# Patient Record
Sex: Female | Born: 1950 | Race: Black or African American | Hispanic: No | Smoking: Never smoker
Health system: Southern US, Community
[De-identification: ages and names within clinical notes are randomized; demographics above are authoritative.]

## PROBLEM LIST (undated history)

## (undated) DIAGNOSIS — K219 Gastro-esophageal reflux disease without esophagitis: Secondary | ICD-10-CM

## (undated) DIAGNOSIS — Z8669 Personal history of other diseases of the nervous system and sense organs: Secondary | ICD-10-CM

## (undated) DIAGNOSIS — Z9889 Other specified postprocedural states: Secondary | ICD-10-CM

## (undated) DIAGNOSIS — I10 Essential (primary) hypertension: Secondary | ICD-10-CM

## (undated) DIAGNOSIS — F419 Anxiety disorder, unspecified: Secondary | ICD-10-CM

## (undated) DIAGNOSIS — I672 Cerebral atherosclerosis: Secondary | ICD-10-CM

## (undated) DIAGNOSIS — I447 Left bundle-branch block, unspecified: Secondary | ICD-10-CM

## (undated) DIAGNOSIS — Z86018 Personal history of other benign neoplasm: Secondary | ICD-10-CM

## (undated) DIAGNOSIS — E041 Nontoxic single thyroid nodule: Secondary | ICD-10-CM

## (undated) DIAGNOSIS — I429 Cardiomyopathy, unspecified: Secondary | ICD-10-CM

## (undated) DIAGNOSIS — I209 Angina pectoris, unspecified: Secondary | ICD-10-CM

## (undated) DIAGNOSIS — Z9221 Personal history of antineoplastic chemotherapy: Secondary | ICD-10-CM

## (undated) DIAGNOSIS — G4730 Sleep apnea, unspecified: Secondary | ICD-10-CM

## (undated) DIAGNOSIS — E049 Nontoxic goiter, unspecified: Secondary | ICD-10-CM

## (undated) DIAGNOSIS — Z8742 Personal history of other diseases of the female genital tract: Secondary | ICD-10-CM

## (undated) DIAGNOSIS — Z780 Asymptomatic menopausal state: Secondary | ICD-10-CM

## (undated) DIAGNOSIS — Z95810 Presence of automatic (implantable) cardiac defibrillator: Secondary | ICD-10-CM

## (undated) DIAGNOSIS — M1990 Unspecified osteoarthritis, unspecified site: Secondary | ICD-10-CM

## (undated) DIAGNOSIS — R0600 Dyspnea, unspecified: Secondary | ICD-10-CM

## (undated) DIAGNOSIS — Z87442 Personal history of urinary calculi: Secondary | ICD-10-CM

## (undated) DIAGNOSIS — R112 Nausea with vomiting, unspecified: Secondary | ICD-10-CM

## (undated) DIAGNOSIS — J343 Hypertrophy of nasal turbinates: Secondary | ICD-10-CM

## (undated) DIAGNOSIS — J329 Chronic sinusitis, unspecified: Secondary | ICD-10-CM

## (undated) DIAGNOSIS — J189 Pneumonia, unspecified organism: Secondary | ICD-10-CM

## (undated) DIAGNOSIS — D649 Anemia, unspecified: Secondary | ICD-10-CM

## (undated) DIAGNOSIS — N393 Stress incontinence (female) (male): Secondary | ICD-10-CM

## (undated) DIAGNOSIS — D0511 Intraductal carcinoma in situ of right breast: Secondary | ICD-10-CM

## (undated) DIAGNOSIS — E785 Hyperlipidemia, unspecified: Secondary | ICD-10-CM

## (undated) DIAGNOSIS — C50912 Malignant neoplasm of unspecified site of left female breast: Secondary | ICD-10-CM

## (undated) DIAGNOSIS — E119 Type 2 diabetes mellitus without complications: Secondary | ICD-10-CM

## (undated) DIAGNOSIS — G5600 Carpal tunnel syndrome, unspecified upper limb: Secondary | ICD-10-CM

## (undated) DIAGNOSIS — Z8679 Personal history of other diseases of the circulatory system: Secondary | ICD-10-CM

## (undated) DIAGNOSIS — Z923 Personal history of irradiation: Secondary | ICD-10-CM

## (undated) HISTORY — DX: Intraductal carcinoma in situ of right breast: D05.11

## (undated) HISTORY — PX: ABDOMINAL HYSTERECTOMY: SHX81

## (undated) HISTORY — DX: Malignant neoplasm of unspecified site of left female breast: C50.912

## (undated) HISTORY — DX: Cerebral atherosclerosis: I67.2

## (undated) HISTORY — DX: Chronic sinusitis, unspecified: J32.9

## (undated) HISTORY — PX: EYE SURGERY: SHX253

## (undated) HISTORY — DX: Unspecified osteoarthritis, unspecified site: M19.90

## (undated) HISTORY — PX: CARDIAC CATHETERIZATION: SHX172

## (undated) HISTORY — DX: Stress incontinence (female) (male): N39.3

## (undated) HISTORY — PX: TUBAL LIGATION: SHX77

## (undated) HISTORY — DX: Hyperlipidemia, unspecified: E78.5

## (undated) HISTORY — PX: ROTATOR CUFF REPAIR: SHX139

## (undated) HISTORY — DX: Asymptomatic menopausal state: Z78.0

## (undated) HISTORY — PX: WISDOM TOOTH EXTRACTION: SHX21

## (undated) HISTORY — DX: Type 2 diabetes mellitus without complications: E11.9

## (undated) HISTORY — DX: Essential (primary) hypertension: I10

## (undated) HISTORY — PX: BILATERAL SALPINGOOPHORECTOMY: SHX1223

## (undated) HISTORY — DX: Left bundle-branch block, unspecified: I44.7

## (undated) HISTORY — DX: Cardiomyopathy, unspecified: I42.9

---

## 2000-09-14 ENCOUNTER — Emergency Department (HOSPITAL_COMMUNITY): Admission: EM | Admit: 2000-09-14 | Discharge: 2000-09-14 | Payer: Self-pay | Admitting: Emergency Medicine

## 2000-09-14 ENCOUNTER — Encounter: Payer: Self-pay | Admitting: Emergency Medicine

## 2000-09-23 ENCOUNTER — Encounter: Payer: Self-pay | Admitting: Obstetrics and Gynecology

## 2000-09-23 ENCOUNTER — Encounter: Admission: RE | Admit: 2000-09-23 | Discharge: 2000-09-23 | Payer: Self-pay | Admitting: Obstetrics and Gynecology

## 2003-06-16 ENCOUNTER — Encounter: Admission: RE | Admit: 2003-06-16 | Discharge: 2003-06-16 | Payer: Self-pay | Admitting: Internal Medicine

## 2003-06-16 ENCOUNTER — Encounter: Payer: Self-pay | Admitting: Internal Medicine

## 2004-10-22 HISTORY — PX: BREAST LUMPECTOMY: SHX2

## 2005-02-02 ENCOUNTER — Encounter: Admission: RE | Admit: 2005-02-02 | Discharge: 2005-02-02 | Payer: Self-pay | Admitting: Internal Medicine

## 2005-02-02 ENCOUNTER — Encounter (INDEPENDENT_AMBULATORY_CARE_PROVIDER_SITE_OTHER): Payer: Self-pay | Admitting: *Deleted

## 2005-02-02 ENCOUNTER — Encounter (INDEPENDENT_AMBULATORY_CARE_PROVIDER_SITE_OTHER): Payer: Self-pay | Admitting: Radiology

## 2005-02-11 ENCOUNTER — Encounter: Admission: RE | Admit: 2005-02-11 | Discharge: 2005-02-11 | Payer: Self-pay | Admitting: General Surgery

## 2005-02-13 ENCOUNTER — Ambulatory Visit: Payer: Self-pay | Admitting: Oncology

## 2005-02-22 ENCOUNTER — Ambulatory Visit (HOSPITAL_BASED_OUTPATIENT_CLINIC_OR_DEPARTMENT_OTHER): Admission: RE | Admit: 2005-02-22 | Discharge: 2005-02-22 | Payer: Self-pay | Admitting: General Surgery

## 2005-02-22 ENCOUNTER — Ambulatory Visit (HOSPITAL_COMMUNITY): Admission: RE | Admit: 2005-02-22 | Discharge: 2005-02-22 | Payer: Self-pay | Admitting: General Surgery

## 2005-02-23 ENCOUNTER — Ambulatory Visit (HOSPITAL_COMMUNITY): Admission: RE | Admit: 2005-02-23 | Discharge: 2005-02-23 | Payer: Self-pay | Admitting: Oncology

## 2005-02-28 ENCOUNTER — Ambulatory Visit: Payer: Self-pay

## 2005-04-04 ENCOUNTER — Encounter: Admission: RE | Admit: 2005-04-04 | Discharge: 2005-04-04 | Payer: Self-pay | Admitting: Oncology

## 2005-04-04 ENCOUNTER — Ambulatory Visit: Payer: Self-pay | Admitting: Oncology

## 2005-04-25 ENCOUNTER — Encounter: Admission: RE | Admit: 2005-04-25 | Discharge: 2005-04-25 | Payer: Self-pay | Admitting: Oncology

## 2005-05-24 ENCOUNTER — Ambulatory Visit: Payer: Self-pay | Admitting: Oncology

## 2005-07-05 ENCOUNTER — Encounter: Admission: RE | Admit: 2005-07-05 | Discharge: 2005-07-05 | Payer: Self-pay | Admitting: Oncology

## 2005-08-02 ENCOUNTER — Encounter: Admission: RE | Admit: 2005-08-02 | Discharge: 2005-08-02 | Payer: Self-pay | Admitting: General Surgery

## 2005-08-03 ENCOUNTER — Ambulatory Visit (HOSPITAL_BASED_OUTPATIENT_CLINIC_OR_DEPARTMENT_OTHER): Admission: RE | Admit: 2005-08-03 | Discharge: 2005-08-03 | Payer: Self-pay | Admitting: General Surgery

## 2005-08-03 ENCOUNTER — Ambulatory Visit: Payer: Self-pay | Admitting: Oncology

## 2005-08-03 ENCOUNTER — Ambulatory Visit (HOSPITAL_COMMUNITY): Admission: RE | Admit: 2005-08-03 | Discharge: 2005-08-03 | Payer: Self-pay | Admitting: General Surgery

## 2005-08-03 ENCOUNTER — Encounter: Admission: RE | Admit: 2005-08-03 | Discharge: 2005-08-03 | Payer: Self-pay | Admitting: General Surgery

## 2005-08-03 ENCOUNTER — Encounter (INDEPENDENT_AMBULATORY_CARE_PROVIDER_SITE_OTHER): Payer: Self-pay | Admitting: *Deleted

## 2005-09-12 ENCOUNTER — Ambulatory Visit: Admission: RE | Admit: 2005-09-12 | Discharge: 2005-12-06 | Payer: Self-pay | Admitting: Radiation Oncology

## 2005-09-20 ENCOUNTER — Ambulatory Visit (HOSPITAL_BASED_OUTPATIENT_CLINIC_OR_DEPARTMENT_OTHER): Admission: RE | Admit: 2005-09-20 | Discharge: 2005-09-20 | Payer: Self-pay | Admitting: General Surgery

## 2005-11-14 ENCOUNTER — Ambulatory Visit: Payer: Self-pay | Admitting: Oncology

## 2006-02-04 ENCOUNTER — Encounter: Admission: RE | Admit: 2006-02-04 | Discharge: 2006-02-04 | Payer: Self-pay | Admitting: Oncology

## 2006-02-19 ENCOUNTER — Ambulatory Visit: Payer: Self-pay | Admitting: Oncology

## 2006-02-21 LAB — CBC WITH DIFFERENTIAL/PLATELET
BASO%: 0.3 % (ref 0.0–2.0)
Eosinophils Absolute: 0.1 10*3/uL (ref 0.0–0.5)
HCT: 39.3 % (ref 34.8–46.6)
MCHC: 34.5 g/dL (ref 32.0–36.0)
MONO#: 0.4 10*3/uL (ref 0.1–0.9)
NEUT#: 1.5 10*3/uL (ref 1.5–6.5)
NEUT%: 44.5 % (ref 39.6–76.8)
WBC: 3.3 10*3/uL — ABNORMAL LOW (ref 3.9–10.0)
lymph#: 1.3 10*3/uL (ref 0.9–3.3)

## 2006-02-21 LAB — COMPREHENSIVE METABOLIC PANEL
ALT: 21 U/L (ref 0–40)
CO2: 27 mEq/L (ref 19–32)
Calcium: 9.9 mg/dL (ref 8.4–10.5)
Chloride: 105 mEq/L (ref 96–112)
Creatinine, Ser: 0.7 mg/dL (ref 0.4–1.2)
Glucose, Bld: 113 mg/dL — ABNORMAL HIGH (ref 70–99)
Sodium: 141 mEq/L (ref 135–145)
Total Protein: 7.5 g/dL (ref 6.0–8.3)

## 2006-02-21 LAB — CANCER ANTIGEN 27.29: CA 27.29: 29 U/mL (ref 0–39)

## 2006-02-21 LAB — LACTATE DEHYDROGENASE: LDH: 138 U/L (ref 94–250)

## 2006-06-18 ENCOUNTER — Ambulatory Visit: Payer: Self-pay | Admitting: Oncology

## 2006-06-20 LAB — CBC WITH DIFFERENTIAL/PLATELET
BASO%: 0.3 % (ref 0.0–2.0)
Basophils Absolute: 0 10*3/uL (ref 0.0–0.1)
Eosinophils Absolute: 0.1 10*3/uL (ref 0.0–0.5)
HCT: 39.6 % (ref 34.8–46.6)
HGB: 13.5 g/dL (ref 11.6–15.9)
MONO#: 0.4 10*3/uL (ref 0.1–0.9)
NEUT#: 2.2 10*3/uL (ref 1.5–6.5)
NEUT%: 51.7 % (ref 39.6–76.8)
WBC: 4.2 10*3/uL (ref 3.9–10.0)
lymph#: 1.5 10*3/uL (ref 0.9–3.3)

## 2006-06-20 LAB — COMPREHENSIVE METABOLIC PANEL
Alkaline Phosphatase: 103 U/L (ref 39–117)
BUN: 6 mg/dL (ref 6–23)
CO2: 25 mEq/L (ref 19–32)
Creatinine, Ser: 0.69 mg/dL (ref 0.40–1.20)
Glucose, Bld: 109 mg/dL — ABNORMAL HIGH (ref 70–99)
Sodium: 141 mEq/L (ref 135–145)
Total Bilirubin: 0.5 mg/dL (ref 0.3–1.2)
Total Protein: 7.8 g/dL (ref 6.0–8.3)

## 2006-06-20 LAB — CANCER ANTIGEN 27.29: CA 27.29: 27 U/mL (ref 0–39)

## 2006-07-30 ENCOUNTER — Ambulatory Visit (HOSPITAL_COMMUNITY): Admission: RE | Admit: 2006-07-30 | Discharge: 2006-07-30 | Payer: Self-pay | Admitting: Oncology

## 2006-08-23 ENCOUNTER — Ambulatory Visit (HOSPITAL_BASED_OUTPATIENT_CLINIC_OR_DEPARTMENT_OTHER): Admission: RE | Admit: 2006-08-23 | Discharge: 2006-08-23 | Payer: Self-pay | Admitting: Orthopedic Surgery

## 2006-11-26 ENCOUNTER — Ambulatory Visit: Payer: Self-pay | Admitting: Oncology

## 2006-11-29 LAB — CBC WITH DIFFERENTIAL/PLATELET
Basophils Absolute: 0 10*3/uL (ref 0.0–0.1)
EOS%: 1.9 % (ref 0.0–7.0)
HCT: 41.1 % (ref 34.8–46.6)
HGB: 14.6 g/dL (ref 11.6–15.9)
MCH: 30.9 pg (ref 26.0–34.0)
NEUT%: 40.9 % (ref 39.6–76.8)
lymph#: 1.7 10*3/uL (ref 0.9–3.3)

## 2006-11-29 LAB — COMPREHENSIVE METABOLIC PANEL
AST: 22 U/L (ref 0–37)
BUN: 4 mg/dL — ABNORMAL LOW (ref 6–23)
CO2: 26 mEq/L (ref 19–32)
Calcium: 10.1 mg/dL (ref 8.4–10.5)
Chloride: 102 mEq/L (ref 96–112)
Creatinine, Ser: 0.61 mg/dL (ref 0.40–1.20)

## 2006-11-29 LAB — CANCER ANTIGEN 27.29: CA 27.29: 34 U/mL (ref 0–39)

## 2006-11-29 LAB — LACTATE DEHYDROGENASE: LDH: 136 U/L (ref 94–250)

## 2007-01-23 ENCOUNTER — Emergency Department (HOSPITAL_COMMUNITY): Admission: EM | Admit: 2007-01-23 | Discharge: 2007-01-23 | Payer: Self-pay | Admitting: Family Medicine

## 2007-02-06 ENCOUNTER — Encounter: Admission: RE | Admit: 2007-02-06 | Discharge: 2007-02-06 | Payer: Self-pay | Admitting: Oncology

## 2007-02-26 ENCOUNTER — Ambulatory Visit: Payer: Self-pay | Admitting: Oncology

## 2007-06-18 ENCOUNTER — Ambulatory Visit: Payer: Self-pay | Admitting: Oncology

## 2007-06-20 LAB — CBC WITH DIFFERENTIAL/PLATELET
Eosinophils Absolute: 0.1 10*3/uL (ref 0.0–0.5)
MONO#: 0.3 10*3/uL (ref 0.1–0.9)
NEUT#: 2.3 10*3/uL (ref 1.5–6.5)
RBC: 4.72 10*6/uL (ref 3.70–5.32)
RDW: 12.5 % (ref 11.3–14.5)
WBC: 4.8 10*3/uL (ref 3.9–10.0)
lymph#: 2.1 10*3/uL (ref 0.9–3.3)

## 2007-06-20 LAB — COMPREHENSIVE METABOLIC PANEL
ALT: 13 U/L (ref 0–35)
CO2: 25 mEq/L (ref 19–32)
Calcium: 10.1 mg/dL (ref 8.4–10.5)
Chloride: 101 mEq/L (ref 96–112)
Potassium: 4.1 mEq/L (ref 3.5–5.3)
Sodium: 136 mEq/L (ref 135–145)
Total Protein: 7.9 g/dL (ref 6.0–8.3)

## 2007-06-20 LAB — LACTATE DEHYDROGENASE: LDH: 123 U/L (ref 94–250)

## 2007-07-21 ENCOUNTER — Ambulatory Visit: Payer: Self-pay | Admitting: Internal Medicine

## 2007-07-21 ENCOUNTER — Ambulatory Visit: Payer: Self-pay | Admitting: *Deleted

## 2007-10-14 ENCOUNTER — Ambulatory Visit: Payer: Self-pay | Admitting: Oncology

## 2007-10-23 HISTORY — PX: BREAST LUMPECTOMY: SHX2

## 2007-10-23 HISTORY — PX: BREAST BIOPSY: SHX20

## 2007-11-05 ENCOUNTER — Ambulatory Visit: Payer: Self-pay | Admitting: Internal Medicine

## 2007-11-12 LAB — COMPREHENSIVE METABOLIC PANEL
AST: 14 U/L (ref 0–37)
Albumin: 4.7 g/dL (ref 3.5–5.2)
Alkaline Phosphatase: 103 U/L (ref 39–117)
BUN: 10 mg/dL (ref 6–23)
Potassium: 3.8 mEq/L (ref 3.5–5.3)
Total Bilirubin: 0.3 mg/dL (ref 0.3–1.2)

## 2007-11-12 LAB — CBC WITH DIFFERENTIAL/PLATELET
Basophils Absolute: 0 10*3/uL (ref 0.0–0.1)
EOS%: 3 % (ref 0.0–7.0)
HGB: 14 g/dL (ref 11.6–15.9)
LYMPH%: 47.4 % (ref 14.0–48.0)
MCH: 30.2 pg (ref 26.0–34.0)
MCV: 86.2 fL (ref 81.0–101.0)
MONO%: 8.4 % (ref 0.0–13.0)
Platelets: 292 10*3/uL (ref 145–400)
RBC: 4.65 10*6/uL (ref 3.70–5.32)
RDW: 12.7 % (ref 11.3–14.5)

## 2007-11-12 LAB — CANCER ANTIGEN 27.29: CA 27.29: 32 U/mL (ref 0–39)

## 2008-02-13 ENCOUNTER — Encounter: Admission: RE | Admit: 2008-02-13 | Discharge: 2008-02-13 | Payer: Self-pay | Admitting: Oncology

## 2008-02-16 ENCOUNTER — Encounter: Admission: RE | Admit: 2008-02-16 | Discharge: 2008-02-16 | Payer: Self-pay | Admitting: Oncology

## 2008-02-27 ENCOUNTER — Encounter: Admission: RE | Admit: 2008-02-27 | Discharge: 2008-02-27 | Payer: Self-pay | Admitting: Oncology

## 2008-02-27 ENCOUNTER — Encounter (INDEPENDENT_AMBULATORY_CARE_PROVIDER_SITE_OTHER): Payer: Self-pay | Admitting: Diagnostic Radiology

## 2008-03-09 ENCOUNTER — Ambulatory Visit: Payer: Self-pay | Admitting: Oncology

## 2008-03-11 LAB — COMPREHENSIVE METABOLIC PANEL
AST: 15 U/L (ref 0–37)
Alkaline Phosphatase: 94 U/L (ref 39–117)
BUN: 14 mg/dL (ref 6–23)
Glucose, Bld: 129 mg/dL — ABNORMAL HIGH (ref 70–99)
Total Bilirubin: 0.4 mg/dL (ref 0.3–1.2)

## 2008-03-11 LAB — CBC WITH DIFFERENTIAL/PLATELET
Basophils Absolute: 0 10*3/uL (ref 0.0–0.1)
EOS%: 1.7 % (ref 0.0–7.0)
Eosinophils Absolute: 0.1 10*3/uL (ref 0.0–0.5)
HGB: 13.9 g/dL (ref 11.6–15.9)
LYMPH%: 49.5 % — ABNORMAL HIGH (ref 14.0–48.0)
MCH: 29.9 pg (ref 26.0–34.0)
MCV: 86.7 fL (ref 81.0–101.0)
MONO%: 6.7 % (ref 0.0–13.0)
NEUT#: 1.8 10*3/uL (ref 1.5–6.5)
Platelets: 280 10*3/uL (ref 145–400)
RBC: 4.63 10*6/uL (ref 3.70–5.32)

## 2008-03-11 LAB — CANCER ANTIGEN 27.29: CA 27.29: 38 U/mL (ref 0–39)

## 2008-03-19 ENCOUNTER — Encounter: Admission: RE | Admit: 2008-03-19 | Discharge: 2008-03-19 | Payer: Self-pay | Admitting: General Surgery

## 2008-03-22 ENCOUNTER — Ambulatory Visit (HOSPITAL_BASED_OUTPATIENT_CLINIC_OR_DEPARTMENT_OTHER): Admission: RE | Admit: 2008-03-22 | Discharge: 2008-03-22 | Payer: Self-pay | Admitting: General Surgery

## 2008-03-22 ENCOUNTER — Encounter (HOSPITAL_BASED_OUTPATIENT_CLINIC_OR_DEPARTMENT_OTHER): Payer: Self-pay | Admitting: General Surgery

## 2008-03-22 ENCOUNTER — Encounter: Admission: RE | Admit: 2008-03-22 | Discharge: 2008-03-22 | Payer: Self-pay | Admitting: General Surgery

## 2008-04-15 ENCOUNTER — Ambulatory Visit: Admission: RE | Admit: 2008-04-15 | Discharge: 2008-07-13 | Payer: Self-pay | Admitting: Radiation Oncology

## 2008-04-28 ENCOUNTER — Ambulatory Visit: Payer: Self-pay | Admitting: Oncology

## 2008-06-22 ENCOUNTER — Ambulatory Visit: Payer: Self-pay | Admitting: Oncology

## 2008-06-22 LAB — CBC WITH DIFFERENTIAL/PLATELET
Basophils Absolute: 0 10*3/uL (ref 0.0–0.1)
EOS%: 2.8 % (ref 0.0–7.0)
HCT: 39.8 % (ref 34.8–46.6)
HGB: 13.9 g/dL (ref 11.6–15.9)
MCH: 30.6 pg (ref 26.0–34.0)
MONO#: 0.3 10*3/uL (ref 0.1–0.9)
NEUT#: 1.4 10*3/uL — ABNORMAL LOW (ref 1.5–6.5)
NEUT%: 57.2 % (ref 39.6–76.8)
RDW: 12.5 % (ref 11.3–14.5)
WBC: 2.5 10*3/uL — ABNORMAL LOW (ref 3.9–10.0)
lymph#: 0.6 10*3/uL — ABNORMAL LOW (ref 0.9–3.3)

## 2008-06-23 LAB — COMPREHENSIVE METABOLIC PANEL
ALT: 17 U/L (ref 0–35)
AST: 16 U/L (ref 0–37)
Albumin: 4.6 g/dL (ref 3.5–5.2)
BUN: 7 mg/dL (ref 6–23)
CO2: 23 mEq/L (ref 19–32)
Calcium: 9.6 mg/dL (ref 8.4–10.5)
Chloride: 104 mEq/L (ref 96–112)
Creatinine, Ser: 0.61 mg/dL (ref 0.40–1.20)
Potassium: 4.1 mEq/L (ref 3.5–5.3)

## 2008-06-23 LAB — VITAMIN D 25 HYDROXY (VIT D DEFICIENCY, FRACTURES): Vit D, 25-Hydroxy: 24 ng/mL — ABNORMAL LOW (ref 30–89)

## 2008-06-23 LAB — LACTATE DEHYDROGENASE: LDH: 132 U/L (ref 94–250)

## 2008-06-25 ENCOUNTER — Ambulatory Visit (HOSPITAL_COMMUNITY): Admission: RE | Admit: 2008-06-25 | Discharge: 2008-06-25 | Payer: Self-pay | Admitting: Obstetrics and Gynecology

## 2008-06-25 ENCOUNTER — Encounter (INDEPENDENT_AMBULATORY_CARE_PROVIDER_SITE_OTHER): Payer: Self-pay | Admitting: Obstetrics and Gynecology

## 2008-07-14 ENCOUNTER — Encounter: Admission: RE | Admit: 2008-07-14 | Discharge: 2008-07-14 | Payer: Self-pay | Admitting: Oncology

## 2008-08-09 ENCOUNTER — Emergency Department (HOSPITAL_COMMUNITY): Admission: EM | Admit: 2008-08-09 | Discharge: 2008-08-09 | Payer: Self-pay | Admitting: Emergency Medicine

## 2008-10-08 ENCOUNTER — Ambulatory Visit: Payer: Self-pay | Admitting: Oncology

## 2008-10-18 LAB — CBC WITH DIFFERENTIAL/PLATELET
BASO%: 0.4 % (ref 0.0–2.0)
EOS%: 4.9 % (ref 0.0–7.0)
HCT: 39.7 % (ref 34.8–46.6)
LYMPH%: 33.4 % (ref 14.0–48.0)
MCH: 30.6 pg (ref 26.0–34.0)
MCHC: 34.5 g/dL (ref 32.0–36.0)
MONO#: 0.4 10*3/uL (ref 0.1–0.9)
MONO%: 10.4 % (ref 0.0–13.0)
NEUT%: 50.9 % (ref 39.6–76.8)
Platelets: 291 10*3/uL (ref 145–400)
RBC: 4.48 10*6/uL (ref 3.70–5.32)
WBC: 3.7 10*3/uL — ABNORMAL LOW (ref 3.9–10.0)

## 2008-10-19 LAB — LACTATE DEHYDROGENASE: LDH: 136 U/L (ref 94–250)

## 2008-10-19 LAB — COMPREHENSIVE METABOLIC PANEL
ALT: 31 U/L (ref 0–35)
AST: 22 U/L (ref 0–37)
Alkaline Phosphatase: 101 U/L (ref 39–117)
CO2: 27 mEq/L (ref 19–32)
Creatinine, Ser: 0.64 mg/dL (ref 0.40–1.20)
Sodium: 138 mEq/L (ref 135–145)
Total Bilirubin: 0.4 mg/dL (ref 0.3–1.2)
Total Protein: 7.7 g/dL (ref 6.0–8.3)

## 2008-10-19 LAB — VITAMIN D 25 HYDROXY (VIT D DEFICIENCY, FRACTURES): Vit D, 25-Hydroxy: 25 ng/mL — ABNORMAL LOW (ref 30–89)

## 2008-11-09 ENCOUNTER — Encounter (INDEPENDENT_AMBULATORY_CARE_PROVIDER_SITE_OTHER): Payer: Self-pay | Admitting: Internal Medicine

## 2008-11-09 ENCOUNTER — Ambulatory Visit: Payer: Self-pay | Admitting: Internal Medicine

## 2008-11-09 LAB — CONVERTED CEMR LAB
ALT: 14 units/L (ref 0–35)
AST: 15 units/L (ref 0–37)
Albumin: 4.9 g/dL (ref 3.5–5.2)
CO2: 32 meq/L (ref 19–32)
Calcium: 10.6 mg/dL — ABNORMAL HIGH (ref 8.4–10.5)
Chloride: 104 meq/L (ref 96–112)
Potassium: 4.2 meq/L (ref 3.5–5.3)
Sodium: 143 meq/L (ref 135–145)
Total Protein: 8.2 g/dL (ref 6.0–8.3)

## 2009-02-15 ENCOUNTER — Encounter: Admission: RE | Admit: 2009-02-15 | Discharge: 2009-02-15 | Payer: Self-pay | Admitting: Oncology

## 2009-02-16 ENCOUNTER — Ambulatory Visit (HOSPITAL_COMMUNITY): Admission: RE | Admit: 2009-02-16 | Discharge: 2009-02-16 | Payer: Self-pay | Admitting: Oncology

## 2009-04-26 ENCOUNTER — Ambulatory Visit: Payer: Self-pay | Admitting: Oncology

## 2009-04-28 LAB — CBC WITH DIFFERENTIAL/PLATELET
BASO%: 0.5 % (ref 0.0–2.0)
Basophils Absolute: 0 10*3/uL (ref 0.0–0.1)
EOS%: 3.4 % (ref 0.0–7.0)
HGB: 13.3 g/dL (ref 11.6–15.9)
MCH: 29.4 pg (ref 25.1–34.0)
MCHC: 33.3 g/dL (ref 31.5–36.0)
MCV: 88.1 fL (ref 79.5–101.0)
MONO%: 10.1 % (ref 0.0–14.0)
RBC: 4.53 10*6/uL (ref 3.70–5.45)
RDW: 12.6 % (ref 11.2–14.5)
WBC: 4.4 10*3/uL (ref 3.9–10.3)
lymph#: 1.6 10*3/uL (ref 0.9–3.3)
nRBC: 0 % (ref 0–0)

## 2009-04-29 LAB — CANCER ANTIGEN 27.29: CA 27.29: 35 U/mL (ref 0–39)

## 2009-04-29 LAB — COMPREHENSIVE METABOLIC PANEL
ALT: 15 U/L (ref 0–35)
Alkaline Phosphatase: 91 U/L (ref 39–117)
CO2: 24 mEq/L (ref 19–32)
Creatinine, Ser: 0.67 mg/dL (ref 0.40–1.20)
Sodium: 138 mEq/L (ref 135–145)
Total Bilirubin: 0.3 mg/dL (ref 0.3–1.2)
Total Protein: 7.8 g/dL (ref 6.0–8.3)

## 2009-08-24 ENCOUNTER — Ambulatory Visit: Payer: Self-pay | Admitting: Oncology

## 2009-08-31 ENCOUNTER — Encounter: Admission: RE | Admit: 2009-08-31 | Discharge: 2009-08-31 | Payer: Self-pay | Admitting: Oncology

## 2009-10-22 HISTORY — PX: COLONOSCOPY: SHX174

## 2009-12-02 ENCOUNTER — Ambulatory Visit: Payer: Self-pay | Admitting: Oncology

## 2009-12-06 LAB — CBC WITH DIFFERENTIAL/PLATELET
Eosinophils Absolute: 0.1 10*3/uL (ref 0.0–0.5)
HCT: 40.6 % (ref 34.8–46.6)
LYMPH%: 45.6 % (ref 14.0–49.7)
MCV: 89.5 fL (ref 79.5–101.0)
MONO#: 0.4 10*3/uL (ref 0.1–0.9)
MONO%: 6.6 % (ref 0.0–14.0)
NEUT#: 2.9 10*3/uL (ref 1.5–6.5)
NEUT%: 45.9 % (ref 38.4–76.8)
Platelets: 267 10*3/uL (ref 145–400)
WBC: 6.2 10*3/uL (ref 3.9–10.3)

## 2009-12-06 LAB — COMPREHENSIVE METABOLIC PANEL
BUN: 10 mg/dL (ref 6–23)
CO2: 28 mEq/L (ref 19–32)
Creatinine, Ser: 0.62 mg/dL (ref 0.40–1.20)
Glucose, Bld: 107 mg/dL — ABNORMAL HIGH (ref 70–99)
Total Bilirubin: 0.5 mg/dL (ref 0.3–1.2)

## 2009-12-06 LAB — VITAMIN D 25 HYDROXY (VIT D DEFICIENCY, FRACTURES): Vit D, 25-Hydroxy: 29 ng/mL — ABNORMAL LOW (ref 30–89)

## 2009-12-06 LAB — CANCER ANTIGEN 27.29: CA 27.29: 30 U/mL (ref 0–39)

## 2009-12-06 LAB — LACTATE DEHYDROGENASE: LDH: 128 U/L (ref 94–250)

## 2010-01-13 ENCOUNTER — Ambulatory Visit: Payer: Self-pay | Admitting: Oncology

## 2010-05-22 ENCOUNTER — Encounter: Admission: RE | Admit: 2010-05-22 | Discharge: 2010-05-22 | Payer: Self-pay | Admitting: Podiatry

## 2010-06-09 ENCOUNTER — Ambulatory Visit (HOSPITAL_COMMUNITY): Admission: RE | Admit: 2010-06-09 | Discharge: 2010-06-09 | Payer: Self-pay | Admitting: Obstetrics and Gynecology

## 2010-07-13 ENCOUNTER — Ambulatory Visit: Payer: Self-pay | Admitting: Oncology

## 2010-07-14 ENCOUNTER — Encounter: Admission: RE | Admit: 2010-07-14 | Discharge: 2010-07-14 | Payer: Self-pay | Admitting: Oncology

## 2010-07-17 LAB — CBC WITH DIFFERENTIAL/PLATELET
BASO%: 0.7 % (ref 0.0–2.0)
Basophils Absolute: 0 10e3/uL (ref 0.0–0.1)
EOS%: 2.5 % (ref 0.0–7.0)
Eosinophils Absolute: 0.1 10e3/uL (ref 0.0–0.5)
HCT: 38.5 % (ref 34.8–46.6)
HGB: 13.4 g/dL (ref 11.6–15.9)
LYMPH%: 48.7 % (ref 14.0–49.7)
MCH: 30.9 pg (ref 25.1–34.0)
MCHC: 34.8 g/dL (ref 31.5–36.0)
MCV: 88.9 fL (ref 79.5–101.0)
MONO#: 0.5 10e3/uL (ref 0.1–0.9)
MONO%: 9.9 % (ref 0.0–14.0)
NEUT#: 2 10e3/uL (ref 1.5–6.5)
NEUT%: 38.2 % — ABNORMAL LOW (ref 38.4–76.8)
Platelets: 277 10e3/uL (ref 145–400)
RBC: 4.33 10e6/uL (ref 3.70–5.45)
RDW: 12.7 % (ref 11.2–14.5)
WBC: 5.1 10e3/uL (ref 3.9–10.3)
lymph#: 2.5 10e3/uL (ref 0.9–3.3)

## 2010-07-18 LAB — COMPREHENSIVE METABOLIC PANEL
ALT: 13 U/L (ref 0–35)
AST: 16 U/L (ref 0–37)
CO2: 22 mEq/L (ref 19–32)
Calcium: 9.7 mg/dL (ref 8.4–10.5)
Chloride: 107 mEq/L (ref 96–112)
Creatinine, Ser: 0.54 mg/dL (ref 0.40–1.20)
Sodium: 141 mEq/L (ref 135–145)
Total Bilirubin: 0.3 mg/dL (ref 0.3–1.2)
Total Protein: 7.1 g/dL (ref 6.0–8.3)

## 2010-09-04 ENCOUNTER — Encounter: Admission: RE | Admit: 2010-09-04 | Discharge: 2010-09-04 | Payer: Self-pay | Admitting: Oncology

## 2010-10-30 ENCOUNTER — Ambulatory Visit: Payer: Self-pay | Admitting: Oncology

## 2010-11-11 ENCOUNTER — Encounter: Payer: Self-pay | Admitting: Oncology

## 2010-11-12 ENCOUNTER — Encounter: Payer: Self-pay | Admitting: Oncology

## 2011-01-05 LAB — CBC
MCH: 30.8 pg (ref 26.0–34.0)
Platelets: 245 10*3/uL (ref 150–400)
RBC: 4.45 MIL/uL (ref 3.87–5.11)
WBC: 5.3 10*3/uL (ref 4.0–10.5)

## 2011-01-19 ENCOUNTER — Encounter (HOSPITAL_BASED_OUTPATIENT_CLINIC_OR_DEPARTMENT_OTHER): Payer: 59 | Admitting: Oncology

## 2011-01-19 ENCOUNTER — Other Ambulatory Visit: Payer: Self-pay | Admitting: Physician Assistant

## 2011-01-19 DIAGNOSIS — Z170 Estrogen receptor positive status [ER+]: Secondary | ICD-10-CM

## 2011-01-19 DIAGNOSIS — D0590 Unspecified type of carcinoma in situ of unspecified breast: Secondary | ICD-10-CM

## 2011-01-19 LAB — CBC WITH DIFFERENTIAL/PLATELET
BASO%: 0.2 % (ref 0.0–2.0)
HCT: 39.6 % (ref 34.8–46.6)
MCHC: 33.1 g/dL (ref 31.5–36.0)
MONO#: 0.3 10*3/uL (ref 0.1–0.9)
NEUT#: 2 10*3/uL (ref 1.5–6.5)
NEUT%: 43.8 % (ref 38.4–76.8)
WBC: 4.5 10*3/uL (ref 3.9–10.3)
lymph#: 2.1 10*3/uL (ref 0.9–3.3)

## 2011-01-20 LAB — COMPREHENSIVE METABOLIC PANEL
ALT: 18 U/L (ref 0–35)
CO2: 24 mEq/L (ref 19–32)
Calcium: 9.5 mg/dL (ref 8.4–10.5)
Chloride: 104 mEq/L (ref 96–112)
Creatinine, Ser: 0.82 mg/dL (ref 0.40–1.20)
Sodium: 140 mEq/L (ref 135–145)
Total Protein: 7.7 g/dL (ref 6.0–8.3)

## 2011-01-20 LAB — LACTATE DEHYDROGENASE: LDH: 123 U/L (ref 94–250)

## 2011-01-22 ENCOUNTER — Other Ambulatory Visit: Payer: Self-pay | Admitting: Oncology

## 2011-01-22 ENCOUNTER — Encounter (HOSPITAL_BASED_OUTPATIENT_CLINIC_OR_DEPARTMENT_OTHER): Payer: 59 | Admitting: Oncology

## 2011-01-22 DIAGNOSIS — Z9889 Other specified postprocedural states: Secondary | ICD-10-CM

## 2011-01-22 DIAGNOSIS — C50219 Malignant neoplasm of upper-inner quadrant of unspecified female breast: Secondary | ICD-10-CM

## 2011-01-22 DIAGNOSIS — C50419 Malignant neoplasm of upper-outer quadrant of unspecified female breast: Secondary | ICD-10-CM

## 2011-01-22 DIAGNOSIS — D0590 Unspecified type of carcinoma in situ of unspecified breast: Secondary | ICD-10-CM

## 2011-03-06 NOTE — Op Note (Signed)
NAME:  Jordan Willis, GRUPP                  ACCOUNT NO.:  [id number]   MEDICAL RECORD NO.:  [id number]          PATIENT TYPE:  AMB   LOCATION:  SDC                           FACILITY:  [facility]   PHYSICIAN:  Michelle L. Grewal, M.D.DATE OF BIRTH:  Dec 06, 1950   DATE OF PROCEDURE:  06/25/2008  DATE OF DISCHARGE:  06/25/2008                               OPERATIVE REPORT   PREOPERATIVE DIAGNOSES:  1. History of breast cancer.  2. BRCA2 mutation positive.  3. Desires prophylactic oophorectomy.   POSTOPERATIVE DIAGNOSES:  1. History of breast cancer.  2. BRCA2 mutation positive.  3. Desires prophylactic oophorectomy.   PROCEDURE:  Laparoscopic bilateral salpingo-oophorectomy.   SURGEON:  Michelle L. Vincente Poli, MD   ANESTHESIA:  General.   ESTIMATED BLOOD LOSS:  Less than 100 mL   PROCEDURE:  The patient was taken to the operating room after informed  consent was obtained.  She was prepped and draped in usual sterile  fashion after she was intubated.  The bladder was emptied with a Foley  catheter.  An uterine manipulator was inserted.  Attention was turned to  the abdomen where a small infraumbilical incision was made with a  scalpel.  Veress needle was inserted, pneumoperitoneum was performed.  An 11-mm trocar was inserted.  Under direct visualization, a 5 mm trocar  was inserted suprapubically.  Exam of the abdomen and pelvis revealed  the following.  She had much enlarged uterus, which we knew by  preoperative ultrasound.  Her ovaries were deep in the pelvis and there  were adhesions over both of them.  I proceeded on the right side in the  following fashion by identifying the round ligament going over findings  the triple pedicle, elevating the right fallopian tube, and visualized  in the right ovary.  I freed some of the adhesions, which involved the  ovary to the fibroid posteriorly using sharp and blunt dissection.  I  was then able to free the ovary, then elevated the ovary, identified  the  ureter, which was deep in the pelvis.  I placed a gyrus instrument  across the infundibular pelvic ligament on the right side and carried it  down all the way across the mesosalpinx over to the triple pedicle.  Once the ovary and tube were released, I noted there was no bleeding and  normal ureteral peristalsis was noted.  This was done in identical  fashion on the left side.  There were likewise adhesions as well that we  had to free up before we could remove the ovary and tube.  An Endobag  was placed through the suprapubic site which was then enlarged to fit a  10 and the specimens were removed with Endobag.  The pneumoperitoneum  was released after an excellent hemostasis was noted.  The incisions  were closed with Dermabond skin adhesive.  All sponge, lap, and  instrument counts were correct x2.  The patient was extubated without  difficulty, went to recovery room in stable condition.   PATHOLOGY:  Bilateral ovaries and tubes.   COMPLICATIONS:  None.  Michelle L. Vincente Poli, M.D.  Electronically Signed     MLG/MEDQ  D:  07/26/2008  T:  07/26/2008  Job:  [id number]

## 2011-03-06 NOTE — Op Note (Signed)
NAME:  MADELYN, TLATELPA                  ACCOUNT NO.:  [id number]   MEDICAL RECORD NO.:  [id number]          PATIENT TYPE:  AMB   LOCATION:  DSC                          FACILITY:  [facility]   PHYSICIAN:  Leonie Man, M.D.   DATE OF BIRTH:  Mar 22, 1951   DATE OF PROCEDURE:  03/22/2008  DATE OF DISCHARGE:                               OPERATIVE REPORT   PREOPERATIVE DIAGNOSIS:  Ductal carcinoma in situ right breast.   POSTOPERATIVE DIAGNOSIS:  Ductal carcinoma in situ right breast.   PROCEDURE:  Needle localized lumpectomy of the right breast.   SURGEON:  Leonie Man, MD   ASSISTANT:  OR tech.   ANESTHESIA:  General.   SPECIMEN TO THE LAB:  Right breast tissue.   ESTIMATED BLOOD LOSS:  Minimal.   COMPLICATIONS:  None.   The patient returned to the PACU in excellent condition.   The patient is a 59 year old female who previously had a left-sided  breast carcinoma which was excised in the remote past.  She now has a  right-sided breast cancer seen on routine mammogram and on biopsy  showing DCIS.  This lesion has some comedonecrosis.  The lesion is  however estrogen and progesterone receptor positive at greater than 95%.   The patient comes to the operating room after needle localization.  After the risks and benefits of surgery have been fully discussed,  all  questions answered and consent obtained.   PROCEDURE:  The patient was positioned supinely and following the  induction of satisfactory general anesthesia, the right breast was  prepped and draped to be included in a sterile operative field.  Positive identification of the patient as Eudora Guevarra and the site to be  the right side for right breast tumorectomy was carried out.   A transverse incision was carried down parable to the localizing wires,  is deepened through the skin and subcutaneous tissue and flaps raised up  to the region of the wire where the wire was then brought into the  wound.  A wide margin of breast  tissue was taken all the way down to the  chest wall extending medially and laterally for approximately 8 cm in  greatest diameter.  The entire mass was removed and x-rayed.  The clip  was well within the specimen and the lesion could be clearly seen.  Lesion was then forwarded for pathologic evaluation.  Hemostasis assured  with electrocautery.  Sponge and instrument counts were verified.  The  incision closed in two layers with  interrupted 3-0 Vicryl suture in the subcutaneous tissues and a running  5-0 Monocryl suture in the skin.  The skin was reinforced with Dermabond  and the patient removed from the operating room to the recovery room in  stable condition.  Tolerated the procedure well.      Leonie Man, M.D.  Electronically Signed     PB/MEDQ  D:  03/22/2008  T:  03/23/2008  Job:  [id number]

## 2011-07-18 LAB — DIFFERENTIAL
Basophils Absolute: 0
Basophils Relative: 0
Eosinophils Absolute: 0
Eosinophils Relative: 1
Lymphocytes Relative: 55 — ABNORMAL HIGH
Lymphs Abs: 1.9
Monocytes Absolute: 0.4
Monocytes Relative: 11
Neutro Abs: 1.2 — ABNORMAL LOW
Neutrophils Relative %: 33 — ABNORMAL LOW

## 2011-07-18 LAB — COMPREHENSIVE METABOLIC PANEL
ALT: 22
AST: 20
Albumin: 4.2
Alkaline Phosphatase: 97
Calcium: 9.6
Chloride: 102
GFR calc Af Amer: >60
Potassium: 4.1
Sodium: 137
Total Protein: 7.7

## 2011-07-18 LAB — CBC
HCT: 39.5
Hemoglobin: 13.6
MCHC: 34.5
MCV: 87.6
Platelets: 245
RBC: 4.5
RDW: 12.8
WBC: 3.5 — ABNORMAL LOW

## 2011-07-18 LAB — PROTIME-INR
INR: 0.9
Prothrombin Time: 12.7

## 2011-07-18 LAB — CANCER ANTIGEN 27.29: CA 27.29: 34

## 2011-07-25 LAB — CBC
HCT: 38.9
Platelets: 246
RDW: 12.7

## 2011-08-08 ENCOUNTER — Other Ambulatory Visit: Payer: Self-pay | Admitting: Physician Assistant

## 2011-08-08 ENCOUNTER — Encounter (HOSPITAL_BASED_OUTPATIENT_CLINIC_OR_DEPARTMENT_OTHER): Payer: 59 | Admitting: Oncology

## 2011-08-08 DIAGNOSIS — Z803 Family history of malignant neoplasm of breast: Secondary | ICD-10-CM

## 2011-08-08 DIAGNOSIS — Z923 Personal history of irradiation: Secondary | ICD-10-CM

## 2011-08-08 DIAGNOSIS — Z853 Personal history of malignant neoplasm of breast: Secondary | ICD-10-CM

## 2011-08-08 DIAGNOSIS — C50219 Malignant neoplasm of upper-inner quadrant of unspecified female breast: Secondary | ICD-10-CM

## 2011-08-08 LAB — COMPREHENSIVE METABOLIC PANEL
BUN: 13 mg/dL (ref 6–23)
Calcium: 10 mg/dL (ref 8.4–10.5)
Creatinine, Ser: 0.68 mg/dL (ref 0.50–1.10)
Glucose, Bld: 148 mg/dL — ABNORMAL HIGH (ref 70–99)
Potassium: 3.8 mEq/L (ref 3.5–5.3)
Sodium: 138 mEq/L (ref 135–145)
Total Bilirubin: 0.3 mg/dL (ref 0.3–1.2)

## 2011-08-08 LAB — CBC WITH DIFFERENTIAL/PLATELET
BASO%: 0.5 % (ref 0.0–2.0)
EOS%: 2.1 % (ref 0.0–7.0)
Eosinophils Absolute: 0.1 10*3/uL (ref 0.0–0.5)
MCHC: 34.4 g/dL (ref 31.5–36.0)
MCV: 88.3 fL (ref 79.5–101.0)
MONO%: 6.3 % (ref 0.0–14.0)
NEUT#: 2.5 10*3/uL (ref 1.5–6.5)
RBC: 4.42 10*6/uL (ref 3.70–5.45)
RDW: 12.5 % (ref 11.2–14.5)

## 2011-08-08 LAB — CANCER ANTIGEN 27.29: CA 27.29: 38 U/mL (ref 0–39)

## 2011-08-08 LAB — VITAMIN D 25 HYDROXY (VIT D DEFICIENCY, FRACTURES): Vit D, 25-Hydroxy: 37 ng/mL (ref 30–89)

## 2011-08-08 LAB — LACTATE DEHYDROGENASE: LDH: 128 U/L (ref 94–250)

## 2011-09-19 NOTE — Progress Notes (Signed)
CC:   Michelle L. Vincente Poli, M.D. Anselmo Rod, MD, [facility]  PROBLEM LIST: 1. 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 2. History of right sided ductal carcinoma in situ status post     lumpectomy, sentinel lymph node dissection June 2009 followed by     radiation completed September 2009, brief course of Femara,     switched to tamoxifen, discontinued. 3. Strong family history of breast cancer status post BSO in the past.  Jordan Willis returns for followup.  Doing fairly well.  Appetite good.  Weight is stable.  Denies any headache, blurry vision, shortness of breath or cough.  Continues to have some discomfort in her hand on the right side as well as right and left feet.  She is due for a followup mammogram and likely a followup MRI scan.  Aside from that, she seems to be doing well.  ECOG status 0.  Medication list reviewed.  She remains on the same medication as before.  PHYSICAL EXAMINATION:  Pleasant, alert woman looking stated age.  Vital signs:  Blood pressure 124/79, temperature 97.9, pulse 82, respiratory rate 20.  Head and neck:  No palpable peripheral adenopathy.  Trachea midline.  No thyromegaly.  Extraocular movements are normal.  Lungs: Clear.  Heart:  Sounds are normal.  She has had bilateral breast surgery.  There is no obvious masses in either breast.  Both axilla negative.  There is no nipple retraction or skin changes.  Abdomen: Soft.  No palpable hepatosplenomegaly.  No inguinal adequate.  No peripheral cyanosis, clubbing or edema.  IMPRESSION/PLAN:  Swati is doing fairly well.  We will go ahead and schedule a followup in 6 months.  Lab work done today is still pending. Will try to get an MRI scan scheduled for this coming November as well.    ______________________________ Pierce Crane,  M.D., F.R.C.P.C. PR/MEDQ  D:  09/19/2011  T:  09/19/2011  Job:  [id number]

## 2011-10-01 ENCOUNTER — Ambulatory Visit
Admission: RE | Admit: 2011-10-01 | Discharge: 2011-10-01 | Disposition: A | Discharge status: Home / Self Care | Payer: 59 | Source: Ambulatory Visit | Attending: Oncology | Admitting: Oncology

## 2011-10-01 DIAGNOSIS — Z9889 Other specified postprocedural states: Secondary | ICD-10-CM

## 2011-10-27 ENCOUNTER — Ambulatory Visit: Payer: 59

## 2011-10-27 DIAGNOSIS — B9789 Other viral agents as the cause of diseases classified elsewhere: Secondary | ICD-10-CM

## 2011-11-01 ENCOUNTER — Ambulatory Visit: Payer: 59

## 2011-11-01 DIAGNOSIS — B9789 Other viral agents as the cause of diseases classified elsewhere: Secondary | ICD-10-CM

## 2011-11-26 ENCOUNTER — Telehealth: Payer: Self-pay | Admitting: *Deleted

## 2011-11-26 NOTE — Telephone Encounter (Signed)
patient cofirmed over the phone the new date and time on 01-30-2012 starting at 8:30am with labs

## 2012-01-23 ENCOUNTER — Telehealth: Payer: Self-pay | Admitting: *Deleted

## 2012-01-23 NOTE — Telephone Encounter (Signed)
per reschedule list moved patient to 01-28-2012 starting at 8:30am left voice message to inform the patient of the new date and time

## 2012-01-28 ENCOUNTER — Other Ambulatory Visit: Payer: 59 | Admitting: Lab

## 2012-01-28 ENCOUNTER — Ambulatory Visit: Payer: 59 | Admitting: Oncology

## 2012-01-30 ENCOUNTER — Ambulatory Visit: Payer: 59 | Admitting: Oncology

## 2012-01-30 ENCOUNTER — Other Ambulatory Visit: Payer: 59 | Admitting: Lab

## 2012-02-06 ENCOUNTER — Telehealth: Payer: Self-pay | Admitting: *Deleted

## 2012-02-06 NOTE — Telephone Encounter (Signed)
patient came in and was given an appointment to see dr.rubin on 02-22-2012 starting at 9:00am

## 2012-02-18 ENCOUNTER — Telehealth: Payer: Self-pay | Admitting: *Deleted

## 2012-02-18 NOTE — Telephone Encounter (Signed)
moved appointment to 03-03-2012 starting at 11:15am patient confirmed over the phone the new date and time

## 2012-02-21 ENCOUNTER — Ambulatory Visit: Payer: 59 | Admitting: Oncology

## 2012-02-21 ENCOUNTER — Other Ambulatory Visit: Payer: 59 | Admitting: Lab

## 2012-02-22 ENCOUNTER — Other Ambulatory Visit: Payer: 59 | Admitting: Lab

## 2012-02-22 ENCOUNTER — Ambulatory Visit: Payer: 59 | Admitting: Oncology

## 2012-03-03 ENCOUNTER — Ambulatory Visit (HOSPITAL_BASED_OUTPATIENT_CLINIC_OR_DEPARTMENT_OTHER): Payer: 59 | Admitting: Physician Assistant

## 2012-03-03 ENCOUNTER — Telehealth: Payer: Self-pay | Admitting: *Deleted

## 2012-03-03 ENCOUNTER — Other Ambulatory Visit (HOSPITAL_BASED_OUTPATIENT_CLINIC_OR_DEPARTMENT_OTHER): Payer: 59 | Admitting: Lab

## 2012-03-03 ENCOUNTER — Encounter: Payer: Self-pay | Admitting: Physician Assistant

## 2012-03-03 VITALS — BP 150/83 | HR 69 | Temp 97.9°F | Ht 67.5 in | Wt 199.2 lb

## 2012-03-03 DIAGNOSIS — D0590 Unspecified type of carcinoma in situ of unspecified breast: Secondary | ICD-10-CM

## 2012-03-03 DIAGNOSIS — Z171 Estrogen receptor negative status [ER-]: Secondary | ICD-10-CM

## 2012-03-03 DIAGNOSIS — D0511 Intraductal carcinoma in situ of right breast: Secondary | ICD-10-CM

## 2012-03-03 DIAGNOSIS — C50412 Malignant neoplasm of upper-outer quadrant of left female breast: Secondary | ICD-10-CM | POA: Insufficient documentation

## 2012-03-03 DIAGNOSIS — C50919 Malignant neoplasm of unspecified site of unspecified female breast: Secondary | ICD-10-CM

## 2012-03-03 DIAGNOSIS — Z853 Personal history of malignant neoplasm of breast: Secondary | ICD-10-CM

## 2012-03-03 DIAGNOSIS — Z780 Asymptomatic menopausal state: Secondary | ICD-10-CM | POA: Insufficient documentation

## 2012-03-03 DIAGNOSIS — C50219 Malignant neoplasm of upper-inner quadrant of unspecified female breast: Secondary | ICD-10-CM

## 2012-03-03 DIAGNOSIS — N951 Menopausal and female climacteric states: Secondary | ICD-10-CM

## 2012-03-03 DIAGNOSIS — C50419 Malignant neoplasm of upper-outer quadrant of unspecified female breast: Secondary | ICD-10-CM

## 2012-03-03 DIAGNOSIS — C50912 Malignant neoplasm of unspecified site of left female breast: Secondary | ICD-10-CM

## 2012-03-03 HISTORY — DX: Asymptomatic menopausal state: Z78.0

## 2012-03-03 HISTORY — DX: Malignant neoplasm of unspecified site of left female breast: C50.912

## 2012-03-03 HISTORY — DX: Intraductal carcinoma in situ of right breast: D05.11

## 2012-03-03 LAB — CBC WITH DIFFERENTIAL/PLATELET
BASO%: 0.4 % (ref 0.0–2.0)
LYMPH%: 48.4 % (ref 14.0–49.7)
MCHC: 34.4 g/dL (ref 31.5–36.0)
MONO#: 0.4 10*3/uL (ref 0.1–0.9)
Platelets: 264 10*3/uL (ref 145–400)
RBC: 4.42 10*6/uL (ref 3.70–5.45)
RDW: 12.4 % (ref 11.2–14.5)
WBC: 5 10*3/uL (ref 3.9–10.3)
lymph#: 2.4 10*3/uL (ref 0.9–3.3)
nRBC: 0 % (ref 0–0)

## 2012-03-03 MED ORDER — GABAPENTIN 100 MG PO CAPS: 300.0000 mg | ORAL_CAPSULE | Freq: Three times a day (TID) | ORAL | Status: DC

## 2012-03-03 NOTE — Progress Notes (Signed)
Hematology and Oncology Follow Up Visit  Jordan Willis [id number] [birth year] 61 y.o. 03/03/2012    HPI: Jordan Willis is a 61 year old [place] woman with a history of: 1. 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 2. History of a right-sided DCIS, ER/PR positive, for which she underwent a right lumpectomy with sentinel node dissection in June of 2009 followed by radiation which was completed September of 2009. Brief course of Femara then switched to tamoxifen but discontinued. 3. Strong family history of breast carcinoma, she underwent bilateral salpingo-oophorectomy in the past.   Interim History:   Jordan Willis is seen today for routine follow pertain to her history of both a left breast carcinoma and DCIS of the right breast. 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 She denies any unexplained fevers, chills, but does have quite a bit of hot flashes. She also describes intermittent pain in her right breast, this is being going on for quite some time now, but she wonders about possible medication she could take in this regards. She underwent a mammogram in December 2012 and all was well. She still has carpal tunnel issues of her right hand. She denies any Frank shortness of breath. Her appetite has been fine without nausea, emesis, diarrhea or constipation issues. She does admit that she has been out of shape, but has not been exercising on a regular basis. Her medication list has been reviewed and updated. A detailed review of systems is otherwise noncontributory as noted below.  Review of Systems: Constitutional:  no weight loss, fever, night sweats and feels well Eyes: no complaints ENT: no complaints Cardiovascular: no chest pain  or dyspnea on exertion Respiratory: no cough, shortness of breath, or wheezing Neurological: no TIA or stroke symptoms Dermatological: negative Gastrointestinal: no abdominal pain, change in bowel habits, or black or bloody stools Genito-Urinary: no dysuria, trouble voiding, or hematuria Hematological and Lymphatic: negative Breast: positive for - intermittent pain in R breast. Musculoskeletal: negative Remaining ROS negative.   Medications:   I have reviewed the patient's current medications.  Current Outpatient Prescriptions  Medication Sig Dispense Refill  . cholecalciferol (VITAMIN D) 1000 UNITS tablet Take 1,000 Units by mouth daily.      Marland Kitchen levocetirizine (XYZAL) 5 MG tablet Take 5 mg by mouth every evening.      . Multiple Vitamin (MULITIVITAMIN WITH MINERALS) TABS Take 1 tablet by mouth daily.      Marland Kitchen zolpidem (AMBIEN) 5 MG tablet Take 5 mg by mouth at bedtime as needed.      . gabapentin (NEURONTIN) 100 MG capsule Take 3 capsules (300 mg total) by mouth 3 (three) times daily.  90 capsule  3    Allergies: No Known Allergies  Physical Exam: Filed Vitals:   03/03/12 1202  BP: 150/83  Pulse: 69  Temp: 97.9 F (36.6 C)    Body mass index is 30.74 kg/(m^2). Weight: 199 lbs. HEENT:  Sclerae anicteric, conjunctivae pink.  Oropharynx clear.  No mucositis or candidiasis.   Nodes:  No cervical, supraclavicular, or axillary lymphadenopathy palpated.  Breast Exam: The right breast was examined, the lumpectomy scar in intermittent quadrant is benign without any dominant mass effect, no nipple inversion or discharge, axilla is clear. The left breast was examined, is free of masses  skin changes nipple inversion or discharge, again, the lumpectomy scar on this side is also benign. Lungs:  Clear to auscultation bilaterally.  No crackles, rhonchi, or wheezes.   Heart:  Regular rate and rhythm.   Abdomen:  Soft, nontender.  Positive bowel sounds.  No organomegaly or masses palpated.     Musculoskeletal:  No focal spinal tenderness to palpation.  Extremities:  Benign.  No peripheral edema or cyanosis.   Skin:  Benign.   Neuro:  Nonfocal.   Lab Results: Lab Results  Component Value Date   WBC 5.6 08/08/2011   HGB 13.4 08/08/2011   HCT 39.1 08/08/2011   MCV 88.3 08/08/2011   PLT 270 08/08/2011   NEUTROABS 2.5 08/08/2011     Chemistry      Component Value Date/Time   NA 138 08/08/2011 1157   NA 138 08/08/2011 1157   NA 138 08/08/2011 1157   K 3.8 08/08/2011 1157   K 3.8 08/08/2011 1157   K 3.8 08/08/2011 1157   CL 103 08/08/2011 1157   CL 103 08/08/2011 1157   CL 103 08/08/2011 1157   CO2 23 08/08/2011 1157   CO2 23 08/08/2011 1157   CO2 23 08/08/2011 1157   BUN 13 08/08/2011 1157   BUN 13 08/08/2011 1157   BUN 13 08/08/2011 1157   CREATININE 0.68 08/08/2011 1157   CREATININE 0.68 08/08/2011 1157   CREATININE 0.68 08/08/2011 1157      Component Value Date/Time   CALCIUM 10.0 08/08/2011 1157   CALCIUM 10.0 08/08/2011 1157   CALCIUM 10.0 08/08/2011 1157   ALKPHOS 97 08/08/2011 1157   ALKPHOS 97 08/08/2011 1157   ALKPHOS 97 08/08/2011 1157   AST 15 08/08/2011 1157   AST 15 08/08/2011 1157   AST 15 08/08/2011 1157   ALT 11 08/08/2011 1157   ALT 11 08/08/2011 1157   ALT 11 08/08/2011 1157   BILITOT 0.3 08/08/2011 1157   BILITOT 0.3 08/08/2011 1157   BILITOT 0.3 08/08/2011 1157      Lab Results  Component Value Date   LABCA2 38 08/08/2011   LABCA2 38 08/08/2011    Radiological Studies: 10/01/11 DIGITAL DIAGNOSTIC BILATERAL MAMMOGRAM WITH CAD  Comparison: Prior studies  Findings: There is a scattered fibroglandular pattern present  which is stable. There is no specific evidence for recurrent tumor  or developing malignancy within either breast. There are  calcifications consistent with fat necrosis associated with the  right lumpectomy located medially within the right breast.  Mammographic images were processed with CAD.  IMPRESSION:   No findings worrisome for recurrent tumor or developing malignancy.  Recommend annual diagnostic mammography.  BI-RADS CATEGORY 2: Benign finding(s).  Original Report Authenticated By: Rolla Plate, M.D.    Assessment:  Jordan Willis is a 61 year old [place] woman with a history of: 1. 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 2. History of a right-sided DCIS, ER/PR positive, for which she underwent a right lumpectomy with sentinel node dissection in June of 2009 followed by radiation which was completed September of 2009. Brief course of Femara then switched to tamoxifen but discontinued. 3. Strong family history of breast carcinoma, she underwent bilateral salpingo-oophorectomy in the past. 4. Hot flashes.  Case to be reviewed with Dr. Pierce Crane.   Plan:  Jordan Willis is doing well, and has no evidence to  suggest recurrent or metastatic disease. We will try a course of gabapentin 100 mg by mouth 3 times a day. I suspect, this will help with her hot flashes, and her intermittent neuralgia of her right breast. We will plan on seeing her back in 6 months for routine followup, though she knows we will see her sooner if the need should arise. This plan was reviewed with the patient, who voices understanding and agreement.  She knows to call with any changes or problems.    Jordan Willis T, PA-C 03/03/2012

## 2012-03-03 NOTE — Telephone Encounter (Signed)
gave patient appointment for 08-2012 starting at 8:30am with labs followed by md appointments

## 2012-03-04 LAB — COMPREHENSIVE METABOLIC PANEL
ALT: 13 U/L (ref 0–35)
AST: 13 U/L (ref 0–37)
Calcium: 10 mg/dL (ref 8.4–10.5)
Chloride: 105 mEq/L (ref 96–112)
Creatinine, Ser: 0.64 mg/dL (ref 0.50–1.10)
Sodium: 140 mEq/L (ref 135–145)
Total Protein: 7.2 g/dL (ref 6.0–8.3)

## 2012-04-19 ENCOUNTER — Ambulatory Visit: Payer: 59

## 2012-05-05 ENCOUNTER — Telehealth: Payer: Self-pay | Admitting: *Deleted

## 2012-05-05 NOTE — Telephone Encounter (Signed)
Attempted to return patient's phone call regarding breast pain. No answer. Left message.

## 2012-06-08 ENCOUNTER — Ambulatory Visit: Payer: 59

## 2012-06-08 ENCOUNTER — Ambulatory Visit: Payer: 59 | Admitting: Emergency Medicine

## 2012-06-08 VITALS — BP 138/76 | HR 73 | Temp 98.0°F | Resp 16 | Ht 67.5 in | Wt 199.0 lb

## 2012-06-08 DIAGNOSIS — J329 Chronic sinusitis, unspecified: Secondary | ICD-10-CM

## 2012-06-08 MED ORDER — FLUTICASONE PROPIONATE 50 MCG/ACT NA SUSP: 2.0000 | Freq: Every day | NASAL | Status: DC

## 2012-06-08 MED ORDER — PREDNISONE 10 MG PO TABS: ORAL_TABLET | ORAL | Status: DC

## 2012-06-08 MED ORDER — AMOXICILLIN-POT CLAVULANATE 875-125 MG PO TABS: 1.0000 | ORAL_TABLET | Freq: Two times a day (BID) | ORAL | Status: AC

## 2012-06-08 NOTE — Progress Notes (Signed)
  Subjective:    Patient ID: Jordan Willis, female    DOB: [birth year], 61 y.o.   MRN: [id number]  HPI patient has a long history of sinus problems. She tried coming a couple weeks ago but did not have her card so was not seen. She works long hours. She is here today with left facial pain. She has significant discomfort when she bends her head forward. She has had intermittent bloody nasal drainage. She has not had a cough. She has had multiple mild sore throat but not severe. She denies any fever or chills. She feels this illness has made her weak and in fact yesterday felt like she might faint.    Review of Systems     Objective:   Physical Exam HEENT is unremarkable TMs are clear the nose is congested. Throat is normal. Her chest is clear to auscultation  [facility] reading (PRIMARY) by  Dr.Princeston Blizzard is thickening of both inferior maxillary sinuses right greater than left        Assessment & Plan:  Patient here with allergic rhinitis and acute sinusitis.

## 2012-06-08 NOTE — Patient Instructions (Signed)

## 2012-07-02 ENCOUNTER — Ambulatory Visit (INDEPENDENT_AMBULATORY_CARE_PROVIDER_SITE_OTHER): Payer: 59 | Admitting: Physician Assistant

## 2012-07-02 ENCOUNTER — Encounter: Payer: Self-pay | Admitting: Physician Assistant

## 2012-07-02 VITALS — BP 138/72 | HR 75 | Temp 98.0°F | Resp 18 | Ht 67.0 in | Wt 196.0 lb

## 2012-07-02 DIAGNOSIS — C50919 Malignant neoplasm of unspecified site of unspecified female breast: Secondary | ICD-10-CM

## 2012-07-02 DIAGNOSIS — E785 Hyperlipidemia, unspecified: Secondary | ICD-10-CM

## 2012-07-02 DIAGNOSIS — J310 Chronic rhinitis: Secondary | ICD-10-CM

## 2012-07-02 DIAGNOSIS — E669 Obesity, unspecified: Secondary | ICD-10-CM

## 2012-07-02 DIAGNOSIS — Z0000 Encounter for general adult medical examination without abnormal findings: Secondary | ICD-10-CM

## 2012-07-02 DIAGNOSIS — R5383 Other fatigue: Secondary | ICD-10-CM

## 2012-07-02 DIAGNOSIS — Z23 Encounter for immunization: Secondary | ICD-10-CM

## 2012-07-02 LAB — POCT URINALYSIS DIPSTICK
Glucose, UA: NEGATIVE
Protein, UA: NEGATIVE
Spec Grav, UA: 1.02

## 2012-07-02 LAB — COMPREHENSIVE METABOLIC PANEL
Albumin: 5 g/dL (ref 3.5–5.2)
BUN: 15 mg/dL (ref 6–23)
Calcium: 10 mg/dL (ref 8.4–10.5)
Chloride: 103 mEq/L (ref 96–112)
Glucose, Bld: 101 mg/dL — ABNORMAL HIGH (ref 70–99)
Potassium: 3.8 mEq/L (ref 3.5–5.3)

## 2012-07-02 LAB — CBC WITH DIFFERENTIAL/PLATELET
HCT: 41.4 % (ref 36.0–46.0)
Hemoglobin: 14.2 g/dL (ref 12.0–15.0)
Lymphocytes Relative: 43 % (ref 12–46)
Monocytes Absolute: 0.3 10*3/uL (ref 0.1–1.0)
Monocytes Relative: 7 % (ref 3–12)
Neutro Abs: 2.3 10*3/uL (ref 1.7–7.7)
WBC: 4.7 10*3/uL (ref 4.0–10.5)

## 2012-07-02 LAB — TSH: TSH: 0.401 u[IU]/mL (ref 0.350–4.500)

## 2012-07-02 LAB — LIPID PANEL
HDL: 46 mg/dL (ref >39)
Triglycerides: 136 mg/dL (ref <150)

## 2012-07-02 MED ORDER — PNEUMOCOCCAL VAC POLYVALENT 25 MCG/0.5ML IJ INJ: 0.5000 mL | INJECTION | Freq: Once | INTRAMUSCULAR | Status: DC

## 2012-07-02 MED ORDER — METHYLPREDNISOLONE ACETATE 80 MG/ML IJ SUSP
80.0000 mg | Freq: Once | INTRAMUSCULAR | Status: AC
  Administered 2012-07-02: 80 mg via INTRAMUSCULAR

## 2012-07-02 MED ORDER — ZOLPIDEM TARTRATE 5 MG PO TABS: 5.0000 mg | ORAL_TABLET | Freq: Every evening | ORAL | Status: DC | PRN

## 2012-07-02 MED ORDER — METAXALONE 800 MG PO TABS: 800.0000 mg | ORAL_TABLET | Freq: Three times a day (TID) | ORAL | Status: AC

## 2012-07-02 NOTE — Progress Notes (Signed)
  Subjective:    Patient ID: Jordan Willis, female    DOB: 02-17-1951, 61 y.o.   MRN: [id number]  HPI 61 yr old AAF presents for CPE.  Her breast cancer follow-ups have been good.  She has bi-annual breast exams and does SBE monthly. She has not had any recurrence since 2009. She c/o fatigue in general, but she isn't sure if this is bc she just doesn't get enough sleep.  She had normal bone density testing in 2012.    She was here about 3 weeks ago and was treated for sinusitis.  She has improved somewhat and is no longer blowing discolored mucus out.  She is still suffering from a fair amount of congestion which seemed to improve briefly while she was on the prednisone.  Review of Systems  All other systems reviewed and are negative.       Objective:   Physical Exam  Nursing note and vitals reviewed. Constitutional: She is oriented to person, place, and time. She appears well-developed and well-nourished.  HENT:  Head: Normocephalic and atraumatic.  Mouth/Throat: Oropharynx is clear and moist. No oropharyngeal exudate.       B turbinates bulging slightly and dull. No erythema. Turbinates inflamed and enlarged, PND present.  Eyes: Conjunctivae normal and EOM are normal. Pupils are equal, round, and reactive to light. Right eye exhibits no discharge. Left eye exhibits no discharge.  Neck: Normal range of motion. Neck supple. No tracheal deviation present. No thyromegaly present.  Cardiovascular: Normal rate, regular rhythm, normal heart sounds and intact distal pulses.  Exam reveals no gallop.   No murmur heard. Pulmonary/Chest: Effort normal and breath sounds normal.  Abdominal: Soft.  Genitourinary:       Not needed/done  Musculoskeletal: Normal range of motion.  Lymphadenopathy:    She has no cervical adenopathy.  Neurological: She is alert and oriented to person, place, and time. She has normal reflexes. No cranial nerve deficit. Coordination normal.  Skin: Skin is warm and dry.    Psychiatric: She has a normal mood and affect. Her behavior is normal.     Results for orders placed in visit on 07/02/12  GLUCOSE, POCT (MANUAL RESULT ENTRY)      Component Value Range   POC Glucose 95  70 - 99 mg/dl  POCT URINALYSIS DIPSTICK      Component Value Range   Color, UA yellow     Clarity, UA clear     Glucose, UA neg     Bilirubin, UA neg     Ketones, UA trace     Spec Grav, UA 1.020     Blood, UA trace     pH, UA 6.0     Protein, UA neg     Urobilinogen, UA 1.0     Nitrite, UA neg     Leukocytes, UA Negative          Assessment & Plan:  CPE-normal exam H/o breast cancer-in remission and normal/frequent check ups with Dr. Donnie Coffin and biannual mammograms. Rhinitis-add flonase.  Depo medrol IM today.  Cool air humidifier.  Insomnia-stable on ambien Intermittent muscle spams of trapezius and no radicular s/sx-stable with prn skelaxin.

## 2012-07-03 LAB — VITAMIN D 25 HYDROXY (VIT D DEFICIENCY, FRACTURES): Vit D, 25-Hydroxy: 36 ng/mL (ref 30–89)

## 2012-09-12 ENCOUNTER — Ambulatory Visit (HOSPITAL_BASED_OUTPATIENT_CLINIC_OR_DEPARTMENT_OTHER): Payer: 59 | Admitting: Oncology

## 2012-09-12 ENCOUNTER — Other Ambulatory Visit (HOSPITAL_BASED_OUTPATIENT_CLINIC_OR_DEPARTMENT_OTHER): Payer: 59 | Admitting: Lab

## 2012-09-12 ENCOUNTER — Telehealth: Payer: Self-pay | Admitting: Oncology

## 2012-09-12 VITALS — BP 137/85 | HR 68 | Temp 97.8°F | Resp 20 | Ht 67.0 in | Wt 193.8 lb

## 2012-09-12 DIAGNOSIS — E559 Vitamin D deficiency, unspecified: Secondary | ICD-10-CM

## 2012-09-12 DIAGNOSIS — Z853 Personal history of malignant neoplasm of breast: Secondary | ICD-10-CM

## 2012-09-12 DIAGNOSIS — C50919 Malignant neoplasm of unspecified site of unspecified female breast: Secondary | ICD-10-CM

## 2012-09-12 DIAGNOSIS — N644 Mastodynia: Secondary | ICD-10-CM

## 2012-09-12 DIAGNOSIS — Z803 Family history of malignant neoplasm of breast: Secondary | ICD-10-CM

## 2012-09-12 DIAGNOSIS — Z87898 Personal history of other specified conditions: Secondary | ICD-10-CM

## 2012-09-12 LAB — CBC WITH DIFFERENTIAL/PLATELET
BASO%: 0.4 % (ref 0.0–2.0)
Basophils Absolute: 0 10e3/uL (ref 0.0–0.1)
EOS%: 2.1 % (ref 0.0–7.0)
Eosinophils Absolute: 0.1 10e3/uL (ref 0.0–0.5)
HCT: 39.5 % (ref 34.8–46.6)
HGB: 13.6 g/dL (ref 11.6–15.9)
LYMPH%: 44 % (ref 14.0–49.7)
MCH: 30.8 pg (ref 25.1–34.0)
MCHC: 34.4 g/dL (ref 31.5–36.0)
MCV: 89.4 fL (ref 79.5–101.0)
MONO#: 0.4 10e3/uL (ref 0.1–0.9)
MONO%: 8.1 % (ref 0.0–14.0)
NEUT#: 2.5 10e3/uL (ref 1.5–6.5)
NEUT%: 45.4 % (ref 38.4–76.8)
Platelets: 244 10e3/uL (ref 145–400)
RBC: 4.42 10e6/uL (ref 3.70–5.45)
RDW: 12.9 % (ref 11.2–14.5)
WBC: 5.5 10e3/uL (ref 3.9–10.3)
lymph#: 2.4 10e3/uL (ref 0.9–3.3)

## 2012-09-12 LAB — COMPREHENSIVE METABOLIC PANEL (CC13)
AST: 16 U/L (ref 5–34)
Albumin: 4.4 g/dL (ref 3.5–5.0)
Alkaline Phosphatase: 110 U/L (ref 40–150)
BUN: 8 mg/dL (ref 7.0–26.0)
Potassium: 3.9 mEq/L (ref 3.5–5.1)
Total Bilirubin: 0.4 mg/dL (ref 0.20–1.20)

## 2012-09-12 LAB — LACTATE DEHYDROGENASE (CC13): LDH: 150 U/L (ref 125–245)

## 2012-09-12 LAB — CANCER ANTIGEN 27.29: CA 27.29: 37 U/mL (ref 0–39)

## 2012-09-12 NOTE — Telephone Encounter (Signed)
gve the pt her [id number] appt calendar along with the mammo/bone density appt. Pt is aware that she will be contacted with the mri breast appt

## 2012-09-12 NOTE — Progress Notes (Signed)
Hematology and Oncology Follow Up Visit  Jordan Willis [id number] February 01, 1951 61 y.o. 09/12/2012    HPI: Jordan Willis is a 61 year old [place] woman with a history of: 1. 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 2. History of a right-sided DCIS, ER/PR positive, for which she underwent a right lumpectomy with sentinel node dissection in June of 2009 followed by radiation which was completed September of 2009. Brief course of Femara then switched to tamoxifen but discontinued. 3. Strong family history of breast carcinoma, + BRCA she underwent bilateral salpingo-oophorectomy in the past.    Interim History:   Jordan Willis is seen today for routine follow pertain to her history of both a left breast carcinoma and DCIS of the right breast. 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 She denies any unexplained fevers, chills, but does have quite a bit of hot flashes. She has had discomfort in her lt breast , especially in the tail of the breast . Last mammogram 12/13 was wnl.  She has not had an MRI in 3 yrs. A detailed review of systems is otherwise noncontributory as noted below.  Review of Systems: Constitutional:  no weight loss, fever, night sweats and feels well Eyes: no complaints ENT: no complaints Cardiovascular: no chest pain or dyspnea on exertion Respiratory: no cough, shortness of breath, or wheezing Neurological: no TIA or stroke symptoms Dermatological: negative Gastrointestinal: no abdominal pain, change in bowel habits, or black or bloody stools Genito-Urinary: no dysuria, trouble voiding, or hematuria Hematological and Lymphatic: negative Breast: positive for - intermittent pain in lt  breast. Musculoskeletal:  negative Remaining ROS negative.   Medications:   I have reviewed the patient's current medications.  Current Outpatient Prescriptions  Medication Sig Dispense Refill  . cholecalciferol (VITAMIN D) 1000 UNITS tablet Take 1,000 Units by mouth daily.      . fluticasone (FLONASE) 50 MCG/ACT nasal spray Place 2 sprays into the nose daily.  16 g  6  . Multiple Vitamin (MULITIVITAMIN WITH MINERALS) TABS Take 1 tablet by mouth daily.      Marland Kitchen zolpidem (AMBIEN) 5 MG tablet Take 1 tablet (5 mg total) by mouth at bedtime as needed.  30 tablet  5  . gabapentin (NEURONTIN) 100 MG capsule Take 3 capsules (300 mg total) by mouth 3 (three) times daily.  90 capsule  3  . levocetirizine (XYZAL) 5 MG tablet Take 5 mg by mouth every evening.        Allergies: No Known Allergies  Physical Exam: Filed Vitals:   09/12/12 0842  BP: 137/85  Pulse: 68  Temp: 97.8 F (36.6 C)  Resp: 20    Body mass index is 30.35 kg/(m^2). Weight: 199 lbs. HEENT:  Sclerae anicteric, conjunctivae pink.  Oropharynx clear.  No mucositis or candidiasis.   Nodes:  No cervical, supraclavicular, or axillary lymphadenopathy palpated.  Breast Exam: The right breast was examined, the lumpectomy scar in intermittent quadrant is benign without any dominant mass effect, no nipple inversion or discharge, axilla is clear. The left breast was examined, is free of masses skin changes nipple inversion or discharge, again, the lumpectomy scar on this side is also benign., there is a slight protrusion in the tail of the lt brst, with possible mass effect and tenderness. Lungs:  Clear to auscultation bilaterally.  No crackles, rhonchi, or wheezes.   Heart:  Regular rate and rhythm.   Abdomen:  Soft, nontender.  Positive bowel sounds.  No organomegaly or masses palpated.   Musculoskeletal:  No focal spinal tenderness to palpation.  Extremities:  Benign.  No peripheral edema or cyanosis.   Skin:  Benign.   Neuro:  Nonfocal.   Lab  Results: Lab Results  Component Value Date   WBC 5.5 09/12/2012   HGB 13.6 09/12/2012   HCT 39.5 09/12/2012   MCV 89.4 09/12/2012   PLT 244 09/12/2012   NEUTROABS 2.5 09/12/2012     Chemistry      Component Value Date/Time   NA 137 07/02/2012 1025   K 3.8 07/02/2012 1025   CL 103 07/02/2012 1025   CO2 23 07/02/2012 1025   BUN 15 07/02/2012 1025   CREATININE 0.94 07/02/2012 1025   CREATININE 0.64 03/03/2012 1151      Component Value Date/Time   CALCIUM 10.0 07/02/2012 1025   ALKPHOS 91 07/02/2012 1025   AST 17 07/02/2012 1025   ALT 17 07/02/2012 1025   BILITOT 0.4 07/02/2012 1025      Lab Results  Component Value Date   LABCA2 38 08/08/2011   LABCA2 38 08/08/2011    Radiological Studies: 10/01/11 DIGITAL DIAGNOSTIC BILATERAL MAMMOGRAM WITH CAD  Comparison: Prior studies  Findings: There is a scattered fibroglandular pattern present  which is stable. There is no specific evidence for recurrent tumor  or developing malignancy within either breast. There are  calcifications consistent with fat necrosis associated with the  right lumpectomy located medially within the right breast.  Mammographic images were processed with CAD.  IMPRESSION:  No findings worrisome for recurrent tumor or developing malignancy.  Recommend annual diagnostic mammography.  BI-RADS CATEGORY 2: Benign finding(s).  Original Report Authenticated By: Rolla Plate, M.D.    Assessment:  Jordan Willis is a 61 year old [place] woman with a history of: 1. 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 2. History of a right-sided DCIS, ER/PR positive, for which she underwent a right lumpectomy with sentinel node dissection in June of 2009 followed by radiation which was completed September of 2009.  Brief course of Femara then switched to tamoxifen but discontinued. 3. Strong family history of breast carcinoma, she underwent bilateral salpingo-oophorectomy in the past. 4. Hot flashes.     Plan:  Jordan Willis is doing well, and has no evidence to suggest recurrent or metastatic disease. I am concerned about her lt side and will set up imaging studies. If these are negative will f/u in 6 months.will also schedule a bone density test. This plan was reviewed with the patient, who voices understanding and agreement.  She knows to call with any changes or problems.    Jordan Brumbaugh  md ,09/12/2012

## 2012-10-03 ENCOUNTER — Ambulatory Visit
Admission: RE | Admit: 2012-10-03 | Discharge: 2012-10-03 | Disposition: A | Discharge status: Home / Self Care | Payer: 59 | Source: Ambulatory Visit | Attending: Oncology | Admitting: Oncology

## 2012-10-03 DIAGNOSIS — E559 Vitamin D deficiency, unspecified: Secondary | ICD-10-CM

## 2012-10-03 DIAGNOSIS — C50919 Malignant neoplasm of unspecified site of unspecified female breast: Secondary | ICD-10-CM

## 2012-10-06 ENCOUNTER — Ambulatory Visit (HOSPITAL_COMMUNITY)
Admission: RE | Admit: 2012-10-06 | Discharge: 2012-10-06 | Disposition: A | Discharge status: Home / Self Care | Payer: 59 | Source: Ambulatory Visit | Attending: Oncology | Admitting: Oncology

## 2012-10-06 ENCOUNTER — Other Ambulatory Visit: Payer: Self-pay | Admitting: Oncology

## 2012-10-06 DIAGNOSIS — E559 Vitamin D deficiency, unspecified: Secondary | ICD-10-CM

## 2012-10-06 DIAGNOSIS — Z1501 Genetic susceptibility to malignant neoplasm of breast: Secondary | ICD-10-CM | POA: Insufficient documentation

## 2012-10-06 DIAGNOSIS — L905 Scar conditions and fibrosis of skin: Secondary | ICD-10-CM | POA: Insufficient documentation

## 2012-10-06 DIAGNOSIS — C50919 Malignant neoplasm of unspecified site of unspecified female breast: Secondary | ICD-10-CM

## 2012-10-06 MED ORDER — GADOBENATE DIMEGLUMINE 529 MG/ML IV SOLN
20.0000 mL | Freq: Once | INTRAVENOUS | Status: AC | PRN
  Administered 2012-10-06: 20 mL via INTRAVENOUS

## 2012-10-22 DIAGNOSIS — J329 Chronic sinusitis, unspecified: Secondary | ICD-10-CM

## 2012-10-22 HISTORY — DX: Chronic sinusitis, unspecified: J32.9

## 2012-10-27 ENCOUNTER — Ambulatory Visit: Payer: 59 | Admitting: Family Medicine

## 2012-10-27 VITALS — BP 132/76 | HR 85 | Temp 98.0°F | Resp 17 | Ht 67.5 in | Wt 200.0 lb

## 2012-10-27 DIAGNOSIS — J019 Acute sinusitis, unspecified: Secondary | ICD-10-CM

## 2012-10-27 DIAGNOSIS — R011 Cardiac murmur, unspecified: Secondary | ICD-10-CM

## 2012-10-27 DIAGNOSIS — J069 Acute upper respiratory infection, unspecified: Secondary | ICD-10-CM

## 2012-10-27 DIAGNOSIS — H9209 Otalgia, unspecified ear: Secondary | ICD-10-CM

## 2012-10-27 DIAGNOSIS — R05 Cough: Secondary | ICD-10-CM

## 2012-10-27 DIAGNOSIS — R059 Cough, unspecified: Secondary | ICD-10-CM

## 2012-10-27 MED ORDER — AMOXICILLIN-POT CLAVULANATE 875-125 MG PO TABS: 1.0000 | ORAL_TABLET | Freq: Two times a day (BID) | ORAL | Status: DC

## 2012-10-27 MED ORDER — PREDNISONE 10 MG PO TABS: ORAL_TABLET | ORAL | Status: DC

## 2012-10-27 NOTE — Progress Notes (Signed)
[facility]:  Office Visit  Chief Complaint:  Chief Complaint  Patient presents with  . Sinusitis    pain, pressure 1 week     HPI: Jordan Willis is a 62 y.o. female who complains of  Sinus pressure, frontal HA and feels dizzy. + dry Cough. Has had sinus pressure x 2 weeks, cough x 1 week, initially thick sputum, then on Friday started hurting throat, now is dry. + Left ear pain. Denies feves, + chills. Has not taken any temp. Taking tylenol without releif  Past Medical History  Diagnosis Date  . Breast cancer, female, left 03/03/2012  . 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 Menopause 03/03/2012  . Hyperlipidemia   . Arthritis    Past Surgical History  Procedure Date  . Abdominal hysterectomy   . Breast surgery 2006    invasive mammary cancer/high grade ductal carcinoma  . Breast surgery     L partial mastectomy, R lumpectomy   History   Social History  . Marital Status: Divorced    Spouse Name: N/A    Number of Children: N/A  . Years of Education: N/A   Social History Main Topics  . Smoking status: Never Smoker   . Smokeless tobacco: None  . Alcohol Use: No  . Drug Use: No  . Sexually Active: Yes    Birth Control/ Protection: None   Other Topics Concern  . None   Social History Narrative  . None   Family History  Problem Relation Age of Onset  . Cancer Mother   . Diabetes Father   . Cancer Sister    No Known Allergies Prior to Admission medications   Medication Sig Start Date End Date Taking? Authorizing Provider  cholecalciferol (VITAMIN D) 1000 UNITS tablet Take 1,000 Units by mouth daily.   Yes Historical Provider, MD  fluticasone (FLONASE) 50 MCG/ACT nasal spray Place 2 sprays into the nose daily. 06/08/12 06/08/13 Yes Collene Gobble, MD  gabapentin (NEURONTIN) 100 MG capsule Take 3 capsules (300 mg total) by mouth 3 (three) times daily. 03/03/12 03/03/13 Yes Amada Kingfisher, PA  Multiple Vitamin  (MULITIVITAMIN WITH MINERALS) TABS Take 1 tablet by mouth daily.   Yes Historical Provider, MD  levocetirizine (XYZAL) 5 MG tablet Take 5 mg by mouth every evening.    Historical Provider, MD  zolpidem (AMBIEN) 5 MG tablet Take 1 tablet (5 mg total) by mouth at bedtime as needed. 07/02/12   Anders Simmonds, PA-C     ROS: The patient denies night sweats, unintentional weight loss, chest pain, palpitations, wheezing, dyspnea on exertion, nausea, vomiting, abdominal pain, dysuria, hematuria, melena, numbness, weakness, or tingling.  All other systems have been reviewed and were otherwise negative with the exception of those mentioned in the HPI and as above.    PHYSICAL EXAM: Filed Vitals:   10/27/12 1455  BP: 132/76  Pulse: 85  Temp: 98 F (36.7 C)  Resp: 17   Filed Vitals:   10/27/12 1455  Height: 5' 7.5" (1.715 m)  Weight: 200 lb (90.719 kg)   Body mass index is 30.86 kg/(m^2).  General: Alert, no acute distress HEENT:  Normocephalic, atraumatic, oropharynx patent. + sinus tenderness, Tm nl, no exudates Cardiovascular:  Regular rate and rhythm, no rubs murmurs or gallops.  No Carotid bruits, radial pulse intact. No pedal edema.  Respiratory: Clear to auscultation bilaterally.  No wheezes, rales, or rhonchi.  No cyanosis, no use of accessory  musculature GI: No organomegaly, abdomen is soft and non-tender, positive bowel sounds.  No masses. Skin: No rashes. Neurologic: Facial musculature symmetric. Psychiatric: Patient is appropriate throughout our interaction. Lymphatic: No cervical lymphadenopathy Musculoskeletal: Gait intact.   LABS: Results for orders placed in visit on 09/12/12  CBC WITH DIFFERENTIAL      Component Value Range   WBC 5.5  3.9 - 10.3 10e3/uL   NEUT# 2.5  1.5 - 6.5 10e3/uL   HGB 13.6  11.6 - 15.9 g/dL   HCT 47.8  29.5 - 62.1 %   Platelets 244  145 - 400 10e3/uL   MCV 89.4  79.5 - 101.0 fL   MCH 30.8  25.1 - 34.0 pg   MCHC 34.4  31.5 - 36.0 g/dL   RBC  3.08  6.57 - 8.46 10e6/uL   RDW 12.9  11.2 - 14.5 %   lymph# 2.4  0.9 - 3.3 10e3/uL   MONO# 0.4  0.1 - 0.9 10e3/uL   Eosinophils Absolute 0.1  0.0 - 0.5 10e3/uL   Basophils Absolute 0.0  0.0 - 0.1 10e3/uL   NEUT% 45.4  38.4 - 76.8 %   LYMPH% 44.0  14.0 - 49.7 %   MONO% 8.1  0.0 - 14.0 %   EOS% 2.1  0.0 - 7.0 %   BASO% 0.4  0.0 - 2.0 %  CANCER ANTIGEN 27.29      Component Value Range   CA 27.29 37  0 - 39 U/mL  COMPREHENSIVE METABOLIC PANEL (CC13)      Component Value Range   Sodium 138  136 - 145 mEq/L   Potassium 3.9  3.5 - 5.1 mEq/L   Chloride 102  98 - 107 mEq/L   CO2 29  22 - 29 mEq/L   Glucose 124 (*) 70 - 99 mg/dl   BUN 8.0  7.0 - 96.2 mg/dL   Creatinine 0.7  0.6 - 1.1 mg/dL   Total Bilirubin 9.52  0.20 - 1.20 mg/dL   Alkaline Phosphatase 110  40 - 150 U/L   AST 16  5 - 34 U/L   ALT 19  0 - 55 U/L   Total Protein 7.9  6.4 - 8.3 g/dL   Albumin 4.4  3.5 - 5.0 g/dL   Calcium 84.1  8.4 - 32.4 mg/dL  LACTATE DEHYDROGENASE (CC13)      Component Value Range   LDH 150  125 - 245 U/L     EKG/XRAY:   Primary read interpreted by Dr. Conley Rolls at [facility].   ASSESSMENT/PLAN: Encounter Diagnoses  Name Primary?  . Acute sinusitis Yes  . Cough   . Otalgia    Rx Flonase and Augmentin Sxs treatment  F/u prn    Tobias Avitabile PHUONG, DO 10/28/2012 11:47 AM

## 2012-10-28 ENCOUNTER — Telehealth: Payer: Self-pay

## 2012-10-28 NOTE — Telephone Encounter (Signed)
PT STATES SHE WAS SEEN AND HAD TOLD THE DR ABOUT HER HEADACHES. WOULD LIKE TO HAVE SOMETHING CALLED IN SINCE SHE IS TAKING TYLENOL AND IBUPROFEN TOGETHER AND IS OUT OF THE TYLENOL. NEED ONLY 1 MEDICINE TO TAKE PLEASE CALL [phone]   [facility] ON [address]

## 2012-10-28 NOTE — Telephone Encounter (Signed)
Called patient to find out about her headaches, what has she taken? What is she requesting. We did see her for sinuses but no mention of headache. Unable to leave message mailbox is full

## 2012-10-29 NOTE — Telephone Encounter (Signed)
How much Tylenol and Advil is she taking?  Is she taking prednisone and flonase?

## 2012-10-29 NOTE — Telephone Encounter (Signed)
She states her facial pain is severe, has not gotten better on the Augmentin and the flonase. She is asking for something for the pain, she has taken Ibuprofen and Tylenol with no relief

## 2012-10-30 NOTE — Telephone Encounter (Signed)
I called pt back and she stated that the Abx and prednisone have worked and she no longer needs a HA medication. She stated she was able to return to work last night. I advised pt to complete entire round of Abx. Pt agreed.

## 2012-12-22 ENCOUNTER — Ambulatory Visit: Payer: 59 | Admitting: Physician Assistant

## 2012-12-22 VITALS — BP 118/82 | HR 77 | Temp 98.2°F | Resp 18 | Ht 68.0 in | Wt 198.4 lb

## 2012-12-22 DIAGNOSIS — G4700 Insomnia, unspecified: Secondary | ICD-10-CM

## 2012-12-22 DIAGNOSIS — J019 Acute sinusitis, unspecified: Secondary | ICD-10-CM

## 2012-12-22 MED ORDER — IPRATROPIUM BROMIDE 0.03 % NA SOLN: 2.0000 | Freq: Two times a day (BID) | NASAL | Status: DC

## 2012-12-22 MED ORDER — CLARITHROMYCIN ER 500 MG PO TB24: 1000.0000 mg | ORAL_TABLET | Freq: Every day | ORAL | Status: DC

## 2012-12-22 MED ORDER — ZOLPIDEM TARTRATE 5 MG PO TABS: 5.0000 mg | ORAL_TABLET | Freq: Every evening | ORAL | Status: DC | PRN

## 2012-12-22 MED ORDER — GUAIFENESIN ER 1200 MG PO TB12: 1.0000 | ORAL_TABLET | Freq: Two times a day (BID) | ORAL | Status: DC | PRN

## 2012-12-22 NOTE — Patient Instructions (Signed)
Get plenty of rest and drink at least 64 ounces of water daily. 

## 2012-12-22 NOTE — Progress Notes (Signed)
Subjective:    Patient ID: Jordan Willis, female    DOB: 10/19/1951, 62 y.o.   MRN: [id number]  HPI  This 62 y.o. female presents for evaluation of 2 weeks of illness, progressively worsening over the past several days.  Nasal/sinus congestion, green drainage, mild cough produces green sputum.  LEFT ear pain.  No st.  Fatigue, HA.  At her last visit she was given an RX for zolpidem due to insomnia, but didn't fill it immediately and has lost the Rx. She asks if she can get a replacement.   Past Medical History  Diagnosis Date  . Breast cancer, female, left 03/03/2012  . 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 Menopause 03/03/2012  . Hyperlipidemia   . Arthritis     Past Surgical History  Procedure Laterality Date  . Abdominal hysterectomy    . Breast surgery Left 2006    invasive mammary cancer/high grade ductal carcinoma; chemotherapy and radiation  . Breast surgery Right 2009    followed by radiation    Prior to Admission medications   Medication Sig Start Date End Date Taking? Authorizing Provider  cholecalciferol (VITAMIN D) 1000 UNITS tablet Take 1,000 Units by mouth daily.   Yes Historical Provider, MD  fluticasone (FLONASE) 50 MCG/ACT nasal spray Place 2 sprays into the nose daily. 06/08/12 06/08/13 Yes Collene Gobble, MD  gabapentin (NEURONTIN) 100 MG capsule Take 3 capsules (300 mg total) by mouth 3 (three) times daily. 03/03/12 03/03/13 Yes Amada Kingfisher, PA  Multiple Vitamin (MULITIVITAMIN WITH MINERALS) TABS Take 1 tablet by mouth daily.   Yes Historical Provider, MD    No Known Allergies  History   Social History  . Marital Status: Divorced    Spouse Name: n/a    Number of Children: 2  . Years of Education: 12   Occupational History  . Lab assistant     [employer]   Social History Main Topics  . Smoking status: Never Smoker   . Smokeless tobacco: Never Used  . Alcohol Use: No  . Drug Use: No  . Sexually Active: Yes -- Female  partner(s)    Birth Control/ Protection: Surgical     Comment: bilateral oophorectomy    Family History  Problem Relation Age of Onset  . Cancer Mother   . Diabetes Father   . Cancer Sister     breast cancer  . Cancer Sister     breast cancer    Review of Systems As above.  No GI/GU symptoms.    Objective:   Physical Exam  Blood pressure 118/82, pulse 77, temperature 98.2 F (36.8 C), temperature source Oral, resp. rate 18, height 5\' 8"  (1.727 m), weight 198 lb 6.4 oz (89.994 kg), SpO2 100.00%. Body mass index is 30.17 kg/(m^2). Well-developed, well nourished BF who is awake, alert and oriented, in NAD. HEENT: Organ/AT, PERRL, EOMI.  Sclera and conjunctiva are clear.  EAC are patent, TMs are normal in appearance. Nasal mucosa is pink and moist. OP is clear. Frontal and maxillary sinuses are very tender. Neck: supple, non-tender, no lymphadenopathy, thyromegaly. Heart: RRR, no murmur Lungs: normal effort, CTA Extremities: no cyanosis, clubbing or edema. Skin: warm and dry without rash. Psychologic: good mood and appropriate affect, normal speech and behavior.       Assessment & Plan:  Sinusitis, acute - Plan: ipratropium (ATROVENT) 0.03 % nasal spray, Guaifenesin (MUCINEX MAXIMUM STRENGTH) 1200 MG TB12, clarithromycin (BIAXIN XL) 500 MG 24 hr tablet  Insomnia - Plan: zolpidem (AMBIEN) 5 MG tablet

## 2013-01-10 ENCOUNTER — Telehealth: Payer: Self-pay | Admitting: *Deleted

## 2013-01-10 NOTE — Telephone Encounter (Signed)
Could not leave a message at home.  Voicemail box full.  Left a message on cell requesting the pt to return my call so I can reschedule her.

## 2013-02-12 ENCOUNTER — Telehealth: Payer: Self-pay | Admitting: *Deleted

## 2013-02-12 NOTE — Telephone Encounter (Signed)
Called and spoke with patient to reschedule her appt.  Confirmed appt. For 02/20/13 at 0900 with Bernell List.  Patient requests Dr. Welton Flakes.

## 2013-02-20 ENCOUNTER — Encounter: Payer: Self-pay | Admitting: Family

## 2013-02-20 ENCOUNTER — Telehealth: Payer: Self-pay | Admitting: *Deleted

## 2013-02-20 ENCOUNTER — Ambulatory Visit (HOSPITAL_BASED_OUTPATIENT_CLINIC_OR_DEPARTMENT_OTHER): Payer: 59 | Admitting: Family

## 2013-02-20 ENCOUNTER — Ambulatory Visit (HOSPITAL_BASED_OUTPATIENT_CLINIC_OR_DEPARTMENT_OTHER): Payer: 59 | Admitting: Lab

## 2013-02-20 VITALS — BP 155/84 | HR 74 | Temp 97.9°F | Resp 20 | Ht 68.0 in | Wt 196.0 lb

## 2013-02-20 DIAGNOSIS — C50912 Malignant neoplasm of unspecified site of left female breast: Secondary | ICD-10-CM

## 2013-02-20 DIAGNOSIS — Z853 Personal history of malignant neoplasm of breast: Secondary | ICD-10-CM

## 2013-02-20 DIAGNOSIS — D0591 Unspecified type of carcinoma in situ of right breast: Secondary | ICD-10-CM

## 2013-02-20 DIAGNOSIS — Z87898 Personal history of other specified conditions: Secondary | ICD-10-CM

## 2013-02-20 LAB — CBC WITH DIFFERENTIAL/PLATELET
BASO%: 0.5 % (ref 0.0–2.0)
EOS%: 2.9 % (ref 0.0–7.0)
LYMPH%: 44.6 % (ref 14.0–49.7)
MCH: 29.4 pg (ref 25.1–34.0)
MCHC: 33.7 g/dL (ref 31.5–36.0)
MCV: 87.2 fL (ref 79.5–101.0)
MONO%: 8.1 % (ref 0.0–14.0)
Platelets: 264 10*3/uL (ref 145–400)
RBC: 4.58 10*6/uL (ref 3.70–5.45)
RDW: 12.6 % (ref 11.2–14.5)

## 2013-02-20 LAB — LACTATE DEHYDROGENASE (CC13): LDH: 158 U/L (ref 125–245)

## 2013-02-20 LAB — COMPREHENSIVE METABOLIC PANEL (CC13)
ALT: 17 U/L (ref 0–55)
AST: 19 U/L (ref 5–34)
Alkaline Phosphatase: 108 U/L (ref 40–150)
BUN: 8.9 mg/dL (ref 7.0–26.0)
Calcium: 9.7 mg/dL (ref 8.4–10.4)
Chloride: 104 mEq/L (ref 98–107)
Creatinine: 0.7 mg/dL (ref 0.6–1.1)
Total Bilirubin: 0.36 mg/dL (ref 0.20–1.20)

## 2013-02-20 NOTE — Progress Notes (Signed)
[facility]  Telephone:([phone]) [phone] Fax:([phone]  OFFICE PROGRESS NOTE  PATIENT: Jordan Willis   DOB: [birth year]  MR#: [id number]  JYN#:[id number]   QM:[id number],MWUX, MD Leonie Man, M.D. Margaretmary Dys, M.D.  DIAGNOSIS: A 62 year old [place], [place] woman with a history of left breast metaplastic carcinoma diagnosed in 01/2005.   PRIOR THERAPY: 1.  Status post left breast needle core biopsy on 02/02/2005 which showed invasive mammary carcinoma.  2.  Status post bilateral breast MRI on 02/11/2005 which showed that in the upper outer quadrant of the left breast there was a predominantly solid mass with a cystic component.  It had irregular margins and corresponded well with the known malignancy.  It measured 4.4 x 2.6 x 3.7 cm.  Inferior to the known malignancy there was a smaller adjacent mass measuring 1.0 x 0.8 x 0.4 cm.  No abnormal enhancement in the right breast and no enlarged axillary adenopathy.  3.  Status post neoadjuvant chemotherapy with dose dense FEC from 03/02/2005 through 04/27/2005 x 4 cycles with Neulasta support.  This was followed by neoadjuvant chemotherapy with Taxotere from 05/11/2005 through 07/20/2005 x 4 cycles with Neulasta support.  4.  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  5.  Status post radiation therapy from 09/26/2005 through 11/13/2005.  6.  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  7.  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  8.  The patient has a genetics letter dated 04/11/2008 which was 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 (7252C>T)  mutation.  9.  The patient underwent radiation therapy from 05/10/2008 through 07/02/2008.  10.  The patient started antiestrogen therapy with Femara in 06/2008 and then went to Tamoxifen.  Tamoxifen was discontinued in 2010 due a concern about uterine cancer.   CURRENT THERAPY: Observation and annual mammograms and bilateral breast MRI.   INTERVAL HISTORY: Dr. 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 Her last office visit with Dr. Donnie Coffin was on 09/12/2012.  Since her last office visit, the patient reports that she has pain in her lower back due to kidney stones, she had a sinus infection x 3 and is under the care of an ENT, she also has left breast pain and right nipple pain on occasion.  The patient also reports hot flashes, night sweats and vaginal dryness all of which are tolerable.  The patient denies any other symptomatology.  The patient is establishing herself with Dr. Milta Deiters service today.   PAST MEDICAL HISTORY: Past Medical History  Diagnosis Date  . Breast cancer, female, left 03/03/2012  . 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 Menopause 03/03/2012  . Hyperlipidemia   . Arthritis   . Breast cancer     LEFT  . Sinusitis 2014    PAST SURGICAL HISTORY: Past Surgical History  Procedure Laterality Date  . Breast surgery Left 2006    invasive mammary cancer/high grade ductal carcinoma; chemotherapy and radiation  . Breast surgery Right 2009    followed by radiation  . Bilateral salpingoophorectomy Bilateral  FAMILY HISTORY: Family History  Problem Relation Age of Onset  . Cancer Mother   . Diabetes Father   . Cancer Sister     breast cancer  . Cancer Sister     breast cancer    SOCIAL HISTORY: History  Substance Use Topics  . Smoking status: Never Smoker   . Smokeless tobacco: Never Used  . Alcohol Use: No    ALLERGIES: No Known  Allergies   MEDICATIONS:  Current Outpatient Prescriptions  Medication Sig Dispense Refill  . cholecalciferol (VITAMIN D) 1000 UNITS tablet Take 1,000 Units by mouth daily.      . fluticasone (FLONASE) 50 MCG/ACT nasal spray Place 2 sprays into the nose daily.  16 g  6  . gabapentin (NEURONTIN) 100 MG capsule Take 3 capsules (300 mg total) by mouth 3 (three) times daily.  90 capsule  3  . Guaifenesin (MUCINEX MAXIMUM STRENGTH) 1200 MG TB12 Take 1 tablet (1,200 mg total) by mouth every 12 (twelve) hours as needed.  14 tablet  1  . Multiple Vitamin (MULITIVITAMIN WITH MINERALS) TABS Take 1 tablet by mouth daily.      . Omega-3 Fatty Acids (FISH OIL) 1000 MG CAPS Take 1,000 mg by mouth daily.      Marland Kitchen Specialty Vitamins Products (VITAMINS FOR HAIR) TABS Take 2 tablets by mouth daily.      Marland Kitchen zolpidem (AMBIEN) 5 MG tablet Take 1 tablet (5 mg total) by mouth at bedtime as needed.  30 tablet  5   No current facility-administered medications for this visit.      REVIEW OF SYSTEMS: A 10 point review of systems was completed and is negative except as noted above.    PHYSICAL EXAMINATION: BP 155/84  Pulse 74  Temp(Src) 97.9 F (36.6 C) (Oral)  Resp 20  Ht 5\' 8"  (1.727 m)  Wt 196 lb (88.905 kg)  BMI 29.81 kg/m2   General appearance: Alert, cooperative, well nourished, no apparent distress Head: Normocephalic, without obvious abnormality, atraumatic, numerous dental caries Eyes: Arcus senilis, PERRLA, EOMI Nose: Nares, septum and mucosa are normal, no drainage or sinus tenderness Neck: No adenopathy, supple, symmetrical, trachea midline, thyroid not enlarged, no tenderness Resp: Clear to auscultation bilaterally Cardio: Regular rate and rhythm, S1, S2 normal, no murmur, click, rub or gallop Breasts: Left breast and right breast have well-healed surgical scars, architectural changes and radiation changes, firm medial and inframammary ridges bilaterally, glandular tissue bilaterally, no  lymphadenopathy, no nipple inversion, no axilla fullness GI: Soft, distended, non-tender, hypoactive bowel sounds, no organomegaly Extremities: Extremities normal, atraumatic, no cyanosis or edema, limited left upper extremity range of motion Lymph nodes: Cervical, supraclavicular, and axillary nodes normal Neurologic: Grossly normal    ECOG FS:  Grade 1 - Symptomatic but completely ambulatory  LAB RESULTS: Lab Results  Component Value Date   WBC 5.7 02/20/2013   NEUTROABS 2.5 02/20/2013   HGB 13.5 02/20/2013   HCT 39.9 02/20/2013   MCV 87.2 02/20/2013   PLT 264 02/20/2013      Chemistry      Component Value Date/Time   NA 140 02/20/2013 1109   NA 137 07/02/2012 1025   K 3.6 02/20/2013 1109   K 3.8 07/02/2012 1025   CL 104 02/20/2013 1109   CL 103 07/02/2012 1025   CO2 27 02/20/2013 1109   CO2 23 07/02/2012 1025   BUN 8.9 02/20/2013 1109   BUN 15 07/02/2012 1025   CREATININE 0.7 02/20/2013 1109   CREATININE 0.94 07/02/2012  1025   CREATININE 0.64 03/03/2012 1151      Component Value Date/Time   CALCIUM 9.7 02/20/2013 1109   CALCIUM 10.0 07/02/2012 1025   ALKPHOS 108 02/20/2013 1109   ALKPHOS 91 07/02/2012 1025   AST 19 02/20/2013 1109   AST 17 07/02/2012 1025   ALT 17 02/20/2013 1109   ALT 17 07/02/2012 1025   BILITOT 0.36 02/20/2013 1109   BILITOT 0.4 07/02/2012 1025       Lab Results  Component Value Date   LABCA2 37 09/12/2012    No components found with this basename: [id number]     RADIOGRAPHIC STUDIES: No results found.  ASSESSMENT: 62 y.o. [place]: 1.  Status post left breast needle core biopsy on 02/02/2005 which showed invasive mammary carcinoma.  2.  Status post bilateral breast MRI on 02/11/2005 which showed that in the upper outer quadrant of the left breast there was a predominantly solid mass with a cystic component.  It had irregular margins and corresponded well with the known malignancy.  It measured 4.4 x 2.6 x 3.7 cm.  Inferior to the known malignancy  there was a smaller adjacent mass measuring 1.0 x 0.8 x 0.4 cm.  No abnormal enhancement in the right breast and no enlarged axillary adenopathy.  3.  Status post neoadjuvant chemotherapy with dose dense FEC from 03/02/2005 through 04/27/2005 x 4 cycles with Neulasta support.  This was followed by neoadjuvant chemotherapy with Taxotere from 05/11/2005 through 07/20/2005 x 4 cycles with Neulasta support.  4.  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  5.  Status post radiation therapy from 09/26/2005 through 11/13/2005.  6.  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  7.  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  8.  The patient has a genetics letter dated 04/11/2008 which was 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.  9.  The patient underwent radiation therapy from 05/10/2008 through 07/02/2008.  10.  The patient started antiestrogen therapy with Femara in 06/2008 and then went to Tamoxifen.  Tamoxifen was discontinued in 2010 due a concern about uterine cancer.  11.  Limited range of motion in left upper extremity   PLAN: 1.  We plan to see the patient again in 09/2013 after she has annual bilateral breast MRI and mammogram.  We will also check a CBC, CMP, LDH, and vitamin D level at that time.  We will schedule the diagnostic mammogram and bilateral breast MRI for her.  2. The patient and I discussed about her daughter starting to receive mammograms at the age of 24, the importance of them both completing monthly self breast exams, and the possibility of pursuing genetics testing on her daughter.  2.   The patient will be referred to physical therapy for her limited range of motion in the left upper extremity.  All questions were answered.  The patient was encouraged to contact us in the interim with any problems, questions or concerns.    Larina Bras, NP-C 02/23/2013, 9:00 PM

## 2013-02-20 NOTE — Telephone Encounter (Signed)
appts made and printed...td 

## 2013-02-20 NOTE — Patient Instructions (Addendum)
Please contact us at [phone] if you have any questions or concerns.  Health Maintenance, Females  A healthy lifestyle and preventative care can promote health and wellness.  Maintain regular health, dental, and eye exams.  Eat a healthy diet. Foods like vegetables, fruits, whole grains, low-fat dairy products, and lean protein foods contain the nutrients you need without too many calories. Decrease your intake of foods high in solid fats, added sugars, and salt. Get information about a proper diet from your caregiver, if necessary.  Regular physical exercise is one of the most important things you can do for your health. Most adults should get at least 150 minutes of moderate-intensity exercise (any activity that increases your heart rate and causes you to sweat) each week. In addition, most adults need muscle-strengthening exercises on 2 or more days a week.  Maintain a healthy weight. The body mass index (BMI) is a screening tool to identify possible weight problems. It provides an estimate of body fat based on height and weight. Your caregiver can help determine your BMI, and can help you achieve or maintain a healthy weight. For adults 20 years and older:  A BMI below 18.5 is considered underweight.  A BMI of 18.5 to 24.9 is normal.  A BMI of 25 to 29.9 is considered overweight.  A BMI of 30 and above is considered obese.  Maintain normal blood lipids and cholesterol by exercising and minimizing your intake of saturated fat. Eat a balanced diet with plenty of fruits and vegetables. Blood tests for lipids and cholesterol should begin at age 20 and be repeated every 5 years. If your lipid or cholesterol levels are high, you are over 50, or you are a high risk for heart disease, you may need your cholesterol levels checked more frequently. Ongoing high lipid and cholesterol levels should be treated with medicines if diet and exercise are not effective.  If you smoke, find out from your caregiver  how to quit. If you do not use tobacco, do not start.  If you are pregnant, do not drink alcohol. If you are breastfeeding, be very cautious about drinking alcohol. If you are not pregnant and choose to drink alcohol, do not exceed 1 drink per day. One drink is considered to be 12 ounces (355 mL) of beer, 5 ounces (148 mL) of wine, or 1.5 ounces (44 mL) of liquor.  Avoid use of street drugs. Do not share needles with anyone. Ask for help if you need support or instructions about stopping the use of drugs.  High blood pressure causes heart disease and increases the risk of stroke. Blood pressure should be checked at least every 1 to 2 years. Ongoing high blood pressure should be treated with medicines, if weight loss and exercise are not effective.  If you are 55 to 62 years old, ask your caregiver if you should take aspirin to prevent strokes.  Diabetes screening involves taking a blood sample to check your fasting blood sugar level. This should be done once every 3 years, after age 45, if you are within normal weight and without risk factors for diabetes. Testing should be considered at a younger age or be carried out more frequently if you are overweight and have at least 1 risk factor for diabetes.  Breast cancer screening is essential preventative care for women. You should practice "breast self-awareness." This means understanding the normal appearance and feel of your breasts and may include breast self-examination. Any changes detected, no matter how   small, should be reported to a caregiver. Women in their 20s and 30s should have a clinical breast exam (CBE) by a caregiver as part of a regular health exam every 1 to 3 years. After age 40, women should have a CBE every year. Starting at age 40, women should consider having a mammogram (breast X-ray) every year. Women who have a family history of breast cancer should talk to their caregiver about genetic screening. Women at a high risk of breast cancer  should talk to their caregiver about having an MRI and a mammogram every year.  The Pap test is a screening test for cervical cancer. Women should have a Pap test starting at age 21. Between ages 21 and 29, Pap tests should be repeated every 2 years. Beginning at age 30, you should have a Pap test every 3 years as long as the past 3 Pap tests have been normal. If you had a hysterectomy for a problem that was not cancer or a condition that could lead to cancer, then you no longer need Pap tests. If you are between ages 65 and 70, and you have had normal Pap tests going back 10 years, you no longer need Pap tests. If you have had past treatment for cervical cancer or a condition that could lead to cancer, you need Pap tests and screening for cancer for at least 20 years after your treatment. If Pap tests have been discontinued, risk factors (such as a new sexual partner) need to be reassessed to determine if screening should be resumed. Some women have medical problems that increase the chance of getting cervical cancer. In these cases, your caregiver may recommend more frequent screening and Pap tests.  The human papillomavirus (HPV) test is an additional test that may be used for cervical cancer screening. The HPV test looks for the virus that can cause the cell changes on the cervix. The cells collected during the Pap test can be tested for HPV. The HPV test could be used to screen women aged 30 years and older, and should be used in women of any age who have unclear Pap test results. After the age of 30, women should have HPV testing at the same frequency as a Pap test.  Colorectal cancer can be detected and often prevented. Most routine colorectal cancer screening begins at the age of 50 and continues through age 75. However, your caregiver may recommend screening at an earlier age if you have risk factors for colon cancer. On a yearly basis, your caregiver may provide home test kits to check for hidden blood  in the stool. Use of a small camera at the end of a tube, to directly examine the colon (sigmoidoscopy or colonoscopy), can detect the earliest forms of colorectal cancer. Talk to your caregiver about this at age 50, when routine screening begins. Direct examination of the colon should be repeated every 5 to 10 years through age 75, unless early forms of pre-cancerous polyps or small growths are found.  Hepatitis C blood testing is recommended for all people born from 1945 through 1965 and any individual with known risks for hepatitis C.  Practice safe sex. Use condoms and avoid high-risk sexual practices to reduce the spread of sexually transmitted infections (STIs). Sexually active women aged 25 and younger should be checked for Chlamydia, which is a common sexually transmitted infection. Older women with new or multiple partners should also be tested for Chlamydia. Testing for other STIs is recommended if you   are sexually active and at increased risk.  Osteoporosis is a disease in which the bones lose minerals and strength with aging. This can result in serious bone fractures. The risk of osteoporosis can be identified using a bone density scan. Women ages 65 and over and women at risk for fractures or osteoporosis should discuss screening with their caregivers. Ask your caregiver whether you should be taking a calcium supplement or vitamin D to reduce the rate of osteoporosis.  Menopause can be associated with physical symptoms and risks. Hormone replacement therapy is available to decrease symptoms and risks. You should talk to your caregiver about whether hormone replacement therapy is right for you.  Use sunscreen with a sun protection factor (SPF) of 30 or greater. Apply sunscreen liberally and repeatedly throughout the day. You should seek shade when your shadow is shorter than you. Protect yourself by wearing long sleeves, pants, a wide-brimmed hat, and sunglasses year round, whenever you are  outdoors.  Notify your caregiver of new moles or changes in moles, especially if there is a change in shape or color. Also notify your caregiver if a mole is larger than the size of a pencil eraser.  Stay current with your immunizations.  Document Released: 04/23/2011 Document Revised: 12/31/2011 Document Reviewed: 04/23/2011 [employer] Patient Information 2013 [employer].  

## 2013-02-21 LAB — VITAMIN D 25 HYDROXY (VIT D DEFICIENCY, FRACTURES): Vit D, 25-Hydroxy: 41 ng/mL (ref 30–89)

## 2013-02-24 ENCOUNTER — Telehealth: Payer: Self-pay | Admitting: Medical Oncology

## 2013-02-24 NOTE — Telephone Encounter (Signed)
Per Larina Bras, NP informed pt results of her labs 05/02 look good and for pt to continue with her vit d and multivitamin. Pt expressed verbal understanding, no questions at this time. Knows to call office with questions or concerns.

## 2013-03-04 ENCOUNTER — Ambulatory Visit: Payer: 59 | Admitting: Physical Therapy

## 2013-03-13 ENCOUNTER — Ambulatory Visit: Payer: 59 | Attending: Family | Admitting: Physical Therapy

## 2013-03-13 ENCOUNTER — Ambulatory Visit: Payer: 59 | Admitting: Oncology

## 2013-03-13 ENCOUNTER — Other Ambulatory Visit: Payer: 59 | Admitting: Lab

## 2013-03-13 DIAGNOSIS — Z9010 Acquired absence of unspecified breast and nipple: Secondary | ICD-10-CM | POA: Insufficient documentation

## 2013-03-13 DIAGNOSIS — IMO0001 Reserved for inherently not codable concepts without codable children: Secondary | ICD-10-CM | POA: Insufficient documentation

## 2013-03-13 DIAGNOSIS — M24519 Contracture, unspecified shoulder: Secondary | ICD-10-CM | POA: Insufficient documentation

## 2013-03-13 DIAGNOSIS — M25519 Pain in unspecified shoulder: Secondary | ICD-10-CM | POA: Insufficient documentation

## 2013-03-23 ENCOUNTER — Ambulatory Visit: Payer: 59 | Admitting: Physical Therapy

## 2013-03-27 ENCOUNTER — Encounter: Payer: 59 | Admitting: Physical Therapy

## 2013-03-30 ENCOUNTER — Ambulatory Visit: Payer: 59 | Attending: Family | Admitting: Physical Therapy

## 2013-03-30 DIAGNOSIS — Z9010 Acquired absence of unspecified breast and nipple: Secondary | ICD-10-CM | POA: Insufficient documentation

## 2013-03-30 DIAGNOSIS — M25519 Pain in unspecified shoulder: Secondary | ICD-10-CM | POA: Insufficient documentation

## 2013-03-30 DIAGNOSIS — IMO0001 Reserved for inherently not codable concepts without codable children: Secondary | ICD-10-CM | POA: Insufficient documentation

## 2013-03-30 DIAGNOSIS — M24519 Contracture, unspecified shoulder: Secondary | ICD-10-CM | POA: Insufficient documentation

## 2013-04-03 ENCOUNTER — Ambulatory Visit: Payer: 59

## 2013-06-09 ENCOUNTER — Telehealth: Payer: Self-pay | Admitting: Oncology

## 2013-06-09 NOTE — Telephone Encounter (Signed)
Received call from [facility] at [facility] pt needing lb before 12/17 mri. Moved 12/22 lb to 12/17 @ 9am before 10am mri. Sheralyn Boatman will inform pt of lb appt. F/u appt remains 12/22.

## 2013-08-04 ENCOUNTER — Telehealth: Payer: Self-pay | Admitting: *Deleted

## 2013-08-04 NOTE — Telephone Encounter (Signed)
Message copied by Cooper Render on Tue Aug 04, 2013  5:49 PM ------      Message from: Victorino December      Created: Tue Aug 04, 2013  5:28 PM      Regarding: RE: breast pain- comes and goes      Contact: [phone]       I have reviewed patients chart and with the information we have this pain unlikely due to recurrence of breast cancer, this may be related to heer prior surgery/radiation            Please have mammogram moved to October      ----- Message -----         From: Jolinda Croak, RN         Sent: 08/04/2013   5:11 PM           To: Victorino December, MD, Illa Level, NP      Subject: breast pain- comes and goes                              Pt called states " I have pain in my left breast it comes and goes. It goes away when I take my pain medication.Should I just wait to see Dr. Welton Flakes and keep taking my pain medicine?"            Pt has mammo on 12/15 and MD f/u  12/22.      Please advise thanks,      Corrie Dandy       ------

## 2013-08-04 NOTE — Telephone Encounter (Signed)
Per MD, notified pt pain unlikely related to recurrence of breast cancer, may be from prior surgery or radiation. Pt to expect a call moving her Mammo up from appt in dec to October. Pt to call back to confirm message received/further concerns. POF to scheduling.

## 2013-08-05 ENCOUNTER — Other Ambulatory Visit: Payer: Self-pay | Admitting: Oncology

## 2013-08-05 ENCOUNTER — Telehealth: Payer: Self-pay | Admitting: Oncology

## 2013-08-05 DIAGNOSIS — N644 Mastodynia: Secondary | ICD-10-CM

## 2013-08-05 DIAGNOSIS — Z853 Personal history of malignant neoplasm of breast: Secondary | ICD-10-CM

## 2013-08-05 NOTE — Telephone Encounter (Signed)
, °

## 2013-09-10 ENCOUNTER — Telehealth: Payer: Self-pay | Admitting: Oncology

## 2013-10-05 ENCOUNTER — Ambulatory Visit
Admission: RE | Admit: 2013-10-05 | Discharge: 2013-10-05 | Disposition: A | Discharge status: Home / Self Care | Payer: 59 | Source: Ambulatory Visit | Attending: Family | Admitting: Family

## 2013-10-05 DIAGNOSIS — C50912 Malignant neoplasm of unspecified site of left female breast: Secondary | ICD-10-CM

## 2013-10-05 DIAGNOSIS — Z853 Personal history of malignant neoplasm of breast: Secondary | ICD-10-CM

## 2013-10-05 DIAGNOSIS — D0591 Unspecified type of carcinoma in situ of right breast: Secondary | ICD-10-CM

## 2013-10-06 ENCOUNTER — Ambulatory Visit (HOSPITAL_BASED_OUTPATIENT_CLINIC_OR_DEPARTMENT_OTHER): Payer: 59

## 2013-10-06 DIAGNOSIS — C50912 Malignant neoplasm of unspecified site of left female breast: Secondary | ICD-10-CM

## 2013-10-06 DIAGNOSIS — D0591 Unspecified type of carcinoma in situ of right breast: Secondary | ICD-10-CM

## 2013-10-06 DIAGNOSIS — Z853 Personal history of malignant neoplasm of breast: Secondary | ICD-10-CM

## 2013-10-06 LAB — COMPREHENSIVE METABOLIC PANEL (CC13)
Albumin: 4.4 g/dL (ref 3.5–5.0)
Anion Gap: 12 mEq/L — ABNORMAL HIGH (ref 3–11)
BUN: 10.4 mg/dL (ref 7.0–26.0)
CO2: 24 mEq/L (ref 22–29)
Glucose: 106 mg/dl (ref 70–140)
Potassium: 3.6 mEq/L (ref 3.5–5.1)
Sodium: 139 mEq/L (ref 136–145)
Total Bilirubin: 0.24 mg/dL (ref 0.20–1.20)
Total Protein: 8.6 g/dL — ABNORMAL HIGH (ref 6.4–8.3)

## 2013-10-06 LAB — CBC WITH DIFFERENTIAL/PLATELET
Basophils Absolute: 0.1 10*3/uL (ref 0.0–0.1)
EOS%: 3.8 % (ref 0.0–7.0)
HCT: 43.2 % (ref 34.8–46.6)
HGB: 14.9 g/dL (ref 11.6–15.9)
LYMPH%: 42.4 % (ref 14.0–49.7)
MCH: 30.7 pg (ref 25.1–34.0)
NEUT%: 45.7 % (ref 38.4–76.8)
Platelets: 295 10*3/uL (ref 145–400)
lymph#: 2.6 10*3/uL (ref 0.9–3.3)

## 2013-10-07 ENCOUNTER — Ambulatory Visit (HOSPITAL_COMMUNITY)
Admission: RE | Admit: 2013-10-07 | Discharge: 2013-10-07 | Disposition: A | Discharge status: Home / Self Care | Payer: 59 | Source: Ambulatory Visit | Attending: Family | Admitting: Family

## 2013-10-07 ENCOUNTER — Other Ambulatory Visit: Payer: 59

## 2013-10-07 DIAGNOSIS — L905 Scar conditions and fibrosis of skin: Secondary | ICD-10-CM | POA: Insufficient documentation

## 2013-10-07 DIAGNOSIS — Z853 Personal history of malignant neoplasm of breast: Secondary | ICD-10-CM

## 2013-10-07 DIAGNOSIS — Z09 Encounter for follow-up examination after completed treatment for conditions other than malignant neoplasm: Secondary | ICD-10-CM | POA: Insufficient documentation

## 2013-10-07 DIAGNOSIS — C50912 Malignant neoplasm of unspecified site of left female breast: Secondary | ICD-10-CM

## 2013-10-07 DIAGNOSIS — Z923 Personal history of irradiation: Secondary | ICD-10-CM | POA: Insufficient documentation

## 2013-10-07 DIAGNOSIS — D0591 Unspecified type of carcinoma in situ of right breast: Secondary | ICD-10-CM

## 2013-10-07 MED ORDER — GADOBENATE DIMEGLUMINE 529 MG/ML IV SOLN
19.0000 mL | Freq: Once | INTRAVENOUS | Status: AC | PRN
  Administered 2013-10-07: 20 mL via INTRAVENOUS

## 2013-10-08 ENCOUNTER — Ambulatory Visit (HOSPITAL_BASED_OUTPATIENT_CLINIC_OR_DEPARTMENT_OTHER): Payer: 59 | Admitting: Oncology

## 2013-10-08 ENCOUNTER — Encounter: Payer: Self-pay | Admitting: Oncology

## 2013-10-08 DIAGNOSIS — Z1501 Genetic susceptibility to malignant neoplasm of breast: Secondary | ICD-10-CM

## 2013-10-08 DIAGNOSIS — E559 Vitamin D deficiency, unspecified: Secondary | ICD-10-CM

## 2013-10-08 DIAGNOSIS — D0511 Intraductal carcinoma in situ of right breast: Secondary | ICD-10-CM

## 2013-10-08 DIAGNOSIS — C50912 Malignant neoplasm of unspecified site of left female breast: Secondary | ICD-10-CM

## 2013-10-08 DIAGNOSIS — Z853 Personal history of malignant neoplasm of breast: Secondary | ICD-10-CM

## 2013-10-08 NOTE — Patient Instructions (Signed)
BRCA-1 and BRCA-2 BRCA-1 and BRCA-2 are 2 genes that are linked with hereditary breast and ovarian cancers. About 200,000 women are diagnosed with invasive breast cancer each year and about 23,000 with ovarian cancer (according to the American Cancer Society). Of these cancers, about 5% to 10% will be due to a mutation in one of the BRCA genes. Men can also inherit an increased risk of developing breast cancer, primarily from an alteration in the BRCA-2 gene.  Individuals with mutations in BRCA1 or BRCA2 have significantly elevated risks for breast cancer (up to 80% lifetime risk), ovarian cancer (up to 40% lifetime risk), bilateral breast cancer and other types of cancers. BRCA mutations are inherited and passed from generation to generation. One half of the time, they are passed from the father's side of the family.  The DNA in white blood cells is used to detect mutations in the BRCA genes. While the gene products (proteins) of the BRCA genes act only in breast and ovarian tissue, the genes are present in every cell of the body and blood is the most easily accessible source of that DNA. PREPARATION FOR TEST The test for BRCA mutations is done on a blood sample collected by needle from a vein in the arm. The test does not require surgical biopsy of breast or ovarian tissue.  NORMAL FINDINGS No genetic mutations. Ranges for normal findings may vary among different laboratories and hospitals. You should always check with your doctor after having lab work or other tests done to discuss the meaning of your test results and whether your values are considered within normal limits. MEANING OF TEST  Your caregiver will go over the test results with you and discuss the importance and meaning of your results, as well as treatment options and the need for additional tests if necessary. OBTAINING THE TEST RESULTS It is your responsibility to obtain your test results. Ask the lab or department performing the test  when and how you will get your results. OTHER THINGS TO KNOW Your test results may have implications for other family members. When one member of a family is tested for BRCA mutations, issues often arise about how or whether to share this information with other family members. Seek advice from a genetic counselor about communication of result with your family members.  Pre and post test consultation with a health care provider knowledgeable about genetic testing cannot be overemphasized.  There are many issues to be considered when preparing for a genetic test and upon learning the results, and a genetic counselor has the knowledge and experience to help you sort through them.  If the BRCA test is positive, the options include increased frequency of check-ups (e.g., mammography, blood tests for CA-125, or transvaginal ultrasonography); medications that could reduce risk (e.g., oral contraceptives or tamoxifen); or surgical removal of the ovaries or breasts. There are a number of variables involved and it is important to discuss your options with your doctor and genetic counselor. Research studies have reported that for every 1000 women negative for BRCA mutations, between 12 and 45 of them will develop breast cancer by age 50 and between 3 and 4 will develop ovarian cancer by age 50. The risk increases with age. The test can be ordered by a doctor, preferably by one who can also offer genetic counseling. The blood sample will be sent to a laboratory that specializes in BRCA testing. The American Society of Clinical Oncology and the National Breast Cancer Coalition encourage women seeking the   test to participate in long-term outcome studies to help gather information on the effectiveness of different check-up and treatment options. Document Released: 11/01/2004 Document Revised: 12/31/2011 Document Reviewed: 09/13/2008 ExitCare Patient Information 2014 [employer].  

## 2013-10-08 NOTE — Progress Notes (Signed)
[facility]  [phone] [phone]  OFFICE PROGRESS NOTE  PATIENT: Jordan Willis   DOB: 27-Apr-1951  MR#: [id number]  UXL#:[id number]   ZD:[id number],VZDG, MD Leonie Man, M.D. Margaretmary Dys, M.D.  DIAGNOSIS: A 62 year old [place], [place] woman with a history of left breast metaplastic carcinoma diagnosed in 01/2005.   PRIOR THERAPY: 1.  Status post left breast needle core biopsy on 02/02/2005 which showed invasive mammary carcinoma.  2.  Status post bilateral breast MRI on 02/11/2005 which showed that in the upper outer quadrant of the left breast there was a predominantly solid mass with a cystic component.  It had irregular margins and corresponded well with the known malignancy.  It measured 4.4 x 2.6 x 3.7 cm.  Inferior to the known malignancy there was a smaller adjacent mass measuring 1.0 x 0.8 x 0.4 cm.  No abnormal enhancement in the right breast and no enlarged axillary adenopathy.  3.  Status post neoadjuvant chemotherapy with dose dense FEC from 03/02/2005 through 04/27/2005 x 4 cycles with Neulasta support.  This was followed by neoadjuvant chemotherapy with Taxotere from 05/11/2005 through 07/20/2005 x 4 cycles with Neulasta support.  4.  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  5.  Status post radiation therapy from 09/26/2005 through 11/13/2005.  6.  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  7.  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  8.  The patient has a genetics letter dated 04/11/2008 which was 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 (7252C>T)  mutation.  9.  The patient underwent radiation therapy from 05/10/2008 through 07/02/2008.  10.  The patient started antiestrogen therapy with Femara in 06/2008 and then went to Tamoxifen.  Tamoxifen was discontinued in 2010 due a concern about uterine cancer.   CURRENT THERAPY: Observation and annual mammograms and bilateral breast MRI.   INTERVAL HISTORY: Patient is seen in followup today at six-month visit. Overall she's doing well. She her mammogram and MRI performed to look good without any evidence of recurrent disease. Patient does continue to have significant aches and pains. She is very tired. 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 She does have some breast tenderness which has been ongoing since surgery. He denies any headaches double vision blurring of vision fevers chills night sweats. No shortness of breath. Remainder of the 10 point review of systems is negative.   PAST MEDICAL HISTORY: Past Medical History  Diagnosis Date  . Breast cancer, female, left 03/03/2012  . 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 Menopause 03/03/2012  . Hyperlipidemia   . Arthritis   . Breast cancer     LEFT  . Sinusitis 2014    PAST SURGICAL HISTORY: Past Surgical History  Procedure Laterality Date  . Breast surgery Left 2006    invasive mammary cancer/high grade ductal carcinoma; chemotherapy and radiation  . Breast surgery Right 2009    followed by radiation  . Bilateral salpingoophorectomy Bilateral     FAMILY HISTORY: Family History  Problem Relation Age of Onset  . Cancer Mother   .  Diabetes Father   . Cancer Sister     breast cancer  . Cancer Sister     breast cancer    SOCIAL HISTORY: History  Substance Use Topics  . Smoking status: Never Smoker   . Smokeless tobacco: Never Used  . Alcohol Use: No    ALLERGIES: No Known Allergies   MEDICATIONS:  Current Outpatient Prescriptions   Medication Sig Dispense Refill  . cholecalciferol (VITAMIN D) 1000 UNITS tablet Take 1,000 Units by mouth daily.      . Multiple Vitamin (MULITIVITAMIN WITH MINERALS) TABS Take 1 tablet by mouth daily.      . Omega-3 Fatty Acids (FISH OIL) 1000 MG CAPS Take 1,000 mg by mouth daily.      . fluticasone (FLONASE) 50 MCG/ACT nasal spray Place 2 sprays into the nose daily.  16 g  6  . Guaifenesin (MUCINEX MAXIMUM STRENGTH) 1200 MG TB12 Take 1 tablet (1,200 mg total) by mouth every 12 (twelve) hours as needed.  14 tablet  1  . zolpidem (AMBIEN) 5 MG tablet Take 1 tablet (5 mg total) by mouth at bedtime as needed.  30 tablet  5   No current facility-administered medications for this visit.      REVIEW OF SYSTEMS: A 10 point review of systems was completed and is negative except as noted above.    PHYSICAL EXAMINATION: There were no vitals taken for this visit.   General appearance: Alert, cooperative, well nourished, no apparent distress Head: Normocephalic, without obvious abnormality, atraumatic, numerous dental caries Eyes: Arcus senilis, PERRLA, EOMI Nose: Nares, septum and mucosa are normal, no drainage or sinus tenderness Neck: No adenopathy, supple, symmetrical, trachea midline, thyroid not enlarged, no tenderness Resp: Clear to auscultation bilaterally Cardio: Regular rate and rhythm, S1, S2 normal, no murmur, click, rub or gallop Breasts: Left breast and right breast have well-healed surgical scars, architectural changes and radiation changes, firm medial and inframammary ridges bilaterally, glandular tissue bilaterally, no lymphadenopathy, no nipple inversion, no axilla fullness GI: Soft, distended, non-tender, hypoactive bowel sounds, no organomegaly Extremities: Extremities normal, atraumatic, no cyanosis or edema, limited left upper extremity range of motion Lymph nodes: Cervical, supraclavicular, and axillary nodes normal Neurologic: Grossly normal    ECOG FS:  Grade 1 -  Symptomatic but completely ambulatory  LAB RESULTS: Lab Results  Component Value Date   WBC 6.0 10/06/2013   NEUTROABS 2.8 10/06/2013   HGB 14.9 10/06/2013   HCT 43.2 10/06/2013   MCV 88.9 10/06/2013   PLT 295 10/06/2013      Chemistry      Component Value Date/Time   NA 139 10/06/2013 0900   NA 137 07/02/2012 1025   K 3.6 10/06/2013 0900   K 3.8 07/02/2012 1025   CL 104 02/20/2013 1109   CL 103 07/02/2012 1025   CO2 24 10/06/2013 0900   CO2 23 07/02/2012 1025   BUN 10.4 10/06/2013 0900   BUN 15 07/02/2012 1025   CREATININE 0.7 10/06/2013 0900   CREATININE 0.94 07/02/2012 1025   CREATININE 0.64 03/03/2012 1151      Component Value Date/Time   CALCIUM 10.3 10/06/2013 0900   CALCIUM 10.0 07/02/2012 1025   ALKPHOS 133 10/06/2013 0900   ALKPHOS 91 07/02/2012 1025   AST 22 10/06/2013 0900   AST 17 07/02/2012 1025   ALT 25 10/06/2013 0900   ALT 17 07/02/2012 1025   BILITOT 0.24 10/06/2013 0900   BILITOT 0.4 07/02/2012 1025       Lab Results  Component Value Date   LABCA2 37 09/12/2012    No components found with this basename: [id number]     RADIOGRAPHIC STUDIES: No results found.  ASSESSMENT: 62 y.o. [place], [place]: 1.  Status post left breast needle core biopsy on 02/02/2005 which showed invasive mammary carcinoma.  2.  Status post bilateral breast MRI on 02/11/2005 which showed that in the upper outer quadrant of the left breast there was a predominantly solid mass with a cystic component.  It had irregular margins and corresponded well with the known malignancy.  It measured 4.4 x 2.6 x 3.7 cm.  Inferior to the known malignancy there was a smaller adjacent mass measuring 1.0 x 0.8 x 0.4 cm.  No abnormal enhancement in the right breast and no enlarged axillary adenopathy.  3.  Status post neoadjuvant chemotherapy with dose dense FEC from 03/02/2005 through 04/27/2005 x 4 cycles with Neulasta support.  This was followed by neoadjuvant chemotherapy with  Taxotere from 05/11/2005 through 07/20/2005 x 4 cycles with Neulasta support.  4.  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  5.  Status post radiation therapy from 09/26/2005 through 11/13/2005.  6.  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  7.  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  8.  The patient has a genetics letter dated 04/11/2008 which was positive for a deleterious mutation BRCA2  [id number] (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.  9.  The patient underwent radiation therapy from 05/10/2008 through 07/02/2008.  10.  The patient started antiestrogen therapy with Femara in 06/2008 and then went to Tamoxifen.  Tamoxifen was discontinued in 2010 due a concern about uterine cancer.  11.  Limited range of motion in left upper extremity   PLAN: 62 year old female with bilateral breast cancers. At this time recommendation is to continue to see the patient once a year. She will also continue to have mammograms and MRIs once a year. We discussed physical exercise as well as stress reduction. I have set her up for MRI and mammograms prior to her next visit as well as blood work.  All questions were answered.  The patient was encouraged to contact us in the interim with any problems, questions or concerns.   Drue Second, MD Medical/Oncology [facility] [phone] (beeper) [phone] (Office)  10/08/2013, 6:01 PM

## 2013-10-09 ENCOUNTER — Telehealth: Payer: Self-pay | Admitting: Oncology

## 2013-10-09 NOTE — Telephone Encounter (Signed)
,  l

## 2013-10-11 ENCOUNTER — Ambulatory Visit (INDEPENDENT_AMBULATORY_CARE_PROVIDER_SITE_OTHER): Payer: 59 | Admitting: Family Medicine

## 2013-10-11 ENCOUNTER — Encounter: Payer: Self-pay | Admitting: Family Medicine

## 2013-10-11 VITALS — BP 118/76 | HR 77 | Temp 98.3°F | Resp 16 | Ht 67.5 in | Wt 192.0 lb

## 2013-10-11 DIAGNOSIS — Z23 Encounter for immunization: Secondary | ICD-10-CM

## 2013-10-11 DIAGNOSIS — G4700 Insomnia, unspecified: Secondary | ICD-10-CM

## 2013-10-11 DIAGNOSIS — Z0000 Encounter for general adult medical examination without abnormal findings: Secondary | ICD-10-CM

## 2013-10-11 LAB — POCT URINALYSIS DIPSTICK
Bilirubin, UA: NEGATIVE
Blood, UA: NEGATIVE
Glucose, UA: NEGATIVE
Ketones, UA: NEGATIVE
Leukocytes, UA: NEGATIVE
Nitrite, UA: NEGATIVE
Spec Grav, UA: 1.02
Urobilinogen, UA: 0.2

## 2013-10-11 LAB — LIPID PANEL
Cholesterol: 311 mg/dL — ABNORMAL HIGH (ref 0–200)
Total CHOL/HDL Ratio: 6.2 Ratio
Triglycerides: 114 mg/dL (ref <150)
VLDL: 23 mg/dL (ref 0–40)

## 2013-10-11 LAB — TSH: TSH: 0.599 u[IU]/mL (ref 0.350–4.500)

## 2013-10-11 LAB — HEMOGLOBIN A1C: Mean Plasma Glucose: 148 mg/dL — ABNORMAL HIGH (ref <117)

## 2013-10-11 MED ORDER — ZOSTER VACCINE LIVE 19400 UNT/0.65ML ~~LOC~~ SOLR: 0.6500 mL | Freq: Once | SUBCUTANEOUS | Status: DC

## 2013-10-11 MED ORDER — ZOLPIDEM TARTRATE 5 MG PO TABS: 5.0000 mg | ORAL_TABLET | Freq: Every evening | ORAL | Status: DC | PRN

## 2013-10-11 NOTE — Progress Notes (Addendum)
Subjective:    Patient ID: Jordan Willis, female    DOB: 1951-03-08, 62 y.o.   MRN: [id number] This chart was scribed for Jordan Chick, MD by Danella Maiers, ED Scribe. This patient was seen in room 2 and the patient's care was started at 9:34 AM.  Chief Complaint  Patient presents with  . Annual Exam    HPI HPI Comments: Jordan Willis is a 62 y.o. female with a h/o breast cancer, arthritis, kidney stones who presents to the [facility] for an annual physical exam. Last physical was 07/02/12 by Jordan Willis. Her last pap smear was a year ago by Jordan Willis. Her last mammogram was 10/05/13 and normal. Her last colonoscopy was 3 years ago by Jordan Willis and normal. She is due to repeat in 2 years. She had a bone density scan 1 year ago through the cancer center. Her last tetanus shot was one year ago. She had a pneumonia shot in 2013. She has not had the shingles vaccine and she wants one. She has not had a flu shot and she wants one today. She had an eye exam 2 weeks ago. She did not have glaucoma or cataracts. She had a dental exam 2 weeks ago and was normal.  She states her arthritis is in her lower and middle back. Recently she has been aching in her joints. She also reports lower back pain intermittently due to kidney stones. She has an 8 year h/o kidney stones but has not been to see her urologist recently.  She denies numbness but reports tingling in her right arm from repetitive motions at work. She states it is worse at rest but when she moves her arm around it goes away. She denies mouth sores, tinnitus, frequent headaches, CP, SOB, cough.  She would like refill of muscle relaxer for muscle spasms.  She has a h/o breast cancer lumpectomy surgeries in 2005 and 2009. She has had chemo and radiation therapy. She had her ovaries removed. She still has her uterus and her cervix.   Her father has passed in his 70s from diabetes. He had both legs and one arm amputated. He had  no other health problems. Her mother is living and was diagnosed with breast cancer at 47. Mother has HTN but no other health problems. She has 2 sisters who both had breast cancer this year. They are all positive for the BRCA gene. Sisters have no other health problems. Her brother has obesity and sleep apnea.  Pt is divorced and dating. She has 2 children and one grandchild. She works at [employer]. She is thinking about going to part-time because she is getting tired and wants to spend time with her mom. She works night shifts and states she has a hard time sleeping during the day. She reports more irritability than usual at work dealing with her coworkers that aren't doing what they are supposed to be doing. She denies feeling depression. She lives alone. She denies smoking or drinking alcohol. She exercises some - walks with her sister. She wears a seatbelt always. She has no guns in the house.   She takes Vitamin D, Flonase, Multivitamin, fish oil x2 every day. She takes Mucinex as needed. She has been taking Ambien 1-2 times per week and is out of it. She uses melatonin the nights that she does not take Ambien. She gets up 3-4 times during sleep to use the bathroom. She normally  goes to bed no later than 2pm and she has to get up at 9pm.   Past Medical History  Diagnosis Date  . Breast cancer, female, left 03/03/2012  . 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 Menopause 03/03/2012  . Hyperlipidemia   . Breast cancer     LEFT  . Sinusitis 2014  . Arthritis     Lumbar spine DDD.  Marland Kitchen Nephrolithiasis    Family History  Problem Relation Age of Onset  . Cancer Mother 36    breast cancer  . Hypertension Mother   . Diabetes Father   . Cancer Sister     breast cancer  . Cancer Sister     breast cancer   History  Substance Use Topics  . Smoking status: Never Smoker   . Smokeless tobacco: Never Used  . Alcohol Use: No   Current Outpatient Prescriptions on  File Prior to Visit  Medication Sig Dispense Refill  . cholecalciferol (VITAMIN D) 1000 UNITS tablet Take 1,000 Units by mouth daily.      . Multiple Vitamin (MULITIVITAMIN WITH MINERALS) TABS Take 1 tablet by mouth daily.      . Omega-3 Fatty Acids (FISH OIL) 1000 MG CAPS Take 1,000 mg by mouth daily.      . fluticasone (FLONASE) 50 MCG/ACT nasal spray Place 2 sprays into the nose daily.  16 g  6   No current facility-administered medications on file prior to visit.   No Known Allergies    Review of Systems  HENT: Negative for ear pain, mouth sores and tinnitus.   Eyes: Negative for visual disturbance.  Respiratory: Negative for cough and shortness of breath.   Cardiovascular: Negative for chest pain, palpitations and leg swelling.  Gastrointestinal: Negative for nausea, vomiting, abdominal pain, diarrhea, constipation, blood in stool, anal bleeding and rectal pain.  Genitourinary: Positive for frequency. Negative for dysuria and flank pain.  Musculoskeletal: Positive for back pain and myalgias. Negative for arthralgias, neck pain and neck stiffness.  Skin: Negative for rash.  Neurological: Negative for dizziness, syncope, weakness, numbness and headaches.  Psychiatric/Behavioral: Positive for sleep disturbance. Negative for dysphoric mood and decreased concentration. The patient is nervous/anxious.        Objective:   Physical Exam  Nursing note and vitals reviewed. Constitutional: She is oriented to person, place, and time. She appears well-developed and well-nourished. No distress.  HENT:  Head: Normocephalic and atraumatic.  Right Ear: Tympanic membrane and external ear normal.  Left Ear: Tympanic membrane and external ear normal.  Nose: Nose normal.  Mouth/Throat: Oropharynx is clear and moist.  Eyes: Conjunctivae and EOM are normal. Pupils are equal, round, and reactive to light.  Neck: Normal range of motion. Neck supple. Carotid bruit is not present. No tracheal  deviation present. No thyromegaly present.  Cardiovascular: Normal rate, regular rhythm, normal heart sounds and intact distal pulses.   Pulmonary/Chest: Effort normal and breath sounds normal. No respiratory distress. She has no wheezes. She has no rales.  Abdominal: Soft. Bowel sounds are normal. She exhibits no distension and no mass. There is no tenderness. There is no rebound and no guarding.  Musculoskeletal: Normal range of motion.  Lymphadenopathy:    She has no cervical adenopathy.  Neurological: She is alert and oriented to person, place, and time. No cranial nerve deficit. She exhibits normal muscle tone. Coordination normal.  Skin: Skin is warm and dry. No rash noted. She is not diaphoretic.  Psychiatric: She has  a normal mood and affect. Her behavior is normal. Judgment and thought content normal.     Filed Vitals:   10/11/13 0835  BP: 118/76  Pulse: 77  Temp: 98.3 F (36.8 C)  TempSrc: Oral  Resp: 16  Height: 5' 7.5" (1.715 m)  Weight: 192 lb (87.091 kg)  SpO2: 99%   Results for orders placed in visit on 10/11/13  POCT URINALYSIS DIPSTICK      Result Value Range   Color, UA yellow     Clarity, UA clear     Glucose, UA neg     Bilirubin, UA neg     Ketones, UA neg     Spec Grav, UA 1.020     Blood, UA neg     pH, UA 5.5     Protein, UA neg     Urobilinogen, UA 0.2     Nitrite, UA neg     Leukocytes, UA Negative     EKG: NSR.  S/p influenza vaccine by Johnella Moloney.      Assessment & Plan:   1. Need for shingles vaccine   2. Annual physical exam   3. Need for prophylactic vaccination and inoculation against influenza   4. Insomnia    1. CPE: anticipatory guidance --- exercise, weight loss.  Pap smear, Mammogram, colonoscopy UTD.  S/p flu vaccine in office; rx for Zostavax provided.  Obtain labs.   2.  S/p flu vaccine. 3.  Zostavax rx provided. 4. Insomnia:  Controlled with Ambien PRN.  Shift work related insomnia. 5.  DDD lumbar spine: persistent; rx  for Skelaxin provided.  Also recommend Tylenol PRN and frequent exercise. 6. Breast cancer: stable; followed closely by oncology.  Meds ordered this encounter  Medications  . zoster vaccine live, PF, (ZOSTAVAX) 62952 UNT/0.65ML injection    Sig: Inject 19,400 Units into the skin once.    Dispense:  1 each    Refill:  0  . zolpidem (AMBIEN) 5 MG tablet    Sig: Take 1 tablet (5 mg total) by mouth at bedtime as needed.    Dispense:  30 tablet    Refill:  5    Order Specific Question:  Supervising Provider    Answer:  DOOLITTLE, ROBERT P [[id number]]    I personally performed the services described in this documentation, which was scribed in my presence.  The recorded information has been reviewed and is accurate.  Nilda Simmer, M.D.  [facility] [address] [place], [place]  [address] [phone] phone [phone] fax

## 2013-10-12 ENCOUNTER — Ambulatory Visit: Payer: 59 | Admitting: Oncology

## 2013-10-12 ENCOUNTER — Other Ambulatory Visit: Payer: 59 | Admitting: Lab

## 2013-10-12 LAB — VITAMIN D 25 HYDROXY (VIT D DEFICIENCY, FRACTURES): Vit D, 25-Hydroxy: 40 ng/mL (ref 30–89)

## 2013-10-16 ENCOUNTER — Telehealth: Payer: Self-pay

## 2013-10-16 NOTE — Telephone Encounter (Signed)
Pt states that her and Dr.Smith discussed getting a rx for a muscle relaxer, the patient states that she did not receive the rx for that. Best# [phone] [facility].

## 2013-10-19 MED ORDER — METAXALONE 800 MG PO TABS: 800.0000 mg | ORAL_TABLET | Freq: Three times a day (TID) | ORAL | Status: DC

## 2013-10-19 NOTE — Telephone Encounter (Signed)
Ov note indicates skelaxin, have sent for her.

## 2013-10-28 ENCOUNTER — Encounter: Payer: Self-pay | Admitting: Radiology

## 2013-10-30 ENCOUNTER — Telehealth: Payer: Self-pay | Admitting: Family Medicine

## 2013-10-30 NOTE — Telephone Encounter (Signed)
Message copied by Chinita Pester on Fri Oct 30, 2013  4:44 PM ------      Message from: Wardell Honour      Created: Wed Oct 28, 2013  9:24 AM       Schedulers -- - please schedule OV with me in upcoming 1-4 months to discuss new onset diabetes ------

## 2013-10-30 NOTE — Telephone Encounter (Signed)
Appt made with Dr. Carlean Jews per her request for March.

## 2013-11-24 ENCOUNTER — Ambulatory Visit: Payer: 59

## 2013-11-24 ENCOUNTER — Ambulatory Visit (INDEPENDENT_AMBULATORY_CARE_PROVIDER_SITE_OTHER): Payer: 59 | Admitting: Internal Medicine

## 2013-11-24 ENCOUNTER — Telehealth: Payer: Self-pay

## 2013-11-24 VITALS — BP 130/80 | HR 103 | Temp 98.3°F | Resp 18 | Ht 67.5 in | Wt 192.0 lb

## 2013-11-24 DIAGNOSIS — J3489 Other specified disorders of nose and nasal sinuses: Secondary | ICD-10-CM

## 2013-11-24 DIAGNOSIS — R05 Cough: Secondary | ICD-10-CM

## 2013-11-24 DIAGNOSIS — R0981 Nasal congestion: Secondary | ICD-10-CM

## 2013-11-24 DIAGNOSIS — R109 Unspecified abdominal pain: Secondary | ICD-10-CM

## 2013-11-24 DIAGNOSIS — N200 Calculus of kidney: Secondary | ICD-10-CM

## 2013-11-24 DIAGNOSIS — R059 Cough, unspecified: Secondary | ICD-10-CM

## 2013-11-24 DIAGNOSIS — J329 Chronic sinusitis, unspecified: Secondary | ICD-10-CM

## 2013-11-24 LAB — POCT UA - MICROSCOPIC ONLY
Casts, Ur, LPF, POC: NEGATIVE
Mucus, UA: POSITIVE
RBC, urine, microscopic: NEGATIVE
Yeast, UA: NEGATIVE

## 2013-11-24 LAB — POCT URINALYSIS DIPSTICK
Bilirubin, UA: NEGATIVE
Blood, UA: NEGATIVE
Glucose, UA: NEGATIVE
Ketones, UA: NEGATIVE
Nitrite, UA: NEGATIVE
Protein, UA: NEGATIVE
Spec Grav, UA: >=1.03
Urobilinogen, UA: 0.2
pH, UA: 5.5

## 2013-11-24 LAB — POCT CBC
Granulocyte percent: 56 %G (ref 37–80)
HCT, POC: 44.9 % (ref 37.7–47.9)
Hemoglobin: 14.1 g/dL (ref 12.2–16.2)
Lymph, poc: 2.8 (ref 0.6–3.4)
MCH, POC: 29.6 pg (ref 27–31.2)
MCHC: 31.4 g/dL — AB (ref 31.8–35.4)
MCV: 94.3 fL (ref 80–97)
MID (cbc): 0.8 (ref 0–0.9)
MPV: 8.5 fL (ref 0–99.8)
POC Granulocyte: 4.6 (ref 2–6.9)
POC LYMPH PERCENT: 34.5 %L (ref 10–50)
POC MID %: 9.5 %M (ref 0–12)
Platelet Count, POC: 268 10*3/uL (ref 142–424)
RBC: 4.76 M/uL (ref 4.04–5.48)
RDW, POC: 14 %
WBC: 8.2 10*3/uL (ref 4.6–10.2)

## 2013-11-24 MED ORDER — AMOXICILLIN-POT CLAVULANATE 875-125 MG PO TABS: 1.0000 | ORAL_TABLET | Freq: Two times a day (BID) | ORAL | Status: DC

## 2013-11-24 MED ORDER — METHYLPREDNISOLONE (PAK) 4 MG PO TABS: ORAL_TABLET | ORAL | Status: DC

## 2013-11-24 MED ORDER — IPRATROPIUM BROMIDE 0.03 % NA SOLN: 2.0000 | Freq: Two times a day (BID) | NASAL | Status: DC

## 2013-11-24 NOTE — Progress Notes (Signed)
Subjective:    Patient ID: Jordan Willis, female    DOB: 05/12/1951, 63 y.o.   MRN: [id number]  HPI 63 year old female presents for evaluation of 4 day history of nasal congestion, thick, postnasal drainage and nasal discharge, sinus pain, cough that causes chest pain while coughing, and nausea secondary to PND.  Admits that at onset of illness she had fever and body aches - took tylenol which did help. Has been taking Mucinex as well which is not helping much.  Admits to fatigue, slight SOB, and sinus headache.  Also complains of bilateral ear pressure/fullness.    Also concerned about right flank pain x 1 week. Hx of nephrolithiasis in left kidney evaluated by Dr. Diona Fanti in 2007.  Stable - no treatment at that time due to pt asymptomatic. She was not aware that her stone was on the left.  No injury or pain with ROM. She does not have any back pain today.  Would like to be set up to see Dr. Diona Fanti again for recheck on her renal calculus.    Hx of breast cancer in remission.   Otherwise doing well today with no other concerns.     Review of Systems  Constitutional: Negative for fever and chills.  HENT: Positive for congestion, ear pain (fullness/pressure), sinus pressure, sore throat and voice change (laryngitis). Negative for rhinorrhea.   Respiratory: Positive for cough and shortness of breath. Negative for chest tightness and wheezing.   Cardiovascular: Positive for chest pain (while coughing).  Gastrointestinal: Negative for nausea, vomiting and abdominal pain.  Genitourinary: Positive for flank pain. Negative for dysuria and hematuria.  Neurological: Negative for dizziness and headaches.       Objective:   Physical Exam  Constitutional: She is oriented to person, place, and time. She appears well-developed and well-nourished.  HENT:  Head: Normocephalic and atraumatic.  Right Ear: Hearing, tympanic membrane, external ear and ear canal normal.  Left Ear: Hearing, tympanic  membrane, external ear and ear canal normal.  Nose: Right sinus exhibits maxillary sinus tenderness. Right sinus exhibits no frontal sinus tenderness. Left sinus exhibits maxillary sinus tenderness. Left sinus exhibits no frontal sinus tenderness.  Mouth/Throat: Uvula is midline, oropharynx is clear and moist and mucous membranes are normal.  Eyes: Conjunctivae are normal.  Neck: Normal range of motion. Neck supple.  Cardiovascular: Normal rate, regular rhythm and normal heart sounds.   Pulmonary/Chest: Effort normal and breath sounds normal.  Abdominal: Bowel sounds are normal. There is no tenderness. There is no CVA tenderness.  Lymphadenopathy:    She has no cervical adenopathy.  Neurological: She is alert and oriented to person, place, and time.  Psychiatric: She has a normal mood and affect. Her behavior is normal. Judgment and thought content normal.      [facility] reading (PRIMARY) by  Dr. Elder Cyphers as stool burden in ascending colon. Large left renal calculus.     Assessment & Plan:  Sinusitis  Flank pain - Plan: POCT urinalysis dipstick, POCT UA - Microscopic Only, DG Abd 1 View  Cough - Plan: POCT CBC  Nasal congestion   CBC normal which is reassuring. Could be influenza-like illness but pt has longstanding hx of sinus infections. Will go ahead and treat with Augmentin 875 mg bid x 10 days Has rx for Flonase at home that she will re-start. Also use Atroven NS twice daily Continue Mucinex as directed.   Constipation likely causing her right sided pain. Start miralax daily Left renal calculus  stable. Patient would like to recheck with urology and have it treated. Will place referral.  RTC precautions discussed. Follow up if symptoms worsen or fail to improve.

## 2013-12-31 ENCOUNTER — Ambulatory Visit: Payer: Self-pay | Admitting: Family Medicine

## 2014-01-08 ENCOUNTER — Ambulatory Visit: Payer: 59

## 2014-01-08 ENCOUNTER — Ambulatory Visit: Payer: 59 | Admitting: Family Medicine

## 2014-01-08 VITALS — BP 134/72 | HR 85 | Temp 98.0°F | Resp 18 | Ht 68.5 in | Wt 198.0 lb

## 2014-01-08 DIAGNOSIS — G5600 Carpal tunnel syndrome, unspecified upper limb: Secondary | ICD-10-CM

## 2014-01-08 DIAGNOSIS — M47812 Spondylosis without myelopathy or radiculopathy, cervical region: Secondary | ICD-10-CM

## 2014-01-08 DIAGNOSIS — M25511 Pain in right shoulder: Secondary | ICD-10-CM

## 2014-01-08 DIAGNOSIS — M79601 Pain in right arm: Secondary | ICD-10-CM

## 2014-01-08 DIAGNOSIS — M79609 Pain in unspecified limb: Secondary | ICD-10-CM

## 2014-01-08 DIAGNOSIS — Z853 Personal history of malignant neoplasm of breast: Secondary | ICD-10-CM

## 2014-01-08 DIAGNOSIS — J329 Chronic sinusitis, unspecified: Secondary | ICD-10-CM

## 2014-01-08 DIAGNOSIS — M25519 Pain in unspecified shoulder: Secondary | ICD-10-CM

## 2014-01-08 DIAGNOSIS — I890 Lymphedema, not elsewhere classified: Secondary | ICD-10-CM

## 2014-01-08 DIAGNOSIS — M7500 Adhesive capsulitis of unspecified shoulder: Secondary | ICD-10-CM

## 2014-01-08 MED ORDER — AMOXICILLIN 875 MG PO TABS: 875.0000 mg | ORAL_TABLET | Freq: Two times a day (BID) | ORAL | Status: DC

## 2014-01-08 MED ORDER — PREDNISONE 20 MG PO TABS: ORAL_TABLET | ORAL | Status: DC

## 2014-01-08 NOTE — Progress Notes (Signed)
Subjective: Patient is here for couple of things. She's been having some sinus congestion and blowing some bloody mucus and blood. She has been using some fluticasone nose spray. She also been having problems with pain in her right arm for over a year. She's been wearing wrist splints at night for about a year. She cannot wear them at work because of the limited of motion in her hands. 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 She has a history of bilateral breast cancer, and is followed a regular basis for these. Not on a major cancer medications at this time.  Objective: TMs normal. Sinuses mildly tender. Throat clear. Neck supple without significant nodes. Heart regular without murmurs. Right breast was examined. Has a scar in the upper medial quadrant. No masses. No axillary nodes. Range of motion of the neck is good without a great deal of pain. Range of motion of the right shoulder is limited with incomplete abduction to only about 90. Motor strength is decreased in her right hand. Right hand appears mildly puffy. The pain distribution is more in the median nerve area.  Assessment: Sinusitis Right shoulder pain and decreased range of motion Carpal tunnel syndrome right hand Cervical pain History of bilateral breast cancer  Plan: X-ray neck and shoulder. We'll probably need to get nerve conduction studies on the right hand. Will treat sinusitis.  [facility] reading (PRIMARY) by  Dr. Linna Darner Normal shoulder Arthritic spurring mild in mid segments  .

## 2014-01-08 NOTE — Patient Instructions (Signed)
Take the prednisone 3 pills daily for 2 days, then 2 daily for 2 days, then one daily for 4 days, then one half daily for 4 days  The amoxicillin one twice daily  Referral is being made to physical therapy to work on your shoulder.  Referral is being made for nerve conduction testing of her right arm  Return if not improving. Will consider other specialist if necessary.

## 2014-01-25 ENCOUNTER — Ambulatory Visit (INDEPENDENT_AMBULATORY_CARE_PROVIDER_SITE_OTHER): Payer: 59 | Admitting: Neurology

## 2014-01-25 ENCOUNTER — Ambulatory Visit (INDEPENDENT_AMBULATORY_CARE_PROVIDER_SITE_OTHER): Payer: Self-pay

## 2014-01-25 DIAGNOSIS — G5600 Carpal tunnel syndrome, unspecified upper limb: Secondary | ICD-10-CM

## 2014-01-25 DIAGNOSIS — Z0289 Encounter for other administrative examinations: Secondary | ICD-10-CM

## 2014-01-25 NOTE — Procedures (Signed)
     HISTORY:  Jordan Willis is a 63 year old patient with a history of right neck and shoulder discomfort and numbness down the right arm that has been present for greater than one year. The patient has a sensation of weakness in the right hand, with difficulty opening jars and bottles. The patient is being evaluated for possible neuropathy or a cervical radiculopathy.  NERVE CONDUCTION STUDIES:  Nerve conduction studies were performed on both upper extremities. The distal motor latencies for the median nerves were prolonged on the right, normal on the left, with normal motor amplitudes for these nerves bilaterally. The distal motor latencies and motor amplitudes for the ulnar nerves were normal bilaterally. The F wave latencies for the median nerves were prolonged on the right, normal on the left, and normal for the ulnar nerves bilaterally. The nerve conduction velocities for the median and ulnar nerves were normal bilaterally. The sensory latencies for the median nerves were prolonged on the right, normal on the left, and normal for the ulnar nerves bilaterally.  EMG STUDIES:  EMG study was performed on the right upper extremity:  The first dorsal interosseous muscle reveals 2 to 4 K units with full recruitment. No fibrillations or positive waves were noted. The abductor pollicis brevis muscle reveals 2 to 5 K units with decreased recruitment. No fibrillations or positive waves were noted. The extensor indicis proprius muscle reveals 1 to 3 K units with full recruitment. No fibrillations or positive waves were noted. The pronator teres muscle reveals 2 to 3 K units with full recruitment. No fibrillations or positive waves were noted. The biceps muscle reveals 1 to 2 K units with full recruitment. No fibrillations or positive waves were noted. The triceps muscle reveals 2 to 4 K units with full recruitment. No fibrillations or positive waves were noted. The anterior deltoid muscle reveals 2 to 3  K units with full recruitment. No fibrillations or positive waves were noted. The cervical paraspinal muscles were tested at 2 levels. No abnormalities of insertional activity were seen at either level tested. There was good relaxation.   IMPRESSION:  Nerve conduction studies done on both upper extremities reveals evidence of a mild right carpal tunnel syndrome. EMG evaluation of the right upper extremity also shows findings consistent with a mild right carpal tunnel syndrome, without evidence of an overlying cervical radiculopathy.  Jill Alexanders MD 01/25/2014 10:40 AM  [facility] [address] [place], [place] [address]  Phone [phone] Fax [phone]

## 2014-05-06 ENCOUNTER — Other Ambulatory Visit: Payer: Self-pay | Admitting: Physician Assistant

## 2014-07-06 NOTE — Telephone Encounter (Signed)
Opened in error

## 2014-07-17 ENCOUNTER — Telehealth: Payer: Self-pay | Admitting: Hematology and Oncology

## 2014-07-17 NOTE — Telephone Encounter (Signed)
Pt's vm was full, mailed out sch w/labs/ov and MD change to pt...Marland KitchenMarland KitchenMarland Kitchen KJ

## 2014-10-03 ENCOUNTER — Telehealth: Payer: Self-pay | Admitting: Hematology and Oncology

## 2014-10-03 NOTE — Telephone Encounter (Signed)
could not lv vm....mailed pt appt sched for DEC and Jan ..Marland Kitchen

## 2014-10-04 ENCOUNTER — Other Ambulatory Visit: Payer: 59

## 2014-10-07 ENCOUNTER — Ambulatory Visit: Payer: 59 | Admitting: Hematology and Oncology

## 2014-10-13 ENCOUNTER — Telehealth: Payer: Self-pay

## 2014-10-13 NOTE — Telephone Encounter (Signed)
Spoke to pt advised that she needed to RTC for abx

## 2014-10-13 NOTE — Telephone Encounter (Signed)
Patient called requesting medication for her sinus infection. Advised patient that she needs to come to office and be evaluated before we send something to her pharmacy. Patient states she may try to come in today or tomorrow. Please advise.  [phone]

## 2014-10-27 ENCOUNTER — Ambulatory Visit
Admission: RE | Admit: 2014-10-27 | Discharge: 2014-10-27 | Disposition: A | Discharge status: Home / Self Care | Payer: No Typology Code available for payment source | Source: Ambulatory Visit | Attending: Oncology | Admitting: Oncology

## 2014-10-27 DIAGNOSIS — D0511 Intraductal carcinoma in situ of right breast: Secondary | ICD-10-CM

## 2014-10-27 DIAGNOSIS — C50912 Malignant neoplasm of unspecified site of left female breast: Secondary | ICD-10-CM

## 2014-11-16 ENCOUNTER — Ambulatory Visit: Payer: 59 | Admitting: Hematology and Oncology

## 2014-11-16 NOTE — Assessment & Plan Note (Signed)
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 Annual mammograms (10/27/14 is normal) and breast MRIs 2. Physical examination of the breast 11/16/2014 is normal Return to clinic once a year for surveillance

## 2015-07-12 ENCOUNTER — Encounter: Payer: Self-pay | Admitting: Hematology and Oncology

## 2015-07-12 ENCOUNTER — Telehealth: Payer: Self-pay | Admitting: Hematology and Oncology

## 2015-07-12 ENCOUNTER — Ambulatory Visit (HOSPITAL_BASED_OUTPATIENT_CLINIC_OR_DEPARTMENT_OTHER): Payer: No Typology Code available for payment source | Admitting: Hematology and Oncology

## 2015-07-12 VITALS — BP 125/61 | HR 74 | Temp 98.2°F | Resp 18 | Ht 68.5 in | Wt 191.3 lb

## 2015-07-12 DIAGNOSIS — D0511 Intraductal carcinoma in situ of right breast: Secondary | ICD-10-CM

## 2015-07-12 DIAGNOSIS — C50912 Malignant neoplasm of unspecified site of left female breast: Secondary | ICD-10-CM | POA: Diagnosis not present

## 2015-07-12 NOTE — Telephone Encounter (Signed)
Appointments made and avs printed for patient °

## 2015-07-12 NOTE — Assessment & Plan Note (Signed)
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 Breast exam 07/12/2015: Normal 2. Mammogram 10/27/2014 No abnormalities. Postsurgical changes. Breast Density Category B. I recommended that she get 3-D mammograms for surveillance. Discussed the differences between different breast density categories.  BRCA2 mutation: I recommended annual breast MRIs in addition to mammograms. Ovarian cancer risk reduction  Return to clinic in 1 year for follow-up

## 2015-07-12 NOTE — Progress Notes (Signed)
Patient Care Team: Robyn Haber, MD as PCP - General (Family Medicine) Jordan Bern, MD as Attending Physician (Obstetrics and Gynecology) Posey Boyer, MD as Consulting Physician (Family Medicine)  DIAGNOSIS: No matching staging information was found for the patient.  SUMMARY OF ONCOLOGIC HISTORY:   Breast cancer, female, left   02/02/2005 Initial Diagnosis Left breast needle core biopsy showed invasive mammary cancer   02/11/2005 Breast MRI Left breast upper outer quadrant: 4.4 x 2.6 x 3.7 cm mass and a smaller adjacent mass measuring 1 x 0.8 x 0.4 cm   03/02/2005 - 07/20/2005 Neo-Adjuvant Chemotherapy Before meals 4 followed by Taxotere 4   08/03/2005 Surgery Left breast lumpectomy: 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%, PR 0%, HER-2 negative, Ki-67 31%   09/26/2005 - 11/13/2005 Radiation Therapy Adjuvant radiation therapy   03/18/2008 Initial Biopsy 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   03/22/2008 Surgery Right breast lumpectomy 0.9 cm DCIS ER 90% PR 98% stage 0   04/11/2008 Procedure 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   05/10/2008 - 07/02/2008 Radiation Therapy Adjuvant radiation therapy   07/17/2008 - 04/16/2009 Anti-estrogen oral therapy Femara then switched to tamoxifen which was discontinued in 2010 due to concern about uterine cancer    CHIEF COMPLIANT: Follow-up of bilateral breast cancers  INTERVAL HISTORY: Jordan Willis is a 64 year old African-American female with above-mentioned history of bilateral breast cancers with the BRCA2 mutation who is here today to discuss surveillance. She reports no problems with the breasts. She is unable to afford breast MRIs because of her $2000 deductible. She is also having difficulty paying for the mammograms.  REVIEW OF SYSTEMS:   Constitutional: Denies fevers, chills or abnormal weight loss Eyes: Denies blurriness of  vision Ears, nose, mouth, throat, and face: Denies mucositis or sore throat Respiratory: Denies cough, dyspnea or wheezes Cardiovascular: Denies palpitation, chest discomfort or lower extremity swelling Gastrointestinal:  Denies nausea, heartburn or change in bowel habits Skin: Denies abnormal skin rashes Lymphatics: Denies new lymphadenopathy or easy bruising Neurological:Denies numbness, tingling or new weaknesses Behavioral/Psych: Mood is stable, no new changes  Breast:  denies any pain or lumps or nodules in either breasts All other systems were reviewed with the patient and are negative.  I have reviewed the past medical history, past surgical history, social history and family history with the patient and they are unchanged from previous note.  ALLERGIES:  is allergic to asa.  MEDICATIONS:  Current Outpatient Prescriptions  Medication Sig Dispense Refill  . fluticasone (FLONASE) 50 MCG/ACT nasal spray Place 2 sprays into the nose daily. 16 g 6  . ipratropium (ATROVENT) 0.03 % nasal spray Place 2 sprays into the nose 2 (two) times daily. 30 mL 0  . Multiple Vitamin (MULITIVITAMIN WITH MINERALS) TABS Take 1 tablet by mouth daily.    . Omega-3 Fatty Acids (FISH OIL) 1000 MG CAPS Take 1,000 mg by mouth daily.     No current facility-administered medications for this visit.    PHYSICAL EXAMINATION: ECOG PERFORMANCE STATUS: 0 - Asymptomatic  Filed Vitals:   07/12/15 0830  BP: 125/61  Pulse: 74  Temp: 98.2 F (36.8 C)  Resp: 18   Filed Weights   07/12/15 0830  Weight: 191 lb 4.8 oz (86.773 kg)    GENERAL:alert, no distress and comfortable SKIN: skin color, texture, turgor are normal, no rashes or significant lesions EYES: normal, Conjunctiva are pink and non-injected,  sclera clear OROPHARYNX:no exudate, no erythema and lips, buccal mucosa, and tongue normal  NECK: supple, thyroid normal size, non-tender, without nodularity LYMPH:  no palpable lymphadenopathy in the  cervical, axillary or inguinal LUNGS: clear to auscultation and percussion with normal breathing effort HEART: regular rate & rhythm and no murmurs and no lower extremity edema ABDOMEN:abdomen soft, non-tender and normal bowel sounds Musculoskeletal:no cyanosis of digits and no clubbing  NEURO: alert & oriented x 3 with fluent speech, no focal motor/sensory deficits BREAST: No palpable masses or nodules in either right or left breasts. No palpable axillary supraclavicular or infraclavicular adenopathy no breast tenderness or nipple discharge. (exam performed in the presence of a chaperone)  LABORATORY DATA:  I have reviewed the data as listed   Chemistry      Component Value Date/Time   NA 139 10/06/2013 0900   NA 137 07/02/2012 1025   K 3.6 10/06/2013 0900   K 3.8 07/02/2012 1025   CL 104 02/20/2013 1109   CL 103 07/02/2012 1025   CO2 24 10/06/2013 0900   CO2 23 07/02/2012 1025   BUN 10.4 10/06/2013 0900   BUN 15 07/02/2012 1025   CREATININE 0.7 10/06/2013 0900   CREATININE 0.94 07/02/2012 1025   CREATININE 0.64 03/03/2012 1151      Component Value Date/Time   CALCIUM 10.3 10/06/2013 0900   CALCIUM 10.0 07/02/2012 1025   ALKPHOS 133 10/06/2013 0900   ALKPHOS 91 07/02/2012 1025   AST 22 10/06/2013 0900   AST 17 07/02/2012 1025   ALT 25 10/06/2013 0900   ALT 17 07/02/2012 1025   BILITOT 0.24 10/06/2013 0900   BILITOT 0.4 07/02/2012 1025       Lab Results  Component Value Date   WBC 8.2 11/24/2013   HGB 14.1 11/24/2013   HCT 44.9 11/24/2013   MCV 94.3 11/24/2013   PLT 295 10/06/2013   NEUTROABS 2.8 10/06/2013   ASSESSMENT & PLAN:  Breast cancer, female, left 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 Breast exam 07/12/2015: Normal 2. Mammogram 10/27/2014 No abnormalities. Postsurgical changes. Breast Density Category B. I recommended that she get 3-D mammograms for surveillance. Discussed the differences between different breast density categories.  BRCA2 mutation: I recommended annual breast MRIs in addition to mammograms. Ovarian cancer risk reduction  Return to clinic in 1 year for follow-up   DCIS (ductal carcinoma in situ) of breast, right The right breast DCIS diagnosed 02/27/2008 0.9 cm, ER 90%, PR 98% status post lumpectomy followed by radiation completed 07/02/2008 took antiestrogen therapy with Femara initially and then went on tamoxifen discontinued in 2010 due to concern about uterine cancer.  Surveillance: No palpable abnormalities to breast exam and mammograms done January 2016 were normal. Based on the BRCA2 mutation, she will benefit from annual breast MRIs as well. But because her insurance charges $2000 deductible she is unable to afford the breast MRIs.  Survivorship: Discussed the importance of physical exercise in decreasing the likelihood of breast cancer recurrence. Recommended 30 mins daily 6 days a week of either brisk walking or cycling or swimming. Encouraged patient to eat more fruits and vegetables and decrease red meat.  Patient takes immunocal which is a nutritional supplement that manufactures more glutathione in the body and  has antioxidant benefits. Patient stated that if I give a prescription it would be cheaper for her. After reviewing the PDR I did give her a prescription today.  No orders of the defined types were placed in this encounter.   The patient has a good understanding of the overall plan. she agrees with it. she will call with any problems that may develop before the next visit here.   Rulon Eisenmenger, MD

## 2015-07-12 NOTE — Assessment & Plan Note (Signed)
The right breast DCIS diagnosed 02/27/2008 0.9 cm, ER 90%, PR 98% status post lumpectomy followed by radiation completed 07/02/2008 took antiestrogen therapy with Femara initially and then went on tamoxifen discontinued in 2010 due to concern about uterine cancer.  Surveillance: No palpable abnormalities to breast exam and mammograms done January 2016 were normal. Based on the BRCA2 mutation, she will benefit from annual breast MRIs as well.  Survivorship: Discussed the importance of physical exercise in decreasing the likelihood of breast cancer recurrence. Recommended 30 mins daily 6 days a week of either brisk walking or cycling or swimming. Encouraged patient to eat more fruits and vegetables and decrease red meat.

## 2015-12-13 ENCOUNTER — Other Ambulatory Visit: Payer: Self-pay

## 2015-12-13 ENCOUNTER — Other Ambulatory Visit: Payer: Self-pay | Admitting: Hematology and Oncology

## 2015-12-13 DIAGNOSIS — Z853 Personal history of malignant neoplasm of breast: Secondary | ICD-10-CM

## 2015-12-20 ENCOUNTER — Other Ambulatory Visit: Payer: Self-pay | Admitting: Hematology and Oncology

## 2015-12-20 ENCOUNTER — Ambulatory Visit
Admission: RE | Admit: 2015-12-20 | Discharge: 2015-12-20 | Disposition: A | Discharge status: Home / Self Care | Payer: BLUE CROSS/BLUE SHIELD | Source: Ambulatory Visit | Attending: Hematology and Oncology | Admitting: Hematology and Oncology

## 2015-12-20 DIAGNOSIS — Z853 Personal history of malignant neoplasm of breast: Secondary | ICD-10-CM

## 2016-01-05 ENCOUNTER — Ambulatory Visit (INDEPENDENT_AMBULATORY_CARE_PROVIDER_SITE_OTHER): Payer: BLUE CROSS/BLUE SHIELD

## 2016-01-05 ENCOUNTER — Ambulatory Visit (INDEPENDENT_AMBULATORY_CARE_PROVIDER_SITE_OTHER): Payer: BLUE CROSS/BLUE SHIELD | Admitting: Family Medicine

## 2016-01-05 ENCOUNTER — Ambulatory Visit: Payer: BLUE CROSS/BLUE SHIELD

## 2016-01-05 VITALS — BP 138/66 | HR 65 | Temp 98.5°F | Resp 18 | Ht 68.0 in | Wt 192.4 lb

## 2016-01-05 DIAGNOSIS — R110 Nausea: Secondary | ICD-10-CM | POA: Diagnosis not present

## 2016-01-05 DIAGNOSIS — R059 Cough, unspecified: Secondary | ICD-10-CM

## 2016-01-05 DIAGNOSIS — R05 Cough: Secondary | ICD-10-CM | POA: Diagnosis not present

## 2016-01-05 DIAGNOSIS — R6889 Other general symptoms and signs: Secondary | ICD-10-CM | POA: Diagnosis not present

## 2016-01-05 LAB — POCT INFLUENZA A/B
INFLUENZA A, POC: NEGATIVE
INFLUENZA B, POC: NEGATIVE

## 2016-01-05 MED ORDER — HYDROCODONE-HOMATROPINE 5-1.5 MG/5ML PO SYRP: 5.0000 mL | ORAL_SOLUTION | ORAL | Status: DC | PRN

## 2016-01-05 MED ORDER — ONDANSETRON 4 MG PO TBDP
4.0000 mg | ORAL_TABLET | Freq: Once | ORAL | Status: AC
  Administered 2016-01-05: 4 mg via ORAL

## 2016-01-05 MED ORDER — ONDANSETRON 4 MG PO TBDP: 4.0000 mg | ORAL_TABLET | Freq: Three times a day (TID) | ORAL | Status: DC | PRN

## 2016-01-05 NOTE — Patient Instructions (Signed)
Your flu test is negative. There is still a chance that this is a flu bug, but with having had the flu shot and the negative test I do not think you should be treated with the flu medication. There are also a number of non-influenza viruses that act similarly but tender not be quite as bad.  Hycodan 1 teaspoon every 4-6 hours as needed for cough  You can take an over-the-counter antihistamine decongestant such as Claritin-D or Allegra-D or Zyrtec-D if needed for head congestion  Take Tylenol 500 mg 2 pills 3 times daily (acetaminophen) or ibuprofen 200 mg 3 tablets 3 times daily if needed for fever or aching  Drink plenty of fluids and get enough rest  Return if worse or not improving  Take Zofran (ondansetron) 4 mg 1 every 6-8 hours as needed for nausea or vomiting

## 2016-01-05 NOTE — Progress Notes (Signed)
Patient ID: Jordan Willis, female    DOB: Apr 21, 1951  Age: 65 y.o. MRN: [id number]  Chief Complaint  Patient presents with  . Headache    couple of days  . Nasal Congestion  . Cough  . Generalized Body Aches  . Nausea    Subjective:   65 year old lady known to me. For the last 2 days she has been having some flulike symptoms with headache nasal congestion cough body aches nausea without vomiting and one episode of diarrhea. 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 She did have a flu shot this year.  Current allergies, medications, problem list, past/family and social histories reviewed.  Objective:  BP 138/66 mmHg  Pulse 65  Temp(Src) 98.5 F (36.9 C)  Resp 18  Ht 5\' 8"  (1.727 m)  Wt 192 lb 6.4 oz (87.272 kg)  BMI 29.26 kg/m2  SpO2 98%  Ill-appearing lady in no major distress. TMs normal. Throat not erythematous. Neck supple without significant nodes. Chest clear to auscultation. Heart regular without murmur. Abdomen soft.  Assessment & Plan:   Assessment: 1. Flu-like symptoms   2. Nausea without vomiting   3. Cough       Plan:  Flu test then proceed from there Gave him Zofran 4 mg in the office because she was nauseated. Orders Placed This Encounter  Procedures  . POCT Influenza A/B    Meds ordered this encounter  Medications  . ondansetron (ZOFRAN-ODT) disintegrating tablet 4 mg    Sig:   . HYDROcodone-homatropine (HYCODAN) 5-1.5 MG/5ML syrup    Sig: Take 5 mLs by mouth every 4 (four) hours as needed.    Dispense:  120 mL    Refill:  0  . ondansetron (ZOFRAN ODT) 4 MG disintegrating tablet    Sig: Take 1 tablet (4 mg total) by mouth every 8 (eight) hours as needed for nausea or vomiting.    Dispense:  20 tablet    Refill:  0         Patient Instructions  Your flu test is negative. There is still a chance that this is a flu bug, but with having had the flu shot and the negative test I do not  think you should be treated with the flu medication. There are also a number of non-influenza viruses that act similarly but tender not be quite as bad.  Hycodan 1 teaspoon every 4-6 hours as needed for cough  You can take an over-the-counter antihistamine decongestant such as Claritin-D or Allegra-D or Zyrtec-D if needed for head congestion  Take Tylenol 500 mg 2 pills 3 times daily (acetaminophen) or ibuprofen 200 mg 3 tablets 3 times daily if needed for fever or aching  Drink plenty of fluids and get enough rest  Return if worse or not improving  Take Zofran (ondansetron) 4 mg 1 every 6-8 hours as needed for nausea or vomiting         No Follow-up on file.   Dierks Wach, MD 01/05/2016

## 2016-07-12 ENCOUNTER — Ambulatory Visit: Payer: No Typology Code available for payment source | Admitting: Hematology and Oncology

## 2016-07-12 NOTE — Assessment & Plan Note (Deleted)
Right breast DCIS diagnosed 02/27/2008 0.9 cm, ER 90%, PR 98% status post lumpectomy followed by radiation completed 07/02/2008 took antiestrogen therapy with Femara initially and then went on tamoxifen discontinued in 2010 due to concern about uterine cancer.  Surveillance: Mammograms done in February 2017, cannot afford MRIs because of $2000 deductible Supplement: Patient takes immunocal which is a nutritional supplement that manufactures more glutathione in the body and has antioxidant benefits  Return to clinic in 1 year

## 2016-07-12 NOTE — Assessment & Plan Note (Deleted)
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 Breast exam 07/12/2016: Normal 2. Mammogram 12/20/2015 No abnormalities. Postsurgical changes. Breast Density Category B. I recommended that she get 3-D mammograms for surveillance.   BRCA2 mutation: I recommended annual breast MRIs in addition to mammograms. However patient has not had any MRIs done by percent preference.  Ovarian cancer risk reduction: GYN  Return to clinic in 1 year for follow-up

## 2016-07-18 ENCOUNTER — Ambulatory Visit (HOSPITAL_BASED_OUTPATIENT_CLINIC_OR_DEPARTMENT_OTHER): Payer: BLUE CROSS/BLUE SHIELD | Admitting: Hematology and Oncology

## 2016-07-18 ENCOUNTER — Encounter: Payer: Self-pay | Admitting: Hematology and Oncology

## 2016-07-18 DIAGNOSIS — C50412 Malignant neoplasm of upper-outer quadrant of left female breast: Secondary | ICD-10-CM

## 2016-07-18 DIAGNOSIS — D0511 Intraductal carcinoma in situ of right breast: Secondary | ICD-10-CM

## 2016-07-18 DIAGNOSIS — Z853 Personal history of malignant neoplasm of breast: Secondary | ICD-10-CM

## 2016-07-18 NOTE — Assessment & Plan Note (Signed)
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 Breast exam 07/12/2015: Normal 2. Mammogram 10/27/2014 No abnormalities. Postsurgical changes. Breast Density Category B. I recommended that she get 3-D mammograms for surveillance. Discussed the differences between different breast density categories.  BRCA2 mutation: I recommended annual breast MRIs in addition to mammograms. Ovarian cancer risk reduction  Return to clinic in 1 year for follow-up

## 2016-07-18 NOTE — Progress Notes (Signed)
Patient Care Team: Robyn Haber, MD as PCP - General (Family Medicine) Delsa Bern, MD as Attending Physician (Obstetrics and Gynecology) Posey Boyer, MD as Consulting Physician (Family Medicine)  SUMMARY OF ONCOLOGIC HISTORY:   Breast cancer of upper-outer quadrant of left female breast ([place])   02/02/2005 Initial Diagnosis    Left breast needle core biopsy showed invasive mammary cancer      02/11/2005 Breast MRI    Left breast upper outer quadrant: 4.4 x 2.6 x 3.7 cm mass and a smaller adjacent mass measuring 1 x 0.8 x 0.4 cm      03/02/2005 - 07/20/2005 Neo-Adjuvant Chemotherapy    Before meals 4 followed by Taxotere 4      08/03/2005 Surgery    Left breast lumpectomy: 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%, PR 0%, HER-2 negative, Ki-67 31%      09/26/2005 - 11/13/2005 Radiation Therapy    Adjuvant radiation therapy      03/18/2008 Initial Biopsy    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      03/22/2008 Surgery    Right breast lumpectomy 0.9 cm DCIS ER 90% PR 98% stage 0      04/11/2008 Procedure    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      05/10/2008 - 07/02/2008 Radiation Therapy    Adjuvant radiation therapy      07/17/2008 - 04/16/2009 Anti-estrogen oral therapy    Femara then switched to tamoxifen which was discontinued in 2010 due to concern about uterine cancer       CHIEF COMPLIANT: Surveillance of breast cancer with BRCA2 mutation  INTERVAL HISTORY: Jordan Willis is a 65 year old with above-mentioned history of BRCA2 mutation who had bilateral breast cancers left 7 2006 and right side in 2009. She is currently on surveillance. She is doing quite well with eating healthy and exercising. 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 After Taxotere her hair did not completely come back. She continues to notice lack central hair on her head. She has  approached and sought legal help in this regard.  REVIEW OF SYSTEMS:   Constitutional: Denies fevers, chills or abnormal weight loss Eyes: Denies blurriness of vision Ears, nose, mouth, throat, and face: Denies mucositis or sore throat Respiratory: Denies cough, dyspnea or wheezes Cardiovascular: Denies palpitation, chest discomfort Gastrointestinal:  Denies nausea, heartburn or change in bowel habits Skin: Denies abnormal skin rashes Lymphatics: Denies new lymphadenopathy or easy bruising Neurological:Denies numbness, tingling or new weaknesses Behavioral/Psych: Mood is stable, no new changes  Extremities: No lower extremity edema Breast:  denies any pain or lumps or nodules in either breasts All other systems were reviewed with the patient and are negative.  I have reviewed the past medical history, past surgical history, social history and family history with the patient and they are unchanged from previous note.  ALLERGIES:  is allergic to asa [aspirin].  MEDICATIONS:  Current Outpatient Prescriptions  Medication Sig Dispense Refill  . HYDROcodone-homatropine (HYCODAN) 5-1.5 MG/5ML syrup Take 5 mLs by mouth every 4 (four) hours as needed. 120 mL 0  . ipratropium (ATROVENT) 0.03 % nasal spray Place 2 sprays into the nose 2 (two) times daily. 30 mL 0  . ondansetron (ZOFRAN ODT) 4 MG disintegrating tablet Take 1 tablet (4 mg total) by mouth every 8 (eight) hours as needed for nausea or vomiting. 20 tablet 0   No current  facility-administered medications for this visit.     PHYSICAL EXAMINATION: ECOG PERFORMANCE STATUS: 0 - Asymptomatic  Vitals:   07/18/16 0953  BP: (!) 119/54  Pulse: 60  Resp: 18  Temp: 98.5 F (36.9 C)   Filed Weights   07/18/16 0953  Weight: 148 lb 7 oz (67.3 kg)    GENERAL:alert, no distress and comfortable SKIN: skin color, texture, turgor are normal, no rashes or significant lesions EYES: normal, Conjunctiva are pink and non-injected, sclera  clear OROPHARYNX:no exudate, no erythema and lips, buccal mucosa, and tongue normal  NECK: supple, thyroid normal size, non-tender, without nodularity LYMPH:  no palpable lymphadenopathy in the cervical, axillary or inguinal LUNGS: clear to auscultation and percussion with normal breathing effort HEART: regular rate & rhythm and no murmurs and no lower extremity edema ABDOMEN:abdomen soft, non-tender and normal bowel sounds MUSCULOSKELETAL:no cyanosis of digits and no clubbing  NEURO: alert & oriented x 3 with fluent speech, no focal motor/sensory deficits EXTREMITIES: No lower extremity edema BREAST: No palpable masses or nodules in either right or left breasts. No palpable axillary supraclavicular or infraclavicular adenopathy no breast tenderness or nipple discharge. (exam performed in the presence of a chaperone)  LABORATORY DATA:  I have reviewed the data as listed   Chemistry      Component Value Date/Time   NA 139 10/06/2013 0900   K 3.6 10/06/2013 0900   CL 104 02/20/2013 1109   CO2 24 10/06/2013 0900   BUN 10.4 10/06/2013 0900   CREATININE 0.7 10/06/2013 0900      Component Value Date/Time   CALCIUM 10.3 10/06/2013 0900   ALKPHOS 133 10/06/2013 0900   AST 22 10/06/2013 0900   ALT 25 10/06/2013 0900   BILITOT 0.24 10/06/2013 0900       Lab Results  Component Value Date   WBC 8.2 11/24/2013   HGB 14.1 11/24/2013   HCT 44.9 11/24/2013   MCV 94.3 11/24/2013   PLT 295 10/06/2013   NEUTROABS 2.8 10/06/2013     ASSESSMENT & PLAN:  Breast cancer of upper-outer quadrant of left female breast ([place]) 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 Breast exam 07/12/2015: Normal 2.  Mammogram 10/27/2014 No abnormalities. Postsurgical changes. Breast Density Category B. I recommended that she get 3-D mammograms for surveillance. Discussed the differences between different breast density categories.  BRCA2 mutation: I recommended annual breast MRIs in addition to mammograms. Previously this was not done because of cost concerns. She will be getting Medicare/Medicaid starting December 2017. So she will be able to get her MRI done in August 2018. Mammograms are going to be done in February 2018. Ovarian cancer risk reduction: Patient needs to consider oophorectomy or GYN surveillance.   DCIS (ductal carcinoma in situ) of breast, right right breast DCIS diagnosed 02/27/2008 0.9 cm, ER 90%, PR 98% status post lumpectomy followed by radiation completed 07/02/2008 took antiestrogen therapy with Femara initially and then went on tamoxifen discontinued in 2010 due to concern about uterine cancer.  Surveillance: Since the patient will be on Medicare, she would be eligible to get the breast MRIs. I recommended that she get mammograms in February and MRIs August of every year.  Survivorship: Discussed the importance of physical exercise in decreasing the likelihood of  breast cancer recurrence. Recommended 30 mins daily 6 days a week of either brisk walking or cycling or swimming. Encouraged patient to eat more fruits and vegetables and decrease red meat.   Patient takes immunocal which is a nutritional supplement that manufactures more glutathione in the body and has antioxidant benefits.   Return to clinic in 1 year for follow-up  Orders Placed This Encounter  Procedures  . MM DIAG BREAST TOMO BILATERAL    EPIC ORDER PF: 12/20/2015 BCG NO NEEDS   CR/PT  BCBS  HX BR CA    Standing Status:   Future    Standing Expiration Date:   09/17/2017    Order Specific Question:   Reason for Exam (SYMPTOM  OR DIAGNOSIS REQUIRED)    Answer:   Bilateral breast cancer with BRCA 2    Order  Specific Question:   Preferred imaging location?    Answer:   [facility]  . MR Breast Bilateral W Wo Contrast    Standing Status:   Future    Standing Expiration Date:   09/17/2017    Scheduling Instructions:     Patient will get Medicare and Medicaid cards in Dec 2017.    Order Specific Question:   If indicated for the ordered procedure, I authorize the administration of contrast media per Radiology protocol    Answer:   Yes    Order Specific Question:   Reason for Exam (SYMPTOM  OR DIAGNOSIS REQUIRED)    Answer:   Bilateral breast cancer with BRCA 2    Order Specific Question:   Preferred imaging location?    Answer:   [id number]    Order Specific Question:   Does the patient have a pacemaker or implanted devices?    Answer:   No    Order Specific Question:   What is the patient's sedation requirement?    Answer:   No Sedation   The patient has a good understanding of the overall plan. she agrees with it. she will call with any problems that may develop before the next visit here.   Rulon Eisenmenger, MD 07/18/16

## 2016-07-18 NOTE — Assessment & Plan Note (Addendum)
right breast DCIS diagnosed 02/27/2008 0.9 cm, ER 90%, PR 98% status post lumpectomy followed by radiation completed 07/02/2008 took antiestrogen therapy with Femara initially and then went on tamoxifen discontinued in 2010 due to concern about uterine cancer.  Surveillance: Since the patient will be on Medicare, she would be eligible to get the breast MRIs. I recommended that she get mammograms in February and MRIs August of every year.  Survivorship: Discussed the importance of physical exercise in decreasing the likelihood of breast cancer recurrence. Recommended 30 mins daily 6 days a week of either brisk walking or cycling or swimming. Encouraged patient to eat more fruits and vegetables and decrease red meat.   Patient takes immunocal which is a nutritional supplement that manufactures more glutathione in the body and has antioxidant benefits.

## 2016-09-06 ENCOUNTER — Encounter (HOSPITAL_COMMUNITY): Payer: Self-pay | Admitting: Emergency Medicine

## 2016-09-06 ENCOUNTER — Emergency Department (HOSPITAL_COMMUNITY)
Admission: EM | Admit: 2016-09-06 | Discharge: 2016-09-06 | Disposition: A | Discharge status: Home / Self Care | Payer: BLUE CROSS/BLUE SHIELD | Attending: Emergency Medicine | Admitting: Emergency Medicine

## 2016-09-06 ENCOUNTER — Emergency Department (HOSPITAL_COMMUNITY): Payer: BLUE CROSS/BLUE SHIELD

## 2016-09-06 DIAGNOSIS — S060X0A Concussion without loss of consciousness, initial encounter: Secondary | ICD-10-CM | POA: Diagnosis not present

## 2016-09-06 DIAGNOSIS — S0083XA Contusion of other part of head, initial encounter: Secondary | ICD-10-CM | POA: Insufficient documentation

## 2016-09-06 DIAGNOSIS — W228XXA Striking against or struck by other objects, initial encounter: Secondary | ICD-10-CM | POA: Diagnosis not present

## 2016-09-06 DIAGNOSIS — S0990XA Unspecified injury of head, initial encounter: Secondary | ICD-10-CM | POA: Diagnosis present

## 2016-09-06 DIAGNOSIS — Z853 Personal history of malignant neoplasm of breast: Secondary | ICD-10-CM | POA: Diagnosis not present

## 2016-09-06 DIAGNOSIS — Y929 Unspecified place or not applicable: Secondary | ICD-10-CM | POA: Diagnosis not present

## 2016-09-06 DIAGNOSIS — Y939 Activity, unspecified: Secondary | ICD-10-CM | POA: Insufficient documentation

## 2016-09-06 DIAGNOSIS — Y999 Unspecified external cause status: Secondary | ICD-10-CM | POA: Insufficient documentation

## 2016-09-06 MED ORDER — ONDANSETRON 4 MG PO TBDP: 4.0000 mg | ORAL_TABLET | Freq: Three times a day (TID) | ORAL | 0 refills | Status: DC | PRN

## 2016-09-06 MED ORDER — ONDANSETRON 4 MG PO TBDP
4.0000 mg | ORAL_TABLET | Freq: Once | ORAL | Status: AC
  Administered 2016-09-06: 4 mg via ORAL
  Filled 2016-09-06: qty 1

## 2016-09-06 MED ORDER — ACETAMINOPHEN 325 MG PO TABS
650.0000 mg | ORAL_TABLET | Freq: Once | ORAL | Status: AC
  Administered 2016-09-06: 650 mg via ORAL
  Filled 2016-09-06: qty 2

## 2016-09-06 NOTE — ED Triage Notes (Signed)
Per pt/family member-was checking oil in car when hood fell on her head, striking her on forehead-no blood thinners, possible LOC-states every]thing went black-states having headache,blurred vision and dizziness-UC told them to come to ED for scan

## 2016-09-06 NOTE — ED Provider Notes (Signed)
[facility] DEPT Provider Note   CSN: [id number] Arrival date & time: 09/06/16  [id number]     History   Chief Complaint Chief Complaint  Patient presents with  . Head Injury    HPI ELLICE Willis is a 65 y.o. female.  HPI  65 year old female presents with a head injury. At around 6 AM this morning she was changing the oil in her car and the hood came back down and hit her in the right for head. Swelling has decreased since putting ice on it. Headache is slightly better but still 7 out of 10. Did not lose consciousness. Is not on any blood thinners. Has not taken anything for the pain. Has felt dizziness and blurred vision, both of which have improved. Is nauseated, no vomiting. Photophobic. No weakness or numbness. No neck pain or stiffness.  Past Medical History:  Diagnosis Date  . Arthritis    Lumbar spine DDD.  Marland Kitchen Breast cancer ([place])    LEFT  . Breast cancer, female, left 03/03/2012  . 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 Hyperlipidemia   . Menopause 03/03/2012  . Nephrolithiasis   . Sinusitis 2014    Patient Active Problem List   Diagnosis Date Noted  . Breast cancer of upper-outer quadrant of left female breast ([place]) 03/03/2012  . 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 Menopause 03/03/2012    Past Surgical History:  Procedure Laterality Date  . BILATERAL SALPINGOOPHORECTOMY Bilateral   . BREAST SURGERY Left 2006   invasive mammary cancer/high grade ductal carcinoma; chemotherapy and radiation  . BREAST SURGERY Right 2009   followed by radiation  . COLONOSCOPY  10/22/2009   Normal.  Repeat 5 years.  Nat [name]  . TUBAL LIGATION      OB History    No data available       Home Medications    Prior to Admission medications   Medication Sig Start Date End Date Taking? Authorizing Provider  cholecalciferol (VITAMIN D) 1000 units tablet Take 1,000 Units by mouth daily.   Yes Historical Provider, MD    fexofenadine-pseudoephedrine (ALLEGRA-D) 60-120 MG 12 hr tablet Take 1 tablet by mouth 2 (two) times daily as needed (for sinus's).   Yes Historical Provider, MD  Garlic [id number] MG CAPS Take 1 capsule by mouth daily.   Yes Historical Provider, MD  ketotifen (ALLERGY EYE DROPS) 0.025 % ophthalmic solution Place 1 drop into both eyes daily as needed (for allergies).   Yes Historical Provider, MD  Multiple Vitamin (MULTIVITAMIN WITH MINERALS) TABS tablet Take 1 tablet by mouth daily.   Yes Historical Provider, MD  Nutritional Supplements (IMMUNOCAL PO) Take 2 packets by mouth daily.   Yes Historical Provider, MD  Omega-3 Fatty Acids (OMEGA 3 PO) Take 2 capsules by mouth daily.   Yes Historical Provider, MD  ipratropium (ATROVENT) 0.03 % nasal spray Place 2 sprays into the nose 2 (two) times daily. Patient not taking: Reported on 09/06/2016 11/24/13   Marsh Dolly Marte, PA-C  ondansetron (ZOFRAN ODT) 4 MG disintegrating tablet Take 1 tablet (4 mg total) by mouth every 8 (eight) hours as needed for nausea or vomiting. 09/06/16   Sherwood Gambler, MD    Family History Family History  Problem Relation Age of Onset  . Cancer Mother 89    breast cancer  . Hypertension Mother   . Diabetes Father   . Cancer Sister     breast cancer  . Cancer  Sister     breast cancer    Social History Social History  Substance Use Topics  . Smoking status: Never Smoker  . Smokeless tobacco: Never Used  . Alcohol use No     Allergies   Asa [aspirin]   Review of Systems Review of Systems  Eyes: Positive for photophobia and visual disturbance.  Gastrointestinal: Positive for nausea. Negative for vomiting.  Musculoskeletal: Negative for neck pain.  Neurological: Positive for dizziness and headaches. Negative for weakness and numbness.  All other systems reviewed and are negative.    Physical Exam Updated Vital Signs BP 129/62   Pulse 64   Temp 98.1 F (36.7 C) (Oral)   Resp 16   SpO2 100%   Physical  Exam  Constitutional: She is oriented to person, place, and time. She appears well-developed and well-nourished.  HENT:  Head: Normocephalic.    Right Ear: External ear normal.  Left Ear: External ear normal.  Nose: Nose normal.  Eyes: EOM are normal. Pupils are equal, round, and reactive to light. Right eye exhibits no discharge. Left eye exhibits no discharge.  Neck: Normal range of motion. Neck supple. No spinous process tenderness and no muscular tenderness present.  Cardiovascular: Normal rate, regular rhythm and normal heart sounds.   Pulmonary/Chest: Effort normal and breath sounds normal.  Abdominal: Soft. There is no tenderness.  Neurological: She is alert and oriented to person, place, and time.  CN 3-12 grossly intact. 5/5 strength in all 4 extremities. Grossly normal sensation. Normal finger to nose.   Skin: Skin is warm and dry.  Nursing note and vitals reviewed.    ED Treatments / Results  Labs (all labs ordered are listed, but only abnormal results are displayed) Labs Reviewed - No data to display  EKG  EKG Interpretation None       Radiology Ct Head Wo Contrast  Result Date: 09/06/2016 CLINICAL DATA:  Struck in the head by a car hood. Possible loss of consciousness. Headache and blurred vision. EXAM: CT HEAD WITHOUT CONTRAST TECHNIQUE: Contiguous axial images were obtained from the base of the skull through the vertex without intravenous contrast. COMPARISON:  None. FINDINGS: Brain: No evidence of malformation, atrophy, old or acute small or large vessel infarction, mass lesion, hemorrhage, hydrocephalus or extra-axial collection. No evidence of pituitary lesion. Vascular: There is atherosclerotic calcification of the major vessels at the base of the brain. Skull: Normal.  No fracture or focal bone lesion. Sinuses/Orbits: Visualized sinuses are clear. No fluid in the middle ears or mastoids. Visualized orbits are normal. Other: None significant IMPRESSION: No  acute or traumatic finding.  Normal appearance of the brain. Electronically Signed   By: Nelson Chimes M.D.   On: 09/06/2016 11:41    Procedures Procedures (including critical care time)  Medications Ordered in ED Medications  ondansetron (ZOFRAN-ODT) disintegrating tablet 4 mg (4 mg Oral Given 09/06/16 1056)  acetaminophen (TYLENOL) tablet 650 mg (650 mg Oral Given 09/06/16 1056)     Initial Impression / Assessment and Plan / ED Course  I have reviewed the triage vital signs and the nursing notes.  Pertinent labs & imaging results that were available during my care of the patient were reviewed by me and considered in my medical decision making (see chart for details).  Clinical Course as of Sep 06 1525  Thu Sep 06, 2016  1028 Tylenol, zofran odt, head CT. Likely concussion. Neuro exam benign  [SG]    Clinical Course User Index [SG] Sherwood Gambler,  MD    CT scan shows no acute intracranial injury. She feels better with Zofran and Tylenol. Treat as a concussion, discharge with Zofran, use ibuprofen and Tylenol at home. Discussed return precautions. Neuro exam unremarkable.  Final Clinical Impressions(s) / ED Diagnoses   Final diagnoses:  Contusion of forehead, initial encounter  Concussion without loss of consciousness, initial encounter    New Prescriptions Discharge Medication List as of 09/06/2016 12:23 PM    START taking these medications   Details  ondansetron (ZOFRAN ODT) 4 MG disintegrating tablet Take 1 tablet (4 mg total) by mouth every 8 (eight) hours as needed for nausea or vomiting., Starting Thu 09/06/2016, Print         Sherwood Gambler, MD 09/06/16 1527

## 2016-09-06 NOTE — ED Notes (Signed)
Patient transported to CT 

## 2016-10-23 DIAGNOSIS — J0140 Acute pansinusitis, unspecified: Secondary | ICD-10-CM | POA: Diagnosis not present

## 2016-10-29 DIAGNOSIS — R030 Elevated blood-pressure reading, without diagnosis of hypertension: Secondary | ICD-10-CM | POA: Diagnosis not present

## 2016-10-29 DIAGNOSIS — G44311 Acute post-traumatic headache, intractable: Secondary | ICD-10-CM | POA: Diagnosis not present

## 2016-10-29 DIAGNOSIS — H538 Other visual disturbances: Secondary | ICD-10-CM | POA: Diagnosis not present

## 2016-10-29 DIAGNOSIS — R0981 Nasal congestion: Secondary | ICD-10-CM | POA: Diagnosis not present

## 2016-11-05 DIAGNOSIS — R05 Cough: Secondary | ICD-10-CM | POA: Diagnosis not present

## 2016-11-05 DIAGNOSIS — J209 Acute bronchitis, unspecified: Secondary | ICD-10-CM | POA: Diagnosis not present

## 2016-11-05 DIAGNOSIS — H66001 Acute suppurative otitis media without spontaneous rupture of ear drum, right ear: Secondary | ICD-10-CM | POA: Diagnosis not present

## 2016-11-19 ENCOUNTER — Ambulatory Visit (INDEPENDENT_AMBULATORY_CARE_PROVIDER_SITE_OTHER): Payer: Medicare Other | Admitting: Neurology

## 2016-11-19 ENCOUNTER — Encounter: Payer: Self-pay | Admitting: Neurology

## 2016-11-19 VITALS — BP 137/72 | HR 62 | Ht 68.0 in | Wt 193.5 lb

## 2016-11-19 DIAGNOSIS — G44321 Chronic post-traumatic headache, intractable: Secondary | ICD-10-CM | POA: Insufficient documentation

## 2016-11-19 MED ORDER — TOPIRAMATE 25 MG PO TABS: ORAL_TABLET | ORAL | 3 refills | Status: DC

## 2016-11-19 NOTE — Patient Instructions (Signed)
   Topamax (topiramate) is a seizure medication that has an FDA approval for seizures and for migraine headache. Potential side effects of this medication include weight loss, cognitive slowing, tingling in the fingers and toes, and carbonated drinks will taste bad. If any significant side effects are noted on this drug, please contact our office.  
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
## 2016-11-23 DIAGNOSIS — H25813 Combined forms of age-related cataract, bilateral: Secondary | ICD-10-CM | POA: Diagnosis not present

## 2016-11-27 DIAGNOSIS — J310 Chronic rhinitis: Secondary | ICD-10-CM | POA: Diagnosis not present

## 2016-11-27 DIAGNOSIS — J343 Hypertrophy of nasal turbinates: Secondary | ICD-10-CM | POA: Diagnosis not present

## 2016-11-28 ENCOUNTER — Other Ambulatory Visit (INDEPENDENT_AMBULATORY_CARE_PROVIDER_SITE_OTHER): Payer: Self-pay | Admitting: Otolaryngology

## 2016-11-28 DIAGNOSIS — J329 Chronic sinusitis, unspecified: Secondary | ICD-10-CM

## 2016-11-29 ENCOUNTER — Ambulatory Visit
Admission: RE | Admit: 2016-11-29 | Discharge: 2016-11-29 | Disposition: A | Discharge status: Home / Self Care | Payer: Medicare Other | Source: Ambulatory Visit | Attending: Otolaryngology | Admitting: Otolaryngology

## 2016-11-29 DIAGNOSIS — J343 Hypertrophy of nasal turbinates: Secondary | ICD-10-CM

## 2016-11-29 DIAGNOSIS — J329 Chronic sinusitis, unspecified: Secondary | ICD-10-CM

## 2016-11-29 HISTORY — DX: Hypertrophy of nasal turbinates: J34.3

## 2017-01-19 DIAGNOSIS — J019 Acute sinusitis, unspecified: Secondary | ICD-10-CM | POA: Diagnosis not present

## 2017-01-19 DIAGNOSIS — R05 Cough: Secondary | ICD-10-CM | POA: Diagnosis not present

## 2017-01-24 DIAGNOSIS — E7800 Pure hypercholesterolemia, unspecified: Secondary | ICD-10-CM | POA: Diagnosis not present

## 2017-01-24 DIAGNOSIS — E010 Iodine-deficiency related diffuse (endemic) goiter: Secondary | ICD-10-CM | POA: Diagnosis not present

## 2017-01-24 DIAGNOSIS — R7303 Prediabetes: Secondary | ICD-10-CM | POA: Diagnosis not present

## 2017-01-24 DIAGNOSIS — Z853 Personal history of malignant neoplasm of breast: Secondary | ICD-10-CM | POA: Diagnosis not present

## 2017-01-24 DIAGNOSIS — Z0000 Encounter for general adult medical examination without abnormal findings: Secondary | ICD-10-CM | POA: Diagnosis not present

## 2017-01-29 ENCOUNTER — Other Ambulatory Visit: Payer: Self-pay

## 2017-01-29 ENCOUNTER — Other Ambulatory Visit: Payer: Self-pay | Admitting: Hematology and Oncology

## 2017-01-29 DIAGNOSIS — Z0001 Encounter for general adult medical examination with abnormal findings: Secondary | ICD-10-CM | POA: Diagnosis not present

## 2017-01-29 DIAGNOSIS — Z1231 Encounter for screening mammogram for malignant neoplasm of breast: Secondary | ICD-10-CM

## 2017-01-29 DIAGNOSIS — Z853 Personal history of malignant neoplasm of breast: Secondary | ICD-10-CM | POA: Diagnosis not present

## 2017-01-29 DIAGNOSIS — C50412 Malignant neoplasm of upper-outer quadrant of left female breast: Secondary | ICD-10-CM

## 2017-01-29 DIAGNOSIS — E7800 Pure hypercholesterolemia, unspecified: Secondary | ICD-10-CM | POA: Diagnosis not present

## 2017-01-29 DIAGNOSIS — R079 Chest pain, unspecified: Secondary | ICD-10-CM | POA: Diagnosis not present

## 2017-01-30 ENCOUNTER — Other Ambulatory Visit: Payer: Self-pay | Admitting: Internal Medicine

## 2017-01-30 DIAGNOSIS — E010 Iodine-deficiency related diffuse (endemic) goiter: Secondary | ICD-10-CM

## 2017-02-01 DIAGNOSIS — L0390 Cellulitis, unspecified: Secondary | ICD-10-CM | POA: Diagnosis not present

## 2017-02-01 DIAGNOSIS — T50A95A Adverse effect of other bacterial vaccines, initial encounter: Secondary | ICD-10-CM | POA: Diagnosis not present

## 2017-02-05 ENCOUNTER — Ambulatory Visit: Payer: Medicare Other | Attending: Internal Medicine | Admitting: Physical Therapy

## 2017-02-05 ENCOUNTER — Encounter: Payer: Self-pay | Admitting: Physical Therapy

## 2017-02-05 DIAGNOSIS — M25512 Pain in left shoulder: Secondary | ICD-10-CM

## 2017-02-05 DIAGNOSIS — M542 Cervicalgia: Secondary | ICD-10-CM | POA: Diagnosis not present

## 2017-02-05 DIAGNOSIS — R293 Abnormal posture: Secondary | ICD-10-CM | POA: Diagnosis not present

## 2017-02-05 DIAGNOSIS — M79602 Pain in left arm: Secondary | ICD-10-CM | POA: Insufficient documentation

## 2017-02-05 NOTE — Therapy (Signed)
[place], [place], [address] Phone: [phone]   Fax:  [phone]  Physical Therapy Evaluation  Patient Details  Name: Jordan Willis MRN: [id number] Date of Birth: [birth year] Referring Provider: [name], MD  Encounter Date: 02/05/2017      PT End of Session - 02/05/17 1102    Visit Number 1   Number of Visits 17   Date for PT Re-Evaluation 04/05/17   Authorization Type UHC MCR   PT Start Time 1102   PT Stop Time 1149   PT Time Calculation (min) 47 min   Activity Tolerance Patient tolerated treatment well   Behavior During Therapy [facility] for tasks assessed/performed      Past Medical History:  Diagnosis Date  . Arthritis    Lumbar spine DDD.  Marland Kitchen Breast cancer, female, left 03/03/2012  . 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 Frequent sinus infections   . Hyperlipidemia   . Menopause 03/03/2012  . Nephrolithiasis   . Sinusitis 2014    Past Surgical History:  Procedure Laterality Date  . BILATERAL SALPINGOOPHORECTOMY Bilateral   . BREAST SURGERY Left 2006   invasive mammary cancer/high grade ductal carcinoma; chemotherapy and radiation  . BREAST SURGERY Right 2009   followed by radiation  . COLONOSCOPY  10/22/2009   Normal.  Repeat 5 years.  Nat [name]  . TUBAL LIGATION      There were no vitals filed for this visit.       Subjective Assessment - 02/05/17 1104    Subjective Pt reports history of CA surgery in 2006 L (breast surgery also in 2009 on R). L shoulder scope after one of the breast surgeries. Pt reports constant shoulder pain and numbness into arm. Pain began about a month ago as she tried to exercise. Pt reports cervical muscle spasm and neck stiffness. Occipital HA. Has been in remission for 10 yeas, mammogram in May.    How long can you sit comfortably? does not feel limited, moves around a lot.    Patient Stated Goals decrease pain, reach up for dishes  with L hand, lay flat on floor-both shoulders on floor, return to yoga (chair yoga), decrease numbness/swelling in hand, sleep   Currently in Pain? Yes   Pain Score 7    Pain Location Shoulder   Pain Orientation Left   Pain Descriptors / Indicators Tightness;Aching   Pain Type Chronic pain   Pain Radiating Towards cervical   Pain Onset 1 to 4 weeks ago   Pain Frequency Constant   Aggravating Factors  reaching, forcing ranges of motion, lifting   Pain Relieving Factors pain meds, heating pads            [facility] PT Assessment - 02/05/17 0001      Assessment   Medical Diagnosis adhesive capsulitis   Referring Provider [name], MD   Hand Dominance Right   Next MD Visit late April   Prior Therapy not this year     Precautions   Precaution Comments CA- 10 year remission     Restrictions   Weight Bearing Restrictions No     Balance Screen   Has the patient fallen in the past 6 months Yes   How many times? 1   Has the patient had a decrease in activity level because of a fear of falling?  No   Is the patient reluctant to leave their home because of a fear of falling?  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, very active     Cognition   Overall Cognitive Status Within Functional Limits for tasks assessed     Observation/Other Assessments   Focus on Therapeutic Outcomes (FOTO)  45% ability (goal 60%)     Sensation   Additional Comments numbness in L hand     Posture/Postural Control   Posture Comments flat thoracic spine with forward rounded shoulders     ROM / Strength   AROM / PROM / Strength AROM;PROM;Strength     AROM   AROM Assessment Site Shoulder   Right/Left Shoulder Left;Right   Right Shoulder Extension 64 Degrees   Right Shoulder Flexion 158 Degrees    Right Shoulder ABduction 155 Degrees   Left Shoulder Extension 26 Degrees   Left Shoulder Flexion 105 Degrees  pain   Left Shoulder ABduction 73 Degrees     PROM   Overall PROM Comments limited by pt guarding   PROM Assessment Site Shoulder   Right/Left Shoulder Left   Left Shoulder Flexion --  just shy of 90 deg, increased numbness and pain   Left Shoulder ABduction 70 Degrees     Strength   Overall Strength Comments gross strength 3-/5 (unable to move through avail ROM     Palpation   Palpation comment TTP L upper trap and periscapular region                   [facility] Adult PT Treatment/Exercise - 02/05/17 0001      Exercises   Exercises Shoulder     Shoulder Exercises: Seated   Other Seated Exercises scapular retraction for postural alignment     Shoulder Exercises: Stretch   Table Stretch -Flexion Limitations seated with arms sliding on table (demo and explanation only)     Manual Therapy   Manual Therapy Joint mobilization   Manual therapy comments edu on use of theracane    Joint Mobilization AP L GHJ Gr3                 PT Education - 02/05/17 1511    Education provided Yes   Education Details anatomy of condition, POC, HEP, exercise form/rationala   Person(s) Educated Patient   Methods Explanation;Demonstration;Tactile cues;Verbal cues   Comprehension Verbalized understanding;Returned demonstration;Verbal cues required;Tactile cues required;Need further instruction          PT Short Term Goals - 02/05/17 1517      PT SHORT TERM GOAL #1   Title Pt will demo 10 deg increase in AROM by 5/11   Baseline see flowsheet   Time 3   Period Weeks   Status New     PT SHORT TERM GOAL #2   Title Pt will verbalize improved ability to sleep at night, using stretches, exercises and positioning to decrease pain   Baseline will continue to educate and find appropraite stretches/exercises   Time 3   Period Weeks   Status New           PT  Long Term Goals - 02/05/17 1518      PT LONG TERM GOAL #1   Title FOTO to 60% ability to indicate significant improvement in functional ability by 6/15   Baseline 41% ability at eval  Time 8   Period Weeks   Status New     PT LONG TERM GOAL #2   Title Pt will report average pain <=3/10 do decrease pain effects on daily activities    Baseline 7/10 at eval   Time 8   Period Weeks   Status New     PT LONG TERM GOAL #3   Title Pt will be able to use L upper extremity in putting away and reaching for dishes in upper cabinets   Baseline limited by strength and ROM at eval   Time 8   Period Weeks   Status New     PT LONG TERM GOAL #4   Title Pt will have resolution of numbness to L hand to improve grip and manipulation ability   Baseline constant at eval   Time 8   Period Weeks   Status New               Plan - 02/05/17 1512    Clinical Impression Statement Pt presents to PT with complaints of constant L shoulder pain that has spread to her cervical and L upper extremity regions. Pt reports overall decreased functional use of upper extremity. Pt with weakness, limited ROM, pain and stiffness inf GHJ consisted with adhesive capsulitis. Pt was educated on progression and timeline that is to be expected with her diagnosis. Will consider adding DN to POC if her next mammogram is clear due to history of CA. Pt will benefit from skilled PT in order to decrease surrounding myofasical pain and improve functional use of L arm.    Rehab Potential Good   PT Frequency 2x / week   PT Duration 8 weeks   PT Treatment/Interventions ADLs/Self Care Home Management;Cryotherapy;Electrical Stimulation;Iontophoresis 4mg /ml Dexamethasone;Functional mobility training;Ultrasound;Traction;Moist Heat;Therapeutic activities;Therapeutic exercise;Neuromuscular re-education;Patient/family education;Passive range of motion;Manual techniques;Dry needling;Taping   PT Next Visit Plan pulleys, GHJ capsule  stretching, periscapular tracking   PT Home Exercise Plan putty grip, scapular retraction, GHJ flexion stretch on table   Consulted and Agree with Plan of Care Patient      Patient will benefit from skilled therapeutic intervention in order to improve the following deficits and impairments:  Decreased range of motion, Impaired UE functional use, Increased muscle spasms, Decreased activity tolerance, Pain, Improper body mechanics, Impaired flexibility, Hypomobility, Decreased mobility, Decreased strength, Impaired sensation, Postural dysfunction  Visit Diagnosis: Acute pain of left shoulder - Plan: PT plan of care cert/re-cert  Pain in left arm - Plan: PT plan of care cert/re-cert  Cervicalgia - Plan: PT plan of care cert/re-cert  Abnormal posture - Plan: PT plan of care cert/re-cert      G-Codes - 29/79/89 1521    Functional Assessment Tool Used (Outpatient Only) FOTO 41% ability (goal 60%), functional AROM, clinical judgement   Functional Limitation Carrying, moving and handling objects   Carrying, Moving and Handling Objects Current Status (Q1194) At least 60 percent but less than 80 percent impaired, limited or restricted   Carrying, Moving and Handling Objects Goal Status (R7408) At least 20 percent but less than 40 percent impaired, limited or restricted       Problem List Patient Active Problem List   Diagnosis Date Noted  . 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 Breast cancer of upper-outer quadrant of left female breast ([place]) 03/03/2012  . 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 Menopause 03/03/2012    Yussuf Sawyers C. Risa Auman PT, DPT 02/05/17 3:24 PM   [facility] [address] [place], [place], [address] Phone: [phone]   Fax:  [phone]  Name: Jordan Willis MRN: [id number] Date of Birth: Oct 31, 1950

## 2017-02-06 DIAGNOSIS — R0989 Other specified symptoms and signs involving the circulatory and respiratory systems: Secondary | ICD-10-CM | POA: Diagnosis not present

## 2017-02-06 DIAGNOSIS — R079 Chest pain, unspecified: Secondary | ICD-10-CM | POA: Diagnosis not present

## 2017-02-06 DIAGNOSIS — M79606 Pain in leg, unspecified: Secondary | ICD-10-CM | POA: Diagnosis not present

## 2017-02-06 DIAGNOSIS — G4710 Hypersomnia, unspecified: Secondary | ICD-10-CM | POA: Diagnosis not present

## 2017-02-07 ENCOUNTER — Ambulatory Visit
Admission: RE | Admit: 2017-02-07 | Discharge: 2017-02-07 | Disposition: A | Discharge status: Home / Self Care | Payer: Medicare Other | Source: Ambulatory Visit | Attending: Hematology and Oncology | Admitting: Hematology and Oncology

## 2017-02-07 DIAGNOSIS — Z1231 Encounter for screening mammogram for malignant neoplasm of breast: Secondary | ICD-10-CM

## 2017-02-07 HISTORY — DX: Personal history of irradiation: Z92.3

## 2017-02-07 HISTORY — DX: Personal history of antineoplastic chemotherapy: Z92.21

## 2017-02-12 ENCOUNTER — Ambulatory Visit: Payer: Medicare Other | Admitting: Physical Therapy

## 2017-02-13 ENCOUNTER — Encounter: Payer: Self-pay | Admitting: Physical Therapy

## 2017-02-13 ENCOUNTER — Ambulatory Visit: Payer: Medicare Other | Admitting: Physical Therapy

## 2017-02-13 DIAGNOSIS — R079 Chest pain, unspecified: Secondary | ICD-10-CM | POA: Diagnosis not present

## 2017-02-13 DIAGNOSIS — M542 Cervicalgia: Secondary | ICD-10-CM | POA: Diagnosis not present

## 2017-02-13 DIAGNOSIS — R293 Abnormal posture: Secondary | ICD-10-CM

## 2017-02-13 DIAGNOSIS — M79602 Pain in left arm: Secondary | ICD-10-CM

## 2017-02-13 DIAGNOSIS — M25512 Pain in left shoulder: Secondary | ICD-10-CM | POA: Diagnosis not present

## 2017-02-13 NOTE — Therapy (Signed)
[place], [place], [address] Phone: [phone]   Fax:  [phone]  Physical Therapy Treatment  Patient Details  Name: Jordan Willis MRN: [id number] Date of Birth: October 07, 1951 Referring Provider: [name], MD  Encounter Date: 02/13/2017      PT End of Session - 02/13/17 1549    Visit Number 2   Number of Visits 17   Date for PT Re-Evaluation 04/05/17   Authorization Type UHC MCR   PT Start Time 6948   PT Stop Time 1639   PT Time Calculation (min) 50 min   Activity Tolerance Patient tolerated treatment well   Behavior During Therapy [facility] for tasks assessed/performed      Past Medical History:  Diagnosis Date  . Arthritis    Lumbar spine DDD.  Marland Kitchen Breast cancer, female, left 03/03/2012  . 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 Frequent sinus infections   . Hyperlipidemia   . Menopause 03/03/2012  . Nephrolithiasis   . Personal history of chemotherapy   . Personal history of radiation therapy   . Sinusitis 2014    Past Surgical History:  Procedure Laterality Date  . BILATERAL SALPINGOOPHORECTOMY Bilateral   . BREAST BIOPSY Right 2009  . BREAST LUMPECTOMY Right 2009  . BREAST LUMPECTOMY Left 2006  . BREAST SURGERY Left 2006   invasive mammary cancer/high grade ductal carcinoma; chemotherapy and radiation  . BREAST SURGERY Right 2009   followed by radiation  . COLONOSCOPY  10/22/2009   Normal.  Repeat 5 years.  Nat [name]  . TUBAL LIGATION      There were no vitals filed for this visit.      Subjective Assessment - 02/13/17 1549    Subjective Pt reports shoulder has not been feeling so great today.    Patient Stated Goals decrease pain, reach up for dishes with L hand, lay flat on floor-both shoulders on floor, return to yoga (chair yoga), decrease numbness/swelling in hand, sleep   Currently in Pain? Yes   Pain Score 6    Pain Location Shoulder   Pain  Orientation Left   Pain Descriptors / Indicators Tightness                         [facility] Adult PT Treatment/Exercise - 02/13/17 0001      Shoulder Exercises: Pulleys   Flexion 3 minutes     Shoulder Exercises: Stretch   Internal Rotation Stretch Limitations using green strap   Table Stretch -Flexion Limitations seated table slides 5x 3 deep breaths   Other Shoulder Stretches sleeper stretch     Modalities   Modalities Moist Heat     Moist Heat Therapy   Number Minutes Moist Heat 10 Minutes   Moist Heat Location Shoulder;Cervical  L     Manual Therapy   Manual Therapy Passive ROM;Soft tissue mobilization;Myofascial release   Joint Mobilization cervical lateral mobs   Soft tissue mobilization L shoulder and cervical region   Myofascial Release L upper trap, levator, suboccipital release   Passive ROM GHJ flexion, IR, ER                PT Education - 02/13/17 1553    Education provided Yes   Education Details exercise form/rationale, HEP   Person(s) Educated Patient   Methods Explanation;Demonstration;Tactile cues;Verbal cues   Comprehension Verbalized understanding;Returned demonstration;Verbal cues required;Tactile cues required;Need further instruction  PT Short Term Goals - 02/05/17 1517      PT SHORT TERM GOAL #1   Title Pt will demo 10 deg increase in AROM by 5/11   Baseline see flowsheet   Time 3   Period Weeks   Status New     PT SHORT TERM GOAL #2   Title Pt will verbalize improved ability to sleep at night, using stretches, exercises and positioning to decrease pain   Baseline will continue to educate and find appropraite stretches/exercises   Time 3   Period Weeks   Status New           PT Long Term Goals - 02/05/17 1518      PT LONG TERM GOAL #1   Title FOTO to 60% ability to indicate significant improvement in functional ability by 6/15   Baseline 41% ability at eval   Time 8   Period Weeks   Status New      PT LONG TERM GOAL #2   Title Pt will report average pain <=3/10 do decrease pain effects on daily activities    Baseline 7/10 at eval   Time 8   Period Weeks   Status New     PT LONG TERM GOAL #3   Title Pt will be able to use L upper extremity in putting away and reaching for dishes in upper cabinets   Baseline limited by strength and ROM at eval   Time 8   Period Weeks   Status New     PT LONG TERM GOAL #4   Title Pt will have resolution of numbness to L hand to improve grip and manipulation ability   Baseline constant at eval   Time 8   Period Weeks   Status New               Plan - 02/13/17 1632    Clinical Impression Statement Pt with significant tightness of cervical musculature and trigger points recreating concordant pain. Worked in tolerable ranges to improve GHJ ROM and irritation produced by excessive tightness. Pt reported 4/10 pain with more soreness than pain following treatment.    PT Next Visit Plan pulleys, GHJ capsule stretching, periscapular tracking   PT Home Exercise Plan putty grip, scapular retraction, GHJ flexion stretch on table   Consulted and Agree with Plan of Care Patient      Patient will benefit from skilled therapeutic intervention in order to improve the following deficits and impairments:     Visit Diagnosis: Acute pain of left shoulder  Pain in left arm  Cervicalgia  Abnormal posture     Problem List Patient Active Problem List   Diagnosis Date Noted  . 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 Breast cancer of upper-outer quadrant of left female breast ([place]) 03/03/2012  . 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 Menopause 03/03/2012   Aaryn Sermon C. Jerline Linzy PT, DPT 02/13/17 4:37 PM   [facility] [address] [place], [place], [address] Phone: [phone]   Fax:  [phone]  Name: KISSIE ZIOLKOWSKI MRN: [id number] Date of  Birth: October 01, 1951

## 2017-02-14 ENCOUNTER — Other Ambulatory Visit: Payer: Medicare Other

## 2017-02-14 DIAGNOSIS — R079 Chest pain, unspecified: Secondary | ICD-10-CM | POA: Diagnosis not present

## 2017-02-14 DIAGNOSIS — M79606 Pain in leg, unspecified: Secondary | ICD-10-CM | POA: Diagnosis not present

## 2017-02-14 DIAGNOSIS — I6523 Occlusion and stenosis of bilateral carotid arteries: Secondary | ICD-10-CM | POA: Diagnosis not present

## 2017-02-14 DIAGNOSIS — R0989 Other specified symptoms and signs involving the circulatory and respiratory systems: Secondary | ICD-10-CM | POA: Diagnosis not present

## 2017-02-18 ENCOUNTER — Ambulatory Visit: Payer: Medicare Other | Admitting: Physical Therapy

## 2017-02-18 ENCOUNTER — Ambulatory Visit
Admission: RE | Admit: 2017-02-18 | Discharge: 2017-02-18 | Disposition: A | Discharge status: Home / Self Care | Payer: Medicare Other | Source: Ambulatory Visit | Attending: Internal Medicine | Admitting: Internal Medicine

## 2017-02-18 ENCOUNTER — Encounter: Payer: Self-pay | Admitting: Physical Therapy

## 2017-02-18 ENCOUNTER — Ambulatory Visit: Payer: Medicare Other | Admitting: Adult Health

## 2017-02-18 DIAGNOSIS — R293 Abnormal posture: Secondary | ICD-10-CM | POA: Diagnosis not present

## 2017-02-18 DIAGNOSIS — E041 Nontoxic single thyroid nodule: Secondary | ICD-10-CM | POA: Diagnosis not present

## 2017-02-18 DIAGNOSIS — M25512 Pain in left shoulder: Secondary | ICD-10-CM

## 2017-02-18 DIAGNOSIS — M542 Cervicalgia: Secondary | ICD-10-CM

## 2017-02-18 DIAGNOSIS — E010 Iodine-deficiency related diffuse (endemic) goiter: Secondary | ICD-10-CM

## 2017-02-18 DIAGNOSIS — M79602 Pain in left arm: Secondary | ICD-10-CM | POA: Diagnosis not present

## 2017-02-18 NOTE — Therapy (Signed)
[place], [place], [address] Phone: [phone]   Fax:  [phone]  Physical Therapy Treatment  Patient Details  Name: Jordan Willis MRN: [id number] Date of Birth: 1951/10/15 Referring Provider: [name], MD  Encounter Date: 02/18/2017      PT End of Session - 02/18/17 1418    Visit Number 3   Number of Visits 17   Date for PT Re-Evaluation 04/05/17   Authorization Type [employer] MCR   PT Start Time 4665   PT Stop Time 1506   PT Time Calculation (min) 48 min   Activity Tolerance Patient tolerated treatment well   Behavior During Therapy [facility] for tasks assessed/performed      Past Medical History:  Diagnosis Date  . Arthritis    Lumbar spine DDD.  Marland Kitchen Breast cancer, female, left 03/03/2012  . 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 Frequent sinus infections   . Hyperlipidemia   . Menopause 03/03/2012  . Nephrolithiasis   . Personal history of chemotherapy   . Personal history of radiation therapy   . Sinusitis 2014    Past Surgical History:  Procedure Laterality Date  . BILATERAL SALPINGOOPHORECTOMY Bilateral   . BREAST BIOPSY Right 2009  . BREAST LUMPECTOMY Right 2009  . BREAST LUMPECTOMY Left 2006  . BREAST SURGERY Left 2006   invasive mammary cancer/high grade ductal carcinoma; chemotherapy and radiation  . BREAST SURGERY Right 2009   followed by radiation  . COLONOSCOPY  10/22/2009   Normal.  Repeat 5 years.  Nat [name]  . TUBAL LIGATION      There were no vitals filed for this visit.      Subjective Assessment - 02/18/17 1419    Subjective Pt reports steady ache in shoulder and has been using heat. Using pulleys and exercises at home. Was feeling good after last visit and thinks she may have overdone it.    Patient Stated Goals decrease pain, reach up for dishes with L hand, lay flat on floor-both shoulders on floor, return to yoga (chair yoga), decrease  numbness/swelling in hand, sleep   Currently in Pain? Yes   Pain Score 7    Pain Location Shoulder   Pain Orientation Left   Pain Descriptors / Indicators Aching                         [facility] Adult PT Treatment/Exercise - 02/18/17 0001      Shoulder Exercises: Supine   Protraction Limitations dowel in hands   Other Supine Exercises chest press to overhead flexion with dowel   Other Supine Exercises retraction with elbow and hand presses into table     Shoulder Exercises: Prone   Retraction Limitations retraction with extension x10, 3s holds     Shoulder Exercises: Sidelying   External Rotation 20 reps;10 reps     Shoulder Exercises: Standing   Other Standing Exercises pendulums at table     Shoulder Exercises: Pulleys   Flexion 3 minutes     Shoulder Exercises: Stretch   Internal Rotation Stretch Limitations using green strap   Other Shoulder Stretches sleeper stretch   Other Shoulder Stretches levator and upper trap     Moist Heat Therapy   Number Minutes Moist Heat 10 Minutes   Moist Heat Location Shoulder     Manual Therapy   Joint Mobilization scapulo-thoracic mobilization and distractoin   Myofascial Release pectoralis anterior, subscap  Passive ROM flexion & ER                PT Education - 02/18/17 1501    Education provided Yes   Education Details exercise form/rationale   Person(s) Educated Patient   Methods Explanation;Demonstration;Tactile cues;Verbal cues;Handout   Comprehension Verbalized understanding;Returned demonstration;Verbal cues required;Tactile cues required;Need further instruction          PT Short Term Goals - 02/05/17 1517      PT SHORT TERM GOAL #1   Title Pt will demo 10 deg increase in AROM by 5/11   Baseline see flowsheet   Time 3   Period Weeks   Status New     PT SHORT TERM GOAL #2   Title Pt will verbalize improved ability to sleep at night, using stretches, exercises and positioning to decrease  pain   Baseline will continue to educate and find appropraite stretches/exercises   Time 3   Period Weeks   Status New           PT Long Term Goals - 02/05/17 1518      PT LONG TERM GOAL #1   Title FOTO to 60% ability to indicate significant improvement in functional ability by 6/15   Baseline 41% ability at eval   Time 8   Period Weeks   Status New     PT LONG TERM GOAL #2   Title Pt will report average pain <=3/10 do decrease pain effects on daily activities    Baseline 7/10 at eval   Time 8   Period Weeks   Status New     PT LONG TERM GOAL #3   Title Pt will be able to use L upper extremity in putting away and reaching for dishes in upper cabinets   Baseline limited by strength and ROM at eval   Time 8   Period Weeks   Status New     PT LONG TERM GOAL #4   Title Pt will have resolution of numbness to L hand to improve grip and manipulation ability   Baseline constant at eval   Time 8   Period Weeks   Status New               Plan - 02/18/17 1458    Clinical Impression Statement Notable trigger points in L subscap and pectorallis recreated distal sensations to wrist and hand. GHJ flexion, passive, approx 130 deg without pain. Scapula noted to be bound and will benefit from further distraciton. Pt c/o fatigue during prone and sidelying exercises today.    PT Next Visit Plan scapular distraction, periscapular endurance   PT Home Exercise Plan putty grip, scapular retraction, GHJ flexion stretch on table; upper trap and levator stretch, IR stretch with strap, flexion stretch up wall.    Consulted and Agree with Plan of Care Patient      Patient will benefit from skilled therapeutic intervention in order to improve the following deficits and impairments:     Visit Diagnosis: Acute pain of left shoulder  Cervicalgia  Pain in left arm  Abnormal posture     Problem List Patient Active Problem List   Diagnosis Date Noted  . Intractable chronic  post-traumatic headache 11/19/2016  . Breast cancer of upper-outer quadrant of left female breast ([place]) 03/03/2012  . 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 Menopause 03/03/2012    Rowene Suto C. Kejuan Bekker PT, DPT 02/18/17 3:02 PM   [facility]  [place], [place], [address] Phone: [phone]   Fax:  [phone]  Name: ASSYRIA MORREALE MRN: [id number] Date of Birth: Jun 27, 1951

## 2017-02-20 ENCOUNTER — Ambulatory Visit
Admission: RE | Admit: 2017-02-20 | Discharge: 2017-02-20 | Disposition: A | Discharge status: Home / Self Care | Payer: Medicare Other | Source: Ambulatory Visit | Attending: Hematology and Oncology | Admitting: Hematology and Oncology

## 2017-02-20 ENCOUNTER — Ambulatory Visit: Payer: Medicare Other | Attending: Internal Medicine | Admitting: Physical Therapy

## 2017-02-20 ENCOUNTER — Encounter: Payer: Self-pay | Admitting: Physical Therapy

## 2017-02-20 DIAGNOSIS — M542 Cervicalgia: Secondary | ICD-10-CM

## 2017-02-20 DIAGNOSIS — M79602 Pain in left arm: Secondary | ICD-10-CM | POA: Diagnosis not present

## 2017-02-20 DIAGNOSIS — M25512 Pain in left shoulder: Secondary | ICD-10-CM | POA: Diagnosis not present

## 2017-02-20 DIAGNOSIS — C50412 Malignant neoplasm of upper-outer quadrant of left female breast: Secondary | ICD-10-CM

## 2017-02-20 DIAGNOSIS — R293 Abnormal posture: Secondary | ICD-10-CM | POA: Insufficient documentation

## 2017-02-20 DIAGNOSIS — J310 Chronic rhinitis: Secondary | ICD-10-CM | POA: Diagnosis not present

## 2017-02-20 DIAGNOSIS — J343 Hypertrophy of nasal turbinates: Secondary | ICD-10-CM | POA: Diagnosis not present

## 2017-02-20 DIAGNOSIS — R51 Headache: Secondary | ICD-10-CM | POA: Diagnosis not present

## 2017-02-20 DIAGNOSIS — Z853 Personal history of malignant neoplasm of breast: Secondary | ICD-10-CM | POA: Diagnosis not present

## 2017-02-20 MED ORDER — GADOBENATE DIMEGLUMINE 529 MG/ML IV SOLN
18.0000 mL | Freq: Once | INTRAVENOUS | Status: AC | PRN
  Administered 2017-02-20: 18 mL via INTRAVENOUS

## 2017-02-20 NOTE — Therapy (Signed)
[place], [place], [address] Phone: [phone]   Fax:  [phone]  Physical Therapy Treatment  Patient Details  Name: [facility] Willis MRN: [id number] Date of Birth: 1951-02-02 Referring Provider: [name], MD  Encounter Date: 02/20/2017      PT End of Session - 02/20/17 1546    Visit Number 4   Number of Visits 17   Date for PT Re-Evaluation 04/05/17   Authorization Type UHC MCR   PT Start Time 4970   PT Stop Time 1635   PT Time Calculation (min) 49 min   Activity Tolerance Patient tolerated treatment well   Behavior During Therapy [facility] for tasks assessed/performed      Past Medical History:  Diagnosis Date  . Arthritis    Lumbar spine DDD.  Marland Kitchen Breast cancer, female, left 03/03/2012  . 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 Frequent sinus infections   . Hyperlipidemia   . Menopause 03/03/2012  . Nephrolithiasis   . Personal history of chemotherapy   . Personal history of radiation therapy   . Sinusitis 2014    Past Surgical History:  Procedure Laterality Date  . BILATERAL SALPINGOOPHORECTOMY Bilateral   . BREAST BIOPSY Right 2009  . BREAST LUMPECTOMY Right 2009  . BREAST LUMPECTOMY Left 2006  . BREAST SURGERY Left 2006   invasive mammary cancer/high grade ductal carcinoma; chemotherapy and radiation  . BREAST SURGERY Right 2009   followed by radiation  . COLONOSCOPY  10/22/2009   Normal.  Repeat 5 years.  Nat [name]  . TUBAL LIGATION      There were no vitals filed for this visit.      Subjective Assessment - 02/20/17 1549    Subjective Feels a steady discomfort in superior shoulder-upper trap region. Had an MRI today where she bled and was sick during.    Patient Stated Goals decrease pain, reach up for dishes with L hand, lay flat on floor-both shoulders on floor, return to yoga (chair yoga), decrease numbness/swelling in hand, sleep   Currently in Pain? Yes    Pain Score 4    Pain Location Shoulder   Pain Orientation Left   Pain Descriptors / Indicators Tightness;Aching                         [facility] Adult PT Treatment/Exercise - 02/20/17 0001      Shoulder Exercises: Seated   External Rotation 20 reps   Theraband Level (Shoulder External Rotation) Level 1 (Yellow)     Shoulder Exercises: Prone   Retraction Limitations retraction with extension  unable to clear hands from table in extension     Shoulder Exercises: Standing   Row 20 reps   Theraband Level (Shoulder Row) Level 3 (Green)     Shoulder Exercises: ROM/Strengthening   UBE (Upper Arm Bike) L1 fwd 2 min, back 2 min     Shoulder Exercises: Stretch   Table Stretch -Flexion Limitations standing walk away from table     Moist Heat Therapy   Number Minutes Moist Heat 10 Minutes  5 min concurrent with education   Moist Heat Location Shoulder     Manual Therapy   Manual therapy comments median nerve glides   Myofascial Release L pectoralis and upper trap                PT Education - 02/20/17 1550    Education provided Yes  Education Details exercise form/rationale, pain in shoulder girdle link to scapular pain, heat v ice and appropraite use, HEP   Person(s) Educated Patient   Methods Explanation;Demonstration;Tactile cues;Verbal cues   Comprehension Verbalized understanding;Returned demonstration;Verbal cues required;Tactile cues required;Need further instruction          PT Short Term Goals - 02/05/17 1517      PT SHORT TERM GOAL #1   Title Pt will demo 10 deg increase in AROM by 5/11   Baseline see flowsheet   Time 3   Period Weeks   Status New     PT SHORT TERM GOAL #2   Title Pt will verbalize improved ability to sleep at night, using stretches, exercises and positioning to decrease pain   Baseline will continue to educate and find appropraite stretches/exercises   Time 3   Period Weeks   Status New           PT Long  Term Goals - 02/05/17 1518      PT LONG TERM GOAL #1   Title FOTO to 60% ability to indicate significant improvement in functional ability by 6/15   Baseline 41% ability at eval   Time 8   Period Weeks   Status New     PT LONG TERM GOAL #2   Title Pt will report average pain <=3/10 do decrease pain effects on daily activities    Baseline 7/10 at eval   Time 8   Period Weeks   Status New     PT LONG TERM GOAL #3   Title Pt will be able to use L upper extremity in putting away and reaching for dishes in upper cabinets   Baseline limited by strength and ROM at eval   Time 8   Period Weeks   Status New     PT LONG TERM GOAL #4   Title Pt will have resolution of numbness to L hand to improve grip and manipulation ability   Baseline constant at eval   Time 8   Period Weeks   Status New               Plan - 02/20/17 1630    Clinical Impression Statement Pt verbalized decrease in pain following treatment and demo good tolerance to exercise. Reports pain at L scapula that she believes is a result of arthritis, provided with tennis ball for massage of area standing against wall.    PT Next Visit Plan check scapular mobility, periscapular endurance   PT Home Exercise Plan putty grip, scapular retraction, GHJ flexion stretch on table; upper trap and levator stretch, IR stretch with strap, flexion stretch up wall. resisted ER, rows   Consulted and Agree with Plan of Care Patient      Patient will benefit from skilled therapeutic intervention in order to improve the following deficits and impairments:     Visit Diagnosis: Acute pain of left shoulder  Cervicalgia  Pain in left arm  Abnormal posture     Problem List Patient Active Problem List   Diagnosis Date Noted  . 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 Breast cancer of upper-outer quadrant of left female breast ([place]) 03/03/2012  . 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  Menopause 03/03/2012   Henretta Quist C. Shanik Brookshire PT, DPT 02/20/17 5:24 PM   [facility] [address] [place], [place], [address] Phone: [phone]   Fax:  [phone]  Name: [facility] Willis MRN:  [id number] Date of Birth: 1951-08-29

## 2017-02-22 DIAGNOSIS — R079 Chest pain, unspecified: Secondary | ICD-10-CM | POA: Diagnosis not present

## 2017-02-22 DIAGNOSIS — I340 Nonrheumatic mitral (valve) insufficiency: Secondary | ICD-10-CM | POA: Diagnosis not present

## 2017-02-22 DIAGNOSIS — M79606 Pain in leg, unspecified: Secondary | ICD-10-CM | POA: Diagnosis not present

## 2017-02-22 DIAGNOSIS — I6523 Occlusion and stenosis of bilateral carotid arteries: Secondary | ICD-10-CM | POA: Diagnosis not present

## 2017-02-25 ENCOUNTER — Ambulatory Visit: Payer: Medicare Other | Admitting: Physical Therapy

## 2017-02-25 ENCOUNTER — Encounter: Payer: Self-pay | Admitting: Physical Therapy

## 2017-02-25 DIAGNOSIS — R293 Abnormal posture: Secondary | ICD-10-CM

## 2017-02-25 DIAGNOSIS — M542 Cervicalgia: Secondary | ICD-10-CM

## 2017-02-25 DIAGNOSIS — M79602 Pain in left arm: Secondary | ICD-10-CM

## 2017-02-25 DIAGNOSIS — M25512 Pain in left shoulder: Secondary | ICD-10-CM | POA: Diagnosis not present

## 2017-02-25 NOTE — Therapy (Signed)
[place], [place], [address] Phone: [phone]   Fax:  [phone]  Physical Therapy Treatment  Patient Details  Name: [facility] Willis MRN: [id number] Date of Birth: Oct 29, 1950 Referring Provider: [name], MD  Encounter Date: 02/25/2017      PT End of Session - 02/25/17 1554    Visit Number 5   Number of Visits 17   Date for PT Re-Evaluation 04/05/17   Authorization Type UHC MCR   PT Start Time 0102  pt arrived late   PT Stop Time 1631   PT Time Calculation (min) 37 min   Activity Tolerance Patient tolerated treatment well   Behavior During Therapy [facility] for tasks assessed/performed      Past Medical History:  Diagnosis Date  . Arthritis    Lumbar spine DDD.  Marland Kitchen Breast cancer, female, left 03/03/2012  . 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 Frequent sinus infections   . Hyperlipidemia   . Menopause 03/03/2012  . Nephrolithiasis   . Personal history of chemotherapy   . Personal history of radiation therapy   . Sinusitis 2014    Past Surgical History:  Procedure Laterality Date  . BILATERAL SALPINGOOPHORECTOMY Bilateral   . BREAST BIOPSY Right 2009  . BREAST LUMPECTOMY Right 2009  . BREAST LUMPECTOMY Left 2006  . BREAST SURGERY Left 2006   invasive mammary cancer/high grade ductal carcinoma; chemotherapy and radiation  . BREAST SURGERY Right 2009   followed by radiation  . COLONOSCOPY  10/22/2009   Normal.  Repeat 5 years.  Nat [name]  . TUBAL LIGATION      There were no vitals filed for this visit.      Subjective Assessment - 02/25/17 1555    Subjective pt reports shoulder has been "not bad"  feels like the discomfort is "in the connection, in the joint or something"   Patient Stated Goals decrease pain, reach up for dishes with L hand, lay flat on floor-both shoulders on floor, return to yoga (chair yoga), decrease numbness/swelling in hand, sleep   Currently  in Pain? Yes   Pain Score 4    Pain Location Shoulder   Pain Orientation Left   Pain Descriptors / Indicators Aching   Aggravating Factors  reach high in cabinets, leaned forward and put pressure through arm   Pain Relieving Factors rest, heat                         [facility] Adult PT Treatment/Exercise - 02/25/17 0001      Shoulder Exercises: Supine   Protraction Limitations serratus punches 1#     Shoulder Exercises: Prone   Retraction 10 reps  5s holds   Other Prone Exercises prone triceps kicks 10x3s holds     Shoulder Exercises: Sidelying   External Rotation 20 reps   External Rotation Weight (lbs) 1   External Rotation Limitations in sleeper stretch position     Shoulder Exercises: ROM/Strengthening   UBE (Upper Arm Bike) L1 3'/3'     Shoulder Exercises: Stretch   Other Shoulder Stretches sleeper stretch   Other Shoulder Stretches L pectoralis door stretch 3x15s     Modalities   Modalities Iontophoresis     Iontophoresis   Type of Iontophoresis Dexamethasone   Location L supraspinatus, distal attach   Dose 1cc   Time 6hr wear time  PT Short Term Goals - 02/05/17 1517      PT SHORT TERM GOAL #1   Title Pt will demo 10 deg increase in AROM by 5/11   Baseline see flowsheet   Time 3   Period Weeks   Status New     PT SHORT TERM GOAL #2   Title Pt will verbalize improved ability to sleep at night, using stretches, exercises and positioning to decrease pain   Baseline will continue to educate and find appropraite stretches/exercises   Time 3   Period Weeks   Status New           PT Long Term Goals - 02/05/17 1518      PT LONG TERM GOAL #1   Title FOTO to 60% ability to indicate significant improvement in functional ability by 6/15   Baseline 41% ability at eval   Time 8   Period Weeks   Status New     PT LONG TERM GOAL #2   Title Pt will report average pain <=3/10 do decrease pain effects on daily  activities    Baseline 7/10 at eval   Time 8   Period Weeks   Status New     PT LONG TERM GOAL #3   Title Pt will be able to use L upper extremity in putting away and reaching for dishes in upper cabinets   Baseline limited by strength and ROM at eval   Time 8   Period Weeks   Status New     PT LONG TERM GOAL #4   Title Pt will have resolution of numbness to L hand to improve grip and manipulation ability   Baseline constant at eval   Time 8   Period Weeks   Status New               Plan - 02/25/17 1604    Clinical Impression Statement tested positive for supraspinatus impingement, decreased pain following activation of periscpaular region and stretching pectoralis. Placed ionto patch due to cont TTP following treatment.    PT Treatment/Interventions ADLs/Self Care Home Management;Cryotherapy;Electrical Stimulation;Iontophoresis 4mg /ml Dexamethasone;Functional mobility training;Ultrasound;Traction;Moist Heat;Therapeutic activities;Therapeutic exercise;Neuromuscular re-education;Patient/family education;Passive range of motion;Manual techniques;Dry needling;Taping   PT Next Visit Plan check scapular mobility, periscapular endurance   PT Home Exercise Plan putty grip, scapular retraction, GHJ flexion stretch on table; upper trap and levator stretch, IR stretch with strap, flexion stretch up wall. resisted ER, rows   Consulted and Agree with Plan of Care Patient      Patient will benefit from skilled therapeutic intervention in order to improve the following deficits and impairments:  Decreased range of motion, Impaired UE functional use, Increased muscle spasms, Decreased activity tolerance, Pain, Improper body mechanics, Impaired flexibility, Hypomobility, Decreased mobility, Decreased strength, Impaired sensation, Postural dysfunction  Visit Diagnosis: Acute pain of left shoulder  Cervicalgia  Pain in left arm  Abnormal posture     Problem List Patient Active  Problem List   Diagnosis Date Noted  . 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 Breast cancer of upper-outer quadrant of left female breast ([place]) 03/03/2012  . 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 Menopause 03/03/2012    Ardie Mclennan C. Janisha Bueso PT, DPT 02/25/17 5:12 PM   [facility] [address] [place], [place], [address] Phone: [phone]   Fax:  [phone]  Name: [facility] Willis MRN: [id number] Date of Birth: 06/16/1951

## 2017-02-27 ENCOUNTER — Encounter: Payer: Self-pay | Admitting: Physical Therapy

## 2017-02-27 ENCOUNTER — Ambulatory Visit: Payer: Medicare Other | Admitting: Physical Therapy

## 2017-02-27 DIAGNOSIS — M542 Cervicalgia: Secondary | ICD-10-CM

## 2017-02-27 DIAGNOSIS — J309 Allergic rhinitis, unspecified: Secondary | ICD-10-CM | POA: Diagnosis not present

## 2017-02-27 DIAGNOSIS — R0602 Shortness of breath: Secondary | ICD-10-CM | POA: Diagnosis not present

## 2017-02-27 DIAGNOSIS — M25512 Pain in left shoulder: Secondary | ICD-10-CM

## 2017-02-27 DIAGNOSIS — M79602 Pain in left arm: Secondary | ICD-10-CM | POA: Diagnosis not present

## 2017-02-27 DIAGNOSIS — G4710 Hypersomnia, unspecified: Secondary | ICD-10-CM | POA: Diagnosis not present

## 2017-02-27 DIAGNOSIS — R293 Abnormal posture: Secondary | ICD-10-CM | POA: Diagnosis not present

## 2017-02-27 DIAGNOSIS — G2581 Restless legs syndrome: Secondary | ICD-10-CM | POA: Diagnosis not present

## 2017-02-27 NOTE — Therapy (Signed)
[place], [place], [address] Phone: [phone]   Fax:  [phone]  Physical Therapy Treatment  Patient Details  Name: Jordan Willis MRN: [id number] Date of Birth: August 06, 1951 Referring Provider: [name], MD  Encounter Date: 02/27/2017      PT End of Session - 02/27/17 1601    Visit Number 6   Number of Visits 17   Date for PT Re-Evaluation 04/05/17   Authorization Type UHC MCR   PT Start Time 6073  pt arrived late   PT Stop Time 1627   PT Time Calculation (min) 31 min   Activity Tolerance Patient tolerated treatment well   Behavior During Therapy [facility] for tasks assessed/performed      Past Medical History:  Diagnosis Date  . Arthritis    Lumbar spine DDD.  Marland Kitchen Breast cancer, female, left 03/03/2012  . 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 Frequent sinus infections   . Hyperlipidemia   . Menopause 03/03/2012  . Nephrolithiasis   . Personal history of chemotherapy   . Personal history of radiation therapy   . Sinusitis 2014    Past Surgical History:  Procedure Laterality Date  . BILATERAL SALPINGOOPHORECTOMY Bilateral   . BREAST BIOPSY Right 2009  . BREAST LUMPECTOMY Right 2009  . BREAST LUMPECTOMY Left 2006  . BREAST SURGERY Left 2006   invasive mammary cancer/high grade ductal carcinoma; chemotherapy and radiation  . BREAST SURGERY Right 2009   followed by radiation  . COLONOSCOPY  10/22/2009   Normal.  Repeat 5 years.  Nat [name]  . TUBAL LIGATION      There were no vitals filed for this visit.      Subjective Assessment - 02/27/17 1557    Subjective pt reports shoulder is feeling better, just need a nap.    Patient Stated Goals decrease pain, reach up for dishes with L hand, lay flat on floor-both shoulders on floor, return to yoga (chair yoga), decrease numbness/swelling in hand, sleep   Currently in Pain? No/denies            [facility] PT Assessment -  02/27/17 0001      AROM   Left Shoulder Extension 34 Degrees   Left Shoulder Flexion 123 Degrees   Left Shoulder ABduction 115 Degrees     PROM   Overall PROM Comments PROM WFL pt reports tightness and pulling at end ranges     Strength   Overall Strength Comments IR/ER 5/5, flexion & abduction unable to move through avail ROM                     [facility] Adult PT Treatment/Exercise - 02/27/17 0001      Shoulder Exercises: Seated   Other Seated Exercises seated lat press     Shoulder Exercises: Prone   Retraction 15 reps   Retraction Limitations added extension   Other Prone Exercises prone retraction + extension with 5 abduction pulls green tband     Shoulder Exercises: Standing   Other Standing Exercises triceps kicks 2#     Shoulder Exercises: ROM/Strengthening   UBE (Upper Arm Bike) L1 3'/3'     Shoulder Exercises: Stretch   Other Shoulder Stretches open book 3x2 deep breaths each side                PT Education - 02/27/17 1607    Education provided Yes   Education Details exercise form/rationale, exertional  chest tightness & breathing with exercise   Person(s) Educated Patient   Methods Explanation;Demonstration;Tactile cues;Verbal cues   Comprehension Verbalized understanding;Returned demonstration;Verbal cues required;Tactile cues required;Need further instruction          PT Short Term Goals - 02/27/17 1630      PT SHORT TERM GOAL #1   Title Pt will demo 10 deg increase in AROM by 5/11   Baseline see flowsheet   Status Achieved     PT SHORT TERM GOAL #2   Title Pt will verbalize improved ability to sleep at night, using stretches, exercises and positioning to decrease pain   Baseline achieved   Status Achieved           PT Long Term Goals - 02/05/17 1518      PT LONG TERM GOAL #1   Title FOTO to 60% ability to indicate significant improvement in functional ability by 6/15   Baseline 41% ability at eval   Time 8   Period  Weeks   Status New     PT LONG TERM GOAL #2   Title Pt will report average pain <=3/10 do decrease pain effects on daily activities    Baseline 7/10 at eval   Time 8   Period Weeks   Status New     PT LONG TERM GOAL #3   Title Pt will be able to use L upper extremity in putting away and reaching for dishes in upper cabinets   Baseline limited by strength and ROM at eval   Time 8   Period Weeks   Status New     PT LONG TERM GOAL #4   Title Pt will have resolution of numbness to L hand to improve grip and manipulation ability   Baseline constant at eval   Time 8   Period Weeks   Status New               Plan - 02/27/17 1628    Clinical Impression Statement No sharp pain indicitive of impingment today. Activated lats and triceps for retraction and depression, good strength in external rotators. Pt reported fatigue with exercises and pulling in stretches. Wants to be able to lay supine with hands behind head and elbows lay on table, I told her that if she just feels anterior pulling that that position is okay. Will continue to challenge strength and endurance in overhead motions.    PT Next Visit Plan high range flexion and abduction strength/endurance   PT Home Exercise Plan putty grip, scapular retraction, GHJ flexion stretch on table; upper trap and levator stretch, IR stretch with strap, flexion stretch up wall. resisted ER, rows; open books   Consulted and Agree with Plan of Care Patient      Patient will benefit from skilled therapeutic intervention in order to improve the following deficits and impairments:     Visit Diagnosis: Acute pain of left shoulder  Cervicalgia  Pain in left arm  Abnormal posture     Problem List Patient Active Problem List   Diagnosis Date Noted  . 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 Breast cancer of upper-outer quadrant of left female breast ([place]) 03/03/2012  . DCIS (ductal carcinoma in situ) of breast,  right 03/03/2012  . Menopause 03/03/2012    Letrell Attwood C. Jago Carton PT, DPT 02/27/17 4:32 PM   [facility] [address] [place], [place], [address] Phone: [phone]   Fax:  [phone]  Name: Jordan Willis  MRN: [id number] Date of Birth: Nov 18, 1950

## 2017-02-28 DIAGNOSIS — R3915 Urgency of urination: Secondary | ICD-10-CM | POA: Diagnosis not present

## 2017-02-28 DIAGNOSIS — N8189 Other female genital prolapse: Secondary | ICD-10-CM | POA: Diagnosis not present

## 2017-02-28 DIAGNOSIS — Z124 Encounter for screening for malignant neoplasm of cervix: Secondary | ICD-10-CM | POA: Diagnosis not present

## 2017-03-04 ENCOUNTER — Ambulatory Visit: Payer: Medicare Other | Admitting: Physical Therapy

## 2017-03-04 DIAGNOSIS — R0902 Hypoxemia: Secondary | ICD-10-CM | POA: Diagnosis not present

## 2017-03-04 DIAGNOSIS — G4733 Obstructive sleep apnea (adult) (pediatric): Secondary | ICD-10-CM | POA: Diagnosis not present

## 2017-03-06 ENCOUNTER — Encounter: Payer: Self-pay | Admitting: Physical Therapy

## 2017-03-06 ENCOUNTER — Ambulatory Visit: Payer: Medicare Other | Admitting: Physical Therapy

## 2017-03-06 DIAGNOSIS — M25512 Pain in left shoulder: Secondary | ICD-10-CM

## 2017-03-06 DIAGNOSIS — R293 Abnormal posture: Secondary | ICD-10-CM

## 2017-03-06 DIAGNOSIS — M79602 Pain in left arm: Secondary | ICD-10-CM

## 2017-03-06 DIAGNOSIS — M542 Cervicalgia: Secondary | ICD-10-CM

## 2017-03-06 NOTE — Therapy (Signed)
[place], [place], [address] Phone: [phone]   Fax:  [phone]  Physical Therapy Treatment  Patient Details  Name: Jordan Willis MRN: [id number] Date of Birth: [birth year] Referring Provider: [name], MD  Encounter Date: 03/06/2017      PT End of Session - 03/06/17 1545    Visit Number 7   Number of Visits 17   Date for PT Re-Evaluation 04/05/17   Authorization Type UHC MCR   PT Start Time 4765   PT Stop Time 1627   PT Time Calculation (min) 42 min   Activity Tolerance Patient tolerated treatment well   Behavior During Therapy [facility] for tasks assessed/performed      Past Medical History:  Diagnosis Date  . Arthritis    Lumbar spine DDD.  Marland Kitchen Breast cancer, female, left 03/03/2012  . 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 Frequent sinus infections   . Hyperlipidemia   . Menopause 03/03/2012  . Nephrolithiasis   . Personal history of chemotherapy   . Personal history of radiation therapy   . Sinusitis 2014    Past Surgical History:  Procedure Laterality Date  . BILATERAL SALPINGOOPHORECTOMY Bilateral   . BREAST BIOPSY Right 2009  . BREAST LUMPECTOMY Right 2009  . BREAST LUMPECTOMY Left 2006  . BREAST SURGERY Left 2006   invasive mammary cancer/high grade ductal carcinoma; chemotherapy and radiation  . BREAST SURGERY Right 2009   followed by radiation  . COLONOSCOPY  10/22/2009   Normal.  Repeat 5 years.  Nat [name]  . TUBAL LIGATION      There were no vitals filed for this visit.      Subjective Assessment - 03/06/17 1545    Subjective pt reports shoulder is feeling pretty good. has been doing exercises at home. Denies pain over the last couple of days. had one night since last visit, she woke up laying on shoulder which was painful. is being tested for use of CPAP. Used chest stretches when walking which decreased chest tightness.    Patient Stated Goals  decrease pain, reach up for dishes with L hand, lay flat on floor-both shoulders on floor, return to yoga (chair yoga), decrease numbness/swelling in hand, sleep   Currently in Pain? No/denies            [facility] PT Assessment - 03/06/17 0001      Observation/Other Assessments   Focus on Therapeutic Outcomes (FOTO)  62%     Sensation   Additional Comments no numbness in a while     AROM   Left Shoulder Extension 37 Degrees   Left Shoulder Flexion 131 Degrees  end range pain post to [facility] joint   Left Shoulder ABduction 123 Degrees     PROM   Left Shoulder Flexion --  PROM WFL, painful above about 130 with empty end feel                     [facility] Adult PT Treatment/Exercise - 03/06/17 0001      Shoulder Exercises: Prone   Retraction Limitations retraction + extension x10     Shoulder Exercises: ROM/Strengthening   UBE (Upper Arm Bike) L1 3'/3'     Shoulder Exercises: Stretch   Other Shoulder Stretches open book 3x2 deep breaths each side     Iontophoresis   Type of Iontophoresis Dexamethasone   Location L supraspinatus, distal attach   Dose 1cc   Time  6hr wear time     Manual Therapy   Myofascial Release L pectoralis and upper trap   Passive ROM GHJ flexion & abduction                PT Education - 2017/03/15 1550    Education provided Yes   Education Details exercise form/rationale, progress toward goals, POC   Person(s) Educated Patient   Methods Explanation;Demonstration;Tactile cues;Verbal cues   Comprehension Verbalized understanding;Returned demonstration;Verbal cues required;Tactile cues required;Need further instruction          PT Short Term Goals - 02/27/17 1630      PT SHORT TERM GOAL #1   Title Pt will demo 10 deg increase in AROM by 5/11   Baseline see flowsheet   Status Achieved     PT SHORT TERM GOAL #2   Title Pt will verbalize improved ability to sleep at night, using stretches, exercises and positioning to decrease  pain   Baseline achieved   Status Achieved           PT Long Term Goals - Mar 15, 2017 1611      PT LONG TERM GOAL #1   Title FOTO to 60% ability to indicate significant improvement in functional ability by 6/15   Baseline 62% ability   Status Achieved     PT LONG TERM GOAL #2   Title Pt will report average pain <=3/10 do decrease pain effects on daily activities    Baseline 5/10   Status On-going     PT LONG TERM GOAL #3   Title Pt will be able to use L upper extremity in putting away and reaching for dishes in upper cabinets   Baseline still limited to top shelf   Status On-going     PT LONG TERM GOAL #4   Title Pt will have resolution of numbness to L hand to improve grip and manipulation ability   Baseline occasional tingling with certain movements   Status On-going               Plan - Mar 15, 2017 1633    Clinical Impression Statement Pt making significant improvements toward functional goals. cont to experience sharp pain indicitive of impingment of supraspinatus. Mid range passive pain with empty end feel. Significant tightness of L pectoralis pulling GHJ forward and protracting scapula. Will continue to benefit from strengthing and postural strengthening to decrease shoulder pain.    PT Next Visit Plan periscapular strength/endurance   PT Home Exercise Plan putty grip, scapular retraction, GHJ flexion stretch on table; upper trap and levator stretch, IR stretch with strap, flexion stretch up wall. resisted ER, rows; open books   Consulted and Agree with Plan of Care Patient      Patient will benefit from skilled therapeutic intervention in order to improve the following deficits and impairments:     Visit Diagnosis: Acute pain of left shoulder  Cervicalgia  Pain in left arm  Abnormal posture       G-Codes - 03-15-17 1635    Functional Assessment Tool Used (Outpatient Only) FOTO 62% ability (goal 60%), functional AROM, clinical judgement   Functional  Limitation Carrying, moving and handling objects   Carrying, Moving and Handling Objects Current Status (T6256) At least 40 percent but less than 60 percent impaired, limited or restricted   Carrying, Moving and Handling Objects Goal Status (L8937) At least 20 percent but less than 40 percent impaired, limited or restricted      Problem List Patient Active Problem  List   Diagnosis Date Noted  . 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 Breast cancer of upper-outer quadrant of left female breast ([place]) 03/03/2012  . 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 Menopause 03/03/2012    Galen Malkowski C. Sydnei Ohaver PT, DPT 03/06/17 4:37 PM   [facility] [address] [place], [place], [address] Phone: [phone]   Fax:  [phone]  Name: Jordan Willis MRN: [id number] Date of Birth: 04-15-1951

## 2017-03-11 ENCOUNTER — Ambulatory Visit: Payer: Medicare Other | Admitting: Physical Therapy

## 2017-03-11 ENCOUNTER — Encounter: Payer: Self-pay | Admitting: Physical Therapy

## 2017-03-11 DIAGNOSIS — M542 Cervicalgia: Secondary | ICD-10-CM

## 2017-03-11 DIAGNOSIS — R293 Abnormal posture: Secondary | ICD-10-CM

## 2017-03-11 DIAGNOSIS — M25512 Pain in left shoulder: Secondary | ICD-10-CM | POA: Diagnosis not present

## 2017-03-11 DIAGNOSIS — M79602 Pain in left arm: Secondary | ICD-10-CM | POA: Diagnosis not present

## 2017-03-11 DIAGNOSIS — E7800 Pure hypercholesterolemia, unspecified: Secondary | ICD-10-CM | POA: Diagnosis not present

## 2017-03-11 NOTE — Therapy (Addendum)
Jordan Willis, [place], [address] Phone: [phone]   Fax:  [phone]  Physical Therapy Treatment/Discharge Summary  Patient Details  Name: Jordan Willis MRN: [id number] Date of Birth: Nov 04, 1950 Referring Provider: [name], MD  Encounter Date: 03/11/2017      Willis End of Session - 03/11/17 1554    Visit Number 8   Number of Visits 17   Date for Willis Re-Evaluation 04/05/17   Authorization Type UHC MCR   Willis Start Time 1771  Willis arrived late   Willis Stop Time 1625   Willis Time Calculation (min) 32 min   Activity Tolerance Patient tolerated treatment well   Behavior During Therapy Jordan Willis for tasks assessed/performed      Past Medical History:  Diagnosis Date  . Arthritis    Lumbar spine DDD.  Marland Kitchen Breast cancer, female, left 03/03/2012  . 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 Frequent sinus infections   . Hyperlipidemia   . Menopause 03/03/2012  . Nephrolithiasis   . Personal history of chemotherapy   . Personal history of radiation therapy   . Sinusitis 2014    Past Surgical History:  Procedure Laterality Date  . BILATERAL SALPINGOOPHORECTOMY Bilateral   . BREAST BIOPSY Right 2009  . BREAST LUMPECTOMY Right 2009  . BREAST LUMPECTOMY Left 2006  . BREAST SURGERY Left 2006   invasive mammary cancer/high grade ductal carcinoma; chemotherapy and radiation  . BREAST SURGERY Right 2009   followed by radiation  . COLONOSCOPY  10/22/2009   Normal.  Repeat 5 years.  Jordan Willis  . TUBAL LIGATION      There were no vitals filed for this visit.      Subjective Assessment - 03/11/17 1555    Subjective shoulder began hurting yesterday when she was cold in the movie theatre. Overall shoulder is feeling Willis good.   Patient Stated Goals decrease pain, reach up for dishes with L hand, lay flat on floor-both shoulders on floor, return to yoga (chair yoga), decrease numbness/swelling in hand,  sleep   Currently in Pain? No/denies                         Jordan Willis Willis Adult Willis Treatment/Exercise - 03/11/17 0001      Shoulder Exercises: Supine   Flexion Limitations ER press into pilates ring, elbows 90 deg     Shoulder Exercises: Prone   Other Prone Exercises I, T both thumb up   Other Prone Exercises row to triceps kick 2#     Shoulder Exercises: Standing   External Rotation 20 reps  elbows on table   Theraband Level (Shoulder External Rotation) Level 2 (Red)   Row 20 reps   Theraband Level (Shoulder Row) Level 3 (Green)   Other Standing Exercises ER & IR green tband     Shoulder Exercises: ROM/Strengthening   UBE (Upper Arm Bike) L1 3'/3'     Manual Therapy   Joint Mobilization prone thoracic PA, gross rib ER, L scapular mobs                Willis Education - 03/11/17 1625    Education provided Yes   Education Details exercise form/rationale, HEP   Person(s) Educated Patient   Methods Explanation;Demonstration;Tactile cues;Verbal cues;Handout   Comprehension Verbalized understanding;Returned demonstration;Verbal cues required;Tactile cues required;Need further instruction          Willis Short Term Goals - 02/27/17 1630  Willis SHORT TERM GOAL #1   Title Willis will demo 10 deg increase in AROM by 5/11   Baseline see flowsheet   Status Achieved     Willis SHORT TERM GOAL #2   Title Willis will verbalize improved ability to sleep at night, using stretches, exercises and positioning to decrease pain   Baseline achieved   Status Achieved           Willis Long Term Goals - 03/06/17 1611      Willis LONG TERM GOAL #1   Title FOTO to 60% ability to indicate significant improvement in functional ability by 6/15   Baseline 62% ability   Status Achieved     Willis LONG TERM GOAL #2   Title Willis will report average pain <=3/10 do decrease pain effects on daily activities    Baseline 5/10   Status On-going     Willis LONG TERM GOAL #3   Title Willis will be able to use L  upper extremity in putting away and reaching for dishes in upper cabinets   Baseline still limited to top shelf   Status On-going     Willis LONG TERM GOAL #4   Title Willis will have resolution of numbness to L hand to improve grip and manipulation ability   Baseline occasional tingling with certain movements   Status On-going               Plan - 03/11/17 1626    Clinical Impression Statement Willis demo near equal ROM R to L without shoulder hike and denies pain at end range. Motion limited by reported tightness rather that pain or weakness. Fatigued with exercises today but was able to perform without overuse of upper trap   Willis Next Visit Plan periscapular strength/endurance   Willis Home Exercise Plan putty grip, scapular retraction, GHJ flexion stretch on table; upper trap and levator stretch, IR stretch with strap, flexion stretch up wall. resisted ER, rows; open books; row, prone Y, T, row+triceps kick;    Consulted and Agree with Plan of Care Patient      Patient will benefit from skilled therapeutic intervention in order to improve the following deficits and impairments:     Visit Diagnosis: Acute pain of left shoulder  Cervicalgia  Pain in left arm  Abnormal posture     Problem List Patient Active Problem List   Diagnosis Date Noted  . 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 Breast cancer of upper-outer quadrant of left female breast ([place]) 03/03/2012  . 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 Menopause 03/03/2012   Jordan Willis C. Jordan Willis, DPT 03/11/17 4:28 PM    Jordan Willis [address] [place], [place], [address] Phone: [phone]   Fax:  [phone]  Name: Jordan Willis MRN: [id number] Date of Birth: [birth year]  PHYSICAL THERAPY DISCHARGE SUMMARY  Visits from Start of Care: 8  Current functional level related to goals / functional outcomes: See above    Remaining deficits: See above   Education / Equipment: Anatomy of condition, POC, HEP, exercise form/rationale  Plan: Patient agrees to discharge.  Patient goals were not met. Patient is being discharged due to a change in medical status.  ?????     Jordan Willis C. Jordan Willis Willis, DPT 04/22/17 5:25 PM

## 2017-03-12 DIAGNOSIS — E7800 Pure hypercholesterolemia, unspecified: Secondary | ICD-10-CM | POA: Diagnosis not present

## 2017-03-13 ENCOUNTER — Ambulatory Visit: Payer: Medicare Other | Admitting: Physical Therapy

## 2017-03-18 DIAGNOSIS — J309 Allergic rhinitis, unspecified: Secondary | ICD-10-CM | POA: Diagnosis not present

## 2017-03-18 DIAGNOSIS — R7303 Prediabetes: Secondary | ICD-10-CM | POA: Diagnosis not present

## 2017-03-18 DIAGNOSIS — G4730 Sleep apnea, unspecified: Secondary | ICD-10-CM | POA: Diagnosis not present

## 2017-03-18 DIAGNOSIS — G2581 Restless legs syndrome: Secondary | ICD-10-CM | POA: Diagnosis not present

## 2017-03-20 DIAGNOSIS — N819 Female genital prolapse, unspecified: Secondary | ICD-10-CM | POA: Diagnosis not present

## 2017-03-20 DIAGNOSIS — E041 Nontoxic single thyroid nodule: Secondary | ICD-10-CM | POA: Diagnosis not present

## 2017-03-22 DIAGNOSIS — N8189 Other female genital prolapse: Secondary | ICD-10-CM | POA: Diagnosis not present

## 2017-03-22 DIAGNOSIS — N8110 Cystocele, unspecified: Secondary | ICD-10-CM | POA: Diagnosis not present

## 2017-03-22 DIAGNOSIS — Z01411 Encounter for gynecological examination (general) (routine) with abnormal findings: Secondary | ICD-10-CM | POA: Diagnosis not present

## 2017-03-25 ENCOUNTER — Telehealth: Payer: Self-pay | Admitting: Physical Therapy

## 2017-03-25 ENCOUNTER — Ambulatory Visit: Payer: Medicare Other | Attending: Internal Medicine | Admitting: Physical Therapy

## 2017-03-25 NOTE — Telephone Encounter (Signed)
Attempted to call pt regarding No Show for PT today. Pt name not listed on VM recording for number provided so message was not left. Jordan Willis PT, DPT 03/25/17 4:55 PM

## 2017-03-27 ENCOUNTER — Ambulatory Visit: Payer: Medicare Other | Admitting: Physical Therapy

## 2017-03-29 DIAGNOSIS — Z9013 Acquired absence of bilateral breasts and nipples: Secondary | ICD-10-CM | POA: Diagnosis not present

## 2017-04-01 ENCOUNTER — Ambulatory Visit: Payer: Medicare Other | Admitting: Physical Therapy

## 2017-04-03 ENCOUNTER — Ambulatory Visit (INDEPENDENT_AMBULATORY_CARE_PROVIDER_SITE_OTHER): Payer: Medicare Other | Admitting: Family Medicine

## 2017-04-03 ENCOUNTER — Encounter (INDEPENDENT_AMBULATORY_CARE_PROVIDER_SITE_OTHER): Payer: Self-pay

## 2017-04-03 ENCOUNTER — Ambulatory Visit: Payer: Medicare Other | Admitting: Physical Therapy

## 2017-04-04 ENCOUNTER — Telehealth: Payer: Self-pay | Admitting: Physical Therapy

## 2017-04-04 NOTE — Telephone Encounter (Signed)
Called pt to discuss no show appointments on 6/4 & 6/6, she states she did speak to receptionist to cancel these appointments. Knows she missed yesterday due to cousin passing. I told her the dates and times for her next appointments and she says she will be present.  Gay Filler Chawn Spraggins PT, DPT 04/04/17 10:45 AM

## 2017-04-08 ENCOUNTER — Ambulatory Visit: Payer: Medicare Other | Admitting: Physical Therapy

## 2017-04-10 ENCOUNTER — Ambulatory Visit: Payer: Medicare Other | Admitting: Physical Therapy

## 2017-04-12 ENCOUNTER — Other Ambulatory Visit: Payer: Self-pay | Admitting: Endocrinology

## 2017-04-12 DIAGNOSIS — E042 Nontoxic multinodular goiter: Secondary | ICD-10-CM | POA: Diagnosis not present

## 2017-04-13 DIAGNOSIS — J019 Acute sinusitis, unspecified: Secondary | ICD-10-CM | POA: Diagnosis not present

## 2017-04-15 ENCOUNTER — Ambulatory Visit: Payer: Medicare Other | Admitting: Physical Therapy

## 2017-04-17 ENCOUNTER — Encounter (INDEPENDENT_AMBULATORY_CARE_PROVIDER_SITE_OTHER): Payer: Self-pay

## 2017-04-17 ENCOUNTER — Ambulatory Visit: Payer: Medicare Other | Admitting: Physical Therapy

## 2017-04-17 ENCOUNTER — Ambulatory Visit (INDEPENDENT_AMBULATORY_CARE_PROVIDER_SITE_OTHER): Payer: Medicare Other | Admitting: Family Medicine

## 2017-04-23 ENCOUNTER — Ambulatory Visit
Admission: RE | Admit: 2017-04-23 | Discharge: 2017-04-23 | Disposition: A | Discharge status: Home / Self Care | Payer: Medicare Other | Source: Ambulatory Visit | Attending: Endocrinology | Admitting: Endocrinology

## 2017-04-23 ENCOUNTER — Other Ambulatory Visit (HOSPITAL_COMMUNITY)
Admission: RE | Admit: 2017-04-23 | Discharge: 2017-04-23 | Disposition: A | Discharge status: Home / Self Care | Payer: Medicare Other | Source: Ambulatory Visit | Attending: Radiology | Admitting: Radiology

## 2017-04-23 DIAGNOSIS — E041 Nontoxic single thyroid nodule: Secondary | ICD-10-CM | POA: Diagnosis not present

## 2017-04-23 DIAGNOSIS — E042 Nontoxic multinodular goiter: Secondary | ICD-10-CM

## 2017-04-26 ENCOUNTER — Other Ambulatory Visit: Payer: Self-pay | Admitting: Obstetrics and Gynecology

## 2017-05-07 NOTE — Patient Instructions (Addendum)
Your procedure is scheduled on:  Thursday, July 26  Enter through the Main Entrance of [facility] at:  6 am  Pick up the phone at the desk and dial [phone].  Call this number if you have problems the morning of surgery: [phone].  Remember: Do NOT eat food or drink clear liquids (including water) after Midnight Wednesday  Take these medicines the morning of surgery with a SIP OF WATER:  Allegra-D if needed  Bring proair inhaler with you on day of surgery.  Stop herbal medications and supplements at this time.  Do NOT wear jewelry (body piercing), metal hair clips/bobby pins, make-up, or nail polish. Do NOT wear lotions, powders, or perfumes.  You may wear deoderant. Do NOT shave for 48 hours prior to surgery. Do NOT bring valuables to the hospital.  Leave suitcase in car.  After surgery it may be brought to your room.  For patients admitted to the hospital, checkout time is 11:00 AM the day of discharge. Have a responsible adult drive you home and stay with you for 24 hours after your procedure.  Home with Corrie [place] cell [phone]

## 2017-05-08 NOTE — H&P (Signed)
Jordan Willis is a 66 y.o. female  P: 2-0-0-2 who is S/P bilateral salpingo-oophorectomy due to bilateral breast cancer and  presents for hysterectomy, anterior colporrhaphy and placement of tension free vaginal tape because of stress urinary incontinence, pelvic relaxation and uterine fibroids. Over the past year the patient has developed leaking of urine with any activity that increases  her intra-abdominal pressure.  She  was unable to complete urodynamic testing (Lumax) due to interference from her cystocele and uterine fibroids.  She denes any dysuria, flank pain, changes in bowel function, dyspareuna or  urge incontinence symptoms however, she has and asymptomatic renal stone  had an overactive bladder.  Though her overactive bladder symptoms respond well to Vesicare,  at night she has no urge ot urinate with this medication and that causes her pelvic pain due to an overfilled bladder-no problems with this during the day.  A pelvic ultrasound in June 2018 revealed: a  retroverted uterus: 8.47 x 8.91 x 7.81 cm, endometrium obscured by diffuse fibroid involvement; #7 fibroids: right lateral subserosal-4.04 cm; 4.17 cm right fundal subserosal; 2.72 cm anterior right subserosal; 2.97 cm left inferior anterior intramural with calcifications; 3.33 cm anterior inferior intramural and 1.99 cm posterior fundal subserosal; Both ovaries are surgically absent. A review of both medical and surgical management options were given to the patient however,  she has chosen to proceed with hysterectomy, anterior colporrhaphy and placementof tension free vaginal tape.   Past Medical History  OB History: G:2  P: 2-0-0-2; SVB 1974 and 1977  (largest infant 9 lbs. 11.4 oz.  GYN History: menarche: 66 YO;    LMP: post menopausal;    The patient denies history of sexually transmitted disease.  Denies history of abnormal PAP smear.  Last PAP smear: 2018-normal  Medical History: Renal Stone, Hyperlipidemia, Bilateral Breast Cancer  (S/P Chemotherapy and Radiation),  BRCA-2 Positive; Degenerative Disc Disease, Left Shoulder Adhesive Capsulitis and Post Traumatic Migraine  Surgical History: 1977 Tubal Sterilization; 2006 Left Lumpectomy; 2007  Left Shoulder Arthroscopy;   2009  Right Lumpectomy and  2009 Bilateral Salpingo-oophorectomy  Denies problems with anesthesia or history of blood transfusions  Family History: Breast Cancer, Hypertension, Diabetes Mellitus and Arthritis  Social History: Divorced and Retired;  Denies tobacco or alcohol use  Medications: Vesicare 5 mg daily Zolpidem 5 mg qhs prn ProAir HFA 90 mcg/actuation 2 puffs every 4 hours prn Atorvastatin 10 mg daily Azelastine 137 mcg (0.1%) Nasal Spray prn Allergra-D  60/120 mg Mucinex 1200 mg every 12 hours as directed   Allergies  Allergen Reactions  . Asa [Aspirin] Palpitations    Heart fluttering     Denies sensitivity to peanuts, shellfish, soy, latex or adhesives.   ROS: Admits to reading glasses, urinary frequency, chronic nasal congestion but  denies headache, vision changes, dysphagia, tinnitus, dizziness, hoarseness, cough,  chest pain, shortness of breath, nausea, vomiting, diarrhea,constipation,  urgency  dysuria, hematuria, vaginitis symptoms, pelvic pain, swelling of joints,easy bruising,  myalgias, arthralgias, skin rashes, unexplained weight loss and except as is mentioned in the history of present illness, patient's review of systems is otherwise negative.   Physical Exam  Bp:  120/58         P: 80 bpm      R: 20          Temperature:  98.3 degrees F orally       Weight:  198 lbs.     Height: 5' 8"       BMI: 30.1  Neck: supple without masses or thyromegaly Lungs: clear to auscultation Heart: regular rate and rhythm Abdomen: soft, non-tender and no organomegaly Pelvic:Patient refused exam Extremities:  no clubbing, cyanosis or edema   Assesment: Cystocele                      Uterine Fibroids                      Pelvic  Relaxation                      S/P Bilateral Breast Cancer   Disposition: Disposition:  A discussion was held with patient regarding the indication for her procedure(s) along with the risks, which include but are not limited to: reaction to anesthesia, damage to adjacent organs, infection and excessive bleeding,  possible need for an open abdominal incision, possible worsening of urinary symptoms and possible erosion of tension free vaginal tape.   Patient also understands that the robotic approach to her surgery requires more time than that of an open abdominal incision, that she will experience transient post operative facial edema, that her hospital stay is expected to be 1-2 days and return to regular activities in 2-3 weeks (with the exception of intercourse which will be 6 weeks).  The patient was given the Miralax bowel prep to be completed 24 hours prior to her surgery.  The patient verbalized understanding of these risks and pre-operative instructions and has consented to proceed with a Robot Assisted Laparoscopic Hysterectomy, Anterior Colporrhaphy, Placement of Tension Free Vaginal Tape and Cystoscopy at [place] on May 16, 2017.    CSN# [id number]   Chalet Kerwin J. Florene Glen, PA-C  for Dr. Dede Query. Rivard

## 2017-05-09 ENCOUNTER — Encounter (HOSPITAL_COMMUNITY)
Admission: RE | Admit: 2017-05-09 | Discharge: 2017-05-09 | Disposition: A | Discharge status: Home / Self Care | Payer: Medicare Other | Source: Ambulatory Visit | Attending: Obstetrics and Gynecology | Admitting: Obstetrics and Gynecology

## 2017-05-09 ENCOUNTER — Other Ambulatory Visit: Payer: Self-pay

## 2017-05-09 ENCOUNTER — Encounter (HOSPITAL_COMMUNITY): Payer: Self-pay

## 2017-05-09 DIAGNOSIS — Z01812 Encounter for preprocedural laboratory examination: Secondary | ICD-10-CM | POA: Diagnosis not present

## 2017-05-09 DIAGNOSIS — Z01810 Encounter for preprocedural cardiovascular examination: Secondary | ICD-10-CM | POA: Insufficient documentation

## 2017-05-09 DIAGNOSIS — D259 Leiomyoma of uterus, unspecified: Secondary | ICD-10-CM | POA: Diagnosis not present

## 2017-05-09 DIAGNOSIS — R32 Unspecified urinary incontinence: Secondary | ICD-10-CM | POA: Diagnosis not present

## 2017-05-09 HISTORY — DX: Dyspnea, unspecified: R06.00

## 2017-05-09 HISTORY — DX: Personal history of urinary calculi: Z87.442

## 2017-05-09 LAB — BASIC METABOLIC PANEL
Anion gap: 7 (ref 5–15)
BUN: 11 mg/dL (ref 6–20)
CHLORIDE: 103 mmol/L (ref 101–111)
CO2: 27 mmol/L (ref 22–32)
CREATININE: 0.51 mg/dL (ref 0.44–1.00)
Calcium: 9.5 mg/dL (ref 8.9–10.3)
GFR calc Af Amer: >60 mL/min (ref >60)
GFR calc non Af Amer: >60 mL/min (ref >60)
GLUCOSE: 135 mg/dL — AB (ref 65–99)
Potassium: 3.5 mmol/L (ref 3.5–5.1)
Sodium: 137 mmol/L (ref 135–145)

## 2017-05-09 LAB — CBC
HEMATOCRIT: 40.6 % (ref 36.0–46.0)
Hemoglobin: 13.4 g/dL (ref 12.0–15.0)
MCH: 29.3 pg (ref 26.0–34.0)
MCHC: 33 g/dL (ref 30.0–36.0)
MCV: 88.6 fL (ref 78.0–100.0)
PLATELETS: 277 10*3/uL (ref 150–400)
RBC: 4.58 MIL/uL (ref 3.87–5.11)
RDW: 12.8 % (ref 11.5–15.5)
WBC: 5.4 10*3/uL (ref 4.0–10.5)

## 2017-05-16 ENCOUNTER — Encounter (HOSPITAL_COMMUNITY): Payer: Self-pay

## 2017-05-16 ENCOUNTER — Encounter (HOSPITAL_COMMUNITY)
Admission: RE | Disposition: A | Discharge status: Home / Self Care | Payer: Self-pay | Source: Ambulatory Visit | Attending: Obstetrics and Gynecology

## 2017-05-16 ENCOUNTER — Observation Stay (HOSPITAL_COMMUNITY)
Admission: RE | Admit: 2017-05-16 | Discharge: 2017-05-17 | Disposition: A | Discharge status: Home / Self Care | Payer: Medicare Other | Source: Ambulatory Visit | Attending: Obstetrics and Gynecology | Admitting: Obstetrics and Gynecology

## 2017-05-16 ENCOUNTER — Ambulatory Visit (HOSPITAL_COMMUNITY): Payer: Medicare Other | Admitting: Anesthesiology

## 2017-05-16 DIAGNOSIS — Z8261 Family history of arthritis: Secondary | ICD-10-CM | POA: Insufficient documentation

## 2017-05-16 DIAGNOSIS — N819 Female genital prolapse, unspecified: Secondary | ICD-10-CM | POA: Diagnosis present

## 2017-05-16 DIAGNOSIS — N736 Female pelvic peritoneal adhesions (postinfective): Secondary | ICD-10-CM | POA: Insufficient documentation

## 2017-05-16 DIAGNOSIS — M5136 Other intervertebral disc degeneration, lumbar region: Secondary | ICD-10-CM | POA: Diagnosis not present

## 2017-05-16 DIAGNOSIS — Q513 Bicornate uterus: Secondary | ICD-10-CM | POA: Insufficient documentation

## 2017-05-16 DIAGNOSIS — Z87442 Personal history of urinary calculi: Secondary | ICD-10-CM | POA: Diagnosis not present

## 2017-05-16 DIAGNOSIS — Z886 Allergy status to analgesic agent status: Secondary | ICD-10-CM | POA: Diagnosis not present

## 2017-05-16 DIAGNOSIS — Z833 Family history of diabetes mellitus: Secondary | ICD-10-CM | POA: Insufficient documentation

## 2017-05-16 DIAGNOSIS — Z79899 Other long term (current) drug therapy: Secondary | ICD-10-CM | POA: Diagnosis not present

## 2017-05-16 DIAGNOSIS — N8189 Other female genital prolapse: Secondary | ICD-10-CM | POA: Insufficient documentation

## 2017-05-16 DIAGNOSIS — Z9889 Other specified postprocedural states: Secondary | ICD-10-CM | POA: Insufficient documentation

## 2017-05-16 DIAGNOSIS — Z803 Family history of malignant neoplasm of breast: Secondary | ICD-10-CM | POA: Insufficient documentation

## 2017-05-16 DIAGNOSIS — Z853 Personal history of malignant neoplasm of breast: Secondary | ICD-10-CM | POA: Insufficient documentation

## 2017-05-16 DIAGNOSIS — Z923 Personal history of irradiation: Secondary | ICD-10-CM | POA: Diagnosis not present

## 2017-05-16 DIAGNOSIS — Z7951 Long term (current) use of inhaled steroids: Secondary | ICD-10-CM | POA: Insufficient documentation

## 2017-05-16 DIAGNOSIS — Z9221 Personal history of antineoplastic chemotherapy: Secondary | ICD-10-CM | POA: Diagnosis not present

## 2017-05-16 DIAGNOSIS — Z8249 Family history of ischemic heart disease and other diseases of the circulatory system: Secondary | ICD-10-CM | POA: Insufficient documentation

## 2017-05-16 DIAGNOSIS — C50411 Malignant neoplasm of upper-outer quadrant of right female breast: Secondary | ICD-10-CM | POA: Diagnosis not present

## 2017-05-16 DIAGNOSIS — D259 Leiomyoma of uterus, unspecified: Principal | ICD-10-CM | POA: Insufficient documentation

## 2017-05-16 DIAGNOSIS — E785 Hyperlipidemia, unspecified: Secondary | ICD-10-CM | POA: Diagnosis not present

## 2017-05-16 HISTORY — PX: BLADDER SUSPENSION: SHX72

## 2017-05-16 HISTORY — PX: CYSTOSCOPY: SHX5120

## 2017-05-16 HISTORY — PX: ROBOTIC ASSISTED TOTAL HYSTERECTOMY: SHX6085

## 2017-05-16 SURGERY — ROBOTIC ASSISTED TOTAL HYSTERECTOMY
Anesthesia: General | Site: Abdomen

## 2017-05-16 MED ORDER — ATORVASTATIN CALCIUM 10 MG PO TABS
10.0000 mg | ORAL_TABLET | Freq: Every day | ORAL | Status: DC
  Filled 2017-05-16 (×2): qty 1

## 2017-05-16 MED ORDER — FENTANYL CITRATE (PF) 250 MCG/5ML IJ SOLN
INTRAMUSCULAR | Status: AC
  Filled 2017-05-16: qty 5

## 2017-05-16 MED ORDER — ONDANSETRON HCL 4 MG PO TABS: 4.0000 mg | ORAL_TABLET | Freq: Four times a day (QID) | ORAL | Status: DC | PRN

## 2017-05-16 MED ORDER — ESTRADIOL 0.1 MG/GM VA CREA
TOPICAL_CREAM | VAGINAL | Status: AC
  Filled 2017-05-16: qty 42.5

## 2017-05-16 MED ORDER — SCOPOLAMINE 1 MG/3DAYS TD PT72
MEDICATED_PATCH | TRANSDERMAL | Status: AC
  Administered 2017-05-16: 1.5 mg via TRANSDERMAL
  Filled 2017-05-16: qty 1

## 2017-05-16 MED ORDER — ONDANSETRON HCL 4 MG/2ML IJ SOLN
INTRAMUSCULAR | Status: AC
Start: 2017-05-16 — End: ?
  Filled 2017-05-16: qty 2

## 2017-05-16 MED ORDER — SODIUM CHLORIDE 0.9 % IR SOLN
Status: DC | PRN
  Administered 2017-05-16: 3000 mL

## 2017-05-16 MED ORDER — PROMETHAZINE HCL 25 MG/ML IJ SOLN: 6.2500 mg | INTRAMUSCULAR | Status: DC | PRN

## 2017-05-16 MED ORDER — PROPOFOL 10 MG/ML IV BOLUS
INTRAVENOUS | Status: DC | PRN
  Administered 2017-05-16: 170 mg via INTRAVENOUS

## 2017-05-16 MED ORDER — OXYCODONE HCL 5 MG/5ML PO SOLN: 5.0000 mg | Freq: Once | ORAL | Status: DC | PRN

## 2017-05-16 MED ORDER — VASOPRESSIN 20 UNIT/ML IV SOLN
INTRAVENOUS | Status: AC
  Filled 2017-05-16: qty 1

## 2017-05-16 MED ORDER — LIDOCAINE HCL (CARDIAC) 20 MG/ML IV SOLN
INTRAVENOUS | Status: DC | PRN
  Administered 2017-05-16: 50 mg via INTRAVENOUS

## 2017-05-16 MED ORDER — ROPIVACAINE HCL 5 MG/ML IJ SOLN
INTRAMUSCULAR | Status: AC
  Filled 2017-05-16: qty 60

## 2017-05-16 MED ORDER — ONDANSETRON HCL 4 MG/2ML IJ SOLN
INTRAMUSCULAR | Status: DC | PRN
  Administered 2017-05-16: 4 mg via INTRAVENOUS

## 2017-05-16 MED ORDER — CEFOTETAN DISODIUM-DEXTROSE 2-2.08 GM-% IV SOLR
INTRAVENOUS | Status: AC
  Filled 2017-05-16: qty 50

## 2017-05-16 MED ORDER — CEFOTETAN DISODIUM-DEXTROSE 2-2.08 GM-% IV SOLR
2.0000 g | INTRAVENOUS | Status: AC
  Administered 2017-05-16 (×2): 2 g via INTRAVENOUS

## 2017-05-16 MED ORDER — OXYCODONE HCL 5 MG PO TABS: 5.0000 mg | ORAL_TABLET | Freq: Once | ORAL | Status: DC | PRN

## 2017-05-16 MED ORDER — ESTRADIOL 0.1 MG/GM VA CREA
TOPICAL_CREAM | VAGINAL | Status: DC | PRN
Start: 2017-05-16 — End: 2017-05-16
  Administered 2017-05-16: 1 via VAGINAL

## 2017-05-16 MED ORDER — LACTATED RINGERS IV SOLN
INTRAVENOUS | Status: DC
  Administered 2017-05-16 (×3): via INTRAVENOUS

## 2017-05-16 MED ORDER — ARTIFICIAL TEARS OPHTHALMIC OINT
TOPICAL_OINTMENT | OPHTHALMIC | Status: AC
  Filled 2017-05-16: qty 3.5

## 2017-05-16 MED ORDER — SCOPOLAMINE 1 MG/3DAYS TD PT72
1.0000 | MEDICATED_PATCH | Freq: Once | TRANSDERMAL | Status: DC
  Administered 2017-05-16: 1.5 mg via TRANSDERMAL

## 2017-05-16 MED ORDER — MENTHOL 3 MG MT LOZG: 1.0000 | LOZENGE | OROMUCOSAL | Status: DC | PRN

## 2017-05-16 MED ORDER — DEXTROSE IN LACTATED RINGERS 5 % IV SOLN
INTRAVENOUS | Status: DC
  Administered 2017-05-16: 18:00:00 via INTRAVENOUS

## 2017-05-16 MED ORDER — LIDOCAINE HCL (PF) 1 % IJ SOLN
INTRAMUSCULAR | Status: AC
  Filled 2017-05-16: qty 5

## 2017-05-16 MED ORDER — ENOXAPARIN SODIUM 40 MG/0.4ML ~~LOC~~ SOLN
40.0000 mg | SUBCUTANEOUS | Status: DC
  Administered 2017-05-17: 40 mg via SUBCUTANEOUS
  Filled 2017-05-16 (×2): qty 0.4

## 2017-05-16 MED ORDER — DEXTROSE 5 % IV SOLN
2.0000 g | Freq: Two times a day (BID) | INTRAVENOUS | Status: DC
  Filled 2017-05-16: qty 2

## 2017-05-16 MED ORDER — SUGAMMADEX SODIUM 200 MG/2ML IV SOLN
INTRAVENOUS | Status: DC | PRN
  Administered 2017-05-16: 200 mg via INTRAVENOUS

## 2017-05-16 MED ORDER — ROCURONIUM BROMIDE 100 MG/10ML IV SOLN
INTRAVENOUS | Status: DC | PRN
  Administered 2017-05-16 (×3): 10 mg via INTRAVENOUS
  Administered 2017-05-16: 50 mg via INTRAVENOUS
  Administered 2017-05-16 (×2): 10 mg via INTRAVENOUS

## 2017-05-16 MED ORDER — SODIUM CHLORIDE 0.9 % IJ SOLN
INTRAMUSCULAR | Status: AC
  Filled 2017-05-16: qty 50

## 2017-05-16 MED ORDER — VASOPRESSIN 20 UNIT/ML IV SOLN
INTRAVENOUS | Status: DC | PRN
  Administered 2017-05-16: 20 mL via INTRAMUSCULAR

## 2017-05-16 MED ORDER — ALBUTEROL SULFATE (2.5 MG/3ML) 0.083% IN NEBU: 3.0000 mL | INHALATION_SOLUTION | Freq: Four times a day (QID) | RESPIRATORY_TRACT | Status: DC | PRN

## 2017-05-16 MED ORDER — LORATADINE 10 MG PO TABS
10.0000 mg | ORAL_TABLET | Freq: Every day | ORAL | Status: DC
  Administered 2017-05-17: 10 mg via ORAL
  Filled 2017-05-16 (×2): qty 1

## 2017-05-16 MED ORDER — PROPOFOL 10 MG/ML IV BOLUS
INTRAVENOUS | Status: AC
  Filled 2017-05-16: qty 20

## 2017-05-16 MED ORDER — KETOROLAC TROMETHAMINE 30 MG/ML IJ SOLN
INTRAMUSCULAR | Status: DC | PRN
  Administered 2017-05-16: 30 mg via INTRAVENOUS

## 2017-05-16 MED ORDER — ARTIFICIAL TEARS OPHTHALMIC OINT
TOPICAL_OINTMENT | OPHTHALMIC | Status: DC | PRN
  Administered 2017-05-16: 1 via OPHTHALMIC

## 2017-05-16 MED ORDER — DEXAMETHASONE SODIUM PHOSPHATE 4 MG/ML IJ SOLN
INTRAMUSCULAR | Status: AC
  Filled 2017-05-16: qty 1

## 2017-05-16 MED ORDER — HYDROMORPHONE HCL 1 MG/ML IJ SOLN
INTRAMUSCULAR | Status: AC
  Filled 2017-05-16: qty 0.5

## 2017-05-16 MED ORDER — IBUPROFEN 600 MG PO TABS
600.0000 mg | ORAL_TABLET | Freq: Four times a day (QID) | ORAL | Status: DC | PRN
  Administered 2017-05-16 – 2017-05-17 (×2): 600 mg via ORAL
  Filled 2017-05-16 (×2): qty 1

## 2017-05-16 MED ORDER — ROCURONIUM BROMIDE 100 MG/10ML IV SOLN
INTRAVENOUS | Status: AC
  Filled 2017-05-16: qty 1

## 2017-05-16 MED ORDER — INDIGOTINDISULFONATE SODIUM 8 MG/ML IJ SOLN
INTRAMUSCULAR | Status: DC | PRN
  Administered 2017-05-16: 40 mg via INTRAVENOUS

## 2017-05-16 MED ORDER — DEXAMETHASONE SODIUM PHOSPHATE 10 MG/ML IJ SOLN
INTRAMUSCULAR | Status: DC | PRN
  Administered 2017-05-16: 10 mg via INTRAVENOUS

## 2017-05-16 MED ORDER — OXYCODONE-ACETAMINOPHEN 5-325 MG PO TABS
1.0000 | ORAL_TABLET | ORAL | Status: DC | PRN
  Administered 2017-05-16 – 2017-05-17 (×3): 1 via ORAL
  Administered 2017-05-17: 2 via ORAL
  Administered 2017-05-17: 1 via ORAL
  Filled 2017-05-16 (×3): qty 1
  Filled 2017-05-16: qty 2
  Filled 2017-05-16: qty 1

## 2017-05-16 MED ORDER — HYDROMORPHONE HCL 1 MG/ML IJ SOLN
INTRAMUSCULAR | Status: AC
  Filled 2017-05-16: qty 1

## 2017-05-16 MED ORDER — SIMETHICONE 80 MG PO CHEW: 80.0000 mg | CHEWABLE_TABLET | Freq: Four times a day (QID) | ORAL | Status: DC | PRN

## 2017-05-16 MED ORDER — KETOROLAC TROMETHAMINE 30 MG/ML IJ SOLN
INTRAMUSCULAR | Status: AC
  Filled 2017-05-16: qty 1

## 2017-05-16 MED ORDER — VASOPRESSIN 20 UNIT/ML IV SOLN
INTRAVENOUS | Status: DC | PRN
  Administered 2017-05-16: 10 mL via INTRAMUSCULAR

## 2017-05-16 MED ORDER — MIDAZOLAM HCL 2 MG/2ML IJ SOLN
INTRAMUSCULAR | Status: DC | PRN
  Administered 2017-05-16: 2 mg via INTRAVENOUS

## 2017-05-16 MED ORDER — METHYLENE BLUE 0.5 % INJ SOLN
INTRAVENOUS | Status: AC
  Filled 2017-05-16: qty 10

## 2017-05-16 MED ORDER — LIDOCAINE-EPINEPHRINE (PF) 1 %-1:200000 IJ SOLN
INTRAMUSCULAR | Status: AC
  Filled 2017-05-16: qty 30

## 2017-05-16 MED ORDER — FENTANYL CITRATE (PF) 100 MCG/2ML IJ SOLN
INTRAMUSCULAR | Status: DC | PRN
  Administered 2017-05-16 (×5): 50 ug via INTRAVENOUS

## 2017-05-16 MED ORDER — SODIUM CHLORIDE 0.9 % IV SOLN
INTRAVENOUS | Status: DC | PRN
  Administered 2017-05-16: 110 mL

## 2017-05-16 MED ORDER — SODIUM CHLORIDE 0.9 % IJ SOLN
INTRAMUSCULAR | Status: AC
  Filled 2017-05-16: qty 100

## 2017-05-16 MED ORDER — FLUTICASONE PROPIONATE 50 MCG/ACT NA SUSP
1.0000 | Freq: Every day | NASAL | Status: DC | PRN
  Filled 2017-05-16: qty 16

## 2017-05-16 MED ORDER — MIDAZOLAM HCL 2 MG/2ML IJ SOLN
INTRAMUSCULAR | Status: AC
  Filled 2017-05-16: qty 2

## 2017-05-16 MED ORDER — SODIUM CHLORIDE 0.9 % IJ SOLN
INTRAMUSCULAR | Status: AC
  Filled 2017-05-16: qty 10

## 2017-05-16 MED ORDER — SUGAMMADEX SODIUM 200 MG/2ML IV SOLN
INTRAVENOUS | Status: AC
  Filled 2017-05-16: qty 2

## 2017-05-16 MED ORDER — STERILE WATER FOR IRRIGATION IR SOLN
Status: DC | PRN
  Administered 2017-05-16: 1000 mL via INTRAVESICAL

## 2017-05-16 MED ORDER — HYDROMORPHONE HCL 1 MG/ML IJ SOLN
INTRAMUSCULAR | Status: DC | PRN
  Administered 2017-05-16 (×2): 0.5 mg via INTRAVENOUS

## 2017-05-16 MED ORDER — ONDANSETRON HCL 4 MG/2ML IJ SOLN: 4.0000 mg | Freq: Four times a day (QID) | INTRAMUSCULAR | Status: DC | PRN

## 2017-05-16 MED ORDER — HYDROMORPHONE HCL 1 MG/ML IJ SOLN
0.2500 mg | INTRAMUSCULAR | Status: DC | PRN
  Administered 2017-05-16 (×3): 0.5 mg via INTRAVENOUS

## 2017-05-16 SURGICAL SUPPLY — 91 items
BARRIER ADHS 3X4 INTERCEED (GAUZE/BANDAGES/DRESSINGS) IMPLANT
BLADE SURG 11 STRL SS (BLADE) ×3 IMPLANT
BLADE SURG 15 STRL LF C SS BP (BLADE) ×2 IMPLANT
BLADE SURG 15 STRL SS (BLADE) ×1
CANISTER SUCT 3000ML PPV (MISCELLANEOUS) ×3 IMPLANT
CATH FOLEY 2WAY SLVR  5CC 16FR (CATHETERS) ×1
CATH FOLEY 2WAY SLVR  5CC 18FR (CATHETERS) ×1
CATH FOLEY 2WAY SLVR 5CC 16FR (CATHETERS) ×2 IMPLANT
CATH FOLEY 2WAY SLVR 5CC 18FR (CATHETERS) ×2 IMPLANT
CATH FOLEY 3WAY  5CC 16FR (CATHETERS) ×1
CATH FOLEY 3WAY 5CC 16FR (CATHETERS) ×2 IMPLANT
CLOTH BEACON ORANGE TIMEOUT ST (SAFETY) ×3 IMPLANT
CONT PATH 16OZ SNAP LID 3702 (MISCELLANEOUS) ×3 IMPLANT
COVER BACK TABLE 60X90IN (DRAPES) ×6 IMPLANT
COVER TIP SHEARS 8 DVNC (MISCELLANEOUS) ×2 IMPLANT
COVER TIP SHEARS 8MM DA VINCI (MISCELLANEOUS) ×1
DECANTER SPIKE VIAL GLASS SM (MISCELLANEOUS) ×18 IMPLANT
DEFOGGER SCOPE WARMER CLEARIFY (MISCELLANEOUS) ×3 IMPLANT
DERMABOND ADVANCED (GAUZE/BANDAGES/DRESSINGS) ×2
DERMABOND ADVANCED .7 DNX12 (GAUZE/BANDAGES/DRESSINGS) ×4 IMPLANT
DILATOR CANAL MILEX (MISCELLANEOUS) ×3 IMPLANT
DURAPREP 26ML APPLICATOR (WOUND CARE) ×3 IMPLANT
ELECT REM PT RETURN 9FT ADLT (ELECTROSURGICAL) ×3
ELECTRODE REM PT RTRN 9FT ADLT (ELECTROSURGICAL) ×2 IMPLANT
GAUZE PACKING 1 X5 YD ST (GAUZE/BANDAGES/DRESSINGS) ×3 IMPLANT
GAUZE PACKING 2X5 YD STRL (GAUZE/BANDAGES/DRESSINGS) IMPLANT
GAUZE VASELINE 3X9 (GAUZE/BANDAGES/DRESSINGS) IMPLANT
GLOVE BIO SURGEON STRL SZ7 (GLOVE) ×3 IMPLANT
GLOVE BIO SURGEON STRL SZ7.5 (GLOVE) ×3 IMPLANT
GLOVE BIOGEL PI IND STRL 7.0 (GLOVE) ×12 IMPLANT
GLOVE BIOGEL PI IND STRL 7.5 (GLOVE) ×4 IMPLANT
GLOVE BIOGEL PI INDICATOR 7.0 (GLOVE) ×6
GLOVE BIOGEL PI INDICATOR 7.5 (GLOVE) ×2
GLOVE ECLIPSE 6.5 STRL STRAW (GLOVE) ×9 IMPLANT
GOWN STRL REUS W/TWL LRG LVL3 (GOWN DISPOSABLE) ×18 IMPLANT
KIT ACCESSORY DA VINCI DISP (KITS) ×1
KIT ACCESSORY DVNC DISP (KITS) ×2 IMPLANT
LEGGING LITHOTOMY PAIR STRL (DRAPES) ×3 IMPLANT
NEEDLE HYPO 22GX1.5 SAFETY (NEEDLE) IMPLANT
NEEDLE SPNL 22GX3.5 QUINCKE BK (NEEDLE) IMPLANT
NS IRRIG 1000ML POUR BTL (IV SOLUTION) ×3 IMPLANT
OCCLUDER COLPOPNEUMO (BALLOONS) ×3 IMPLANT
PACK ROBOT WH (CUSTOM PROCEDURE TRAY) ×3 IMPLANT
PACK ROBOTIC GOWN (GOWN DISPOSABLE) ×3 IMPLANT
PACK TRENDGUARD 450 HYBRID PRO (MISCELLANEOUS) ×2 IMPLANT
PACK TRENDGUARD 600 HYBRD PROC (MISCELLANEOUS) IMPLANT
PACK VAGINAL WOMENS (CUSTOM PROCEDURE TRAY) IMPLANT
PAD PREP 24X48 CUFFED NSTRL (MISCELLANEOUS) ×3 IMPLANT
PORT ACCESS TROCAR AIRSEAL 12 (TROCAR) IMPLANT
PORT ACCESS TROCAR AIRSEAL 5M (TROCAR)
POUCH LAPAROSCOPIC INSTRUMENT (MISCELLANEOUS) ×3 IMPLANT
PROTECTOR NERVE ULNAR (MISCELLANEOUS) ×9 IMPLANT
SET CYSTO W/LG BORE CLAMP LF (SET/KITS/TRAYS/PACK) ×3 IMPLANT
SET IRRIG TUBING LAPAROSCOPIC (IRRIGATION / IRRIGATOR) ×3 IMPLANT
SET TRI-LUMEN FLTR TB AIRSEAL (TUBING) ×3 IMPLANT
SLING TRANS VAGINAL TAPE (Sling) ×1 IMPLANT
SLING UTERINE/ABD GYNECARE TVT (Sling) ×2 IMPLANT
SUT DVC VLOC 180 0 12IN GS21 (SUTURE) ×3
SUT MNCRL AB 3-0 PS2 27 (SUTURE) ×6 IMPLANT
SUT MNCRL AB 4-0 PS2 18 (SUTURE) IMPLANT
SUT SILK 2 0 FSL 18 (SUTURE) IMPLANT
SUT VIC AB 0 CT1 27 (SUTURE) ×3
SUT VIC AB 0 CT1 27XBRD ANBCTR (SUTURE) ×6 IMPLANT
SUT VIC AB 2-0 CT2 27 (SUTURE) IMPLANT
SUT VIC AB 2-0 SH 27 (SUTURE) ×4
SUT VIC AB 2-0 SH 27XBRD (SUTURE) ×8 IMPLANT
SUT VIC AB 3-0 CT1 27 (SUTURE)
SUT VIC AB 3-0 CT1 TAPERPNT 27 (SUTURE) IMPLANT
SUT VIC AB 3-0 SH 27 (SUTURE)
SUT VIC AB 3-0 SH 27X BRD (SUTURE) IMPLANT
SUT VICRYL 0 UR6 27IN ABS (SUTURE) ×6 IMPLANT
SUT VLOC 180 0 9IN  GS21 (SUTURE) ×1
SUT VLOC 180 0 9IN GS21 (SUTURE) ×2 IMPLANT
SUTURE DVC VLC 180 0 12IN GS21 (SUTURE) ×2 IMPLANT
SYR 50ML LL SCALE MARK (SYRINGE) ×3 IMPLANT
SYR CONTROL 10ML LL (SYRINGE) ×9 IMPLANT
SYSTEM CONVERTIBLE TROCAR (TROCAR) ×3 IMPLANT
TIP RUMI ORANGE 6.7MMX12CM (TIP) IMPLANT
TIP UTERINE 5.1X6CM LAV DISP (MISCELLANEOUS) IMPLANT
TIP UTERINE 6.7X10CM GRN DISP (MISCELLANEOUS) IMPLANT
TIP UTERINE 6.7X6CM WHT DISP (MISCELLANEOUS) IMPLANT
TIP UTERINE 6.7X8CM BLUE DISP (MISCELLANEOUS) ×3 IMPLANT
TOWEL OR 17X24 6PK STRL BLUE (TOWEL DISPOSABLE) ×12 IMPLANT
TRAY FOLEY CATH SILVER 14FR (SET/KITS/TRAYS/PACK) ×3 IMPLANT
TRENDGUARD 450 HYBRID PRO PACK (MISCELLANEOUS) ×3
TRENDGUARD 600 HYBRID PROC PK (MISCELLANEOUS)
TROCAR 12M 150ML BLUNT (TROCAR) ×3 IMPLANT
TROCAR DISP BLADELESS 8 DVNC (TROCAR) ×2 IMPLANT
TROCAR DISP BLADELESS 8MM (TROCAR) ×1
TROCAR PORT AIRSEAL 5X120 (TROCAR) ×3 IMPLANT
WATER STERILE IRR 1000ML POUR (IV SOLUTION) ×3 IMPLANT

## 2017-05-16 NOTE — Interval H&P Note (Signed)
History and Physical Interval Note:  05/16/2017 7:03 AM  Jordan Willis  has presented today for surgery, with the diagnosis of Uterine Fibroids with Pelvic Prolapse - Total Time 4 hours  The various methods of treatment have been discussed with the patient and family. After consideration of risks, benefits and other options for treatment, the patient has consented to  Procedure(s): ROBOTIC ASSISTED TOTAL HYSTERECTOMY (N/A) ANTERIOR REPAIR (CYSTOCELE) (N/A) TRANSVAGINAL TAPE (TVT) PROCEDURE (N/A) CYSTOSCOPY (N/A) as a surgical intervention .  The patient's history has been reviewed, patient examined, no change in status, stable for surgery.  I have reviewed the patient's chart and labs.  Questions were answered to the patient's satisfaction.     Farran Amsden A

## 2017-05-16 NOTE — Transfer of Care (Signed)
Immediate Anesthesia Transfer of Care Note  Patient: Jordan Willis  Procedure(s) Performed: Procedure(s): ROBOTIC ASSISTED TOTAL HYSTERECTOMY (N/A) TRANSVAGINAL TAPE (TVT) PROCEDURE (N/A) CYSTOSCOPY (N/A)  Patient Location: PACU  Anesthesia Type:General  Level of Consciousness: awake, oriented and sedated  Airway & Oxygen Therapy: Patient Spontanous Breathing and Patient connected to nasal cannula oxygen  Post-op Assessment: Report given to RN and Post -op Vital signs reviewed and stable  Post vital signs: Reviewed and stable  Last Vitals:  Vitals:   05/16/17 0609  BP: 129/86  Pulse: 100  Resp: 16  Temp: 36.4 C    Last Pain:  Vitals:   05/16/17 0609  TempSrc: Oral      Patients Stated Pain Goal: 5 (56/94/37 0052)  Complications: No apparent anesthesia complications

## 2017-05-16 NOTE — Anesthesia Procedure Notes (Signed)
Procedure Name: Intubation Date/Time: 05/16/2017 7:31 AM Performed by: Bufford Spikes Pre-anesthesia Checklist: Patient identified, Emergency Drugs available, Suction available and Patient being monitored Patient Re-evaluated:Patient Re-evaluated prior to induction Oxygen Delivery Method: Circle system utilized Preoxygenation: Pre-oxygenation with 100% oxygen Induction Type: IV induction Ventilation: Mask ventilation without difficulty Laryngoscope Size: Miller and 2 Grade View: Grade II Tube type: Oral Tube size: 7.0 mm Number of attempts: 1 Airway Equipment and Method: Stylet Placement Confirmation: ETT inserted through vocal cords under direct vision,  positive ETCO2 and breath sounds checked- equal and bilateral Secured at: 21 cm Tube secured with: Tape Dental Injury: Teeth and Oropharynx as per pre-operative assessment

## 2017-05-16 NOTE — Op Note (Addendum)
Preop Diagnosis: Pelvic Relaxation and Potential SUI   Postop Diagnosis: Pelvic Relaxation and Potential SUI   Procedure:1.TVT 2. Cystoscopy  Fluids: 2000 cc (total)  UOP: 350 cc (total)  EBL: 100 cc (total)  Complications:none  Procedure:The patient was in the operating room already under anesthesia in the dorsal lithotomy position after the risks, benefits and alternatives were discussed with patient prior to starting Dr. Boyd Kerbs part of the procedure, consent signed and witnessed.  A time out was performed and patient identified as Jordan Willis for a TVT and Cystoscopy.  A weighted speculum was placed in the patient's vagina and the anterior vaginal wall was injected with dilute pitressin at a concentration of 20 units of pitressin in a total of 50cc of normal saline.  An incision was made in the anterior wall of the vagina for approximately 1cm beneath the midurethra and the underlying tissue was dissected away from the anterior vaginal wall down to the level of the lower symphysis pubis bilaterally. Attention was then turned to the mons pubis where two 5 mm incisions were made 2 fingerbreadths from the midline. The transabdominal guide was then passed through the mons pubis incision on the patient's right down through the space of Retzius and out through the anterior vaginal wall after deflecting the rigid urethral catheter guide to the ipsilateral side. The same was done on the contralateral side. Cystoscopy was performed and no invadvertant bladder injury was noted. The bladder was drained with a Foley while deflecting the rigid urethral catheter guide to the patient's right and the mesh was attached to the transabdominal guide and elevated up through the space of Retzius and out through the incision on the mons pubis on the ipsilateral side. The same was done on the contralateral side. Cystoscopy was performed again and no inadvertant bladder injury was noted. The 49 French Foley was left in the  urethra and a large Claiborne Billings was placed between the urethra and the mesh in order to leave the mesh slack beneath the midurethra. The mesh was then cut flush with the skin at the mons pubis incisions bilaterally. Methylene blue had been administered, cystoscopy was performed again and bilateral ureters were noted to efflux (the left more vigorous than the right). The bilateral incisions on the mons pubis were then cleaned and Dermabond applied. The anterior vaginal wall incision was repaired with 2-0 vicryl with interrupted stitches.  Vagina was packed with estrogen soaked vaginal packing.  Sponge, lap and needle count was correct.  The patient tolerated the procedure well and was returned to the recovery room in good condition.

## 2017-05-16 NOTE — Op Note (Signed)
Preoperative diagnosis: uterine fibroids, pelvic prolapse  Postoperative diagnosis: Same,pelvic adhesions, bicornuate uterus  Anesthesia: General  Anesthesiologist: Dr. Royce Macadamia  Procedure: Robotically assisted total hysterectomy  Dr Cletis Media                    TVT Dr Mancel Bale (separate op note)  Surgeon: Dr. Katharine Look Samhita Kretsch / Dr Mancel Bale  Assistant: Earnstine Regal P.A.-C.  Estimated blood loss: 50 cc  Procedure:  After being informed of the planned procedure with possible complications including but not limited to bleeding, infection, injury to other organs, need for laparotomy, possible need for morcellation with risks and benefits reviewed, expected hospital stay and recovery, informed consent is obtained and patient is taken to or #7. She is placed in  lithotomy position with both arms padded and tucked on each side and bilateral knee-high sequential compressive devices. She is given general anesthesia with endotracheal intubation without any complication. She is prepped and draped in a sterile fashion. A three-way Foley catheter is inserted in her bladder.  Pelvic exam reveals: an enlarged  and irregularly shaped uterus, approximately 14 week size  A weighted speculum is inserted in the vagina and the anterior lip of the cervix is grasped with a tenaculum forcep.. The uterus was then sounded at 10 cm. We easily dilate the cervix using Hegar dilator to  #27 which allows for easy placement of the intrauterine RUMI manipulator with a 3.5 KOH ring and a vaginal occluder. The ring is sutured to the cervix with 0 Vicryl.  Trocar placement is decided. We infiltrate the umbilicus with 10 cc of ropivacaine per protocol and perform a 10 mm semi-elliptical incision which is brought down bluntly to the fascia. The fascia is identified and grasped with Coker forceps. The fascia is incised with Mayo scissors. Peritoneum is entered bluntly. A pursestring suture of 0 Vicryl is placed on the fascia and a 10 mm  Hassan trocar is easily inserted in the abdominal cavity held in placed with a Purstring suture. This allows for easy insufflation of a pneumoperitoneum using warmed CO2 at a maximum pressure of 15 mm of mercury. 60 cc of Ropivacaine 0.5 % diluted 1 in 1 is sent in the pelvis and the patient is positioned in reverse Trendelenburg. We then placed two 53mm robotic trocar on the left, one 18mm robotic trocar on the right and one 5 mm patient's side assistant trocar on the right  after infiltrating every site  with ropivacaine per protocol. The robot is docked on the left of the patient after positioning her in Trendelenburg. A monopolar scissor is inserted in arm #1, a PK gyrus forcep is inserted in arm #2 and a Tenaculum is inserted in arm #3.  Preparation and docking is completed in 60 minutes.  Observation: The uterus appears bicornuate vs didelphic with multiple fibroids between the 2 fundus and a dominant fibroid measuring 6 cm anteriorly. We note also large bilateral LUS/cervical fibroids. Both tubes and ovaries are absent. There are filmy adhesions in the posterior cul-de-sac. Liver is normal. Appendix is not seen.  In order to be able to mobilize the uterus and proceed with hysterectomy we have to begin the procedure by removing the large anterior fibroid. This fibroid is infiltrated with vasopressin 20 units in 100 cc of saline and its the serosa is incised using monopolar scissors. The fibroid is grasped with a tenaculum and we proceed with systematic dissection using hot scissors and traction until the fibroid is completely enucleated. Its appearance is  multilobulated and its consistency is compatible with degeneration. But due to the age of the patient, a biopsy of the fibroid is taken and sent for frozen section which is call back as a normal appearing benign fibroid.  We then proceed on the left  side by cauterizing the left round ligament. This gives [place] entry into the retroperitoneum space and  allows [place] to dissect the left broad ligament all the way to the right side. The left lower uterine segment fibroid needs to be dissected away from the pelvic wall.  We then proceed with systematic dissection of the bladder over way from the anterior vaginal cuff which is easily identified with the KOH ring. The plane of dissection is easily identified and confirmed after filling the bladder with 200 cc of saline. We are able to dissect the bladder 2 cm below the KOH ring. We proceed with sharp dissection of all adhesions in the posterior cul-de-sac in order to be able to reach the posterior Koh ring .We then opened the posterior left broad ligament all the way to the posterior KOH ring after identifying the full course of the left ureter.   Moving to the right  side we cauterize the left round ligament. This pedicle is the sectionned. Entry into the retroperitoneal space allows [place] to complete dissection of the bladder on the right side and skeletonized the all uterine vessels. The right broad ligament is then dissected all the way to the posterior KOH ring after identifying the full course of the left ureter. Dissection of the lower uterine segment-cervical fibroids is extensive in order to reach the colon ring on both sides.   With pressure on the KOH ring and the bladder fully dissected below we are able to cauterize the uterine vessels on both sides at the level of the KOH ring.  The vaginal occluder is inflated and we proceed with a 360 colpotomy using an open monopolar scissors and freeing the uterus entirely.  The uterus is delivered vaginally with traction only. The vaginal occluder is reinserted in the vagina to maintain pneumoperitoneum.  Instruments are then modified for a suture cut in arm #1 and a long tip forcep in arm #2. We proceed with closure of the vaginal cuff with a running suture of 0V LOCK. The needle is attached to the anterior abdominal wall for future removal. We irrigated  profusely with warm saline and confirm a satisfactory hemostasis as well as 2 normal ureters with good mobility and no dilatation.The needle is removed via the 8 mm undocked trocar.  All instruments are then removed and the robot is undocked. Console time: 2 hours and 45 minutes.  All trochars are removed under direct visualization after evacuating the pneumoperitoneum.  The fascia of the supraumbilical incision is closed with the previously placed pursestring suture of 0 Vicryl. All incisions are then closed with subcuticular suture of 3-0 Monocryl and Dermabond.  A speculum is inserted in the vagina to confirm a adequate closure of the vaginal cuff and good hemostasis. We notice at this time there is no residual cystocele to correct. Patient is released to the care of Dr. Mancel Bale is going to proceed with a TVT.  Instrument and sponge count is complete x2. Estimated blood loss is . The pr 50 cc ocedure is well tolerated by the patient is taken to recovery room in a well and stable condition.  Specimen: Uterus and tubes weighing 363 g

## 2017-05-16 NOTE — Anesthesia Postprocedure Evaluation (Signed)
Anesthesia Post Note  Patient: Jordan Willis  Procedure(s) Performed: Procedure(s) (LRB): ROBOTIC ASSISTED TOTAL HYSTERECTOMY (N/A) TRANSVAGINAL TAPE (TVT) PROCEDURE (N/A) CYSTOSCOPY (N/A)     Patient location during evaluation: PACU Anesthesia Type: General Level of consciousness: awake and alert Pain management: pain level controlled Vital Signs Assessment: post-procedure vital signs reviewed and stable Respiratory status: spontaneous breathing, nonlabored ventilation, respiratory function stable and patient connected to nasal cannula oxygen Cardiovascular status: blood pressure returned to baseline and stable Postop Assessment: no signs of nausea or vomiting Anesthetic complications: no    Last Vitals:  Vitals:   05/16/17 1515 05/16/17 1530  BP: (!) 147/72 (!) 148/68  Pulse: 89 87  Resp: 13 16  Temp: 36.8 C 36.9 C    Last Pain:  Vitals:   05/16/17 1530  TempSrc:   PainSc: 4    Pain Goal: Patients Stated Pain Goal: 4 (05/16/17 1530)               Thurmond Butts P Jazaria Jarecki

## 2017-05-16 NOTE — Anesthesia Preprocedure Evaluation (Addendum)
Anesthesia Evaluation    Airway Mallampati: I  TM Distance: >3 FB Neck ROM: Full    Dental  (+) Teeth Intact, Caps,    Pulmonary shortness of breath and with exertion,    Pulmonary exam normal breath sounds clear to auscultation       Cardiovascular negative cardio ROS Normal cardiovascular exam Rhythm:Regular Rate:Normal     Neuro/Psych  Headaches, negative psych ROS   GI/Hepatic negative GI ROS, Neg liver ROS,   Endo/Other  Hyperlipidemia Hx/o Breast Ca  Renal/GU Left renal calculus Bladder dysfunction  Cystocele     Musculoskeletal  (+) Arthritis , Osteoarthritis,  DDD lumbar spine   Abdominal (+) + obese,   Peds  Hematology negative hematology ROS (+)   Anesthesia Other Findings   Reproductive/Obstetrics Uterine fibroids Pelvic prolapse                           Anesthesia Physical Anesthesia Plan  ASA: II  Anesthesia Plan: General   Post-op Pain Management:    Induction: Intravenous  PONV Risk Score and Plan: 4 or greater and Ondansetron, Dexamethasone, Propofol, Midazolam, Scopolamine patch - Pre-op, Metaclopromide and Treatment may vary due to age or medical condition  Airway Management Planned: Oral ETT  Additional Equipment:   Intra-op Plan:   Post-operative Plan: Extubation in OR  Informed Consent: I have reviewed the patients History and Physical, chart, labs and discussed the procedure including the risks, benefits and alternatives for the proposed anesthesia with the patient or authorized representative who has indicated his/her understanding and acceptance.   Dental advisory given  Plan Discussed with: Anesthesiologist, CRNA and Surgeon  Anesthesia Plan Comments:        Anesthesia Quick Evaluation

## 2017-05-16 NOTE — Anesthesia Preprocedure Evaluation (Signed)
Anesthesia Evaluation  Patient identified by MRN, date of birth, ID band Patient awake    Reviewed: Allergy & Precautions, NPO status , Patient's Chart, lab work & pertinent test results  Airway Mallampati: II  TM Distance: >3 FB Neck ROM: Full    Dental no notable dental hx.    Pulmonary neg pulmonary ROS,    Pulmonary exam normal breath sounds clear to auscultation       Cardiovascular negative cardio ROS Normal cardiovascular exam Rhythm:Regular Rate:Normal  ECG: NSR, rate 79   Neuro/Psych negative neurological ROS  negative psych ROS   GI/Hepatic negative GI ROS, Neg liver ROS,   Endo/Other  negative endocrine ROS  Renal/GU negative Renal ROS     Musculoskeletal negative musculoskeletal ROS (+)   Abdominal   Peds  Hematology negative hematology ROS (+)   Anesthesia Other Findings Hyperlipidemia Breast CA s/p chemo and radiation  Reproductive/Obstetrics                             Anesthesia Physical Anesthesia Plan  ASA: II  Anesthesia Plan: General   Post-op Pain Management:    Induction: Intravenous  PONV Risk Score and Plan: 3 and Ondansetron, Dexamethasone, Propofol, Midazolam and Scopolamine patch - Pre-op  Airway Management Planned: Oral ETT  Additional Equipment:   Intra-op Plan:   Post-operative Plan: Extubation in OR  Informed Consent: I have reviewed the patients History and Physical, chart, labs and discussed the procedure including the risks, benefits and alternatives for the proposed anesthesia with the patient or authorized representative who has indicated his/her understanding and acceptance.   Dental advisory given  Plan Discussed with: CRNA  Anesthesia Plan Comments:         Anesthesia Quick Evaluation

## 2017-05-16 NOTE — Progress Notes (Signed)
Day of Surgery Procedure(s) (LRB): ROBOTIC ASSISTED TOTAL HYSTERECTOMY (N/A) TRANSVAGINAL TAPE (TVT) PROCEDURE (N/A) CYSTOSCOPY (N/A)  Subjective: Patient reports vaginal pain.  Denies nausea.    Objective: I have reviewed patient's vital signs and intake and output.  UOP 165 cc/3hrs  General: alert and no distress Resp: clear to auscultation bilaterally Cardio: regular rate and rhythm GI: soft, app tender, +BS, ND, dressing intact and clean Extremities: SCDs are on, no calf tenderness Vaginal Bleeding: minimal, packing in place  Assessment: s/p Procedure(s): ROBOTIC ASSISTED TOTAL HYSTERECTOMY (N/A) TRANSVAGINAL TAPE (TVT) PROCEDURE (N/A) CYSTOSCOPY (N/A): stable  Plan: encourage IS  SCDs for DVT prophylaxis CBC in AM Lovenox ordered in the morning Pt was trying not to take pain medicine but she will take it now Packing to be d/c'd in am UOP is good, d/c foley in morning with good UOP   LOS: 0 days    Jordan Willis Y 05/16/2017, 6:27 PM

## 2017-05-17 DIAGNOSIS — Z87442 Personal history of urinary calculi: Secondary | ICD-10-CM | POA: Diagnosis not present

## 2017-05-17 DIAGNOSIS — D259 Leiomyoma of uterus, unspecified: Secondary | ICD-10-CM | POA: Diagnosis not present

## 2017-05-17 DIAGNOSIS — Z803 Family history of malignant neoplasm of breast: Secondary | ICD-10-CM | POA: Diagnosis not present

## 2017-05-17 DIAGNOSIS — Z9889 Other specified postprocedural states: Secondary | ICD-10-CM | POA: Diagnosis not present

## 2017-05-17 DIAGNOSIS — N736 Female pelvic peritoneal adhesions (postinfective): Secondary | ICD-10-CM | POA: Diagnosis not present

## 2017-05-17 DIAGNOSIS — Z833 Family history of diabetes mellitus: Secondary | ICD-10-CM | POA: Diagnosis not present

## 2017-05-17 DIAGNOSIS — Z7951 Long term (current) use of inhaled steroids: Secondary | ICD-10-CM | POA: Diagnosis not present

## 2017-05-17 DIAGNOSIS — E785 Hyperlipidemia, unspecified: Secondary | ICD-10-CM | POA: Diagnosis not present

## 2017-05-17 DIAGNOSIS — Z923 Personal history of irradiation: Secondary | ICD-10-CM | POA: Diagnosis not present

## 2017-05-17 DIAGNOSIS — Z853 Personal history of malignant neoplasm of breast: Secondary | ICD-10-CM | POA: Diagnosis not present

## 2017-05-17 DIAGNOSIS — Z886 Allergy status to analgesic agent status: Secondary | ICD-10-CM | POA: Diagnosis not present

## 2017-05-17 DIAGNOSIS — Z9221 Personal history of antineoplastic chemotherapy: Secondary | ICD-10-CM | POA: Diagnosis not present

## 2017-05-17 DIAGNOSIS — Z79899 Other long term (current) drug therapy: Secondary | ICD-10-CM | POA: Diagnosis not present

## 2017-05-17 DIAGNOSIS — N8189 Other female genital prolapse: Secondary | ICD-10-CM | POA: Diagnosis not present

## 2017-05-17 DIAGNOSIS — Z8261 Family history of arthritis: Secondary | ICD-10-CM | POA: Diagnosis not present

## 2017-05-17 DIAGNOSIS — M5136 Other intervertebral disc degeneration, lumbar region: Secondary | ICD-10-CM | POA: Diagnosis not present

## 2017-05-17 DIAGNOSIS — Z8249 Family history of ischemic heart disease and other diseases of the circulatory system: Secondary | ICD-10-CM | POA: Diagnosis not present

## 2017-05-17 LAB — CBC
HCT: 35.1 % — ABNORMAL LOW (ref 36.0–46.0)
HEMOGLOBIN: 11.6 g/dL — AB (ref 12.0–15.0)
MCH: 29.2 pg (ref 26.0–34.0)
MCHC: 33 g/dL (ref 30.0–36.0)
MCV: 88.4 fL (ref 78.0–100.0)
PLATELETS: 249 10*3/uL (ref 150–400)
RBC: 3.97 MIL/uL (ref 3.87–5.11)
RDW: 12.5 % (ref 11.5–15.5)
WBC: 12.3 10*3/uL — ABNORMAL HIGH (ref 4.0–10.5)

## 2017-05-17 MED ORDER — ONDANSETRON HCL 4 MG PO TABS: 4.0000 mg | ORAL_TABLET | Freq: Four times a day (QID) | ORAL | 0 refills | Status: DC | PRN

## 2017-05-17 MED ORDER — SIMETHICONE 80 MG PO CHEW
160.0000 mg | CHEWABLE_TABLET | Freq: Four times a day (QID) | ORAL | Status: DC | PRN
  Administered 2017-05-17: 160 mg via ORAL
  Filled 2017-05-17: qty 2

## 2017-05-17 MED ORDER — IBUPROFEN 600 MG PO TABS: ORAL_TABLET | ORAL | 0 refills | Status: DC

## 2017-05-17 MED ORDER — OXYCODONE-ACETAMINOPHEN 5-325 MG PO TABS: 1.0000 | ORAL_TABLET | ORAL | 0 refills | Status: DC | PRN

## 2017-05-17 MED ORDER — CIPROFLOXACIN HCL 500 MG PO TABS: 500.0000 mg | ORAL_TABLET | Freq: Two times a day (BID) | ORAL | 0 refills | Status: DC

## 2017-05-17 NOTE — Discharge Instructions (Signed)
Call [facility] @ [phone] if:  You have a temperature greater than or equal to 100.4 degrees Farenheit orally You have pain that is not made better by the pain medication given and taken as directed You have excessive bleeding or problems urinating  Take Colace (Docusate Sodium/Stool Softener) 100 mg 2-3 times daily while taking narcotic pain medicine to avoid constipation or until bowel movements are regular. Take Ibuprofen 600 mg with food every 6 hours for 5 days then as needed for pain.  You may drive after 1 week You may walk up steps  You may shower  You may resume a regular diet  Keep incisions clean and dry; remove honeycomb dressing on May 22, 2017 Do not lift over 15 pounds for 6 weeks Avoid anything in vagina for 6 weeks

## 2017-05-17 NOTE — Progress Notes (Signed)
Jordan Willis is a87 y.o.  [id number]  Post Op Date #1  Robot Assisted Hysterectomy, Placement of Tension Free Vaginal Tape and Cystoscopy  Subjective: Patient is Doing well postoperatively. Patient has Pain is controlled with current analgesics. Medications being used: prescription NSAID's including Ibuprofen 600 mg and narcotic analgesics including Percocet 5/325.  Ambulating in halls without dizziness, tolerating crackers and liquids without nausea but hasn't passed flatus or voided since Foley was removed.   Objective: Vital signs in last 24 hours: Temp:  [98.2 F (36.8 C)-99 F (37.2 C)] 99 F (37.2 C) (07/27 0400) Pulse Rate:  [76-101] 79 (07/27 0400) Resp:  [11-20] 18 (07/27 0400) BP: (109-157)/(51-78) 119/52 (07/27 0400) SpO2:  [95 %-100 %] 98 % (07/27 0400) FiO2 (%):  [2 %] 2 % (07/26 1530) Weight:  [200 lb (90.7 kg)] 200 lb (90.7 kg) (07/26 1705)  Intake/Output from previous day: 07/26 0701 - 07/27 0700 In: 3927.5 [P.O.:1215; I.V.:2712.5] Out: 2415 [Urine:2315] Intake/Output this shift: No intake/output data recorded.  Recent Labs Lab 05/17/17 0000  WBC 12.3*  HGB 11.6*  HCT 35.1*  PLT 249    No results for input(s): NA, K, CL, CO2, BUN, CREATININE, CALCIUM, PROT, BILITOT, ALKPHOS, ALT, AST, GLUCOSE in the last 168 hours.  Invalid input(s): LABALBU  EXAM: General: alert, cooperative and no distress Resp: clear to auscultation bilaterally Cardio: regular rate and rhythm, S1, S2 normal, no murmur, click, rub or gallop GI: Decreased bowel sounds, soft;  incisions intact without evidence of infection; umbilical dressing is clean/dry/intact Extremities: Homans sign is negative, no sign of DVT and SCD hose in place-functioning and no calf tenderness.   Assessment: s/p Procedure(s): ROBOTIC ASSISTED TOTAL HYSTERECTOMY TRANSVAGINAL TAPE (TVT) PROCEDURE CYSTOSCOPY: stable and progressing well  Plan: Advance diet Encourage ambulation Awaiting patient to void.   Plan to discharge home later today  LOS: 0 days    POWELL,ELMIRA, PA-C 05/17/2017 7:48 AM  Pt voiding spontaneously without difficulty.  Tolerating po and ambulating well.  D/C instructions reviewed.  Pt ready for discharge.

## 2017-05-17 NOTE — Discharge Summary (Signed)
Physician Discharge Summary  Patient ID: Jordan Willis MRN: [id number] DOB/AGE: March 27, 1951 66 y.o.  Admit date: 05/16/2017 Discharge date: 05/17/2017   Discharge Diagnoses:  Uterine Fibroids and Symptomatic Pelvic Relaxation Active Problems:   Pelvic prolapse   Operation: Robot Assisted Laparoscopic Hysterectomy, Placement of Tension Free Vaginal Tape and Cystoscopy   Discharged Condition: Good  Hospital Course: On the date of admission the patient underwent the aforementioned procedures and tolerated them well.  Post operative course was unremarkable with the patient resuming bowel and bladder function by post operative day #1 and was therefore deemed ready for discharge home. Discharge hemoglobin was 11.6.  Disposition: 01-Home or Self Care  Discharge Medications:  Allergies as of 05/17/2017      Reactions   Asa [aspirin] Palpitations   Heart fluttering in high doses.  Patient can take low dose 81 mg.      Medication List    STOP taking these medications   solifenacin 5 MG tablet Commonly known as:  VESICARE     TAKE these medications   aspirin EC 81 MG tablet Take 40.5 mg by mouth daily.   atorvastatin 10 MG tablet Commonly known as:  LIPITOR Take 10 mg by mouth daily with lunch.   B-12 PO Take 1 tablet by mouth daily.   CALCIUM PO Take 1 tablet by mouth every other day.   fexofenadine-pseudoephedrine 60-120 MG 12 hr tablet Commonly known as:  ALLEGRA-D Take 1 tablet by mouth daily as needed (allergies).   fluticasone 50 MCG/ACT nasal spray Commonly known as:  FLONASE Place 1 spray into both nostrils daily as needed for allergies.   GLUCOSAMINE PO Take 1 tablet by mouth daily.   ibuprofen 600 MG tablet Commonly known as:  ADVIL,MOTRIN 1 po pc every 6 hours for  5 days then prn-pain What changed:  medication strength  how much to take  how to take this  when to take this  reasons to take this  additional instructions   LUBRICANT DROPS  OP Apply 1 drop to eye daily as needed (dry eyes).   MELATONIN PO Take 2 tablets by mouth at bedtime as needed (sleep).   multivitamin with minerals Tabs tablet Take 1 tablet by mouth daily.   OMEGA 3 PO Take 1 capsule by mouth daily.   ondansetron 4 MG tablet Commonly known as:  ZOFRAN Take 1 tablet (4 mg total) by mouth every 6 (six) hours as needed for nausea.   oxyCODONE-acetaminophen 5-325 MG tablet Commonly known as:  PERCOCET/ROXICET Take 1-2 tablets by mouth every 4 (four) hours as needed for severe pain (moderate to severe pain (when tolerating fluids)).   PROAIR HFA 108 (90 Base) MCG/ACT inhaler Generic drug:  albuterol Inhale 1-2 puffs into the lungs every 6 (six) hours as needed for wheezing or shortness of breath.   Probiotic Caps Take 1 capsule by mouth daily as needed (upset stomach).   topiramate 25 MG tablet Commonly known as:  TOPAMAX Take one tablet at night for one week, then take 2 tablets at night for one week, then take 3 tablets at night.   VITAMIN D PO Take 1 tablet by mouth daily.         Follow-up: Dr. Delsa Bern on June 06, 2017 at 9 a.m. and Dr. Harvie Bridge. Mancel Bale on July 02, 2017 at 9:30 a.m.     SignedEarnstine Regal, PA-C 05/17/2017, 8:03 AM

## 2017-05-17 NOTE — Progress Notes (Signed)
Pt discharged with printed instructions. Pt verbalized an understanding. No concerns noted. Trayonna Bachmeier L Avea Mcgowen, RN 

## 2017-05-17 NOTE — Progress Notes (Signed)
Foley and vaginal packing removed this a.m as ordered . Scant amount of blood vaginal packing. Pt tolerated well.

## 2017-05-28 DIAGNOSIS — R102 Pelvic and perineal pain: Secondary | ICD-10-CM | POA: Diagnosis not present

## 2017-05-30 DIAGNOSIS — Z139 Encounter for screening, unspecified: Secondary | ICD-10-CM | POA: Diagnosis not present

## 2017-06-18 ENCOUNTER — Encounter (HOSPITAL_COMMUNITY): Payer: Self-pay | Admitting: Obstetrics and Gynecology

## 2017-07-02 DIAGNOSIS — R300 Dysuria: Secondary | ICD-10-CM | POA: Diagnosis not present

## 2017-07-18 ENCOUNTER — Encounter: Payer: Self-pay | Admitting: Hematology and Oncology

## 2017-07-18 ENCOUNTER — Ambulatory Visit (HOSPITAL_BASED_OUTPATIENT_CLINIC_OR_DEPARTMENT_OTHER): Payer: Medicare Other | Admitting: Hematology and Oncology

## 2017-07-18 ENCOUNTER — Telehealth: Payer: Self-pay | Admitting: Hematology and Oncology

## 2017-07-18 DIAGNOSIS — Z853 Personal history of malignant neoplasm of breast: Secondary | ICD-10-CM

## 2017-07-18 DIAGNOSIS — Z171 Estrogen receptor negative status [ER-]: Secondary | ICD-10-CM

## 2017-07-18 DIAGNOSIS — C50412 Malignant neoplasm of upper-outer quadrant of left female breast: Secondary | ICD-10-CM

## 2017-07-18 NOTE — Telephone Encounter (Signed)
Spoke with patient regarding her appts. Did not want avs or calendar.

## 2017-07-18 NOTE — Progress Notes (Signed)
Patient Care Team: [name], MD as PCP - General (Internal Medicine) Delsa Bern, MD as Attending Physician (Obstetrics and Gynecology) Posey Boyer, MD as Consulting Physician (Family Medicine)  DIAGNOSIS:  Encounter Diagnosis  Name Primary?  . Malignant neoplasm of upper-outer quadrant of left breast in female, estrogen receptor negative ([place])     SUMMARY OF ONCOLOGIC HISTORY:   Malignant neoplasm of upper-outer quadrant of left breast in female, estrogen receptor negative ([place])   02/02/2005 Initial Diagnosis    Left breast needle core biopsy showed invasive mammary cancer      02/11/2005 Breast MRI    Left breast upper outer quadrant: 4.4 x 2.6 x 3.7 cm mass and a smaller adjacent mass measuring 1 x 0.8 x 0.4 cm      03/02/2005 - 07/20/2005 Neo-Adjuvant Chemotherapy    Before meals 4 followed by Taxotere 4      08/03/2005 Surgery    Left breast lumpectomy: 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%, PR 0%, HER-2 negative, Ki-67 31%      09/26/2005 - 11/13/2005 Radiation Therapy    Adjuvant radiation therapy      03/18/2008 Initial Biopsy    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      03/22/2008 Surgery    Right breast lumpectomy 0.9 cm DCIS ER 90% PR 98% stage 0      04/11/2008 Procedure    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      05/10/2008 - 07/02/2008 Radiation Therapy    Adjuvant radiation therapy      07/17/2008 - 04/16/2009 Anti-estrogen oral therapy    Femara then switched to tamoxifen which was discontinued in 2010 due to concern about uterine cancer       CHIEF COMPLIANT: Annual surveillance of BRCA mutation positive with prior breast cancer  INTERVAL HISTORY: Jordan Willis is a  66 year old with above-mentioned history recurrent breast cancer who had a BRCA2 mutation and is here for annual follow-up for surveillance. She has had mild  discomfort in the left axillary area related to weather changes. She had a breast mammogram and an MRI both of which were normal. She would like to do all further mammograms with her gynecologist. She had a hysterectomy for uterine fibroids and is healed very well from that.  REVIEW OF SYSTEMS:   Constitutional: Denies fevers, chills or abnormal weight loss Eyes: Denies blurriness of vision Ears, nose, mouth, throat, and face: Denies mucositis or sore throat Respiratory: Denies cough, dyspnea or wheezes Cardiovascular: Denies palpitation, chest discomfort Gastrointestinal:  Denies nausea, heartburn or change in bowel habits Skin: Denies abnormal skin rashes Lymphatics: Denies new lymphadenopathy or easy bruising Neurological:Denies numbness, tingling or new weaknesses Behavioral/Psych: Mood is stable, no new changes  Extremities: No lower extremity edema All other systems were reviewed with the patient and are negative.  I have reviewed the past medical history, past surgical history, social history and family history with the patient and they are unchanged from previous note.  ALLERGIES:  is allergic to asa [aspirin].  MEDICATIONS:  Current Outpatient Prescriptions  Medication Sig Dispense Refill  . aspirin EC 81 MG tablet Take 40.5 mg by mouth daily.    Marland Kitchen atorvastatin (LIPITOR) 10 MG tablet Take 10 mg by mouth daily with lunch.     Marland Kitchen CALCIUM PO Take 1 tablet by mouth every other day.    . Cholecalciferol (VITAMIN D  PO) Take 1 tablet by mouth daily.    . ciprofloxacin (CIPRO) 500 MG tablet Take 1 tablet (500 mg total) by mouth 2 (two) times daily. 6 tablet 0  . Cyanocobalamin (B-12 PO) Take 1 tablet by mouth daily.    . fexofenadine-pseudoephedrine (ALLEGRA-D) 60-120 MG 12 hr tablet Take 1 tablet by mouth daily as needed (allergies).    . fluticasone (FLONASE) 50 MCG/ACT nasal spray Place 1 spray into both nostrils daily as needed for allergies.     . Glucosamine HCl (GLUCOSAMINE PO)  Take 1 tablet by mouth daily.    Marland Kitchen ibuprofen (ADVIL,MOTRIN) 600 MG tablet 1 po pc every 6 hours for  5 days then prn-pain 30 tablet 0  . MELATONIN PO Take 2 tablets by mouth at bedtime as needed (sleep).    . Multiple Vitamin (MULTIVITAMIN WITH MINERALS) TABS tablet Take 1 tablet by mouth daily.    . Omega-3 Fatty Acids (OMEGA 3 PO) Take 1 capsule by mouth daily.     . ondansetron (ZOFRAN) 4 MG tablet Take 1 tablet (4 mg total) by mouth every 6 (six) hours as needed for nausea. 20 tablet 0  . oxyCODONE-acetaminophen (PERCOCET/ROXICET) 5-325 MG tablet Take 1-2 tablets by mouth every 4 (four) hours as needed for severe pain (moderate to severe pain (when tolerating fluids)). 20 tablet 0  . Polyvinyl Alcohol (LUBRICANT DROPS OP) Apply 1 drop to eye daily as needed (dry eyes).    Marland Kitchen PROAIR HFA 108 (90 Base) MCG/ACT inhaler Inhale 1-2 puffs into the lungs every 6 (six) hours as needed for wheezing or shortness of breath.     . Probiotic CAPS Take 1 capsule by mouth daily as needed (upset stomach).    . topiramate (TOPAMAX) 25 MG tablet Take one tablet at night for one week, then take 2 tablets at night for one week, then take 3 tablets at night. (Patient not taking: Reported on 05/08/2017) 90 tablet 3   No current facility-administered medications for this visit.     PHYSICAL EXAMINATION: ECOG PERFORMANCE STATUS: 1 - Symptomatic but completely ambulatory  Vitals:   07/18/17 0951  BP: 135/62  Pulse: 78  Resp: 18  Temp: 97.8 F (36.6 C)  SpO2: 100%   Filed Weights   07/18/17 0951  Weight: 199 lb 4.8 oz (90.4 kg)    GENERAL:alert, no distress and comfortable SKIN: skin color, texture, turgor are normal, no rashes or significant lesions EYES: normal, Conjunctiva are pink and non-injected, sclera clear OROPHARYNX:no exudate, no erythema and lips, buccal mucosa, and tongue normal  NECK: supple, thyroid normal size, non-tender, without nodularity LYMPH:  no palpable lymphadenopathy in the  cervical, axillary or inguinal LUNGS: clear to auscultation and percussion with normal breathing effort HEART: regular rate & rhythm and no murmurs and no lower extremity edema ABDOMEN:abdomen soft, non-tender and normal bowel sounds MUSCULOSKELETAL:no cyanosis of digits and no clubbing  NEURO: alert & oriented x 3 with fluent speech, no focal motor/sensory deficits EXTREMITIES: No lower extremity edema  LABORATORY DATA:  I have reviewed the data as listed   Chemistry      Component Value Date/Time   NA 137 05/09/2017 1055   NA 139 10/06/2013 0900   K 3.5 05/09/2017 1055   K 3.6 10/06/2013 0900   CL 103 05/09/2017 1055   CL 104 02/20/2013 1109   CO2 27 05/09/2017 1055   CO2 24 10/06/2013 0900   BUN 11 05/09/2017 1055   BUN 10.4 10/06/2013 0900   CREATININE  0.51 05/09/2017 1055   CREATININE 0.7 10/06/2013 0900      Component Value Date/Time   CALCIUM 9.5 05/09/2017 1055   CALCIUM 10.3 10/06/2013 0900   ALKPHOS 133 10/06/2013 0900   AST 22 10/06/2013 0900   ALT 25 10/06/2013 0900   BILITOT 0.24 10/06/2013 0900       Lab Results  Component Value Date   WBC 12.3 (H) 05/17/2017   HGB 11.6 (L) 05/17/2017   HCT 35.1 (L) 05/17/2017   MCV 88.4 05/17/2017   PLT 249 05/17/2017   NEUTROABS 2.8 10/06/2013    ASSESSMENT & PLAN:  Malignant neoplasm of upper-outer quadrant of left breast in female, estrogen receptor negative ([facility]) 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 Breast exam 07/18/2017: Normal 2. Mammogram 02/07/2017 No abnormalities. Postsurgical changes. Breast Density Category B. 3. Breast MRI05/11/2016: No evidence of breast malignancy  BRCA2 mutation: I recommended annual  breast MRIs in addition to mammograms. We would like to alternate them by 6 months apart.  Return to clinic in 1 year for follow-up   I spent 25 minutes talking to the patient of which more than half was spent in counseling and coordination of care.  No orders of the defined types were placed in this encounter.  The patient has a good understanding of the overall plan. she agrees with it. she will call with any problems that may develop before the next visit here.   Rulon Eisenmenger, MD 07/18/17

## 2017-07-18 NOTE — Assessment & Plan Note (Signed)
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 Breast exam 07/18/2017: Normal 2. Mammogram 02/07/2017 No abnormalities. Postsurgical changes. Breast Density Category B. 3. Breast MRI05/11/2016: No evidence of breast malignancy  BRCA2 mutation: I recommended annual breast MRIs in addition to mammograms. We would like to alternate them by 6 months apart.  Return to clinic in 1 year for follow-up

## 2017-08-02 DIAGNOSIS — N3281 Overactive bladder: Secondary | ICD-10-CM | POA: Diagnosis not present

## 2017-08-19 DIAGNOSIS — Z853 Personal history of malignant neoplasm of breast: Secondary | ICD-10-CM | POA: Diagnosis not present

## 2017-08-28 DIAGNOSIS — Z853 Personal history of malignant neoplasm of breast: Secondary | ICD-10-CM | POA: Diagnosis not present

## 2017-10-03 DIAGNOSIS — J0100 Acute maxillary sinusitis, unspecified: Secondary | ICD-10-CM | POA: Diagnosis not present

## 2017-10-03 DIAGNOSIS — J209 Acute bronchitis, unspecified: Secondary | ICD-10-CM | POA: Diagnosis not present

## 2017-10-03 DIAGNOSIS — J040 Acute laryngitis: Secondary | ICD-10-CM | POA: Diagnosis not present

## 2017-10-04 DIAGNOSIS — H25012 Cortical age-related cataract, left eye: Secondary | ICD-10-CM | POA: Diagnosis not present

## 2017-10-04 DIAGNOSIS — H2512 Age-related nuclear cataract, left eye: Secondary | ICD-10-CM | POA: Diagnosis not present

## 2017-10-04 DIAGNOSIS — H2511 Age-related nuclear cataract, right eye: Secondary | ICD-10-CM | POA: Diagnosis not present

## 2017-10-04 DIAGNOSIS — H43393 Other vitreous opacities, bilateral: Secondary | ICD-10-CM | POA: Diagnosis not present

## 2017-10-04 DIAGNOSIS — H25011 Cortical age-related cataract, right eye: Secondary | ICD-10-CM | POA: Diagnosis not present

## 2017-10-09 DIAGNOSIS — Z9013 Acquired absence of bilateral breasts and nipples: Secondary | ICD-10-CM | POA: Diagnosis not present

## 2017-10-11 DIAGNOSIS — N3281 Overactive bladder: Secondary | ICD-10-CM | POA: Diagnosis not present

## 2017-10-11 DIAGNOSIS — Z09 Encounter for follow-up examination after completed treatment for conditions other than malignant neoplasm: Secondary | ICD-10-CM | POA: Diagnosis not present

## 2017-10-23 DIAGNOSIS — H25812 Combined forms of age-related cataract, left eye: Secondary | ICD-10-CM | POA: Diagnosis not present

## 2017-10-23 DIAGNOSIS — H25012 Cortical age-related cataract, left eye: Secondary | ICD-10-CM | POA: Diagnosis not present

## 2017-10-23 DIAGNOSIS — H5203 Hypermetropia, bilateral: Secondary | ICD-10-CM | POA: Diagnosis not present

## 2017-10-23 DIAGNOSIS — H52203 Unspecified astigmatism, bilateral: Secondary | ICD-10-CM | POA: Diagnosis not present

## 2017-10-23 DIAGNOSIS — H524 Presbyopia: Secondary | ICD-10-CM | POA: Diagnosis not present

## 2017-10-23 DIAGNOSIS — H2512 Age-related nuclear cataract, left eye: Secondary | ICD-10-CM | POA: Diagnosis not present

## 2017-10-23 HISTORY — PX: CATARACT EXTRACTION: SUR2

## 2017-10-31 DIAGNOSIS — H25011 Cortical age-related cataract, right eye: Secondary | ICD-10-CM | POA: Diagnosis not present

## 2017-10-31 DIAGNOSIS — H2511 Age-related nuclear cataract, right eye: Secondary | ICD-10-CM | POA: Diagnosis not present

## 2017-11-06 DIAGNOSIS — H5203 Hypermetropia, bilateral: Secondary | ICD-10-CM | POA: Diagnosis not present

## 2017-11-06 DIAGNOSIS — H52203 Unspecified astigmatism, bilateral: Secondary | ICD-10-CM | POA: Diagnosis not present

## 2017-11-06 DIAGNOSIS — H25811 Combined forms of age-related cataract, right eye: Secondary | ICD-10-CM | POA: Diagnosis not present

## 2017-11-06 DIAGNOSIS — H25011 Cortical age-related cataract, right eye: Secondary | ICD-10-CM | POA: Diagnosis not present

## 2017-11-06 DIAGNOSIS — H2511 Age-related nuclear cataract, right eye: Secondary | ICD-10-CM | POA: Diagnosis not present

## 2017-11-06 DIAGNOSIS — H524 Presbyopia: Secondary | ICD-10-CM | POA: Diagnosis not present

## 2017-11-06 HISTORY — PX: CATARACT EXTRACTION: SUR2

## 2017-12-11 DIAGNOSIS — N3281 Overactive bladder: Secondary | ICD-10-CM | POA: Diagnosis not present

## 2017-12-11 DIAGNOSIS — Z09 Encounter for follow-up examination after completed treatment for conditions other than malignant neoplasm: Secondary | ICD-10-CM | POA: Diagnosis not present

## 2017-12-15 DIAGNOSIS — J0100 Acute maxillary sinusitis, unspecified: Secondary | ICD-10-CM | POA: Diagnosis not present

## 2017-12-15 DIAGNOSIS — H66009 Acute suppurative otitis media without spontaneous rupture of ear drum, unspecified ear: Secondary | ICD-10-CM | POA: Diagnosis not present

## 2018-01-30 DIAGNOSIS — R079 Chest pain, unspecified: Secondary | ICD-10-CM | POA: Diagnosis not present

## 2018-01-30 DIAGNOSIS — E559 Vitamin D deficiency, unspecified: Secondary | ICD-10-CM | POA: Diagnosis not present

## 2018-01-30 DIAGNOSIS — N390 Urinary tract infection, site not specified: Secondary | ICD-10-CM | POA: Diagnosis not present

## 2018-01-30 DIAGNOSIS — E7800 Pure hypercholesterolemia, unspecified: Secondary | ICD-10-CM | POA: Diagnosis not present

## 2018-02-04 DIAGNOSIS — R748 Abnormal levels of other serum enzymes: Secondary | ICD-10-CM | POA: Diagnosis not present

## 2018-02-04 DIAGNOSIS — E042 Nontoxic multinodular goiter: Secondary | ICD-10-CM | POA: Diagnosis not present

## 2018-02-04 DIAGNOSIS — Z0001 Encounter for general adult medical examination with abnormal findings: Secondary | ICD-10-CM | POA: Diagnosis not present

## 2018-02-04 DIAGNOSIS — R7303 Prediabetes: Secondary | ICD-10-CM | POA: Diagnosis not present

## 2018-02-04 DIAGNOSIS — Z0000 Encounter for general adult medical examination without abnormal findings: Secondary | ICD-10-CM | POA: Diagnosis not present

## 2018-02-04 DIAGNOSIS — R292 Abnormal reflex: Secondary | ICD-10-CM | POA: Diagnosis not present

## 2018-02-17 DIAGNOSIS — R748 Abnormal levels of other serum enzymes: Secondary | ICD-10-CM | POA: Diagnosis not present

## 2018-02-18 DIAGNOSIS — E1165 Type 2 diabetes mellitus with hyperglycemia: Secondary | ICD-10-CM | POA: Diagnosis not present

## 2018-02-18 DIAGNOSIS — R748 Abnormal levels of other serum enzymes: Secondary | ICD-10-CM | POA: Diagnosis not present

## 2018-02-18 DIAGNOSIS — E042 Nontoxic multinodular goiter: Secondary | ICD-10-CM | POA: Diagnosis not present

## 2018-02-27 DIAGNOSIS — R748 Abnormal levels of other serum enzymes: Secondary | ICD-10-CM | POA: Diagnosis not present

## 2018-03-11 ENCOUNTER — Encounter: Payer: Self-pay | Admitting: Dietician

## 2018-03-11 ENCOUNTER — Encounter: Payer: Medicare Other | Attending: Endocrinology | Admitting: Dietician

## 2018-03-11 DIAGNOSIS — E119 Type 2 diabetes mellitus without complications: Secondary | ICD-10-CM | POA: Diagnosis not present

## 2018-03-11 DIAGNOSIS — Z713 Dietary counseling and surveillance: Secondary | ICD-10-CM | POA: Diagnosis not present

## 2018-03-11 DIAGNOSIS — Z6829 Body mass index (BMI) 29.0-29.9, adult: Secondary | ICD-10-CM | POA: Insufficient documentation

## 2018-03-11 NOTE — Progress Notes (Signed)
Patient was seen on 03/11/18 for the first of a series of three diabetes self-management courses at the [facility].  Patient Education Plan per assessed needs and concerns is to attend three course education program for Diabetes Self Management Education.  The following learning objectives were met by the patient during this class:  Describe diabetes  State some common risk factors for diabetes  Defines the role of glucose and insulin  Identifies type of diabetes and pathophysiology  Describe the relationship between diabetes and cardiovascular risk  State the members of the Healthcare Team  States the rationale for glucose monitoring  State when to test glucose  State their individual Target Range  State the importance of logging glucose readings  Describe how to interpret glucose readings  Identifies A1C target  Explain the correlation between A1c and eAG values  State symptoms and treatment of high blood glucose  State symptoms and treatment of low blood glucose  Explain proper technique for glucose testing  Identifies proper sharps disposal  Handouts given during class include:  ADA Diabetes You Take Control   Carb Counting and Meal Planning book  Meal Plan Card  Meal planning worksheet  Low Sodium Flavoring Tips  Types of Fats  The diabetes portion plate  M8U to eAG Conversion Chart  Diabetes Recommended Care Schedule  Support Group  Diabetes Success Plan  Core Class Satisfaction Survey   Follow-Up Plan:  Attend core 2

## 2018-03-18 ENCOUNTER — Encounter: Payer: Medicare Other | Admitting: Dietician

## 2018-03-18 ENCOUNTER — Encounter: Payer: Self-pay | Admitting: Dietician

## 2018-03-18 DIAGNOSIS — Z713 Dietary counseling and surveillance: Secondary | ICD-10-CM | POA: Diagnosis not present

## 2018-03-18 DIAGNOSIS — E119 Type 2 diabetes mellitus without complications: Secondary | ICD-10-CM | POA: Diagnosis not present

## 2018-03-18 NOTE — Progress Notes (Signed)
Patient was seen on 03/18/18 for the second of a series of three diabetes self-management courses at the [facility]. The following learning objectives were met by the patient during this class:   Describe the role of different macronutrients on glucose  Explain how carbohydrates affect blood glucose  State what foods contain the most carbohydrates  Demonstrate carbohydrate counting  Demonstrate how to read Nutrition Facts food label  Describe effects of various fats on heart health  Describe the importance of good nutrition for health and healthy eating strategies  Describe techniques for managing your shopping, cooking and meal planning  List strategies to follow meal plan when dining out  Describe the effects of alcohol on glucose and how to use it safely  Goals:  Follow Diabetes Meal Plan as instructed  Aim to spread carbs evenly throughout the day  Aim for 3 meals per day and snacks as needed Include lean protein foods to meals/snacks  Monitor glucose levels as instructed by your doctor   Follow-Up Plan:  Attend Core 3  Work towards following your personal food plan.   

## 2018-03-25 ENCOUNTER — Encounter: Payer: Medicare Other | Attending: Endocrinology | Admitting: Dietician

## 2018-03-25 DIAGNOSIS — Z6829 Body mass index (BMI) 29.0-29.9, adult: Secondary | ICD-10-CM | POA: Diagnosis not present

## 2018-03-25 DIAGNOSIS — Z713 Dietary counseling and surveillance: Secondary | ICD-10-CM | POA: Insufficient documentation

## 2018-03-25 DIAGNOSIS — E119 Type 2 diabetes mellitus without complications: Secondary | ICD-10-CM

## 2018-03-25 NOTE — Progress Notes (Signed)
Patient was seen on 03/25/18 for the third of a series of three diabetes self-management courses at the [facility].   Catalina Gravel the amount of activity recommended for healthy living . Describe activities suitable for individual needs . Identify ways to regularly incorporate activity into daily life . Identify barriers to activity and ways to over come these barriers  Identify diabetes medications being personally used and their primary action for lowering glucose and possible side effects . Describe role of stress on blood glucose and develop strategies to address psychosocial issues . Identify diabetes complications and ways to prevent them  Explain how to manage diabetes during illness . Evaluate success in meeting personal goal . Establish 2-3 goals that they will plan to diligently work on  Goals:   I will be active more often throughout the week  I will eat less unhealthy fats.  I will find activities to help manage stress.  Your patient has identified these potential barriers to change:  Mother moving in with her for a month.  Your patient has identified their diabetes self-care support plan as  On-line Resources    Plan:  Attend Support Group as desired

## 2018-03-28 DIAGNOSIS — C50919 Malignant neoplasm of unspecified site of unspecified female breast: Secondary | ICD-10-CM | POA: Diagnosis not present

## 2018-03-28 DIAGNOSIS — Z1231 Encounter for screening mammogram for malignant neoplasm of breast: Secondary | ICD-10-CM | POA: Diagnosis not present

## 2018-03-28 DIAGNOSIS — M810 Age-related osteoporosis without current pathological fracture: Secondary | ICD-10-CM | POA: Diagnosis not present

## 2018-03-28 DIAGNOSIS — Z01419 Encounter for gynecological examination (general) (routine) without abnormal findings: Secondary | ICD-10-CM | POA: Diagnosis not present

## 2018-04-02 DIAGNOSIS — M25511 Pain in right shoulder: Secondary | ICD-10-CM | POA: Diagnosis not present

## 2018-04-02 DIAGNOSIS — M7541 Impingement syndrome of right shoulder: Secondary | ICD-10-CM | POA: Diagnosis not present

## 2018-05-06 DIAGNOSIS — M8580 Other specified disorders of bone density and structure, unspecified site: Secondary | ICD-10-CM | POA: Diagnosis not present

## 2018-05-27 DIAGNOSIS — M25511 Pain in right shoulder: Secondary | ICD-10-CM | POA: Diagnosis not present

## 2018-05-30 DIAGNOSIS — M7501 Adhesive capsulitis of right shoulder: Secondary | ICD-10-CM | POA: Diagnosis not present

## 2018-05-30 DIAGNOSIS — M75101 Unspecified rotator cuff tear or rupture of right shoulder, not specified as traumatic: Secondary | ICD-10-CM | POA: Diagnosis not present

## 2018-05-30 DIAGNOSIS — M25511 Pain in right shoulder: Secondary | ICD-10-CM | POA: Diagnosis not present

## 2018-05-31 ENCOUNTER — Other Ambulatory Visit: Payer: Self-pay | Admitting: Endocrinology

## 2018-05-31 DIAGNOSIS — E042 Nontoxic multinodular goiter: Secondary | ICD-10-CM

## 2018-06-09 ENCOUNTER — Ambulatory Visit
Admission: RE | Admit: 2018-06-09 | Discharge: 2018-06-09 | Disposition: A | Discharge status: Home / Self Care | Payer: Medicare Other | Source: Ambulatory Visit | Attending: Endocrinology | Admitting: Endocrinology

## 2018-06-09 DIAGNOSIS — E041 Nontoxic single thyroid nodule: Secondary | ICD-10-CM | POA: Diagnosis not present

## 2018-06-09 DIAGNOSIS — E042 Nontoxic multinodular goiter: Secondary | ICD-10-CM

## 2018-06-11 DIAGNOSIS — J988 Other specified respiratory disorders: Secondary | ICD-10-CM | POA: Diagnosis not present

## 2018-06-12 DIAGNOSIS — E042 Nontoxic multinodular goiter: Secondary | ICD-10-CM | POA: Diagnosis not present

## 2018-06-12 DIAGNOSIS — E1165 Type 2 diabetes mellitus with hyperglycemia: Secondary | ICD-10-CM | POA: Diagnosis not present

## 2018-06-17 DIAGNOSIS — M75101 Unspecified rotator cuff tear or rupture of right shoulder, not specified as traumatic: Secondary | ICD-10-CM | POA: Diagnosis not present

## 2018-06-17 DIAGNOSIS — M7501 Adhesive capsulitis of right shoulder: Secondary | ICD-10-CM | POA: Diagnosis not present

## 2018-06-20 DIAGNOSIS — E1165 Type 2 diabetes mellitus with hyperglycemia: Secondary | ICD-10-CM | POA: Diagnosis not present

## 2018-06-20 DIAGNOSIS — E7800 Pure hypercholesterolemia, unspecified: Secondary | ICD-10-CM | POA: Diagnosis not present

## 2018-06-20 DIAGNOSIS — E059 Thyrotoxicosis, 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: Secondary | ICD-10-CM | POA: Diagnosis not present

## 2018-06-20 DIAGNOSIS — E042 Nontoxic multinodular goiter: Secondary | ICD-10-CM | POA: Diagnosis not present

## 2018-06-24 DIAGNOSIS — E1165 Type 2 diabetes mellitus with hyperglycemia: Secondary | ICD-10-CM | POA: Diagnosis not present

## 2018-06-24 DIAGNOSIS — E7800 Pure hypercholesterolemia, unspecified: Secondary | ICD-10-CM | POA: Diagnosis not present

## 2018-06-24 DIAGNOSIS — Z01818 Encounter for other preprocedural examination: Secondary | ICD-10-CM | POA: Diagnosis not present

## 2018-07-01 IMAGING — CT CT MAXILLOFACIAL W/O CM
2 series · 16 of 30 positions shown, 19 images · non-contrast
Comparison: Head CT 09/06/2016

CLINICAL DATA: Chronic sinusitis.

EXAM:
CT MAXILLOFACIAL WITHOUT CONTRAST
TECHNIQUE: Multidetector CT imaging of the maxillofacial structures was
performed. Multiplanar CT image reconstructions were also generated.
A small metallic BB was placed on the right temple in order to
reliably differentiate right from left.

[Series 3: axial soft 1.25 · axial · 0.49mm/px · z∈[-42,+63]mm · 6 of 143 slices shown]
[im 17/143  brain]
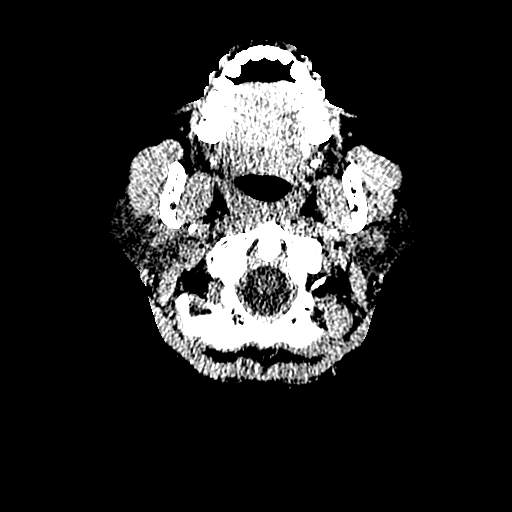
[im 34/143  brain]
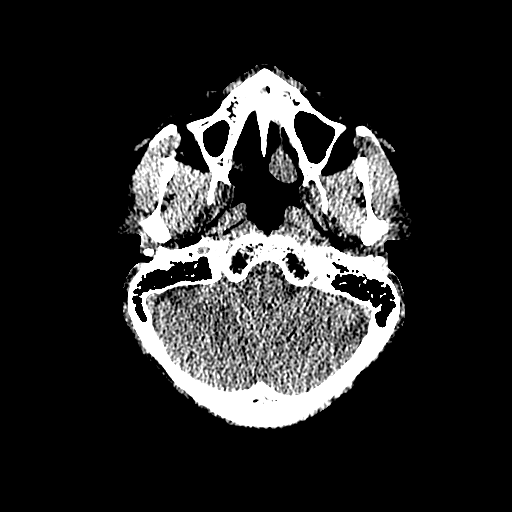
[im 51/143  brain]
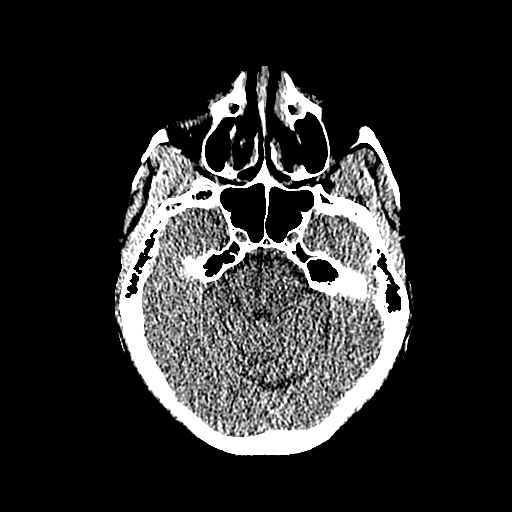
[im 67/143  brain]
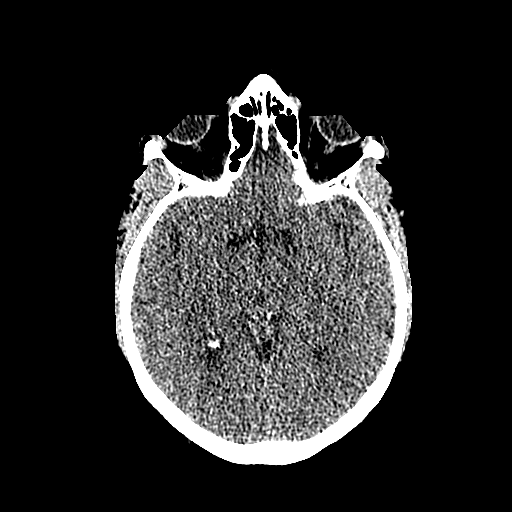
[im 84/143  brain]
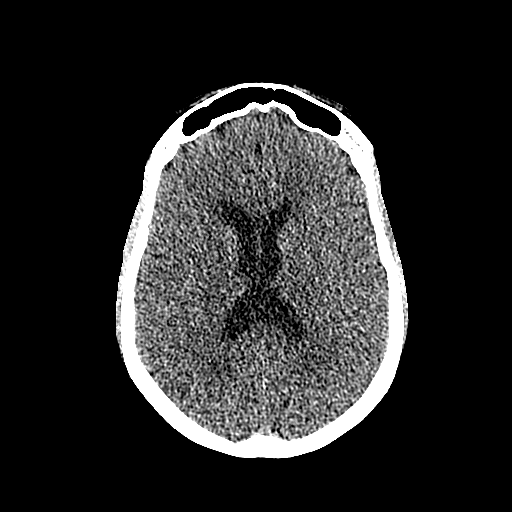
[im 101/143  brain]
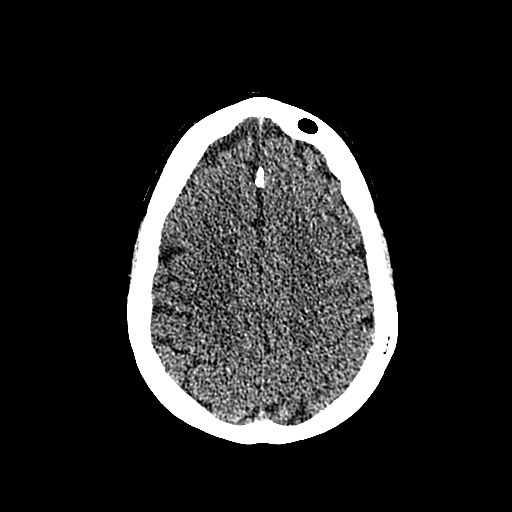

[Series 602: sagittal facial · sagittal · 0.49mm/px · 10 of 91 slices shown, 13 images]
[im 9/91  brain]
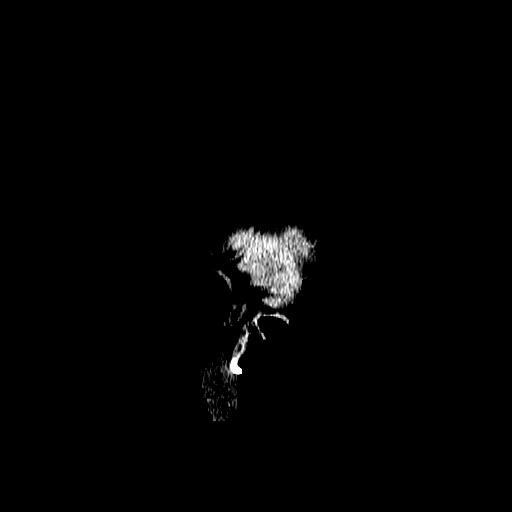
[im 9/91  bone]
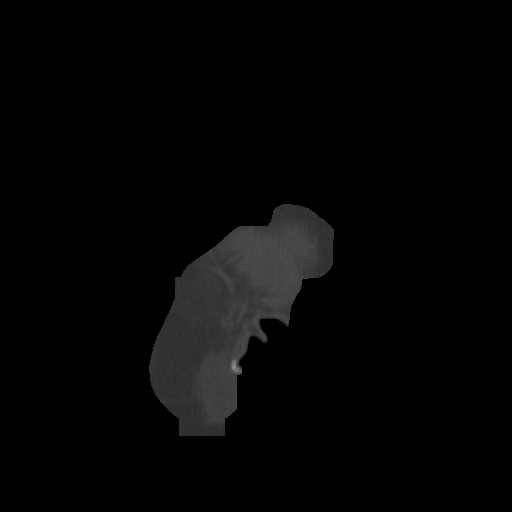
[im 17/91  bone]
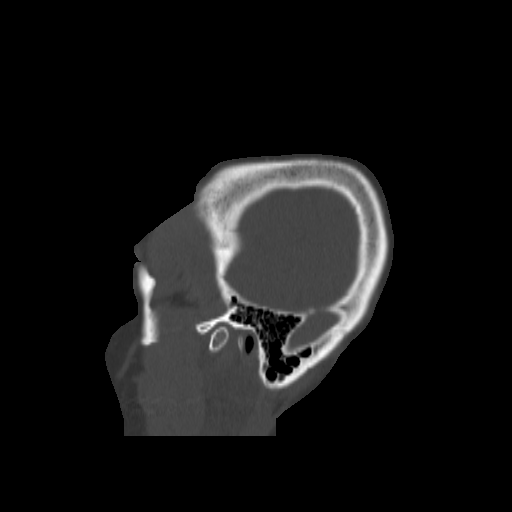
[im 25/91  bone]
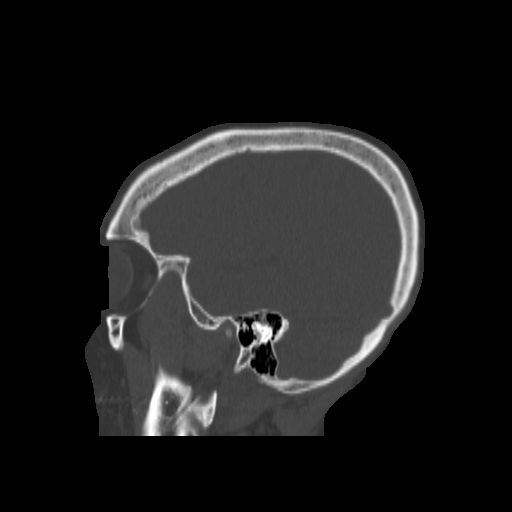
[im 33/91  bone]
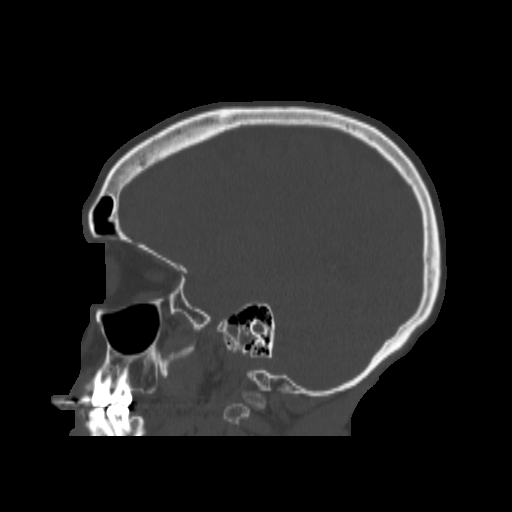
[im 41/91  brain]
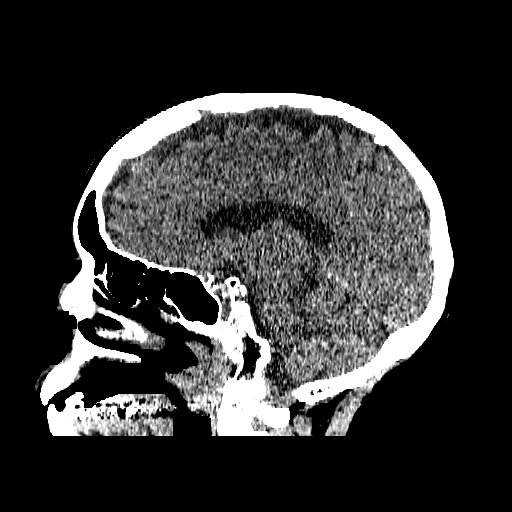
[im 41/91  bone]
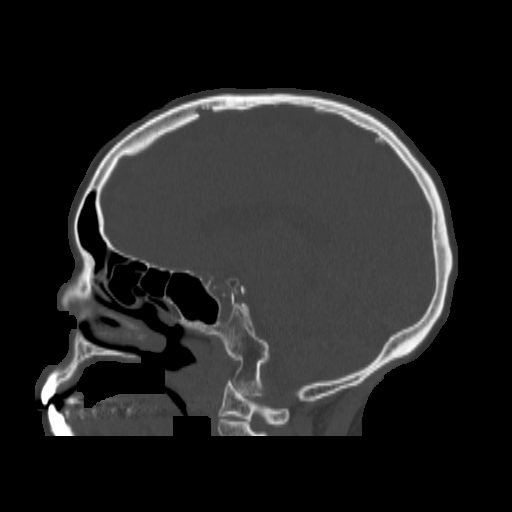
[im 50/91  bone]
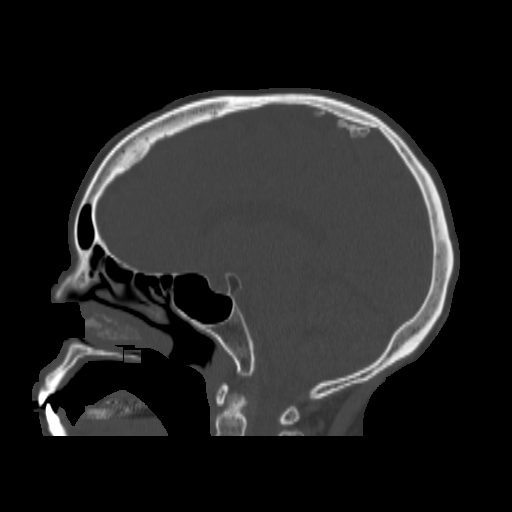
[im 58/91  bone]
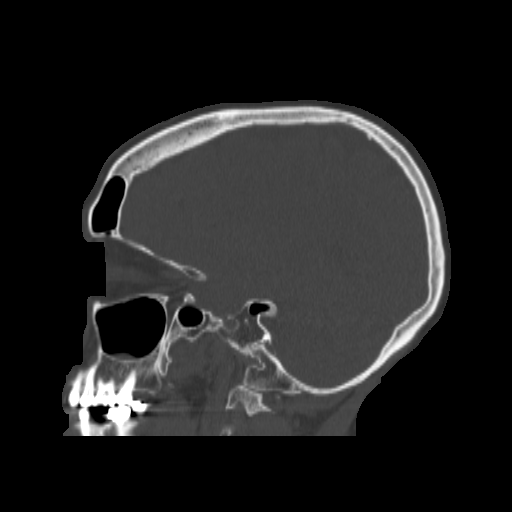
[im 66/91  bone]
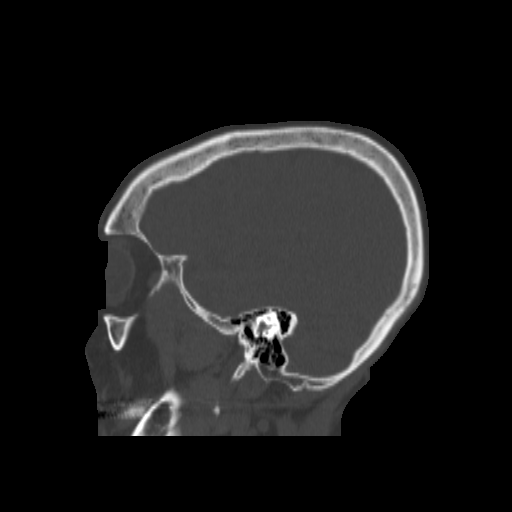
[im 74/91  brain]
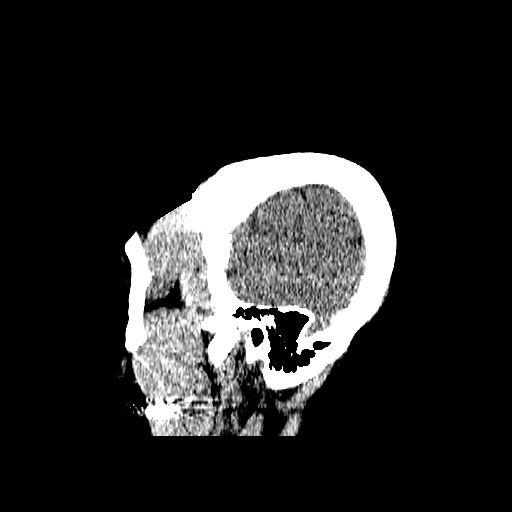
[im 74/91  bone]
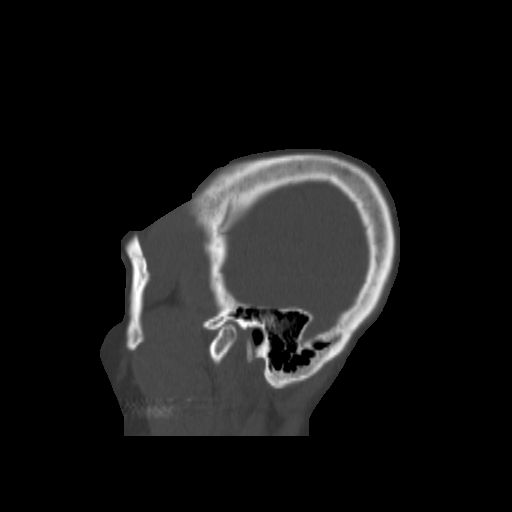
[im 82/91  bone]
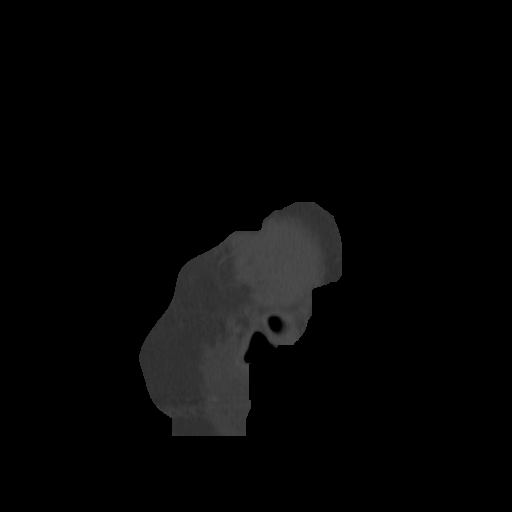

[16 of 30 positions shown; findings below may reference images not displayed]

FINDINGS: There is minimal mucosal thickening in the alveolar recess of the
left maxillary sinus. Minimal debris or secretions are noted in the
right sphenoid sinus. The other paranasal sinuses are clear. There
are no fluid levels. The frontal sinuses are large. The ostiomeatal
complexes, frontal recesses, and sphenoethmoidal recesses are
patent.

There is left inferior turbinate hypertrophy. The nasal septum is
midline. The mastoid air cells and middle ear cavities are clear
bilaterally. The orbits and brain are unremarkable.
IMPRESSION: 1. Minimal left maxillary sinus mucosal thickening.  No fluid.
2. Left inferior nasal turbinate hypertrophy.

## 2018-07-02 DIAGNOSIS — R079 Chest pain, unspecified: Secondary | ICD-10-CM | POA: Diagnosis not present

## 2018-07-02 DIAGNOSIS — I447 Left bundle-branch block, unspecified: Secondary | ICD-10-CM | POA: Diagnosis not present

## 2018-07-02 DIAGNOSIS — E119 Type 2 diabetes mellitus without complications: Secondary | ICD-10-CM | POA: Diagnosis not present

## 2018-07-02 DIAGNOSIS — Z01810 Encounter for preprocedural cardiovascular examination: Secondary | ICD-10-CM | POA: Diagnosis not present

## 2018-07-07 DIAGNOSIS — R5383 Other fatigue: Secondary | ICD-10-CM | POA: Diagnosis not present

## 2018-07-07 DIAGNOSIS — J0100 Acute maxillary sinusitis, unspecified: Secondary | ICD-10-CM | POA: Diagnosis not present

## 2018-07-07 DIAGNOSIS — E1165 Type 2 diabetes mellitus with hyperglycemia: Secondary | ICD-10-CM | POA: Diagnosis not present

## 2018-07-08 ENCOUNTER — Ambulatory Visit (INDEPENDENT_AMBULATORY_CARE_PROVIDER_SITE_OTHER): Payer: Medicare Other | Admitting: Podiatry

## 2018-07-08 ENCOUNTER — Encounter: Payer: Self-pay | Admitting: Podiatry

## 2018-07-08 VITALS — BP 138/75 | HR 71

## 2018-07-08 DIAGNOSIS — E119 Type 2 diabetes mellitus without complications: Secondary | ICD-10-CM | POA: Diagnosis not present

## 2018-07-08 DIAGNOSIS — M2041 Other hammer toe(s) (acquired), right foot: Secondary | ICD-10-CM | POA: Diagnosis not present

## 2018-07-08 DIAGNOSIS — M2010 Hallux valgus (acquired), unspecified foot: Secondary | ICD-10-CM | POA: Diagnosis not present

## 2018-07-08 DIAGNOSIS — M2042 Other hammer toe(s) (acquired), left foot: Secondary | ICD-10-CM | POA: Diagnosis not present

## 2018-07-08 NOTE — Progress Notes (Signed)
This patient presents to the office with chief complaint of long thick nails and diabetic feet. Patient is a newly diagnosed diabetic. This patient  says there  is  no pain and discomfort in their feet.  This patient says there are long thick painful nails.  These nails are painful walking and wearing shoes.  Patient has no history of infection or drainage from both feet.  Patient is unable to  self treat his own nails . This patient presents  to the office today for treatment of the  long nails and a foot evaluation due to history of  diabetes.  General Appearance  Alert, conversant and in no acute stress.  Vascular  Dorsalis pedis and posterior tibial  pulses are palpable  bilaterally.  Capillary return is within normal limits  bilaterally. Temperature is within normal limits  bilaterally.  Neurologic  Senn-Weinstein monofilament wire test within normal limits  bilaterally. Muscle power within normal limits bilaterally.  Nails Normal nails noted with  no evidence of bacterial infection or drainage bilaterally.  Orthopedic  No limitations of motion of motion feet .  No crepitus or effusions noted.  HAV  B/L asymptomatic.  Hammer toes 2-5  B/L asymptomatic.  Skin  normotropic skin with no porokeratosis noted bilaterally.  No signs of infections or ulcers noted.     Onychomycosis  Diabetes with no foot complications  IE  Debride nails x 10.  A diabetic foot exam was performed and there is no evidence of any vascular or neurologic pathology.   RTC 3 months.   Gardiner Barefoot DPM

## 2018-07-29 DIAGNOSIS — G8929 Other chronic pain: Secondary | ICD-10-CM | POA: Diagnosis not present

## 2018-07-29 DIAGNOSIS — M25511 Pain in right shoulder: Secondary | ICD-10-CM | POA: Diagnosis not present

## 2018-07-29 DIAGNOSIS — Z23 Encounter for immunization: Secondary | ICD-10-CM | POA: Diagnosis not present

## 2018-07-31 DIAGNOSIS — E119 Type 2 diabetes mellitus without complications: Secondary | ICD-10-CM | POA: Diagnosis not present

## 2018-07-31 DIAGNOSIS — I447 Left bundle-branch block, unspecified: Secondary | ICD-10-CM | POA: Diagnosis not present

## 2018-07-31 DIAGNOSIS — Z01810 Encounter for preprocedural cardiovascular examination: Secondary | ICD-10-CM | POA: Diagnosis not present

## 2018-07-31 DIAGNOSIS — R079 Chest pain, unspecified: Secondary | ICD-10-CM | POA: Diagnosis not present

## 2018-08-18 ENCOUNTER — Inpatient Hospital Stay: Payer: Medicare Other | Attending: Hematology and Oncology | Admitting: Hematology and Oncology

## 2018-08-18 NOTE — Assessment & Plan Note (Deleted)
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 Breast exam  08/18/2018: Normal 2. Mammogram 02/07/2017 No abnormalities. Postsurgical changes. Breast Density Category B. 3. Breast MRI05/11/2016: No evidence of breast malignancy  BRCA2 mutation: I recommended annual breast MRIs in addition to mammograms. We would like to alternate them by 6 months apart. However patient has not done either her mammogram or breast MRI this year.

## 2018-08-21 ENCOUNTER — Ambulatory Visit: Payer: Self-pay | Admitting: Orthopedic Surgery

## 2018-08-25 ENCOUNTER — Ambulatory Visit: Payer: Self-pay | Admitting: Orthopedic Surgery

## 2018-08-25 NOTE — H&P (View-Only) (Signed)
Jordan Willis is an 67 y.o. female.   Chief Complaint: R shoulder pain HPI: Visit For: Follow up Location: right; shoulder Duration: The patient is 4 1/2 onths out from when symptoms began. Severity: pain level 7/10 Notes: The patient is taking Ibuprofen for pain.  Past Medical History:  Diagnosis Date  . Arthritis    Lumbar spine DDD.  Marland Kitchen Breast cancer, female, left 03/03/2012  . 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 Diabetes mellitus without complication ([place])   . Dyspnea    When patient gets sick uses Pro-Air inhaler  . Frequent sinus infections   . History of kidney stones    passed stone - no surgery  . Hyperlipidemia   . Menopause 03/03/2012  . Personal history of chemotherapy   . Personal history of radiation therapy   . Sinusitis 2014  . SVD (spontaneous vaginal delivery)    x 2    Past Surgical History:  Procedure Laterality Date  . BILATERAL SALPINGOOPHORECTOMY Bilateral   . BLADDER SUSPENSION N/A 05/16/2017   Procedure: TRANSVAGINAL TAPE (TVT) PROCEDURE;  Surgeon: Everett Graff, MD;  Location: [facility];  Service: Gynecology;  Laterality: N/A;  . BREAST BIOPSY Right 2009  . BREAST LUMPECTOMY Right 2009  . BREAST LUMPECTOMY Left 2006  . BREAST SURGERY Left 2006   invasive mammary cancer/high grade ductal carcinoma; chemotherapy and radiation  . BREAST SURGERY Right 2009   followed by radiation  . COLONOSCOPY  10/22/2009   Normal.  Repeat 5 years.  Nat [name]  . CYSTOSCOPY N/A 05/16/2017   Procedure: CYSTOSCOPY;  Surgeon: Everett Graff, MD;  Location: [facility];  Service: Gynecology;  Laterality: N/A;  . ROBOTIC ASSISTED TOTAL HYSTERECTOMY N/A 05/16/2017   Procedure: ROBOTIC ASSISTED TOTAL HYSTERECTOMY;  Surgeon: Delsa Bern, MD;  Location: [facility];  Service: Gynecology;  Laterality: N/A;  . TUBAL LIGATION    . WISDOM TOOTH EXTRACTION      Family History  Problem Relation Age of Onset  . Cancer Mother 68       breast cancer  . Hypertension  Mother   . Diabetes Father   . Cancer Sister        breast cancer  . Breast cancer Sister   . Cancer Sister        breast cancer  . Breast cancer Sister    Social History:  reports that she has never smoked. She has never used smokeless tobacco. She reports that she does not drink alcohol or use drugs.  Allergies:  Allergies  Allergen Reactions  . Aspirin Palpitations and Other (See Comments)    Heart fluttering in high doses.  Patient can take low dose 81 mg.  Heart fluttering Heart fluttering   . Tape Rash   Medications atorvastatin 10 mg tablet azithromycin 250 mg tablet ciprofloxacin 250 mg tablet ibuprofen 600 mg tablet ketorolac 0.5 % eye drops ondansetron HCl 4 mg tablet oxyCODONE-acetaminophen 5 mg-325 mg tablet sertraline 50 mg tablet traZODone 50 mg tablet Vesicare 10 mg tablet  Review of Systems  Constitutional: Negative.   HENT: Negative.   Eyes: Negative.   Respiratory: Negative.   Cardiovascular: Negative.   Gastrointestinal: Negative.   Genitourinary: Negative.   Musculoskeletal: Positive for joint pain.  Skin: Negative.   Neurological: Negative.   Psychiatric/Behavioral: Negative.     There were no vitals taken for this visit. Physical Exam  Constitutional: She is oriented to person, place, and time. She appears well-developed.  HENT:  Head:  Normocephalic.  Eyes: Pupils are equal, round, and reactive to light.  Neck: Normal range of motion.  Cardiovascular: Normal rate.  Respiratory: Effort normal.  GI: Soft.  Musculoskeletal:  Patient is a 67 year old female.  Constitutional: General Appearance: healthy-appearing and NAD.  Psychiatric: Mood and Affect: normal mood and affect.  Cardiovascular System: Arterial Pulses Right: radial normal and brachial normal. Varicosities Right: no varicosities.  C-Spine/Neck: Active Range of Motion: flexion normal, extension normal, and no pain elicited on motion.  Shoulders: Inspection Right: no  misalignment, atrophy, erythema, swelling, or scapular winging. Bony Palpation Right: no tenderness of the sternoclavicular joint, the coracoid process, the acromioclavicular joint, the bicipital groove, or the scapula. Soft Tissue Palpation Right: tenderness of the supraspinatus and the subacromial bursa. Active Range of Motion Right: limited. Special Tests Right: Speed's test negative and Neer's test positive. Stability Right: no laxity, sulcus sign negative, and anterior apprehension test negative. Strength Right: abduction 5/5, adduction 5/5, flexion 5/5, and extension 5/5.  Skin: Right Upper Extremity: normal.  Neurological System: Biceps Reflex Right: normal (2). Brachioradialis Reflex Right: normal (2). Triceps Reflex Right: normal (2). Sensation on the Right: C5 normal, C6 normal, and C7 normal.  Patient has decreased internal rotation as well as abduction.  Neurological: She is alert and oriented to person, place, and time.  Skin: Skin is warm and dry.     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 This has been refractory conservative treatment.  We discussed living with her symptoms versus exam followed by manipulation under anesthesia followed by mini open rotator cuff repair and subacromial decompression.  She would like to proceed with the latter given her persistent disabling symptoms.  An extensive discussion concerning the pathology relevant anatomy and treatment options. After that discussion we mutually agreed to proceed with repair of the rotator cuff utilizing arthroscopic assistance if possible. The risks and benefits of that procedure were discussed including bleeding, infection, suboptimal range of motion, deep venous thrombosis, pulmonary embolism, anesthetic complications etc. in addition we discussed the postoperative course to include approximately 4 weeks of passive range of motion followed by 4  weeks of active range of motion followed by 4-12 weeks of progressive strengthening exercises. In addition we discussed protective activities to reduce the risk of a reinjury including impingement activities with elbow above the shoulder as well as reaching and repetitive circular motion activities. The hospital stay will either be as a outpatient with a regional block versus overnight depending upon the extent of the procedure and any challenging health issues with a first postoperative visit 2 weeks following the surgery. Currently request preoperative clearance by Dr. Shelia Media who she will be seen in the next week.  No history of DVT. Okay to utilize Kefzol. We discussed  Plan R shoulder MUA, EUA, mini-open RCR, SAD  BISSELL, JACLYN M., PA-C for Dr. Tonita Cong 08/25/2018, 11:22 AM

## 2018-08-25 NOTE — H&P (Signed)
Jordan Willis is an 67 y.o. female.   Chief Complaint: R shoulder pain HPI: Visit For: Follow up Location: right; shoulder Duration: The patient is 4 1/2 onths out from when symptoms began. Severity: pain level 7/10 Notes: The patient is taking Ibuprofen for pain.  Past Medical History:  Diagnosis Date  . Arthritis    Lumbar spine DDD.  Marland Kitchen Breast cancer, female, left 03/03/2012  . 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 Diabetes mellitus without complication ([place])   . Dyspnea    When patient gets sick uses Pro-Air inhaler  . Frequent sinus infections   . History of kidney stones    passed stone - no surgery  . Hyperlipidemia   . Menopause 03/03/2012  . Personal history of chemotherapy   . Personal history of radiation therapy   . Sinusitis 2014  . SVD (spontaneous vaginal delivery)    x 2    Past Surgical History:  Procedure Laterality Date  . BILATERAL SALPINGOOPHORECTOMY Bilateral   . BLADDER SUSPENSION N/A 05/16/2017   Procedure: TRANSVAGINAL TAPE (TVT) PROCEDURE;  Surgeon: Everett Graff, MD;  Location: [facility];  Service: Gynecology;  Laterality: N/A;  . BREAST BIOPSY Right 2009  . BREAST LUMPECTOMY Right 2009  . BREAST LUMPECTOMY Left 2006  . BREAST SURGERY Left 2006   invasive mammary cancer/high grade ductal carcinoma; chemotherapy and radiation  . BREAST SURGERY Right 2009   followed by radiation  . COLONOSCOPY  10/22/2009   Normal.  Repeat 5 years.  Nat [name]  . CYSTOSCOPY N/A 05/16/2017   Procedure: CYSTOSCOPY;  Surgeon: Everett Graff, MD;  Location: [facility];  Service: Gynecology;  Laterality: N/A;  . ROBOTIC ASSISTED TOTAL HYSTERECTOMY N/A 05/16/2017   Procedure: ROBOTIC ASSISTED TOTAL HYSTERECTOMY;  Surgeon: Delsa Bern, MD;  Location: [facility];  Service: Gynecology;  Laterality: N/A;  . TUBAL LIGATION    . WISDOM TOOTH EXTRACTION      Family History  Problem Relation Age of Onset  . Cancer Mother 1       breast cancer  . Hypertension  Mother   . Diabetes Father   . Cancer Sister        breast cancer  . Breast cancer Sister   . Cancer Sister        breast cancer  . Breast cancer Sister    Social History:  reports that she has never smoked. She has never used smokeless tobacco. She reports that she does not drink alcohol or use drugs.  Allergies:  Allergies  Allergen Reactions  . Aspirin Palpitations and Other (See Comments)    Heart fluttering in high doses.  Patient can take low dose 81 mg.  Heart fluttering Heart fluttering   . Tape Rash   Medications atorvastatin 10 mg tablet azithromycin 250 mg tablet ciprofloxacin 250 mg tablet ibuprofen 600 mg tablet ketorolac 0.5 % eye drops ondansetron HCl 4 mg tablet oxyCODONE-acetaminophen 5 mg-325 mg tablet sertraline 50 mg tablet traZODone 50 mg tablet Vesicare 10 mg tablet  Review of Systems  Constitutional: Negative.   HENT: Negative.   Eyes: Negative.   Respiratory: Negative.   Cardiovascular: Negative.   Gastrointestinal: Negative.   Genitourinary: Negative.   Musculoskeletal: Positive for joint pain.  Skin: Negative.   Neurological: Negative.   Psychiatric/Behavioral: Negative.     There were no vitals taken for this visit. Physical Exam  Constitutional: She is oriented to person, place, and time. She appears well-developed.  HENT:  Head:  Normocephalic.  Eyes: Pupils are equal, round, and reactive to light.  Neck: Normal range of motion.  Cardiovascular: Normal rate.  Respiratory: Effort normal.  GI: Soft.  Musculoskeletal:  Patient is a 67 year old female.  Constitutional: General Appearance: healthy-appearing and NAD.  Psychiatric: Mood and Affect: normal mood and affect.  Cardiovascular System: Arterial Pulses Right: radial normal and brachial normal. Varicosities Right: no varicosities.  C-Spine/Neck: Active Range of Motion: flexion normal, extension normal, and no pain elicited on motion.  Shoulders: Inspection Right: no  misalignment, atrophy, erythema, swelling, or scapular winging. Bony Palpation Right: no tenderness of the sternoclavicular joint, the coracoid process, the acromioclavicular joint, the bicipital groove, or the scapula. Soft Tissue Palpation Right: tenderness of the supraspinatus and the subacromial bursa. Active Range of Motion Right: limited. Special Tests Right: Speed's test negative and Neer's test positive. Stability Right: no laxity, sulcus sign negative, and anterior apprehension test negative. Strength Right: abduction 5/5, adduction 5/5, flexion 5/5, and extension 5/5.  Skin: Right Upper Extremity: normal.  Neurological System: Biceps Reflex Right: normal (2). Brachioradialis Reflex Right: normal (2). Triceps Reflex Right: normal (2). Sensation on the Right: C5 normal, C6 normal, and C7 normal.  Patient has decreased internal rotation as well as abduction.  Neurological: She is alert and oriented to person, place, and time.  Skin: Skin is warm and dry.     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 This has been refractory conservative treatment.  We discussed living with her symptoms versus exam followed by manipulation under anesthesia followed by mini open rotator cuff repair and subacromial decompression.  She would like to proceed with the latter given her persistent disabling symptoms.  An extensive discussion concerning the pathology relevant anatomy and treatment options. After that discussion we mutually agreed to proceed with repair of the rotator cuff utilizing arthroscopic assistance if possible. The risks and benefits of that procedure were discussed including bleeding, infection, suboptimal range of motion, deep venous thrombosis, pulmonary embolism, anesthetic complications etc. in addition we discussed the postoperative course to include approximately 4 weeks of passive range of motion followed by 4  weeks of active range of motion followed by 4-12 weeks of progressive strengthening exercises. In addition we discussed protective activities to reduce the risk of a reinjury including impingement activities with elbow above the shoulder as well as reaching and repetitive circular motion activities. The hospital stay will either be as a outpatient with a regional block versus overnight depending upon the extent of the procedure and any challenging health issues with a first postoperative visit 2 weeks following the surgery. Currently request preoperative clearance by Dr. Shelia Media who she will be seen in the next week.  No history of DVT. Okay to utilize Kefzol. We discussed  Plan R shoulder MUA, EUA, mini-open RCR, SAD  BISSELL, JACLYN M., PA-C for Dr. Tonita Cong 08/25/2018, 11:22 AM

## 2018-08-27 ENCOUNTER — Encounter (HOSPITAL_COMMUNITY): Payer: Self-pay

## 2018-08-27 NOTE — Patient Instructions (Addendum)
Your procedure is scheduled on: Wednesday, Nov. 13, 2019   Surgery Time:  8:30AM-10:00AM   Report to [facility]  Entrance    Report to admitting at  6:30 AM   Call this number if you have problems the morning of surgery [phone]   Do not eat food or drink liquids :After Midnight.   Brush your teeth the morning of surgery.   Do NOT smoke after Midnight   Take these medicines the morning of surgery with A SIP OF WATER:  Atorvastatin, Cetirizine, Sertraline, Vesicare   Bring Asthma Inhaler day of surgery  DO NOT TAKE ANY DIABETIC MEDICATIONS DAY OF YOUR SURGERY                               You may not have any metal on your body including hair pins, jewelry, and body piercings             Do not wear make-up, lotions, powders, perfumes/cologne, or deodorant             Do not wear nail polish.  Do not shave  48 hours prior to surgery.                Do not bring valuables to the hospital. [place].   Contacts, dentures or bridgework may not be worn into surgery.   Leave suitcase in the car. After surgery it may be brought to your room.    Special Instructions: Bring a copy of your healthcare power of attorney and living will documents         the day of surgery if you haven't scanned them in before.              Please read over the following fact sheets you were given:  [facility] - Preparing for Surgery Before surgery, you can play an important role.  Because skin is not sterile, your skin needs to be as free of germs as possible.  You can reduce the number of germs on your skin by washing with CHG (chlorahexidine gluconate) soap before surgery.  CHG is an antiseptic cleaner which kills germs and bonds with the skin to continue killing germs even after washing. Please DO NOT use if you have an allergy to CHG or antibacterial soaps.  If your skin becomes reddened/irritated stop using the CHG and inform  your nurse when you arrive at Short Stay. Do not shave (including legs and underarms) for at least 48 hours prior to the first CHG shower.  You may shave your face/neck.  Please follow these instructions carefully:  1.  Shower with CHG Soap the night before surgery and the  morning of surgery.  2.  If you choose to wash your hair, wash your hair first as usual with your normal  shampoo.  3.  After you shampoo, rinse your hair and body thoroughly to remove the shampoo.                             4.  Use CHG as you would any other liquid soap.  You can apply chg directly to the skin and wash.  Gently with a scrungie or clean washcloth.  5.  Apply the CHG Soap to your  body ONLY FROM THE NECK DOWN.   Do   not use on face/ open                           Wound or open sores. Avoid contact with eyes, ears mouth and   genitals (private parts).                       Wash face,  Genitals (private parts) with your normal soap.             6.  Wash thoroughly, paying special attention to the area where your    surgery  will be performed.  7.  Thoroughly rinse your body with warm water from the neck down.  8.  DO NOT shower/wash with your normal soap after using and rinsing off the CHG Soap.                9.  Pat yourself dry with a clean towel.            10.  Wear clean pajamas.            11.  Place clean sheets on your bed the night of your first shower and do not  sleep with pets. Day of Surgery : Do not apply any lotions/deodorants the morning of surgery.  Please wear clean clothes to the hospital/surgery center.  FAILURE TO FOLLOW THESE INSTRUCTIONS MAY RESULT IN THE CANCELLATION OF YOUR SURGERY  PATIENT SIGNATURE_________________________________  NURSE SIGNATURE__________________________________  ________________________________________________________________________   Jordan Willis  An incentive spirometer is a tool that can help keep your lungs clear and active. This tool  measures how well you are filling your lungs with each breath. Taking long deep breaths may help reverse or decrease the chance of developing breathing (pulmonary) problems (especially infection) following:  A long period of time when you are unable to move or be active. BEFORE THE PROCEDURE   If the spirometer includes an indicator to show your best effort, your nurse or respiratory therapist will set it to a desired goal.  If possible, sit up straight or lean slightly forward. Try not to slouch.  Hold the incentive spirometer in an upright position. INSTRUCTIONS FOR USE  1. Sit on the edge of your bed if possible, or sit up as far as you can in bed or on a chair. 2. Hold the incentive spirometer in an upright position. 3. Breathe out normally. 4. Place the mouthpiece in your mouth and seal your lips tightly around it. 5. Breathe in slowly and as deeply as possible, raising the piston or the ball toward the top of the column. 6. Hold your breath for 3-5 seconds or for as long as possible. Allow the piston or ball to fall to the bottom of the column. 7. Remove the mouthpiece from your mouth and breathe out normally. 8. Rest for a few seconds and repeat Steps 1 through 7 at least 10 times every 1-2 hours when you are awake. Take your time and take a few normal breaths between deep breaths. 9. The spirometer may include an indicator to show your best effort. Use the indicator as a goal to work toward during each repetition. 10. After each set of 10 deep breaths, practice coughing to be sure your lungs are clear. If you have an incision (the cut made at the time of surgery), support your incision when coughing by placing a pillow  or rolled up towels firmly against it. Once you are able to get out of bed, walk around indoors and cough well. You may stop using the incentive spirometer when instructed by your caregiver.  RISKS AND COMPLICATIONS  Take your time so you do not get dizzy or  light-headed.  If you are in pain, you may need to take or ask for pain medication before doing incentive spirometry. It is harder to take a deep breath if you are having pain. AFTER USE  Rest and breathe slowly and easily.  It can be helpful to keep track of a log of your progress. Your caregiver can provide you with a simple table to help with this. If you are using the spirometer at home, follow these instructions: [place] IF:   You are having difficultly using the spirometer.  You have trouble using the spirometer as often as instructed.  Your pain medication is not giving enough relief while using the spirometer.  You develop fever of 100.5 F (38.1 C) or higher. SEEK IMMEDIATE MEDICAL CARE IF:   You cough up bloody sputum that had not been present before.  You develop fever of 102 F (38.9 C) or greater.  You develop worsening pain at or near the incision site. MAKE SURE YOU:   Understand these instructions.  Will watch your condition.  Will get help right away if you are not doing well or get worse. Document Released: 02/18/2007 Document Revised: 12/31/2011 Document Reviewed: 04/21/2007 [facility] Patient Information 2014 [place], [place].   ________________________________________________________________________

## 2018-08-28 ENCOUNTER — Other Ambulatory Visit: Payer: Self-pay

## 2018-08-28 ENCOUNTER — Encounter (HOSPITAL_COMMUNITY): Payer: Self-pay

## 2018-08-28 ENCOUNTER — Encounter (HOSPITAL_COMMUNITY)
Admission: RE | Admit: 2018-08-28 | Discharge: 2018-08-28 | Disposition: A | Discharge status: Home / Self Care | Payer: Medicare Other | Source: Ambulatory Visit | Attending: Specialist | Admitting: Specialist

## 2018-08-28 DIAGNOSIS — M75101 Unspecified rotator cuff tear or rupture of right shoulder, not specified as traumatic: Secondary | ICD-10-CM | POA: Insufficient documentation

## 2018-08-28 DIAGNOSIS — R9431 Abnormal electrocardiogram [ECG] [EKG]: Secondary | ICD-10-CM | POA: Insufficient documentation

## 2018-08-28 DIAGNOSIS — M7501 Adhesive capsulitis of right shoulder: Secondary | ICD-10-CM | POA: Diagnosis not present

## 2018-08-28 DIAGNOSIS — Z01818 Encounter for other preprocedural examination: Secondary | ICD-10-CM | POA: Insufficient documentation

## 2018-08-28 HISTORY — DX: Personal history of other diseases of the circulatory system: Z86.79

## 2018-08-28 HISTORY — DX: Nontoxic single thyroid nodule: E04.1

## 2018-08-28 HISTORY — DX: Chronic sinusitis, unspecified: J32.9

## 2018-08-28 HISTORY — DX: Personal history of other benign neoplasm: Z86.018

## 2018-08-28 HISTORY — DX: Personal history of other diseases of the female genital tract: Z87.42

## 2018-08-28 HISTORY — DX: Personal history of other diseases of the nervous system and sense organs: Z86.69

## 2018-08-28 HISTORY — DX: Nausea with vomiting, unspecified: Z98.890

## 2018-08-28 HISTORY — DX: Anxiety disorder, unspecified: F41.9

## 2018-08-28 HISTORY — DX: Hypertrophy of nasal turbinates: J34.3

## 2018-08-28 HISTORY — DX: Sleep apnea, unspecified: G47.30

## 2018-08-28 HISTORY — DX: Other specified postprocedural states: R11.2

## 2018-08-28 HISTORY — DX: Nontoxic goiter, unspecified: E04.9

## 2018-08-28 HISTORY — DX: Carpal tunnel syndrome, unspecified upper limb: G56.00

## 2018-08-28 HISTORY — DX: Gastro-esophageal reflux disease without esophagitis: K21.9

## 2018-08-28 LAB — BASIC METABOLIC PANEL
Anion gap: 7 (ref 5–15)
BUN: 11 mg/dL (ref 8–23)
CALCIUM: 9.3 mg/dL (ref 8.9–10.3)
CHLORIDE: 104 mmol/L (ref 98–111)
CO2: 28 mmol/L (ref 22–32)
Creatinine, Ser: 0.83 mg/dL (ref 0.44–1.00)
GFR calc Af Amer: >60 mL/min (ref >60)
GFR calc non Af Amer: >60 mL/min (ref >60)
Glucose, Bld: 89 mg/dL (ref 70–99)
POTASSIUM: 3.8 mmol/L (ref 3.5–5.1)
Sodium: 139 mmol/L (ref 135–145)

## 2018-08-28 LAB — HEMOGLOBIN A1C
HEMOGLOBIN A1C: 6.1 % — AB (ref 4.8–5.6)
Mean Plasma Glucose: 128.37 mg/dL

## 2018-08-28 LAB — CBC
HCT: 40.8 % (ref 36.0–46.0)
HEMOGLOBIN: 12.8 g/dL (ref 12.0–15.0)
MCH: 29.5 pg (ref 26.0–34.0)
MCHC: 31.4 g/dL (ref 30.0–36.0)
MCV: 94 fL (ref 80.0–100.0)
Platelets: 257 10*3/uL (ref 150–400)
RBC: 4.34 MIL/uL (ref 3.87–5.11)
RDW: 12.2 % (ref 11.5–15.5)
WBC: 5.1 10*3/uL (ref 4.0–10.5)
nRBC: 0 % (ref 0.0–0.2)

## 2018-08-29 NOTE — Pre-Procedure Instructions (Signed)
EKG and Hgb A1C results 08/28/2018 sent to Dr. Tonita Cong via epic.

## 2018-09-02 NOTE — Anesthesia Preprocedure Evaluation (Addendum)
Anesthesia Evaluation  Patient identified by MRN, date of birth, ID band Patient awake    Reviewed: Allergy & Precautions, NPO status , Patient's Chart, lab work & pertinent test results  History of Anesthesia Complications (+) PONV  Airway Mallampati: III  TM Distance: >3 FB Neck ROM: Full  Mouth opening: Limited Mouth Opening  Dental  (+) Dental Advisory Given   Pulmonary sleep apnea ,    breath sounds clear to auscultation       Cardiovascular  Rhythm:Regular Rate:Normal     Neuro/Psych  Headaches,    GI/Hepatic   Endo/Other  diabetes  Renal/GU      Musculoskeletal   Abdominal   Peds  Hematology   Anesthesia Other Findings   Reproductive/Obstetrics                            Lab Results  Component Value Date   WBC 5.1 08/28/2018   HGB 12.8 08/28/2018   HCT 40.8 08/28/2018   MCV 94.0 08/28/2018   PLT 257 08/28/2018   Lab Results  Component Value Date   CREATININE 0.83 08/28/2018   BUN 11 08/28/2018   NA 139 08/28/2018   K 3.8 08/28/2018   CL 104 08/28/2018   CO2 28 08/28/2018    Anesthesia Physical Anesthesia Plan  ASA: II  Anesthesia Plan: General   Post-op Pain Management:  Regional for Post-op pain   Induction: Intravenous  PONV Risk Score and Plan: 4 or greater and Scopolamine patch - Pre-op, Midazolam, Dexamethasone, Ondansetron and Treatment may vary due to age or medical condition  Airway Management Planned: Oral ETT  Additional Equipment:   Intra-op Plan:   Post-operative Plan: Extubation in OR  Informed Consent: I have reviewed the patients History and Physical, chart, labs and discussed the procedure including the risks, benefits and alternatives for the proposed anesthesia with the patient or authorized representative who has indicated his/her understanding and acceptance.   Dental advisory given  Plan Discussed with: CRNA  Anesthesia Plan  Comments:        Anesthesia Quick Evaluation

## 2018-09-03 ENCOUNTER — Encounter (HOSPITAL_COMMUNITY): Payer: Self-pay | Admitting: Certified Registered"

## 2018-09-03 ENCOUNTER — Ambulatory Visit (HOSPITAL_COMMUNITY): Payer: Medicare Other | Admitting: Anesthesiology

## 2018-09-03 ENCOUNTER — Encounter (HOSPITAL_COMMUNITY)
Admission: RE | Disposition: A | Discharge status: Home / Self Care | Payer: Self-pay | Source: Other Acute Inpatient Hospital | Attending: Specialist

## 2018-09-03 ENCOUNTER — Other Ambulatory Visit: Payer: Self-pay

## 2018-09-03 ENCOUNTER — Ambulatory Visit (HOSPITAL_COMMUNITY)
Admission: RE | Admit: 2018-09-03 | Discharge: 2018-09-04 | Disposition: A | Discharge status: Home / Self Care | Payer: Medicare Other | Source: Other Acute Inpatient Hospital | Attending: Specialist | Admitting: Specialist

## 2018-09-03 DIAGNOSIS — Z886 Allergy status to analgesic agent status: Secondary | ICD-10-CM | POA: Insufficient documentation

## 2018-09-03 DIAGNOSIS — R51 Headache: Secondary | ICD-10-CM | POA: Insufficient documentation

## 2018-09-03 DIAGNOSIS — Z87442 Personal history of urinary calculi: Secondary | ICD-10-CM | POA: Diagnosis not present

## 2018-09-03 DIAGNOSIS — M7541 Impingement syndrome of right shoulder: Secondary | ICD-10-CM | POA: Insufficient documentation

## 2018-09-03 DIAGNOSIS — Z8249 Family history of ischemic heart disease and other diseases of the circulatory system: Secondary | ICD-10-CM | POA: Insufficient documentation

## 2018-09-03 DIAGNOSIS — Z791 Long term (current) use of non-steroidal anti-inflammatories (NSAID): Secondary | ICD-10-CM | POA: Diagnosis not present

## 2018-09-03 DIAGNOSIS — M7501 Adhesive capsulitis of right shoulder: Secondary | ICD-10-CM | POA: Insufficient documentation

## 2018-09-03 DIAGNOSIS — G5600 Carpal tunnel syndrome, unspecified upper limb: Secondary | ICD-10-CM | POA: Diagnosis not present

## 2018-09-03 DIAGNOSIS — Z923 Personal history of irradiation: Secondary | ICD-10-CM | POA: Diagnosis not present

## 2018-09-03 DIAGNOSIS — Z853 Personal history of malignant neoplasm of breast: Secondary | ICD-10-CM | POA: Diagnosis not present

## 2018-09-03 DIAGNOSIS — G4730 Sleep apnea, unspecified: Secondary | ICD-10-CM | POA: Diagnosis not present

## 2018-09-03 DIAGNOSIS — E049 Nontoxic goiter, unspecified: Secondary | ICD-10-CM | POA: Insufficient documentation

## 2018-09-03 DIAGNOSIS — Z79899 Other long term (current) drug therapy: Secondary | ICD-10-CM | POA: Diagnosis not present

## 2018-09-03 DIAGNOSIS — M4696 Unspecified inflammatory spondylopathy, lumbar region: Secondary | ICD-10-CM | POA: Insufficient documentation

## 2018-09-03 DIAGNOSIS — M75120 Complete rotator cuff tear or rupture of unspecified shoulder, not specified as traumatic: Secondary | ICD-10-CM | POA: Diagnosis present

## 2018-09-03 DIAGNOSIS — Z833 Family history of diabetes mellitus: Secondary | ICD-10-CM | POA: Insufficient documentation

## 2018-09-03 DIAGNOSIS — F419 Anxiety disorder, unspecified: Secondary | ICD-10-CM | POA: Diagnosis not present

## 2018-09-03 DIAGNOSIS — R0600 Dyspnea, unspecified: Secondary | ICD-10-CM | POA: Insufficient documentation

## 2018-09-03 DIAGNOSIS — M75101 Unspecified rotator cuff tear or rupture of right shoulder, not specified as traumatic: Secondary | ICD-10-CM | POA: Insufficient documentation

## 2018-09-03 DIAGNOSIS — Z888 Allergy status to other drugs, medicaments and biological substances status: Secondary | ICD-10-CM | POA: Diagnosis not present

## 2018-09-03 DIAGNOSIS — Z90710 Acquired absence of both cervix and uterus: Secondary | ICD-10-CM | POA: Diagnosis not present

## 2018-09-03 DIAGNOSIS — K219 Gastro-esophageal reflux disease without esophagitis: Secondary | ICD-10-CM | POA: Diagnosis not present

## 2018-09-03 DIAGNOSIS — Z803 Family history of malignant neoplasm of breast: Secondary | ICD-10-CM | POA: Diagnosis not present

## 2018-09-03 DIAGNOSIS — E119 Type 2 diabetes mellitus without complications: Secondary | ICD-10-CM | POA: Diagnosis not present

## 2018-09-03 DIAGNOSIS — Z9221 Personal history of antineoplastic chemotherapy: Secondary | ICD-10-CM | POA: Insufficient documentation

## 2018-09-03 DIAGNOSIS — G8918 Other acute postprocedural pain: Secondary | ICD-10-CM | POA: Diagnosis not present

## 2018-09-03 DIAGNOSIS — M75111 Incomplete rotator cuff tear or rupture of right shoulder, not specified as traumatic: Secondary | ICD-10-CM | POA: Diagnosis not present

## 2018-09-03 DIAGNOSIS — E785 Hyperlipidemia, unspecified: Secondary | ICD-10-CM | POA: Diagnosis not present

## 2018-09-03 HISTORY — PX: SHOULDER ARTHROSCOPY WITH ROTATOR CUFF REPAIR AND SUBACROMIAL DECOMPRESSION: SHX5686

## 2018-09-03 LAB — GLUCOSE, CAPILLARY
GLUCOSE-CAPILLARY: 114 mg/dL — AB (ref 70–99)
Glucose-Capillary: 106 mg/dL — ABNORMAL HIGH (ref 70–99)

## 2018-09-03 SURGERY — SHOULDER ARTHROSCOPY WITH ROTATOR CUFF REPAIR AND SUBACROMIAL DECOMPRESSION
Anesthesia: General | Site: Shoulder | Laterality: Right

## 2018-09-03 MED ORDER — MIDAZOLAM HCL 2 MG/2ML IJ SOLN
INTRAMUSCULAR | Status: AC
  Filled 2018-09-03: qty 2

## 2018-09-03 MED ORDER — ROCURONIUM BROMIDE 10 MG/ML (PF) SYRINGE
PREFILLED_SYRINGE | INTRAVENOUS | Status: DC | PRN
  Administered 2018-09-03: 40 mg via INTRAVENOUS

## 2018-09-03 MED ORDER — METHOCARBAMOL 500 MG PO TABS
500.0000 mg | ORAL_TABLET | Freq: Four times a day (QID) | ORAL | Status: DC | PRN
  Administered 2018-09-04 (×2): 500 mg via ORAL
  Filled 2018-09-03 (×2): qty 1

## 2018-09-03 MED ORDER — ROCURONIUM BROMIDE 10 MG/ML (PF) SYRINGE
PREFILLED_SYRINGE | INTRAVENOUS | Status: AC
  Filled 2018-09-03: qty 10

## 2018-09-03 MED ORDER — DOCUSATE SODIUM 100 MG PO CAPS: 100.0000 mg | ORAL_CAPSULE | Freq: Two times a day (BID) | ORAL | 2 refills | Status: AC

## 2018-09-03 MED ORDER — SODIUM CHLORIDE 0.9 % IV SOLN
INTRAVENOUS | Status: AC
  Filled 2018-09-03: qty 500000

## 2018-09-03 MED ORDER — HYDROCODONE-ACETAMINOPHEN 5-325 MG PO TABS: 1.0000 | ORAL_TABLET | Freq: Four times a day (QID) | ORAL | 0 refills | Status: DC | PRN

## 2018-09-03 MED ORDER — ONDANSETRON HCL 4 MG/2ML IJ SOLN
INTRAMUSCULAR | Status: DC | PRN
  Administered 2018-09-03: 4 mg via INTRAVENOUS

## 2018-09-03 MED ORDER — SCOPOLAMINE 1 MG/3DAYS TD PT72
MEDICATED_PATCH | TRANSDERMAL | Status: AC
  Filled 2018-09-03: qty 1

## 2018-09-03 MED ORDER — DEXAMETHASONE SODIUM PHOSPHATE 10 MG/ML IJ SOLN
INTRAMUSCULAR | Status: AC
  Filled 2018-09-03: qty 1

## 2018-09-03 MED ORDER — PROPOFOL 10 MG/ML IV BOLUS
INTRAVENOUS | Status: DC | PRN
  Administered 2018-09-03: 140 mg via INTRAVENOUS

## 2018-09-03 MED ORDER — MORPHINE SULFATE (PF) 2 MG/ML IV SOLN: 0.5000 mg | INTRAVENOUS | Status: DC | PRN

## 2018-09-03 MED ORDER — LIDOCAINE 2% (20 MG/ML) 5 ML SYRINGE
INTRAMUSCULAR | Status: AC
  Filled 2018-09-03: qty 5

## 2018-09-03 MED ORDER — PHENYLEPHRINE 40 MCG/ML (10ML) SYRINGE FOR IV PUSH (FOR BLOOD PRESSURE SUPPORT)
PREFILLED_SYRINGE | INTRAVENOUS | Status: AC
  Filled 2018-09-03: qty 10

## 2018-09-03 MED ORDER — CHROMIUM PICOLINATE 1000 MCG PO TABS: 1000.0000 ug | ORAL_TABLET | Freq: Every day | ORAL | Status: DC

## 2018-09-03 MED ORDER — DOCUSATE SODIUM 100 MG PO CAPS
100.0000 mg | ORAL_CAPSULE | Freq: Two times a day (BID) | ORAL | Status: DC
  Administered 2018-09-03 – 2018-09-04 (×2): 100 mg via ORAL
  Filled 2018-09-03 (×2): qty 1

## 2018-09-03 MED ORDER — METOCLOPRAMIDE HCL 5 MG PO TABS: 5.0000 mg | ORAL_TABLET | Freq: Three times a day (TID) | ORAL | Status: DC | PRN

## 2018-09-03 MED ORDER — PHENOL 1.4 % MT LIQD: 1.0000 | OROMUCOSAL | Status: DC | PRN

## 2018-09-03 MED ORDER — BISACODYL 5 MG PO TBEC: 5.0000 mg | DELAYED_RELEASE_TABLET | Freq: Every day | ORAL | Status: DC | PRN

## 2018-09-03 MED ORDER — FENTANYL CITRATE (PF) 100 MCG/2ML IJ SOLN
INTRAMUSCULAR | Status: DC | PRN
  Administered 2018-09-03: 100 ug via INTRAVENOUS

## 2018-09-03 MED ORDER — PROMETHAZINE HCL 25 MG/ML IJ SOLN: 6.2500 mg | INTRAMUSCULAR | Status: DC | PRN

## 2018-09-03 MED ORDER — HYDROCODONE-ACETAMINOPHEN 7.5-325 MG PO TABS: 1.0000 | ORAL_TABLET | ORAL | Status: DC | PRN

## 2018-09-03 MED ORDER — FENTANYL CITRATE (PF) 100 MCG/2ML IJ SOLN
INTRAMUSCULAR | Status: AC
  Filled 2018-09-03: qty 2

## 2018-09-03 MED ORDER — SUGAMMADEX SODIUM 200 MG/2ML IV SOLN
INTRAVENOUS | Status: AC
  Filled 2018-09-03: qty 2

## 2018-09-03 MED ORDER — BUPIVACAINE-EPINEPHRINE (PF) 0.25% -1:200000 IJ SOLN
INTRAMUSCULAR | Status: AC
  Filled 2018-09-03: qty 30

## 2018-09-03 MED ORDER — ALUM & MAG HYDROXIDE-SIMETH 200-200-20 MG/5ML PO SUSP: 30.0000 mL | ORAL | Status: DC | PRN

## 2018-09-03 MED ORDER — SERTRALINE HCL 25 MG PO TABS
25.0000 mg | ORAL_TABLET | Freq: Every day | ORAL | Status: DC
  Administered 2018-09-04: 25 mg via ORAL
  Filled 2018-09-03: qty 1

## 2018-09-03 MED ORDER — ENOXAPARIN SODIUM 30 MG/0.3ML ~~LOC~~ SOLN
30.0000 mg | SUBCUTANEOUS | Status: DC
  Administered 2018-09-04: 30 mg via SUBCUTANEOUS
  Filled 2018-09-03: qty 0.3

## 2018-09-03 MED ORDER — KCL IN DEXTROSE-NACL 20-5-0.45 MEQ/L-%-% IV SOLN
INTRAVENOUS | Status: AC
  Administered 2018-09-03: 13:00:00 via INTRAVENOUS
  Filled 2018-09-03: qty 1000

## 2018-09-03 MED ORDER — LACTATED RINGERS IV SOLN
INTRAVENOUS | Status: DC
  Administered 2018-09-03 (×2): via INTRAVENOUS

## 2018-09-03 MED ORDER — DIPHENHYDRAMINE HCL 12.5 MG/5ML PO ELIX: 12.5000 mg | ORAL_SOLUTION | ORAL | Status: DC | PRN

## 2018-09-03 MED ORDER — MIDAZOLAM HCL 2 MG/2ML IJ SOLN: 1.0000 mg | Freq: Once | INTRAMUSCULAR | Status: DC

## 2018-09-03 MED ORDER — MENTHOL 3 MG MT LOZG: 1.0000 | LOZENGE | OROMUCOSAL | Status: DC | PRN

## 2018-09-03 MED ORDER — METHOCARBAMOL 500 MG IVPB - SIMPLE MED
500.0000 mg | Freq: Four times a day (QID) | INTRAVENOUS | Status: DC | PRN
  Filled 2018-09-03: qty 50

## 2018-09-03 MED ORDER — DEXAMETHASONE SODIUM PHOSPHATE 10 MG/ML IJ SOLN
INTRAMUSCULAR | Status: DC | PRN
  Administered 2018-09-03: 10 mg via INTRAVENOUS

## 2018-09-03 MED ORDER — MAGNESIUM CITRATE PO SOLN: 1.0000 | Freq: Once | ORAL | Status: DC | PRN

## 2018-09-03 MED ORDER — SODIUM CHLORIDE 0.9 % IV SOLN
INTRAVENOUS | Status: DC | PRN
  Administered 2018-09-03: 500 mL

## 2018-09-03 MED ORDER — ONDANSETRON HCL 4 MG PO TABS
4.0000 mg | ORAL_TABLET | Freq: Four times a day (QID) | ORAL | Status: DC | PRN
  Filled 2018-09-03: qty 1

## 2018-09-03 MED ORDER — ROPIVACAINE HCL 5 MG/ML IJ SOLN
INTRAMUSCULAR | Status: DC | PRN
  Administered 2018-09-03: 25 mL via PERINEURAL

## 2018-09-03 MED ORDER — LIDOCAINE-EPINEPHRINE (PF) 1 %-1:200000 IJ SOLN
INTRAMUSCULAR | Status: AC
  Filled 2018-09-03: qty 30

## 2018-09-03 MED ORDER — BUPIVACAINE-EPINEPHRINE (PF) 0.25% -1:200000 IJ SOLN
INTRAMUSCULAR | Status: DC | PRN
  Administered 2018-09-03: 5 mL

## 2018-09-03 MED ORDER — METOCLOPRAMIDE HCL 5 MG/ML IJ SOLN: 5.0000 mg | Freq: Three times a day (TID) | INTRAMUSCULAR | Status: DC | PRN

## 2018-09-03 MED ORDER — BUPIVACAINE HCL (PF) 0.5 % IJ SOLN
INTRAMUSCULAR | Status: AC
  Filled 2018-09-03: qty 30

## 2018-09-03 MED ORDER — VITAMIN E 45 MG (100 UNIT) PO CAPS
200.0000 [IU] | ORAL_CAPSULE | Freq: Every day | ORAL | Status: DC
  Administered 2018-09-04: 200 [IU] via ORAL
  Filled 2018-09-03: qty 2

## 2018-09-03 MED ORDER — DARIFENACIN HYDROBROMIDE ER 15 MG PO TB24
15.0000 mg | ORAL_TABLET | Freq: Every day | ORAL | Status: DC
  Administered 2018-09-04: 15 mg via ORAL
  Filled 2018-09-03: qty 1

## 2018-09-03 MED ORDER — POLYETHYL GLYCOL-PROPYL GLYCOL 0.4-0.3 % OP SOLN: Freq: Every day | OPHTHALMIC | Status: DC | PRN

## 2018-09-03 MED ORDER — ACETAMINOPHEN 325 MG PO TABS
325.0000 mg | ORAL_TABLET | Freq: Four times a day (QID) | ORAL | Status: DC | PRN
  Administered 2018-09-04: 650 mg via ORAL
  Filled 2018-09-03: qty 2

## 2018-09-03 MED ORDER — ALBUTEROL SULFATE (2.5 MG/3ML) 0.083% IN NEBU: 2.5000 mg | INHALATION_SOLUTION | Freq: Four times a day (QID) | RESPIRATORY_TRACT | Status: DC | PRN

## 2018-09-03 MED ORDER — FENTANYL CITRATE (PF) 100 MCG/2ML IJ SOLN: 25.0000 ug | INTRAMUSCULAR | Status: DC | PRN

## 2018-09-03 MED ORDER — LORATADINE 10 MG PO TABS
10.0000 mg | ORAL_TABLET | Freq: Every day | ORAL | Status: DC
  Administered 2018-09-04: 10 mg via ORAL
  Filled 2018-09-03: qty 1

## 2018-09-03 MED ORDER — ONDANSETRON HCL 4 MG/2ML IJ SOLN: 4.0000 mg | Freq: Four times a day (QID) | INTRAMUSCULAR | Status: DC | PRN

## 2018-09-03 MED ORDER — TRAZODONE HCL 50 MG PO TABS: 25.0000 mg | ORAL_TABLET | Freq: Every evening | ORAL | Status: DC | PRN

## 2018-09-03 MED ORDER — FENTANYL CITRATE (PF) 100 MCG/2ML IJ SOLN
50.0000 ug | Freq: Once | INTRAMUSCULAR | Status: AC
  Administered 2018-09-03 (×2): 50 ug via INTRAVENOUS

## 2018-09-03 MED ORDER — PROPOFOL 10 MG/ML IV BOLUS
INTRAVENOUS | Status: AC
  Filled 2018-09-03: qty 20

## 2018-09-03 MED ORDER — ACETAMINOPHEN 500 MG PO TABS: 500.0000 mg | ORAL_TABLET | Freq: Four times a day (QID) | ORAL | Status: DC | PRN

## 2018-09-03 MED ORDER — CEFAZOLIN SODIUM-DEXTROSE 2-4 GM/100ML-% IV SOLN
2.0000 g | INTRAVENOUS | Status: AC
  Administered 2018-09-03: 2 g via INTRAVENOUS
  Filled 2018-09-03: qty 100

## 2018-09-03 MED ORDER — CHLORHEXIDINE GLUCONATE 4 % EX LIQD: 60.0000 mL | Freq: Once | CUTANEOUS | Status: DC

## 2018-09-03 MED ORDER — MIDAZOLAM HCL 5 MG/5ML IJ SOLN
INTRAMUSCULAR | Status: DC | PRN
  Administered 2018-09-03 (×2): 1 mg via INTRAVENOUS

## 2018-09-03 MED ORDER — CLONIDINE HCL (ANALGESIA) 100 MCG/ML EP SOLN
EPIDURAL | Status: DC | PRN
  Administered 2018-09-03: 50 ug

## 2018-09-03 MED ORDER — POLYETHYLENE GLYCOL 3350 17 G PO PACK: 17.0000 g | PACK | Freq: Every day | ORAL | Status: DC | PRN

## 2018-09-03 MED ORDER — CEFAZOLIN SODIUM-DEXTROSE 2-4 GM/100ML-% IV SOLN
2.0000 g | Freq: Four times a day (QID) | INTRAVENOUS | Status: AC
  Administered 2018-09-03 (×2): 2 g via INTRAVENOUS
  Filled 2018-09-03 (×2): qty 100

## 2018-09-03 MED ORDER — SCOPOLAMINE 1 MG/3DAYS TD PT72
MEDICATED_PATCH | TRANSDERMAL | Status: DC | PRN
  Administered 2018-09-03: 1 via TRANSDERMAL

## 2018-09-03 MED ORDER — LIDOCAINE 2% (20 MG/ML) 5 ML SYRINGE
INTRAMUSCULAR | Status: DC | PRN
  Administered 2018-09-03: 20 mg via INTRAVENOUS

## 2018-09-03 MED ORDER — HYDROCODONE-ACETAMINOPHEN 5-325 MG PO TABS
1.0000 | ORAL_TABLET | ORAL | Status: DC | PRN
  Administered 2018-09-04 (×2): 1 via ORAL
  Administered 2018-09-04: 2 via ORAL
  Filled 2018-09-03: qty 1
  Filled 2018-09-03: qty 2
  Filled 2018-09-03: qty 1

## 2018-09-03 MED ORDER — SUGAMMADEX SODIUM 200 MG/2ML IV SOLN
INTRAVENOUS | Status: DC | PRN
  Administered 2018-09-03: 175 mg via INTRAVENOUS

## 2018-09-03 MED ORDER — ONDANSETRON HCL 4 MG/2ML IJ SOLN
INTRAMUSCULAR | Status: AC
  Filled 2018-09-03: qty 2

## 2018-09-03 MED ORDER — PHENYLEPHRINE 40 MCG/ML (10ML) SYRINGE FOR IV PUSH (FOR BLOOD PRESSURE SUPPORT)
PREFILLED_SYRINGE | INTRAVENOUS | Status: DC | PRN
  Administered 2018-09-03: 40 ug via INTRAVENOUS
  Administered 2018-09-03 (×2): 80 ug via INTRAVENOUS

## 2018-09-03 SURGICAL SUPPLY — 44 items
ANCHOR NEEDLE 9/16 CIR SZ 8 (NEEDLE) ×2 IMPLANT
BAG ZIPLOCK 12X15 (MISCELLANEOUS) IMPLANT
BLADE OSCILLATING/SAGITTAL (BLADE)
BLADE SW THK.38XMED LNG THN (BLADE) IMPLANT
CLOTH 2% CHLOROHEXIDINE 3PK (PERSONAL CARE ITEMS) ×2 IMPLANT
COVER SURGICAL LIGHT HANDLE (MISCELLANEOUS) ×2 IMPLANT
COVER WAND RF STERILE (DRAPES) ×2 IMPLANT
DRAPE POUCH INSTRU U-SHP 10X18 (DRAPES) ×2 IMPLANT
DRSG AQUACEL AG ADV 3.5X 4 (GAUZE/BANDAGES/DRESSINGS) ×2 IMPLANT
DRSG AQUACEL AG ADV 3.5X 6 (GAUZE/BANDAGES/DRESSINGS) IMPLANT
DURAPREP 26ML APPLICATOR (WOUND CARE) ×2 IMPLANT
ELECT NEEDLE TIP 2.8 STRL (NEEDLE) ×2 IMPLANT
ELECT REM PT RETURN 15FT ADLT (MISCELLANEOUS) ×2 IMPLANT
GLOVE BIOGEL PI IND STRL 7.5 (GLOVE) ×1 IMPLANT
GLOVE BIOGEL PI IND STRL 8 (GLOVE) IMPLANT
GLOVE BIOGEL PI INDICATOR 7.5 (GLOVE) ×1
GLOVE BIOGEL PI INDICATOR 8 (GLOVE)
GLOVE SURG SS PI 7.5 STRL IVOR (GLOVE) ×2 IMPLANT
GLOVE SURG SS PI 8.0 STRL IVOR (GLOVE) ×2 IMPLANT
GOWN STRL REUS W/TWL XL LVL3 (GOWN DISPOSABLE) ×4 IMPLANT
KIT BASIN OR (CUSTOM PROCEDURE TRAY) ×2 IMPLANT
KIT POSITION SHOULDER SCHLEI (MISCELLANEOUS) ×2 IMPLANT
MANIFOLD NEPTUNE II (INSTRUMENTS) ×2 IMPLANT
MARKER SKIN DUAL TIP RULER LAB (MISCELLANEOUS) ×2 IMPLANT
NEEDLE SCORPION MULTI FIRE (NEEDLE) IMPLANT
PACK SHOULDER (CUSTOM PROCEDURE TRAY) ×2 IMPLANT
POSITIONER SURGICAL ARM (MISCELLANEOUS) ×2 IMPLANT
SLING ARM IMMOBILIZER LRG (SOFTGOODS) IMPLANT
SLING ARM IMMOBILIZER MED (SOFTGOODS) ×2 IMPLANT
SLING ULTRA II L (ORTHOPEDIC SUPPLIES) IMPLANT
STRIP CLOSURE SKIN 1/2X4 (GAUZE/BANDAGES/DRESSINGS) ×2 IMPLANT
SUT BONE WAX W31G (SUTURE) IMPLANT
SUT ETHIBOND NAB CT1 #1 30IN (SUTURE) ×2 IMPLANT
SUT FIBERWIRE #2 38 T-5 BLUE (SUTURE)
SUT PROLENE 3 0 PS 2 (SUTURE) ×2 IMPLANT
SUT TIGER TAPE 7 IN WHITE (SUTURE) IMPLANT
SUT VIC AB 1-0 CT2 27 (SUTURE) ×2 IMPLANT
SUT VIC AB 2-0 CT2 27 (SUTURE) ×2 IMPLANT
SUT VICRYL 0 TIES 12 18 (SUTURE) ×2 IMPLANT
SUTURE FIBERWR #2 38 T-5 BLUE (SUTURE) IMPLANT
TAPE FIBER 2MM 7IN #2 BLUE (SUTURE) IMPLANT
TOWEL OR 17X26 10 PK STRL BLUE (TOWEL DISPOSABLE) ×2 IMPLANT
TOWEL OR NON WOVEN STRL DISP B (DISPOSABLE) ×2 IMPLANT
YANKAUER SUCT BULB TIP NO VENT (SUCTIONS) ×2 IMPLANT

## 2018-09-03 NOTE — Anesthesia Procedure Notes (Signed)
Procedure Name: Intubation Date/Time: 09/03/2018 8:54 AM Performed by: Damichael Hofman D, CRNA Pre-anesthesia Checklist: Patient identified, Emergency Drugs available, Suction available and Patient being monitored Patient Re-evaluated:Patient Re-evaluated prior to induction Oxygen Delivery Method: Circle system utilized Preoxygenation: Pre-oxygenation with 100% oxygen Induction Type: IV induction Ventilation: Mask ventilation without difficulty Laryngoscope Size: Mac and 3 Grade View: Grade II Tube type: Oral Tube size: 7.5 mm Number of attempts: 1 Airway Equipment and Method: Stylet and Oral airway Placement Confirmation: ETT inserted through vocal cords under direct vision,  positive ETCO2 and breath sounds checked- equal and bilateral Secured at: 21 cm Tube secured with: Tape Dental Injury: Teeth and Oropharynx as per pre-operative assessment

## 2018-09-03 NOTE — Interval H&P Note (Signed)
History and Physical Interval Note:  09/03/2018 8:35 AM  Jordan Willis  has presented today for surgery, with the diagnosis of Right shoulder rotator tear, adhesive capsulitis  The various methods of treatment have been discussed with the patient and family. After consideration of risks, benefits and other options for treatment, the patient has consented to  Procedure(s) with comments: Right shoulder manipulation under anesthesia, exam under anesthesia, mini open rotator cuff repair, subacromial decompression (Right) - 90 mins as a surgical intervention .  The patient's history has been reviewed, patient examined, no change in status, stable for surgery.  I have reviewed the patient's chart and labs.  Questions were answered to the patient's satisfaction.     Josedejesus Marcum C

## 2018-09-03 NOTE — Op Note (Signed)
NAME: IRA, BUSBIN MEDICAL RECORD FU:[id number] ACCOUNT [id number] DATE OF BIRTH:15-Sep-1951 FACILITY: [facility] LOCATION: [facility] PHYSICIAN:Derisha Funderburke Windy Kalata, MD  OPERATIVE REPORT  DATE OF PROCEDURE:  09/03/2018  PREOPERATIVE DIAGNOSES:  Rotator cuff tear, adhesive capsulitis, impingement syndrome, right shoulder.  POSTOPERATIVE DIAGNOSES:  Rotator cuff tear, adhesive capsulitis, impingement syndrome, right shoulder.  PROCEDURES PERFORMED: 1.  Exam followed by manipulation under anesthesia. 2.  Mini open rotator cuff repair with subacromial decompression, acromioplasty.  ANESTHESIA:  General.  ASSISTANT:  Lacie Draft, PA  HISTORY:  A 67 year old with refractory shoulder pain.  MRI indicating tear  adhesive capsulitis.  Refractory conservative treatment, indicated for manipulation restoration of range of motion, subacromial decompression, acromioplasty and repair of the  rotator cuff.  Risks and benefits discussed including bleeding, infection, damage to neurovascular structures, no change in symptoms, worsening symptoms, DVT, PE, anesthetic complications, etc.  TECHNIQUE:  With the patient in supine beach chair position.  After induction of adequate general anesthesia and a regional block, the right shoulder and upper extremity was prepped and draped in the usual sterile fashion.  The patient had limitation of  her range of motion, abduction and forward flexion.  This was gently improved with manipulative motion.  She had good internal and external rotation.  A surgical marker utilized to delineate the acromion, AC joint and coracoid.  A 2 cm incision over the  anterolateral aspect of the acromion was made in the skin after infiltration with 0.25% Marcaine with epinephrine.  Subcutaneous tissue was dissected with cautery to achieve hemostasis.  The rhaphe between the anterolateral heads was divided in line with  skin incision.  A self-retaining retractor was placed.  I released the CA  ligament, excised it.  I digitally lysed subacromial adhesions and subdeltoid adhesions.  Hypertrophic bursa was excised.  A tear of the supraspinatus near its insertion was  noted.  I sized it and debrided it and repaired it side to side with 0 Vicryl interrupted figure-of-eight sutures about a centimeter in length.  It was not detached.  No anchors were required.  The remainder of the joint was unremarkable.  I copiously irrigated the wound and repaired the raphae with #1 Vicryl interrupted figure-of-eight sutures, subcu with 2-0 and skin with Prolene.  The  acromioplasty was performed with a 3 mm Kerrison, removing a small portion of the anterolateral aspect of the acromion.  The patient was then irrigated.  A sterile dressing was applied, placed in a sling, extubated without difficulty and transported to the recovery room in satisfactory condition.  The patient tolerated the procedure well.  No complications.  Assistant Lacie Draft, [place].  Minimal blood loss.  Postoperatively, the patient will have extended observation due to her cardiac history.  AN/NUANCE  D:09/03/2018 T:09/03/2018 JOB:[id number]

## 2018-09-03 NOTE — Brief Op Note (Signed)
09/03/2018  9:35 AM  PATIENT:  Jordan Willis  67 y.o. female  PRE-OPERATIVE DIAGNOSIS:  Right shoulder rotator tear, adhesive capsulitis  POST-OPERATIVE DIAGNOSIS:  Right shoulder rotator tear, adhesive capsulitis  PROCEDURE:  Procedure(s) with comments: Right shoulder manipulation under anesthesia, exam under anesthesia, mini open rotator cuff repair, subacromial decompression (Right) - 90 mins  SURGEON:  Surgeon(s) and Role:    Susa Day, MD - Primary  PHYSICIAN ASSISTANT:   ASSISTANTS: none   ANESTHESIA:   general  EBL:  min   BLOOD ADMINISTERED:none  DRAINS: none   LOCAL MEDICATIONS USED:  MARCAINE     SPECIMEN:  No Specimen  DISPOSITION OF SPECIMEN:  N/A  COUNTS:  YES  TOURNIQUET:  * No tourniquets in log *  DICTATION: .Other Dictation: Dictation Number [id number]  PLAN OF CARE: Admit for overnight observation  PATIENT DISPOSITION:  PACU - hemodynamically stable.   Delay start of Pharmacological VTE agent (>24hrs) due to surgical blood loss or risk of bleeding: no

## 2018-09-03 NOTE — Transfer of Care (Signed)
Immediate Anesthesia Transfer of Care Note  Patient: Jordan Willis  Procedure(s) Performed: Right shoulder manipulation under anesthesia, exam under anesthesia, mini open rotator cuff repair, subacromial decompression (Right Shoulder)  Patient Location: PACU  Anesthesia Type:General and Regional  Level of Consciousness: awake, alert  and oriented  Airway & Oxygen Therapy: Patient Spontanous Breathing and Patient connected to face mask oxygen  Post-op Assessment: Report given to RN and Post -op Vital signs reviewed and stable  Post vital signs: Reviewed and stable  Last Vitals:  Vitals Value Taken Time  BP    Temp    Pulse 60 09/03/2018  9:46 AM  Resp 25 09/03/2018  9:58 AM  SpO2 99 % 09/03/2018  9:46 AM  Vitals shown include unvalidated device data.  Last Pain:  Vitals:   09/03/18 0717  TempSrc:   PainSc: 7          Complications: No apparent anesthesia complications

## 2018-09-03 NOTE — Anesthesia Procedure Notes (Signed)
Anesthesia Regional Block: Interscalene brachial plexus block   Pre-Anesthetic Checklist: ,, timeout performed, Correct Patient, Correct Site, Correct Laterality, Correct Procedure, Correct Position, site marked, Risks and benefits discussed,  Surgical consent,  Pre-op evaluation,  At surgeon's request and post-op pain management  Laterality: Right  Prep: chloraprep       Needles:  Injection technique: Single-shot  Needle Type: Echogenic Stimulator Needle     Needle Length: 9cm  Needle Gauge: 21     Additional Needles:   Procedures:, nerve stimulator,,, ultrasound used (permanent image in chart),,,,   Nerve Stimulator or Paresthesia:  Response: deltoid and biceps, 0.5 mA,   Additional Responses:   Narrative:  Start time: 09/03/2018 8:12 AM End time: 09/03/2018 8:18 AM Injection made incrementally with aspirations every 5 mL.  Performed by: Personally  Anesthesiologist: Suzette Battiest, MD

## 2018-09-03 NOTE — Discharge Instructions (Signed)

## 2018-09-04 DIAGNOSIS — Z8249 Family history of ischemic heart disease and other diseases of the circulatory system: Secondary | ICD-10-CM | POA: Diagnosis not present

## 2018-09-04 DIAGNOSIS — Z9221 Personal history of antineoplastic chemotherapy: Secondary | ICD-10-CM | POA: Diagnosis not present

## 2018-09-04 DIAGNOSIS — G4730 Sleep apnea, unspecified: Secondary | ICD-10-CM | POA: Diagnosis not present

## 2018-09-04 DIAGNOSIS — Z803 Family history of malignant neoplasm of breast: Secondary | ICD-10-CM | POA: Diagnosis not present

## 2018-09-04 DIAGNOSIS — Z791 Long term (current) use of non-steroidal anti-inflammatories (NSAID): Secondary | ICD-10-CM | POA: Diagnosis not present

## 2018-09-04 DIAGNOSIS — Z923 Personal history of irradiation: Secondary | ICD-10-CM | POA: Diagnosis not present

## 2018-09-04 DIAGNOSIS — M7501 Adhesive capsulitis of right shoulder: Secondary | ICD-10-CM | POA: Diagnosis not present

## 2018-09-04 DIAGNOSIS — G5600 Carpal tunnel syndrome, unspecified upper limb: Secondary | ICD-10-CM | POA: Diagnosis not present

## 2018-09-04 DIAGNOSIS — Z79899 Other long term (current) drug therapy: Secondary | ICD-10-CM | POA: Diagnosis not present

## 2018-09-04 DIAGNOSIS — M4696 Unspecified inflammatory spondylopathy, lumbar region: Secondary | ICD-10-CM | POA: Diagnosis not present

## 2018-09-04 DIAGNOSIS — R51 Headache: Secondary | ICD-10-CM | POA: Diagnosis not present

## 2018-09-04 DIAGNOSIS — R0600 Dyspnea, unspecified: Secondary | ICD-10-CM | POA: Diagnosis not present

## 2018-09-04 DIAGNOSIS — Z87442 Personal history of urinary calculi: Secondary | ICD-10-CM | POA: Diagnosis not present

## 2018-09-04 DIAGNOSIS — Z886 Allergy status to analgesic agent status: Secondary | ICD-10-CM | POA: Diagnosis not present

## 2018-09-04 DIAGNOSIS — Z853 Personal history of malignant neoplasm of breast: Secondary | ICD-10-CM | POA: Diagnosis not present

## 2018-09-04 DIAGNOSIS — Z833 Family history of diabetes mellitus: Secondary | ICD-10-CM | POA: Diagnosis not present

## 2018-09-04 DIAGNOSIS — E119 Type 2 diabetes mellitus without complications: Secondary | ICD-10-CM | POA: Diagnosis not present

## 2018-09-04 DIAGNOSIS — M75101 Unspecified rotator cuff tear or rupture of right shoulder, not specified as traumatic: Secondary | ICD-10-CM | POA: Diagnosis not present

## 2018-09-04 DIAGNOSIS — Z888 Allergy status to other drugs, medicaments and biological substances status: Secondary | ICD-10-CM | POA: Diagnosis not present

## 2018-09-04 DIAGNOSIS — K219 Gastro-esophageal reflux disease without esophagitis: Secondary | ICD-10-CM | POA: Diagnosis not present

## 2018-09-04 DIAGNOSIS — M7541 Impingement syndrome of right shoulder: Secondary | ICD-10-CM | POA: Diagnosis not present

## 2018-09-04 DIAGNOSIS — E785 Hyperlipidemia, unspecified: Secondary | ICD-10-CM | POA: Diagnosis not present

## 2018-09-04 LAB — CBC
HCT: 41.3 % (ref 36.0–46.0)
Hemoglobin: 12.9 g/dL (ref 12.0–15.0)
MCH: 28.9 pg (ref 26.0–34.0)
MCHC: 31.2 g/dL (ref 30.0–36.0)
MCV: 92.4 fL (ref 80.0–100.0)
Platelets: 259 10*3/uL (ref 150–400)
RBC: 4.47 MIL/uL (ref 3.87–5.11)
RDW: 12 % (ref 11.5–15.5)
WBC: 12 10*3/uL — ABNORMAL HIGH (ref 4.0–10.5)
nRBC: 0 % (ref 0.0–0.2)

## 2018-09-04 LAB — BASIC METABOLIC PANEL
Anion gap: 11 (ref 5–15)
BUN: 16 mg/dL (ref 8–23)
CHLORIDE: 103 mmol/L (ref 98–111)
CO2: 22 mmol/L (ref 22–32)
Calcium: 9.3 mg/dL (ref 8.9–10.3)
Creatinine, Ser: 0.74 mg/dL (ref 0.44–1.00)
GFR calc Af Amer: >60 mL/min (ref >60)
GFR calc non Af Amer: >60 mL/min (ref >60)
Glucose, Bld: 149 mg/dL — ABNORMAL HIGH (ref 70–99)
POTASSIUM: 4.3 mmol/L (ref 3.5–5.1)
Sodium: 136 mmol/L (ref 135–145)

## 2018-09-04 NOTE — Progress Notes (Addendum)
   09/04/18 1300  OT Visit Information  Last OT Received On 09/04/18  Assistance Needed +1  History of Present Illness Pt was admitted for R RCR.  Had RCT, adhesive capsulitis and impingement  Precautions  Precautions Shoulder  Type of Shoulder Precautions sling on at all times x adls/pendulums; AROM elbow to fingers  Pain Assessment  Faces Pain Scale 4  Pain Location R shoulder  Pain Descriptors / Indicators Aching  Pain Intervention(s) Limited activity within patient's tolerance;Monitored during session;Premedicated before session;Repositioned;Ice applied  Cognition  Arousal/Alertness Awake/alert  Behavior During Therapy WFL for tasks assessed/performed  Overall Cognitive Status Within Functional Limits for tasks assessed  ADL  General ADL Comments daughter present for family education.  She has hurt her back and did not want to don sling.  She videotaped OT donning/doffing sling and verbalizes understanding. Reviewed protocol with both of them, and OT demonstrated pendulum exercises, which pt had performed earlier.  Restrictions  Other Position/Activity Restrictions RUE NWB  Transfers  Overall transfer level Independent  OT - End of Session  Activity Tolerance Patient tolerated treatment well  Patient left in chair;with call bell/phone within reach;with family/visitor present  OT Assessment/Plan  OT Visit Diagnosis Pain  Pain - Right/Left Right  Pain - part of body Shoulder  OT Frequency (ACUTE ONLY) Min 2X/week  Follow Up Recommendations Follow surgeon's recommendation for DC plan and follow-up therapies  OT Equipment None recommended by OT  AM-PAC OT "6 Clicks" Daily Activity Outcome Measure  Help from another person eating meals? 4  Help from another person taking care of personal grooming? 3  Help from another person toileting, which includes using toliet, bedpan, or urinal? 3  Help from another person bathing (including washing, rinsing, drying)? 3  Help from another  person to put on and taking off regular upper body clothing? 2  Help from another person to put on and taking off regular lower body clothing? 3  6 Click Score 18  ADL G Code Conversion CK  OT Goal Progression  Progress towards OT goals Progressing toward goals (pt will follow up with Dr Tonita Cong)  OT Time Calculation  OT Start Time (ACUTE ONLY) 1313  OT Stop Time (ACUTE ONLY) 1324  OT Time Calculation (min) 15 min  OT General Charges  $OT Visit 1 Visit  OT Treatments  $Self Care/Home Management  8-22 mins  [name], OTR/L Acute Rehabilitation Services [phone] [facility] pager [phone] office 09/04/2018

## 2018-09-04 NOTE — Progress Notes (Signed)
Subjective: 1 Day Post-Op Procedure(s) (LRB): Right shoulder manipulation under anesthesia, exam under anesthesia, mini open rotator cuff repair, subacromial decompression (Right) Patient reports pain as 3 on 0-10 scale.    Objective: Vital signs in last 24 hours: Temp:  [97.5 F (36.4 C)-98.5 F (36.9 C)] 97.5 F (36.4 C) (11/14 0921) Pulse Rate:  [64-72] 64 (11/14 0921) Resp:  [16] 16 (11/14 0921) BP: (128-155)/(50-75) 155/63 (11/14 0921) SpO2:  [94 %-100 %] 100 % (11/14 0921)  Intake/Output from previous day: 11/13 0701 - 11/14 0700 In: 2252 [P.O.:440; I.V.:1612; IV Piggyback:200] Out: 1001 [Urine:1001] Intake/Output this shift: Total I/O In: 240 [P.O.:240] Out: -   Recent Labs    09/04/18 0402  HGB 12.9   Recent Labs    09/04/18 0402  WBC 12.0*  RBC 4.47  HCT 41.3  PLT 259   Recent Labs    09/04/18 0402  NA 136  K 4.3  CL 103  CO2 22  BUN 16  CREATININE 0.74  GLUCOSE 149*  CALCIUM 9.3   No results for input(s): LABPT, INR in the last 72 hours.  Neurologically intact Sensation intact distally Intact pulses distally Incision: dressing C/D/I    Assessment/Plan: 1 Day Post-Op Procedure(s) (LRB): Right shoulder manipulation under anesthesia, exam under anesthesia, mini open rotator cuff repair, subacromial decompression (Right) Advance diet Up with therapy D/C IV fluids Discharge home with home health  instr given    Johnn Hai 09/04/2018, 12:10 PM

## 2018-09-04 NOTE — Progress Notes (Signed)
Patient going home.  Daughter here at bedside and will tx patient home.  D/c instructions given.

## 2018-09-04 NOTE — Evaluation (Signed)
Occupational Therapy Evaluation Patient Details Name: Jordan Willis MRN: [id number] DOB: June 02, 1951 Today's Date: 09/04/2018    History of Present Illness Pt was admitted for R RCR.  Had RCT, adhesive capsulitis and impingement   Clinical Impression   This 67 year old female was admitted for the above sx. At baseline, she is independent. She needs mod to max A for adls at this time due to immobilized RUE. She will have intermittent assistance from friends and family.  Pt got nauseous after performing pendulum exercises. Will return for family education prior to pt's d/c home     Follow Up Recommendations  Follow surgeon's recommendation for DC plan and follow-up therapies    Equipment Recommendations  None recommended by OT    Recommendations for Other Services       Precautions / Restrictions Precautions Precautions: Shoulder Type of Shoulder Precautions: sling on at all times x adls/pendulums; AROM elbow to fingers Restrictions Weight Bearing Restrictions: No Other Position/Activity Restrictions: RUE NWB      Mobility Bed Mobility               General bed mobility comments: supervision with HOB raised  Transfers   Equipment used: None             General transfer comment: supervision    Balance                                           ADL either performed or assessed with clinical judgement   ADL Overall ADL's : Needs assistance/impaired Eating/Feeding: Independent   Grooming: Set up   Upper Body Bathing: Minimal assistance   Lower Body Bathing: Supervison/ safety;Set up   Upper Body Dressing : Maximal assistance   Lower Body Dressing: Moderate assistance                 General ADL Comments: assisted pt with UB dressing; she had already bathed. Reviewed protocol, educated on sponge/net on short stick to wash under L (good) arm.  Pt will have intermittent assistance at home.  Pt verbalizes understanding of protocol.   After standing to perform pendulums, pt felt nauseous.  Reapplied sling, and returned to supine with HOB raised.  Cool cloths provided and RN brought meds. Will need to review sling with son when he comes to pick her up. See education section of chart     Vision         Perception     Praxis      Pertinent Vitals/Pain Pain Assessment: Faces Faces Pain Scale: Hurts even more Pain Location: R shoulder Pain Descriptors / Indicators: Aching Pain Intervention(s): Limited activity within patient's tolerance;Monitored during session;Premedicated before session;Repositioned     Hand Dominance Right   Extremity/Trunk Assessment Upper Extremity Assessment Upper Extremity Assessment: RUE deficits/detail(immobilized)           Communication Communication Communication: No difficulties   Cognition Arousal/Alertness: Awake/alert Behavior During Therapy: WFL for tasks assessed/performed Overall Cognitive Status: Within Functional Limits for tasks assessed                                     General Comments  extended thumb loop on sling for better support and to help keep sling in place. Pt has very little support from sling when I arrived  Exercises Exercises: (performed pendulum exercises)   Shoulder Instructions      Home Living Family/patient expects to be discharged to:: Private residence Living Arrangements: Alone Available Help at Discharge: Friend(s);Family               Bathroom Shower/Tub: Occupational psychologist: Standard         Additional Comments: has access to shower seat      Prior Functioning/Environment Level of Independence: Independent                 OT Problem List: Decreased activity tolerance;Pain;Decreased knowledge of precautions      OT Treatment/Interventions: Self-care/ADL training;DME and/or AE instruction;Patient/family education    OT Goals(Current goals can be found in the care plan section)  Acute Rehab OT Goals Patient Stated Goal: return to independence OT Goal Formulation: With patient Time For Goal Achievement: 09/11/18 Potential to Achieve Goals: Good ADL Goals Additional ADL Goal #1: family will don sling with supervision and verbalize understanding of precautions, pendulums and assist during adls  OT Frequency: Min 2X/week   Barriers to D/C:            Co-evaluation              AM-PAC PT "6 Clicks" Daily Activity     Outcome Measure Help from another person eating meals?: None Help from another person taking care of personal grooming?: A Little Help from another person toileting, which includes using toliet, bedpan, or urinal?: A Little Help from another person bathing (including washing, rinsing, drying)?: A Little Help from another person to put on and taking off regular upper body clothing?: A Lot Help from another person to put on and taking off regular lower body clothing?: A Little 6 Click Score: 18   End of Session    Activity Tolerance: Treatment limited secondary to medical complications (Comment)(nausea) Patient left: in bed;with call bell/phone within reach  OT Visit Diagnosis: Pain Pain - Right/Left: Right Pain - part of body: Shoulder                Time: 9373-4287 OT Time Calculation (min): 26 min Charges:  OT General Charges $OT Visit: 1 Visit OT Evaluation $OT Eval Low Complexity: 1 Low OT Treatments $Self Care/Home Management : 8-22 mins  Jordan Willis, OTR/L Acute Rehabilitation Services [phone] [facility] pager [phone] office 09/04/2018  Jordan Willis 09/04/2018, 11:28 AM

## 2018-09-04 NOTE — Discharge Summary (Signed)
Patient ID: Jordan Willis MRN: [id number] DOB/AGE: 06/13/1951 67 y.o.  Admit date: 09/03/2018 Discharge date: 09/04/2018  Admission Diagnoses:  Active Problems:   Complete rotator cuff tear   Discharge Diagnoses:  Same  Past Medical History:  Diagnosis Date  . Anxiety   . Arthritis    Lumbar spine DDD.  Marland Kitchen Breast cancer, female, left 03/03/2012  . Carpal tunnel syndrome    Bilateral, Mild  . Chronic sinusitis   . 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 Diabetes mellitus without complication ([place])    Diet controlled  . Dyspnea    When patient gets sick uses Pro-Air inhaler  . Frequent sinus infections   . GERD (gastroesophageal reflux disease)    occ  . Goiter   . History of cardiomegaly   . History of cataract    Bilateral  . History of kidney stones    08/28/2018 currently has a large kidney stone, asymptomatic at this time  . History of migraine   . History of uterine fibroid   . History of uterine prolapse   . Hyperlipidemia   . Menopause 03/03/2012  . Nasal turbinate hypertrophy 11/29/2016   Left inferior   . Personal history of chemotherapy   . Personal history of radiation therapy   . PONV (postoperative nausea and vomiting)   . Sinusitis 2014  . Sleep apnea    No CPAP  . SVD (spontaneous vaginal delivery)    x 2  . Thyroid nodule     Surgeries: Procedure(s): Right shoulder manipulation under anesthesia, exam under anesthesia, mini open rotator cuff repair, subacromial decompression on 09/03/2018   Consultants:   Discharged Condition: Improved  Hospital Course: Jordan Willis is an 67 y.o. female who was admitted 09/03/2018 for operative treatment of<principal problem not specified>. Patient has severe unremitting pain that affects sleep, daily activities, and work/hobbies. After pre-op clearance the patient was taken to the operating room on 09/03/2018 and underwent  Procedure(s): Right shoulder manipulation under anesthesia, exam  under anesthesia, mini open rotator cuff repair, subacromial decompression.    Patient was given perioperative antibiotics:  Anti-infectives (From admission, onward)   Start     Dose/Rate Route Frequency Ordered Stop   09/03/18 1500  ceFAZolin (ANCEF) IVPB 2g/100 mL premix     2 g 200 mL/hr over 30 Minutes Intravenous Every 6 hours 09/03/18 1044 09/04/18 0901   09/03/18 0930  polymyxin B 500,000 Units, bacitracin 50,000 Units in sodium chloride 0.9 % 500 mL irrigation  Status:  Discontinued       As needed 09/03/18 0930 09/03/18 0955   09/03/18 0700  ceFAZolin (ANCEF) IVPB 2g/100 mL premix     2 g 200 mL/hr over 30 Minutes Intravenous On call to O.R. 09/03/18 6606 09/03/18 0856       Patient was given sequential compression devices, early ambulation, and chemoprophylaxis to prevent DVT.  Patient benefited maximally from hospital stay and there were no complications.    Recent vital signs:  Patient Vitals for the past 24 hrs:  BP Temp Temp src Pulse Resp SpO2  09/04/18 0921 (!) 155/63 (!) 97.5 F (36.4 C) Oral 64 16 100 %  09/04/18 0501 128/61 98 F (36.7 C) Oral 65 16 94 %  09/04/18 0035 (!) 130/50 98.5 F (36.9 C) Oral 64 - 96 %  09/03/18 2053 131/68 98.2 F (36.8 C) Oral 69 16 96 %  09/03/18 1752 136/64 98.4 F (36.9 C) Oral 72 16 99 %  09/03/18 1359 (!) 144/65 98.2 F (36.8 C) Oral 71 16 97 %  09/03/18 1353 (!) 144/65 98.2 F (36.8 C) Oral 69 16 97 %     Recent laboratory studies:  Recent Labs    09/04/18 0402  WBC 12.0*  HGB 12.9  HCT 41.3  PLT 259  NA 136  K 4.3  CL 103  CO2 22  BUN 16  CREATININE 0.74  GLUCOSE 149*  CALCIUM 9.3     Discharge Medications:   Allergies as of 09/04/2018      Reactions   Aspirin Palpitations, Other (See Comments)   Heart fluttering    Tape Rash      Medication List    STOP taking these medications   ELDERBERRY PO   vitamin E 200 UNIT capsule     TAKE these medications   atorvastatin 10 MG tablet Commonly  known as:  LIPITOR Take 10 mg by mouth daily.   b complex vitamins tablet Take 1 tablet by mouth daily.   CALCIUM 600 PO Take 600 mg by mouth daily.   cetirizine 10 MG tablet Commonly known as:  ZYRTEC Take 10 mg by mouth daily.   Chromium Picolinate 1000 MCG Tabs Take 1,000 mcg by mouth daily.   docusate sodium 100 MG capsule Commonly known as:  COLACE Take 1 capsule (100 mg total) by mouth 2 (two) times daily.   GLUCOSAMINE-CHONDROITIN PO Take 1 tablet by mouth daily.   HYDROcodone-acetaminophen 5-325 MG tablet Commonly known as:  NORCO/VICODIN Take 1-2 tablets by mouth every 6 (six) hours as needed for moderate pain.   multivitamin with minerals Tabs tablet Take 1 tablet by mouth daily.   PROAIR HFA 108 (90 Base) MCG/ACT inhaler Generic drug:  albuterol Inhale 2 puffs into the lungs every 6 (six) hours as needed for wheezing or shortness of breath.   sertraline 50 MG tablet Commonly known as:  ZOLOFT Take 25 mg by mouth daily.   solifenacin 10 MG tablet Commonly known as:  VESICARE Take 10 mg by mouth daily.   traZODone 50 MG tablet Commonly known as:  DESYREL Take 25-50 mg by mouth at bedtime as needed for sleep.   ULTRA LUBRICANT EYE DROPS OP Place 1 drop into both eyes daily as needed (for dry eyes).   Vitamin D3 50 MCG (2000 UT) Tabs Take 2,000 Units by mouth daily.       Diagnostic Studies: No results found.  Disposition: Discharge disposition: 01-Home or Self Care       Discharge Instructions    Call MD / Call 911   Complete by:  As directed    If you experience chest pain or shortness of breath, CALL 911 and be transported to the hospital emergency room.  If you develope a fever above 101 F, pus (white drainage) or increased drainage or redness at the wound, or calf pain, call your surgeon's office.   Constipation Prevention   Complete by:  As directed    Drink plenty of fluids.  Prune juice may be helpful.  You may use a stool softener,  such as Colace (over the counter) 100 mg twice a day.  Use MiraLax (over the counter) for constipation as needed.   Diet - low sodium heart healthy   Complete by:  As directed    Increase activity slowly as tolerated   Complete by:  As directed       Follow-up Information    Susa Day, MD Follow up in 2  week(s).   Specialty:  Orthopedic Surgery Contact information: [address] [place] [address] [phone]            Signed: Cecilie Kicks. 09/04/2018, 1:06 PM

## 2018-09-04 NOTE — Anesthesia Postprocedure Evaluation (Signed)
Anesthesia Post Note  Patient: Jordan Willis  Procedure(s) Performed: Right shoulder manipulation under anesthesia, exam under anesthesia, mini open rotator cuff repair, subacromial decompression (Right Shoulder)     Patient location during evaluation: PACU Anesthesia Type: General Level of consciousness: awake and alert Pain management: pain level controlled Vital Signs Assessment: post-procedure vital signs reviewed and stable Respiratory status: spontaneous breathing, nonlabored ventilation, respiratory function stable and patient connected to nasal cannula oxygen Cardiovascular status: blood pressure returned to baseline and stable Postop Assessment: no apparent nausea or vomiting Anesthetic complications: no    Last Vitals:  Vitals:   09/04/18 0035 09/04/18 0501  BP: (!) 130/50 128/61  Pulse: 64 65  Resp:  16  Temp: 36.9 C 36.7 C  SpO2: 96% 94%    Last Pain:  Vitals:   09/04/18 4492  TempSrc:   PainSc: 6                  [name]

## 2018-09-09 ENCOUNTER — Encounter (HOSPITAL_COMMUNITY): Payer: Self-pay | Admitting: Specialist

## 2018-10-16 DIAGNOSIS — N6310 Unspecified lump in the right breast, unspecified quadrant: Secondary | ICD-10-CM | POA: Diagnosis not present

## 2018-10-20 DIAGNOSIS — Z853 Personal history of malignant neoplasm of breast: Secondary | ICD-10-CM | POA: Diagnosis not present

## 2018-10-23 DIAGNOSIS — E1165 Type 2 diabetes mellitus with hyperglycemia: Secondary | ICD-10-CM | POA: Diagnosis not present

## 2018-10-23 DIAGNOSIS — E059 Thyrotoxicosis, 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: Secondary | ICD-10-CM | POA: Diagnosis not present

## 2018-10-24 DIAGNOSIS — M25511 Pain in right shoulder: Secondary | ICD-10-CM | POA: Diagnosis not present

## 2018-10-28 DIAGNOSIS — M25511 Pain in right shoulder: Secondary | ICD-10-CM | POA: Diagnosis not present

## 2018-10-29 DIAGNOSIS — Z923 Personal history of irradiation: Secondary | ICD-10-CM | POA: Diagnosis not present

## 2018-10-29 DIAGNOSIS — E042 Nontoxic multinodular goiter: Secondary | ICD-10-CM | POA: Diagnosis not present

## 2018-10-29 DIAGNOSIS — E1165 Type 2 diabetes mellitus with hyperglycemia: Secondary | ICD-10-CM | POA: Diagnosis not present

## 2018-10-29 DIAGNOSIS — E059 Thyrotoxicosis, 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: Secondary | ICD-10-CM | POA: Diagnosis not present

## 2018-10-30 DIAGNOSIS — M25511 Pain in right shoulder: Secondary | ICD-10-CM | POA: Diagnosis not present

## 2018-10-30 DIAGNOSIS — J209 Acute bronchitis, unspecified: Secondary | ICD-10-CM | POA: Diagnosis not present

## 2018-10-30 DIAGNOSIS — J0100 Acute maxillary sinusitis, unspecified: Secondary | ICD-10-CM | POA: Diagnosis not present

## 2018-11-04 DIAGNOSIS — M25511 Pain in right shoulder: Secondary | ICD-10-CM | POA: Diagnosis not present

## 2018-11-04 DIAGNOSIS — Z853 Personal history of malignant neoplasm of breast: Secondary | ICD-10-CM | POA: Diagnosis not present

## 2018-11-05 DIAGNOSIS — N6312 Unspecified lump in the right breast, upper inner quadrant: Secondary | ICD-10-CM | POA: Diagnosis not present

## 2018-11-05 DIAGNOSIS — N6310 Unspecified lump in the right breast, unspecified quadrant: Secondary | ICD-10-CM | POA: Diagnosis not present

## 2018-11-05 DIAGNOSIS — Z853 Personal history of malignant neoplasm of breast: Secondary | ICD-10-CM | POA: Diagnosis not present

## 2018-11-06 DIAGNOSIS — M25511 Pain in right shoulder: Secondary | ICD-10-CM | POA: Diagnosis not present

## 2018-11-11 DIAGNOSIS — M25511 Pain in right shoulder: Secondary | ICD-10-CM | POA: Diagnosis not present

## 2018-11-13 DIAGNOSIS — M25511 Pain in right shoulder: Secondary | ICD-10-CM | POA: Diagnosis not present

## 2018-11-18 DIAGNOSIS — M25511 Pain in right shoulder: Secondary | ICD-10-CM | POA: Diagnosis not present

## 2018-11-20 DIAGNOSIS — M25511 Pain in right shoulder: Secondary | ICD-10-CM | POA: Diagnosis not present

## 2018-11-25 DIAGNOSIS — M25511 Pain in right shoulder: Secondary | ICD-10-CM | POA: Diagnosis not present

## 2018-11-27 DIAGNOSIS — M7541 Impingement syndrome of right shoulder: Secondary | ICD-10-CM | POA: Diagnosis not present

## 2018-11-28 DIAGNOSIS — M25511 Pain in right shoulder: Secondary | ICD-10-CM | POA: Diagnosis not present

## 2018-12-01 DIAGNOSIS — M25511 Pain in right shoulder: Secondary | ICD-10-CM | POA: Diagnosis not present

## 2019-01-13 DIAGNOSIS — Z9889 Other specified postprocedural states: Secondary | ICD-10-CM | POA: Diagnosis not present

## 2019-01-13 DIAGNOSIS — M25511 Pain in right shoulder: Secondary | ICD-10-CM | POA: Diagnosis not present

## 2019-02-05 DIAGNOSIS — Z0000 Encounter for general adult medical examination without abnormal findings: Secondary | ICD-10-CM | POA: Diagnosis not present

## 2019-02-05 DIAGNOSIS — E1165 Type 2 diabetes mellitus with hyperglycemia: Secondary | ICD-10-CM | POA: Diagnosis not present

## 2019-02-05 DIAGNOSIS — M8589 Other specified disorders of bone density and structure, multiple sites: Secondary | ICD-10-CM | POA: Diagnosis not present

## 2019-02-10 DIAGNOSIS — Z0001 Encounter for general adult medical examination with abnormal findings: Secondary | ICD-10-CM | POA: Diagnosis not present

## 2019-02-10 DIAGNOSIS — E7800 Pure hypercholesterolemia, unspecified: Secondary | ICD-10-CM | POA: Diagnosis not present

## 2019-02-10 DIAGNOSIS — E1165 Type 2 diabetes mellitus with hyperglycemia: Secondary | ICD-10-CM | POA: Diagnosis not present

## 2019-02-10 DIAGNOSIS — Z853 Personal history of malignant neoplasm of breast: Secondary | ICD-10-CM | POA: Diagnosis not present

## 2019-02-10 DIAGNOSIS — M545 Low back pain: Secondary | ICD-10-CM | POA: Diagnosis not present

## 2019-03-03 DIAGNOSIS — M8580 Other specified disorders of bone density and structure, unspecified site: Secondary | ICD-10-CM | POA: Diagnosis not present

## 2019-03-03 DIAGNOSIS — E559 Vitamin D deficiency, unspecified: Secondary | ICD-10-CM | POA: Diagnosis not present

## 2019-04-13 DIAGNOSIS — C50919 Malignant neoplasm of unspecified site of unspecified female breast: Secondary | ICD-10-CM | POA: Diagnosis not present

## 2019-04-13 DIAGNOSIS — R3915 Urgency of urination: Secondary | ICD-10-CM | POA: Diagnosis not present

## 2019-04-13 DIAGNOSIS — Z1231 Encounter for screening mammogram for malignant neoplasm of breast: Secondary | ICD-10-CM | POA: Diagnosis not present

## 2019-05-07 DIAGNOSIS — Z86010 Personal history of colonic polyps: Secondary | ICD-10-CM | POA: Diagnosis not present

## 2019-05-07 DIAGNOSIS — K219 Gastro-esophageal reflux disease without esophagitis: Secondary | ICD-10-CM | POA: Diagnosis not present

## 2019-05-07 DIAGNOSIS — Z1211 Encounter for screening for malignant neoplasm of colon: Secondary | ICD-10-CM | POA: Diagnosis not present

## 2019-06-07 DIAGNOSIS — M79642 Pain in left hand: Secondary | ICD-10-CM | POA: Diagnosis not present

## 2019-06-26 DIAGNOSIS — D128 Benign neoplasm of rectum: Secondary | ICD-10-CM | POA: Diagnosis not present

## 2019-06-26 DIAGNOSIS — Z86010 Personal history of colonic polyps: Secondary | ICD-10-CM | POA: Diagnosis not present

## 2019-06-26 DIAGNOSIS — Z1211 Encounter for screening for malignant neoplasm of colon: Secondary | ICD-10-CM | POA: Diagnosis not present

## 2019-06-26 DIAGNOSIS — K635 Polyp of colon: Secondary | ICD-10-CM | POA: Diagnosis not present

## 2019-06-26 DIAGNOSIS — D127 Benign neoplasm of rectosigmoid junction: Secondary | ICD-10-CM | POA: Diagnosis not present

## 2019-06-26 DIAGNOSIS — K621 Rectal polyp: Secondary | ICD-10-CM | POA: Diagnosis not present

## 2019-06-26 DIAGNOSIS — D120 Benign neoplasm of cecum: Secondary | ICD-10-CM | POA: Diagnosis not present

## 2019-07-09 DIAGNOSIS — J309 Allergic rhinitis, unspecified: Secondary | ICD-10-CM | POA: Diagnosis not present

## 2019-07-10 ENCOUNTER — Other Ambulatory Visit: Payer: Self-pay

## 2019-07-10 DIAGNOSIS — R6889 Other general symptoms and signs: Secondary | ICD-10-CM | POA: Diagnosis not present

## 2019-07-10 DIAGNOSIS — Z20822 Contact with and (suspected) exposure to covid-19: Secondary | ICD-10-CM

## 2019-07-11 LAB — NOVEL CORONAVIRUS, NAA: SARS-CoV-2, NAA: NOT DETECTED

## 2019-07-27 IMAGING — US US THYROID
1 series · 13 of 25 positions shown · non-contrast
Comparison: Prior thyroid ultrasound 02/18/2017; prior thyroid
biopsy 04/23/2017

CLINICAL DATA: Goiter. 66-year-old female with history of thyroid
nodules. Patient previously underwent biopsy of the left inferior
thyroid nodule on 04/23/2017

EXAM:
THYROID ULTRASOUND
TECHNIQUE: Ultrasound examination of the thyroid gland and adjacent soft
tissues was performed.

[Series 1: us thyroid · 0.06mm/px · 13 of 55 slices shown]
[im 1/55]
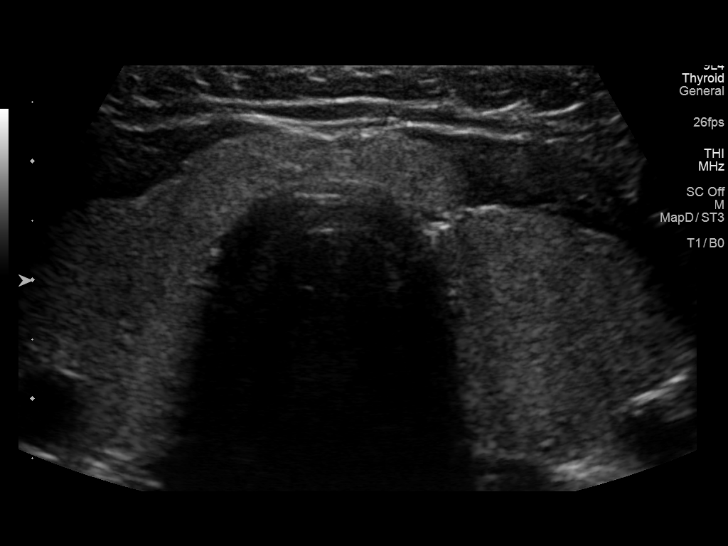
[im 5/55]
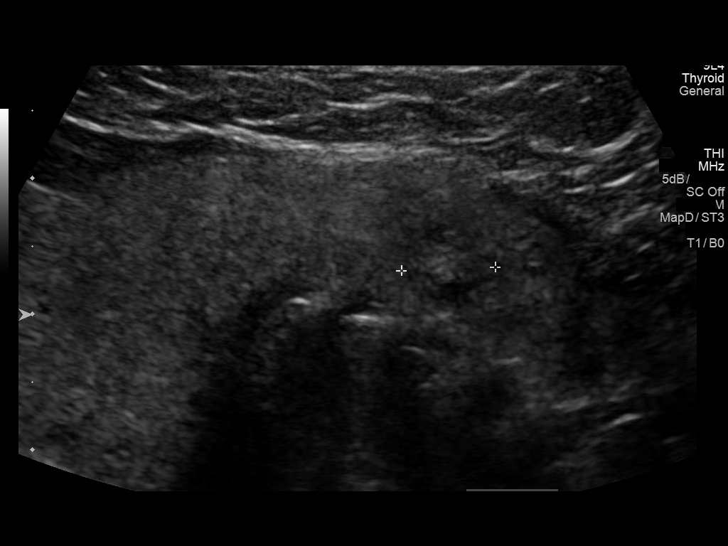
[im 10/55]
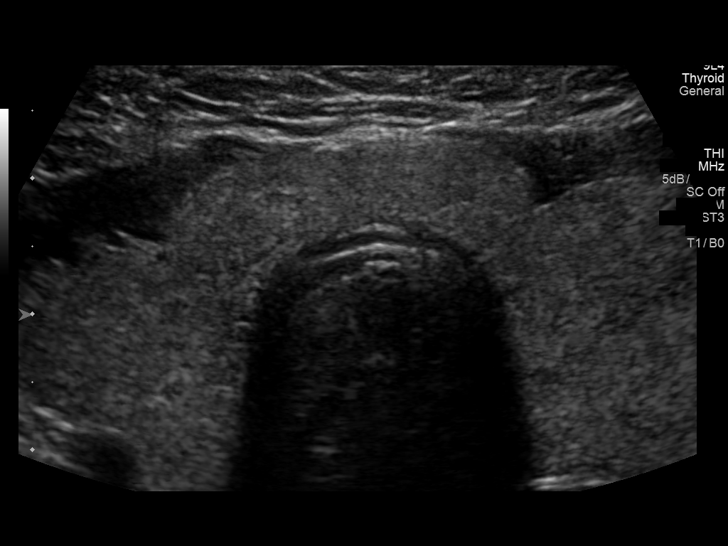
[im 14/55]
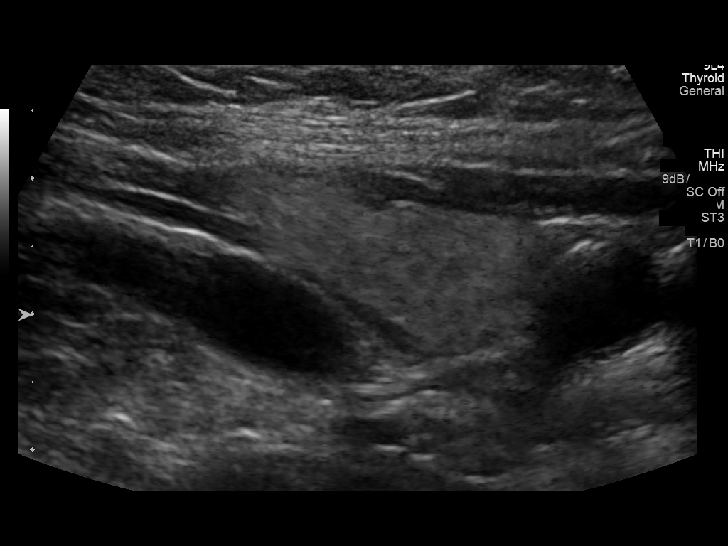
[im 19/55]
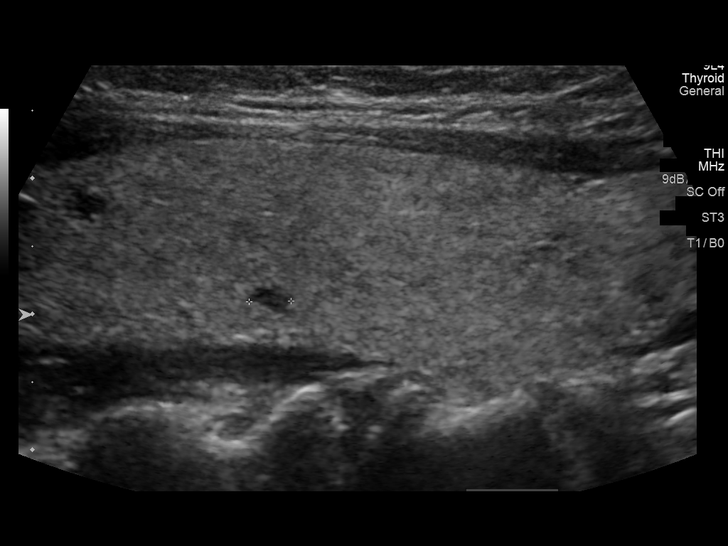
[im 23/55]
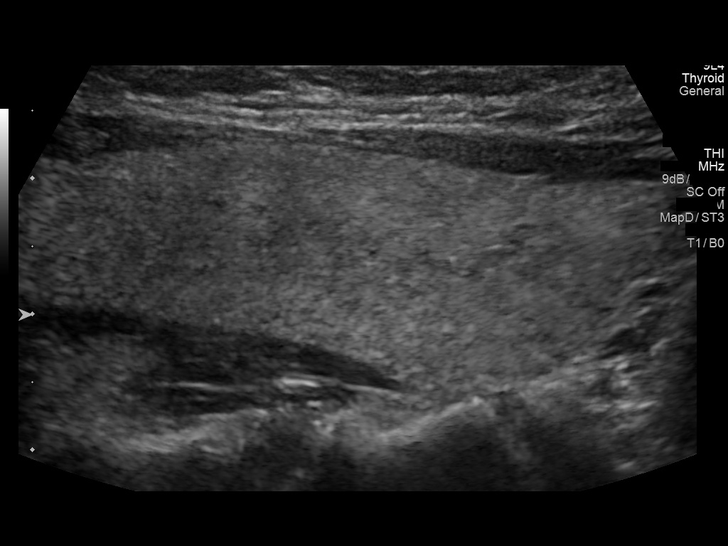
[im 28/55]
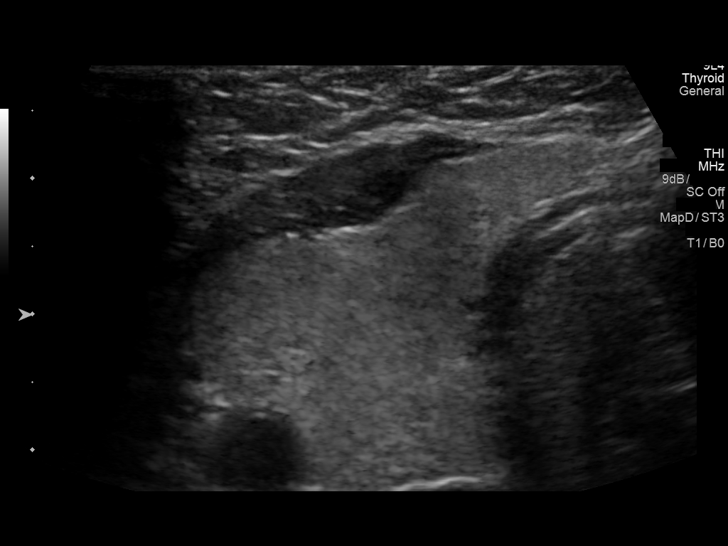
[im 32/55]
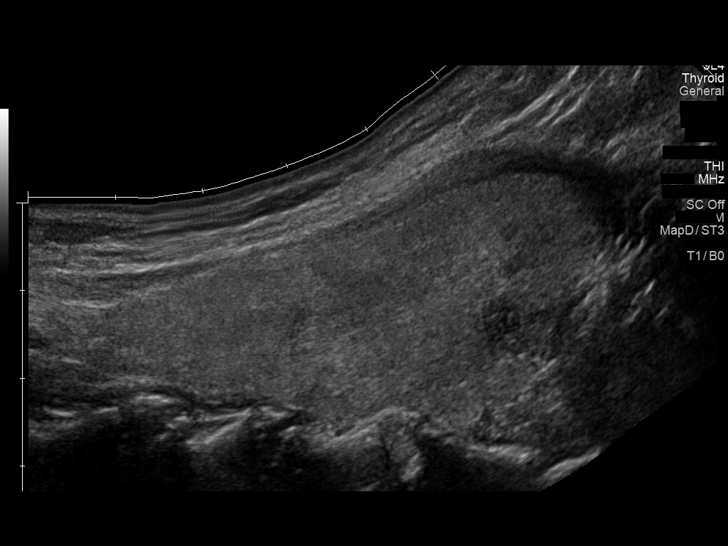
[im 37/55]
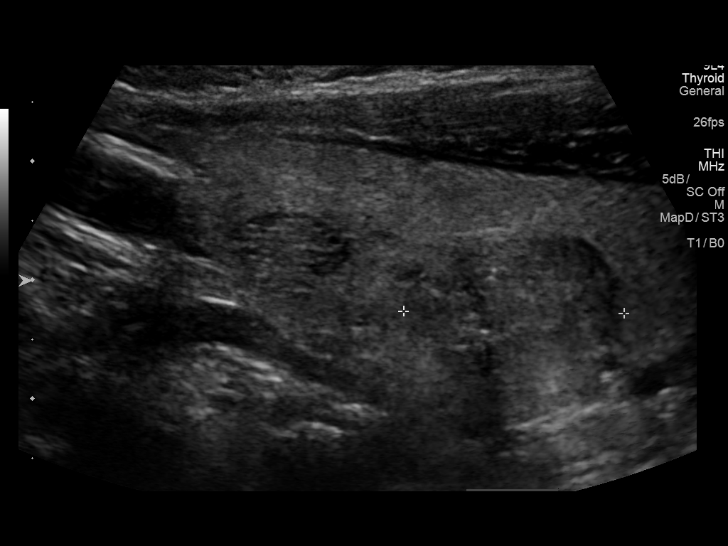
[im 41/55]
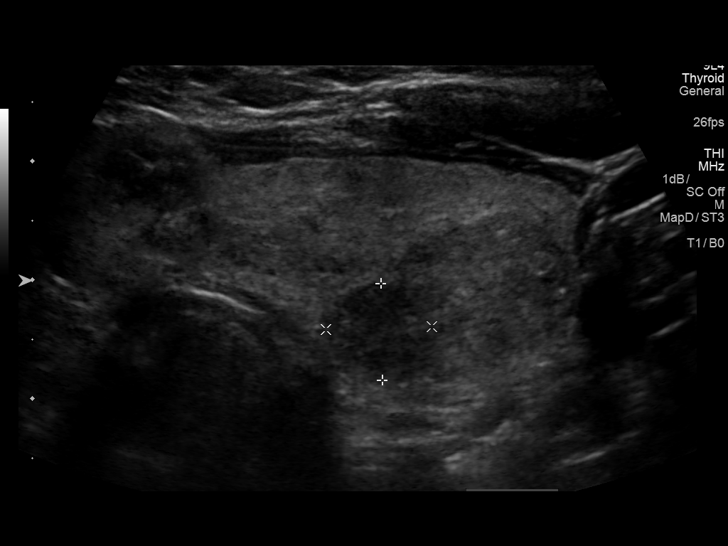
[im 46/55]
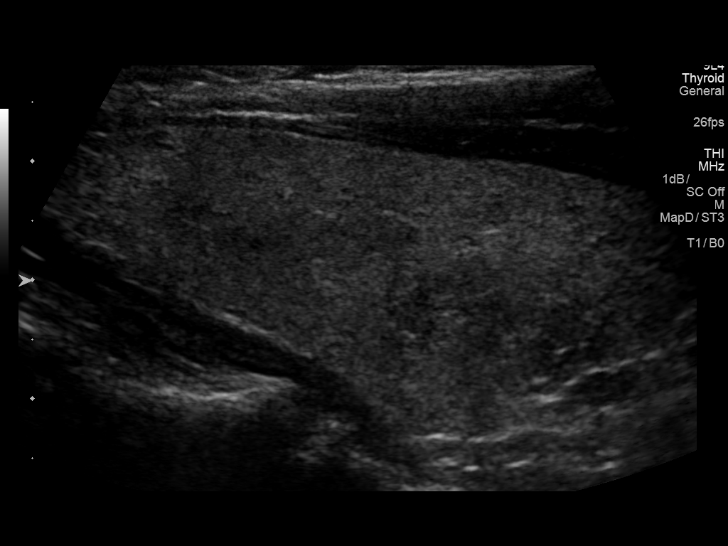
[im 50/55]
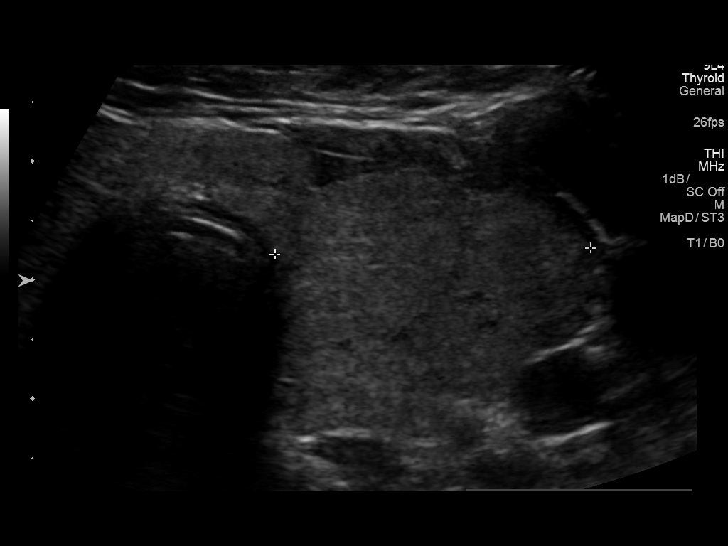
[im 55/55]
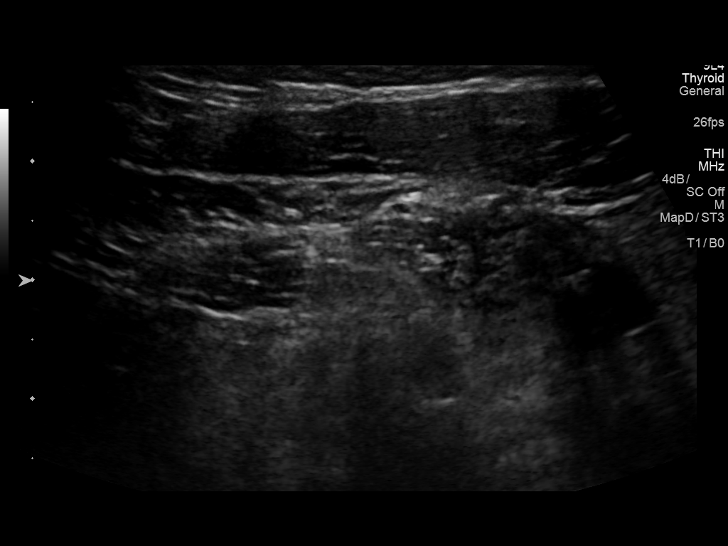

[13 of 25 positions shown; findings below may reference images not displayed]

FINDINGS: Parenchymal Echotexture: Moderately heterogenous

Isthmus: 0.7 cm

Right lobe: 5.9 x 2.2 x 2.2 cm

Left lobe: 6.6 x 2.7 x 2.7 cm

_________________________________________________________

Estimated total number of nodules >/= 1 cm: 3

Number of spongiform nodules >/=  2 cm not described below (TR1): 0

Number of mixed cystic and solid nodules >/= 1.5 cm not described
below (TR2): 0

_________________________________________________________

Nodule # 1:

Prior biopsy: No

Location: Isthmus; Mid

Maximum size: 1.2 cm; Other 2 dimensions: 0.8 x 1.1 cm, previously,
1.1 x 0.7 x 0.9 cm

Composition: solid/almost completely solid (2)

Echogenicity: hypoechoic (2)

Shape: not taller-than-wide (0)

Margins: smooth (0)

Echogenic foci: none (0)

ACR TI-RADS total points: 4.

ACR TI-RADS risk category:  TR4 (4-6 points).

Significant change in size (>/= 20% in two dimensions and minimal
increase of 2 mm): No

Change in features: No

Change in ACR TI-RADS risk category: No

ACR TI-RADS recommendations:

*Given size (>/= 1 - 1.4 cm) and appearance, a follow-up ultrasound
in 1 year should be considered based on TI-RADS criteria.

_________________________________________________________

The previously biopsied nodule in the left mid to lower gland
measures 1.9 x 1.4 x 1.5 cm, insignificantly changed compared to
prior imaging. Additional small isthmic and left-sided thyroid
nodules demonstrate no significant interval change compared to prior
imaging and again do not warrant further evaluation.
IMPRESSION: 1. Confirmed 1 year stability of a 1.2 cm TI-RADS category 4 nodule
in the mid isthmus. This nodule continues to meet surveillance for
dedicated annual ultrasound follow-up until 5 year stability has
been confirmed.
2. No significant interval change in the size or appearance of the
previously biopsied nodule in the left mid to lower gland.

The above is in keeping with the ACR TI-RADS recommendations - [HOSPITAL] 3673;[DATE].

## 2019-09-01 DIAGNOSIS — C50911 Malignant neoplasm of unspecified site of right female breast: Secondary | ICD-10-CM | POA: Diagnosis not present

## 2019-09-01 DIAGNOSIS — Z9012 Acquired absence of left breast and nipple: Secondary | ICD-10-CM | POA: Diagnosis not present

## 2019-09-08 DIAGNOSIS — M545 Low back pain: Secondary | ICD-10-CM | POA: Diagnosis not present

## 2019-09-08 DIAGNOSIS — M6281 Muscle weakness (generalized): Secondary | ICD-10-CM | POA: Diagnosis not present

## 2019-09-08 DIAGNOSIS — M25511 Pain in right shoulder: Secondary | ICD-10-CM | POA: Diagnosis not present

## 2019-09-10 DIAGNOSIS — M6281 Muscle weakness (generalized): Secondary | ICD-10-CM | POA: Diagnosis not present

## 2019-09-10 DIAGNOSIS — M545 Low back pain: Secondary | ICD-10-CM | POA: Diagnosis not present

## 2019-09-10 DIAGNOSIS — M25511 Pain in right shoulder: Secondary | ICD-10-CM | POA: Diagnosis not present

## 2019-09-15 DIAGNOSIS — J209 Acute bronchitis, unspecified: Secondary | ICD-10-CM | POA: Diagnosis not present

## 2019-09-21 DIAGNOSIS — M545 Low back pain: Secondary | ICD-10-CM | POA: Diagnosis not present

## 2019-09-21 DIAGNOSIS — M6281 Muscle weakness (generalized): Secondary | ICD-10-CM | POA: Diagnosis not present

## 2019-09-21 DIAGNOSIS — M25511 Pain in right shoulder: Secondary | ICD-10-CM | POA: Diagnosis not present

## 2019-09-29 DIAGNOSIS — K219 Gastro-esophageal reflux disease without esophagitis: Secondary | ICD-10-CM | POA: Diagnosis not present

## 2019-09-29 DIAGNOSIS — R1310 Dysphagia, unspecified: Secondary | ICD-10-CM | POA: Diagnosis not present

## 2019-09-29 DIAGNOSIS — Z86010 Personal history of colonic polyps: Secondary | ICD-10-CM | POA: Diagnosis not present

## 2019-10-09 ENCOUNTER — Other Ambulatory Visit: Payer: Self-pay | Admitting: Endocrinology

## 2019-10-09 DIAGNOSIS — E042 Nontoxic multinodular goiter: Secondary | ICD-10-CM

## 2019-10-20 DIAGNOSIS — J0100 Acute maxillary sinusitis, unspecified: Secondary | ICD-10-CM | POA: Diagnosis not present

## 2019-10-21 DIAGNOSIS — E1165 Type 2 diabetes mellitus with hyperglycemia: Secondary | ICD-10-CM | POA: Diagnosis not present

## 2019-10-21 DIAGNOSIS — E059 Thyrotoxicosis, 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: Secondary | ICD-10-CM | POA: Diagnosis not present

## 2019-10-28 DIAGNOSIS — E1165 Type 2 diabetes mellitus with hyperglycemia: Secondary | ICD-10-CM | POA: Diagnosis not present

## 2019-10-28 DIAGNOSIS — E059 Thyrotoxicosis, 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: Secondary | ICD-10-CM | POA: Diagnosis not present

## 2019-10-28 DIAGNOSIS — E7800 Pure hypercholesterolemia, unspecified: Secondary | ICD-10-CM | POA: Diagnosis not present

## 2019-10-28 DIAGNOSIS — E042 Nontoxic multinodular goiter: Secondary | ICD-10-CM | POA: Diagnosis not present

## 2019-10-29 ENCOUNTER — Ambulatory Visit
Admission: RE | Admit: 2019-10-29 | Discharge: 2019-10-29 | Disposition: A | Discharge status: Home / Self Care | Payer: Medicare Other | Source: Ambulatory Visit | Attending: Endocrinology | Admitting: Endocrinology

## 2019-10-29 DIAGNOSIS — E041 Nontoxic single thyroid nodule: Secondary | ICD-10-CM | POA: Diagnosis not present

## 2019-10-29 DIAGNOSIS — E042 Nontoxic multinodular goiter: Secondary | ICD-10-CM

## 2020-01-05 ENCOUNTER — Emergency Department (HOSPITAL_COMMUNITY): Payer: Medicare Other

## 2020-01-05 ENCOUNTER — Encounter (HOSPITAL_COMMUNITY): Payer: Self-pay | Admitting: Emergency Medicine

## 2020-01-05 ENCOUNTER — Emergency Department (HOSPITAL_COMMUNITY)
Admission: EM | Admit: 2020-01-05 | Discharge: 2020-01-05 | Disposition: A | Discharge status: Home / Self Care | Payer: Medicare Other | Attending: Emergency Medicine | Admitting: Emergency Medicine

## 2020-01-05 ENCOUNTER — Other Ambulatory Visit: Payer: Self-pay

## 2020-01-05 DIAGNOSIS — I447 Left bundle-branch block, unspecified: Secondary | ICD-10-CM | POA: Diagnosis not present

## 2020-01-05 DIAGNOSIS — Z743 Need for continuous supervision: Secondary | ICD-10-CM | POA: Diagnosis not present

## 2020-01-05 DIAGNOSIS — E119 Type 2 diabetes mellitus without complications: Secondary | ICD-10-CM | POA: Insufficient documentation

## 2020-01-05 DIAGNOSIS — R0789 Other chest pain: Secondary | ICD-10-CM | POA: Diagnosis not present

## 2020-01-05 DIAGNOSIS — Z79899 Other long term (current) drug therapy: Secondary | ICD-10-CM | POA: Insufficient documentation

## 2020-01-05 DIAGNOSIS — R0602 Shortness of breath: Secondary | ICD-10-CM | POA: Diagnosis not present

## 2020-01-05 DIAGNOSIS — R079 Chest pain, unspecified: Secondary | ICD-10-CM | POA: Diagnosis not present

## 2020-01-05 LAB — CBC
HCT: 39.4 % (ref 36.0–46.0)
Hemoglobin: 12.5 g/dL (ref 12.0–15.0)
MCH: 28.9 pg (ref 26.0–34.0)
MCHC: 31.7 g/dL (ref 30.0–36.0)
MCV: 91 fL (ref 80.0–100.0)
Platelets: 272 10*3/uL (ref 150–400)
RBC: 4.33 MIL/uL (ref 3.87–5.11)
RDW: 12.6 % (ref 11.5–15.5)
WBC: 5.8 10*3/uL (ref 4.0–10.5)
nRBC: 0 % (ref 0.0–0.2)

## 2020-01-05 LAB — TROPONIN I (HIGH SENSITIVITY): Troponin I (High Sensitivity): 5 ng/L (ref <18)

## 2020-01-05 LAB — CBG MONITORING, ED: Glucose-Capillary: 82 mg/dL (ref 70–99)

## 2020-01-05 LAB — BASIC METABOLIC PANEL
Anion gap: 10 (ref 5–15)
BUN: 9 mg/dL (ref 8–23)
CO2: 26 mmol/L (ref 22–32)
Calcium: 9.5 mg/dL (ref 8.9–10.3)
Chloride: 103 mmol/L (ref 98–111)
Creatinine, Ser: 0.57 mg/dL (ref 0.44–1.00)
GFR calc Af Amer: >60 mL/min (ref >60)
GFR calc non Af Amer: >60 mL/min (ref >60)
Glucose, Bld: 96 mg/dL (ref 70–99)
Potassium: 3.5 mmol/L (ref 3.5–5.1)
Sodium: 139 mmol/L (ref 135–145)

## 2020-01-05 MED ORDER — SODIUM CHLORIDE 0.9% FLUSH: 3.0000 mL | Freq: Once | INTRAVENOUS | Status: DC

## 2020-01-05 NOTE — Discharge Instructions (Addendum)
Return here as needed. Follow up with  the Cardiologist.

## 2020-01-05 NOTE — ED Triage Notes (Signed)
Pt arrives to ED from home with complaints of dull centralized chest pain for the pat six days. Patient states that her sister past away six days ago and she has been anxious and having a hard time dealing with it.

## 2020-01-05 NOTE — ED Notes (Signed)
Discharge instructions reviewed with pt. Pt verbalized understanding.   

## 2020-01-06 ENCOUNTER — Ambulatory Visit: Payer: Medicare Other | Admitting: Cardiology

## 2020-01-07 NOTE — ED Provider Notes (Signed)
[facility] EMERGENCY DEPARTMENT Provider Note   CSN: [id number] Arrival date & time: 01/05/20  1524     History Chief Complaint  Patient presents with  . Chest Pain    Jordan Willis is a 69 y.o. female.  HPI Patient presents to the emergency department with constant chest pain over the last 6 days.  The patient states that her sister passed away 6 days ago and this pain started the same day that she passed away.  The patient states the pain does not go completely away.  The patient states that she has not taken any medications prior to arrival for her symptoms.  The patient states that she does not have any other significant symptoms with this.  She states she does feel very anxious and this started after the death of her sister.    Past Medical History:  Diagnosis Date  . Anxiety   . Arthritis    Lumbar spine DDD.  Marland Kitchen Breast cancer, female, left 03/03/2012  . Carpal tunnel syndrome    Bilateral, Mild  . Chronic sinusitis   . 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 Diabetes mellitus without complication ([place])    Diet controlled  . Dyspnea    When patient gets sick uses Pro-Air inhaler  . Frequent sinus infections   . GERD (gastroesophageal reflux disease)    occ  . Goiter   . History of cardiomegaly   . History of cataract    Bilateral  . History of kidney stones    08/28/2018 currently has a large kidney stone, asymptomatic at this time  . History of migraine   . History of uterine fibroid   . History of uterine prolapse   . Hyperlipidemia   . Menopause 03/03/2012  . Nasal turbinate hypertrophy 11/29/2016   Left inferior   . Personal history of chemotherapy   . Personal history of radiation therapy   . PONV (postoperative nausea and vomiting)   . Sinusitis 2014  . Sleep apnea    No CPAP  . SVD (spontaneous vaginal delivery)    x 2  . Thyroid nodule     Patient Active Problem List   Diagnosis Date Noted  . Complete  rotator cuff tear 09/03/2018  . Pelvic prolapse 05/16/2017  . 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 Malignant neoplasm of upper-outer quadrant of left breast in female, estrogen receptor negative ([place]) 03/03/2012  . 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 Menopause 03/03/2012    Past Surgical History:  Procedure Laterality Date  . BILATERAL SALPINGOOPHORECTOMY Bilateral   . BLADDER SUSPENSION N/A 05/16/2017   Procedure: TRANSVAGINAL TAPE (TVT) PROCEDURE;  Surgeon: Everett Graff, MD;  Location: [facility];  Service: Gynecology;  Laterality: N/A;  . BREAST BIOPSY Right 2009  . BREAST LUMPECTOMY Right 2009  . BREAST LUMPECTOMY Left 2006  . CATARACT EXTRACTION Left 10/23/2017  . CATARACT EXTRACTION Right 11/06/2017  . COLONOSCOPY  10/22/2009   Normal.  Repeat 5 years.  Nat [name]  . CYSTOSCOPY N/A 05/16/2017   Procedure: CYSTOSCOPY;  Surgeon: Everett Graff, MD;  Location: [facility];  Service: Gynecology;  Laterality: N/A;  . ROBOTIC ASSISTED TOTAL HYSTERECTOMY N/A 05/16/2017   Procedure: ROBOTIC ASSISTED TOTAL HYSTERECTOMY;  Surgeon: Delsa Bern, MD;  Location: [facility];  Service: Gynecology;  Laterality: N/A;  . SHOULDER ARTHROSCOPY WITH ROTATOR CUFF REPAIR AND SUBACROMIAL DECOMPRESSION Right 09/03/2018   Procedure: Right shoulder manipulation under anesthesia, exam  under anesthesia, mini open rotator cuff repair, subacromial decompression;  Surgeon: Susa Day, MD;  Location: [facility];  Service: Orthopedics;  Laterality: Right;  90 mins  . TUBAL LIGATION    . WISDOM TOOTH EXTRACTION       OB History   No obstetric history on file.     Family History  Problem Relation Age of Onset  . Cancer Mother 56       breast cancer  . Hypertension Mother   . Diabetes Father   . Cancer Sister        breast cancer  . Breast cancer Sister   . Cancer Sister        breast cancer  . Breast cancer Sister     Social History   Tobacco Use  . Smoking  status: Never Smoker  . Smokeless tobacco: Never Used  Substance Use Topics  . Alcohol use: No  . Drug use: No    Home Medications Prior to Admission medications   Medication Sig Start Date End Date Taking? Authorizing Provider  atorvastatin (LIPITOR) 10 MG tablet Take 10 mg by mouth daily.     [provider]  b complex vitamins tablet Take 1 tablet by mouth daily.    [provider]  Calcium Carbonate (CALCIUM 600 PO) Take 600 mg by mouth daily.    [provider]  cetirizine (ZYRTEC) 10 MG tablet Take 10 mg by mouth daily.    [provider]  Cholecalciferol (VITAMIN D3) 2000 units TABS Take 2,000 Units by mouth daily.    [provider]  Chromium Picolinate 1000 MCG TABS Take 1,000 mcg by mouth daily.    [provider]  GLUCOSAMINE-CHONDROITIN PO Take 1 tablet by mouth daily.    [provider]  HYDROcodone-acetaminophen (NORCO) 5-325 MG tablet Take 1-2 tablets by mouth every 6 (six) hours as needed for moderate pain. 09/03/18   Cecilie Kicks, PA-C  Multiple Vitamin (MULTIVITAMIN WITH MINERALS) TABS tablet Take 1 tablet by mouth daily.    [provider]  Polyethyl Glycol-Propyl Glycol (ULTRA LUBRICANT EYE DROPS OP) Place 1 drop into both eyes daily as needed (for dry eyes).    [provider]  PROAIR HFA 108 [phone] Base) MCG/ACT inhaler Inhale 2 puffs into the lungs every 6 (six) hours as needed for wheezing or shortness of breath.  11/05/16   [provider]  sertraline (ZOLOFT) 50 MG tablet Take 25 mg by mouth daily.     [provider]  solifenacin (VESICARE) 10 MG tablet Take 10 mg by mouth daily.     [provider]  traZODone (DESYREL) 50 MG tablet Take 25-50 mg by mouth at bedtime as needed for sleep.     [provider]    Allergies    Aspirin and Tape  Review of Systems   Review of Systems All other systems negative except as documented in the HPI. All  pertinent positives and negatives as reviewed in the HPI. Physical Exam Updated Vital Signs BP 140/64   Pulse 85   Temp 97.6 F (36.4 C) (Oral)   Resp 19   Wt 88.9 kg   SpO2 97%   BMI 30.24 kg/m   Physical Exam Vitals and nursing note reviewed.  Constitutional:      General: She is not in acute distress.    Appearance: She is well-developed.  HENT:     Head: Normocephalic and atraumatic.  Eyes:     Pupils: Pupils  are equal, round, and reactive to light.  Cardiovascular:     Rate and Rhythm: Normal rate and regular rhythm.     Heart sounds: Normal heart sounds. No murmur. No friction rub. No gallop.   Pulmonary:     Effort: Pulmonary effort is normal. No respiratory distress.     Breath sounds: Normal breath sounds. No wheezing.  Abdominal:     General: Bowel sounds are normal. There is no distension.     Palpations: Abdomen is soft.     Tenderness: There is no abdominal tenderness.  Musculoskeletal:     Cervical back: Normal range of motion and neck supple.  Skin:    General: Skin is warm and dry.     Capillary Refill: Capillary refill takes less than 2 seconds.     Findings: No erythema or rash.  Neurological:     Mental Status: She is alert and oriented to person, place, and time.     Motor: No abnormal muscle tone.     Coordination: Coordination normal.  Psychiatric:        Behavior: Behavior normal.     ED Results / Procedures / Treatments   Labs (all labs ordered are listed, but only abnormal results are displayed) Labs Reviewed  BASIC METABOLIC PANEL  CBC  CBG MONITORING, ED  TROPONIN I (HIGH SENSITIVITY)  TROPONIN I (HIGH SENSITIVITY)    EKG EKG Interpretation  Date/Time:  Tuesday January 05 2020 15:36:16 EDT Ventricular Rate:  78 PR Interval:  168 QRS Duration: 156 QT Interval:  440 QTC Calculation: 501 R Axis:   22 Text Interpretation: Normal sinus rhythm Left bundle branch block Abnormal ECG Since last tracing QRS has widened Confirmed by  Noemi Chapel [phone]) on 01/05/2020 6:47:38 PM   Radiology DG Chest 2 View  Result Date: 01/05/2020 CLINICAL DATA:  Centralized chest pain EXAM: CHEST - 2 VIEW COMPARISON:  Radiograph 03/20/1999 FINDINGS: Normal mediastinum and cardiac silhouette. Normal pulmonary vasculature. No evidence of effusion, infiltrate, or pneumothorax. No acute bony abnormality. IMPRESSION: No acute cardiopulmonary process. Electronically Signed   By: Suzy Bouchard M.D.   On: 01/05/2020 16:09    Procedures Procedures (including critical care time)  Medications Ordered in ED Medications - No data to display  ED Course  I have reviewed the triage vital signs and the nursing notes.  Pertinent labs & imaging results that were available during my care of the patient were reviewed by me and considered in my medical decision making (see chart for details).    MDM Rules/Calculators/A&P                      I feel that the patient's chest pain is atypical for cardiac disease.  She does have an appoint with a cardiologist tomorrow.  The patient's symptoms been constant for 6 days.  And one troponin was negative and I feel that that is sufficient for this patient.  The patient's other laboratory testing is not yielding significant abnormality.  I do feel that this is a stress and anxiety reaction that is causing this atypical chest pain.  She will have close follow-up with cardiology for further evaluation and care for chest pain.  I have advised the patient to return here for any worsening in her condition. Final Clinical Impression(s) / ED Diagnoses Final diagnoses:  Atypical chest pain    Rx / DC Orders ED Discharge Orders    None       Houston Zapien, Harrell Gave, PA-C  01/07/20 0020    Noemi Chapel, MD 01/07/20 2249

## 2020-01-08 ENCOUNTER — Ambulatory Visit: Payer: Self-pay | Admitting: Cardiology

## 2020-01-20 ENCOUNTER — Other Ambulatory Visit: Payer: Self-pay

## 2020-01-20 ENCOUNTER — Ambulatory Visit: Payer: Medicare Other | Admitting: Cardiology

## 2020-01-20 ENCOUNTER — Encounter: Payer: Self-pay | Admitting: Cardiology

## 2020-01-20 VITALS — BP 130/67 | HR 90 | Temp 97.4°F | Ht 67.0 in | Wt 198.0 lb

## 2020-01-20 DIAGNOSIS — E782 Mixed hyperlipidemia: Secondary | ICD-10-CM | POA: Diagnosis not present

## 2020-01-20 DIAGNOSIS — E119 Type 2 diabetes mellitus without complications: Secondary | ICD-10-CM | POA: Diagnosis not present

## 2020-01-20 DIAGNOSIS — I209 Angina pectoris, unspecified: Secondary | ICD-10-CM | POA: Diagnosis not present

## 2020-01-20 DIAGNOSIS — I447 Left bundle-branch block, unspecified: Secondary | ICD-10-CM

## 2020-01-20 DIAGNOSIS — I10 Essential (primary) hypertension: Secondary | ICD-10-CM

## 2020-01-20 MED ORDER — NITROGLYCERIN 0.4 MG SL SUBL: 0.4000 mg | SUBLINGUAL_TABLET | SUBLINGUAL | 0 refills | Status: DC | PRN

## 2020-01-20 MED ORDER — LOSARTAN POTASSIUM 50 MG PO TABS: 50.0000 mg | ORAL_TABLET | Freq: Every morning | ORAL | 1 refills | Status: DC

## 2020-01-20 NOTE — Patient Instructions (Signed)
Please remember to bring in your medication bottles in at the next visit.   New Medications that were added at today's visit:  Losartan 50 mg p.o. daily. Sublingual nitroglycerin tablets as needed.  Medications that were discontinued at today's visit: None  Office will call you to have the following tests scheduled:  Echo and stress test  Please get labs done ASAP when fasting at the nearest [place].  Recommend follow up with your PCP as scheduled.

## 2020-01-20 NOTE — Progress Notes (Signed)
Jordan Willis Date of Birth: Oct 02, 1951 MRN: [id number] Primary Care Provider:Pharr, Thayer Jew, MD  Date: 01/24/20 Last Office Visit: 07/31/2018  Chief Complaint  Patient presents with  . Chest Pain    follow up    HPI  Jordan Willis is a 69 y.o.  female who presents to the office with a chief complaint of " chest pain." Patient's past medical history and cardiovascular risk factors include: Left bundle branch block, type 2 diabetes mellitus, hyperlipidemia, postmenopausal female, advanced age.  Patient was last seen in the office on July 31, 2018 by Jordan Dunn, MSN, APRN, FNP-C.  I am seeing the patient for the first time for the above-mentioned chief complaint.  Her old records are available on all scripts and they have been extracted in two epic during this office visit.  Chest pain: Patient states that she has been having chest discomfort since January 17, 2020.  More pronounced over the last 2 weeks.  Associated with stressful situations such as recently lost her sister.  The pain is substernally located, intensity 9 out of 10, no radiation of discomfort, last for 15 to 20 minutes in duration, improved with a deep breath, not always associated with effort related activities, and is self-limiting.  Patient has not had any sublingual nitroglycerin tablet.  Went to the ER on January 05, 2020 due to similar symptoms.  Preliminary work-up was unremarkable high-sensitivity troponin negative x1.  Patient was instructed to follow-up outpatient with cardiology.  No family history of premature coronary artery disease  Denies prior history of coronary artery disease, myocardial infarction, congestive heart failure, deep venous thrombosis, pulmonary embolism, stroke, transient ischemic attack.  FUNCTIONAL STATUS: No structured exercise program or daily routine.    ALLERGIES: Allergies  Allergen Reactions  . Aspirin Palpitations and Other (See Comments)    Heart fluttering   . Tape Rash     MEDICATION LIST PRIOR TO VISIT: Current Outpatient Medications on File Prior to Visit  Medication Sig Dispense Refill  . atorvastatin (LIPITOR) 10 MG tablet Take 10 mg by mouth daily.     Marland Kitchen b complex vitamins tablet Take 1 tablet by mouth daily.    . Calcium Carbonate (CALCIUM 600 PO) Take 600 mg by mouth daily.    . cetirizine (ZYRTEC) 10 MG tablet Take 10 mg by mouth daily.    . Cholecalciferol (VITAMIN D3) 2000 units TABS Take 2,000 Units by mouth daily.    . Chromium Picolinate 1000 MCG TABS Take 1,000 mcg by mouth daily.    . collagenase (SANTYL) ointment Apply 1 application topically daily.    Marland Kitchen dexlansoprazole (DEXILANT) 60 MG capsule Take 60 mg by mouth daily.    Marland Kitchen GLUCOSAMINE-CHONDROITIN PO Take 1 tablet by mouth daily.    . metFORMIN (GLUCOPHAGE) 500 MG tablet Take by mouth daily.    . Multiple Vitamin (MULTIVITAMIN WITH MINERALS) TABS tablet Take 1 tablet by mouth daily.    . Multiple Vitamins-Minerals (MULTIVITAMIN WITH MINERALS) tablet Take 1 tablet by mouth daily.    Vladimir Faster Glycol-Propyl Glycol (ULTRA LUBRICANT EYE DROPS OP) Place 1 drop into both eyes daily as needed (for dry eyes).    Marland Kitchen PROAIR HFA 108 (90 Base) MCG/ACT inhaler Inhale 2 puffs into the lungs every 6 (six) hours as needed for wheezing or shortness of breath.     . sertraline (ZOLOFT) 50 MG tablet Take 25 mg by mouth daily.     . solifenacin (VESICARE) 10 MG tablet Take 10 mg by  mouth daily.     . traZODone (DESYREL) 50 MG tablet Take 25-50 mg by mouth at bedtime as needed for sleep.     Marland Kitchen HYDROcodone-acetaminophen (NORCO) 5-325 MG tablet Take 1-2 tablets by mouth every 6 (six) hours as needed for moderate pain. 40 tablet 0   No current facility-administered medications on file prior to visit.    PAST MEDICAL HISTORY: Past Medical History:  Diagnosis Date  . Anxiety   . Arthritis    Lumbar spine DDD.  Marland Kitchen Breast cancer, female, left 03/03/2012  . Carpal tunnel syndrome    Bilateral, Mild  . Chronic  sinusitis   . 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 Diabetes mellitus without complication ([place])    Diet controlled  . Dyspnea    When patient gets sick uses Pro-Air inhaler  . Frequent sinus infections   . GERD (gastroesophageal reflux disease)    occ  . Goiter   . History of cardiomegaly   . History of cataract    Bilateral  . History of kidney stones    08/28/2018 currently has a large kidney stone, asymptomatic at this time  . History of migraine   . History of uterine fibroid   . History of uterine prolapse   . Hyperlipidemia   . Hypertension   . Menopause 03/03/2012  . Nasal turbinate hypertrophy 11/29/2016   Left inferior   . Personal history of chemotherapy   . Personal history of radiation therapy   . PONV (postoperative nausea and vomiting)   . Sinusitis 2014  . Sleep apnea    No CPAP  . SVD (spontaneous vaginal delivery)    x 2  . Thyroid nodule     PAST SURGICAL HISTORY: Past Surgical History:  Procedure Laterality Date  . BILATERAL SALPINGOOPHORECTOMY Bilateral   . BLADDER SUSPENSION N/A 05/16/2017   Procedure: TRANSVAGINAL TAPE (TVT) PROCEDURE;  Surgeon: Everett Graff, MD;  Location: [facility];  Service: Gynecology;  Laterality: N/A;  . BREAST BIOPSY Right 2009  . BREAST LUMPECTOMY Right 2009  . BREAST LUMPECTOMY Left 2006  . CATARACT EXTRACTION Left 10/23/2017  . CATARACT EXTRACTION Right 11/06/2017  . COLONOSCOPY  10/22/2009   Normal.  Repeat 5 years.  Nat [name]  . CYSTOSCOPY N/A 05/16/2017   Procedure: CYSTOSCOPY;  Surgeon: Everett Graff, MD;  Location: [facility];  Service: Gynecology;  Laterality: N/A;  . ROBOTIC ASSISTED TOTAL HYSTERECTOMY N/A 05/16/2017   Procedure: ROBOTIC ASSISTED TOTAL HYSTERECTOMY;  Surgeon: Delsa Bern, MD;  Location: [facility];  Service: Gynecology;  Laterality: N/A;  . ROTATOR CUFF REPAIR Bilateral   . SHOULDER ARTHROSCOPY WITH ROTATOR CUFF REPAIR AND SUBACROMIAL DECOMPRESSION Right 09/03/2018   Procedure:  Right shoulder manipulation under anesthesia, exam under anesthesia, mini open rotator cuff repair, subacromial decompression;  Surgeon: Susa Day, MD;  Location: [facility];  Service: Orthopedics;  Laterality: Right;  90 mins  . TUBAL LIGATION    . WISDOM TOOTH EXTRACTION      FAMILY HISTORY: The patient's family history includes Breast cancer in her sister and sister; Cancer in her sister and sister; Cancer (age of onset: 22) in her mother; Diabetes in her father; Hypertension in her mother.   SOCIAL HISTORY:  The patient  reports that she has never smoked. She has never used smokeless tobacco. She reports that she does not drink alcohol or use drugs.  Review of Systems  Constitution: Negative for chills and fever.  HENT: Negative for ear discharge, ear pain  and nosebleeds.   Eyes: Negative for blurred vision and discharge.  Cardiovascular: Positive for chest pain. Negative for claudication, dyspnea on exertion, leg swelling, near-syncope, orthopnea, palpitations, paroxysmal nocturnal dyspnea and syncope.  Respiratory: Negative for cough and shortness of breath.   Endocrine: Negative for polydipsia, polyphagia and polyuria.  Hematologic/Lymphatic: Negative for bleeding problem.  Skin: Negative for flushing and nail changes.  Musculoskeletal: Negative for muscle cramps, muscle weakness and myalgias.  Gastrointestinal: Negative for abdominal pain, dysphagia, hematemesis, hematochezia, melena, nausea and vomiting.  Neurological: Negative for dizziness, focal weakness and light-headedness.  Psychiatric/Behavioral:       Grieving the loss of her sister.    PHYSICAL EXAM: Vitals with BMI 01/20/2020 01/05/2020 01/05/2020  Height 5\' 7"  - -  Weight 198 lbs - -  BMI 31 - -  Systolic [id number]  Diastolic 67 64 71  Pulse 90 85 76    CONSTITUTIONAL: Well-developed and well-nourished. No acute distress.  SKIN: Skin is warm and dry. No rash noted. No cyanosis. No pallor. No jaundice HEAD:  Normocephalic and atraumatic.  EYES: No scleral icterus MOUTH/THROAT: Moist oral membranes.  NECK: No JVD present. No thyromegaly noted. No carotid bruits  LYMPHATIC: No visible cervical adenopathy.  CHEST Normal respiratory effort. No intercostal retractions  LUNGS: Clear to auscultation bilaterally.  No stridor. No wheezes. No rales.  CARDIOVASCULAR: Regular rate and rhythm, positive S1-S2, no murmurs rubs or gallops appreciated. ABDOMINAL: No apparent ascites.  EXTREMITIES: No peripheral edema  HEMATOLOGIC: No significant bruising NEUROLOGIC: Oriented to person, place, and time. Nonfocal. Normal muscle tone.  PSYCHIATRIC: Normal mood and affect. Normal behavior. Cooperative  CARDIAC DATABASE: EKG: 01/20/2020: Normal sinus rhythm with a ventricular rate of 78 bpm, normal axis deviation, left bundle branch block, poor R wave progression, ST-T changes most likely secondary to LBBB, cannot entirely rule out anteroseptal infarct.  Echocardiogram: 02/13/2017 at [facility]: EF 60-65%, no regional wall motion abnormalities, mild MR.  Stress Testing:  02/14/2018 exercise nuclear stress test at [facility]: Achieved 7 METS, normal blood pressure response to exercise, had exertional chest pain that improved/resolved during recovery, normal myocardial perfusion study no EKG abnormalities per report.  Heart Catheterization: None  LABORATORY DATA: CBC Latest Ref Rng & Units 01/05/2020 09/04/2018 08/28/2018  WBC 4.0 - 10.5 K/uL 5.8 12.0(H) 5.1  Hemoglobin 12.0 - 15.0 g/dL 12.5 12.9 12.8  Hematocrit 36.0 - 46.0 % 39.4 41.3 40.8  Platelets 150 - 400 K/uL 272 259 257    CMP Latest Ref Rng & Units 01/05/2020 09/04/2018 08/28/2018  Glucose 70 - 99 mg/dL 96 149(H) 89  BUN 8 - 23 mg/dL 9 16 11   Creatinine 0.44 - 1.00 mg/dL 0.57 0.74 0.83  Sodium 135 - 145 mmol/L 139 136 139  Potassium 3.5 - 5.1 mmol/L 3.5 4.3 3.8  Chloride 98 - 111 mmol/L 103 103 104  CO2 22 - 32 mmol/L 26 22  28   Calcium 8.9 - 10.3 mg/dL 9.5 9.3 9.3  Total Protein 6.4 - 8.3 g/dL - - -  Total Bilirubin 0.20 - 1.20 mg/dL - - -  Alkaline Phos 40 - 150 U/L - - -  AST 5 - 34 U/L - - -  ALT 0 - 55 U/L - - -    Lipid Panel: Outside records 06/24/2018: Total cholesterol 176, triglycerides 104, HDL 52, LDL 103  Lab Results  Component Value Date   HGBA1C 6.1 (H) 08/28/2018   HGBA1C 6.8 (H) 10/11/2013   No components found  for: NTPROBNP Lab Results  Component Value Date   TSH 0.599 10/11/2013   TSH 0.401 07/02/2012    Cardiac Panel (last 3 results) No results for input(s): CKTOTAL, CKMB, TROPONINIHS, RELINDX in the last 72 hours.  FINAL MEDICATION LIST END OF ENCOUNTER: Meds ordered this encounter  Medications  . losartan (COZAAR) 50 MG tablet    Sig: Take 1 tablet (50 mg total) by mouth every morning.    Dispense:  90 tablet    Refill:  1  . nitroGLYCERIN (NITROSTAT) 0.4 MG SL tablet    Sig: Place 1 tablet (0.4 mg total) under the tongue every 5 (five) minutes as needed for chest pain. If you require more than two tablets five minutes apart go to the nearest ER via EMS.    Dispense:  90 tablet    Refill:  0    There are no discontinued medications.   Current Outpatient Medications:  .  atorvastatin (LIPITOR) 10 MG tablet, Take 10 mg by mouth daily. , Disp: , Rfl:  .  b complex vitamins tablet, Take 1 tablet by mouth daily., Disp: , Rfl:  .  Calcium Carbonate (CALCIUM 600 PO), Take 600 mg by mouth daily., Disp: , Rfl:  .  cetirizine (ZYRTEC) 10 MG tablet, Take 10 mg by mouth daily., Disp: , Rfl:  .  Cholecalciferol (VITAMIN D3) 2000 units TABS, Take 2,000 Units by mouth daily., Disp: , Rfl:  .  Chromium Picolinate 1000 MCG TABS, Take 1,000 mcg by mouth daily., Disp: , Rfl:  .  collagenase (SANTYL) ointment, Apply 1 application topically daily., Disp: , Rfl:  .  dexlansoprazole (DEXILANT) 60 MG capsule, Take 60 mg by mouth daily., Disp: , Rfl:  .  GLUCOSAMINE-CHONDROITIN PO, Take 1  tablet by mouth daily., Disp: , Rfl:  .  metFORMIN (GLUCOPHAGE) 500 MG tablet, Take by mouth daily., Disp: , Rfl:  .  Multiple Vitamin (MULTIVITAMIN WITH MINERALS) TABS tablet, Take 1 tablet by mouth daily., Disp: , Rfl:  .  Multiple Vitamins-Minerals (MULTIVITAMIN WITH MINERALS) tablet, Take 1 tablet by mouth daily., Disp: , Rfl:  .  Polyethyl Glycol-Propyl Glycol (ULTRA LUBRICANT EYE DROPS OP), Place 1 drop into both eyes daily as needed (for dry eyes)., Disp: , Rfl:  .  PROAIR HFA 108 (90 Base) MCG/ACT inhaler, Inhale 2 puffs into the lungs every 6 (six) hours as needed for wheezing or shortness of breath. , Disp: , Rfl:  .  sertraline (ZOLOFT) 50 MG tablet, Take 25 mg by mouth daily. , Disp: , Rfl:  .  solifenacin (VESICARE) 10 MG tablet, Take 10 mg by mouth daily. , Disp: , Rfl:  .  traZODone (DESYREL) 50 MG tablet, Take 25-50 mg by mouth at bedtime as needed for sleep. , Disp: , Rfl:  .  HYDROcodone-acetaminophen (NORCO) 5-325 MG tablet, Take 1-2 tablets by mouth every 6 (six) hours as needed for moderate pain., Disp: 40 tablet, Rfl: 0 .  losartan (COZAAR) 50 MG tablet, Take 1 tablet (50 mg total) by mouth every morning., Disp: 90 tablet, Rfl: 1 .  nitroGLYCERIN (NITROSTAT) 0.4 MG SL tablet, Place 1 tablet (0.4 mg total) under the tongue every 5 (five) minutes as needed for chest pain. If you require more than two tablets five minutes apart go to the nearest ER via EMS., Disp: 90 tablet, Rfl: 0  IMPRESSION:    ICD-10-CM   1. Angina pectoris (HCC)  I20.9 nitroGLYCERIN (NITROSTAT) 0.4 MG SL tablet    Lipid Panel With  LDL/HDL Ratio    Basic metabolic panel    Magnesium    Pro b natriuretic peptide (BNP)[facility])    PCV ECHOCARDIOGRAM COMPLETE    PCV MYOCARDIAL PERFUSION WITH LEXISCAN  2. Essential hypertension  I10 EKG 12-Lead    losartan (COZAAR) 50 MG tablet  3. LBBB (left bundle branch block)  I44.7 PCV MYOCARDIAL PERFUSION WITH LEXISCAN  4. Non-insulin  dependent type 2 diabetes mellitus ([place])  E11.9   5. Mixed hyperlipidemia  E78.2      RECOMMENDATIONS: ARANDA DIBBLE is a 69 y.o. female whose past medical history and cardiovascular risk factors include: Left bundle branch block, type 2 diabetes mellitus, hyperlipidemia, postmenopausal female, advanced age.  Angina pectoris:  Patient symptoms of chest discomfort are both typical and atypical features.  Given her underlying non-insulin-dependent diabetes mellitus type 2 and left bundle branch block would recommend an ischemic evaluation to rule out reversible ischemia.  Echocardiogram will be ordered to evaluate for structural heart disease and left ventricular systolic function.  Nuclear stress test recommended to evaluate for reversible ischemia.  Restart losartan.  Sublingual nitroglycerin tablets as needed.  Check fasting lipid profile  Check BMP, magnesium, BNP  EKG reviewed findings noted above.  Benign essential hypertension: . Medication reconciled.  . If the blood pressure is consistently greater than 188mmHg patient is asked to call the office or her primary care provider for medication titration sooner than the next office visit.  . Low salt diet recommended. A diet that is rich in fruits, vegetables, legumes, and low-fat dairy products and low in snacks, sweets, and meats (such as the Dietary Approaches to Stop Hypertension [DASH] diet).   Mixed hyperlipidemia: . Continue statin therapy.   . Check fasting lipid profile . Currently managed by primary care provider. . Patient denies myalgia or other side effects.  Non-insulin-dependent diabetes mellitus type 2: Currently managed by PCP  In the interim, after symptoms increase in intensity, frequency, duration or has typical chest pain as discussed in the office patient is asked to seek medical attention at the closest ER via EMS.  Patient verbalized understanding and provides verbal feedback.  Orders Placed This  Encounter  Procedures  . Lipid Panel With LDL/HDL Ratio  . Basic metabolic panel  . Magnesium  . Pro b natriuretic peptide (BNP)[facility])  . PCV MYOCARDIAL PERFUSION WITH LEXISCAN  . EKG 12-Lead  . PCV ECHOCARDIOGRAM COMPLETE   --Continue cardiac medications as reconciled in final medication list. --Return in about 4 weeks (around 02/17/2020) for Review test results and symptoms. Or sooner if needed. --Continue follow-up with your primary care physician regarding the management of your other chronic comorbid conditions.  Patient's questions and concerns were addressed to her satisfaction. She voices understanding of the instructions provided during this encounter.   This note was created using a voice recognition software as a result there may be grammatical errors inadvertently enclosed that do not reflect the nature of this encounter. Every attempt is made to correct such errors.  Rex Kras, [place], [facility]  Pager: [phone] Office: [phone]

## 2020-01-24 ENCOUNTER — Encounter: Payer: Self-pay | Admitting: Cardiology

## 2020-01-27 ENCOUNTER — Other Ambulatory Visit: Payer: Medicare Other

## 2020-01-27 ENCOUNTER — Other Ambulatory Visit: Payer: Self-pay | Admitting: Cardiology

## 2020-01-27 DIAGNOSIS — I447 Left bundle-branch block, unspecified: Secondary | ICD-10-CM | POA: Diagnosis not present

## 2020-01-27 DIAGNOSIS — I209 Angina pectoris, unspecified: Secondary | ICD-10-CM | POA: Diagnosis not present

## 2020-01-28 LAB — LIPID PANEL WITH LDL/HDL RATIO
Cholesterol, Total: 175 mg/dL (ref 100–199)
HDL: 44 mg/dL (ref >39)
LDL Chol Calc (NIH): 117 mg/dL — ABNORMAL HIGH (ref 0–99)
LDL/HDL Ratio: 2.7 ratio (ref 0.0–3.2)
Triglycerides: 74 mg/dL (ref 0–149)
VLDL Cholesterol Cal: 14 mg/dL (ref 5–40)

## 2020-01-28 LAB — BASIC METABOLIC PANEL
BUN/Creatinine Ratio: 22 (ref 12–28)
BUN: 14 mg/dL (ref 8–27)
CO2: 22 mmol/L (ref 20–29)
Calcium: 10 mg/dL (ref 8.7–10.3)
Chloride: 104 mmol/L (ref 96–106)
Creatinine, Ser: 0.64 mg/dL (ref 0.57–1.00)
GFR calc Af Amer: 106 mL/min/{1.73_m2} (ref >59)
GFR calc non Af Amer: 92 mL/min/{1.73_m2} (ref >59)
Glucose: 122 mg/dL — ABNORMAL HIGH (ref 65–99)
Potassium: 4.5 mmol/L (ref 3.5–5.2)
Sodium: 143 mmol/L (ref 134–144)

## 2020-01-28 LAB — PRO B NATRIURETIC PEPTIDE: NT-Pro BNP: 332 pg/mL — ABNORMAL HIGH (ref 0–301)

## 2020-01-28 LAB — MAGNESIUM: Magnesium: 2.3 mg/dL (ref 1.6–2.3)

## 2020-02-01 ENCOUNTER — Ambulatory Visit: Payer: Medicare Other

## 2020-02-01 ENCOUNTER — Other Ambulatory Visit: Payer: Self-pay

## 2020-02-01 DIAGNOSIS — I447 Left bundle-branch block, unspecified: Secondary | ICD-10-CM

## 2020-02-01 DIAGNOSIS — I209 Angina pectoris, unspecified: Secondary | ICD-10-CM | POA: Diagnosis not present

## 2020-02-03 ENCOUNTER — Telehealth: Payer: Self-pay | Admitting: Cardiology

## 2020-02-03 ENCOUNTER — Telehealth: Payer: Self-pay

## 2020-02-03 NOTE — Telephone Encounter (Signed)
-----   Message from [place], [place] sent at 02/03/2020  1:43 PM EDT ----- Please call the patient today and have her schedule follow-up appointment to review her echo and stress test tomorrow.  Her stress test and echo are abnormal.  If she has symptoms of chest pain or worsening shortness of breath that she needs to go to the closest ER via EMS.  I will call her later today.

## 2020-02-03 NOTE — Telephone Encounter (Signed)
Spoke to the patient over the phone regarding her abnormal nuclear stress test. She is currently not having any active chest pain or heart failure symptoms. She is walking 15 to 30 minutes a day. Patient is informed to not over exert herself and if she has symptoms of chest pain and/or shortness of breath she needs to go to the closest hospital via EMS. I will have the front desk make an appointment to see me tomorrow.   Patient prefers to be seen at or after 1 PM.  Rex Kras, DO, [facility]  Pager: [phone] Office: [phone]

## 2020-02-03 NOTE — Telephone Encounter (Signed)
Left vm to c/b to schedule

## 2020-02-04 ENCOUNTER — Telehealth: Payer: Self-pay

## 2020-02-04 ENCOUNTER — Encounter: Payer: Self-pay | Admitting: Cardiology

## 2020-02-04 ENCOUNTER — Other Ambulatory Visit: Payer: Self-pay

## 2020-02-04 ENCOUNTER — Ambulatory Visit: Payer: Medicare Other | Admitting: Cardiology

## 2020-02-04 VITALS — BP 145/67 | HR 76 | Temp 96.4°F | Ht 67.0 in | Wt 190.7 lb

## 2020-02-04 DIAGNOSIS — I10 Essential (primary) hypertension: Secondary | ICD-10-CM | POA: Diagnosis not present

## 2020-02-04 DIAGNOSIS — I209 Angina pectoris, unspecified: Secondary | ICD-10-CM | POA: Diagnosis not present

## 2020-02-04 DIAGNOSIS — E119 Type 2 diabetes mellitus without complications: Secondary | ICD-10-CM | POA: Diagnosis not present

## 2020-02-04 DIAGNOSIS — R9439 Abnormal result of other cardiovascular function study: Secondary | ICD-10-CM | POA: Diagnosis not present

## 2020-02-04 DIAGNOSIS — I447 Left bundle-branch block, unspecified: Secondary | ICD-10-CM

## 2020-02-04 DIAGNOSIS — E782 Mixed hyperlipidemia: Secondary | ICD-10-CM

## 2020-02-04 NOTE — Telephone Encounter (Signed)
Spoke with patient in regards to starting Aspirin 81. Patient voiced understanding and stated she would begin this regime.

## 2020-02-04 NOTE — Telephone Encounter (Signed)
Spoke with patient she will be here for 1:15 pm appointment.

## 2020-02-04 NOTE — Progress Notes (Signed)
Jordan Willis Date of Birth: 10-Jun-1951 MRN: [id number] Primary Care Provider:Pharr, Thayer Jew, MD  Former Cardiology Providers: Miquel Dunn, MSN, APRN, FNP-C Primary Cardiologist: Rex Kras, DO  Date: 02/04/20 Last Office Visit: 01/20/2020  Chief Complaint  Patient presents with  . Chest Pain  . Follow-up    4 week, test results  . Results    HPI  Jordan Willis is a 69 y.o.  female who presents to the office with a chief complaint of " chest pain." Patient's past medical history and cardiovascular risk factors include: Left bundle branch block, type 2 diabetes mellitus, hyperlipidemia, postmenopausal female, advanced age.   Patient establish care with myself back in March 2021 when she came for evaluation of chest pain.  At that time patient stated that she was having chest pain that was predominantly more pronounced for 2 weeks prior.  The pain was substernally located, intensity 9 out of 10, nonradiating, last for 15 to 20 minutes, improved with deep breath and self-limited.  Not always associated with effort related activities.  She had gone to the ER on January 05, 2020 due to's similar symptoms and work-up was noted to be negative and was discharged.  She followed up at the office and was recommended to undergo an echo and stress test.  Patient was brought in sooner given the abnormal nuclear stress test.  Patient is accompanied by her sister Jordan Willis at today's office visit.  Since last office visit patient states that her chest pain is much better follow-up visit.  One episode of chest discomfort while walking up the hill last week with her son.  The pain was substernally located, improved with resting and decreasing her pace, did not use sublingual nitroglycerin tablets.  She was started on losartan at the last office visit for her hypertension and her underlying diabetes mellitus type 2 but she was unable to fill this.  She states that her blood pressures at home are very well  controlled.  But I do not have a blood pressure log to review.  Nuclear stress test results were reviewed with the patient in great detail including the report, images, and illustrated findings in layman terms.  Their questions and concerns were addressed to their satisfaction and findings are noted below for further reference.  No family history of premature coronary artery disease  Denies prior history of coronary artery disease, myocardial infarction, congestive heart failure, deep venous thrombosis, pulmonary embolism, stroke, transient ischemic attack.  FUNCTIONAL STATUS: No structured exercise program or daily routine.    ALLERGIES: Allergies  Allergen Reactions  . Aspirin Palpitations and Other (See Comments)    Heart fluttering   . Tape Rash   MEDICATION LIST PRIOR TO VISIT: Current Outpatient Medications on File Prior to Visit  Medication Sig Dispense Refill  . aspirin 81 MG EC tablet Take 81 mg by mouth daily. Swallow whole.    Marland Kitchen atorvastatin (LIPITOR) 10 MG tablet Take 10 mg by mouth daily.     Marland Kitchen b complex vitamins tablet Take 1 tablet by mouth daily.    . Calcium Carbonate (CALCIUM 600 PO) Take 600 mg by mouth daily.    . cetirizine (ZYRTEC) 10 MG tablet Take 10 mg by mouth daily.    . Cholecalciferol (VITAMIN D3) 2000 units TABS Take 2,000 Units by mouth daily.    Marland Kitchen dexlansoprazole (DEXILANT) 60 MG capsule Take 60 mg by mouth daily.    Marland Kitchen GLUCOSAMINE-CHONDROITIN PO Take 1 tablet by mouth daily.    Marland Kitchen  metFORMIN (GLUCOPHAGE) 500 MG tablet Take by mouth daily.    . Multiple Vitamin (MULTIVITAMIN WITH MINERALS) TABS tablet Take 1 tablet by mouth daily.    . nitroGLYCERIN (NITROSTAT) 0.4 MG SL tablet Place 1 tablet (0.4 mg total) under the tongue every 5 (five) minutes as needed for chest pain. If you require more than two tablets five minutes apart go to the nearest ER via EMS. 90 tablet 0  . Polyethyl Glycol-Propyl Glycol (ULTRA LUBRICANT EYE DROPS OP) Place 1 drop into both  eyes daily as needed (for dry eyes).    Marland Kitchen PROAIR HFA 108 (90 Base) MCG/ACT inhaler Inhale 2 puffs into the lungs every 6 (six) hours as needed for wheezing or shortness of breath.     . sertraline (ZOLOFT) 50 MG tablet Take 50 mg by mouth daily.     . solifenacin (VESICARE) 10 MG tablet Take 10 mg by mouth daily.     . traZODone (DESYREL) 50 MG tablet Take 25-50 mg by mouth at bedtime as needed for sleep.     Marland Kitchen losartan (COZAAR) 50 MG tablet Take 1 tablet (50 mg total) by mouth every morning. (Patient not taking: Reported on 02/04/2020) 90 tablet 1   No current facility-administered medications on file prior to visit.    PAST MEDICAL HISTORY: Past Medical History:  Diagnosis Date  . Anxiety   . Arthritis    Lumbar spine DDD.  Marland Kitchen Breast cancer, female, left 03/03/2012  . Carpal tunnel syndrome    Bilateral, Mild  . Chronic sinusitis   . 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 Diabetes mellitus without complication ([place])    Diet controlled  . Dyspnea    When patient gets sick uses Pro-Air inhaler  . Frequent sinus infections   . GERD (gastroesophageal reflux disease)    occ  . Goiter   . History of cardiomegaly   . History of cataract    Bilateral  . History of kidney stones    08/28/2018 currently has a large kidney stone, asymptomatic at this time  . History of migraine   . History of uterine fibroid   . History of uterine prolapse   . Hyperlipidemia   . Hypertension   . Menopause 03/03/2012  . Nasal turbinate hypertrophy 11/29/2016   Left inferior   . Personal history of chemotherapy   . Personal history of radiation therapy   . PONV (postoperative nausea and vomiting)   . Sinusitis 2014  . Sleep apnea    No CPAP  . SVD (spontaneous vaginal delivery)    x 2  . Thyroid nodule     PAST SURGICAL HISTORY: Past Surgical History:  Procedure Laterality Date  . BILATERAL SALPINGOOPHORECTOMY Bilateral   . BLADDER SUSPENSION N/A 05/16/2017   Procedure:  TRANSVAGINAL TAPE (TVT) PROCEDURE;  Surgeon: Everett Graff, MD;  Location: [facility];  Service: Gynecology;  Laterality: N/A;  . BREAST BIOPSY Right 2009  . BREAST LUMPECTOMY Right 2009  . BREAST LUMPECTOMY Left 2006  . CATARACT EXTRACTION Left 10/23/2017  . CATARACT EXTRACTION Right 11/06/2017  . COLONOSCOPY  10/22/2009   Normal.  Repeat 5 years.  Nat [name]  . CYSTOSCOPY N/A 05/16/2017   Procedure: CYSTOSCOPY;  Surgeon: Everett Graff, MD;  Location: [facility];  Service: Gynecology;  Laterality: N/A;  . ROBOTIC ASSISTED TOTAL HYSTERECTOMY N/A 05/16/2017   Procedure: ROBOTIC ASSISTED TOTAL HYSTERECTOMY;  Surgeon: Delsa Bern, MD;  Location: [facility];  Service: Gynecology;  Laterality: N/A;  .  ROTATOR CUFF REPAIR Bilateral   . SHOULDER ARTHROSCOPY WITH ROTATOR CUFF REPAIR AND SUBACROMIAL DECOMPRESSION Right 09/03/2018   Procedure: Right shoulder manipulation under anesthesia, exam under anesthesia, mini open rotator cuff repair, subacromial decompression;  Surgeon: Susa Day, MD;  Location: [facility];  Service: Orthopedics;  Laterality: Right;  90 mins  . TUBAL LIGATION    . WISDOM TOOTH EXTRACTION      FAMILY HISTORY: The patient's family history includes Breast cancer in her sister and sister; Cancer in her sister and sister; Cancer (age of onset: 3) in her mother; Diabetes in her father; Hypertension in her mother.   SOCIAL HISTORY:  The patient  reports that she has never smoked. She has never used smokeless tobacco. She reports that she does not drink alcohol or use drugs.  Review of Systems  Constitution: Negative for chills and fever.  HENT: Negative for ear discharge, ear pain and nosebleeds.   Eyes: Negative for blurred vision and discharge.  Cardiovascular: Positive for chest pain. Negative for claudication, dyspnea on exertion, leg swelling, near-syncope, orthopnea, palpitations, paroxysmal nocturnal dyspnea and syncope.  Respiratory: Negative for cough and shortness of breath.     Endocrine: Negative for polydipsia, polyphagia and polyuria.  Hematologic/Lymphatic: Negative for bleeding problem.  Skin: Negative for flushing and nail changes.  Musculoskeletal: Negative for muscle cramps, muscle weakness and myalgias.  Gastrointestinal: Negative for abdominal pain, dysphagia, hematemesis, hematochezia, melena, nausea and vomiting.  Neurological: Negative for dizziness, focal weakness and light-headedness.  Psychiatric/Behavioral:       Grieving the loss of her sister.    PHYSICAL EXAM: Vitals with BMI 02/04/2020 01/20/2020 01/05/2020  Height 5\' 7"  5\' 7"  -  Weight 190 lbs 11 oz 198 lbs -  BMI [id number] 31 -  Systolic [id number]  Diastolic 67 67 64  Pulse 76 90 85    CONSTITUTIONAL: Well-developed and well-nourished. No acute distress.  SKIN: Skin is warm and dry. No rash noted. No cyanosis. No pallor. No jaundice HEAD: Normocephalic and atraumatic.  EYES: No scleral icterus MOUTH/THROAT: Moist oral membranes.  NECK: No JVD present. No thyromegaly noted. No carotid bruits  LYMPHATIC: No visible cervical adenopathy.  CHEST Normal respiratory effort. No intercostal retractions  LUNGS: Clear to auscultation bilaterally.  No stridor. No wheezes. No rales.  CARDIOVASCULAR: Regular rate and rhythm, positive S1-S2, no murmurs rubs or gallops appreciated. ABDOMINAL: No apparent ascites.  EXTREMITIES: No peripheral edema  HEMATOLOGIC: No significant bruising NEUROLOGIC: Oriented to person, place, and time. Nonfocal. Normal muscle tone.  PSYCHIATRIC: Normal mood and affect. Normal behavior. Cooperative  CARDIAC DATABASE: EKG: 01/20/2020: Normal sinus rhythm with a ventricular rate of 78 bpm, normal axis deviation, left bundle branch block, poor R wave progression, ST-T changes most likely secondary to LBBB, cannot entirely rule out anteroseptal infarct.  Echocardiogram: 02/13/2017 at [facility]: EF 60-65%, no regional wall motion abnormalities, mild  MR.  Stress Testing:  02/14/2018 exercise nuclear stress test [facility] Achieved 7 METS, normal blood pressure response to exercise, had exertional chest pain that improved/resolved during recovery, normal myocardial perfusion study no EKG abnormalities per report.  Heart Catheterization: None  LABORATORY DATA: CBC Latest Ref Rng & Units 01/05/2020 09/04/2018 08/28/2018  WBC 4.0 - 10.5 K/uL 5.8 12.0(H) 5.1  Hemoglobin 12.0 - 15.0 g/dL 12.5 12.9 12.8  Hematocrit 36.0 - 46.0 % 39.4 41.3 40.8  Platelets 150 - 400 K/uL 272 259 257    CMP Latest Ref Rng & Units 01/27/2020 01/05/2020 09/04/2018  Glucose 65 - 99 mg/dL 122(H) 96 149(H)  BUN 8 - 27 mg/dL 14 9 16   Creatinine 0.57 - 1.00 mg/dL 0.64 0.57 0.74  Sodium 134 - 144 mmol/L 143 139 136  Potassium 3.5 - 5.2 mmol/L 4.5 3.5 4.3  Chloride 96 - 106 mmol/L 104 103 103  CO2 20 - 29 mmol/L 22 26 22   Calcium 8.7 - 10.3 mg/dL 10.0 9.5 9.3  Total Protein 6.4 - 8.3 g/dL - - -  Total Bilirubin 0.20 - 1.20 mg/dL - - -  Alkaline Phos 40 - 150 U/L - - -  AST 5 - 34 U/L - - -  ALT 0 - 55 U/L - - -    Lipid Panel: Outside records 06/24/2018: Total cholesterol 176, triglycerides 104, HDL 52, LDL 103  Lab Results  Component Value Date   HGBA1C 6.1 (H) 08/28/2018   HGBA1C 6.8 (H) 10/11/2013   No components found for: NTPROBNP Lab Results  Component Value Date   TSH 0.599 10/11/2013   TSH 0.401 07/02/2012    Cardiac Panel (last 3 results) No results for input(s): CKTOTAL, CKMB, TROPONINIHS, RELINDX in the last 72 hours.  FINAL MEDICATION LIST END OF ENCOUNTER: No orders of the defined types were placed in this encounter.   Medications Discontinued During This Encounter  Medication Reason  . Chromium Picolinate 1000 MCG TABS Patient Preference  . Multiple Vitamins-Minerals (MULTIVITAMIN WITH MINERALS) tablet Patient Preference     Current Outpatient Medications:  .  aspirin 81 MG EC tablet, Take 81 mg by mouth daily.  Swallow whole., Disp: , Rfl:  .  atorvastatin (LIPITOR) 10 MG tablet, Take 10 mg by mouth daily. , Disp: , Rfl:  .  b complex vitamins tablet, Take 1 tablet by mouth daily., Disp: , Rfl:  .  Calcium Carbonate (CALCIUM 600 PO), Take 600 mg by mouth daily., Disp: , Rfl:  .  cetirizine (ZYRTEC) 10 MG tablet, Take 10 mg by mouth daily., Disp: , Rfl:  .  Cholecalciferol (VITAMIN D3) 2000 units TABS, Take 2,000 Units by mouth daily., Disp: , Rfl:  .  dexlansoprazole (DEXILANT) 60 MG capsule, Take 60 mg by mouth daily., Disp: , Rfl:  .  GLUCOSAMINE-CHONDROITIN PO, Take 1 tablet by mouth daily., Disp: , Rfl:  .  metFORMIN (GLUCOPHAGE) 500 MG tablet, Take by mouth daily., Disp: , Rfl:  .  Multiple Vitamin (MULTIVITAMIN WITH MINERALS) TABS tablet, Take 1 tablet by mouth daily., Disp: , Rfl:  .  nitroGLYCERIN (NITROSTAT) 0.4 MG SL tablet, Place 1 tablet (0.4 mg total) under the tongue every 5 (five) minutes as needed for chest pain. If you require more than two tablets five minutes apart go to the nearest ER via EMS., Disp: 90 tablet, Rfl: 0 .  Polyethyl Glycol-Propyl Glycol (ULTRA LUBRICANT EYE DROPS OP), Place 1 drop into both eyes daily as needed (for dry eyes)., Disp: , Rfl:  .  PROAIR HFA 108 (90 Base) MCG/ACT inhaler, Inhale 2 puffs into the lungs every 6 (six) hours as needed for wheezing or shortness of breath. , Disp: , Rfl:  .  sertraline (ZOLOFT) 50 MG tablet, Take 50 mg by mouth daily. , Disp: , Rfl:  .  solifenacin (VESICARE) 10 MG tablet, Take 10 mg by mouth daily. , Disp: , Rfl:  .  traZODone (DESYREL) 50 MG tablet, Take 25-50 mg by mouth at bedtime as needed for sleep. , Disp: , Rfl:  .  losartan (COZAAR) 50 MG tablet, Take 1 tablet (  50 mg total) by mouth every morning. (Patient not taking: Reported on 02/04/2020), Disp: 90 tablet, Rfl: 1  IMPRESSION:    ICD-10-CM   1. Abnormal nuclear stress test  R94.39   2. Essential hypertension  I10   3. Angina pectoris (HCC)  I20.9   4. LBBB (left  bundle branch block)  I44.7   5. Non-insulin dependent type 2 diabetes mellitus ([place])  E11.9   6. Mixed hyperlipidemia  E78.2      RECOMMENDATIONS: Jordan Willis is a 69 y.o. female whose past medical history and cardiovascular risk factors include: Left bundle branch block, type 2 diabetes mellitus, hyperlipidemia, postmenopausal female, advanced age.  Abnormal nuclear stress test:  Patient symptoms of chest discomfort are very suggestive of angina pectoris and given the abnormal nuclear stress test recommended left heart catheterization.  Went over the risks, benefits, and alternatives to left heart catheterization with possible intervention with both the patient and her sister at today's office visit.  Alternatives discussed included cardiac CTA with FFR.  After discussing his strengths and benefits of each modality patient would like to proceed with angiography.  The left heart catheterization procedure was explained to the patient and her sister in detail. The indication, alternatives, risks and benefits were reviewed. Complications including but not limited to bleeding, infection, acute kidney injury, blood transfusion, heart rhythm disturbances, contrast (dye) reaction, damage to the arteries or nerves in the legs or hands, cerebrovascular accident, myocardial infarction, need for emergent bypass surgery, blood clots in the legs, possible need for emergent blood transfusion, and rarely death were reviewed and discussed with the patient. The patient voices understanding and wishes to proceed.   Angina pectoris:  Continuestatin therapy.  I have asked her to start aspirin 81 mg p.o. daily.  It is listed as one of her allergies which causes palpitations.  It was explained to her that if she does need coronary intervention she will need to be on dual antiplatelet therapy and aspirin being one of them.  Recommended restarting losartan for renal protection.  Sublingual nitroglycerin tablets as  needed.  Discussed with the patient that if she has similar anginal chest pain she needs to go to the ER via EMS.    Benign essential hypertension: . Medication reconciled.  . If the blood pressure is consistently greater than 131mmHg patient is asked to call the office or her primary care provider for medication titration sooner than the next office visit.  . Low salt diet recommended. A diet that is rich in fruits, vegetables, legumes, and low-fat dairy products and low in snacks, sweets, and meats (such as the Dietary Approaches to Stop Hypertension [DASH] diet).   Mixed hyperlipidemia: . Continue statin therapy.   . We will rediscuss the lipid profile the next office visit. . Currently managed by primary care provider. . Patient denies myalgia or other side effects.  Non-insulin-dependent diabetes mellitus type 2: Currently managed by PCP.  No recent hemoglobin A1c for review.  --Continue cardiac medications as reconciled in final medication list. --Return for post-cath . Or sooner if needed. --Continue follow-up with your primary care physician regarding the management of your other chronic comorbid conditions.  Patient's questions and concerns were addressed to her satisfaction. She voices understanding of the instructions provided during this encounter.   This note was created using a voice recognition software as a result there may be grammatical errors inadvertently enclosed that do not reflect the nature of this encounter. Every attempt is made to correct  such errors.  Rex Kras, [place], [facility]  Pager: [phone] Office: [phone]

## 2020-02-04 NOTE — Telephone Encounter (Signed)
Patient on schedule today 4/15.

## 2020-02-05 ENCOUNTER — Other Ambulatory Visit (HOSPITAL_COMMUNITY)
Admission: RE | Admit: 2020-02-05 | Discharge: 2020-02-05 | Disposition: A | Discharge status: Home / Self Care | Payer: Medicare Other | Source: Ambulatory Visit | Attending: Cardiology | Admitting: Cardiology

## 2020-02-05 DIAGNOSIS — Z20822 Contact with and (suspected) exposure to covid-19: Secondary | ICD-10-CM | POA: Insufficient documentation

## 2020-02-05 DIAGNOSIS — Z01812 Encounter for preprocedural laboratory examination: Secondary | ICD-10-CM | POA: Diagnosis not present

## 2020-02-05 LAB — SARS CORONAVIRUS 2 (TAT 6-24 HRS): SARS Coronavirus 2: NEGATIVE

## 2020-02-09 ENCOUNTER — Encounter (HOSPITAL_COMMUNITY)
Admission: RE | Disposition: A | Discharge status: Home / Self Care | Payer: Self-pay | Source: Home / Self Care | Attending: Cardiology

## 2020-02-09 ENCOUNTER — Ambulatory Visit (HOSPITAL_COMMUNITY): Payer: Medicare Other

## 2020-02-09 ENCOUNTER — Telehealth: Payer: Self-pay

## 2020-02-09 ENCOUNTER — Other Ambulatory Visit: Payer: Self-pay

## 2020-02-09 ENCOUNTER — Ambulatory Visit (HOSPITAL_COMMUNITY)
Admission: RE | Admit: 2020-02-09 | Discharge: 2020-02-09 | Disposition: A | Discharge status: Home / Self Care | Payer: Medicare Other | Attending: Cardiology | Admitting: Cardiology

## 2020-02-09 DIAGNOSIS — F419 Anxiety disorder, unspecified: Secondary | ICD-10-CM | POA: Diagnosis not present

## 2020-02-09 DIAGNOSIS — R079 Chest pain, unspecified: Secondary | ICD-10-CM | POA: Diagnosis not present

## 2020-02-09 DIAGNOSIS — G4730 Sleep apnea, unspecified: Secondary | ICD-10-CM | POA: Diagnosis not present

## 2020-02-09 DIAGNOSIS — Z853 Personal history of malignant neoplasm of breast: Secondary | ICD-10-CM | POA: Diagnosis not present

## 2020-02-09 DIAGNOSIS — I10 Essential (primary) hypertension: Secondary | ICD-10-CM | POA: Diagnosis not present

## 2020-02-09 DIAGNOSIS — Z79899 Other long term (current) drug therapy: Secondary | ICD-10-CM | POA: Insufficient documentation

## 2020-02-09 DIAGNOSIS — Z7982 Long term (current) use of aspirin: Secondary | ICD-10-CM | POA: Insufficient documentation

## 2020-02-09 DIAGNOSIS — Z9221 Personal history of antineoplastic chemotherapy: Secondary | ICD-10-CM | POA: Insufficient documentation

## 2020-02-09 DIAGNOSIS — Z888 Allergy status to other drugs, medicaments and biological substances status: Secondary | ICD-10-CM | POA: Diagnosis not present

## 2020-02-09 DIAGNOSIS — Z7984 Long term (current) use of oral hypoglycemic drugs: Secondary | ICD-10-CM | POA: Diagnosis not present

## 2020-02-09 DIAGNOSIS — E119 Type 2 diabetes mellitus without complications: Secondary | ICD-10-CM | POA: Diagnosis not present

## 2020-02-09 DIAGNOSIS — Z886 Allergy status to analgesic agent status: Secondary | ICD-10-CM | POA: Diagnosis not present

## 2020-02-09 DIAGNOSIS — R9439 Abnormal result of other cardiovascular function study: Secondary | ICD-10-CM | POA: Diagnosis not present

## 2020-02-09 DIAGNOSIS — E785 Hyperlipidemia, unspecified: Secondary | ICD-10-CM | POA: Diagnosis not present

## 2020-02-09 DIAGNOSIS — K219 Gastro-esophageal reflux disease without esophagitis: Secondary | ICD-10-CM | POA: Diagnosis not present

## 2020-02-09 DIAGNOSIS — Z923 Personal history of irradiation: Secondary | ICD-10-CM | POA: Diagnosis not present

## 2020-02-09 DIAGNOSIS — I209 Angina pectoris, unspecified: Secondary | ICD-10-CM

## 2020-02-09 HISTORY — PX: LEFT HEART CATH AND CORONARY ANGIOGRAPHY: CATH118249

## 2020-02-09 LAB — GLUCOSE, CAPILLARY: Glucose-Capillary: 78 mg/dL (ref 70–99)

## 2020-02-09 LAB — CBC
HCT: 41.6 % (ref 36.0–46.0)
Hemoglobin: 13.1 g/dL (ref 12.0–15.0)
MCH: 28.5 pg (ref 26.0–34.0)
MCHC: 31.5 g/dL (ref 30.0–36.0)
MCV: 90.6 fL (ref 80.0–100.0)
Platelets: 282 10*3/uL (ref 150–400)
RBC: 4.59 MIL/uL (ref 3.87–5.11)
RDW: 12.2 % (ref 11.5–15.5)
WBC: 4.8 10*3/uL (ref 4.0–10.5)
nRBC: 0 % (ref 0.0–0.2)

## 2020-02-09 SURGERY — LEFT HEART CATH AND CORONARY ANGIOGRAPHY
Anesthesia: LOCAL

## 2020-02-09 MED ORDER — ASPIRIN 81 MG PO CHEW: 81.0000 mg | CHEWABLE_TABLET | ORAL | Status: DC

## 2020-02-09 MED ORDER — SODIUM CHLORIDE 0.9 % WEIGHT BASED INFUSION
3.0000 mL/kg/h | INTRAVENOUS | Status: AC
  Administered 2020-02-09: 3 mL/kg/h via INTRAVENOUS

## 2020-02-09 MED ORDER — IOHEXOL 350 MG/ML SOLN
INTRAVENOUS | Status: DC | PRN
  Administered 2020-02-09: 16:00:00 30 mL via INTRA_ARTERIAL

## 2020-02-09 MED ORDER — LIDOCAINE HCL (PF) 1 % IJ SOLN
INTRAMUSCULAR | Status: DC | PRN
  Administered 2020-02-09: 2 mL

## 2020-02-09 MED ORDER — SODIUM CHLORIDE 0.9% FLUSH: 3.0000 mL | INTRAVENOUS | Status: DC | PRN

## 2020-02-09 MED ORDER — HEPARIN SODIUM (PORCINE) 1000 UNIT/ML IJ SOLN
INTRAMUSCULAR | Status: DC | PRN
  Administered 2020-02-09: 4500 [IU] via INTRAVENOUS

## 2020-02-09 MED ORDER — PERFLUTREN LIPID MICROSPHERE
1.0000 mL | INTRAVENOUS | Status: AC | PRN
  Administered 2020-02-09: 2 mL via INTRAVENOUS
  Filled 2020-02-09: qty 10

## 2020-02-09 MED ORDER — SODIUM CHLORIDE 0.9 % IV SOLN: 250.0000 mL | INTRAVENOUS | Status: DC | PRN

## 2020-02-09 MED ORDER — VERAPAMIL HCL 2.5 MG/ML IV SOLN
INTRAVENOUS | Status: AC
  Filled 2020-02-09: qty 2

## 2020-02-09 MED ORDER — HEPARIN (PORCINE) IN NACL 1000-0.9 UT/500ML-% IV SOLN
INTRAVENOUS | Status: AC
  Filled 2020-02-09: qty 1000

## 2020-02-09 MED ORDER — LIDOCAINE HCL (PF) 1 % IJ SOLN
INTRAMUSCULAR | Status: AC
  Filled 2020-02-09: qty 30

## 2020-02-09 MED ORDER — HEPARIN (PORCINE) IN NACL 1000-0.9 UT/500ML-% IV SOLN
INTRAVENOUS | Status: DC | PRN
  Administered 2020-02-09 (×2): 500 mL

## 2020-02-09 MED ORDER — MIDAZOLAM HCL 2 MG/2ML IJ SOLN
INTRAMUSCULAR | Status: AC
  Filled 2020-02-09: qty 2

## 2020-02-09 MED ORDER — FENTANYL CITRATE (PF) 100 MCG/2ML IJ SOLN
INTRAMUSCULAR | Status: DC | PRN
  Administered 2020-02-09: 25 ug via INTRAVENOUS
  Administered 2020-02-09: 50 ug via INTRAVENOUS

## 2020-02-09 MED ORDER — SODIUM CHLORIDE 0.9 % WEIGHT BASED INFUSION: 1.0000 mL/kg/h | INTRAVENOUS | Status: DC

## 2020-02-09 MED ORDER — MIDAZOLAM HCL 2 MG/2ML IJ SOLN
INTRAMUSCULAR | Status: DC | PRN
  Administered 2020-02-09: 1 mg via INTRAVENOUS
  Administered 2020-02-09: 2 mg via INTRAVENOUS

## 2020-02-09 MED ORDER — ASPIRIN 81 MG PO CHEW
81.0000 mg | CHEWABLE_TABLET | ORAL | Status: AC
  Administered 2020-02-09: 81 mg via ORAL
  Filled 2020-02-09: qty 1

## 2020-02-09 MED ORDER — HEPARIN SODIUM (PORCINE) 1000 UNIT/ML IJ SOLN
INTRAMUSCULAR | Status: AC
  Filled 2020-02-09: qty 1

## 2020-02-09 MED ORDER — SODIUM CHLORIDE 0.9% FLUSH: 3.0000 mL | Freq: Two times a day (BID) | INTRAVENOUS | Status: DC

## 2020-02-09 MED ORDER — VERAPAMIL HCL 2.5 MG/ML IV SOLN
INTRAVENOUS | Status: DC | PRN
  Administered 2020-02-09: 10 mL via INTRA_ARTERIAL

## 2020-02-09 MED ORDER — FENTANYL CITRATE (PF) 100 MCG/2ML IJ SOLN
INTRAMUSCULAR | Status: AC
  Filled 2020-02-09: qty 2

## 2020-02-09 SURGICAL SUPPLY — 11 items
CATH OPTITORQUE TIG 4.0 5F (CATHETERS) ×2 IMPLANT
DEVICE RAD COMP TR BAND LRG (VASCULAR PRODUCTS) ×2 IMPLANT
GLIDESHEATH SLEND A-KIT 6F 22G (SHEATH) ×2 IMPLANT
GUIDEWIRE INQWIRE 1.5J.035X260 (WIRE) ×1 IMPLANT
INQWIRE 1.5J .035X260CM (WIRE) ×2
KIT HEART LEFT (KITS) ×2 IMPLANT
PACK CARDIAC CATHETERIZATION (CUSTOM PROCEDURE TRAY) ×2 IMPLANT
SHEATH PROBE COVER 6X72 (BAG) ×2 IMPLANT
SYR MEDRAD MARK 7 150ML (SYRINGE) ×2 IMPLANT
TRANSDUCER W/STOPCOCK (MISCELLANEOUS) ×2 IMPLANT
TUBING CIL FLEX 10 FLL-RA (TUBING) ×2 IMPLANT

## 2020-02-09 NOTE — Discharge Instructions (Signed)
No Metformin/ Glucophage for 2 days  Radial Site Care  This sheet gives you information about how to care for yourself after your procedure. Your health care provider may also give you more specific instructions. If you have problems or questions, contact your health care provider. What can I expect after the procedure? After the procedure, it is common to have:  Bruising and tenderness at the catheter insertion area. Follow these instructions at home: Medicines  Take over-the-counter and prescription medicines only as told by your health care provider. Insertion site care  Follow instructions from your health care provider about how to take care of your insertion site. Make sure you: ? Wash your hands with soap and water before you change your bandage (dressing). If soap and water are not available, use hand sanitizer. ? Change your dressing as told by your health care provider. ? Leave stitches (sutures), skin glue, or adhesive strips in place. These skin closures may need to stay in place for 2 weeks or longer. If adhesive strip edges start to loosen and curl up, you may trim the loose edges. Do not remove adhesive strips completely unless your health care provider tells you to do that.  Check your insertion site every day for signs of infection. Check for: ? Redness, swelling, or pain. ? Fluid or blood. ? Pus or a bad smell. ? Warmth.  Do not take baths, swim, or use a hot tub until your health care provider approves.  You may shower 24-48 hours after the procedure, or as directed by your health care provider. ? Remove the dressing and gently wash the site with plain soap and water. ? Pat the area dry with a clean towel. ? Do not rub the site. That could cause bleeding.  Do not apply powder or lotion to the site. Activity   For 24 hours after the procedure, or as directed by your health care provider: ? Do not flex or bend the affected arm. ? Do not push or pull heavy  objects with the affected arm. ? Do not drive yourself home from the hospital or clinic. You may drive 24 hours after the procedure unless your health care provider tells you not to. ? Do not operate machinery or power tools.  Do not lift anything that is heavier than 10 lb (4.5 kg), or the limit that you are told, until your health care provider says that it is safe.  Ask your health care provider when it is okay to: ? Return to work or school. ? Resume usual physical activities or sports. ? Resume sexual activity. General instructions  If the catheter site starts to bleed, raise your arm and put firm pressure on the site. If the bleeding does not stop, get help right away. This is a medical emergency.  If you went home on the same day as your procedure, a responsible adult should be with you for the first 24 hours after you arrive home.  Keep all follow-up visits as told by your health care provider. This is important. Contact a health care provider if:  You have a fever.  You have redness, swelling, or yellow drainage around your insertion site. Get help right away if:  You have unusual pain at the radial site.  The catheter insertion area swells very fast.  The insertion area is bleeding, and the bleeding does not stop when you hold steady pressure on the area.  Your arm or hand becomes pale, cool, tingly, or numb.  These symptoms may represent a serious problem that is an emergency. Do not wait to see if the symptoms will go away. Get medical help right away. Call your local emergency services (911 in the [place] Do not drive yourself to the hospital. Summary  After the procedure, it is common to have bruising and tenderness at the site.  Follow instructions from your health care provider about how to take care of your radial site wound. Check the wound every day for signs of infection.  Do not lift anything that is heavier than 10 lb (4.5 kg), or the limit that you are told,  until your health care provider says that it is safe. This information is not intended to replace advice given to you by your health care provider. Make sure you discuss any questions you have with your health care provider. Document Revised: 11/13/2017 Document Reviewed: 11/13/2017 Elsevier Patient Education  2020 [employer].

## 2020-02-09 NOTE — H&P (Signed)
Jordan Willis is an 69 y.o. female.   Chief Complaint: Chest pain HPI:   69 y.o. African American female  with hypertension, type 2 DM, hyperlipidemia, with angina, high risk stress test  Past Medical History:  Diagnosis Date  . Anxiety   . Arthritis    Lumbar spine DDD.  Marland Kitchen Breast cancer, female, left 03/03/2012  . Carpal tunnel syndrome    Bilateral, Mild  . Chronic sinusitis   . 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 Diabetes mellitus without complication ([place])    Diet controlled  . Dyspnea    When patient gets sick uses Pro-Air inhaler  . Frequent sinus infections   . GERD (gastroesophageal reflux disease)    occ  . Goiter   . History of cardiomegaly   . History of cataract    Bilateral  . History of kidney stones    08/28/2018 currently has a large kidney stone, asymptomatic at this time  . History of migraine   . History of uterine fibroid   . History of uterine prolapse   . Hyperlipidemia   . Hypertension   . Menopause 03/03/2012  . Nasal turbinate hypertrophy 11/29/2016   Left inferior   . Personal history of chemotherapy   . Personal history of radiation therapy   . PONV (postoperative nausea and vomiting)   . Sinusitis 2014  . Sleep apnea    No CPAP  . SVD (spontaneous vaginal delivery)    x 2  . Thyroid nodule     Past Surgical History:  Procedure Laterality Date  . BILATERAL SALPINGOOPHORECTOMY Bilateral   . BLADDER SUSPENSION N/A 05/16/2017   Procedure: TRANSVAGINAL TAPE (TVT) PROCEDURE;  Surgeon: Everett Graff, MD;  Location: [facility];  Service: Gynecology;  Laterality: N/A;  . BREAST BIOPSY Right 2009  . BREAST LUMPECTOMY Right 2009  . BREAST LUMPECTOMY Left 2006  . CATARACT EXTRACTION Left 10/23/2017  . CATARACT EXTRACTION Right 11/06/2017  . COLONOSCOPY  10/22/2009   Normal.  Repeat 5 years.  Nat [name]  . CYSTOSCOPY N/A 05/16/2017   Procedure: CYSTOSCOPY;  Surgeon: Everett Graff, MD;  Location: [facility];  Service: Gynecology;   Laterality: N/A;  . ROBOTIC ASSISTED TOTAL HYSTERECTOMY N/A 05/16/2017   Procedure: ROBOTIC ASSISTED TOTAL HYSTERECTOMY;  Surgeon: Delsa Bern, MD;  Location: [facility];  Service: Gynecology;  Laterality: N/A;  . ROTATOR CUFF REPAIR Bilateral   . SHOULDER ARTHROSCOPY WITH ROTATOR CUFF REPAIR AND SUBACROMIAL DECOMPRESSION Right 09/03/2018   Procedure: Right shoulder manipulation under anesthesia, exam under anesthesia, mini open rotator cuff repair, subacromial decompression;  Surgeon: Susa Day, MD;  Location: [facility];  Service: Orthopedics;  Laterality: Right;  90 mins  . TUBAL LIGATION    . WISDOM TOOTH EXTRACTION      Family History  Problem Relation Age of Onset  . Cancer Mother 77       breast cancer  . Hypertension Mother   . Diabetes Father   . Cancer Sister        breast cancer  . Breast cancer Sister   . Cancer Sister        breast cancer  . Breast cancer Sister    Social History:  reports that she has never smoked. She has never used smokeless tobacco. She reports that she does not drink alcohol or use drugs.  Allergies:  Allergies  Allergen Reactions  . Aspirin Palpitations and Other (See Comments)    Heart fluttering   . Tape  Rash    ROS   There were no vitals taken for this visit. There is no height or weight on file to calculate BMI.  Physical Exam  No results found for this or any previous visit (from the past 48 hour(s)).  Labs:   Lab Results  Component Value Date   WBC 5.8 01/05/2020   HGB 12.5 01/05/2020   HCT 39.4 01/05/2020   MCV 91.0 01/05/2020   PLT 272 01/05/2020   No results for input(s): NA, K, CL, CO2, BUN, CREATININE, CALCIUM, PROT, BILITOT, ALKPHOS, ALT, AST, GLUCOSE in the last 168 hours.  Invalid input(s): LABALBU  Lipid Panel     Component Value Date/Time   CHOL 175 01/27/2020 0952   TRIG 74 01/27/2020 0952   HDL 44 01/27/2020 0952   CHOLHDL 6.2 10/11/2013 1032   VLDL 23 10/11/2013 1032   LDLCALC 117 (H) 01/27/2020 0952     BNP (last 3 results) No results for input(s): BNP in the last 8760 hours.  HEMOGLOBIN A1C Lab Results  Component Value Date   HGBA1C 6.1 (H) 08/28/2018   MPG 128.37 08/28/2018    Cardiac Panel (last 3 results) No results for input(s): CKTOTAL, CKMB, RELINDX in the last 8760 hours.  Invalid input(s): TROPONINHS  No results found for: CKTOTAL, CKMB, CKMBINDEX   TSH No results for input(s): TSH in the last 8760 hours.   No medications prior to admission.     No current facility-administered medications for this encounter.  Current Outpatient Medications:  .  aspirin 81 MG EC tablet, Take 81 mg by mouth every evening. Swallow whole. , Disp: , Rfl:  .  atorvastatin (LIPITOR) 10 MG tablet, Take 10 mg by mouth daily. Mid-Morning, Disp: , Rfl:  .  b complex vitamins tablet, Take 1 tablet by mouth daily., Disp: , Rfl:  .  Calcium Carbonate (CALCIUM 600 PO), Take 600 mg by mouth daily., Disp: , Rfl:  .  cetirizine (ZYRTEC) 10 MG tablet, Take 10 mg by mouth daily., Disp: , Rfl:  .  Cholecalciferol (VITAMIN D3) 2000 units TABS, Take 2,000 Units by mouth daily., Disp: , Rfl:  .  dexlansoprazole (DEXILANT) 60 MG capsule, Take 60 mg by mouth daily., Disp: , Rfl:  .  GLUCOSAMINE-CHONDROITIN PO, Take 1 tablet by mouth every evening. , Disp: , Rfl:  .  metFORMIN (GLUCOPHAGE) 500 MG tablet, Take 500 mg by mouth daily at 12 noon. , Disp: , Rfl:  .  Multiple Vitamin (MULTIVITAMIN WITH MINERALS) TABS tablet, Take 1 tablet by mouth daily., Disp: , Rfl:  .  nitroGLYCERIN (NITROSTAT) 0.4 MG SL tablet, Place 1 tablet (0.4 mg total) under the tongue every 5 (five) minutes as needed for chest pain. If you require more than two tablets five minutes apart go to the nearest ER via EMS., Disp: 90 tablet, Rfl: 0 .  Polyethyl Glycol-Propyl Glycol (ULTRA LUBRICANT EYE DROPS OP), Place 1 drop into both eyes 3 (three) times daily as needed (for dry eyes). , Disp: , Rfl:  .  PROAIR HFA 108 (90 Base) MCG/ACT  inhaler, Inhale 2 puffs into the lungs every 6 (six) hours as needed for wheezing or shortness of breath. , Disp: , Rfl:  .  sertraline (ZOLOFT) 50 MG tablet, Take 50 mg by mouth daily. , Disp: , Rfl:  .  solifenacin (VESICARE) 10 MG tablet, Take 10 mg by mouth daily. , Disp: , Rfl:  .  traZODone (DESYREL) 50 MG tablet, Take 50 mg by mouth at  bedtime as needed for sleep. , Disp: , Rfl:  .  losartan (COZAAR) 50 MG tablet, Take 1 tablet (50 mg total) by mouth every morning., Disp: 90 tablet, Rfl: 1   Vitals pending   CARDIAC STUDIES:  Lexiscan Tetrofosmin stress test 02/01/2020: No previous exam available for comparison. Lexiscan nuclear stress test performed using 1-day protocol. Stress EKG is non-diagnostic, as this is pharmacological stress test.  Decreased myocardial thickening in anteroseptal, inferoseptal, and inferior myocardium. Stress LVEF 31%.  SPECT images show large sized, medium intensity, predominantly reversible perfusion defect in mid to basal anteroseptal, inferodeptal, and inferior myocardium. SDS=14. High risk study.  Echocardiogram 02/01/2020:  Left ventricle cavity is normal in size. Mild concentric hypertrophy of  the left ventricle. Mild global hypokinesis with thin dyskinetic apex.  Cannot exclude apical thrombus. Consider contrast study, if clinically  indicated. LVEF 40-45%. Grade 1 diastolic dysfunction. Normal LAP.  Left atrial cavity is moderately dilated.  Moderate (Grade II) mitral regurgitation.  Mild tricuspid regurgitation. Estimated pulmonary artery systolic pressure  is 24 mmHg.  Moderate pulmonic regurgitation.  EKG 01/20/2020: Normal sinus rhythm with a ventricular rate of 78 bpm, normal axis deviation, left bundle branch block, poor R wave progression, ST-T changes most likely secondary to LBBB, cannot entirely rule out anteroseptal infarct.   Assessment/Plan  69 y.o. African American female  with hypertension, type 2 DM, hyperlipidemia, with  angina, high risk stress test  Plan for coronary angiography, possible intervention. Will need limited Definity contrast echo before cath to rule out LV apical thrombus  Nigel Mormon, MD 02/09/2020, 11:44 AM [facility] Pager: [phone] Office: [phone] If no answer: [phone]

## 2020-02-09 NOTE — Telephone Encounter (Signed)
Agree.  ? ?ST

## 2020-02-09 NOTE — Interval H&P Note (Signed)
History and Physical Interval Note:  02/09/2020 3:30 PM  Jordan Willis  has presented today for surgery, with the diagnosis of positive stress test.  The various methods of treatment have been discussed with the patient and family. After consideration of risks, benefits and other options for treatment, the patient has consented to  Procedure(s): LEFT HEART CATH AND CORONARY ANGIOGRAPHY (N/A) as a surgical intervention.  The patient's history has been reviewed, patient examined, no change in status, stable for surgery.  I have reviewed the patient's chart and labs.  Questions were answered to the patient's satisfaction.    2016/2017 Appropriate Use Criteria for Coronary Revascularization Clinical Presentation: Diabetes Mellitus? Symptom Status? S/P CABG? Antianginal Therapy (# of long-acting drugs)? Results of Non-invasive testing? FFR/iFR results in all diseased vessels? Patient undergoing renal transplant? Patient undergoing percutaneous valve procedure (TAVR, MitraClip, Others)? Symptom Status:  Ischemic Symptoms  Non-invasive Testing:  High risk  If no or indeterminate stress test, FFR/iFR results in all diseased vessels:  N/A  Diabetes Mellitus:  No  S/P CABG:  No  Antianginal therapy (number of long-acting drugs):  0  Patient undergoing renal transplant:  No  Patient undergoing percutaneous valve procedure:  No    newline 1 Vessel Disease PCI CABG  No proximal LAD involvement, No proximal left dominant LCX involvement M (5); Indication 2 M (4); Indication 2   Proximal left dominant LCX involvement M (6); Indication 5 M (6); Indication 5   Proximal LAD involvement M (6); Indication 5 M (6); Indication 5   newline 2 Vessel Disease  No proximal LAD involvement M (6); Indication 8 M (5); Indication 8   Proximal LAD involvement A (7); Indication 11 A (7); Indication 11   newline 3 Vessel Disease  Low disease complexity (e.g., focal stenoses, SYNTAX <=22) A (7); Indication 17 A (7);  Indication 17   Intermediate or high disease complexity (e.g., SYNTAX >=23) M (6); Indication 21 A (7); Indication 21   newline Left Main Disease  Isolated LMCA disease: ostial or midshaft A (7); Indication 24 A (8); Indication 24   Isolated LMCA disease: bifurcation involvement M (5); Indication 25 A (8); Indication 25   LMCA ostial or midshaft, concurrent low disease burden multivessel disease (e.g., 1-2 additional focal stenoses, SYNTAX <=22) M (6); Indication 26 A (9); Indication 26   LMCA ostial or midshaft, concurrent intermediate or high disease burden multivessel disease (e.g., 1-2 additional bifurcation stenoses, long stenoses, SYNTAX >=23) M (4); Indication 27 A (9); Indication 27   LMCA bifurcation involvement, concurrent low disease burden multivessel disease (e.g., 1-2 additional focal stenoses, SYNTAX <=22) M (5); Indication 28 A (8); Indication 28   LMCA bifurcation involvement, concurrent intermediate or high disease burden multivessel disease (e.g., 1-2 additional bifurcation stenoses, long stenoses, SYNTAX >=23) R (3); Indication 29 A (9); Indication [place]

## 2020-02-09 NOTE — Progress Notes (Signed)
  Echocardiogram 2D Echocardiogram has been performed.  Geoffery Lyons Swaim 02/09/2020, 2:22 PM

## 2020-02-11 ENCOUNTER — Other Ambulatory Visit: Payer: Self-pay

## 2020-02-11 ENCOUNTER — Ambulatory Visit: Payer: Medicare Other | Admitting: Cardiology

## 2020-02-11 ENCOUNTER — Encounter: Payer: Self-pay | Admitting: Cardiology

## 2020-02-11 VITALS — BP 146/63 | HR 75 | Temp 97.6°F | Resp 16 | Ht 67.0 in | Wt 191.3 lb

## 2020-02-11 DIAGNOSIS — I428 Other cardiomyopathies: Secondary | ICD-10-CM

## 2020-02-11 DIAGNOSIS — E1165 Type 2 diabetes mellitus with hyperglycemia: Secondary | ICD-10-CM | POA: Diagnosis not present

## 2020-02-11 DIAGNOSIS — I447 Left bundle-branch block, unspecified: Secondary | ICD-10-CM

## 2020-02-11 DIAGNOSIS — E119 Type 2 diabetes mellitus without complications: Secondary | ICD-10-CM | POA: Diagnosis not present

## 2020-02-11 DIAGNOSIS — E782 Mixed hyperlipidemia: Secondary | ICD-10-CM | POA: Diagnosis not present

## 2020-02-11 DIAGNOSIS — I10 Essential (primary) hypertension: Secondary | ICD-10-CM | POA: Diagnosis not present

## 2020-02-11 LAB — ECHOCARDIOGRAM LIMITED
Height: 67 in
Weight: 3088 oz

## 2020-02-11 MED ORDER — ENTRESTO 24-26 MG PO TABS: 1.0000 | ORAL_TABLET | Freq: Two times a day (BID) | ORAL | 0 refills | Status: DC

## 2020-02-11 MED ORDER — METOPROLOL SUCCINATE ER 25 MG PO TB24: 25.0000 mg | ORAL_TABLET | Freq: Every morning | ORAL | 0 refills | Status: DC

## 2020-02-11 NOTE — Patient Instructions (Signed)
Please remember to bring in your medication bottles in at the next visit.   New Medications that were added at today's visit:  Entresto 24/26 mg p.o. twice daily. Toprol-XL 25 mg p.o. every morning  Medications that were discontinued at today's visit: Losartan  Office will call you to have the following tests scheduled:  None  Please get labs done in about 1 week after starting Entresto at the nearest [place] follow up with your PCP as scheduled.

## 2020-02-11 NOTE — Progress Notes (Signed)
[name] Date of Birth: 10/31/1950 MRN: [id number] Primary Care Provider:Pharr, Thayer Jew, MD  Former Cardiology Providers: Miquel Dunn, MSN, APRN, FNP-C Primary Cardiologist: Rex Kras, DO (established care 01/20/2020)  Date: 02/11/20 Last Office Visit: 02/04/2020  Chief Complaint  Patient presents with  . Follow-up    Cath Follow Up    HPI  Jordan Willis is a 69 y.o.  female who presents to the office with a chief complaint of " heart cath follow-up." Patient's past medical history and cardiovascular risk factors include: Left bundle branch block, type 2 diabetes mellitus, hyperlipidemia, nonischemic cardiomyopathy, postmenopausal female, advanced age.   Patient establish care with myself back in March 2021 when she came for evaluation of chest pain.  At that time patient stated that she was having chest pain that was predominantly more pronounced for 2 weeks prior.  The pain was substernally located, intensity 9 out of 10, nonradiating, last for 15 to 20 minutes, improved with deep breath and self-limited.  Not always associated with effort related activities.  She had gone to the ER on January 05, 2020 due to's similar symptoms and work-up was noted to be negative and was discharged.  She followed up at the office and was recommended to undergo an echo and stress test.  Patient was brought in sooner than her scheduled appointment given the abnormal nuclear stress test.  She was recommended to undergo left heart catheterization with possible intervention given the abnormal nuclear stress test findings.  In addition, the echocardiogram performed in the office in April 2021 noted LVEF of 40-45% with grade 1 diastolic impairment, moderately dilated left atrium, moderate MR, mild TR, no pulmonary hypertension and moderate PR.  Of note, there is a concern for possible apical thrombus given the dyskinetic apex.   Prior to her left heart catheterization she underwent a limited echo  with contrast which showed mild to moderately reduced left ventricular systolic function with LVEF of 40-45% and no evidence of apical thrombus.  She subsequently underwent angiography and was noted to have angiographically normal epicardial coronary arteries.    She presents here for follow-up.  Patient states that she has shortness of breath with effort related activities when she does more that she can tolerate.  She is not having shortness of breath or chest pain with regular day-to-day activities.  I spoke to both the patient and her daughter at today's office visit and went over the images of both the heart catheterization and echocardiogram with them in great detail.  Upon reviewing of the images they realized that the heart function is reduced and she does have regional wall motion abnormalities.  Had a detailed discussion with both of them that now the goal will be to uptitrate medications to maximally tolerated guideline directed medical therapy to improve her systolic function.    No family history of premature coronary artery disease  History of nonischemic cardiomyopathy. Denies prior history of coronary artery disease, myocardial infarction, congestive heart failure, deep venous thrombosis, pulmonary embolism, stroke, transient ischemic attack.  FUNCTIONAL STATUS: No structured exercise program or daily routine.    ALLERGIES: Allergies  Allergen Reactions  . Aspirin Palpitations and Other (See Comments)    Heart fluttering   . Tape Rash   MEDICATION LIST PRIOR TO VISIT: Current Outpatient Medications on File Prior to Visit  Medication Sig Dispense Refill  . aspirin 81 MG EC tablet Take 81 mg by mouth every evening. Swallow whole.     Marland Kitchen atorvastatin (  LIPITOR) 10 MG tablet Take 10 mg by mouth daily. Mid-Morning    . b complex vitamins tablet Take 1 tablet by mouth daily.    . Calcium Carbonate (CALCIUM 600 PO) Take 600 mg by mouth daily.    . cetirizine (ZYRTEC) 10 MG tablet Take  10 mg by mouth daily.    . Cholecalciferol (VITAMIN D3) 2000 units TABS Take 2,000 Units by mouth daily.    Marland Kitchen dexlansoprazole (DEXILANT) 60 MG capsule Take 60 mg by mouth daily.    Marland Kitchen GLUCOSAMINE-CHONDROITIN PO Take 1 tablet by mouth every evening.     . metFORMIN (GLUCOPHAGE) 500 MG tablet Take 500 mg by mouth daily at 12 noon.     . Multiple Vitamin (MULTIVITAMIN WITH MINERALS) TABS tablet Take 1 tablet by mouth daily.    . nitroGLYCERIN (NITROSTAT) 0.4 MG SL tablet Place 1 tablet (0.4 mg total) under the tongue every 5 (five) minutes as needed for chest pain. If you require more than two tablets five minutes apart go to the nearest ER via EMS. 90 tablet 0  . Polyethyl Glycol-Propyl Glycol (ULTRA LUBRICANT EYE DROPS OP) Place 1 drop into both eyes 3 (three) times daily as needed (for dry eyes).     Marland Kitchen PROAIR HFA 108 (90 Base) MCG/ACT inhaler Inhale 2 puffs into the lungs every 6 (six) hours as needed for wheezing or shortness of breath.     . sertraline (ZOLOFT) 50 MG tablet Take 50 mg by mouth daily.     . solifenacin (VESICARE) 10 MG tablet Take 10 mg by mouth daily.     . traZODone (DESYREL) 50 MG tablet Take 50 mg by mouth at bedtime as needed for sleep.      No current facility-administered medications on file prior to visit.    PAST MEDICAL HISTORY: Past Medical History:  Diagnosis Date  . Anxiety   . Arthritis    Lumbar spine DDD.  Marland Kitchen Breast cancer, female, left 03/03/2012  . Carpal tunnel syndrome    Bilateral, Mild  . Chronic sinusitis   . 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 Diabetes mellitus without complication ([place])    Diet controlled  . Dyspnea    When patient gets sick uses Pro-Air inhaler  . Frequent sinus infections   . GERD (gastroesophageal reflux disease)    occ  . Goiter   . History of cardiomegaly   . History of cataract    Bilateral  . History of kidney stones    08/28/2018 currently has a large kidney stone, asymptomatic at this  time  . History of migraine   . History of uterine fibroid   . History of uterine prolapse   . Hyperlipidemia   . Hypertension   . Menopause 03/03/2012  . Nasal turbinate hypertrophy 11/29/2016   Left inferior   . Personal history of chemotherapy   . Personal history of radiation therapy   . PONV (postoperative nausea and vomiting)   . Sinusitis 2014  . Sleep apnea    No CPAP  . SVD (spontaneous vaginal delivery)    x 2  . Thyroid nodule     PAST SURGICAL HISTORY: Past Surgical History:  Procedure Laterality Date  . BILATERAL SALPINGOOPHORECTOMY Bilateral   . BLADDER SUSPENSION N/A 05/16/2017   Procedure: TRANSVAGINAL TAPE (TVT) PROCEDURE;  Surgeon: Everett Graff, MD;  Location: [facility];  Service: Gynecology;  Laterality: N/A;  . BREAST BIOPSY Right 2009  . BREAST LUMPECTOMY Right 2009  .  BREAST LUMPECTOMY Left 2006  . CATARACT EXTRACTION Left 10/23/2017  . CATARACT EXTRACTION Right 11/06/2017  . COLONOSCOPY  10/22/2009   Normal.  Repeat 5 years.  Nat [name]  . CYSTOSCOPY N/A 05/16/2017   Procedure: CYSTOSCOPY;  Surgeon: Everett Graff, MD;  Location: [facility];  Service: Gynecology;  Laterality: N/A;  . LEFT HEART CATH AND CORONARY ANGIOGRAPHY N/A 02/09/2020   Procedure: LEFT HEART CATH AND CORONARY ANGIOGRAPHY;  Surgeon: Nigel Mormon, MD;  Location: [facility];  Service: Cardiovascular;  Laterality: N/A;  . ROBOTIC ASSISTED TOTAL HYSTERECTOMY N/A 05/16/2017   Procedure: ROBOTIC ASSISTED TOTAL HYSTERECTOMY;  Surgeon: Delsa Bern, MD;  Location: [facility];  Service: Gynecology;  Laterality: N/A;  . ROTATOR CUFF REPAIR Bilateral   . SHOULDER ARTHROSCOPY WITH ROTATOR CUFF REPAIR AND SUBACROMIAL DECOMPRESSION Right 09/03/2018   Procedure: Right shoulder manipulation under anesthesia, exam under anesthesia, mini open rotator cuff repair, subacromial decompression;  Surgeon: Susa Day, MD;  Location: [facility];  Service: Orthopedics;  Laterality: Right;  90 mins  . TUBAL  LIGATION    . WISDOM TOOTH EXTRACTION      FAMILY HISTORY: The patient's family history includes Breast cancer in her sister and sister; Cancer in her sister and sister; Cancer (age of onset: 66) in her mother; Diabetes in her father; Hypertension in her mother.   SOCIAL HISTORY:  The patient  reports that she has never smoked. She has never used smokeless tobacco. She reports that she does not drink alcohol or use drugs.  Review of Systems  Constitution: Negative for chills and fever.  HENT: Negative for ear discharge, ear pain and nosebleeds.   Eyes: Negative for blurred vision and discharge.  Cardiovascular: Negative for chest pain, claudication, dyspnea on exertion, leg swelling, near-syncope, orthopnea, palpitations, paroxysmal nocturnal dyspnea and syncope.  Respiratory: Positive for shortness of breath. Negative for cough.   Endocrine: Negative for polydipsia, polyphagia and polyuria.  Hematologic/Lymphatic: Negative for bleeding problem.  Skin: Negative for flushing and nail changes.  Musculoskeletal: Negative for muscle cramps, muscle weakness and myalgias.  Gastrointestinal: Negative for abdominal pain, dysphagia, hematemesis, hematochezia, melena, nausea and vomiting.  Neurological: Negative for dizziness, focal weakness and light-headedness.  Psychiatric/Behavioral:       Grieving the loss of her sister.    PHYSICAL EXAM: Vitals with BMI 02/11/2020 02/09/2020 02/09/2020  Height 5\' 7"  - -  Weight 191 lbs 5 oz - -  BMI [id number] - -  Systolic [id number]  Diastolic 63 62 70  Pulse 75 74 76    CONSTITUTIONAL: Well-developed and well-nourished. No acute distress.  SKIN: Skin is warm and dry. No rash noted. No cyanosis. No pallor. No jaundice HEAD: Normocephalic and atraumatic.  EYES: No scleral icterus MOUTH/THROAT: Moist oral membranes.  NECK: No JVD present. No thyromegaly noted. No carotid bruits  LYMPHATIC: No visible cervical adenopathy.  CHEST Normal respiratory  effort. No intercostal retractions  LUNGS: Clear to auscultation bilaterally.  No stridor. No wheezes. No rales.  CARDIOVASCULAR: Regular rate and rhythm, positive S1-S2, no murmurs rubs or gallops appreciated. ABDOMINAL: No apparent ascites.  EXTREMITIES: No peripheral edema  HEMATOLOGIC: No significant bruising NEUROLOGIC: Oriented to person, place, and time. Nonfocal. Normal muscle tone.  PSYCHIATRIC: Normal mood and affect. Normal behavior. Cooperative  CARDIAC DATABASE: EKG: 01/20/2020: Normal sinus rhythm with a ventricular rate of 78 bpm, normal axis deviation, left bundle branch block, poor R wave progression, ST-T changes most likely secondary to LBBB, cannot entirely rule out anteroseptal  infarct.  Echocardiogram: 02/13/2017 at [facility]: EF 60-65%, no regional wall motion abnormalities, mild MR.  02/01/2020: LVEF 40-45% with regional wall motion abnormalities, mild LVH,, grade 1 diastolic impairment, normal left atrial pressure, moderate MR, mild TR, RVSP 24 mmHg, moderate PR.  02/09/2020 limited echo with Definity: LVEF 40-45% with regional wall motion abnormalities, no left ventricular thrombus.  Stress Testing:  02/14/2018 exercise nuclear stress test at [facility]: Achieved 7 METS, normal blood pressure response to exercise, had exertional chest pain that improved/resolved during recovery, normal myocardial perfusion study no EKG abnormalities per report.  Heart Catheterization: 02/09/2020 by Dr. Joya Gaskins Patwardhan: Right dominant system, no angiographically significant coronary artery disease.  Normal LVEDP.  LABORATORY DATA: CBC Latest Ref Rng & Units 02/09/2020 01/05/2020 09/04/2018  WBC 4.0 - 10.5 K/uL 4.8 5.8 12.0(H)  Hemoglobin 12.0 - 15.0 g/dL 13.1 12.5 12.9  Hematocrit 36.0 - 46.0 % 41.6 39.4 41.3  Platelets 150 - 400 K/uL 282 272 259    CMP Latest Ref Rng & Units 01/27/2020 01/05/2020 09/04/2018  Glucose 65 - 99 mg/dL 122(H) 96 149(H)  BUN 8 -  27 mg/dL 14 9 16   Creatinine 0.57 - 1.00 mg/dL 0.64 0.57 0.74  Sodium 134 - 144 mmol/L 143 139 136  Potassium 3.5 - 5.2 mmol/L 4.5 3.5 4.3  Chloride 96 - 106 mmol/L 104 103 103  CO2 20 - 29 mmol/L 22 26 22   Calcium 8.7 - 10.3 mg/dL 10.0 9.5 9.3  Total Protein 6.4 - 8.3 g/dL - - -  Total Bilirubin 0.20 - 1.20 mg/dL - - -  Alkaline Phos 40 - 150 U/L - - -  AST 5 - 34 U/L - - -  ALT 0 - 55 U/L - - -    Lipid Panel: Outside records 06/24/2018: Total cholesterol 176, triglycerides 104, HDL 52, LDL 103  Lipid Panel     Component Value Date/Time   CHOL 175 01/27/2020 0952   TRIG 74 01/27/2020 0952   HDL 44 01/27/2020 0952   CHOLHDL 6.2 10/11/2013 1032   VLDL 23 10/11/2013 1032   LDLCALC 117 (H) 01/27/2020 0952   LABVLDL 14 01/27/2020 0952   Lab Results  Component Value Date   HGBA1C 6.1 (H) 08/28/2018   HGBA1C 6.8 (H) 10/11/2013   No components found for: NTPROBNP Lab Results  Component Value Date   TSH 0.599 10/11/2013   TSH 0.401 07/02/2012    Cardiac Panel (last 3 results) No results for input(s): CKTOTAL, CKMB, TROPONINIHS, RELINDX in the last 72 hours.  FINAL MEDICATION LIST END OF ENCOUNTER: Meds ordered this encounter  Medications  . sacubitril-valsartan (ENTRESTO) 24-26 MG    Sig: Take 1 tablet by mouth 2 (two) times daily.    Dispense:  180 tablet    Refill:  0  . metoprolol succinate (TOPROL XL) 25 MG 24 hr tablet    Sig: Take 1 tablet (25 mg total) by mouth in the morning. Hold if systolic blood pressure (top blood pressure number) less than 100 mmHg or heart rate less than 60 bpm (pulse).    Dispense:  90 tablet    Refill:  0    Medications Discontinued During This Encounter  Medication Reason  . losartan (COZAAR) 50 MG tablet Change in therapy     Current Outpatient Medications:  .  aspirin 81 MG EC tablet, Take 81 mg by mouth every evening. Swallow whole. , Disp: , Rfl:  .  atorvastatin (LIPITOR) 10 MG tablet, Take 10  mg by mouth daily. Mid-Morning,  Disp: , Rfl:  .  b complex vitamins tablet, Take 1 tablet by mouth daily., Disp: , Rfl:  .  Calcium Carbonate (CALCIUM 600 PO), Take 600 mg by mouth daily., Disp: , Rfl:  .  cetirizine (ZYRTEC) 10 MG tablet, Take 10 mg by mouth daily., Disp: , Rfl:  .  Cholecalciferol (VITAMIN D3) 2000 units TABS, Take 2,000 Units by mouth daily., Disp: , Rfl:  .  dexlansoprazole (DEXILANT) 60 MG capsule, Take 60 mg by mouth daily., Disp: , Rfl:  .  GLUCOSAMINE-CHONDROITIN PO, Take 1 tablet by mouth every evening. , Disp: , Rfl:  .  metFORMIN (GLUCOPHAGE) 500 MG tablet, Take 500 mg by mouth daily at 12 noon. , Disp: , Rfl:  .  Multiple Vitamin (MULTIVITAMIN WITH MINERALS) TABS tablet, Take 1 tablet by mouth daily., Disp: , Rfl:  .  nitroGLYCERIN (NITROSTAT) 0.4 MG SL tablet, Place 1 tablet (0.4 mg total) under the tongue every 5 (five) minutes as needed for chest pain. If you require more than two tablets five minutes apart go to the nearest ER via EMS., Disp: 90 tablet, Rfl: 0 .  Polyethyl Glycol-Propyl Glycol (ULTRA LUBRICANT EYE DROPS OP), Place 1 drop into both eyes 3 (three) times daily as needed (for dry eyes). , Disp: , Rfl:  .  PROAIR HFA 108 (90 Base) MCG/ACT inhaler, Inhale 2 puffs into the lungs every 6 (six) hours as needed for wheezing or shortness of breath. , Disp: , Rfl:  .  sertraline (ZOLOFT) 50 MG tablet, Take 50 mg by mouth daily. , Disp: , Rfl:  .  solifenacin (VESICARE) 10 MG tablet, Take 10 mg by mouth daily. , Disp: , Rfl:  .  traZODone (DESYREL) 50 MG tablet, Take 50 mg by mouth at bedtime as needed for sleep. , Disp: , Rfl:  .  metoprolol succinate (TOPROL XL) 25 MG 24 hr tablet, Take 1 tablet (25 mg total) by mouth in the morning. Hold if systolic blood pressure (top blood pressure number) less than 100 mmHg or heart rate less than 60 bpm (pulse)., Disp: 90 tablet, Rfl: 0 .  sacubitril-valsartan (ENTRESTO) 24-26 MG, Take 1 tablet by mouth 2 (two) times daily., Disp: 180 tablet, Rfl:  0  IMPRESSION:    ICD-10-CM   1. NICM (nonischemic cardiomyopathy) (HCC)  I42.8 EKG [id number]    Basic metabolic panel    Magnesium    sacubitril-valsartan (ENTRESTO) 24-26 MG    metoprolol succinate (TOPROL XL) 25 MG 24 hr tablet  2. Essential hypertension  I10   3. LBBB (left bundle branch block)  I44.7   4. Non-insulin dependent type 2 diabetes mellitus ([place])  E11.9   5. Mixed hyperlipidemia  E78.2      RECOMMENDATIONS: TOSHIANA DEWING is a 69 y.o. female whose past medical history and cardiovascular risk factors include: Nonischemic cardiomyopathy, left bundle branch block, type 2 diabetes mellitus, hyperlipidemia, postmenopausal female, advanced age.  Nonischemic cardiomyopathy:  Discontinue losartan, per patient she is not on losartan even though prescribed as the pharmacy did not fill it for her.  Start Entresto 24/26 mg p.o. twice daily.  Start metoprolol succinate 25 mg p.o. every morning  Continue aspirin.  Continue statin therapy.  Blood work in 1 week after starting Jordan Willis to evaluate kidney function and electrolytes.  We will continue to uptitrate medical therapy as hemodynamics and laboratory values allow.  Benign essential hypertension: . Office blood pressure is currently not at goal. .  Start Entresto and metoprolol. . Medication reconciled.  . If the blood pressure is consistently greater than 179mmHg patient is asked to call the office or her primary care provider for medication titration sooner than the next office visit.  . Low salt diet recommended. A diet that is rich in fruits, vegetables, legumes, and low-fat dairy products and low in snacks, sweets, and meats (such as the Dietary Approaches to Stop Hypertension [DASH] diet).   Mixed hyperlipidemia: . Continue statin therapy.   . Patient denies myalgia or other side effects.  Non-insulin-dependent diabetes mellitus type 2: Currently managed by PCP.  No recent hemoglobin A1c for review.  --Continue  cardiac medications as reconciled in final medication list. --Return in about 4 weeks (around 03/10/2020) for heart failure management.. Or sooner if needed. --Continue follow-up with your primary care physician regarding the management of your other chronic comorbid conditions.  Patient's questions and concerns were addressed to her satisfaction. She voices understanding of the instructions provided during this encounter.   This note was created using a voice recognition software as a result there may be grammatical errors inadvertently enclosed that do not reflect the nature of this encounter. Every attempt is made to correct such errors.  Rex Kras, [place], [facility]  Pager: [phone] Office: [phone]

## 2020-02-18 DIAGNOSIS — E875 Hyperkalemia: Secondary | ICD-10-CM | POA: Diagnosis not present

## 2020-02-18 DIAGNOSIS — R748 Abnormal levels of other serum enzymes: Secondary | ICD-10-CM | POA: Diagnosis not present

## 2020-02-18 DIAGNOSIS — Z0001 Encounter for general adult medical examination with abnormal findings: Secondary | ICD-10-CM | POA: Diagnosis not present

## 2020-02-19 ENCOUNTER — Other Ambulatory Visit: Payer: Self-pay

## 2020-02-19 DIAGNOSIS — I428 Other cardiomyopathies: Secondary | ICD-10-CM

## 2020-02-19 DIAGNOSIS — I209 Angina pectoris, unspecified: Secondary | ICD-10-CM

## 2020-02-25 DIAGNOSIS — E1165 Type 2 diabetes mellitus with hyperglycemia: Secondary | ICD-10-CM | POA: Diagnosis not present

## 2020-02-25 DIAGNOSIS — E049 Nontoxic goiter, unspecified: Secondary | ICD-10-CM | POA: Diagnosis not present

## 2020-02-25 DIAGNOSIS — E059 Thyrotoxicosis, 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: Secondary | ICD-10-CM | POA: Diagnosis not present

## 2020-02-25 DIAGNOSIS — I10 Essential (primary) hypertension: Secondary | ICD-10-CM | POA: Diagnosis not present

## 2020-02-29 ENCOUNTER — Other Ambulatory Visit: Payer: Self-pay | Admitting: Endocrinology

## 2020-02-29 DIAGNOSIS — E049 Nontoxic goiter, unspecified: Secondary | ICD-10-CM

## 2020-03-07 ENCOUNTER — Other Ambulatory Visit: Payer: Self-pay | Admitting: Cardiology

## 2020-03-07 DIAGNOSIS — I428 Other cardiomyopathies: Secondary | ICD-10-CM

## 2020-03-15 ENCOUNTER — Ambulatory Visit: Payer: Medicare Other | Admitting: Cardiology

## 2020-03-15 ENCOUNTER — Encounter: Payer: Self-pay | Admitting: Cardiology

## 2020-03-15 ENCOUNTER — Other Ambulatory Visit: Payer: Self-pay

## 2020-03-15 VITALS — BP 147/61 | HR 80 | Ht 67.0 in | Wt 191.0 lb

## 2020-03-15 DIAGNOSIS — I10 Essential (primary) hypertension: Secondary | ICD-10-CM | POA: Diagnosis not present

## 2020-03-15 DIAGNOSIS — I428 Other cardiomyopathies: Secondary | ICD-10-CM | POA: Diagnosis not present

## 2020-03-15 DIAGNOSIS — E119 Type 2 diabetes mellitus without complications: Secondary | ICD-10-CM

## 2020-03-15 DIAGNOSIS — E782 Mixed hyperlipidemia: Secondary | ICD-10-CM | POA: Diagnosis not present

## 2020-03-15 DIAGNOSIS — I447 Left bundle-branch block, unspecified: Secondary | ICD-10-CM

## 2020-03-15 NOTE — Progress Notes (Signed)
Jordan Willis Date of Birth: October 23, 1950 MRN: [id number] Primary Care Provider:Pharr, Thayer Jew, MD  Former Cardiology Providers: Miquel Dunn, MSN, APRN, FNP-C Primary Cardiologist: Rex Kras, DO (established care 01/20/2020)  Date: 03/15/20 Last Office Visit: 02/04/2020  Chief Complaint  Patient presents with  . heart failure management  . Follow-up    4 week    HPI  Jordan Willis is a 69 y.o.  female who presents to the office with a chief complaint of " heart failure management." Patient's past medical history and cardiovascular risk factors include: Left bundle branch block, type 2 diabetes mellitus, hyperlipidemia, nonischemic cardiomyopathy, postmenopausal female, advanced age.  Patient is accompanied by her son at today's office visit.  She provides verbal consent in regards to discussing her cardiovascular care in the presence of her son.  Patient was recently seen back in March 2021 for evaluation of chest pain.  Prior to which she had gone to ER for further evaluation.  Given her symptoms and cardiovascular risk factors she was recommended to undergo an echo and stress test.  Mildly reduced left ventricular systolic function and stress test was concerning for reversible ischemia.  Patient underwent left heart catheterization and was found to have angiographically normal epicardial coronary arteries.  Given her mildly reduced left ventricular systolic function and heart failure symptoms her medications were uptitrated.    She now presents to the office for follow-up and states that she is doing well.  She denies any chest pain at rest or with effort related activities.  Her shortness of breath has improved significantly.  She is compliant with her medical therapy.  Since initiation of Entresto patient has not had any repeat blood work to evaluate kidney function and electrolytes.  Medications reconciled.    No family history of premature coronary artery disease  History  of nonischemic cardiomyopathy. Denies prior history of coronary artery disease, myocardial infarction, congestive heart failure, deep venous thrombosis, pulmonary embolism, stroke, transient ischemic attack.  FUNCTIONAL STATUS: No structured exercise program or daily routine.    ALLERGIES: Allergies  Allergen Reactions  . Aspirin Palpitations and Other (See Comments)    Heart fluttering   . Tape Rash   MEDICATION LIST PRIOR TO VISIT: Current Outpatient Medications on File Prior to Visit  Medication Sig Dispense Refill  . aspirin 81 MG EC tablet Take 81 mg by mouth every evening. Swallow whole.     Marland Kitchen atorvastatin (LIPITOR) 10 MG tablet Take 10 mg by mouth daily. Mid-Morning    . b complex vitamins tablet Take 1 tablet by mouth daily.    . Calcium Carbonate (CALCIUM 600 PO) Take 600 mg by mouth daily.    . cetirizine (ZYRTEC) 10 MG tablet Take 10 mg by mouth daily.    . Cholecalciferol (VITAMIN D3) 2000 units TABS Take 2,000 Units by mouth daily.    Marland Kitchen dexlansoprazole (DEXILANT) 60 MG capsule Take 60 mg by mouth daily.    Marland Kitchen ENTRESTO 24-26 MG TAKE 1 TABLET BY MOUTH 2 (TWO) TIMES DAILY. 180 tablet 0  . GLUCOSAMINE-CHONDROITIN PO Take 1 tablet by mouth every evening.     . metFORMIN (GLUCOPHAGE) 500 MG tablet Take 500 mg by mouth daily at 12 noon.     . metoprolol succinate (TOPROL XL) 25 MG 24 hr tablet Take 1 tablet (25 mg total) by mouth in the morning. Hold if systolic blood pressure (top blood pressure number) less than 100 mmHg or heart rate less than 60 bpm (pulse). [place]  tablet 0  . Multiple Vitamin (MULTIVITAMIN WITH MINERALS) TABS tablet Take 1 tablet by mouth daily.    . nitroGLYCERIN (NITROSTAT) 0.4 MG SL tablet Place 1 tablet (0.4 mg total) under the tongue every 5 (five) minutes as needed for chest pain. If you require more than two tablets five minutes apart go to the nearest ER via EMS. 90 tablet 0  . Polyethyl Glycol-Propyl Glycol (ULTRA LUBRICANT EYE DROPS OP) Place 1 drop into  both eyes 3 (three) times daily as needed (for dry eyes).     Marland Kitchen PROAIR HFA 108 (90 Base) MCG/ACT inhaler Inhale 2 puffs into the lungs every 6 (six) hours as needed for wheezing or shortness of breath.     . sertraline (ZOLOFT) 50 MG tablet Take 50 mg by mouth daily.     . solifenacin (VESICARE) 10 MG tablet Take 10 mg by mouth daily.     . traZODone (DESYREL) 50 MG tablet Take 50 mg by mouth at bedtime as needed for sleep.      No current facility-administered medications on file prior to visit.    PAST MEDICAL HISTORY: Past Medical History:  Diagnosis Date  . Anxiety   . Arthritis    Lumbar spine DDD.  Marland Kitchen Breast cancer, female, left 03/03/2012  . Cardiomyopathy ([place])   . Carpal tunnel syndrome    Bilateral, Mild  . Chronic sinusitis   . 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 Diabetes mellitus without complication ([place])    Diet controlled  . Dyspnea    When patient gets sick uses Pro-Air inhaler  . Frequent sinus infections   . GERD (gastroesophageal reflux disease)    occ  . Goiter   . History of cardiomegaly   . History of cataract    Bilateral  . History of kidney stones    08/28/2018 currently has a large kidney stone, asymptomatic at this time  . History of migraine   . History of uterine fibroid   . History of uterine prolapse   . Hyperlipidemia   . Hypertension   . Menopause 03/03/2012  . Nasal turbinate hypertrophy 11/29/2016   Left inferior   . Personal history of chemotherapy   . Personal history of radiation therapy   . PONV (postoperative nausea and vomiting)   . Sinusitis 2014  . Sleep apnea    No CPAP  . SVD (spontaneous vaginal delivery)    x 2  . Thyroid nodule     PAST SURGICAL HISTORY: Past Surgical History:  Procedure Laterality Date  . BILATERAL SALPINGOOPHORECTOMY Bilateral   . BLADDER SUSPENSION N/A 05/16/2017   Procedure: TRANSVAGINAL TAPE (TVT) PROCEDURE;  Surgeon: Everett Graff, MD;  Location: [facility];  Service:  Gynecology;  Laterality: N/A;  . BREAST BIOPSY Right 2009  . BREAST LUMPECTOMY Right 2009  . BREAST LUMPECTOMY Left 2006  . CARDIAC CATHETERIZATION    . CATARACT EXTRACTION Left 10/23/2017  . CATARACT EXTRACTION Right 11/06/2017  . COLONOSCOPY  10/22/2009   Normal.  Repeat 5 years.  Nat [name]  . CYSTOSCOPY N/A 05/16/2017   Procedure: CYSTOSCOPY;  Surgeon: Everett Graff, MD;  Location: [facility];  Service: Gynecology;  Laterality: N/A;  . LEFT HEART CATH AND CORONARY ANGIOGRAPHY N/A 02/09/2020   Procedure: LEFT HEART CATH AND CORONARY ANGIOGRAPHY;  Surgeon: Nigel Mormon, MD;  Location: [facility];  Service: Cardiovascular;  Laterality: N/A;  . ROBOTIC ASSISTED TOTAL HYSTERECTOMY N/A 05/16/2017   Procedure: ROBOTIC ASSISTED TOTAL  HYSTERECTOMY;  Surgeon: Delsa Bern, MD;  Location: [facility];  Service: Gynecology;  Laterality: N/A;  . ROTATOR CUFF REPAIR Bilateral   . SHOULDER ARTHROSCOPY WITH ROTATOR CUFF REPAIR AND SUBACROMIAL DECOMPRESSION Right 09/03/2018   Procedure: Right shoulder manipulation under anesthesia, exam under anesthesia, mini open rotator cuff repair, subacromial decompression;  Surgeon: Susa Day, MD;  Location: [facility];  Service: Orthopedics;  Laterality: Right;  90 mins  . TUBAL LIGATION    . WISDOM TOOTH EXTRACTION      FAMILY HISTORY: The patient's family history includes Breast cancer in her sister and sister; Cancer in her sister and sister; Cancer (age of onset: 37) in her mother; Diabetes in her father; Hypertension in her mother.   SOCIAL HISTORY:  The patient  reports that she has never smoked. She has never used smokeless tobacco. She reports that she does not drink alcohol or use drugs.  Review of Systems  Constitution: Negative for chills and fever.  HENT: Negative for ear discharge, ear pain and nosebleeds.   Eyes: Negative for blurred vision and discharge.  Cardiovascular: Negative for chest pain, claudication, dyspnea on exertion, leg  swelling, near-syncope, orthopnea, palpitations, paroxysmal nocturnal dyspnea and syncope.  Respiratory: Positive for shortness of breath. Negative for cough.   Endocrine: Negative for polydipsia, polyphagia and polyuria.  Hematologic/Lymphatic: Negative for bleeding problem.  Skin: Negative for flushing and nail changes.  Musculoskeletal: Negative for muscle cramps, muscle weakness and myalgias.  Gastrointestinal: Negative for abdominal pain, dysphagia, hematemesis, hematochezia, melena, nausea and vomiting.  Neurological: Negative for dizziness, focal weakness and light-headedness.  Psychiatric/Behavioral:       Grieving the loss of her sister.    PHYSICAL EXAM: Vitals with BMI 03/15/2020 02/11/2020 02/09/2020  Height 5\' 7"  5\' 7"  -  Weight 191 lbs 191 lbs 5 oz -  BMI [id number] -  Systolic [id number]  Diastolic 61 63 62  Pulse 80 75 74    CONSTITUTIONAL: Well-developed and well-nourished. No acute distress.  SKIN: Skin is warm and dry. No rash noted. No cyanosis. No pallor. No jaundice HEAD: Normocephalic and atraumatic.  EYES: No scleral icterus MOUTH/THROAT: Moist oral membranes.  NECK: No JVD present. No thyromegaly noted. No carotid bruits  LYMPHATIC: No visible cervical adenopathy.  CHEST Normal respiratory effort. No intercostal retractions  LUNGS: Clear to auscultation bilaterally.  No stridor. No wheezes. No rales.  CARDIOVASCULAR: Regular rate and rhythm, positive S1-S2, no murmurs rubs or gallops appreciated. ABDOMINAL: No apparent ascites.  EXTREMITIES: No peripheral edema  HEMATOLOGIC: No significant bruising NEUROLOGIC: Oriented to person, place, and time. Nonfocal. Normal muscle tone.  PSYCHIATRIC: Normal mood and affect. Normal behavior. Cooperative  CARDIAC DATABASE: EKG: 01/20/2020: Normal sinus rhythm with a ventricular rate of 78 bpm, normal axis deviation, left bundle branch block, poor R wave progression, ST-T changes most likely secondary to LBBB, cannot  entirely rule out anteroseptal infarct.  Echocardiogram: 02/13/2017 at [facility]: EF 60-65%, no regional wall motion abnormalities, mild MR.  02/01/2020: LVEF 40-45% with regional wall motion abnormalities, mild LVH,, grade 1 diastolic impairment, normal left atrial pressure, moderate MR, mild TR, RVSP 24 mmHg, moderate PR.  02/09/2020 limited echo with Definity: LVEF 40-45% with regional wall motion abnormalities, no left ventricular thrombus.  Stress Testing:  02/14/2018 exercise nuclear stress test at [facility]: Achieved 7 METS, normal blood pressure response to exercise, had exertional chest pain that improved/resolved during recovery, normal myocardial perfusion study no EKG abnormalities per report.  Heart Catheterization:  02/09/2020 by Dr. Joya Gaskins Patwardhan: Right dominant system, no angiographically significant coronary artery disease.  Normal LVEDP.  LABORATORY DATA: CBC Latest Ref Rng & Units 02/09/2020 01/05/2020 09/04/2018  WBC 4.0 - 10.5 K/uL 4.8 5.8 12.0(H)  Hemoglobin 12.0 - 15.0 g/dL 13.1 12.5 12.9  Hematocrit 36.0 - 46.0 % 41.6 39.4 41.3  Platelets 150 - 400 K/uL 282 272 259    CMP Latest Ref Rng & Units 01/27/2020 01/05/2020 09/04/2018  Glucose 65 - 99 mg/dL 122(H) 96 149(H)  BUN 8 - 27 mg/dL 14 9 16   Creatinine 0.57 - 1.00 mg/dL 0.64 0.57 0.74  Sodium 134 - 144 mmol/L 143 139 136  Potassium 3.5 - 5.2 mmol/L 4.5 3.5 4.3  Chloride 96 - 106 mmol/L 104 103 103  CO2 20 - 29 mmol/L 22 26 22   Calcium 8.7 - 10.3 mg/dL 10.0 9.5 9.3  Total Protein 6.4 - 8.3 g/dL - - -  Total Bilirubin 0.20 - 1.20 mg/dL - - -  Alkaline Phos 40 - 150 U/L - - -  AST 5 - 34 U/L - - -  ALT 0 - 55 U/L - - -    Lipid Panel: Outside records 06/24/2018: Total cholesterol 176, triglycerides 104, HDL 52, LDL 103  Lipid Panel     Component Value Date/Time   CHOL 175 01/27/2020 0952   TRIG 74 01/27/2020 0952   HDL 44 01/27/2020 0952   CHOLHDL 6.2 10/11/2013 1032   VLDL 23  10/11/2013 1032   LDLCALC 117 (H) 01/27/2020 0952   LABVLDL 14 01/27/2020 0952   Lab Results  Component Value Date   HGBA1C 6.1 (H) 08/28/2018   HGBA1C 6.8 (H) 10/11/2013   No components found for: NTPROBNP Lab Results  Component Value Date   TSH 0.599 10/11/2013   TSH 0.401 07/02/2012    Cardiac Panel (last 3 results) No results for input(s): CKTOTAL, CKMB, TROPONINIHS, RELINDX in the last 72 hours.  FINAL MEDICATION LIST END OF ENCOUNTER: No orders of the defined types were placed in this encounter.   There are no discontinued medications.   Current Outpatient Medications:  .  aspirin 81 MG EC tablet, Take 81 mg by mouth every evening. Swallow whole. , Disp: , Rfl:  .  atorvastatin (LIPITOR) 10 MG tablet, Take 10 mg by mouth daily. Mid-Morning, Disp: , Rfl:  .  b complex vitamins tablet, Take 1 tablet by mouth daily., Disp: , Rfl:  .  Calcium Carbonate (CALCIUM 600 PO), Take 600 mg by mouth daily., Disp: , Rfl:  .  cetirizine (ZYRTEC) 10 MG tablet, Take 10 mg by mouth daily., Disp: , Rfl:  .  Cholecalciferol (VITAMIN D3) 2000 units TABS, Take 2,000 Units by mouth daily., Disp: , Rfl:  .  dexlansoprazole (DEXILANT) 60 MG capsule, Take 60 mg by mouth daily., Disp: , Rfl:  .  ENTRESTO 24-26 MG, TAKE 1 TABLET BY MOUTH 2 (TWO) TIMES DAILY., Disp: 180 tablet, Rfl: 0 .  GLUCOSAMINE-CHONDROITIN PO, Take 1 tablet by mouth every evening. , Disp: , Rfl:  .  metFORMIN (GLUCOPHAGE) 500 MG tablet, Take 500 mg by mouth daily at 12 noon. , Disp: , Rfl:  .  metoprolol succinate (TOPROL XL) 25 MG 24 hr tablet, Take 1 tablet (25 mg total) by mouth in the morning. Hold if systolic blood pressure (top blood pressure number) less than 100 mmHg or heart rate less than 60 bpm (pulse)., Disp: 90 tablet, Rfl: 0 .  Multiple Vitamin (MULTIVITAMIN WITH MINERALS) TABS tablet,  Take 1 tablet by mouth daily., Disp: , Rfl:  .  nitroGLYCERIN (NITROSTAT) 0.4 MG SL tablet, Place 1 tablet (0.4 mg total) under the  tongue every 5 (five) minutes as needed for chest pain. If you require more than two tablets five minutes apart go to the nearest ER via EMS., Disp: 90 tablet, Rfl: 0 .  Polyethyl Glycol-Propyl Glycol (ULTRA LUBRICANT EYE DROPS OP), Place 1 drop into both eyes 3 (three) times daily as needed (for dry eyes). , Disp: , Rfl:  .  PROAIR HFA 108 (90 Base) MCG/ACT inhaler, Inhale 2 puffs into the lungs every 6 (six) hours as needed for wheezing or shortness of breath. , Disp: , Rfl:  .  sertraline (ZOLOFT) 50 MG tablet, Take 50 mg by mouth daily. , Disp: , Rfl:  .  solifenacin (VESICARE) 10 MG tablet, Take 10 mg by mouth daily. , Disp: , Rfl:  .  traZODone (DESYREL) 50 MG tablet, Take 50 mg by mouth at bedtime as needed for sleep. , Disp: , Rfl:   IMPRESSION:    ICD-10-CM   1. NICM (nonischemic cardiomyopathy) ([place])  99991111 Basic Metabolic Panel (BMET)    Magnesium    Pro b natriuretic peptide (BNP)[facility])  2. Essential hypertension  I10   3. LBBB (left bundle branch block)  I44.7   4. Non-insulin dependent type 2 diabetes mellitus ([place])  E11.9   5. Mixed hyperlipidemia  E78.2      RECOMMENDATIONS: Jordan Willis is a 69 y.o. female whose past medical history and cardiovascular risk factors include: Nonischemic cardiomyopathy, left bundle branch block, type 2 diabetes mellitus, hyperlipidemia, postmenopausal female, advanced age.  Nonischemic cardiomyopathy:  Continue Entresto 24/26 mg p.o. twice daily.  Continue metoprolol succinate 25 mg p.o. every morning  Continue aspirin.  Continue statin therapy.  Repeat blood work today, if within normal limits will uptitrate Entresto.   We will continue to uptitrate medical therapy as hemodynamics and laboratory values allow.  Benign essential hypertension: . Office blood pressure is currently not at goal. . Continue Entresto and metoprolol. . Medication reconciled.  . If the blood pressure is consistently greater  than 153mmHg patient is asked to call the office or her primary care provider for medication titration sooner than the next office visit.  . Low salt diet recommended. A diet that is rich in fruits, vegetables, legumes, and low-fat dairy products and low in snacks, sweets, and meats (such as the Dietary Approaches to Stop Hypertension [DASH] diet).   Mixed hyperlipidemia: . Continue statin therapy.   . Patient denies myalgia or other side effects.  Non-insulin-dependent diabetes mellitus type 2: Currently managed by PCP.  No recent hemoglobin A1c for review.  --Continue cardiac medications as reconciled in final medication list. --Return in about 4 weeks (around 04/12/2020) for heart failure management.. Or sooner if needed. --Continue follow-up with your primary care physician regarding the management of your other chronic comorbid conditions.  Patient's questions and concerns were addressed to her satisfaction. She voices understanding of the instructions provided during this encounter.   This note was created using a voice recognition software as a result there may be grammatical errors inadvertently enclosed that do not reflect the nature of this encounter. Every attempt is made to correct such errors.  Rex Kras, [place], [facility]  Pager: [phone] Office: [phone]

## 2020-03-16 ENCOUNTER — Other Ambulatory Visit: Payer: Self-pay

## 2020-03-16 ENCOUNTER — Other Ambulatory Visit: Payer: Self-pay | Admitting: Cardiology

## 2020-03-16 DIAGNOSIS — I428 Other cardiomyopathies: Secondary | ICD-10-CM

## 2020-03-16 LAB — BASIC METABOLIC PANEL
BUN/Creatinine Ratio: 15 (ref 12–28)
BUN: 9 mg/dL (ref 8–27)
CO2: 23 mmol/L (ref 20–29)
Calcium: 10.2 mg/dL (ref 8.7–10.3)
Chloride: 103 mmol/L (ref 96–106)
Creatinine, Ser: 0.59 mg/dL (ref 0.57–1.00)
GFR calc Af Amer: 109 mL/min/{1.73_m2} (ref >59)
GFR calc non Af Amer: 94 mL/min/{1.73_m2} (ref >59)
Glucose: 115 mg/dL — ABNORMAL HIGH (ref 65–99)
Potassium: 4.6 mmol/L (ref 3.5–5.2)
Sodium: 142 mmol/L (ref 134–144)

## 2020-03-16 LAB — PRO B NATRIURETIC PEPTIDE: NT-Pro BNP: 84 pg/mL (ref 0–301)

## 2020-03-16 LAB — MAGNESIUM: Magnesium: 2.1 mg/dL (ref 1.6–2.3)

## 2020-03-16 MED ORDER — ENTRESTO 49-51 MG PO TABS: 1.0000 | ORAL_TABLET | Freq: Two times a day (BID) | ORAL | 0 refills | Status: DC

## 2020-03-16 NOTE — Progress Notes (Signed)
Results reviewed. Serum potassium and magnesium levels are within normal limits. Kidney function is relatively at baseline.  Currently patient is taking Entresto 24/26 mg p.o. twice daily.  Would like her to increase her Entresto dose.  Please inform the patient to take Entresto 24/26 mg 2 tablets twice a day until she finishes her current med supply.   Once she runs out the new script will be Entresto 49/51 mg p.o. twice daily.   Since uptitrating Entresto repeat blood work in 1 week, around March 24, 2020.  Rex Kras, [place], [facility]  Pager: [phone] Office: [phone]

## 2020-03-20 ENCOUNTER — Other Ambulatory Visit: Payer: Self-pay | Admitting: Cardiology

## 2020-03-24 DIAGNOSIS — I428 Other cardiomyopathies: Secondary | ICD-10-CM | POA: Diagnosis not present

## 2020-03-25 LAB — BASIC METABOLIC PANEL
BUN/Creatinine Ratio: 24 (ref 12–28)
BUN: 14 mg/dL (ref 8–27)
CO2: 24 mmol/L (ref 20–29)
Calcium: 10.4 mg/dL — ABNORMAL HIGH (ref 8.7–10.3)
Chloride: 101 mmol/L (ref 96–106)
Creatinine, Ser: 0.59 mg/dL (ref 0.57–1.00)
GFR calc Af Amer: 109 mL/min/{1.73_m2} (ref >59)
GFR calc non Af Amer: 94 mL/min/{1.73_m2} (ref >59)
Glucose: 110 mg/dL — ABNORMAL HIGH (ref 65–99)
Potassium: 4.4 mmol/L (ref 3.5–5.2)
Sodium: 139 mmol/L (ref 134–144)

## 2020-03-25 LAB — PRO B NATRIURETIC PEPTIDE: NT-Pro BNP: 52 pg/mL (ref 0–301)

## 2020-03-25 LAB — MAGNESIUM: Magnesium: 2.4 mg/dL — ABNORMAL HIGH (ref 1.6–2.3)

## 2020-04-08 DIAGNOSIS — I428 Other cardiomyopathies: Secondary | ICD-10-CM | POA: Diagnosis not present

## 2020-04-08 DIAGNOSIS — I209 Angina pectoris, unspecified: Secondary | ICD-10-CM | POA: Diagnosis not present

## 2020-04-09 LAB — LIPID PANEL WITH LDL/HDL RATIO
Cholesterol, Total: 191 mg/dL (ref 100–199)
HDL: 49 mg/dL (ref >39)
LDL Chol Calc (NIH): 118 mg/dL — ABNORMAL HIGH (ref 0–99)
LDL/HDL Ratio: 2.4 ratio (ref 0.0–3.2)
Triglycerides: 132 mg/dL (ref 0–149)
VLDL Cholesterol Cal: 24 mg/dL (ref 5–40)

## 2020-04-09 LAB — BASIC METABOLIC PANEL
BUN/Creatinine Ratio: 18 (ref 12–28)
BUN: 11 mg/dL (ref 8–27)
CO2: 25 mmol/L (ref 20–29)
Calcium: 10.3 mg/dL (ref 8.7–10.3)
Chloride: 105 mmol/L (ref 96–106)
Creatinine, Ser: 0.62 mg/dL (ref 0.57–1.00)
GFR calc Af Amer: 107 mL/min/{1.73_m2} (ref >59)
GFR calc non Af Amer: 93 mL/min/{1.73_m2} (ref >59)
Glucose: 82 mg/dL (ref 65–99)
Potassium: 4.4 mmol/L (ref 3.5–5.2)
Sodium: 143 mmol/L (ref 134–144)

## 2020-04-09 LAB — MAGNESIUM: Magnesium: 2.2 mg/dL (ref 1.6–2.3)

## 2020-04-09 LAB — PRO B NATRIURETIC PEPTIDE: NT-Pro BNP: 121 pg/mL (ref 0–301)

## 2020-04-12 ENCOUNTER — Telehealth: Payer: Self-pay

## 2020-04-12 NOTE — Telephone Encounter (Signed)
Pt called to inform us that she feels something thumping hard on her rt side near her ribs that makes her stomach move. Pt wants to know if its a side affect form a medication. Pt mention it been going on for about 30 min now. Please advise.

## 2020-04-13 ENCOUNTER — Encounter: Payer: Self-pay | Admitting: Cardiology

## 2020-04-13 ENCOUNTER — Other Ambulatory Visit: Payer: Self-pay

## 2020-04-13 ENCOUNTER — Ambulatory Visit: Payer: Medicare Other | Admitting: Cardiology

## 2020-04-13 VITALS — BP 130/72 | HR 90 | Resp 17 | Ht 67.0 in | Wt 195.0 lb

## 2020-04-13 DIAGNOSIS — I10 Essential (primary) hypertension: Secondary | ICD-10-CM

## 2020-04-13 DIAGNOSIS — I428 Other cardiomyopathies: Secondary | ICD-10-CM

## 2020-04-13 DIAGNOSIS — E782 Mixed hyperlipidemia: Secondary | ICD-10-CM

## 2020-04-13 DIAGNOSIS — I447 Left bundle-branch block, unspecified: Secondary | ICD-10-CM | POA: Diagnosis not present

## 2020-04-13 DIAGNOSIS — E119 Type 2 diabetes mellitus without complications: Secondary | ICD-10-CM

## 2020-04-13 MED ORDER — ATORVASTATIN CALCIUM 20 MG PO TABS: 20.0000 mg | ORAL_TABLET | Freq: Every day | ORAL | 1 refills | Status: DC

## 2020-04-13 MED ORDER — ENTRESTO 49-51 MG PO TABS: 1.0000 | ORAL_TABLET | Freq: Two times a day (BID) | ORAL | 0 refills | Status: DC

## 2020-04-13 NOTE — Patient Instructions (Addendum)
Fast blood work 10/05/2020

## 2020-04-13 NOTE — Progress Notes (Signed)
Jordan Willis Date of Birth: 1951-09-11 MRN: [id number] Primary Care Provider:Pharr, Thayer Jew, MD  Former Cardiology Providers: Miquel Dunn, MSN, APRN, FNP-C Primary Cardiologist: Rex Kras, DO (established care 01/20/2020)  Date: 04/13/20 Last Office Visit: 02/04/2020  Chief Complaint  Patient presents with  . Congestive Heart Failure  . Follow-up    4 week    HPI  Jordan Willis is a 69 y.o.  female who presents to the office with a chief complaint of " heart failure management." Patient's past medical history and cardiovascular risk factors include: Left bundle branch block, type 2 diabetes mellitus, hyperlipidemia, nonischemic cardiomyopathy, postmenopausal female, advanced age.  Patient is accompanied by her son at today's office visit.  She provides verbal consent in regards to discussing her cardiovascular care in the presence of her son.  Patient was recently seen back in March 2021 for evaluation of chest pain.  Prior to which she had gone to ER for further evaluation.  Given her symptoms and cardiovascular risk factors she was recommended to undergo an echo and stress test.  Mildly reduced left ventricular systolic function and stress test was concerning for reversible ischemia.  Patient underwent left heart catheterization and was found to have angiographically normal epicardial coronary arteries.  Given her mildly reduced left ventricular systolic function and heart failure symptoms her medications were uptitrated.    Since last office visit patient has not had any chest pain or heart failure exacerbation symptoms.  She is compliant with her medical therapy and has tolerated the higher dose of Entresto at 49/51 mg p.o. twice daily.  Repeat blood work reviewed with her and her son at today's office visit.  Most recent lipid profile also reviewed with the patient and her son at today's visit.  No family history of premature coronary artery disease  History of  nonischemic cardiomyopathy. Denies prior history of coronary artery disease, myocardial infarction, congestive heart failure, deep venous thrombosis, pulmonary embolism, stroke, transient ischemic attack.  FUNCTIONAL STATUS: Walking atleast 47min a day 5 days a week.    ALLERGIES: Allergies  Allergen Reactions  . Aspirin Palpitations and Other (See Comments)    Heart fluttering   . Tape Rash   MEDICATION LIST PRIOR TO VISIT: Current Outpatient Medications on File Prior to Visit  Medication Sig Dispense Refill  . acetaminophen (TYLENOL) 325 MG tablet Take 650 mg by mouth every 6 (six) hours as needed.    Marland Kitchen aspirin 81 MG EC tablet Take 81 mg by mouth every evening. Swallow whole.     . cetirizine (ZYRTEC) 10 MG tablet Take 10 mg by mouth daily.    . Cholecalciferol (VITAMIN D3) 2000 units TABS Take 2,000 Units by mouth daily.    Marland Kitchen dexlansoprazole (DEXILANT) 60 MG capsule Take 60 mg by mouth daily as needed.     . Echinacea 500 MG CAPS Take 1 capsule by mouth daily.    . Garlic 8250 MG TBEC Take 1 tablet by mouth daily.    Marland Kitchen GLUCOSAMINE-CHONDROITIN PO Take 1 tablet by mouth every evening.     Marland Kitchen L-Lysine 1000 MG TABS Take 1 tablet by mouth daily.    . metFORMIN (GLUCOPHAGE) 500 MG tablet Take 500 mg by mouth daily at 12 noon.     . metoprolol succinate (TOPROL XL) 25 MG 24 hr tablet Take 1 tablet (25 mg total) by mouth in the morning. Hold if systolic blood pressure (top blood pressure number) less than 100 mmHg or heart rate less  than 60 bpm (pulse). 90 tablet 0  . Multiple Vitamin (MULTIVITAMIN WITH MINERALS) TABS tablet Take 1 tablet by mouth daily.    . multivitamin-lutein (OCUVITE-LUTEIN) CAPS capsule Take 1 capsule by mouth daily.    . nitroGLYCERIN (NITROSTAT) 0.4 MG SL tablet Place 1 tablet (0.4 mg total) under the tongue every 5 (five) minutes as needed for chest pain. If you require more than two tablets five minutes apart go to the nearest ER via EMS. 90 tablet 0  . Polyethyl  Glycol-Propyl Glycol (ULTRA LUBRICANT EYE DROPS OP) Place 1 drop into both eyes 3 (three) times daily as needed (for dry eyes).     Marland Kitchen PROAIR HFA 108 (90 Base) MCG/ACT inhaler Inhale 2 puffs into the lungs every 6 (six) hours as needed for wheezing or shortness of breath.     . sertraline (ZOLOFT) 50 MG tablet Take 50 mg by mouth daily.     . solifenacin (VESICARE) 10 MG tablet Take 10 mg by mouth daily.     . traZODone (DESYREL) 50 MG tablet Take 50 mg by mouth at bedtime as needed for sleep.      No current facility-administered medications on file prior to visit.    PAST MEDICAL HISTORY: Past Medical History:  Diagnosis Date  . Anxiety   . Arthritis    Lumbar spine DDD.  Marland Kitchen Breast cancer, female, left 03/03/2012  . Cardiomyopathy ([place])   . Carpal tunnel syndrome    Bilateral, Mild  . Chronic sinusitis   . 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 Diabetes mellitus without complication ([place])    Diet controlled  . Dyspnea    When patient gets sick uses Pro-Air inhaler  . Frequent sinus infections   . GERD (gastroesophageal reflux disease)    occ  . Goiter   . History of cardiomegaly   . History of cataract    Bilateral  . History of kidney stones    08/28/2018 currently has a large kidney stone, asymptomatic at this time  . History of migraine   . History of uterine fibroid   . History of uterine prolapse   . Hyperlipidemia   . Hypertension   . Menopause 03/03/2012  . Nasal turbinate hypertrophy 11/29/2016   Left inferior   . Personal history of chemotherapy   . Personal history of radiation therapy   . PONV (postoperative nausea and vomiting)   . Sinusitis 2014  . Sleep apnea    No CPAP  . SVD (spontaneous vaginal delivery)    x 2  . Thyroid nodule     PAST SURGICAL HISTORY: Past Surgical History:  Procedure Laterality Date  . BILATERAL SALPINGOOPHORECTOMY Bilateral   . BLADDER SUSPENSION N/A 05/16/2017   Procedure: TRANSVAGINAL TAPE (TVT)  PROCEDURE;  Surgeon: Everett Graff, MD;  Location: [facility];  Service: Gynecology;  Laterality: N/A;  . BREAST BIOPSY Right 2009  . BREAST LUMPECTOMY Right 2009  . BREAST LUMPECTOMY Left 2006  . CARDIAC CATHETERIZATION    . CATARACT EXTRACTION Left 10/23/2017  . CATARACT EXTRACTION Right 11/06/2017  . COLONOSCOPY  10/22/2009   Normal.  Repeat 5 years.  Nat [name]  . CYSTOSCOPY N/A 05/16/2017   Procedure: CYSTOSCOPY;  Surgeon: Everett Graff, MD;  Location: [facility];  Service: Gynecology;  Laterality: N/A;  . LEFT HEART CATH AND CORONARY ANGIOGRAPHY N/A 02/09/2020   Procedure: LEFT HEART CATH AND CORONARY ANGIOGRAPHY;  Surgeon: Nigel Mormon, MD;  Location: [facility];  Service: Cardiovascular;  Laterality: N/A;  . ROBOTIC ASSISTED TOTAL HYSTERECTOMY N/A 05/16/2017   Procedure: ROBOTIC ASSISTED TOTAL HYSTERECTOMY;  Surgeon: Delsa Bern, MD;  Location: [facility];  Service: Gynecology;  Laterality: N/A;  . ROTATOR CUFF REPAIR Bilateral   . SHOULDER ARTHROSCOPY WITH ROTATOR CUFF REPAIR AND SUBACROMIAL DECOMPRESSION Right 09/03/2018   Procedure: Right shoulder manipulation under anesthesia, exam under anesthesia, mini open rotator cuff repair, subacromial decompression;  Surgeon: Susa Day, MD;  Location: [facility];  Service: Orthopedics;  Laterality: Right;  90 mins  . TUBAL LIGATION    . WISDOM TOOTH EXTRACTION      FAMILY HISTORY: The patient's family history includes Breast cancer in her sister and sister; Cancer in her sister and sister; Cancer (age of onset: 55) in her mother; Diabetes in her father; Hypertension in her mother.   SOCIAL HISTORY:  The patient  reports that she has never smoked. She has never used smokeless tobacco. She reports that she does not drink alcohol and does not use drugs.  Review of Systems  Constitutional: Negative for chills and fever.  HENT: Negative for ear discharge, ear pain and nosebleeds.   Eyes: Negative for blurred vision and discharge.   Cardiovascular: Negative for chest pain, claudication, dyspnea on exertion, leg swelling, near-syncope, orthopnea, palpitations, paroxysmal nocturnal dyspnea and syncope.  Respiratory: Negative for cough and shortness of breath.   Endocrine: Negative for polydipsia, polyphagia and polyuria.  Hematologic/Lymphatic: Negative for bleeding problem.  Skin: Negative for flushing and nail changes.  Musculoskeletal: Negative for muscle cramps, muscle weakness and myalgias.  Gastrointestinal: Negative for abdominal pain, dysphagia, hematemesis, hematochezia, melena, nausea and vomiting.  Neurological: Negative for dizziness, focal weakness and light-headedness.  Psychiatric/Behavioral:       Grieving the loss of her sister.    PHYSICAL EXAM: Vitals with BMI 04/13/2020 03/15/2020 02/11/2020  Height 5\' 7"  5\' 7"  5\' 7"   Weight 195 lbs 191 lbs 191 lbs 5 oz  BMI 30.53 96.04 54.09  Systolic 811 914 782  Diastolic 72 61 63  Pulse 90 80 75    CONSTITUTIONAL: Well-developed and well-nourished. No acute distress.  SKIN: Skin is warm and dry. No rash noted. No cyanosis. No pallor. No jaundice HEAD: Normocephalic and atraumatic.  EYES: No scleral icterus MOUTH/THROAT: Moist oral membranes.  NECK: No JVD present. No thyromegaly noted. No carotid bruits  LYMPHATIC: No visible cervical adenopathy.  CHEST Normal respiratory effort. No intercostal retractions  LUNGS: Clear to auscultation bilaterally.  No stridor. No wheezes. No rales.  CARDIOVASCULAR: Regular rate and rhythm, positive S1-S2, no murmurs rubs or gallops appreciated. ABDOMINAL: No apparent ascites.  EXTREMITIES: No peripheral edema  HEMATOLOGIC: No significant bruising NEUROLOGIC: Oriented to person, place, and time. Nonfocal. Normal muscle tone.  PSYCHIATRIC: Normal mood and affect. Normal behavior. Cooperative  CARDIAC DATABASE: EKG: 01/20/2020: Normal sinus rhythm with a ventricular rate of 78 bpm, normal axis deviation, left bundle  branch block, poor R wave progression, ST-T changes most likely secondary to LBBB, cannot entirely rule out anteroseptal infarct.  Echocardiogram: 02/13/2017 at [facility] EF 60-65%, no regional wall motion abnormalities, mild MR.  02/01/2020: LVEF 40-45% with regional wall motion abnormalities, mild LVH,, grade 1 diastolic impairment, normal left atrial pressure, moderate MR, mild TR, RVSP 24 mmHg, moderate PR.  02/09/2020 limited echo with Definity: LVEF 40-45% with regional wall motion abnormalities, no left ventricular thrombus.  Stress Testing:  02/14/2018 exercise nuclear stress test at [facility]: Achieved 7 METS, normal blood pressure response  to exercise, had exertional chest pain that improved/resolved during recovery, normal myocardial perfusion study no EKG abnormalities per report.  Heart Catheterization: 02/09/2020 by Dr. Joya Gaskins Patwardhan: Right dominant system, no angiographically significant coronary artery disease.  Normal LVEDP.  LABORATORY DATA: CBC Latest Ref Rng & Units 02/09/2020 01/05/2020 09/04/2018  WBC 4.0 - 10.5 K/uL 4.8 5.8 12.0(H)  Hemoglobin 12.0 - 15.0 g/dL 13.1 12.5 12.9  Hematocrit 36 - 46 % 41.6 39.4 41.3  Platelets 150 - 400 K/uL 282 272 259    CMP Latest Ref Rng & Units 04/08/2020 03/24/2020 03/15/2020  Glucose 65 - 99 mg/dL 82 110(H) 115(H)  BUN 8 - 27 mg/dL 11 14 9   Creatinine 0.57 - 1.00 mg/dL 0.62 0.59 0.59  Sodium 134 - 144 mmol/L 143 139 142  Potassium 3.5 - 5.2 mmol/L 4.4 4.4 4.6  Chloride 96 - 106 mmol/L 105 101 103  CO2 20 - 29 mmol/L 25 24 23   Calcium 8.7 - 10.3 mg/dL 10.3 10.4(H) 10.2  Total Protein 6.4 - 8.3 g/dL - - -  Total Bilirubin 0.20 - 1.20 mg/dL - - -  Alkaline Phos 40 - 150 U/L - - -  AST 5 - 34 U/L - - -  ALT 0 - 55 U/L - - -    Lipid Panel: Outside records 06/24/2018: Total cholesterol 176, triglycerides 104, HDL 52, LDL 103  Lipid Panel     Component Value Date/Time   CHOL 191 04/08/2020 1158   TRIG  132 04/08/2020 1158   HDL 49 04/08/2020 1158   CHOLHDL 6.2 10/11/2013 1032   VLDL 23 10/11/2013 1032   LDLCALC 118 (H) 04/08/2020 1158   LABVLDL 24 04/08/2020 1158   Lab Results  Component Value Date   HGBA1C 6.1 (H) 08/28/2018   HGBA1C 6.8 (H) 10/11/2013   No components found for: NTPROBNP Lab Results  Component Value Date   TSH 0.599 10/11/2013   TSH 0.401 07/02/2012    Cardiac Panel (last 3 results) No results for input(s): CKTOTAL, CKMB, TROPONINIHS, RELINDX in the last 72 hours.  FINAL MEDICATION LIST END OF ENCOUNTER: Meds ordered this encounter  Medications  . sacubitril-valsartan (ENTRESTO) 49-51 MG    Sig: Take 1 tablet by mouth 2 (two) times daily.    Dispense:  180 tablet    Refill:  0  . atorvastatin (LIPITOR) 20 MG tablet    Sig: Take 1 tablet (20 mg total) by mouth at bedtime.    Dispense:  90 tablet    Refill:  1    Medications Discontinued During This Encounter  Medication Reason  . sacubitril-valsartan (ENTRESTO) 24-26 MG Dose change  . b complex vitamins tablet Patient Preference  . Calcium Carbonate (CALCIUM 600 PO) Patient Preference  . atorvastatin (LIPITOR) 10 MG tablet Dose change     Current Outpatient Medications:  .  acetaminophen (TYLENOL) 325 MG tablet, Take 650 mg by mouth every 6 (six) hours as needed., Disp: , Rfl:  .  aspirin 81 MG EC tablet, Take 81 mg by mouth every evening. Swallow whole. , Disp: , Rfl:  .  cetirizine (ZYRTEC) 10 MG tablet, Take 10 mg by mouth daily., Disp: , Rfl:  .  Cholecalciferol (VITAMIN D3) 2000 units TABS, Take 2,000 Units by mouth daily., Disp: , Rfl:  .  dexlansoprazole (DEXILANT) 60 MG capsule, Take 60 mg by mouth daily as needed. , Disp: , Rfl:  .  Echinacea 500 MG CAPS, Take 1 capsule by mouth daily., Disp: , Rfl:  .  Garlic 3785 MG TBEC, Take 1 tablet by mouth daily., Disp: , Rfl:  .  GLUCOSAMINE-CHONDROITIN PO, Take 1 tablet by mouth every evening. , Disp: , Rfl:  .  L-Lysine 1000 MG TABS, Take 1  tablet by mouth daily., Disp: , Rfl:  .  metFORMIN (GLUCOPHAGE) 500 MG tablet, Take 500 mg by mouth daily at 12 noon. , Disp: , Rfl:  .  metoprolol succinate (TOPROL XL) 25 MG 24 hr tablet, Take 1 tablet (25 mg total) by mouth in the morning. Hold if systolic blood pressure (top blood pressure number) less than 100 mmHg or heart rate less than 60 bpm (pulse)., Disp: 90 tablet, Rfl: 0 .  Multiple Vitamin (MULTIVITAMIN WITH MINERALS) TABS tablet, Take 1 tablet by mouth daily., Disp: , Rfl:  .  multivitamin-lutein (OCUVITE-LUTEIN) CAPS capsule, Take 1 capsule by mouth daily., Disp: , Rfl:  .  nitroGLYCERIN (NITROSTAT) 0.4 MG SL tablet, Place 1 tablet (0.4 mg total) under the tongue every 5 (five) minutes as needed for chest pain. If you require more than two tablets five minutes apart go to the nearest ER via EMS., Disp: 90 tablet, Rfl: 0 .  Polyethyl Glycol-Propyl Glycol (ULTRA LUBRICANT EYE DROPS OP), Place 1 drop into both eyes 3 (three) times daily as needed (for dry eyes). , Disp: , Rfl:  .  PROAIR HFA 108 (90 Base) MCG/ACT inhaler, Inhale 2 puffs into the lungs every 6 (six) hours as needed for wheezing or shortness of breath. , Disp: , Rfl:  .  sertraline (ZOLOFT) 50 MG tablet, Take 50 mg by mouth daily. , Disp: , Rfl:  .  solifenacin (VESICARE) 10 MG tablet, Take 10 mg by mouth daily. , Disp: , Rfl:  .  traZODone (DESYREL) 50 MG tablet, Take 50 mg by mouth at bedtime as needed for sleep. , Disp: , Rfl:  .  atorvastatin (LIPITOR) 20 MG tablet, Take 1 tablet (20 mg total) by mouth at bedtime., Disp: 90 tablet, Rfl: 1 .  sacubitril-valsartan (ENTRESTO) 49-51 MG, Take 1 tablet by mouth 2 (two) times daily., Disp: 180 tablet, Rfl: 0  IMPRESSION:    ICD-10-CM   1. NICM (nonischemic cardiomyopathy) (HCC)  I42.8 sacubitril-valsartan (ENTRESTO) 49-51 MG    Pro b natriuretic peptide (BNP)[facility])    Basic metabolic panel    Magnesium  2. Essential hypertension  I10  sacubitril-valsartan (ENTRESTO) 49-51 MG  3. LBBB (left bundle branch block)  I44.7   4. Non-insulin dependent type 2 diabetes mellitus ([place])  E11.9   5. Mixed hyperlipidemia  E78.2 atorvastatin (LIPITOR) 20 MG tablet    Lipid Panel With LDL/HDL Ratio     RECOMMENDATIONS: Jordan Willis is a 69 y.o. female whose past medical history and cardiovascular risk factors include: Nonischemic cardiomyopathy, left bundle branch block, type 2 diabetes mellitus, hyperlipidemia, postmenopausal female, advanced age.  Nonischemic cardiomyopathy:  Continue Entresto 49/51 mg p.o. twice daily.  Continue metoprolol succinate 25 mg p.o. every morning  Continue aspirin.  Continue statin therapy.  We will continue to uptitrate medical therapy as hemodynamics and laboratory values allow.  Benign essential hypertension: . Office blood pressure is currently at goal. . Continue Entresto and metoprolol. . Medication reconciled.  . Low salt diet recommended. A diet that is rich in fruits, vegetables, legumes, and low-fat dairy products and low in snacks, sweets, and meats (such as the Dietary Approaches to Stop Hypertension [DASH] diet).   Mixed hyperlipidemia: . Continue statin therapy, increase Lipitor to 20  mg p.o. nightly. . Patient denies myalgia or other side effects.  Non-insulin-dependent diabetes mellitus type 2: Currently managed by PCP.  No recent hemoglobin A1c for review.  Recommend follow-up in 6 months to reevaluate therapy and symptoms.  Prior to the next follow-up visit would recommend blood work to evaluate kidney function, electrolytes, lipid profile, and NT proBNP.  --Continue cardiac medications as reconciled in final medication list. --Return in about 6 months (around 10/13/2020) for heart failure management.. Or sooner if needed. --Continue follow-up with your primary care physician regarding the management of your other chronic comorbid conditions.  Patient's questions and concerns  were addressed to her satisfaction. She voices understanding of the instructions provided during this encounter.   This note was created using a voice recognition software as a result there may be grammatical errors inadvertently enclosed that do not reflect the nature of this encounter. Every attempt is made to correct such errors.  Rex Kras, [place], [facility]  Pager: [phone] Office: [phone]

## 2020-04-14 NOTE — Telephone Encounter (Signed)
Discussed at the last office visit. Now resolved.

## 2020-04-19 DIAGNOSIS — Z1211 Encounter for screening for malignant neoplasm of colon: Secondary | ICD-10-CM | POA: Diagnosis not present

## 2020-04-19 DIAGNOSIS — C50919 Malignant neoplasm of unspecified site of unspecified female breast: Secondary | ICD-10-CM | POA: Diagnosis not present

## 2020-04-19 DIAGNOSIS — Z1231 Encounter for screening mammogram for malignant neoplasm of breast: Secondary | ICD-10-CM | POA: Diagnosis not present

## 2020-04-19 DIAGNOSIS — M8580 Other specified disorders of bone density and structure, unspecified site: Secondary | ICD-10-CM | POA: Diagnosis not present

## 2020-05-03 ENCOUNTER — Other Ambulatory Visit: Payer: Self-pay | Admitting: Cardiology

## 2020-05-03 DIAGNOSIS — I428 Other cardiomyopathies: Secondary | ICD-10-CM

## 2020-06-03 ENCOUNTER — Encounter: Payer: Self-pay | Admitting: Genetic Counselor

## 2020-06-22 ENCOUNTER — Other Ambulatory Visit: Payer: Self-pay

## 2020-06-22 ENCOUNTER — Emergency Department (HOSPITAL_BASED_OUTPATIENT_CLINIC_OR_DEPARTMENT_OTHER): Payer: Medicare Other

## 2020-06-22 ENCOUNTER — Emergency Department (HOSPITAL_BASED_OUTPATIENT_CLINIC_OR_DEPARTMENT_OTHER)
Admission: EM | Admit: 2020-06-22 | Discharge: 2020-06-22 | Disposition: A | Discharge status: Home / Self Care | Payer: Medicare Other | Attending: Emergency Medicine | Admitting: Emergency Medicine

## 2020-06-22 ENCOUNTER — Encounter (HOSPITAL_BASED_OUTPATIENT_CLINIC_OR_DEPARTMENT_OTHER): Payer: Self-pay

## 2020-06-22 DIAGNOSIS — Z5321 Procedure and treatment not carried out due to patient leaving prior to being seen by health care provider: Secondary | ICD-10-CM | POA: Diagnosis not present

## 2020-06-22 DIAGNOSIS — Y939 Activity, unspecified: Secondary | ICD-10-CM | POA: Insufficient documentation

## 2020-06-22 DIAGNOSIS — Y999 Unspecified external cause status: Secondary | ICD-10-CM | POA: Diagnosis not present

## 2020-06-22 DIAGNOSIS — X58XXXA Exposure to other specified factors, initial encounter: Secondary | ICD-10-CM | POA: Diagnosis not present

## 2020-06-22 DIAGNOSIS — S0990XA Unspecified injury of head, initial encounter: Secondary | ICD-10-CM | POA: Insufficient documentation

## 2020-06-22 DIAGNOSIS — Y929 Unspecified place or not applicable: Secondary | ICD-10-CM | POA: Insufficient documentation

## 2020-06-22 DIAGNOSIS — G9389 Other specified disorders of brain: Secondary | ICD-10-CM | POA: Diagnosis not present

## 2020-06-22 DIAGNOSIS — I7090 Unspecified atherosclerosis: Secondary | ICD-10-CM | POA: Diagnosis not present

## 2020-06-22 DIAGNOSIS — I6782 Cerebral ischemia: Secondary | ICD-10-CM | POA: Diagnosis not present

## 2020-06-22 NOTE — ED Triage Notes (Addendum)
Pt states she "bumped her head twice on Saturday"-c/o con'td [facility] since events-denies LOC and other sx-states she spoke with PCP and was advised to come to ED for CT scan-pt NAD-steady gait

## 2020-06-22 NOTE — ED Notes (Addendum)
Pt called for rm 13. No answer in lobby. Pt left per registration

## 2020-06-22 NOTE — ED Notes (Signed)
Discussed case with EDP [place]-orders received

## 2020-06-28 DIAGNOSIS — I672 Cerebral atherosclerosis: Secondary | ICD-10-CM | POA: Diagnosis not present

## 2020-06-28 DIAGNOSIS — G44319 Acute post-traumatic headache, not intractable: Secondary | ICD-10-CM | POA: Diagnosis not present

## 2020-07-06 ENCOUNTER — Other Ambulatory Visit: Payer: Self-pay | Admitting: Cardiology

## 2020-07-06 DIAGNOSIS — I428 Other cardiomyopathies: Secondary | ICD-10-CM

## 2020-07-06 DIAGNOSIS — I10 Essential (primary) hypertension: Secondary | ICD-10-CM

## 2020-08-16 DIAGNOSIS — C50911 Malignant neoplasm of unspecified site of right female breast: Secondary | ICD-10-CM | POA: Diagnosis not present

## 2020-08-16 DIAGNOSIS — Z9012 Acquired absence of left breast and nipple: Secondary | ICD-10-CM | POA: Diagnosis not present

## 2020-08-18 ENCOUNTER — Ambulatory Visit
Admission: RE | Admit: 2020-08-18 | Discharge: 2020-08-18 | Disposition: A | Discharge status: Home / Self Care | Payer: Medicare Other | Source: Ambulatory Visit | Attending: Endocrinology | Admitting: Endocrinology

## 2020-08-18 DIAGNOSIS — E041 Nontoxic single thyroid nodule: Secondary | ICD-10-CM | POA: Diagnosis not present

## 2020-08-18 DIAGNOSIS — E049 Nontoxic goiter, unspecified: Secondary | ICD-10-CM

## 2020-08-26 DIAGNOSIS — E059 Thyrotoxicosis, 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: Secondary | ICD-10-CM | POA: Diagnosis not present

## 2020-08-26 DIAGNOSIS — E1165 Type 2 diabetes mellitus with hyperglycemia: Secondary | ICD-10-CM | POA: Diagnosis not present

## 2020-08-26 DIAGNOSIS — E042 Nontoxic multinodular goiter: Secondary | ICD-10-CM | POA: Diagnosis not present

## 2020-08-29 DIAGNOSIS — R748 Abnormal levels of other serum enzymes: Secondary | ICD-10-CM | POA: Diagnosis not present

## 2020-08-30 ENCOUNTER — Other Ambulatory Visit: Payer: Self-pay | Admitting: Endocrinology

## 2020-08-30 DIAGNOSIS — E042 Nontoxic multinodular goiter: Secondary | ICD-10-CM

## 2020-08-31 ENCOUNTER — Encounter: Payer: Self-pay | Admitting: Emergency Medicine

## 2020-08-31 DIAGNOSIS — M19041 Primary osteoarthritis, right hand: Secondary | ICD-10-CM | POA: Diagnosis not present

## 2020-08-31 DIAGNOSIS — M064 Inflammatory polyarthropathy: Secondary | ICD-10-CM | POA: Diagnosis not present

## 2020-08-31 DIAGNOSIS — M2550 Pain in unspecified joint: Secondary | ICD-10-CM | POA: Diagnosis not present

## 2020-08-31 DIAGNOSIS — M25761 Osteophyte, right knee: Secondary | ICD-10-CM | POA: Diagnosis not present

## 2020-08-31 DIAGNOSIS — M25762 Osteophyte, left knee: Secondary | ICD-10-CM | POA: Diagnosis not present

## 2020-08-31 DIAGNOSIS — M1990 Unspecified osteoarthritis, unspecified site: Secondary | ICD-10-CM | POA: Diagnosis not present

## 2020-08-31 DIAGNOSIS — M85842 Other specified disorders of bone density and structure, left hand: Secondary | ICD-10-CM | POA: Diagnosis not present

## 2020-08-31 DIAGNOSIS — M76891 Other specified enthesopathies of right lower limb, excluding foot: Secondary | ICD-10-CM | POA: Diagnosis not present

## 2020-08-31 DIAGNOSIS — M76892 Other specified enthesopathies of left lower limb, excluding foot: Secondary | ICD-10-CM | POA: Diagnosis not present

## 2020-08-31 DIAGNOSIS — M6530 Trigger finger, unspecified finger: Secondary | ICD-10-CM | POA: Diagnosis not present

## 2020-09-02 ENCOUNTER — Institutional Professional Consult (permissible substitution): Payer: Medicare Other | Admitting: Neurology

## 2020-09-02 DIAGNOSIS — C50911 Malignant neoplasm of unspecified site of right female breast: Secondary | ICD-10-CM | POA: Diagnosis not present

## 2020-09-28 DIAGNOSIS — R052 Subacute cough: Secondary | ICD-10-CM | POA: Diagnosis not present

## 2020-09-28 DIAGNOSIS — R509 Fever, unspecified: Secondary | ICD-10-CM | POA: Diagnosis not present

## 2020-10-06 ENCOUNTER — Other Ambulatory Visit: Payer: Self-pay | Admitting: Cardiology

## 2020-10-06 DIAGNOSIS — I10 Essential (primary) hypertension: Secondary | ICD-10-CM

## 2020-10-06 DIAGNOSIS — I428 Other cardiomyopathies: Secondary | ICD-10-CM

## 2020-10-13 DIAGNOSIS — E782 Mixed hyperlipidemia: Secondary | ICD-10-CM | POA: Diagnosis not present

## 2020-10-13 DIAGNOSIS — I428 Other cardiomyopathies: Secondary | ICD-10-CM | POA: Diagnosis not present

## 2020-10-14 LAB — BASIC METABOLIC PANEL
BUN/Creatinine Ratio: 20 (ref 12–28)
BUN: 11 mg/dL (ref 8–27)
CO2: 22 mmol/L (ref 20–29)
Calcium: 10.2 mg/dL (ref 8.7–10.3)
Chloride: 97 mmol/L (ref 96–106)
Creatinine, Ser: 0.55 mg/dL — ABNORMAL LOW (ref 0.57–1.00)
GFR calc Af Amer: 111 mL/min/{1.73_m2} (ref >59)
GFR calc non Af Amer: 96 mL/min/{1.73_m2} (ref >59)
Glucose: 101 mg/dL — ABNORMAL HIGH (ref 65–99)
Potassium: 4.5 mmol/L (ref 3.5–5.2)
Sodium: 137 mmol/L (ref 134–144)

## 2020-10-14 LAB — LIPID PANEL WITH LDL/HDL RATIO
Cholesterol, Total: 158 mg/dL (ref 100–199)
HDL: 44 mg/dL (ref >39)
LDL Chol Calc (NIH): 94 mg/dL (ref 0–99)
LDL/HDL Ratio: 2.1 ratio (ref 0.0–3.2)
Triglycerides: 107 mg/dL (ref 0–149)
VLDL Cholesterol Cal: 20 mg/dL (ref 5–40)

## 2020-10-14 LAB — MAGNESIUM: Magnesium: 2.4 mg/dL — ABNORMAL HIGH (ref 1.6–2.3)

## 2020-10-14 LAB — PRO B NATRIURETIC PEPTIDE: NT-Pro BNP: 95 pg/mL (ref 0–301)

## 2020-10-17 ENCOUNTER — Ambulatory Visit: Payer: Medicare Other | Admitting: Cardiology

## 2020-10-17 ENCOUNTER — Other Ambulatory Visit: Payer: Self-pay

## 2020-10-17 ENCOUNTER — Encounter: Payer: Self-pay | Admitting: Cardiology

## 2020-10-17 VITALS — BP 140/57 | HR 71 | Ht 67.0 in | Wt 199.0 lb

## 2020-10-17 DIAGNOSIS — E119 Type 2 diabetes mellitus without complications: Secondary | ICD-10-CM | POA: Diagnosis not present

## 2020-10-17 DIAGNOSIS — I10 Essential (primary) hypertension: Secondary | ICD-10-CM

## 2020-10-17 DIAGNOSIS — E6609 Other obesity due to excess calories: Secondary | ICD-10-CM

## 2020-10-17 DIAGNOSIS — E782 Mixed hyperlipidemia: Secondary | ICD-10-CM | POA: Diagnosis not present

## 2020-10-17 DIAGNOSIS — I428 Other cardiomyopathies: Secondary | ICD-10-CM | POA: Diagnosis not present

## 2020-10-17 DIAGNOSIS — I447 Left bundle-branch block, unspecified: Secondary | ICD-10-CM | POA: Diagnosis not present

## 2020-10-17 MED ORDER — ATORVASTATIN CALCIUM 20 MG PO TABS: 20.0000 mg | ORAL_TABLET | Freq: Every day | ORAL | 1 refills | Status: DC

## 2020-10-17 NOTE — Progress Notes (Signed)
Jordan Willis Date of Birth: [birth year] MRN: [id number] Primary Care Provider:Pharr, Thayer Jew, MD  Former Cardiology Providers: Miquel Dunn, MSN, APRN, FNP-C Primary Cardiologist: Rex Kras, DO (established care 01/20/2020)  Date: 10/17/2020 Last Office Visit: 04/13/2020  Chief Complaint  Patient presents with  . nonischemic cardiomyopathy  . Follow-up    HPI  Jordan Willis is a 69 y.o.  female who presents to the office with a chief complaint of " 6 month follow up for heart failure management." Patient's past medical history and cardiovascular risk factors include: Left bundle branch block, type 2 diabetes mellitus, hyperlipidemia, nonischemic cardiomyopathy, postmenopausal female, advanced age.  Patient is being follow by the practice for the management of NICMP but referred for chest pain evaluation.   In March 2021, she was referred for chest pain evaluation after ER evaluation. Since than underwent echo and stress test. Since the stress test was abnormal she underwent LHC and was found to have angiographically normal epicardial coronary arteries.  However, given her symptoms and and mildly reduced left ventricular systolic function she was started on GDMT.   She is here for a 6 month follow up for heart failure management. She is compliant with her medical therapy and has tolerated the medications without any side effects. No recent hospitalization or urgent care visit for cardiovascular symptoms. She has gained 4 pounds since the last office visit.   No family history of premature coronary artery disease  FUNCTIONAL STATUS: Walking atleast 26min a day 5 days a week.    ALLERGIES: Allergies  Allergen Reactions  . Tape Rash   MEDICATION LIST PRIOR TO VISIT: Current Outpatient Medications on File Prior to Visit  Medication Sig Dispense Refill  . acetaminophen (TYLENOL) 325 MG tablet Take 650 mg by mouth every 6 (six) hours as needed.    Marland Kitchen ascorbic acid (VITAMIN C)  500 MG tablet Take 1,000 mg by mouth daily.    Marland Kitchen aspirin 81 MG EC tablet Take 81 mg by mouth every evening. Swallow whole.    . cetirizine (ZYRTEC) 10 MG tablet Take 10 mg by mouth daily.    . Cholecalciferol (VITAMIN D3) 2000 units TABS Take 2,000 Units by mouth daily.    . Cyanocobalamin (B-12) 5000 MCG CAPS Take 5,000 Units by mouth daily.    Marland Kitchen dexlansoprazole (DEXILANT) 60 MG capsule Take 60 mg by mouth daily as needed.     . Echinacea 500 MG CAPS Take 1 capsule by mouth daily.    . Emollient (COLLAGEN) CREA Apply topically.    Marland Kitchen ENTRESTO 49-51 MG TAKE 1 TABLET BY MOUTH 2 (TWO) TIMES DAILY. 180 tablet 0  . fexofenadine (ALLEGRA) 180 MG tablet Take 180 mg by mouth daily.    Marland Kitchen GLUCOSAMINE-CHONDROITIN PO Take 1 tablet by mouth every evening.     . metFORMIN (GLUCOPHAGE) 500 MG tablet Take 500 mg by mouth daily at 12 noon.     . metoprolol succinate (TOPROL-XL) 25 MG 24 hr tablet TAKE 1 TABLET (25 MG TOTAL) BY MOUTH IN THE MORNING. HOLD IF SYSTOLIC BLOOD PRESSURE (TOP BLOOD PRESSURE NUMBER) LESS THAN 100 MMHG OR HEART RATE LESS THAN 60 BPM (PULSE). 90 tablet 3  . Multiple Vitamin (MULTIVITAMIN WITH MINERALS) TABS tablet Take 1 tablet by mouth daily.    . Multiple Vitamins-Minerals (HAIR SKIN AND NAILS FORMULA) TABS Take by mouth.    . multivitamin-lutein (OCUVITE-LUTEIN) CAPS capsule Take 1 capsule by mouth daily.    . multivitamin-lutein (OCUVITE-LUTEIN) CAPS capsule Take  1 capsule by mouth daily.    . nitroGLYCERIN (NITROSTAT) 0.4 MG SL tablet Place 1 tablet (0.4 mg total) under the tongue every 5 (five) minutes as needed for chest pain. If you require more than two tablets five minutes apart go to the nearest ER via EMS. 90 tablet 0  . Polyethyl Glycol-Propyl Glycol (ULTRA LUBRICANT EYE DROPS OP) Place 1 drop into both eyes 3 (three) times daily as needed (for dry eyes).     Marland Kitchen PROAIR HFA 108 (90 Base) MCG/ACT inhaler Inhale 2 puffs into the lungs every 6 (six) hours as needed for wheezing or  shortness of breath.     . sacubitril-valsartan (ENTRESTO) 24-26 MG Take 1 tablet by mouth 2 (two) times daily.    . sertraline (ZOLOFT) 50 MG tablet Take 50 mg by mouth daily.     . solifenacin (VESICARE) 10 MG tablet Take 10 mg by mouth daily.     . SUPER B COMPLEX/C PO Take by mouth.    . traZODone (DESYREL) 50 MG tablet Take 50-75 mg by mouth at bedtime as needed for sleep.     . Vitamin D, Cholecalciferol, 10 MCG (400 UNIT) CAPS Take by mouth.    . Zinc 50 MG TABS Take by mouth.     No current facility-administered medications on file prior to visit.    PAST MEDICAL HISTORY: Past Medical History:  Diagnosis Date  . Anxiety   . Arthritis    Lumbar spine DDD.  Marland Kitchen Breast cancer, female, left 03/03/2012  . Cardiomyopathy ([place])   . Carpal tunnel syndrome    Bilateral, Mild  . Cerebral atherosclerosis   . Chronic sinusitis   . 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 Diabetes mellitus without complication ([place])    Diet controlled  . Dyspnea    When patient gets sick uses Pro-Air inhaler  . Frequent sinus infections   . GERD (gastroesophageal reflux disease)    occ  . Goiter   . History of cardiomegaly   . History of cataract    Bilateral  . History of kidney stones    08/28/2018 currently has a large kidney stone, asymptomatic at this time  . History of migraine   . History of uterine fibroid   . History of uterine prolapse   . Hyperlipidemia   . Hypertension   . LBBB (left bundle branch block)   . Menopause 03/03/2012  . Nasal turbinate hypertrophy 11/29/2016   Left inferior   . Personal history of chemotherapy   . Personal history of radiation therapy   . PONV (postoperative nausea and vomiting)   . Sinusitis 2014  . Sleep apnea    No CPAP  . SUI (stress urinary incontinence, female)   . SVD (spontaneous vaginal delivery)    x 2  . Thyroid nodule     PAST SURGICAL HISTORY: Past Surgical History:  Procedure Laterality Date  . BILATERAL  SALPINGOOPHORECTOMY Bilateral   . BLADDER SUSPENSION N/A 05/16/2017   Procedure: TRANSVAGINAL TAPE (TVT) PROCEDURE;  Surgeon: Everett Graff, MD;  Location: [facility];  Service: Gynecology;  Laterality: N/A;  . BREAST BIOPSY Right 2009  . BREAST LUMPECTOMY Right 2009  . BREAST LUMPECTOMY Left 2006  . CARDIAC CATHETERIZATION    . CATARACT EXTRACTION Left 10/23/2017  . CATARACT EXTRACTION Right 11/06/2017  . COLONOSCOPY  10/22/2009   Normal.  Repeat 5 years.  Nat [name]  . CYSTOSCOPY N/A 05/16/2017   Procedure: CYSTOSCOPY;  Surgeon: Mancel Bale,  Levada Dy, MD;  Location: [facility];  Service: Gynecology;  Laterality: N/A;  . LEFT HEART CATH AND CORONARY ANGIOGRAPHY N/A 02/09/2020   Procedure: LEFT HEART CATH AND CORONARY ANGIOGRAPHY;  Surgeon: Nigel Mormon, MD;  Location: [facility];  Service: Cardiovascular;  Laterality: N/A;  . ROBOTIC ASSISTED TOTAL HYSTERECTOMY N/A 05/16/2017   Procedure: ROBOTIC ASSISTED TOTAL HYSTERECTOMY;  Surgeon: Delsa Bern, MD;  Location: [facility];  Service: Gynecology;  Laterality: N/A;  . ROTATOR CUFF REPAIR Bilateral   . SHOULDER ARTHROSCOPY WITH ROTATOR CUFF REPAIR AND SUBACROMIAL DECOMPRESSION Right 09/03/2018   Procedure: Right shoulder manipulation under anesthesia, exam under anesthesia, mini open rotator cuff repair, subacromial decompression;  Surgeon: Susa Day, MD;  Location: [facility];  Service: Orthopedics;  Laterality: Right;  90 mins  . TUBAL LIGATION    . WISDOM TOOTH EXTRACTION      FAMILY HISTORY: The patient's family history includes Breast cancer in her sister and sister; Cancer in her sister and sister; Cancer (age of onset: 30) in her mother; Diabetes in her father; Hypertension in her mother.   SOCIAL HISTORY:  The patient  reports that she has never smoked. She has never used smokeless tobacco. She reports that she does not drink alcohol and does not use drugs.  Review of Systems  Constitutional: Negative for chills and fever.  HENT:  Negative for ear discharge, ear pain and nosebleeds.   Eyes: Negative for blurred vision and discharge.  Cardiovascular: Negative for chest pain, claudication, dyspnea on exertion, leg swelling, near-syncope, orthopnea, palpitations, paroxysmal nocturnal dyspnea and syncope.  Respiratory: Negative for cough and shortness of breath.   Endocrine: Negative for polydipsia, polyphagia and polyuria.  Hematologic/Lymphatic: Negative for bleeding problem.  Skin: Negative for flushing and nail changes.  Musculoskeletal: Negative for muscle cramps, muscle weakness and myalgias.  Gastrointestinal: Negative for abdominal pain, dysphagia, hematemesis, hematochezia, melena, nausea and vomiting.  Neurological: Negative for dizziness, focal weakness and light-headedness.    PHYSICAL EXAM: Vitals with BMI 10/17/2020 06/22/2020 04/13/2020  Height 5\' 7"  - 5\' 7"   Weight 199 lbs - 195 lbs  BMI [id number] - [id number]  Systolic [id number]  Diastolic 57 65 72  Pulse 71 83 90    CONSTITUTIONAL: Well-developed and well-nourished. No acute distress.  SKIN: Skin is warm and dry. No rash noted. No cyanosis. No pallor. No jaundice HEAD: Normocephalic and atraumatic.  EYES: No scleral icterus MOUTH/THROAT: Moist oral membranes.  NECK: No JVD present. No thyromegaly noted. No carotid bruits  LYMPHATIC: No visible cervical adenopathy.  CHEST Normal respiratory effort. No intercostal retractions  LUNGS: Clear to auscultation bilaterally.  No stridor. No wheezes. No rales.  CARDIOVASCULAR: Regular rate and rhythm, positive S1-S2, no murmurs rubs or gallops appreciated. ABDOMINAL: No apparent ascites.  EXTREMITIES: No peripheral edema  HEMATOLOGIC: No significant bruising NEUROLOGIC: Oriented to person, place, and time. Nonfocal. Normal muscle tone.  PSYCHIATRIC: Normal mood and affect. Normal behavior. Cooperative  CARDIAC DATABASE: EKG: 10/17/2020: Normal sinus rhythm, 62 bpm, left bundle branch block, ST-T changes most  likely secondary to LBBB.  Echocardiogram: 02/13/2017 at [facility]: EF 60-65%, no regional wall motion abnormalities, mild MR.  02/01/2020: LVEF 40-45% with regional wall motion abnormalities, mild LVH,, grade 1 diastolic impairment, normal left atrial pressure, moderate MR, mild TR, RVSP 24 mmHg, moderate PR.  02/09/2020 limited echo with Definity: LVEF 40-45% with regional wall motion abnormalities, no left ventricular thrombus.  Stress Testing:  02/14/2018 exercise nuclear stress test at [facility]: Achieved 7 METS, normal blood pressure response to exercise, had exertional chest pain that improved/resolved during recovery, normal myocardial perfusion study no EKG abnormalities per report.  Heart Catheterization: 02/09/2020 by Dr. Evlyn Clines Patwardhan: Right dominant system, no angiographically significant coronary artery disease.  Normal LVEDP.  LABORATORY DATA: CBC Latest Ref Rng & Units 02/09/2020 01/05/2020 09/04/2018  WBC 4.0 - 10.5 K/uL 4.8 5.8 12.0(H)  Hemoglobin 12.0 - 15.0 g/dL 66.4 40.3 47.4  Hematocrit 36.0 - 46.0 % 41.6 39.4 41.3  Platelets 150 - 400 K/uL 282 272 259    CMP Latest Ref Rng & Units 10/13/2020 04/08/2020 03/24/2020  Glucose 65 - 99 mg/dL 259(D) 82 638(V)  BUN 8 - 27 mg/dL 11 11 14   Creatinine 0.57 - 1.00 mg/dL ) 5.64(P 3.29  Sodium 134 - 144 mmol/L 137 143 139  Potassium 3.5 - 5.2 mmol/L 4.5 4.4 4.4  Chloride 96 - 106 mmol/L 97 105 101  CO2 20 - 29 mmol/L 22 25 24   Calcium 8.7 - 10.3 mg/dL 5.18 10.4(H)  Total Protein 6.4 - 8.3 g/dL - - -  Total Bilirubin 0.20 - 1.20 mg/dL - - -  Alkaline Phos 40 - 150 U/L - - -  AST 5 - 34 U/L - - -  ALT 0 - 55 U/L - - -    Lipid Panel: Outside records 06/24/2018: Total cholesterol 176, triglycerides 104, HDL 52, LDL 103  Lipid Panel  Lab Results  Component Value Date   CHOL 158 10/13/2020   HDL 44 10/13/2020   LDLCALC 94 10/13/2020   TRIG 107 10/13/2020   CHOLHDL 6.2 10/11/2013     Lab Results  Component Value Date   HGBA1C 6.1 (H) 08/28/2018   HGBA1C 6.8 (H) 10/11/2013   No components found for: NTPROBNP Lab Results  Component Value Date   TSH 0.599 10/11/2013   TSH 0.401 07/02/2012    Cardiac Panel (last 3 results) No results for input(s): CKTOTAL, CKMB, TROPONINIHS, RELINDX in the last 72 hours.  FINAL MEDICATION LIST END OF ENCOUNTER: Meds ordered this encounter  Medications  . atorvastatin (LIPITOR) 20 MG tablet    Sig: Take 1 tablet (20 mg total) by mouth at bedtime.    Dispense:  90 tablet    Refill:  1    Medications Discontinued During This Encounter  Medication Reason  . Garlic 2000 MG TBEC Completed Course  . L-Lysine 1000 MG TABS Completed Course  . atorvastatin (LIPITOR) 20 MG tablet Reorder     Current Outpatient Medications:  .  acetaminophen (TYLENOL) 325 MG tablet, Take 650 mg by mouth every 6 (six) hours as needed., Disp: , Rfl:  .  ascorbic acid (VITAMIN C) 500 MG tablet, Take 1,000 mg by mouth daily., Disp: , Rfl:  .  aspirin 81 MG EC tablet, Take 81 mg by mouth every evening. Swallow whole., Disp: , Rfl:  .  cetirizine (ZYRTEC) 10 MG tablet, Take 10 mg by mouth daily., Disp: , Rfl:  .  Cholecalciferol (VITAMIN D3) 2000 units TABS, Take 2,000 Units by mouth daily., Disp: , Rfl:  .  Cyanocobalamin (B-12) 5000 MCG CAPS, Take 5,000 Units by mouth daily., Disp: , Rfl:  .  dexlansoprazole (DEXILANT) 60 MG capsule, Take 60 mg by mouth daily as needed. , Disp: , Rfl:  .  Echinacea 500 MG CAPS, Take 1 capsule by mouth daily., Disp: , Rfl:  .  Emollient (COLLAGEN) CREA, Apply topically., Disp: , Rfl:  .  ENTRESTO 49-51 MG,  TAKE 1 TABLET BY MOUTH 2 (TWO) TIMES DAILY., Disp: 180 tablet, Rfl: 0 .  fexofenadine (ALLEGRA) 180 MG tablet, Take 180 mg by mouth daily., Disp: , Rfl:  .  GLUCOSAMINE-CHONDROITIN PO, Take 1 tablet by mouth every evening. , Disp: , Rfl:  .  metFORMIN (GLUCOPHAGE) 500 MG tablet, Take 500 mg by mouth daily at 12  noon. , Disp: , Rfl:  .  metoprolol succinate (TOPROL-XL) 25 MG 24 hr tablet, TAKE 1 TABLET (25 MG TOTAL) BY MOUTH IN THE MORNING. HOLD IF SYSTOLIC BLOOD PRESSURE (TOP BLOOD PRESSURE NUMBER) LESS THAN 100 MMHG OR HEART RATE LESS THAN 60 BPM (PULSE)., Disp: 90 tablet, Rfl: 3 .  Multiple Vitamin (MULTIVITAMIN WITH MINERALS) TABS tablet, Take 1 tablet by mouth daily., Disp: , Rfl:  .  Multiple Vitamins-Minerals (HAIR SKIN AND NAILS FORMULA) TABS, Take by mouth., Disp: , Rfl:  .  multivitamin-lutein (OCUVITE-LUTEIN) CAPS capsule, Take 1 capsule by mouth daily., Disp: , Rfl:  .  multivitamin-lutein (OCUVITE-LUTEIN) CAPS capsule, Take 1 capsule by mouth daily., Disp: , Rfl:  .  nitroGLYCERIN (NITROSTAT) 0.4 MG SL tablet, Place 1 tablet (0.4 mg total) under the tongue every 5 (five) minutes as needed for chest pain. If you require more than two tablets five minutes apart go to the nearest ER via EMS., Disp: 90 tablet, Rfl: 0 .  Polyethyl Glycol-Propyl Glycol (ULTRA LUBRICANT EYE DROPS OP), Place 1 drop into both eyes 3 (three) times daily as needed (for dry eyes). , Disp: , Rfl:  .  PROAIR HFA 108 (90 Base) MCG/ACT inhaler, Inhale 2 puffs into the lungs every 6 (six) hours as needed for wheezing or shortness of breath. , Disp: , Rfl:  .  sacubitril-valsartan (ENTRESTO) 24-26 MG, Take 1 tablet by mouth 2 (two) times daily., Disp: , Rfl:  .  sertraline (ZOLOFT) 50 MG tablet, Take 50 mg by mouth daily. , Disp: , Rfl:  .  solifenacin (VESICARE) 10 MG tablet, Take 10 mg by mouth daily. , Disp: , Rfl:  .  SUPER B COMPLEX/C PO, Take by mouth., Disp: , Rfl:  .  traZODone (DESYREL) 50 MG tablet, Take 50-75 mg by mouth at bedtime as needed for sleep. , Disp: , Rfl:  .  Vitamin D, Cholecalciferol, 10 MCG (400 UNIT) CAPS, Take by mouth., Disp: , Rfl:  .  Zinc 50 MG TABS, Take by mouth., Disp: , Rfl:  .  atorvastatin (LIPITOR) 20 MG tablet, Take 1 tablet (20 mg total) by mouth at bedtime., Disp: 90 tablet, Rfl:  1  IMPRESSION:    ICD-10-CM   1. NICM (nonischemic cardiomyopathy) (HCC)  I42.8 EKG 12-Lead    PCV ECHOCARDIOGRAM COMPLETE  2. Essential hypertension  I10   3. LBBB (left bundle branch block)  I44.7   4. Non-insulin dependent type 2 diabetes mellitus ([place])  E11.9   5. Mixed hyperlipidemia  E78.2 atorvastatin (LIPITOR) 20 MG tablet  6. Class 1 obesity due to excess calories with serious comorbidity and body mass index (BMI) of 31.0 to 31.9 in adult  E66.09    Z68.31      RECOMMENDATIONS: Jordan Willis is a 69 y.o. female whose past medical history and cardiovascular risk factors include: Nonischemic cardiomyopathy, left bundle branch block, type 2 diabetes mellitus, hyperlipidemia, postmenopausal female, advanced age.  Nonischemic cardiomyopathy:  Continue current medical therapy.   No recent hospitalization or urgent care visit since last office visit.   EKG interpreted and noted above.   Echo prior  to the next office visit.   Benign essential hypertension: . Continue current medications.  . Patient states that home BP are better controlled.  . Recommend a goal SBP less than or equal to 181mmHg.  . Currently managed by PCP.  Marland Kitchen Low salt diet recommended. A diet that is rich in fruits, vegetables, legumes, and low-fat dairy products and low in snacks, sweets, and meats (such as the Dietary Approaches to Stop Hypertension [DASH] diet).   Mixed hyperlipidemia: . Refilled statin therapy.  . Independently reviewed the the most recent lipid profile from 09/2020 with the patient at today's visit.  Marland Kitchen Recent lipid profile reviewed. LDL continues to be >70mg  /dL. Will increase Lipitor to 20 mg p.o. nightly. . Patient denies myalgia or other side effects.  Non-insulin-dependent diabetes mellitus type 2: Currently managed by PCP.  No recent hemoglobin A1c for review.  --Continue cardiac medications as reconciled in final medication list. --Return in about 6 months (around 04/17/2021) for  Follow up [place]. . Or sooner if needed. --Continue follow-up with your primary care physician regarding the management of your other chronic comorbid conditions.  Patient's questions and concerns were addressed to her satisfaction. She voices understanding of the instructions provided during this encounter.   This note was created using a voice recognition software as a result there may be grammatical errors inadvertently enclosed that do not reflect the nature of this encounter. Every attempt is made to correct such errors.  Rex Kras, [place], [facility]  Pager: [phone] Office: [phone]

## 2020-10-23 NOTE — Progress Notes (Incomplete)
[name] Date of Birth: Apr 01, 1951 MRN: [id number] Primary Care Provider:Pharr, Thayer Jew, MD  Former Cardiology Providers: Miquel Dunn, MSN, APRN, FNP-C Primary Cardiologist: Rex Kras, DO (established care 01/20/2020)  Date: 10/17/2020 Last Office Visit: 04/13/2020  Chief Complaint  Patient presents with  . nonischemic cardiomyopathy  . Follow-up    HPI  Jordan Willis is a 70 y.o.  female who presents to the office with a chief complaint of " 6 month follow up for heart failure management." Patient's past medical history and cardiovascular risk factors include: Left bundle branch block, type 2 diabetes mellitus, hyperlipidemia, nonischemic cardiomyopathy, postmenopausal female, advanced age.  Patient is being follow by the practice for the management of NICMP but referred for chest pain evaluation.   In March 2021, she was referred for chest pain evaluation after ER evaluation. Since than underwent echo and stress test. Since the stress test was abnormal she underwent LHC and was found to have angiographically normal epicardial coronary arteries.  However, given her symptoms and and mildly reduced left ventricular systolic function she was started on GDMT.   She is here for a 6 month follow up for heart failure management. She is compliant with her medical therapy and has tolerated the medications without any side effects. No recent hospitalization or urgent care visit for cardiovascular symptoms. She has gained 4 pounds since the last office visit.   No family history of premature coronary artery disease  FUNCTIONAL STATUS: Walking atleast 29min a day 5 days a week.    ALLERGIES: Allergies  Allergen Reactions  . Tape Rash   MEDICATION LIST PRIOR TO VISIT: Current Outpatient Medications on File Prior to Visit  Medication Sig Dispense Refill  . acetaminophen (TYLENOL) 325 MG tablet Take 650 mg by mouth every 6 (six) hours as needed.    Marland Kitchen ascorbic acid (VITAMIN C)  500 MG tablet Take 1,000 mg by mouth daily.    Marland Kitchen aspirin 81 MG EC tablet Take 81 mg by mouth every evening. Swallow whole.    . cetirizine (ZYRTEC) 10 MG tablet Take 10 mg by mouth daily.    . Cholecalciferol (VITAMIN D3) 2000 units TABS Take 2,000 Units by mouth daily.    . Cyanocobalamin (B-12) 5000 MCG CAPS Take 5,000 Units by mouth daily.    Marland Kitchen dexlansoprazole (DEXILANT) 60 MG capsule Take 60 mg by mouth daily as needed.     . Echinacea 500 MG CAPS Take 1 capsule by mouth daily.    . Emollient (COLLAGEN) CREA Apply topically.    Marland Kitchen ENTRESTO 49-51 MG TAKE 1 TABLET BY MOUTH 2 (TWO) TIMES DAILY. 180 tablet 0  . fexofenadine (ALLEGRA) 180 MG tablet Take 180 mg by mouth daily.    Marland Kitchen GLUCOSAMINE-CHONDROITIN PO Take 1 tablet by mouth every evening.     . metFORMIN (GLUCOPHAGE) 500 MG tablet Take 500 mg by mouth daily at 12 noon.     . metoprolol succinate (TOPROL-XL) 25 MG 24 hr tablet TAKE 1 TABLET (25 MG TOTAL) BY MOUTH IN THE MORNING. HOLD IF SYSTOLIC BLOOD PRESSURE (TOP BLOOD PRESSURE NUMBER) LESS THAN 100 MMHG OR HEART RATE LESS THAN 60 BPM (PULSE). 90 tablet 3  . Multiple Vitamin (MULTIVITAMIN WITH MINERALS) TABS tablet Take 1 tablet by mouth daily.    . Multiple Vitamins-Minerals (HAIR SKIN AND NAILS FORMULA) TABS Take by mouth.    . multivitamin-lutein (OCUVITE-LUTEIN) CAPS capsule Take 1 capsule by mouth daily.    . multivitamin-lutein (OCUVITE-LUTEIN) CAPS capsule Take  1 capsule by mouth daily.    . nitroGLYCERIN (NITROSTAT) 0.4 MG SL tablet Place 1 tablet (0.4 mg total) under the tongue every 5 (five) minutes as needed for chest pain. If you require more than two tablets five minutes apart go to the nearest ER via EMS. 90 tablet 0  . Polyethyl Glycol-Propyl Glycol (ULTRA LUBRICANT EYE DROPS OP) Place 1 drop into both eyes 3 (three) times daily as needed (for dry eyes).     Marland Kitchen PROAIR HFA 108 (90 Base) MCG/ACT inhaler Inhale 2 puffs into the lungs every 6 (six) hours as needed for wheezing or  shortness of breath.     . sacubitril-valsartan (ENTRESTO) 24-26 MG Take 1 tablet by mouth 2 (two) times daily.    . sertraline (ZOLOFT) 50 MG tablet Take 50 mg by mouth daily.     . solifenacin (VESICARE) 10 MG tablet Take 10 mg by mouth daily.     . SUPER B COMPLEX/C PO Take by mouth.    . traZODone (DESYREL) 50 MG tablet Take 50-75 mg by mouth at bedtime as needed for sleep.     . Vitamin D, Cholecalciferol, 10 MCG (400 UNIT) CAPS Take by mouth.    . Zinc 50 MG TABS Take by mouth.     No current facility-administered medications on file prior to visit.    PAST MEDICAL HISTORY: Past Medical History:  Diagnosis Date  . Anxiety   . Arthritis    Lumbar spine DDD.  Marland Kitchen Breast cancer, female, left 03/03/2012  . Cardiomyopathy ([place])   . Carpal tunnel syndrome    Bilateral, Mild  . Cerebral atherosclerosis   . Chronic sinusitis   . 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 Diabetes mellitus without complication ([place])    Diet controlled  . Dyspnea    When patient gets sick uses Pro-Air inhaler  . Frequent sinus infections   . GERD (gastroesophageal reflux disease)    occ  . Goiter   . History of cardiomegaly   . History of cataract    Bilateral  . History of kidney stones    08/28/2018 currently has a large kidney stone, asymptomatic at this time  . History of migraine   . History of uterine fibroid   . History of uterine prolapse   . Hyperlipidemia   . Hypertension   . LBBB (left bundle branch block)   . Menopause 03/03/2012  . Nasal turbinate hypertrophy 11/29/2016   Left inferior   . Personal history of chemotherapy   . Personal history of radiation therapy   . PONV (postoperative nausea and vomiting)   . Sinusitis 2014  . Sleep apnea    No CPAP  . SUI (stress urinary incontinence, female)   . SVD (spontaneous vaginal delivery)    x 2  . Thyroid nodule     PAST SURGICAL HISTORY: Past Surgical History:  Procedure Laterality Date  . BILATERAL  SALPINGOOPHORECTOMY Bilateral   . BLADDER SUSPENSION N/A 05/16/2017   Procedure: TRANSVAGINAL TAPE (TVT) PROCEDURE;  Surgeon: Everett Graff, MD;  Location: [facility];  Service: Gynecology;  Laterality: N/A;  . BREAST BIOPSY Right 2009  . BREAST LUMPECTOMY Right 2009  . BREAST LUMPECTOMY Left 2006  . CARDIAC CATHETERIZATION    . CATARACT EXTRACTION Left 10/23/2017  . CATARACT EXTRACTION Right 11/06/2017  . COLONOSCOPY  10/22/2009   Normal.  Repeat 5 years.  Nat [name]  . CYSTOSCOPY N/A 05/16/2017   Procedure: CYSTOSCOPY;  Surgeon: Mancel Bale,  Levada Dy, MD;  Location: [facility];  Service: Gynecology;  Laterality: N/A;  . LEFT HEART CATH AND CORONARY ANGIOGRAPHY N/A 02/09/2020   Procedure: LEFT HEART CATH AND CORONARY ANGIOGRAPHY;  Surgeon: Nigel Mormon, MD;  Location: [facility];  Service: Cardiovascular;  Laterality: N/A;  . ROBOTIC ASSISTED TOTAL HYSTERECTOMY N/A 05/16/2017   Procedure: ROBOTIC ASSISTED TOTAL HYSTERECTOMY;  Surgeon: Delsa Bern, MD;  Location: [facility];  Service: Gynecology;  Laterality: N/A;  . ROTATOR CUFF REPAIR Bilateral   . SHOULDER ARTHROSCOPY WITH ROTATOR CUFF REPAIR AND SUBACROMIAL DECOMPRESSION Right 09/03/2018   Procedure: Right shoulder manipulation under anesthesia, exam under anesthesia, mini open rotator cuff repair, subacromial decompression;  Surgeon: Susa Day, MD;  Location: [facility];  Service: Orthopedics;  Laterality: Right;  90 mins  . TUBAL LIGATION    . WISDOM TOOTH EXTRACTION      FAMILY HISTORY: The patient's family history includes Breast cancer in her sister and sister; Cancer in her sister and sister; Cancer (age of onset: 50) in her mother; Diabetes in her father; Hypertension in her mother.   SOCIAL HISTORY:  The patient  reports that she has never smoked. She has never used smokeless tobacco. She reports that she does not drink alcohol and does not use drugs.  Review of Systems  Constitutional: Negative for chills and fever.  HENT:  Negative for ear discharge, ear pain and nosebleeds.   Eyes: Negative for blurred vision and discharge.  Cardiovascular: Negative for chest pain, claudication, dyspnea on exertion, leg swelling, near-syncope, orthopnea, palpitations, paroxysmal nocturnal dyspnea and syncope.  Respiratory: Negative for cough and shortness of breath.   Endocrine: Negative for polydipsia, polyphagia and polyuria.  Hematologic/Lymphatic: Negative for bleeding problem.  Skin: Negative for flushing and nail changes.  Musculoskeletal: Negative for muscle cramps, muscle weakness and myalgias.  Gastrointestinal: Negative for abdominal pain, dysphagia, hematemesis, hematochezia, melena, nausea and vomiting.  Neurological: Negative for dizziness, focal weakness and light-headedness.    PHYSICAL EXAM: Vitals with BMI 10/17/2020 06/22/2020 04/13/2020  Height 5\' 7"  - 5\' 7"   Weight 199 lbs - 195 lbs  BMI [id number] - [id number]  Systolic [id number]  Diastolic 57 65 72  Pulse 71 83 90    CONSTITUTIONAL: Well-developed and well-nourished. No acute distress.  SKIN: Skin is warm and dry. No rash noted. No cyanosis. No pallor. No jaundice HEAD: Normocephalic and atraumatic.  EYES: No scleral icterus MOUTH/THROAT: Moist oral membranes.  NECK: No JVD present. No thyromegaly noted. No carotid bruits  LYMPHATIC: No visible cervical adenopathy.  CHEST Normal respiratory effort. No intercostal retractions  LUNGS: Clear to auscultation bilaterally.  No stridor. No wheezes. No rales.  CARDIOVASCULAR: Regular rate and rhythm, positive S1-S2, no murmurs rubs or gallops appreciated. ABDOMINAL: No apparent ascites.  EXTREMITIES: No peripheral edema  HEMATOLOGIC: No significant bruising NEUROLOGIC: Oriented to person, place, and time. Nonfocal. Normal muscle tone.  PSYCHIATRIC: Normal mood and affect. Normal behavior. Cooperative  CARDIAC DATABASE: EKG: 10/17/2020: Normal sinus rhythm, 62 bpm, left bundle branch block, ST-T changes most  likely secondary to LBBB.  Echocardiogram: 02/13/2017 [facility]: EF 60-65%, no regional wall motion abnormalities, mild MR.  02/01/2020: LVEF 40-45% with regional wall motion abnormalities, mild LVH,, grade 1 diastolic impairment, normal left atrial pressure, moderate MR, mild TR, RVSP 24 mmHg, moderate PR.  02/09/2020 limited echo with Definity: LVEF 40-45% with regional wall motion abnormalities, no left ventricular thrombus.  Stress Testing:  02/14/2018 exercise nuclear stress test at [facility]: Achieved 7 METS, normal blood pressure response to exercise, had exertional chest pain that improved/resolved during recovery, normal myocardial perfusion study no EKG abnormalities per report.  Heart Catheterization: 02/09/2020 by Dr. Evlyn Clines Patwardhan: Right dominant system, no angiographically significant coronary artery disease.  Normal LVEDP.  LABORATORY DATA: CBC Latest Ref Rng & Units 02/09/2020 01/05/2020 09/04/2018  WBC 4.0 - 10.5 K/uL 4.8 5.8 12.0(H)  Hemoglobin 12.0 - 15.0 g/dL 66.4 40.3 47.4  Hematocrit 36.0 - 46.0 % 41.6 39.4 41.3  Platelets 150 - 400 K/uL 282 272 259    CMP Latest Ref Rng & Units 10/13/2020 04/08/2020 03/24/2020  Glucose 65 - 99 mg/dL 259(D) 82 638(V)  BUN 8 - 27 mg/dL 11 11 14   Creatinine 0.57 - 1.00 mg/dL ) 5.64(P 3.29  Sodium 134 - 144 mmol/L 137 143 139  Potassium 3.5 - 5.2 mmol/L 4.5 4.4 4.4  Chloride 96 - 106 mmol/L 97 105 101  CO2 20 - 29 mmol/L 22 25 24   Calcium 8.7 - 10.3 mg/dL 5.18 10.4(H)  Total Protein 6.4 - 8.3 g/dL - - -  Total Bilirubin 0.20 - 1.20 mg/dL - - -  Alkaline Phos 40 - 150 U/L - - -  AST 5 - 34 U/L - - -  ALT 0 - 55 U/L - - -    Lipid Panel: Outside records 06/24/2018: Total cholesterol 176, triglycerides 104, HDL 52, LDL 103  Lipid Panel  Lab Results  Component Value Date   CHOL 158 10/13/2020   HDL 44 10/13/2020   LDLCALC 94 10/13/2020   TRIG 107 10/13/2020   CHOLHDL 6.2 10/11/2013     Lab Results  Component Value Date   HGBA1C 6.1 (H) 08/28/2018   HGBA1C 6.8 (H) 10/11/2013   No components found for: NTPROBNP Lab Results  Component Value Date   TSH 0.599 10/11/2013   TSH 0.401 07/02/2012    Cardiac Panel (last 3 results) No results for input(s): CKTOTAL, CKMB, TROPONINIHS, RELINDX in the last 72 hours.  FINAL MEDICATION LIST END OF ENCOUNTER: Meds ordered this encounter  Medications  . atorvastatin (LIPITOR) 20 MG tablet    Sig: Take 1 tablet (20 mg total) by mouth at bedtime.    Dispense:  90 tablet    Refill:  1    Medications Discontinued During This Encounter  Medication Reason  . Garlic 2000 MG TBEC Completed Course  . L-Lysine 1000 MG TABS Completed Course  . atorvastatin (LIPITOR) 20 MG tablet Reorder     Current Outpatient Medications:  .  acetaminophen (TYLENOL) 325 MG tablet, Take 650 mg by mouth every 6 (six) hours as needed., Disp: , Rfl:  .  ascorbic acid (VITAMIN C) 500 MG tablet, Take 1,000 mg by mouth daily., Disp: , Rfl:  .  aspirin 81 MG EC tablet, Take 81 mg by mouth every evening. Swallow whole., Disp: , Rfl:  .  cetirizine (ZYRTEC) 10 MG tablet, Take 10 mg by mouth daily., Disp: , Rfl:  .  Cholecalciferol (VITAMIN D3) 2000 units TABS, Take 2,000 Units by mouth daily., Disp: , Rfl:  .  Cyanocobalamin (B-12) 5000 MCG CAPS, Take 5,000 Units by mouth daily., Disp: , Rfl:  .  dexlansoprazole (DEXILANT) 60 MG capsule, Take 60 mg by mouth daily as needed. , Disp: , Rfl:  .  Echinacea 500 MG CAPS, Take 1 capsule by mouth daily., Disp: , Rfl:  .  Emollient (COLLAGEN) CREA, Apply topically., Disp: , Rfl:  .  ENTRESTO 49-51 MG,  TAKE 1 TABLET BY MOUTH 2 (TWO) TIMES DAILY., Disp: 180 tablet, Rfl: 0 .  fexofenadine (ALLEGRA) 180 MG tablet, Take 180 mg by mouth daily., Disp: , Rfl:  .  GLUCOSAMINE-CHONDROITIN PO, Take 1 tablet by mouth every evening. , Disp: , Rfl:  .  metFORMIN (GLUCOPHAGE) 500 MG tablet, Take 500 mg by mouth daily at 12  noon. , Disp: , Rfl:  .  metoprolol succinate (TOPROL-XL) 25 MG 24 hr tablet, TAKE 1 TABLET (25 MG TOTAL) BY MOUTH IN THE MORNING. HOLD IF SYSTOLIC BLOOD PRESSURE (TOP BLOOD PRESSURE NUMBER) LESS THAN 100 MMHG OR HEART RATE LESS THAN 60 BPM (PULSE)., Disp: 90 tablet, Rfl: 3 .  Multiple Vitamin (MULTIVITAMIN WITH MINERALS) TABS tablet, Take 1 tablet by mouth daily., Disp: , Rfl:  .  Multiple Vitamins-Minerals (HAIR SKIN AND NAILS FORMULA) TABS, Take by mouth., Disp: , Rfl:  .  multivitamin-lutein (OCUVITE-LUTEIN) CAPS capsule, Take 1 capsule by mouth daily., Disp: , Rfl:  .  multivitamin-lutein (OCUVITE-LUTEIN) CAPS capsule, Take 1 capsule by mouth daily., Disp: , Rfl:  .  nitroGLYCERIN (NITROSTAT) 0.4 MG SL tablet, Place 1 tablet (0.4 mg total) under the tongue every 5 (five) minutes as needed for chest pain. If you require more than two tablets five minutes apart go to the nearest ER via EMS., Disp: 90 tablet, Rfl: 0 .  Polyethyl Glycol-Propyl Glycol (ULTRA LUBRICANT EYE DROPS OP), Place 1 drop into both eyes 3 (three) times daily as needed (for dry eyes). , Disp: , Rfl:  .  PROAIR HFA 108 (90 Base) MCG/ACT inhaler, Inhale 2 puffs into the lungs every 6 (six) hours as needed for wheezing or shortness of breath. , Disp: , Rfl:  .  sacubitril-valsartan (ENTRESTO) 24-26 MG, Take 1 tablet by mouth 2 (two) times daily., Disp: , Rfl:  .  sertraline (ZOLOFT) 50 MG tablet, Take 50 mg by mouth daily. , Disp: , Rfl:  .  solifenacin (VESICARE) 10 MG tablet, Take 10 mg by mouth daily. , Disp: , Rfl:  .  SUPER B COMPLEX/C PO, Take by mouth., Disp: , Rfl:  .  traZODone (DESYREL) 50 MG tablet, Take 50-75 mg by mouth at bedtime as needed for sleep. , Disp: , Rfl:  .  Vitamin D, Cholecalciferol, 10 MCG (400 UNIT) CAPS, Take by mouth., Disp: , Rfl:  .  Zinc 50 MG TABS, Take by mouth., Disp: , Rfl:  .  atorvastatin (LIPITOR) 20 MG tablet, Take 1 tablet (20 mg total) by mouth at bedtime., Disp: 90 tablet, Rfl:  1  IMPRESSION:    ICD-10-CM   1. NICM (nonischemic cardiomyopathy) (HCC)  I42.8 EKG 12-Lead    PCV ECHOCARDIOGRAM COMPLETE  2. Essential hypertension  I10   3. LBBB (left bundle branch block)  I44.7   4. Non-insulin dependent type 2 diabetes mellitus ([place])  E11.9   5. Mixed hyperlipidemia  E78.2 atorvastatin (LIPITOR) 20 MG tablet  6. Class 1 obesity due to excess calories with serious comorbidity and body mass index (BMI) of 31.0 to 31.9 in adult  E66.09    Z68.31      RECOMMENDATIONS: Jordan Willis is a 70 y.o. female whose past medical history and cardiovascular risk factors include: Nonischemic cardiomyopathy, left bundle branch block, type 2 diabetes mellitus, hyperlipidemia, postmenopausal female, advanced age.  Nonischemic cardiomyopathy:  Continue current medical therapy.   No recent hospitalization or urgent care visit since last office visit.   EKG interpreted and noted above.   Echo prior  to the next office visit.   Benign essential hypertension: . Office blood pressure is acceptable, but home BP are better controlled per patient.  Marland Kitchen Continue Entresto and metoprolol. . Medication reconciled.  . Low salt diet recommended. A diet that is rich in fruits, vegetables, legumes, and low-fat dairy products and low in snacks, sweets, and meats (such as the Dietary Approaches to Stop Hypertension [DASH] diet).   Mixed hyperlipidemia: . Continue statin therapy.  . Recent lipid profile reviewed. LDL continues to be >70mg  /dL. Will increase Lipitor to 20 mg p.o. nightly. . Patient denies myalgia or other side effects.  Non-insulin-dependent diabetes mellitus type 2: Currently managed by PCP.  No recent hemoglobin A1c for review.  --Continue cardiac medications as reconciled in final medication list. --Return in about 6 months (around 04/17/2021) for Follow up [place]. . Or sooner if needed. --Continue follow-up with your primary care physician regarding the management of your  other chronic comorbid conditions.  Patient's questions and concerns were addressed to her satisfaction. She voices understanding of the instructions provided during this encounter.   This note was created using a voice recognition software as a result there may be grammatical errors inadvertently enclosed that do not reflect the nature of this encounter. Every attempt is made to correct such errors.  Rex Kras, [place], [facility]  Pager: [phone] Office: [phone]

## 2020-11-27 ENCOUNTER — Other Ambulatory Visit: Payer: Self-pay | Admitting: Cardiology

## 2020-11-27 DIAGNOSIS — I428 Other cardiomyopathies: Secondary | ICD-10-CM

## 2020-11-27 DIAGNOSIS — I10 Essential (primary) hypertension: Secondary | ICD-10-CM

## 2020-11-30 DIAGNOSIS — R0981 Nasal congestion: Secondary | ICD-10-CM | POA: Diagnosis not present

## 2020-11-30 DIAGNOSIS — R059 Cough, unspecified: Secondary | ICD-10-CM | POA: Diagnosis not present

## 2020-11-30 DIAGNOSIS — I10 Essential (primary) hypertension: Secondary | ICD-10-CM | POA: Diagnosis not present

## 2020-12-27 DIAGNOSIS — H9313 Tinnitus, bilateral: Secondary | ICD-10-CM | POA: Diagnosis not present

## 2020-12-27 DIAGNOSIS — H9202 Otalgia, left ear: Secondary | ICD-10-CM | POA: Diagnosis not present

## 2020-12-27 DIAGNOSIS — J343 Hypertrophy of nasal turbinates: Secondary | ICD-10-CM | POA: Diagnosis not present

## 2020-12-27 DIAGNOSIS — H6983 Other specified disorders of Eustachian tube, bilateral: Secondary | ICD-10-CM | POA: Diagnosis not present

## 2021-01-02 ENCOUNTER — Ambulatory Visit (INDEPENDENT_AMBULATORY_CARE_PROVIDER_SITE_OTHER): Payer: Medicare Other | Admitting: Otolaryngology

## 2021-01-29 DIAGNOSIS — J0141 Acute recurrent pansinusitis: Secondary | ICD-10-CM | POA: Diagnosis not present

## 2021-02-08 ENCOUNTER — Encounter: Payer: Self-pay | Admitting: Podiatry

## 2021-02-08 ENCOUNTER — Other Ambulatory Visit: Payer: Self-pay

## 2021-02-08 ENCOUNTER — Ambulatory Visit (INDEPENDENT_AMBULATORY_CARE_PROVIDER_SITE_OTHER): Payer: Medicare Other | Admitting: Podiatry

## 2021-02-08 DIAGNOSIS — E1140 Type 2 diabetes mellitus with diabetic neuropathy, unspecified: Secondary | ICD-10-CM | POA: Insufficient documentation

## 2021-02-08 DIAGNOSIS — E1142 Type 2 diabetes mellitus with diabetic polyneuropathy: Secondary | ICD-10-CM

## 2021-02-09 NOTE — Progress Notes (Signed)
This patient returns to my office for at risk foot care.  This patient requires this care by a professional since this patient will be at risk due to having diabetes.  She presents concerned about hot and tingling diabetic feet.  She says this is present for about 2 weeks now.  She also experiences pain running on the outside of her left foot into her fifth toe.  She is concerned about this occurring since she is diabetic. This patient presents for at risk foot care today.  General Appearance  Alert, conversant and in no acute stress.  Vascular  Dorsalis pedis and posterior tibial  pulses are palpable  bilaterally.  Capillary return is within normal limits  bilaterally. Temperature is within normal limits  bilaterally.  Neurologic  Senn-Weinstein monofilament wire test within normal limits  bilaterally. Muscle power within normal limits bilaterally.  Nails Thick disfigured discolored nails with subungual debris  from hallux to fifth toes bilaterally. No evidence of bacterial infection or drainage bilaterally.  Orthopedic  No limitations of motion  feet .  No crepitus or effusions noted.  No bony pathology or digital deformities noted.  Mild  HAV  B/L.  Tailors bunion left foot.  ADV 5th digit  B/l.  Skin  normotropic skin with no porokeratosis noted bilaterally.  No signs of infections or ulcers noted.     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  Consent was obtained for treatment procedures.  Diabetic foot exam performed.  Discussed her symptoms with this patient.  RTC prn.   Return office visit     prn                Told patient to return for periodic foot care and evaluation due to potential at risk complications.   Gardiner Barefoot DPM

## 2021-02-16 DIAGNOSIS — E7800 Pure hypercholesterolemia, unspecified: Secondary | ICD-10-CM | POA: Diagnosis not present

## 2021-02-16 DIAGNOSIS — E1165 Type 2 diabetes mellitus with hyperglycemia: Secondary | ICD-10-CM | POA: Diagnosis not present

## 2021-02-18 DIAGNOSIS — G44209 Tension-type headache, unspecified, not intractable: Secondary | ICD-10-CM | POA: Diagnosis not present

## 2021-02-21 ENCOUNTER — Other Ambulatory Visit: Payer: Self-pay | Admitting: Cardiology

## 2021-02-21 DIAGNOSIS — I110 Hypertensive heart disease with heart failure: Secondary | ICD-10-CM | POA: Diagnosis not present

## 2021-02-21 DIAGNOSIS — R748 Abnormal levels of other serum enzymes: Secondary | ICD-10-CM | POA: Diagnosis not present

## 2021-02-21 DIAGNOSIS — E7800 Pure hypercholesterolemia, unspecified: Secondary | ICD-10-CM | POA: Diagnosis not present

## 2021-02-21 DIAGNOSIS — I10 Essential (primary) hypertension: Secondary | ICD-10-CM

## 2021-02-21 DIAGNOSIS — Z0001 Encounter for general adult medical examination with abnormal findings: Secondary | ICD-10-CM | POA: Diagnosis not present

## 2021-02-21 DIAGNOSIS — I428 Other cardiomyopathies: Secondary | ICD-10-CM

## 2021-02-21 DIAGNOSIS — J309 Allergic rhinitis, unspecified: Secondary | ICD-10-CM | POA: Diagnosis not present

## 2021-02-22 DIAGNOSIS — E1165 Type 2 diabetes mellitus with hyperglycemia: Secondary | ICD-10-CM | POA: Diagnosis not present

## 2021-02-27 DIAGNOSIS — H9313 Tinnitus, bilateral: Secondary | ICD-10-CM | POA: Diagnosis not present

## 2021-02-27 DIAGNOSIS — J343 Hypertrophy of nasal turbinates: Secondary | ICD-10-CM | POA: Diagnosis not present

## 2021-02-27 DIAGNOSIS — H6983 Other specified disorders of Eustachian tube, bilateral: Secondary | ICD-10-CM | POA: Diagnosis not present

## 2021-02-27 DIAGNOSIS — J310 Chronic rhinitis: Secondary | ICD-10-CM | POA: Diagnosis not present

## 2021-02-27 DIAGNOSIS — J342 Deviated nasal septum: Secondary | ICD-10-CM | POA: Diagnosis not present

## 2021-02-27 DIAGNOSIS — H9203 Otalgia, bilateral: Secondary | ICD-10-CM | POA: Diagnosis not present

## 2021-02-28 ENCOUNTER — Other Ambulatory Visit: Payer: Self-pay | Admitting: Internal Medicine

## 2021-02-28 DIAGNOSIS — R7989 Other specified abnormal findings of blood chemistry: Secondary | ICD-10-CM

## 2021-03-04 DIAGNOSIS — R051 Acute cough: Secondary | ICD-10-CM | POA: Diagnosis not present

## 2021-03-04 DIAGNOSIS — R519 Headache, unspecified: Secondary | ICD-10-CM | POA: Diagnosis not present

## 2021-03-04 DIAGNOSIS — W57XXXA Bitten or stung by nonvenomous insect and other nonvenomous arthropods, initial encounter: Secondary | ICD-10-CM | POA: Diagnosis not present

## 2021-03-04 DIAGNOSIS — R5382 Chronic fatigue, unspecified: Secondary | ICD-10-CM | POA: Diagnosis not present

## 2021-03-22 DIAGNOSIS — M8589 Other specified disorders of bone density and structure, multiple sites: Secondary | ICD-10-CM | POA: Diagnosis not present

## 2021-03-24 ENCOUNTER — Ambulatory Visit
Admission: RE | Admit: 2021-03-24 | Discharge: 2021-03-24 | Disposition: A | Discharge status: Home / Self Care | Payer: Medicare Other | Source: Ambulatory Visit | Attending: Internal Medicine | Admitting: Internal Medicine

## 2021-03-24 DIAGNOSIS — N200 Calculus of kidney: Secondary | ICD-10-CM | POA: Diagnosis not present

## 2021-03-24 DIAGNOSIS — R7989 Other specified abnormal findings of blood chemistry: Secondary | ICD-10-CM

## 2021-03-24 DIAGNOSIS — R945 Abnormal results of liver function studies: Secondary | ICD-10-CM | POA: Diagnosis not present

## 2021-03-29 ENCOUNTER — Ambulatory Visit: Payer: Medicaid Other

## 2021-03-29 ENCOUNTER — Other Ambulatory Visit: Payer: Self-pay

## 2021-03-29 DIAGNOSIS — I428 Other cardiomyopathies: Secondary | ICD-10-CM | POA: Diagnosis not present

## 2021-04-18 ENCOUNTER — Ambulatory Visit: Payer: Medicare Other | Admitting: Cardiology

## 2021-04-28 DIAGNOSIS — C50919 Malignant neoplasm of unspecified site of unspecified female breast: Secondary | ICD-10-CM | POA: Diagnosis not present

## 2021-04-28 DIAGNOSIS — M8580 Other specified disorders of bone density and structure, unspecified site: Secondary | ICD-10-CM | POA: Diagnosis not present

## 2021-04-28 DIAGNOSIS — N644 Mastodynia: Secondary | ICD-10-CM | POA: Diagnosis not present

## 2021-04-28 DIAGNOSIS — Z1211 Encounter for screening for malignant neoplasm of colon: Secondary | ICD-10-CM | POA: Diagnosis not present

## 2021-04-28 DIAGNOSIS — N3281 Overactive bladder: Secondary | ICD-10-CM | POA: Diagnosis not present

## 2021-05-01 ENCOUNTER — Ambulatory Visit: Payer: Medicare Other | Admitting: Cardiology

## 2021-05-10 DIAGNOSIS — N644 Mastodynia: Secondary | ICD-10-CM | POA: Diagnosis not present

## 2021-05-10 DIAGNOSIS — N6310 Unspecified lump in the right breast, unspecified quadrant: Secondary | ICD-10-CM | POA: Diagnosis not present

## 2021-05-10 DIAGNOSIS — C50919 Malignant neoplasm of unspecified site of unspecified female breast: Secondary | ICD-10-CM | POA: Diagnosis not present

## 2021-05-13 ENCOUNTER — Other Ambulatory Visit: Payer: Self-pay | Admitting: Cardiology

## 2021-05-13 DIAGNOSIS — E119 Type 2 diabetes mellitus without complications: Secondary | ICD-10-CM | POA: Diagnosis not present

## 2021-05-13 DIAGNOSIS — J069 Acute upper respiratory infection, unspecified: Secondary | ICD-10-CM | POA: Diagnosis not present

## 2021-05-13 DIAGNOSIS — E782 Mixed hyperlipidemia: Secondary | ICD-10-CM

## 2021-05-13 DIAGNOSIS — R519 Headache, unspecified: Secondary | ICD-10-CM | POA: Diagnosis not present

## 2021-05-13 DIAGNOSIS — R42 Dizziness and giddiness: Secondary | ICD-10-CM | POA: Diagnosis not present

## 2021-05-15 ENCOUNTER — Other Ambulatory Visit: Payer: Self-pay | Admitting: Obstetrics and Gynecology

## 2021-05-15 DIAGNOSIS — N63 Unspecified lump in unspecified breast: Secondary | ICD-10-CM

## 2021-05-23 ENCOUNTER — Other Ambulatory Visit: Payer: Self-pay | Admitting: Cardiology

## 2021-05-23 ENCOUNTER — Ambulatory Visit
Admission: RE | Admit: 2021-05-23 | Discharge: 2021-05-23 | Disposition: A | Discharge status: Home / Self Care | Payer: Medicare Other | Source: Ambulatory Visit | Attending: Obstetrics and Gynecology | Admitting: Obstetrics and Gynecology

## 2021-05-23 ENCOUNTER — Other Ambulatory Visit: Payer: Self-pay

## 2021-05-23 DIAGNOSIS — N63 Unspecified lump in unspecified breast: Secondary | ICD-10-CM

## 2021-05-23 DIAGNOSIS — C50411 Malignant neoplasm of upper-outer quadrant of right female breast: Secondary | ICD-10-CM | POA: Diagnosis not present

## 2021-05-23 DIAGNOSIS — I10 Essential (primary) hypertension: Secondary | ICD-10-CM

## 2021-05-23 DIAGNOSIS — I428 Other cardiomyopathies: Secondary | ICD-10-CM

## 2021-05-24 DIAGNOSIS — E7800 Pure hypercholesterolemia, unspecified: Secondary | ICD-10-CM | POA: Diagnosis not present

## 2021-05-24 DIAGNOSIS — E042 Nontoxic multinodular goiter: Secondary | ICD-10-CM | POA: Diagnosis not present

## 2021-05-24 DIAGNOSIS — E1165 Type 2 diabetes mellitus with hyperglycemia: Secondary | ICD-10-CM | POA: Diagnosis not present

## 2021-05-24 DIAGNOSIS — E059 Thyrotoxicosis, 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: Secondary | ICD-10-CM | POA: Diagnosis not present

## 2021-05-26 ENCOUNTER — Ambulatory Visit: Payer: Medicare Other | Admitting: Cardiology

## 2021-05-26 ENCOUNTER — Other Ambulatory Visit: Payer: Self-pay

## 2021-05-26 ENCOUNTER — Encounter: Payer: Self-pay | Admitting: Cardiology

## 2021-05-26 VITALS — BP 136/73 | HR 81 | Temp 97.8°F | Resp 17 | Ht 67.0 in | Wt 200.0 lb

## 2021-05-26 DIAGNOSIS — E6609 Other obesity due to excess calories: Secondary | ICD-10-CM

## 2021-05-26 DIAGNOSIS — E782 Mixed hyperlipidemia: Secondary | ICD-10-CM

## 2021-05-26 DIAGNOSIS — I447 Left bundle-branch block, unspecified: Secondary | ICD-10-CM | POA: Diagnosis not present

## 2021-05-26 DIAGNOSIS — I428 Other cardiomyopathies: Secondary | ICD-10-CM | POA: Diagnosis not present

## 2021-05-26 DIAGNOSIS — I10 Essential (primary) hypertension: Secondary | ICD-10-CM | POA: Diagnosis not present

## 2021-05-26 DIAGNOSIS — E119 Type 2 diabetes mellitus without complications: Secondary | ICD-10-CM | POA: Diagnosis not present

## 2021-05-26 NOTE — Progress Notes (Signed)
Jordan Willis Date of Birth: 08/16/1951 MRN: [id number] Primary Care Provider:Pharr, Thayer Jew, MD  Former Cardiology Providers: Miquel Dunn, MSN, APRN, FNP-C Primary Cardiologist: Rex Kras, DO (established care 01/20/2020)  Date: 05/26/21 Last Office Visit: 10/17/2020   Chief Complaint  Patient presents with   Follow-up    Cardiomyopathy     HPI  Jordan Willis is a 70 y.o.  female who presents to the office with a chief complaint of  " 63-monthfollow-up for cardiomyopathy management." Patient's past medical history and cardiovascular risk factors include: Left bundle branch block, type 2 diabetes mellitus, hyperlipidemia, recovered nonischemic cardiomyopathy, postmenopausal female, advanced age.  Patient is being follow by the practice for the management of NICMP but referred for chest pain evaluation.   Since establishing care patient has undergone an ischemic evaluation as outlined below.  More specifically she has angiographically normal epicardial coronary arteries however her underlying LVEF was mildly reduced and therefore she was started on GDMT including Entresto, Toprol-XL, etc.  Since last office visit patient had an echocardiogram which notes improvement in LVEF from 40-45% to 50-55%.  She is also lost approximately 4 pounds due to lifestyle changes.  Patient denies any anginal discomfort and is overall euvolemic and not in congestive heart failure.  She has not been hospitalized or seen in urgent care for cardiovascular symptoms since last office encounter.  Unfortunately, patient states that she recently was diagnosed with breast cancer and is seeing an oncologist later next week.  No family history of premature coronary artery disease  FUNCTIONAL STATUS: Walking atleast 364m a day 5 days a week.    ALLERGIES: Allergies  Allergen Reactions   Tape Rash   MEDICATION LIST PRIOR TO VISIT: Current Outpatient Medications on File Prior to Visit  Medication  Sig Dispense Refill   acetaminophen (TYLENOL) 325 MG tablet Take 650 mg by mouth every 6 (six) hours as needed.     aspirin 81 MG EC tablet Take 81 mg by mouth every evening. Swallow whole.     atorvastatin (LIPITOR) 20 MG tablet TAKE 1 TABLET BY MOUTH EVERYDAY AT BEDTIME 90 tablet 1   cetirizine (ZYRTEC) 10 MG tablet Take 10 mg by mouth daily.     Cholecalciferol (VITAMIN D3) 2000 units TABS Take 2,000 Units by mouth daily.     Cyanocobalamin (B-12) 5000 MCG CAPS Take 5,000 Units by mouth daily.     Emollient (COLLAGEN) CREA Apply topically.     ENTRESTO 49-51 MG TAKE 1 TABLET BY MOUTH TWICE A DAY 180 tablet 0   fexofenadine (ALLEGRA) 180 MG tablet Take 180 mg by mouth daily.     fluticasone (FLONASE) 50 MCG/ACT nasal spray SPRAY 2 SPRAYS INTO EACH NOSTRIL EVERY DAY     GLUCOSAMINE-CHONDROITIN PO Take 1 tablet by mouth every evening.      glucose blood (ONETOUCH VERIO) test strip TEST ONCE DAILY AS DIRECTED 90     metFORMIN (GLUCOPHAGE) 500 MG tablet Take 500 mg by mouth 2 (two) times daily with a meal.     metoprolol succinate (TOPROL-XL) 25 MG 24 hr tablet TAKE 1 TABLET (25 MG TOTAL) BY MOUTH IN THE MORNING. HOLD IF SYSTOLIC BLOOD PRESSURE (TOP BLOOD PRESSURE NUMBER) LESS THAN 100 MMHG OR HEART RATE LESS THAN 60 BPM (PULSE). 90 tablet 3   Multiple Vitamin (MULTIVITAMIN WITH MINERALS) TABS tablet Take 1 tablet by mouth daily.     multivitamin-lutein (OCUVITE-LUTEIN) CAPS capsule Take 1 capsule by mouth daily.  multivitamin-lutein (OCUVITE-LUTEIN) CAPS capsule Take 1 capsule by mouth daily.     nitroGLYCERIN (NITROSTAT) 0.4 MG SL tablet Place 1 tablet (0.4 mg total) under the tongue every 5 (five) minutes as needed for chest pain. If you require more than two tablets five minutes apart go to the nearest ER via EMS. 90 tablet 0   OneTouch Delica Lancets 99991111 MISC Apply topically.     Polyethyl Glycol-Propyl Glycol (ULTRA LUBRICANT EYE DROPS OP) Place 1 drop into both eyes 3 (three) times daily  as needed (for dry eyes).      PROAIR HFA 108 (90 Base) MCG/ACT inhaler Inhale 2 puffs into the lungs every 6 (six) hours as needed for wheezing or shortness of breath.      sertraline (ZOLOFT) 50 MG tablet Take 50 mg by mouth daily.      solifenacin (VESICARE) 10 MG tablet Take 10 mg by mouth daily.      SUPER B COMPLEX/C PO Take by mouth.     traZODone (DESYREL) 50 MG tablet Take 50-75 mg by mouth at bedtime as needed for sleep.      Vitamin D, Cholecalciferol, 10 MCG (400 UNIT) CAPS Take by mouth.     ascorbic acid (VITAMIN C) 500 MG tablet Take 1,000 mg by mouth daily. (Patient not taking: Reported on 05/26/2021)     Zinc 50 MG TABS Take by mouth. (Patient not taking: Reported on 05/26/2021)     zinc gluconate 50 MG tablet Take by mouth. (Patient not taking: Reported on 05/26/2021)     No current facility-administered medications on file prior to visit.    PAST MEDICAL HISTORY: Past Medical History:  Diagnosis Date   Anxiety    Arthritis    Lumbar spine DDD.   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   Diabetes mellitus without complication (HCC)    Diet controlled   Dyspnea    When patient gets sick uses Pro-Air inhaler   Frequent sinus infections    GERD (gastroesophageal reflux disease)    occ   Goiter    History of cardiomegaly    History of cataract    Bilateral   History of kidney stones    08/28/2018 currently has a large kidney stone, asymptomatic at this time   History of migraine    History of uterine fibroid    History of uterine prolapse    Hyperlipidemia    Hypertension    LBBB (left bundle branch block)    Menopause 03/03/2012   Nasal turbinate hypertrophy 11/29/2016   Left inferior    Personal history of chemotherapy    Personal history of radiation therapy    PONV (postoperative  nausea and vomiting)    Sinusitis 2014   Sleep apnea    No CPAP   SUI (stress urinary incontinence, female)    SVD (spontaneous vaginal delivery)    x 2   Thyroid nodule     PAST SURGICAL HISTORY: Past Surgical History:  Procedure Laterality Date   BILATERAL SALPINGOOPHORECTOMY Bilateral    BLADDER SUSPENSION N/A 05/16/2017   Procedure: TRANSVAGINAL TAPE (TVT) PROCEDURE;  Surgeon: Everett Graff, MD;  Location: [facility];  Service: Gynecology;  Laterality: N/A;   BREAST BIOPSY Right 2009   BREAST LUMPECTOMY Right 2009   BREAST LUMPECTOMY Left  2006   CARDIAC CATHETERIZATION     CATARACT EXTRACTION Left 10/23/2017   CATARACT EXTRACTION Right 11/06/2017   COLONOSCOPY  10/22/2009   Normal.  Repeat 5 years.  [name]   CYSTOSCOPY N/A 05/16/2017   Procedure: CYSTOSCOPY;  Surgeon: Everett Graff, MD;  Location: [facility];  Service: Gynecology;  Laterality: N/A;   LEFT HEART CATH AND CORONARY ANGIOGRAPHY N/A 02/09/2020   Procedure: LEFT HEART CATH AND CORONARY ANGIOGRAPHY;  Surgeon: Nigel Mormon, MD;  Location: [facility];  Service: Cardiovascular;  Laterality: N/A;   ROBOTIC ASSISTED TOTAL HYSTERECTOMY N/A 05/16/2017   Procedure: ROBOTIC ASSISTED TOTAL HYSTERECTOMY;  Surgeon: Delsa Bern, MD;  Location: [facility];  Service: Gynecology;  Laterality: N/A;   ROTATOR CUFF REPAIR Bilateral    SHOULDER ARTHROSCOPY WITH ROTATOR CUFF REPAIR AND SUBACROMIAL DECOMPRESSION Right 09/03/2018   Procedure: Right shoulder manipulation under anesthesia, exam under anesthesia, mini open rotator cuff repair, subacromial decompression;  Surgeon: Susa Day, MD;  Location: [facility];  Service: Orthopedics;  Laterality: Right;  90 mins   TUBAL LIGATION     WISDOM TOOTH EXTRACTION      FAMILY HISTORY: The patient's family history includes Breast cancer in her sister and sister; Cancer in her sister and sister; Cancer (age of onset: 33) in her mother; Diabetes in her father; Hypertension in her  mother.   SOCIAL HISTORY:  The patient  reports that she has never smoked. She has never used smokeless tobacco. She reports that she does not drink alcohol and does not use drugs.  Review of Systems  Constitutional: Negative for chills and fever.  HENT:  Negative for ear discharge, ear pain and nosebleeds.   Eyes:  Negative for blurred vision and discharge.  Cardiovascular:  Negative for chest pain, claudication, dyspnea on exertion, leg swelling, near-syncope, orthopnea, palpitations, paroxysmal nocturnal dyspnea and syncope.  Respiratory:  Negative for cough and shortness of breath.   Endocrine: Negative for polydipsia, polyphagia and polyuria.  Hematologic/Lymphatic: Negative for bleeding problem.  Skin:  Negative for flushing and nail changes.  Musculoskeletal:  Negative for muscle cramps, muscle weakness and myalgias.  Gastrointestinal:  Negative for abdominal pain, dysphagia, hematemesis, hematochezia, melena, nausea and vomiting.  Neurological:  Negative for dizziness, focal weakness and light-headedness.   PHYSICAL EXAM: Vitals with BMI 05/26/2021 10/17/2020 06/22/2020  Height '5\' 7"'$  '5\' 7"'$  -  Weight 200 lbs 199 lbs -  BMI [id number] -  Systolic [id number]  Diastolic 73 57 65  Pulse 81 71 83    CONSTITUTIONAL: Well-developed and well-nourished. No acute distress.  SKIN: Skin is warm and dry. No rash noted. No cyanosis. No pallor. No jaundice HEAD: Normocephalic and atraumatic.  EYES: No scleral icterus MOUTH/THROAT: Moist oral membranes.  NECK: No JVD present. No thyromegaly noted. No carotid bruits  LYMPHATIC: No visible cervical adenopathy.  CHEST Normal respiratory effort. No intercostal retractions  LUNGS: Clear to auscultation bilaterally.  No stridor. No wheezes. No rales.  CARDIOVASCULAR: Regular rate and rhythm, positive S1-S2, no murmurs rubs or gallops appreciated. ABDOMINAL: Soft, nontender, nondistended, positive bowel sounds in all 4 quadrants, no apparent  ascites.  EXTREMITIES: No peripheral edema  HEMATOLOGIC: No significant bruising NEUROLOGIC: Oriented to person, place, and time. Nonfocal. Normal muscle tone.  PSYCHIATRIC: Normal mood and affect. Normal behavior. Cooperative  CARDIAC DATABASE: EKG: 10/17/2020: Normal sinus rhythm, 62 bpm, left bundle branch block, ST-T changes most likely secondary to LBBB.  Echocardiogram: 03/29/2021:  Left ventricle cavity is normal in  size. Moderate concentric hypertrophy of the left ventricle. Normal global wall motion. Normal LV systolic function with visual EF 50-55%. Doppler evidence of grade I (impaired) diastolic dysfunction, normal LAP.  Left atrial cavity is mildly dilated.  Moderate (Grade III) mitral regurgitation.  Moderate tricuspid regurgitation. Estimated pulmonary artery systolic pressure 30 mmHg.  Mild pulmonic regurgitation.  Compared to previous study on 02/01/2020, wall motion and LVEF have improved-previously noted 40-45%.  Stress Testing:  02/14/2018 exercise nuclear stress test at [facility]: Achieved 7 METS, normal blood pressure response to exercise, had exertional chest pain that improved/resolved during recovery, normal myocardial perfusion study no EKG abnormalities per report.  Heart Catheterization: 02/09/2020 by Dr. Joya Gaskins Patwardhan: Right dominant system, no angiographically significant coronary artery disease.  Normal LVEDP.  LABORATORY DATA: CBC Latest Ref Rng & Units 02/09/2020 01/05/2020 09/04/2018  WBC 4.0 - 10.5 K/uL 4.8 5.8 12.0(H)  Hemoglobin 12.0 - 15.0 g/dL 13.1 12.5 12.9  Hematocrit 36.0 - 46.0 % 41.6 39.4 41.3  Platelets 150 - 400 K/uL 282 272 259    CMP Latest Ref Rng & Units 10/13/2020 04/08/2020 03/24/2020  Glucose 65 - 99 mg/dL 101(H) 82 110(H)  BUN 8 - 27 mg/dL '11 11 14  '$ Creatinine 0.57 - 1.00 mg/dL 0.55(L) 0.62 0.59  Sodium 134 - 144 mmol/L 137 143 139  Potassium 3.5 - 5.2 mmol/L 4.5 4.4 4.4  Chloride 96 - 106 mmol/L 97 105 101  CO2 20 -  29 mmol/L '22 25 24  '$ Calcium 8.7 - 10.3 mg/dL 10.2 10.3 10.4(H)  Total Protein 6.4 - 8.3 g/dL - - -  Total Bilirubin 0.20 - 1.20 mg/dL - - -  Alkaline Phos 40 - 150 U/L - - -  AST 5 - 34 U/L - - -  ALT 0 - 55 U/L - - -    Lipid Panel: Outside records 06/24/2018: Total cholesterol 176, triglycerides 104, HDL 52, LDL 103  Lipid Panel  Lab Results  Component Value Date   CHOL 158 10/13/2020   HDL 44 10/13/2020   LDLCALC 94 10/13/2020   TRIG 107 10/13/2020   CHOLHDL 6.2 10/11/2013    Lab Results  Component Value Date   HGBA1C 6.1 (H) 08/28/2018   HGBA1C 6.8 (H) 10/11/2013   No components found for: NTPROBNP Lab Results  Component Value Date   TSH 0.599 10/11/2013   TSH 0.401 07/02/2012    Cardiac Panel (last 3 results) No results for input(s): CKTOTAL, CKMB, TROPONINIHS, RELINDX in the last 72 hours.  FINAL MEDICATION LIST END OF ENCOUNTER: No orders of the defined types were placed in this encounter.   Current Outpatient Medications:    acetaminophen (TYLENOL) 325 MG tablet, Take 650 mg by mouth every 6 (six) hours as needed., Disp: , Rfl:    aspirin 81 MG EC tablet, Take 81 mg by mouth every evening. Swallow whole., Disp: , Rfl:    atorvastatin (LIPITOR) 20 MG tablet, TAKE 1 TABLET BY MOUTH EVERYDAY AT BEDTIME, Disp: 90 tablet, Rfl: 1   cetirizine (ZYRTEC) 10 MG tablet, Take 10 mg by mouth daily., Disp: , Rfl:    Cholecalciferol (VITAMIN D3) 2000 units TABS, Take 2,000 Units by mouth daily., Disp: , Rfl:    Cyanocobalamin (B-12) 5000 MCG CAPS, Take 5,000 Units by mouth daily., Disp: , Rfl:    Emollient (COLLAGEN) CREA, Apply topically., Disp: , Rfl:    ENTRESTO 49-51 MG, TAKE 1 TABLET BY MOUTH TWICE A DAY, Disp: 180 tablet, Rfl: 0   fexofenadine (  ALLEGRA) 180 MG tablet, Take 180 mg by mouth daily., Disp: , Rfl:    fluticasone (FLONASE) 50 MCG/ACT nasal spray, SPRAY 2 SPRAYS INTO EACH NOSTRIL EVERY DAY, Disp: , Rfl:    GLUCOSAMINE-CHONDROITIN PO, Take 1 tablet by mouth  every evening. , Disp: , Rfl:    glucose blood (ONETOUCH VERIO) test strip, TEST ONCE DAILY AS DIRECTED 90, Disp: , Rfl:    metFORMIN (GLUCOPHAGE) 500 MG tablet, Take 500 mg by mouth 2 (two) times daily with a meal., Disp: , Rfl:    metoprolol succinate (TOPROL-XL) 25 MG 24 hr tablet, TAKE 1 TABLET (25 MG TOTAL) BY MOUTH IN THE MORNING. HOLD IF SYSTOLIC BLOOD PRESSURE (TOP BLOOD PRESSURE NUMBER) LESS THAN 100 MMHG OR HEART RATE LESS THAN 60 BPM (PULSE)., Disp: 90 tablet, Rfl: 3   Multiple Vitamin (MULTIVITAMIN WITH MINERALS) TABS tablet, Take 1 tablet by mouth daily., Disp: , Rfl:    multivitamin-lutein (OCUVITE-LUTEIN) CAPS capsule, Take 1 capsule by mouth daily., Disp: , Rfl:    multivitamin-lutein (OCUVITE-LUTEIN) CAPS capsule, Take 1 capsule by mouth daily., Disp: , Rfl:    nitroGLYCERIN (NITROSTAT) 0.4 MG SL tablet, Place 1 tablet (0.4 mg total) under the tongue every 5 (five) minutes as needed for chest pain. If you require more than two tablets five minutes apart go to the nearest ER via EMS., Disp: 90 tablet, Rfl: 0   OneTouch Delica Lancets 99991111 MISC, Apply topically., Disp: , Rfl:    Polyethyl Glycol-Propyl Glycol (ULTRA LUBRICANT EYE DROPS OP), Place 1 drop into both eyes 3 (three) times daily as needed (for dry eyes). , Disp: , Rfl:    PROAIR HFA 108 (90 Base) MCG/ACT inhaler, Inhale 2 puffs into the lungs every 6 (six) hours as needed for wheezing or shortness of breath. , Disp: , Rfl:    sertraline (ZOLOFT) 50 MG tablet, Take 50 mg by mouth daily. , Disp: , Rfl:    solifenacin (VESICARE) 10 MG tablet, Take 10 mg by mouth daily. , Disp: , Rfl:    SUPER B COMPLEX/C PO, Take by mouth., Disp: , Rfl:    traZODone (DESYREL) 50 MG tablet, Take 50-75 mg by mouth at bedtime as needed for sleep. , Disp: , Rfl:    Vitamin D, Cholecalciferol, 10 MCG (400 UNIT) CAPS, Take by mouth., Disp: , Rfl:    ascorbic acid (VITAMIN C) 500 MG tablet, Take 1,000 mg by mouth daily. (Patient not taking: Reported on  05/26/2021), Disp: , Rfl:    Zinc 50 MG TABS, Take by mouth. (Patient not taking: Reported on 05/26/2021), Disp: , Rfl:    zinc gluconate 50 MG tablet, Take by mouth. (Patient not taking: Reported on 05/26/2021), Disp: , Rfl:   IMPRESSION:    ICD-10-CM   1. NICM (nonischemic cardiomyopathy) ([place]), recovered  I42.8     2. LBBB (left bundle branch block)  I44.7     3. Essential hypertension  I10 EKG 12-Lead    4. Non-insulin dependent type 2 diabetes mellitus ([place])  E11.9     5. Mixed hyperlipidemia  E78.2     6. Class 1 obesity due to excess calories with serious comorbidity and body mass index (BMI) of 31.0 to 31.9 in adult  E66.09    Z68.31        RECOMMENDATIONS: EAVAN CHADWICK is a 70 y.o. female whose past medical history and cardiovascular risk factors include: Nonischemic cardiomyopathy, left bundle branch block, type 2 diabetes mellitus, hyperlipidemia, postmenopausal female, advanced age.  Nonischemic cardiomyopathy: Recovered, most recent echocardiogram June 2022 notes an LVEF of 50-55% Euvolemic and not in congestive heart failure Continue current medical therapy.  No recent hospitalization or urgent care visit since last office visit.  Echocardiogram and EKG findings discussed and noted above for further reference  Benign essential hypertension: Continue current medications.  Patient states that home BP are better controlled.  Recommend a goal SBP less than or equal to 155mHg.  Currently managed by PCP.  Low salt diet recommended.   Mixed hyperlipidemia: Tolerating statin therapy well without any side effects or intolerances.   Patient states that she recently had labs with her PCP.  I do not have a copy to review at today's encounter.  We will request records.   Patient is asked to bring in her most recent labs at the next office visit for review/reference.    Non-insulin-dependent diabetes mellitus type 2: Currently managed by PCP.  No recent hemoglobin A1c for  review.  --Continue cardiac medications as reconciled in final medication list. --Return in about 6 months (around 11/26/2021) for Follow up recovered cardiomyopathy . Or sooner if needed. --Continue follow-up with your primary care physician regarding the management of your other chronic comorbid conditions.  Patient's questions and concerns were addressed to her satisfaction. She voices understanding of the instructions provided during this encounter.   This note was created using a voice recognition software as a result there may be grammatical errors inadvertently enclosed that do not reflect the nature of this encounter. Every attempt is made to correct such errors.  SRex Kras DNevada FKal[facility] Pager: [phone]: [phone]

## 2021-05-29 ENCOUNTER — Other Ambulatory Visit: Payer: Self-pay | Admitting: General Surgery

## 2021-05-29 DIAGNOSIS — C50411 Malignant neoplasm of upper-outer quadrant of right female breast: Secondary | ICD-10-CM | POA: Diagnosis not present

## 2021-05-29 DIAGNOSIS — Z170 Estrogen receptor positive status [ER+]: Secondary | ICD-10-CM | POA: Diagnosis not present

## 2021-05-29 DIAGNOSIS — Z853 Personal history of malignant neoplasm of breast: Secondary | ICD-10-CM | POA: Diagnosis not present

## 2021-05-30 NOTE — Progress Notes (Signed)
[facility] CONSULT NOTE  Patient Care Team: [name], MD as PCP - General (Internal Medicine) Delsa Bern, MD as Attending Physician (Obstetrics and Gynecology) Posey Boyer, MD (Inactive) as Consulting Physician (Family Medicine)  CHIEF COMPLAINTS/PURPOSE OF CONSULTATION:  Newly diagnosed right breast cancer  HISTORY OF PRESENTING ILLNESS:  Jordan Willis 70 y.o. female is here because of recent diagnosis of invasive ductal carcinoma of the right breast. She has a history of left breast cancer diagnosed in 2006 and right breast DCIS diagnosed in 2009 having been treated with bilateral lumpectomies, neoadjuvant chemotherapy, radiation therapy, and antiestrogen therapy discontinued in 2010 due to concern of uterine cancer. Biopsy on 05/23/21 showed invasive ductal carcinoma of the right breast at 10:00, Her2+ (3+), ER-, PR+ (20%), Ki67 (50%). She presents to the clinic today for initial evaluation and discussion of treatment options.   I reviewed her records extensively and collaborated the history with the patient.  SUMMARY OF ONCOLOGIC HISTORY: Oncology History  Malignant neoplasm of upper-outer quadrant of left breast in female, estrogen receptor negative ([place])  02/02/2005 Initial Diagnosis   Left breast needle core biopsy showed invasive mammary cancer    02/11/2005 Breast MRI   Left breast upper outer quadrant: 4.4 x 2.6 x 3.7 cm mass and a smaller adjacent mass measuring 1 x 0.8 x 0.4 cm    03/02/2005 - 07/20/2005 Neo-Adjuvant Chemotherapy   AC4 followed by Taxotere 4   08/03/2005 Surgery   Left breast lumpectomy: 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%, PR 0%, HER-2 negative, Ki-67 31%    09/26/2005 - 11/13/2005 Radiation Therapy   Adjuvant radiation therapy    03/18/2008 Initial Biopsy   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    03/22/2008 Surgery   Right breast lumpectomy  0.9 cm DCIS ER 90% PR 98% stage 0    04/11/2008 Procedure   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    05/10/2008 - 07/02/2008 Radiation Therapy   Adjuvant radiation therapy    07/17/2008 - 04/16/2009 Anti-estrogen oral therapy   Femara then switched to tamoxifen which was discontinued in 2010 due to concern about uterine cancer    05/23/2021 Relapse/Recurrence   Screening detected right breast mass at 10 o'clock position measuring 1.2 cm, ultrasound-guided biopsy revealed grade 3 IDC ER 20%, PR 0%, HER2 positive 3+, Ki-67 50%     MEDICAL HISTORY:  Past Medical History:  Diagnosis Date   Anxiety    Arthritis    Lumbar spine DDD.   Breast cancer, female, left 03/03/2012   Cardiomyopathy ([place])    Carpal tunnel syndrome    Bilateral, Mild   Cerebral atherosclerosis    Chronic sinusitis    DCIS (ductal carcinoma in situ) of breast, right 03/03/2012   Diabetes mellitus without complication (HCC)    Diet controlled   Dyspnea    When patient gets sick uses Pro-Air inhaler   Frequent sinus infections    GERD (gastroesophageal reflux disease)    occ   Goiter    History of cardiomegaly    History of cataract    Bilateral   History of kidney stones    08/28/2018 currently has a large kidney stone, asymptomatic at this time   History of migraine    History of uterine fibroid    History of uterine prolapse    Hyperlipidemia    Hypertension    LBBB (left  bundle branch block)    Menopause 03/03/2012   Nasal turbinate hypertrophy 11/29/2016   Left inferior    Personal history of chemotherapy    Personal history of radiation therapy    PONV (postoperative nausea and vomiting)    Sinusitis 2014   Sleep apnea    No CPAP   SUI (stress urinary incontinence, female)    SVD (spontaneous vaginal delivery)    x 2   Thyroid nodule     SURGICAL HISTORY: Past Surgical History:  Procedure Laterality Date   BILATERAL SALPINGOOPHORECTOMY Bilateral     BLADDER SUSPENSION N/A 05/16/2017   Procedure: TRANSVAGINAL TAPE (TVT) PROCEDURE;  Surgeon: [facility] Graff, MD;  Location: [facility];  Service: Gynecology;  Laterality: N/A;   BREAST BIOPSY Right 2009   BREAST LUMPECTOMY Right 2009   BREAST LUMPECTOMY Left 2006   CARDIAC CATHETERIZATION     CATARACT EXTRACTION Left 10/23/2017   CATARACT EXTRACTION Right 11/06/2017   COLONOSCOPY  10/22/2009   Normal.  Repeat 5 years.  [name]   CYSTOSCOPY N/A 05/16/2017   Procedure: CYSTOSCOPY;  Surgeon: [facility] Graff, MD;  Location: [facility];  Service: Gynecology;  Laterality: N/A;   LEFT HEART CATH AND CORONARY ANGIOGRAPHY N/A 02/09/2020   Procedure: LEFT HEART CATH AND CORONARY ANGIOGRAPHY;  Surgeon: Nigel Mormon, MD;  Location: [facility];  Service: Cardiovascular;  Laterality: N/A;   ROBOTIC ASSISTED TOTAL HYSTERECTOMY N/A 05/16/2017   Procedure: ROBOTIC ASSISTED TOTAL HYSTERECTOMY;  Surgeon: Delsa Bern, MD;  Location: [facility];  Service: Gynecology;  Laterality: N/A;   ROTATOR CUFF REPAIR Bilateral    SHOULDER ARTHROSCOPY WITH ROTATOR CUFF REPAIR AND SUBACROMIAL DECOMPRESSION Right 09/03/2018   Procedure: Right shoulder manipulation under anesthesia, exam under anesthesia, mini open rotator cuff repair, subacromial decompression;  Surgeon: Susa Day, MD;  Location: [facility];  Service: Orthopedics;  Laterality: Right;  90 mins   TUBAL LIGATION     WISDOM TOOTH EXTRACTION      SOCIAL HISTORY: Social History   Socioeconomic History   Marital status: Divorced    Spouse name: n/a   Number of children: 2   Years of education: 12   Highest education level: Not on file  Occupational History   Occupation: Retired    Fish farm manager: [employer]    Comment: Psychologist, forensic  Tobacco Use   Smoking status: Never   Smokeless tobacco: Never  Vaping Use   Vaping Use: Never used  Substance and Sexual Activity   Alcohol use: No   Drug use: No   Sexual activity: Yes    Partners: Male    Birth  control/protection: Post-menopausal, Surgical  Other Topics Concern   Not on file  Social History Narrative   Marital status: divorced; dating seriously x many years.       Children:  2 children; 1 grandchild.      Employment: Personnel officer.      Lives: alone      Tobacco: none       Alcohol: none      Exercise:  Walking with sister two days per week.      Seatbelt: 100%      Guns: none      Right-handed   Caffeine: occasional coffee, hot tea or green tea a few times per week   Social Determinants of Health   Financial Resource Strain: Not on file  Food Insecurity: Not on file  Transportation Needs: Not on file  Physical Activity: Not on file  Stress: Not on file  Social Connections: Not on file  Intimate Partner Violence: Not on file    FAMILY HISTORY: Family History  Problem Relation Age of Onset   Cancer Mother 36       breast cancer   Hypertension Mother    Diabetes Father    Cancer Sister        breast cancer   Breast cancer Sister    Cancer Sister        breast cancer   Breast cancer Sister     ALLERGIES:  is allergic to tape.  MEDICATIONS:  Current Outpatient Medications  Medication Sig Dispense Refill   acetaminophen (TYLENOL) 325 MG tablet Take 650 mg by mouth every 6 (six) hours as needed.     aspirin 81 MG EC tablet Take 81 mg by mouth every evening. Swallow whole.     atorvastatin (LIPITOR) 20 MG tablet TAKE 1 TABLET BY MOUTH EVERYDAY AT BEDTIME 90 tablet 1   cetirizine (ZYRTEC) 10 MG tablet Take 10 mg by mouth daily.     Cholecalciferol (VITAMIN D3) 2000 units TABS Take 2,000 Units by mouth daily.     Cyanocobalamin (B-12) 5000 MCG CAPS Take 5,000 Units by mouth daily.     Emollient (COLLAGEN) CREA Apply topically.     ENTRESTO 49-51 MG TAKE 1 TABLET BY MOUTH TWICE A DAY 180 tablet 0   fexofenadine (ALLEGRA) 180 MG tablet Take 180 mg by mouth daily.     fluticasone (FLONASE) 50 MCG/ACT nasal spray SPRAY 2 SPRAYS INTO EACH NOSTRIL EVERY DAY      GLUCOSAMINE-CHONDROITIN PO Take 1 tablet by mouth every evening.      glucose blood (ONETOUCH VERIO) test strip TEST ONCE DAILY AS DIRECTED 90     metFORMIN (GLUCOPHAGE) 500 MG tablet Take 500 mg by mouth 2 (two) times daily with a meal.     metoprolol succinate (TOPROL-XL) 25 MG 24 hr tablet TAKE 1 TABLET (25 MG TOTAL) BY MOUTH IN THE MORNING. HOLD IF SYSTOLIC BLOOD PRESSURE (TOP BLOOD PRESSURE NUMBER) LESS THAN 100 MMHG OR HEART RATE LESS THAN 60 BPM (PULSE). 90 tablet 3   Multiple Vitamin (MULTIVITAMIN WITH MINERALS) TABS tablet Take 1 tablet by mouth daily.     multivitamin-lutein (OCUVITE-LUTEIN) CAPS capsule Take 1 capsule by mouth daily.     multivitamin-lutein (OCUVITE-LUTEIN) CAPS capsule Take 1 capsule by mouth daily.     nitroGLYCERIN (NITROSTAT) 0.4 MG SL tablet Place 1 tablet (0.4 mg total) under the tongue every 5 (five) minutes as needed for chest pain. If you require more than two tablets five minutes apart go to the nearest ER via EMS. 90 tablet 0   OneTouch Delica Lancets 23N MISC Apply topically.     Polyethyl Glycol-Propyl Glycol (ULTRA LUBRICANT EYE DROPS OP) Place 1 drop into both eyes 3 (three) times daily as needed (for dry eyes).      PROAIR HFA 108 (90 Base) MCG/ACT inhaler Inhale 2 puffs into the lungs every 6 (six) hours as needed for wheezing or shortness of breath.      sertraline (ZOLOFT) 50 MG tablet Take 50 mg by mouth daily.      solifenacin (VESICARE) 10 MG tablet Take 10 mg by mouth daily.      SUPER B COMPLEX/C PO Take by mouth.     traZODone (DESYREL) 50 MG tablet Take 50-75 mg by mouth at bedtime as needed for sleep.      Vitamin D, Cholecalciferol, 10 MCG (400  UNIT) CAPS Take by mouth.     No current facility-administered medications for this visit.    REVIEW OF SYSTEMS:   Constitutional: Denies fevers, chills or abnormal night sweats Eyes: Denies blurriness of vision, double vision or watery eyes Ears, nose, mouth, throat, and face: Denies  mucositis or sore throat Respiratory: Denies cough, dyspnea or wheezes Cardiovascular: Denies palpitation, chest discomfort or lower extremity swelling Gastrointestinal:  Denies nausea, heartburn or change in bowel habits Skin: Denies abnormal skin rashes Lymphatics: Denies new lymphadenopathy or easy bruising Neurological:Denies numbness, tingling or new weaknesses Behavioral/Psych: Mood is stable, no new changes  Breast:  Denies any palpable lumps or discharge All other systems were reviewed with the patient and are negative.  PHYSICAL EXAMINATION: ECOG PERFORMANCE STATUS: 1 - Symptomatic but completely ambulatory  Vitals:   05/31/21 1537  BP: (!) 146/58  Pulse: 75  Resp: 18  Temp: 98.5 F (36.9 C)  SpO2: 100%   Filed Weights   05/31/21 1537  Weight: 199 lb 9.6 oz (90.5 kg)    LABORATORY DATA:  I have reviewed the data as listed Lab Results  Component Value Date   WBC 4.8 02/09/2020   HGB 13.1 02/09/2020   HCT 41.6 02/09/2020   MCV 90.6 02/09/2020   PLT 282 02/09/2020   Lab Results  Component Value Date   NA 137 10/13/2020   K 4.5 10/13/2020   CL 97 10/13/2020   CO2 22 10/13/2020    RADIOGRAPHIC STUDIES: I have personally reviewed the radiological reports and agreed with the findings in the report.  ASSESSMENT AND PLAN:  Malignant neoplasm of upper-outer quadrant of left breast in female, estrogen receptor negative ([place]) 2006: 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 grade 3 status post neo adjuvant chemotherapy with FEC 4 followed by Taxotere 4 completed 07/20/2005 status post lumpectomy 1.2 cm, grade 3, triple negative, Ki-67 31%, status post radiation completed 11/13/2005  2009: 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%, PR 98% status post lumpectomy radiation and 5 years of tamoxifen completed 04/16/2009  Recurrence: 05/23/2021: Screening mammogram detected right breast mass 10 o'clock position 1.2 cm, biopsy with grade 3 IDC ER 20%, PR 0%, HER2  positive, Ki-67 5%  Pathology and radiology counseling: Discussed with the patient, the details of pathology including the type of breast cancer,the clinical staging, the significance of ER, PR and HER-2/neu receptors and the implications for treatment. After reviewing the pathology in detail, we proceeded to discuss the different treatment options between surgery, radiation, chemotherapy, antiestrogen therapies.  Recommendation: 1.  Bilateral mastectomies with reconstruction and sentinel lymph node biopsy in the right 2. adjuvant chemotherapy with Taxol Herceptin followed by Herceptin maintenance for 1 year 3.  Adjuvant antiestrogen therapy  Prior BRCA2 gene mutation Return to clinic after surgery to start chemotherapy.   All questions were answered. The patient knows to call the clinic with any problems, questions or concerns.   Rulon Eisenmenger, MD, MPH 05/31/2021    I, Thana Ates, am acting as scribe for Nicholas Lose, MD.  I have reviewed the above documentation for accuracy and completeness, and I agree with the above.

## 2021-05-31 ENCOUNTER — Encounter: Payer: Self-pay | Admitting: *Deleted

## 2021-05-31 ENCOUNTER — Inpatient Hospital Stay: Payer: Medicare Other | Attending: Hematology and Oncology | Admitting: Hematology and Oncology

## 2021-05-31 ENCOUNTER — Other Ambulatory Visit: Payer: Self-pay

## 2021-05-31 DIAGNOSIS — Z1501 Genetic susceptibility to malignant neoplasm of breast: Secondary | ICD-10-CM | POA: Diagnosis not present

## 2021-05-31 DIAGNOSIS — Z9221 Personal history of antineoplastic chemotherapy: Secondary | ICD-10-CM | POA: Diagnosis not present

## 2021-05-31 DIAGNOSIS — Z171 Estrogen receptor negative status [ER-]: Secondary | ICD-10-CM

## 2021-05-31 DIAGNOSIS — C50412 Malignant neoplasm of upper-outer quadrant of left female breast: Secondary | ICD-10-CM

## 2021-05-31 DIAGNOSIS — C50411 Malignant neoplasm of upper-outer quadrant of right female breast: Secondary | ICD-10-CM | POA: Diagnosis not present

## 2021-05-31 DIAGNOSIS — Z853 Personal history of malignant neoplasm of breast: Secondary | ICD-10-CM | POA: Insufficient documentation

## 2021-05-31 DIAGNOSIS — Z803 Family history of malignant neoplasm of breast: Secondary | ICD-10-CM | POA: Insufficient documentation

## 2021-05-31 DIAGNOSIS — Z923 Personal history of irradiation: Secondary | ICD-10-CM | POA: Diagnosis not present

## 2021-05-31 NOTE — Assessment & Plan Note (Signed)
2006: 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 grade 3 status post neo adjuvant chemotherapy with FEC 4 followed by Taxotere 4 completed 07/20/2005 status post lumpectomy 1.2 cm, grade 3, triple negative, Ki-67 31%, status post radiation completed 11/13/2005  2009: 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%, PR 98% status post lumpectomy radiation and 5 years of tamoxifen completed 04/16/2009  Recurrence: 05/23/2021: Screening mammogram detected right breast mass 10 o'clock position 1.2 cm, biopsy with grade 3 IDC ER 20%, PR 0%, HER2 positive, Ki-67 5%  Pathology and radiology counseling: Discussed with the patient, the details of pathology including the type of breast cancer,the clinical staging, the significance of ER, PR and HER-2/neu receptors and the implications for treatment. After reviewing the pathology in detail, we proceeded to discuss the different treatment options between surgery, radiation, chemotherapy, antiestrogen therapies.  Recommendation: 1.  Bilateral mastectomies with reconstruction and sentinel lymph node biopsy in the right 2. adjuvant chemotherapy with Taxol Herceptin followed by Herceptin maintenance for 1 year 3.  Adjuvant antiestrogen therapy  Prior BRCA2 gene mutation Return to clinic after surgery to start chemotherapy.

## 2021-06-02 ENCOUNTER — Encounter: Payer: Self-pay | Admitting: *Deleted

## 2021-06-08 ENCOUNTER — Telehealth: Payer: Self-pay | Admitting: Licensed Clinical Social Worker

## 2021-06-08 ENCOUNTER — Encounter: Payer: Self-pay | Admitting: Plastic Surgery

## 2021-06-08 ENCOUNTER — Other Ambulatory Visit: Payer: Self-pay

## 2021-06-08 ENCOUNTER — Ambulatory Visit (INDEPENDENT_AMBULATORY_CARE_PROVIDER_SITE_OTHER): Payer: Medicare Other | Admitting: Plastic Surgery

## 2021-06-08 VITALS — BP 108/63 | HR 86 | Ht 67.0 in | Wt 197.6 lb

## 2021-06-08 DIAGNOSIS — D0511 Intraductal carcinoma in situ of right breast: Secondary | ICD-10-CM | POA: Diagnosis not present

## 2021-06-08 NOTE — Progress Notes (Signed)
Referring Provider [name], MD [address] [place],  [place] [address]   CC:  Chief Complaint  Patient presents with   Advice Only      Jordan Willis is an 70 y.o. female.  HPI: Patient presents to discuss breast reconstruction.  She initially had lumpectomy and radiation on the left side in 2006.  Subsequent to that she had lumpectomy and radiation on the right side in 2009.  She now has a new cancer in the lateral aspect of the right breast.  She is planning bilateral mastectomies.  She has met with [name].  She does not smoke but she is a diabetic with her most recent hemoglobin A1c being 7.1.  She is uncertain whether or not she would prefer autologous or implant-based reconstruction.  Allergies  Allergen Reactions   Tape Rash    Outpatient Encounter Medications as of 06/08/2021  Medication Sig   acetaminophen (TYLENOL) 325 MG tablet Take 650 mg by mouth every 6 (six) hours as needed.   aspirin 81 MG EC tablet Take 81 mg by mouth every evening. Swallow whole.   atorvastatin (LIPITOR) 20 MG tablet TAKE 1 TABLET BY MOUTH EVERYDAY AT BEDTIME   cetirizine (ZYRTEC) 10 MG tablet Take 10 mg by mouth daily.   Cholecalciferol (VITAMIN D3) 2000 units TABS Take 2,000 Units by mouth daily.   Cyanocobalamin (B-12) 5000 MCG CAPS Take 5,000 Units by mouth daily.   Emollient (COLLAGEN) CREA Apply topically.   ENTRESTO 49-51 MG TAKE 1 TABLET BY MOUTH TWICE A DAY   fexofenadine (ALLEGRA) 180 MG tablet Take 180 mg by mouth daily.   fluticasone (FLONASE) 50 MCG/ACT nasal spray SPRAY 2 SPRAYS INTO EACH NOSTRIL EVERY DAY   GLUCOSAMINE-CHONDROITIN PO Take 1 tablet by mouth every evening.    glucose blood (ONETOUCH VERIO) test strip TEST ONCE DAILY AS DIRECTED 90   metFORMIN (GLUCOPHAGE) 500 MG tablet Take 500 mg by mouth 2 (two) times daily with a meal.   Multiple Vitamin (MULTIVITAMIN WITH MINERALS) TABS tablet Take 1 tablet by mouth daily.   multivitamin-lutein  (OCUVITE-LUTEIN) CAPS capsule Take 1 capsule by mouth daily.   multivitamin-lutein (OCUVITE-LUTEIN) CAPS capsule Take 1 capsule by mouth daily.   nitroGLYCERIN (NITROSTAT) 0.4 MG SL tablet Place under the tongue.   OneTouch Delica Lancets 96E MISC Apply topically.   Polyethyl Glycol-Propyl Glycol (ULTRA LUBRICANT EYE DROPS OP) Place 1 drop into both eyes 3 (three) times daily as needed (for dry eyes).    PROAIR HFA 108 (90 Base) MCG/ACT inhaler Inhale 2 puffs into the lungs every 6 (six) hours as needed for wheezing or shortness of breath.    sertraline (ZOLOFT) 50 MG tablet Take 50 mg by mouth daily.    solifenacin (VESICARE) 10 MG tablet Take 10 mg by mouth daily.    SUPER B COMPLEX/C PO Take by mouth.   traZODone (DESYREL) 50 MG tablet Take 50-75 mg by mouth at bedtime as needed for sleep.    Vitamin D, Cholecalciferol, 10 MCG (400 UNIT) CAPS Take by mouth.   metoprolol succinate (TOPROL-XL) 25 MG 24 hr tablet TAKE 1 TABLET (25 MG TOTAL) BY MOUTH IN THE MORNING. HOLD IF SYSTOLIC BLOOD PRESSURE (TOP BLOOD PRESSURE NUMBER) LESS THAN 100 MMHG OR HEART RATE LESS THAN 60 BPM (PULSE).   [DISCONTINUED] nitroGLYCERIN (NITROSTAT) 0.4 MG SL tablet Place 1 tablet (0.4 mg total) under the tongue every 5 (five) minutes as needed for chest pain. If you require more than  two tablets five minutes apart go to the nearest ER via EMS.   No facility-administered encounter medications on file as of 06/08/2021.     Past Medical History:  Diagnosis Date   Anxiety    Arthritis    Lumbar spine DDD.   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   Diabetes mellitus without complication (HCC)    Diet controlled   Dyspnea    When patient gets sick uses Pro-Air inhaler   Frequent sinus infections    GERD (gastroesophageal reflux disease)     occ   Goiter    History of cardiomegaly    History of cataract    Bilateral   History of kidney stones    08/28/2018 currently has a large kidney stone, asymptomatic at this time   History of migraine    History of uterine fibroid    History of uterine prolapse    Hyperlipidemia    Hypertension    LBBB (left bundle branch block)    Menopause 03/03/2012   Nasal turbinate hypertrophy 11/29/2016   Left inferior    Personal history of chemotherapy    Personal history of radiation therapy    PONV (postoperative nausea and vomiting)    Sinusitis 2014   Sleep apnea    No CPAP   SUI (stress urinary incontinence, female)    SVD (spontaneous vaginal delivery)    x 2   Thyroid nodule     Past Surgical History:  Procedure Laterality Date   BILATERAL SALPINGOOPHORECTOMY Bilateral    BLADDER SUSPENSION N/A 05/16/2017   Procedure: TRANSVAGINAL TAPE (TVT) PROCEDURE;  Surgeon: Everett Graff, MD;  Location: [facility];  Service: Gynecology;  Laterality: N/A;   BREAST BIOPSY Right 2009   BREAST LUMPECTOMY Right 2009   BREAST LUMPECTOMY Left 2006   CARDIAC CATHETERIZATION     CATARACT EXTRACTION Left 10/23/2017   CATARACT EXTRACTION Right 11/06/2017   COLONOSCOPY  10/22/2009   Normal.  Repeat 5 years.  [name]   CYSTOSCOPY N/A 05/16/2017   Procedure: CYSTOSCOPY;  Surgeon: Everett Graff, MD;  Location: [facility];  Service: Gynecology;  Laterality: N/A;   LEFT HEART CATH AND CORONARY ANGIOGRAPHY N/A 02/09/2020   Procedure: LEFT HEART CATH AND CORONARY ANGIOGRAPHY;  Surgeon: Nigel Mormon, MD;  Location: [facility];  Service: Cardiovascular;  Laterality: N/A;   ROBOTIC ASSISTED TOTAL HYSTERECTOMY N/A 05/16/2017   Procedure: ROBOTIC ASSISTED TOTAL HYSTERECTOMY;  Surgeon: Delsa Bern, MD;  Location: [facility];  Service: Gynecology;  Laterality: N/A;   ROTATOR CUFF REPAIR Bilateral    SHOULDER ARTHROSCOPY WITH ROTATOR CUFF REPAIR AND SUBACROMIAL DECOMPRESSION Right 09/03/2018   Procedure:  Right shoulder manipulation under anesthesia, exam under anesthesia, mini open rotator cuff repair, subacromial decompression;  Surgeon: Susa Day, MD;  Location: [facility];  Service: Orthopedics;  Laterality: Right;  90 mins   TUBAL LIGATION     WISDOM TOOTH EXTRACTION      Family History  Problem Relation Age of Onset   Cancer Mother 24       breast cancer   Hypertension Mother    Diabetes Father    Cancer Sister        breast cancer   Breast cancer Sister    Cancer Sister  breast cancer   Breast cancer Sister     Social History   Social History Narrative   Marital status: divorced; dating seriously x many years.       Children:  2 children; 1 grandchild.      Employment: Personnel officer.      Lives: alone      Tobacco: none       Alcohol: none      Exercise:  Walking with sister two days per week.      Seatbelt: 100%      Guns: none      Right-handed   Caffeine: occasional coffee, hot tea or green tea a few times per week     Review of Systems General: Denies fevers, chills, weight loss CV: Denies chest pain, shortness of breath, palpitations  Physical Exam Vitals with BMI 06/08/2021 05/31/2021 05/26/2021  Height 5' 7" 5' 7" 5' 7"  Weight 197 lbs 10 oz 199 lbs 10 oz 200 lbs  BMI 30.94 82.99 37.16  Systolic 967 893 810  Diastolic 63 58 73  Pulse 86 75 81    General:  No acute distress,  Alert and oriented, Non-Toxic, Normal speech and affect Breast: She has lumpectomy scars on both sides.  She also has a scar from port placement.  She has mild radiation changes on both sides.  There are some asymmetries in shape and size.  She looks to be around a D cup.  Base width is 11.5 cm.  Assessment/Plan Patient presents to discuss breast reconstruction after planned upcoming mastectomy.  Patient asked about nipple sparing mastectomy.  I am uncertain whether or not she would be a good candidate for that from a cancer standpoint.  From a reconstructive standpoint  I do think there would be some risk and that given history of radiation on both sides.  I ultimately recommended skin sparing mastectomy in her case.  I did discuss autologous reconstruction with her.  She has a friend who had abdominal tissue utilized for their reconstruction.  I did explain that with a bilateral procedure free tissue transfer would probably be the best way to do this and that I do not perform that procedure.  I did advise her however that placing a tissue expander at the time of her cancer surgery would allow her cancer to be removed and preserve all future reconstructive options.  That has the most appeal to her.  I did a explained the implant-based reconstruction process and how the expander would be gradually inflated and switched out to a gel implant.  I did explain that she has a number of risk factors such as history of radiation and diabetes.  This would increase her likelihood of wound healing complications and ultimately failure of her implant-based reconstruction.  She is accepting of these risks and would like to shoot for immediate reconstruction with tissue expanders and then think more about how to move forward with her reconstruction thereafter.  I think this is a reasonable plan.  We went through the risks of the procedure that include bleeding, infection, damage to surrounding structures and need for additional procedures.  We will plan to be available to assist with immediate reconstruction if she chooses to go that route.  [name] 06/08/2021, 5:46 PM

## 2021-06-08 NOTE — Telephone Encounter (Signed)
CSW able to speak with patient briefly about support services available. E-mailed monthly support program calendar and added to the mailing list to receive future calendars. Patient stated she will call back with any questions.   Christeen Douglas, LCSW

## 2021-06-08 NOTE — Telephone Encounter (Signed)
[facility] Work  Clinical Social Work was referred by new patient protocol for assessment of psychosocial needs.  Clinical Social Worker  attempted to contact patient by phone   to offer support and assess for needs.    Tried home number- daughter answered and suggested calling cell as patient not at home at the moment. Attempted to call pt cell phone- no answer. Left VM with direct contact information.     [place], [place] Worker [employer]

## 2021-06-12 ENCOUNTER — Encounter: Payer: Self-pay | Admitting: *Deleted

## 2021-06-13 ENCOUNTER — Other Ambulatory Visit (HOSPITAL_COMMUNITY): Payer: Medicare Other

## 2021-06-13 ENCOUNTER — Telehealth: Payer: Self-pay

## 2021-06-13 NOTE — Telephone Encounter (Signed)
Pt called and states she is experiencing pain to her (R) Br. But has recently heard from surgeon who is calling in medication. Pt verbalizes thanks for call back

## 2021-06-15 ENCOUNTER — Ambulatory Visit (HOSPITAL_COMMUNITY)
Admission: RE | Admit: 2021-06-15 | Discharge: 2021-06-15 | Disposition: A | Discharge status: Home / Self Care | Payer: Medicare Other | Source: Ambulatory Visit | Attending: Hematology and Oncology | Admitting: Hematology and Oncology

## 2021-06-15 ENCOUNTER — Telehealth: Payer: Self-pay | Admitting: Hematology and Oncology

## 2021-06-15 ENCOUNTER — Other Ambulatory Visit: Payer: Self-pay

## 2021-06-15 DIAGNOSIS — E119 Type 2 diabetes mellitus without complications: Secondary | ICD-10-CM | POA: Insufficient documentation

## 2021-06-15 DIAGNOSIS — I447 Left bundle-branch block, unspecified: Secondary | ICD-10-CM | POA: Diagnosis not present

## 2021-06-15 DIAGNOSIS — E785 Hyperlipidemia, unspecified: Secondary | ICD-10-CM | POA: Insufficient documentation

## 2021-06-15 DIAGNOSIS — I081 Rheumatic disorders of both mitral and tricuspid valves: Secondary | ICD-10-CM | POA: Insufficient documentation

## 2021-06-15 DIAGNOSIS — Z0189 Encounter for other specified special examinations: Secondary | ICD-10-CM

## 2021-06-15 DIAGNOSIS — I429 Cardiomyopathy, unspecified: Secondary | ICD-10-CM | POA: Diagnosis not present

## 2021-06-15 DIAGNOSIS — I700 Atherosclerosis of aorta: Secondary | ICD-10-CM | POA: Diagnosis not present

## 2021-06-15 DIAGNOSIS — Z171 Estrogen receptor negative status [ER-]: Secondary | ICD-10-CM | POA: Diagnosis not present

## 2021-06-15 DIAGNOSIS — C50412 Malignant neoplasm of upper-outer quadrant of left female breast: Secondary | ICD-10-CM

## 2021-06-15 DIAGNOSIS — I10 Essential (primary) hypertension: Secondary | ICD-10-CM | POA: Insufficient documentation

## 2021-06-15 LAB — ECHOCARDIOGRAM COMPLETE
AR max vel: 1.76 cm2
AV Area VTI: 1.79 cm2
AV Area mean vel: 1.65 cm2
AV Mean grad: 4 mmHg
AV Peak grad: 7.2 mmHg
Ao pk vel: 1.34 m/s
Area-P 1/2: 3.46 cm2
Calc EF: 52.3 %
MV M vel: 4.87 m/s
MV Peak grad: 94.9 mmHg
MV VTI: 1.58 cm2
Radius: 0.2 cm
S' Lateral: 3.9 cm
Single Plane A2C EF: 56.9 %
Single Plane A4C EF: 47.8 %

## 2021-06-15 NOTE — Telephone Encounter (Signed)
I discussed with the patient that her echocardiogram showed a low ejection fraction of 30 to 35%. I sent a message to Dr. Alice Reichert her cardiologist regarding this issue. If her ejection fraction is so low we cannot do Herceptin treatments. We may have to simply treat her with Taxol alone.

## 2021-06-15 NOTE — Progress Notes (Signed)
*  PRELIMINARY RESULTS* Echocardiogram 2D Echocardiogram has been performed.  Luisa Hart RDCS 06/15/2021, 9:49 AM

## 2021-06-16 ENCOUNTER — Encounter: Payer: Self-pay | Admitting: *Deleted

## 2021-06-16 ENCOUNTER — Other Ambulatory Visit: Payer: Self-pay | Admitting: *Deleted

## 2021-06-16 DIAGNOSIS — Z171 Estrogen receptor negative status [ER-]: Secondary | ICD-10-CM

## 2021-06-16 DIAGNOSIS — C50412 Malignant neoplasm of upper-outer quadrant of left female breast: Secondary | ICD-10-CM

## 2021-06-20 ENCOUNTER — Telehealth: Payer: Self-pay | Admitting: Hematology and Oncology

## 2021-06-20 NOTE — Telephone Encounter (Signed)
Scheduled appt per 8/26 sch msg. Called pt, no answer and mailbox was full. Mailed updated calendar to pt.

## 2021-06-21 ENCOUNTER — Other Ambulatory Visit: Payer: Self-pay | Admitting: *Deleted

## 2021-06-21 ENCOUNTER — Encounter: Payer: Self-pay | Admitting: *Deleted

## 2021-06-21 MED ORDER — LETROZOLE 2.5 MG PO TABS: 2.5000 mg | ORAL_TABLET | Freq: Every day | ORAL | 6 refills | Status: DC

## 2021-06-21 NOTE — Progress Notes (Signed)
Left vm with instructions on starting Letrozole per [name] d/t sx scheduled out in October. Contact information provided for questions or needs.

## 2021-06-22 ENCOUNTER — Telehealth: Payer: Self-pay | Admitting: *Deleted

## 2021-06-22 NOTE — Telephone Encounter (Signed)
Received call from pt requesting apt next week with MD to discuss treatment in more detail.  Pt states she attended her first apt with MD alone and is very confused on the plan of care and would like her daughter to be present to speak with the provider.   RN offered for MD to call pt daughter, pt declined and states she would prefer an in office visit. Apt scheduled and pt verbalized understanding of date and time.

## 2021-06-27 ENCOUNTER — Telehealth: Payer: Self-pay | Admitting: *Deleted

## 2021-06-27 NOTE — Progress Notes (Signed)
 Patient Care Team: Pharr, Walter, MD as PCP - General (Internal Medicine) Rivard, Sandra, MD as Attending Physician (Obstetrics and Gynecology) Hopper, David H, MD (Inactive) as Consulting Physician (Family Medicine) Martini, Keisha N, RN as Oncology Nurse Navigator Stuart, Dawn C, RN as Oncology Nurse Navigator  DIAGNOSIS:    ICD-10-CM   1. Malignant neoplasm of upper-outer quadrant of left breast in female, estrogen receptor negative (HCC)  C50.412    Z17.1       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%, PR 0%, HER-2 negative, Ki-67 31%   09/26/2005 - 11/13/2005 Radiation Therapy   Adjuvant radiation therapy   03/18/2008 Initial Biopsy   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   03/22/2008 Surgery   Right breast lumpectomy 0.9 cm DCIS ER 90% PR 98% stage 0   04/11/2008 Procedure   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   05/10/2008 - 07/02/2008 Radiation Therapy   Adjuvant radiation therapy   07/17/2008 - 04/16/2009 Anti-estrogen oral therapy   Femara then switched to tamoxifen which was discontinued in 2010 due to concern about uterine cancer   05/23/2021 Relapse/Recurrence   Screening detected right breast mass at 10 o'clock position measuring 1.2 cm, ultrasound-guided biopsy revealed grade 3 IDC ER 20%, PR 0%, HER2 positive 3+, Ki-67 50%   05/31/2021 Cancer Staging    Staging form: Breast, AJCC 7th Edition - Clinical: Stage IA (rT1c, N0, M0) - Signed by Gudena, Vinay, MD on 05/31/2021 Stage prefix: Recurrence Laterality: Right Biopsy of metastatic site performed: No Estrogen receptor status: Positive Progesterone receptor status: Negative HER2 status: Positive     CHIEF COMPLIANT: Follow-up of right breast cancer  INTERVAL HISTORY: Jordan Willis is a 69 y.o. with above-mentioned history of right breast cancer. She is scheduled for a right mastectomy with Dr. Faera Byerly on 08/08/2021. She presents to the clinic today for follow-up.  Patient is was confused about the treatment plan and wanted to have another appointment to discuss it with us.  She is accompanied today by her son.  ALLERGIES:  is allergic to tape.  MEDICATIONS:  Current Outpatient Medications  Medication Sig Dispense Refill   acetaminophen (TYLENOL) 325 MG tablet Take 650 mg by mouth every 6 (six) hours as needed.     aspirin 81 MG EC tablet Take 81 mg by mouth every evening. Swallow whole.     atorvastatin (LIPITOR) 20 MG tablet TAKE 1 TABLET BY MOUTH EVERYDAY AT BEDTIME 90 tablet 1   cetirizine (ZYRTEC) 10 MG tablet Take 10 mg by mouth daily.     Cholecalciferol (VITAMIN D3) 2000 units TABS Take 2,000 Units by mouth daily.     Cyanocobalamin (B-12) 5000 MCG CAPS Take 5,000 Units by mouth daily.     Emollient (COLLAGEN) CREA Apply topically.     ENTRESTO 49-51 MG TAKE 1 TABLET BY MOUTH TWICE A DAY 180 tablet   0   fexofenadine (ALLEGRA) 180 MG tablet Take 180 mg by mouth daily.     fluticasone (FLONASE) 50 MCG/ACT nasal spray SPRAY 2 SPRAYS INTO EACH NOSTRIL EVERY DAY     GLUCOSAMINE-CHONDROITIN PO Take 1 tablet by mouth every evening.      glucose blood (ONETOUCH VERIO) test strip TEST ONCE DAILY AS DIRECTED 90     letrozole (FEMARA) 2.5 MG tablet Take 1 tablet (2.5 mg total) by mouth daily. 30 tablet 6   metFORMIN (GLUCOPHAGE) 500 MG tablet Take 500 mg by mouth 2 (two) times  daily with a meal.     metoprolol succinate (TOPROL-XL) 25 MG 24 hr tablet TAKE 1 TABLET (25 MG TOTAL) BY MOUTH IN THE MORNING. HOLD IF SYSTOLIC BLOOD PRESSURE (TOP BLOOD PRESSURE NUMBER) LESS THAN 100 MMHG OR HEART RATE LESS THAN 60 BPM (PULSE). 90 tablet 3   Multiple Vitamin (MULTIVITAMIN WITH MINERALS) TABS tablet Take 1 tablet by mouth daily.     multivitamin-lutein (OCUVITE-LUTEIN) CAPS capsule Take 1 capsule by mouth daily.     multivitamin-lutein (OCUVITE-LUTEIN) CAPS capsule Take 1 capsule by mouth daily.     nitroGLYCERIN (NITROSTAT) 0.4 MG SL tablet Place under the tongue.     OneTouch Delica Lancets 33G MISC Apply topically.     Polyethyl Glycol-Propyl Glycol (ULTRA LUBRICANT EYE DROPS OP) Place 1 drop into both eyes 3 (three) times daily as needed (for dry eyes).      PROAIR HFA 108 (90 Base) MCG/ACT inhaler Inhale 2 puffs into the lungs every 6 (six) hours as needed for wheezing or shortness of breath.      sertraline (ZOLOFT) 50 MG tablet Take 50 mg by mouth daily.      solifenacin (VESICARE) 10 MG tablet Take 10 mg by mouth daily.      SUPER B COMPLEX/C PO Take by mouth.     traZODone (DESYREL) 50 MG tablet Take 50-75 mg by mouth at bedtime as needed for sleep.      Vitamin D, Cholecalciferol, 10 MCG (400 UNIT) CAPS Take by mouth.     No current facility-administered medications for this visit.    PHYSICAL EXAMINATION: ECOG PERFORMANCE STATUS: 1 - Symptomatic but completely ambulatory  There were no vitals filed for this visit. There were no vitals filed for this visit.  BREAST: No palpable masses or nodules in either right or left breasts. No palpable axillary supraclavicular or infraclavicular adenopathy no breast tenderness or nipple discharge. (exam performed in the presence of a chaperone)  LABORATORY DATA:  I have reviewed the data as listed CMP Latest Ref Rng & Units 10/13/2020 04/08/2020 03/24/2020  Glucose 65 - 99 mg/dL 101(H) 82 110(H)  BUN 8 - 27 mg/dL 11 11 14   Creatinine 0.57 - 1.00 mg/dL 0.55(L) 0.62 0.59  Sodium 134 - 144 mmol/L 137 143 139  Potassium 3.5 - 5.2 mmol/L 4.5 4.4 4.4  Chloride 96 - 106 mmol/L 97 105 101  CO2 20 - 29 mmol/L 22 25 24  Calcium 8.7 - 10.3 mg/dL 10.2 10.3 10.4(H)  Total Protein 6.4 - 8.3 g/dL - - -  Total Bilirubin 0.20 - 1.20 mg/dL - - -  Alkaline Phos 40 - 150 U/L - - -  AST 5 - 34 U/L - - -  ALT 0 - 55 U/L - - -    Lab Results  Component Value Date   WBC 4.8 02/09/2020   HGB 13.1 02/09/2020   HCT 41.6 02/09/2020   MCV 90.6   02/09/2020   PLT 282 02/09/2020   NEUTROABS 2.8 10/06/2013    ASSESSMENT & PLAN:  Malignant neoplasm of upper-outer quadrant of left breast in female, estrogen receptor negative (HCC) 2006: 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  Bilateral mastectomies with reconstruction and sentinel lymph node biopsy in the right 2. adjuvant chemotherapy with Taxol Herceptin followed by Herceptin maintenance for 1 year (echocardiogram showed an ejection fraction of 30-35% with LV function severely decreased.)  Therefore our plan is to hold off on any systemic treatment until the heart function improves and then at that time we can consider giving subcutaneous Herceptin.  3.  Adjuvant antiestrogen therapy with letrozole to be started 06/28/2021   Prior BRCA2 gene mutation  Low cardiac ejection fraction: I discussed with the patient that it might be related to prior  Adriamycin chemotherapy.  I do not recommend anti-HER2 therapy for patients with such low ejection fraction.  We discussed that Taxol alone may not have as much benefit and therefore I did not recommend any systemic chemotherapy. If her cardiac ejection fraction improves, then we can give her Herceptin for 1 year. I sent a message to her cardiologist   No orders of the defined types were placed in this encounter.  The patient has a good understanding of the overall plan. she agrees with it. she will call with any problems that may develop before the next visit here.  Total time spent: 20 mins including face to face time and time spent for planning, charting and coordination of care  Vinay K Gudena, MD, MPH 06/28/2021  I, Kirstyn Evans, am acting as scribe for Dr. Vinay Gudena.  I have reviewed the above documentation for accuracy and completeness, and I agree with the above.  Are       

## 2021-06-27 NOTE — Telephone Encounter (Signed)
Received VM from pt.  Attempt x1 to return call.  No answer, LVM to return call to the office.  

## 2021-06-28 ENCOUNTER — Other Ambulatory Visit: Payer: Self-pay

## 2021-06-28 ENCOUNTER — Inpatient Hospital Stay: Payer: Medicare Other | Attending: Hematology and Oncology | Admitting: Hematology and Oncology

## 2021-06-28 DIAGNOSIS — Z1501 Genetic susceptibility to malignant neoplasm of breast: Secondary | ICD-10-CM | POA: Insufficient documentation

## 2021-06-28 DIAGNOSIS — C50411 Malignant neoplasm of upper-outer quadrant of right female breast: Secondary | ICD-10-CM | POA: Insufficient documentation

## 2021-06-28 DIAGNOSIS — Z803 Family history of malignant neoplasm of breast: Secondary | ICD-10-CM | POA: Insufficient documentation

## 2021-06-28 DIAGNOSIS — Z923 Personal history of irradiation: Secondary | ICD-10-CM | POA: Insufficient documentation

## 2021-06-28 DIAGNOSIS — Z9013 Acquired absence of bilateral breasts and nipples: Secondary | ICD-10-CM | POA: Diagnosis not present

## 2021-06-28 DIAGNOSIS — Z171 Estrogen receptor negative status [ER-]: Secondary | ICD-10-CM | POA: Diagnosis not present

## 2021-06-28 DIAGNOSIS — C50412 Malignant neoplasm of upper-outer quadrant of left female breast: Secondary | ICD-10-CM | POA: Diagnosis not present

## 2021-06-28 DIAGNOSIS — Z853 Personal history of malignant neoplasm of breast: Secondary | ICD-10-CM | POA: Diagnosis not present

## 2021-06-28 DIAGNOSIS — Z9221 Personal history of antineoplastic chemotherapy: Secondary | ICD-10-CM | POA: Diagnosis not present

## 2021-06-28 NOTE — Assessment & Plan Note (Signed)
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  Bilateral mastectomies with reconstruction and sentinel lymph node biopsy in the right 2. adjuvant chemotherapy with Taxol Herceptin followed by Herceptin maintenance for 1 year (echocardiogram showed an ejection fraction of 30-35% with LV function severely decreased. 3.  Adjuvant antiestrogen therapy  Prior BRCA2 gene mutation  Low cardiac ejection fraction: I discussed with the patient that it might be related to prior Adriamycin chemotherapy.  I do not recommend anti-HER2 therapy for patients with such low ejection fraction.  We discussed that Taxol alone may not have as much benefit and therefore I did not recommend any systemic chemotherapy.

## 2021-07-05 ENCOUNTER — Telehealth: Payer: Self-pay | Admitting: *Deleted

## 2021-07-05 NOTE — Telephone Encounter (Signed)
Received VM from pt.  Attempt x1 to return call, no answer.  Unable to LVM due to VM being full.

## 2021-07-20 ENCOUNTER — Encounter: Payer: Self-pay | Admitting: Surgical

## 2021-07-20 ENCOUNTER — Ambulatory Visit (INDEPENDENT_AMBULATORY_CARE_PROVIDER_SITE_OTHER): Payer: Medicare Other | Admitting: Surgical

## 2021-07-20 ENCOUNTER — Other Ambulatory Visit: Payer: Self-pay

## 2021-07-20 VITALS — BP 128/76 | HR 72 | Ht 67.0 in | Wt 198.0 lb

## 2021-07-20 DIAGNOSIS — D0511 Intraductal carcinoma in situ of right breast: Secondary | ICD-10-CM

## 2021-07-20 MED ORDER — HYDROCODONE-ACETAMINOPHEN 5-325 MG PO TABS: 1.0000 | ORAL_TABLET | Freq: Four times a day (QID) | ORAL | 0 refills | Status: DC | PRN

## 2021-07-20 MED ORDER — SULFAMETHOXAZOLE-TRIMETHOPRIM 800-160 MG PO TABS: 1.0000 | ORAL_TABLET | Freq: Two times a day (BID) | ORAL | 0 refills | Status: AC

## 2021-07-20 MED ORDER — ONDANSETRON HCL 4 MG PO TABS: 4.0000 mg | ORAL_TABLET | Freq: Three times a day (TID) | ORAL | 0 refills | Status: DC | PRN

## 2021-07-20 NOTE — Progress Notes (Signed)
Patient ID: Jordan Willis, female    DOB: 1950-10-26, 70 y.o.   MRN: [id number]  Chief Complaint  Patient presents with   Pre-op Exam       ICD-10-CM   1. Ductal carcinoma in situ (DCIS) of right breast  D05.11       History of Present Illness: Jordan Willis is a 70 y.o.  female.  She presents for preoperative evaluation for upcoming procedure, immediate bilateral breast reconstruction placement tissue expanders and Flex HD, scheduled for 08/08/2021 with [name] after right mastectomy with sentinel lymph node biopsy and left simple mastectomy by [name].  She presents today with her daughter and son  The patient has not had problems with anesthesia.  She does report some postanesthesia nausea. No history of DVT/PE.  No family history of DVT/PE.  No family or personal history of bleeding or clotting disorders.  She is currently on aspirin 81 mg daily, but reports she stopped this approximately 1 month ago in anticipation of upcoming surgery.  No history of CVA/MI.   Summary of Previous Visit: History of lumpectomy and radiation to the left breast in 2006 and subsequent lumpectomy and radiation to the right breast in 2009.  She does not smoke.  She is diabetic with most recent A1c being 7.1.  PMH Significant for: Hyperlipidemia, diabetes mellitus with most recent A1c 7.1, anxiety, GERD, left bundle branch block, postoperative nausea vomiting, sleep apnea, htn. NICM.  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  Past Medical History: Allergies: Allergies  Allergen Reactions   Tape Rash    Current Medications:  Current Outpatient Medications:    acetaminophen (TYLENOL) 325 MG tablet, Take 650 mg by mouth every 6 (six) hours as needed., Disp: , Rfl:    aspirin 81 MG EC tablet, Take 81 mg by mouth every evening. Swallow whole., Disp: , Rfl:    atorvastatin  (LIPITOR) 20 MG tablet, TAKE 1 TABLET BY MOUTH EVERYDAY AT BEDTIME, Disp: 90 tablet, Rfl: 1   cetirizine (ZYRTEC) 10 MG tablet, Take 10 mg by mouth daily., Disp: , Rfl:    Cholecalciferol (VITAMIN D3) 2000 units TABS, Take 2,000 Units by mouth daily., Disp: , Rfl:    Cyanocobalamin (B-12) 5000 MCG CAPS, Take 5,000 Units by mouth daily., Disp: , Rfl:    Emollient (COLLAGEN) CREA, Apply topically., Disp: , Rfl:    ENTRESTO 49-51 MG, TAKE 1 TABLET BY MOUTH TWICE A DAY, Disp: 180 tablet, Rfl: 0   fexofenadine (ALLEGRA) 180 MG tablet, Take 180 mg by mouth daily., Disp: , Rfl:    fluticasone (FLONASE) 50 MCG/ACT nasal spray, SPRAY 2 SPRAYS INTO EACH NOSTRIL EVERY DAY, Disp: , Rfl:    GLUCOSAMINE-CHONDROITIN PO, Take 1 tablet by mouth every evening. , Disp: , Rfl:    glucose blood (ONETOUCH VERIO) test strip, TEST ONCE DAILY AS DIRECTED 90, Disp: , Rfl:    HYDROcodone-acetaminophen (NORCO) 5-325 MG tablet, Take 1 tablet by mouth every 6 (six) hours as needed for up to 5 days for severe pain., Disp: 20 tablet, Rfl: 0   letrozole (FEMARA) 2.5 MG tablet, Take 1 tablet (2.5 mg total) by mouth daily., Disp: 30 tablet, Rfl: 6   metFORMIN (GLUCOPHAGE) 500 MG tablet, Take 500 mg by mouth 2 (two) times daily with a meal., Disp: , Rfl:    Multiple Vitamin (MULTIVITAMIN WITH MINERALS) TABS  tablet, Take 1 tablet by mouth daily., Disp: , Rfl:    multivitamin-lutein (OCUVITE-LUTEIN) CAPS capsule, Take 1 capsule by mouth daily., Disp: , Rfl:    ondansetron (ZOFRAN) 4 MG tablet, Take 1 tablet (4 mg total) by mouth every 8 (eight) hours as needed for nausea or vomiting., Disp: 20 tablet, Rfl: 0   OneTouch Delica Lancets 52D MISC, Apply topically., Disp: , Rfl:    Polyethyl Glycol-Propyl Glycol (ULTRA LUBRICANT EYE DROPS OP), Place 1 drop into both eyes 3 (three) times daily as needed (for dry eyes). , Disp: , Rfl:    PROAIR HFA 108 (90 Base) MCG/ACT inhaler, Inhale 2 puffs into the lungs every 6 (six) hours as needed for  wheezing or shortness of breath. , Disp: , Rfl:    sertraline (ZOLOFT) 50 MG tablet, Take 50 mg by mouth daily. , Disp: , Rfl:    solifenacin (VESICARE) 10 MG tablet, Take 10 mg by mouth daily. , Disp: , Rfl:    sulfamethoxazole-trimethoprim (BACTRIM DS) 800-160 MG tablet, Take 1 tablet by mouth 2 (two) times daily for 14 days., Disp: 28 tablet, Rfl: 0   SUPER B COMPLEX/C PO, Take by mouth., Disp: , Rfl:    traZODone (DESYREL) 50 MG tablet, Take 50-75 mg by mouth at bedtime as needed for sleep. , Disp: , Rfl:    Vitamin D, Cholecalciferol, 10 MCG (400 UNIT) CAPS, Take by mouth., Disp: , Rfl:    metoprolol succinate (TOPROL-XL) 25 MG 24 hr tablet, TAKE 1 TABLET (25 MG TOTAL) BY MOUTH IN THE MORNING. HOLD IF SYSTOLIC BLOOD PRESSURE (TOP BLOOD PRESSURE NUMBER) LESS THAN 100 MMHG OR HEART RATE LESS THAN 60 BPM (PULSE)., Disp: 90 tablet, Rfl: 3   multivitamin-lutein (OCUVITE-LUTEIN) CAPS capsule, Take 1 capsule by mouth daily., Disp: , Rfl:    nitroGLYCERIN (NITROSTAT) 0.4 MG SL tablet, Place under the tongue. (Patient not taking: Reported on 07/20/2021), Disp: , Rfl:   Past Medical Problems: Past Medical History:  Diagnosis Date   Anxiety    Arthritis    Lumbar spine DDD.   Breast cancer, female, left 03/03/2012   Cardiomyopathy ([place])    Carpal tunnel syndrome    Bilateral, Mild   Cerebral atherosclerosis    Chronic sinusitis    DCIS (ductal carcinoma in situ) of breast, right 03/03/2012   Diabetes mellitus without complication (HCC)    Diet controlled   Dyspnea    When patient gets sick uses Pro-Air inhaler   Frequent sinus infections    GERD (gastroesophageal reflux disease)    occ   Goiter    History of cardiomegaly    History of cataract    Bilateral   History of kidney stones    08/28/2018 currently has a large kidney stone, asymptomatic at this time   History of migraine    History of uterine fibroid    History of uterine prolapse    Hyperlipidemia    Hypertension    LBBB  (left bundle branch block)    Menopause 03/03/2012   Nasal turbinate hypertrophy 11/29/2016   Left inferior    Personal history of chemotherapy    Personal history of radiation therapy    PONV (postoperative nausea and vomiting)    Sinusitis 2014   Sleep apnea    No CPAP   SUI (stress urinary incontinence, female)    SVD (spontaneous vaginal delivery)    x 2   Thyroid nodule     Past Surgical History: Past Surgical History:  Procedure Laterality Date   BILATERAL SALPINGOOPHORECTOMY Bilateral    BLADDER SUSPENSION N/A 05/16/2017   Procedure: TRANSVAGINAL TAPE (TVT) PROCEDURE;  Surgeon: Everett Graff, MD;  Location: [facility];  Service: Gynecology;  Laterality: N/A;   BREAST BIOPSY Right 2009   BREAST LUMPECTOMY Right 2009   BREAST LUMPECTOMY Left 2006   CARDIAC CATHETERIZATION     CATARACT EXTRACTION Left 10/23/2017   CATARACT EXTRACTION Right 11/06/2017   COLONOSCOPY  10/22/2009   Normal.  Repeat 5 years.  [name]   CYSTOSCOPY N/A 05/16/2017   Procedure: CYSTOSCOPY;  Surgeon: Everett Graff, MD;  Location: [facility];  Service: Gynecology;  Laterality: N/A;   LEFT HEART CATH AND CORONARY ANGIOGRAPHY N/A 02/09/2020   Procedure: LEFT HEART CATH AND CORONARY ANGIOGRAPHY;  Surgeon: Nigel Mormon, MD;  Location: [facility];  Service: Cardiovascular;  Laterality: N/A;   ROBOTIC ASSISTED TOTAL HYSTERECTOMY N/A 05/16/2017   Procedure: ROBOTIC ASSISTED TOTAL HYSTERECTOMY;  Surgeon: Delsa Bern, MD;  Location: [facility];  Service: Gynecology;  Laterality: N/A;   ROTATOR CUFF REPAIR Bilateral    SHOULDER ARTHROSCOPY WITH ROTATOR CUFF REPAIR AND SUBACROMIAL DECOMPRESSION Right 09/03/2018   Procedure: Right shoulder manipulation under anesthesia, exam under anesthesia, mini open rotator cuff repair, subacromial decompression;  Surgeon: Susa Day, MD;  Location: [facility];  Service: Orthopedics;  Laterality: Right;  90 mins   TUBAL LIGATION     WISDOM TOOTH EXTRACTION      Social  History: Social History   Socioeconomic History   Marital status: Divorced    Spouse name: n/a   Number of children: 2   Years of education: 12   Highest education level: Not on file  Occupational History   Occupation: Retired    Fish farm manager: [employer]    Comment: Psychologist, forensic  Tobacco Use   Smoking status: Never   Smokeless tobacco: Never  Vaping Use   Vaping Use: Never used  Substance and Sexual Activity   Alcohol use: No   Drug use: No   Sexual activity: Yes    Partners: Male    Birth control/protection: Post-menopausal, Surgical  Other Topics Concern   Not on file  Social History Narrative   Marital status: divorced; dating seriously x many years.       Children:  2 children; 1 grandchild.      Employment: Personnel officer.      Lives: alone      Tobacco: none       Alcohol: none      Exercise:  Walking with sister two days per week.      Seatbelt: 100%      Guns: none      Right-handed   Caffeine: occasional coffee, hot tea or green tea a few times per week   Social Determinants of Health   Financial Resource Strain: Not on file  Food Insecurity: Not on file  Transportation Needs: Not on file  Physical Activity: Not on file  Stress: Not on file  Social Connections: Not on file  Intimate Partner Violence: Not on file    Family History: Family History  Problem Relation Age of Onset   Cancer Mother 13       breast cancer   Hypertension Mother    Diabetes Father    Cancer Sister        breast cancer   Breast cancer Sister    Cancer Sister        breast cancer   Breast  cancer Sister     Review of Systems: Review of Systems  Constitutional: Negative.   Respiratory: Negative.    Cardiovascular: Negative.   Gastrointestinal: Negative.   Neurological: Negative.    Physical Exam: Vital Signs BP 128/76 (BP Location: Left Arm, Patient Position: Sitting, Cuff Size: Large)   Pulse 72   Ht 5\' 7"  (1.702 m)   Wt 198 lb (89.8 kg)   SpO2 99%    BMI 31.01 kg/m   Physical Exam Constitutional:      General: Not in acute distress.    Appearance: Normal appearance. Not ill-appearing.  HENT:     Head: Normocephalic and atraumatic.  Eyes:     Pupils: Pupils are equal, round Neck:     Musculoskeletal: Normal range of motion.  Cardiovascular:     Rate and Rhythm: Normal rate    Pulses: Normal pulses.  Pulmonary:     Effort: Pulmonary effort is normal. No respiratory distress.  Musculoskeletal: Normal range of motion.  Skin:    General: Skin is warm and dry.     Findings: No erythema or rash.  Neurological:     General: No focal deficit present.     Mental Status: Alert and oriented to person, place, and time. Mental status is at baseline.     Motor: No weakness.  Psychiatric:        Mood and Affect: Mood normal.        Behavior: Behavior normal.    Assessment/Plan: The patient is scheduled for bilateral immediate breast reconstruction with placement of tissue expanders and Flex HD with [name].  Risks, benefits, and alternatives of procedure discussed, questions answered and consent obtained.    Smoking Status: Non-smoker; Counseling Given?  N/A  Caprini Score: 7, high; Risk Factors include: Age, BMI greater than 25, current breast cancer and length of planned surgery. Recommendation for mechanical prophylaxis. Encourage early ambulation.  Will discuss potential need for postoperative Lovenox with [name].  Pictures obtained: @consult   Post-op Rx sent to pharmacy:  Norco, Zofran, Bactrim  Patient was provided with the breast tissue expander and General Surgical Risk consent document and Pain Medication Agreement prior to their appointment.  They had adequate time to read through the risk consent documents and Pain Medication Agreement. We also discussed them in person together during this preop appointment. All of their questions were answered to their satisfaction.  Recommended calling if they have any further  questions.  Risk consent form and Pain Medication Agreement to be scanned into patient's chart.  The risks that can be encountered with and after placement of a breast expander placement were discussed and include the following but not limited to these: bleeding, infection, delayed healing, anesthesia risks, skin sensation changes, injury to structures including nerves, blood vessels, and muscles which may be temporary or permanent, allergies to tape, suture materials and glues, blood products, topical preparations or injected agents, skin contour irregularities, skin discoloration and swelling, deep vein thrombosis, cardiac and pulmonary complications, pain, which may persist, fluid accumulation, wrinkling of the skin over the expander, changes in nipple or breast sensation, expander leakage or rupture, faulty position of the expander, persistent pain, formation of tight scar tissue around the expander (capsular contracture), possible need for revisional surgery or staged procedures.  Discussed patient's increased risk of postoperative complications given her history of bilateral radiation to breasts and diabetes mellitus with recent A1c 7.1.   Electronically signed by: Carola Rhine Torrance Frech, PA-C 07/20/2021 10:40 AM

## 2021-07-31 NOTE — Progress Notes (Signed)
Surgical Instructions    Your procedure is scheduled on Tuesday October 18th.  Report to [facility] Main Entrance "A" at 8:30 A.M., then check in with the Admitting office.  Call this number if you have problems the morning of surgery:  [phone]   If you have any questions prior to your surgery date call [phone]: Open Monday-Friday 8am-4pm    Remember:  Do not eat after midnight the night before your surgery  You may drink clear liquids until 7:30am the morning of your surgery.   Clear liquids allowed are: Water, Non-Citrus Juices (without pulp), Carbonated Beverages, Clear Tea, Black Coffee ONLY (NO MILK, CREAM OR POWDERED CREAMER of any kind), and Gatorade   Take these medicines the morning of surgery with A SIP OF WATER cetirizine (ZYRTEC) 10 MG tablet fluticasone (FLONASE) 50 MCG/ACT nasal spray letrozole (FEMARA) 2.5 MG tablet metoprolol succinate (TOPROL-XL) 25 MG 24 hr tablet Olopatadine HCl (PATADAY) 0.2 % SOLN sertraline (ZOLOFT) 50 MG tablet  IF NEEDED acetaminophen (TYLENOL) 325 MG tablet fexofenadine (ALLEGRA) 180 MG tablet nitroGLYCERIN (NITROSTAT) 0.4 MG SL tablet ondansetron (ZOFRAN) 4 MG tablet Polyethyl Glycol-Propyl Glycol (ULTRA LUBRICANT EYE DROPS OP) PROAIR HFA 108 (90 Base) MCG/ACT inhaler solifenacin (VESICARE) 10 MG tablet traMADol (ULTRAM) 50 MG tablet  Follow your surgeon's instructions on when to stop Aspirin.  If no instructions were given by your surgeon then you will need to call the office to get those instructions.    As of today, STOP taking any Aspirin (unless otherwise instructed by your surgeon) Aleve, Naproxen, Ibuprofen, Motrin, Advil, Goody's, BC's, all herbal medications, fish oil, and all vitamins.  WHAT DO I DO ABOUT MY DIABETES MEDICATION?   Do not take oral diabetes medicines (Metformni) the morning of surgery.  THOW TO MANAGE YOUR DIABETES BEFORE AND AFTER SURGERY  Why is it important to control my blood sugar before  and after surgery? Improving blood sugar levels before and after surgery helps healing and can limit problems. A way of improving blood sugar control is eating a healthy diet by:  Eating less sugar and carbohydrates  Increasing activity/exercise  Talking with your doctor about reaching your blood sugar goals High blood sugars (greater than 180 mg/dL) can raise your risk of infections and slow your recovery, so you will need to focus on controlling your diabetes during the weeks before surgery. Make sure that the doctor who takes care of your diabetes knows about your planned surgery including the date and location.  How do I manage my blood sugar before surgery? Check your blood sugar at least 4 times a day, starting 2 days before surgery, to make sure that the level is not too high or low.  Check your blood sugar the morning of your surgery when you wake up and every 2 hours until you get to the Short Stay unit.  If your blood sugar is less than 70 mg/dL, you will need to treat for low blood sugar: Do not take insulin. Treat a low blood sugar (less than 70 mg/dL) with  cup of clear juice (cranberry or apple), 4 glucose tablets, OR glucose gel. Recheck blood sugar in 15 minutes after treatment (to make sure it is greater than 70 mg/dL). If your blood sugar is not greater than 70 mg/dL on recheck, call [phone] for further instructions. Report your blood sugar to the short stay nurse when you get to Short Stay.  If you are admitted to the hospital after surgery: Your blood sugar will be  checked by the staff and you will probably be given insulin after surgery (instead of oral diabetes medicines) to make sure you have good blood sugar levels. The goal for blood sugar control after surgery is 80-180 mg/dL.    After your COVID test   You are not required to quarantine however you are required to wear a well-fitting mask when you are out and around people not in your household.  If your  mask becomes wet or soiled, replace with a new one.  Wash your hands often with soap and water for 20 seconds or clean your hands with an alcohol-based hand sanitizer that contains at least 60% alcohol.  Do not share personal items.  Notify your provider: if you are in close contact with someone who has COVID  or if you develop a fever of 100.4 or greater, sneezing, cough, sore throat, shortness of breath or body aches.             Do not wear jewelry or makeup Do not wear lotions, powders, perfumes, or deodorant. Do not shave 48 hours prior to surgery.   Do not bring valuables to the hospital. DO Not wear nail polish, gel polish, artificial nails, or any other type of covering on natural nails including finger and toenails. If patients have artificial nails, gel coating, etc. that need to be removed by a nail salon, please have this removed prior to surgery or surgery may need to be canceled/delayed if the surgeon/ anesthesia feels like the patient is unable to be adequately monitored.             [facility] is not responsible for any belongings or valuables.  Do NOT Smoke (Tobacco/Vaping)  24 hours prior to your procedure  If you use a CPAP at night, you may bring your mask for your overnight stay.   Contacts, glasses, hearing aids, dentures or partials may not be worn into surgery, please bring cases for these belongings   For patients admitted to the hospital, discharge time will be determined by your treatment team.   Patients discharged the day of surgery will not be allowed to drive home, and someone needs to stay with them for 24 hours.  NO VISITORS WILL BE ALLOWED IN PRE-OP WHERE PATIENTS ARE PREPPED FOR SURGERY.  ONLY 1 SUPPORT PERSON MAY BE PRESENT IN THE WAITING ROOM WHILE YOU ARE IN SURGERY.  IF YOU ARE TO BE ADMITTED, ONCE YOU ARE IN YOUR ROOM YOU WILL BE ALLOWED TWO (2) VISITORS. 1 (ONE) VISITOR MAY STAY OVERNIGHT BUT MUST ARRIVE TO THE ROOM BY 8pm.  Minor children may  have two parents present. Special consideration for safety and communication needs will be reviewed on a case by case basis.  Special instructions:    Oral Hygiene is also important to reduce your risk of infection.  Remember - BRUSH YOUR TEETH THE MORNING OF SURGERY WITH YOUR REGULAR TOOTHPASTE   [facility]- Preparing For Surgery  Before surgery, you can play an important role. Because skin is not sterile, your skin needs to be as free of germs as possible. You can reduce the number of germs on your skin by washing with CHG (chlorahexidine gluconate) Soap before surgery.  CHG is an antiseptic cleaner which kills germs and bonds with the skin to continue killing germs even after washing.     Please do not use if you have an allergy to CHG or antibacterial soaps. If your skin becomes reddened/irritated stop using the CHG.  Do not shave (including legs and underarms) for at least 48 hours prior to first CHG shower. It is OK to shave your face.  Please follow these instructions carefully.     Shower the NIGHT BEFORE SURGERY and the MORNING OF SURGERY with CHG Soap.   If you chose to wash your hair, wash your hair first as usual with your normal shampoo. After you shampoo, rinse your hair and body thoroughly to remove the shampoo.  Then [employer] and genitals (private parts) with your normal soap and rinse thoroughly to remove soap.  After that Use CHG Soap as you would any other liquid soap. You can apply CHG directly to the skin and wash gently with a scrungie or a clean washcloth.   Apply the CHG Soap to your body ONLY FROM THE NECK DOWN.  Do not use on open wounds or open sores. Avoid contact with your eyes, ears, mouth and genitals (private parts). Wash Face and genitals (private parts)  with your normal soap.   Wash thoroughly, paying special attention to the area where your surgery will be performed.  Thoroughly rinse your body with warm water from the neck down.  DO NOT shower/wash  with your normal soap after using and rinsing off the CHG Soap.  Pat yourself dry with a CLEAN TOWEL.  Wear CLEAN PAJAMAS to bed the night before surgery  Place CLEAN SHEETS on your bed the night before your surgery  DO NOT SLEEP WITH PETS.   Day of Surgery:  Take a shower with CHG soap. Wear Clean/Comfortable clothing the morning of surgery Do not apply any deodorants/lotions.   Remember to brush your teeth WITH YOUR REGULAR TOOTHPASTE.   Please read over the following fact sheets that you were given.

## 2021-08-01 ENCOUNTER — Other Ambulatory Visit: Payer: Self-pay

## 2021-08-01 ENCOUNTER — Encounter (HOSPITAL_COMMUNITY)
Admission: RE | Admit: 2021-08-01 | Discharge: 2021-08-01 | Disposition: A | Discharge status: Home / Self Care | Payer: Medicare Other | Source: Ambulatory Visit | Attending: General Surgery | Admitting: General Surgery

## 2021-08-01 ENCOUNTER — Encounter (HOSPITAL_COMMUNITY): Payer: Self-pay

## 2021-08-01 DIAGNOSIS — Z01818 Encounter for other preprocedural examination: Secondary | ICD-10-CM | POA: Insufficient documentation

## 2021-08-01 DIAGNOSIS — E119 Type 2 diabetes mellitus without complications: Secondary | ICD-10-CM | POA: Diagnosis not present

## 2021-08-01 HISTORY — DX: Anemia, unspecified: D64.9

## 2021-08-01 LAB — BASIC METABOLIC PANEL
Anion gap: 6 (ref 5–15)
BUN: 13 mg/dL (ref 8–23)
CO2: 29 mmol/L (ref 22–32)
Calcium: 9.5 mg/dL (ref 8.9–10.3)
Chloride: 103 mmol/L (ref 98–111)
Creatinine, Ser: 0.56 mg/dL (ref 0.44–1.00)
GFR, Estimated: >60 mL/min (ref >60)
Glucose, Bld: 120 mg/dL — ABNORMAL HIGH (ref 70–99)
Potassium: 3.9 mmol/L (ref 3.5–5.1)
Sodium: 138 mmol/L (ref 135–145)

## 2021-08-01 LAB — CBC
HCT: 39.2 % (ref 36.0–46.0)
Hemoglobin: 12.6 g/dL (ref 12.0–15.0)
MCH: 28.9 pg (ref 26.0–34.0)
MCHC: 32.1 g/dL (ref 30.0–36.0)
MCV: 89.9 fL (ref 80.0–100.0)
Platelets: 299 10*3/uL (ref 150–400)
RBC: 4.36 MIL/uL (ref 3.87–5.11)
RDW: 12.5 % (ref 11.5–15.5)
WBC: 6.9 10*3/uL (ref 4.0–10.5)
nRBC: 0 % (ref 0.0–0.2)

## 2021-08-01 LAB — HEMOGLOBIN A1C
Hgb A1c MFr Bld: 7.2 % — ABNORMAL HIGH (ref 4.8–5.6)
Mean Plasma Glucose: 159.94 mg/dL

## 2021-08-01 LAB — GLUCOSE, CAPILLARY: Glucose-Capillary: 127 mg/dL — ABNORMAL HIGH (ref 70–99)

## 2021-08-01 NOTE — Progress Notes (Addendum)
PCP - Dr. [name] Cardiologist - Dr. Rex Kras [facility]  Chest x-ray - Not indicated  EKG - Not indicated Stress Test -02/03/20  ECHO - 06/15/21 Cardiac Cath - 02/09/20  Sleep Study - Yes long time ago, didn't complete testing   DM - Type II Fasting Blood Sugar - 110-115 Checks Blood Sugar daily CBG at PAT appt 127  Aspirin Instructions: [name] instructed to stop 2 weeks prior  ERAS Protcol - Yes  COVID TEST- 08/04/21 1015   Anesthesia review: Yes cardiac history  Patient denies shortness of breath, fever, cough and chest pain at PAT appointment   All instructions explained to the patient, with a verbal understanding of the material. Patient agrees to go over the instructions while at home for a better understanding. Patient also instructed to wear a mask while in public after being tested for COVID-19. The opportunity to ask questions was provided.  Called Dr. Marlowe Aschoff office to get clarification on the consent. Patient had seen oncologist after seeing [name] and needed the surgical consent updated.

## 2021-08-02 ENCOUNTER — Encounter (HOSPITAL_COMMUNITY): Payer: Self-pay | Admitting: Emergency Medicine

## 2021-08-02 NOTE — Anesthesia Preprocedure Evaluation (Deleted)
Anesthesia Evaluation  Patient identified by MRN, date of birth, ID band Patient awake    Reviewed: Allergy & Precautions, NPO status , Patient's Chart, lab work & pertinent test results, reviewed documented beta blocker date and time   History of Anesthesia Complications (+) PONV and history of anesthetic complications  Airway Mallampati: II  TM Distance: >3 FB Neck ROM: Full    Dental  (+) Dental Advisory Given, Upper Dentures, Poor Dentition, Missing   Pulmonary    Pulmonary exam normal breath sounds clear to auscultation       Cardiovascular hypertension, Pt. on home beta blockers and Pt. on medications + CAD, + Cardiac Stents and + Peripheral Vascular Disease  + dysrhythmias Atrial Fibrillation  Rhythm:Irregular Rate:Abnormal     Neuro/Psych PSYCHIATRIC DISORDERS Anxiety Depression Dementia negative neurological ROS     GI/Hepatic GERD  Medicated,  Endo/Other  diabetes, Well Controlled, Type 2  Renal/GU Renal InsufficiencyRenal disease     Musculoskeletal  (+) Arthritis , LEFT ELBOW FRACTURE WITH LIGAMENT TEAR   Abdominal   Peds  Hematology   Anesthesia Other Findings   Reproductive/Obstetrics                            Anesthesia Physical Anesthesia Plan  ASA: 3  Anesthesia Plan: General   Post-op Pain Management: Regional block* and Tylenol PO (pre-op)*   Induction:   PONV Risk Score and Plan: 4 or greater and Propofol infusion, Dexamethasone and Ondansetron  Airway Management Planned: Oral ETT  Additional Equipment:   Intra-op Plan:   Post-operative Plan: Extubation in OR  Informed Consent: I have reviewed the patients History and Physical, chart, labs and discussed the procedure including the risks, benefits and alternatives for the proposed anesthesia with the patient or authorized representative who has indicated his/her understanding and acceptance.     Dental  advisory given  Plan Discussed with: CRNA  Anesthesia Plan Comments: (See APP note by A. Ermelinda Eckert, FNP )       Anesthesia Quick Evaluation  

## 2021-08-02 NOTE — Progress Notes (Signed)
Anesthesia Chart Review:   Case: [id number] Date/Time: 08/08/21 1015   Procedures:      RIGHT MASTECTOMY WITH RIGHT SENTINEL LYMPH NODE BIOPSY (Right: Breast)     LEFT SIMPLE MASTECTOMY (Left: Breast)     BILATERAL BREAST RECONSTRUCTION WITH PLACEMENT OF TISSUE EXPANDER AND FLEX HD (ACELLULAR HYDRATED DERMIS) (Bilateral)   Anesthesia type: General   Pre-op diagnosis: RECURRENT RIGHT BREAST CANCER   Location: [facility] OR ROOM 02 / [place] [facility]   Surgeons: Stark Klein, MD; [name], MD       DISCUSSION: Pt is 70 years old with hx LBBB, cardiomyopathy (EF 30-35% by 06/15/21 echo), HTN, DM, OSA, anemia, breast cancer  Pt with hx cardiomyopathy that had recovered to EF 50-55% by June 2022 but EF was found to be 30-35% by August 2022 echo.    I spoke with pt by telephone. She reports she has been walking 30 minutes every day without CV symptoms. Denies SOB, peripheral edema.   Reviewed case with Dr. Smith Robert.    VS: BP (!) 150/72   Pulse 68   Temp 36.6 C (Oral)   Resp 18   Ht 5' 7.5" (1.715 m)   Wt 90.4 kg   SpO2 100%   BMI 30.75 kg/m    PROVIDERS: - PCP is [name], MD - Cardiologist is Rex Kras, MD. Last office visit 05/26/21 - Oncologist is Nicholas Lose, MD  LABS: Labs reviewed: Acceptable for surgery. (all labs ordered are listed, but only abnormal results are displayed)  Labs Reviewed  GLUCOSE, CAPILLARY - Abnormal; Notable for the following components:      Result Value   Glucose-Capillary 127 (*)    All other components within normal limits  BASIC METABOLIC PANEL - Abnormal; Notable for the following components:   Glucose, Bld 120 (*)    All other components within normal limits  HEMOGLOBIN A1C - Abnormal; Notable for the following components:   Hgb A1c MFr Bld 7.2 (*)    All other components within normal limits  CBC    EKG 05/26/21: Normal sinus rhythm, 72 bpm, left axis, LAE, left bundle branch block.    CV: Echo 06/15/21:  1. Septal dyssynergy due to LBBB;  severe hypokinesis of the anteroseptal wall and apex; overall moderate to severe LV dysfunction.  2. Left ventricular ejection fraction, by estimation, is 30 to 35%. The left ventricle has moderate to severely decreased function. The left ventricle demonstrates regional wall motion abnormalities (see scoring diagram/findings for description). The left ventricular internal cavity size was mildly dilated. Left ventricular diastolic parameters are consistent with Grade I diastolic dysfunction (impaired relaxation). Elevated left atrial pressure. The average left ventricular global longitudinal strain is -16.7 %. The global longitudinal strain is abnormal.  3. Right ventricular systolic function is normal. The right ventricular size is normal. There is normal pulmonary artery systolic pressure.   4. Left atrial size was mildly dilated.   5. The mitral valve is normal in structure. Mild to moderate mitral valve regurgitation. No evidence of mitral stenosis.   6. The aortic valve is tricuspid. Aortic valve regurgitation is not  visualized. Mild aortic valve sclerosis is present, with no evidence of  aortic valve stenosis.   7. The inferior vena cava is normal in size with greater than 50%  respiratory variability, suggesting right atrial pressure of 3 mmHg.   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   Diabetes mellitus without complication (HCC)    Diet controlled   Dyspnea    When patient gets sick uses Pro-Air  inhaler   Frequent sinus infections    GERD (gastroesophageal reflux disease)    occ   Goiter    History of cardiomegaly    History of cataract    Bilateral   History of kidney stones    08/28/2018 currently has a large kidney stone, asymptomatic at this time   History of migraine    History of uterine fibroid    History of uterine prolapse    Hyperlipidemia    Hypertension    LBBB (left bundle branch block)    Menopause 03/03/2012   Nasal turbinate hypertrophy 11/29/2016   Left inferior    Personal history of chemotherapy    Personal history of radiation therapy    PONV (postoperative nausea and vomiting)    Sinusitis 2014   Sleep apnea    No CPAP   SUI (stress urinary incontinence, female)    SVD (spontaneous vaginal delivery)    x 2   Thyroid nodule     Past Surgical History:  Procedure Laterality Date   BILATERAL SALPINGOOPHORECTOMY Bilateral    BLADDER SUSPENSION N/A 05/16/2017   Procedure: TRANSVAGINAL TAPE (TVT) PROCEDURE;  Surgeon: Everett Graff, MD;  Location: [facility];  Service: Gynecology;  Laterality: N/A;   BREAST BIOPSY Right 2009   BREAST LUMPECTOMY Right 2009   BREAST LUMPECTOMY Left 2006   CARDIAC CATHETERIZATION     CATARACT EXTRACTION Left 10/23/2017   CATARACT EXTRACTION Right 11/06/2017   COLONOSCOPY  10/22/2009   Normal.  Repeat 5 years.  [name]   CYSTOSCOPY N/A 05/16/2017   Procedure: CYSTOSCOPY;  Surgeon: Everett Graff, MD;  Location: [facility];  Service: Gynecology;  Laterality: N/A;   LEFT HEART CATH AND CORONARY ANGIOGRAPHY N/A 02/09/2020   Procedure: LEFT HEART CATH AND CORONARY ANGIOGRAPHY;  Surgeon: Nigel Mormon, MD;  Location: [place] [facility];  Service: Cardiovascular;  Laterality: N/A;   ROBOTIC ASSISTED TOTAL HYSTERECTOMY N/A 05/16/2017   Procedure: ROBOTIC ASSISTED TOTAL HYSTERECTOMY;  Surgeon: Delsa Bern, MD;  Location: [facility];  Service: Gynecology;  Laterality: N/A;   ROTATOR CUFF REPAIR Bilateral    SHOULDER ARTHROSCOPY  WITH ROTATOR CUFF REPAIR AND SUBACROMIAL DECOMPRESSION Right 09/03/2018   Procedure: Right shoulder manipulation under anesthesia, exam under anesthesia, mini open rotator cuff repair, subacromial decompression;  Surgeon: Susa Day, MD;  Location: [facility];  Service: Orthopedics;  Laterality: Right;  90 mins   TUBAL LIGATION     WISDOM TOOTH EXTRACTION      MEDICATIONS:  acetaminophen (TYLENOL) 325 MG tablet   aspirin 81 MG EC tablet   atorvastatin (LIPITOR) 20 MG tablet   cetirizine (ZYRTEC) 10 MG tablet   Cholecalciferol (VITAMIN D3) 2000 units TABS   Cyanocobalamin (B-12) 5000 MCG CAPS   ENTRESTO 49-51 MG   fexofenadine (ALLEGRA) 180 MG tablet   fluticasone (FLONASE) 50 MCG/ACT nasal spray   GLUCOSAMINE-CHONDROITIN PO   glucose blood (ONETOUCH VERIO) test strip  letrozole (FEMARA) 2.5 MG tablet   metFORMIN (GLUCOPHAGE-XR) 500 MG 24 hr tablet   metoprolol succinate (TOPROL-XL) 25 MG 24 hr tablet   Multiple Vitamin (MULTIVITAMIN WITH MINERALS) TABS tablet   multivitamin-lutein (OCUVITE-LUTEIN) CAPS capsule   nitroGLYCERIN (NITROSTAT) 0.4 MG SL tablet   Olopatadine HCl (PATADAY) 0.2 % SOLN   ondansetron (ZOFRAN) 4 MG tablet   OneTouch Delica Lancets 48T MISC   Polyethyl Glycol-Propyl Glycol (ULTRA LUBRICANT EYE DROPS OP)   PROAIR HFA 108 (90 Base) MCG/ACT inhaler   Probiotic Product (PROBIOTIC DAILY PO)   sertraline (ZOLOFT) 50 MG tablet   solifenacin (VESICARE) 10 MG tablet   sulfamethoxazole-trimethoprim (BACTRIM DS) 800-160 MG tablet   SUPER B COMPLEX/C PO   traMADol (ULTRAM) 50 MG tablet   traZODone (DESYREL) 50 MG tablet   No current facility-administered medications for this encounter.    If no changes, I anticipate pt can proceed with surgery as scheduled.   Jordan Cass, PhD, FNP-BC [facility]/Anesthesiology Phone: [phone] 08/02/2021 2:37 PM

## 2021-08-04 ENCOUNTER — Other Ambulatory Visit (HOSPITAL_COMMUNITY)
Admission: RE | Admit: 2021-08-04 | Discharge: 2021-08-04 | Disposition: A | Discharge status: Home / Self Care | Payer: Medicare Other | Source: Ambulatory Visit | Attending: General Surgery | Admitting: General Surgery

## 2021-08-04 DIAGNOSIS — Z20822 Contact with and (suspected) exposure to covid-19: Secondary | ICD-10-CM | POA: Insufficient documentation

## 2021-08-04 DIAGNOSIS — Z01812 Encounter for preprocedural laboratory examination: Secondary | ICD-10-CM | POA: Insufficient documentation

## 2021-08-04 LAB — SARS CORONAVIRUS 2 (TAT 6-24 HRS): SARS Coronavirus 2: NEGATIVE

## 2021-08-08 ENCOUNTER — Ambulatory Visit (HOSPITAL_COMMUNITY): Admission: RE | Admit: 2021-08-08 | Payer: Medicare Other | Source: Home / Self Care | Admitting: General Surgery

## 2021-08-08 ENCOUNTER — Encounter (HOSPITAL_COMMUNITY): Payer: Medicare Other

## 2021-08-08 ENCOUNTER — Encounter (HOSPITAL_COMMUNITY): Admission: RE | Payer: Self-pay | Source: Home / Self Care

## 2021-08-08 SURGERY — MASTECTOMY WITH SENTINEL LYMPH NODE BIOPSY
Anesthesia: General | Site: Breast | Laterality: Right

## 2021-08-09 ENCOUNTER — Encounter: Payer: Self-pay | Admitting: Surgical

## 2021-08-09 NOTE — Progress Notes (Signed)
Surgical Clearance has been received from Dr. Rex Kras  for patient's upcoming surgery with [name] . Medications to hold prior to surgery: Aspirin (Stop taking: 7 days Begin retaking when recommend by surgeon.)   Patient is intermediate risk for perioperative cardiovascular complications.  She is optimized from cardiac standpoint but has nonmodifiable risk factors.

## 2021-08-10 ENCOUNTER — Telehealth: Payer: Self-pay

## 2021-08-10 NOTE — Telephone Encounter (Signed)
Patient called to say that her surgery was scheduled for 08/08/2021, but [name] was unable to perform the surgery that day.  Patient wanted to be sure that we are aware that the surgery did not take place.  Patient would also like to know if she can start taking her vitamins again.  She said that the weather is turning cold and she is concerned about her immune system.  Please call.

## 2021-08-10 NOTE — Telephone Encounter (Signed)
Returned patients call. Advised her [name] is aware that surgery was cancelled. At this time surgery has not been rescheduled. Patient may returned taking vitamins. Once a date has been booked, proceed with the pre-op protocols that she and Matt discussed. Patient understood and agreed with plan.

## 2021-08-15 ENCOUNTER — Inpatient Hospital Stay: Payer: Medicare Other | Admitting: Hematology and Oncology

## 2021-08-15 DIAGNOSIS — Z170 Estrogen receptor positive status [ER+]: Secondary | ICD-10-CM | POA: Diagnosis not present

## 2021-08-15 DIAGNOSIS — C50411 Malignant neoplasm of upper-outer quadrant of right female breast: Secondary | ICD-10-CM | POA: Diagnosis not present

## 2021-08-17 ENCOUNTER — Encounter: Payer: Self-pay | Admitting: *Deleted

## 2021-08-17 ENCOUNTER — Encounter: Payer: Medicare Other | Admitting: Plastic Surgery

## 2021-08-18 ENCOUNTER — Telehealth: Payer: Self-pay | Admitting: Hematology and Oncology

## 2021-08-18 NOTE — Telephone Encounter (Signed)
Scheduled per sch msg. Called, not able to leave msg. Mailed printout  °

## 2021-08-23 ENCOUNTER — Encounter: Payer: Self-pay | Admitting: Surgical

## 2021-08-23 ENCOUNTER — Other Ambulatory Visit: Payer: Self-pay

## 2021-08-23 ENCOUNTER — Telehealth: Payer: Self-pay | Admitting: *Deleted

## 2021-08-23 ENCOUNTER — Telehealth (INDEPENDENT_AMBULATORY_CARE_PROVIDER_SITE_OTHER): Payer: Medicare Other | Admitting: Surgical

## 2021-08-23 DIAGNOSIS — D0511 Intraductal carcinoma in situ of right breast: Secondary | ICD-10-CM

## 2021-08-23 NOTE — Telephone Encounter (Signed)
Received call from pt with complaint of stomach upset and nausea x2 weeks. Pt states symptoms occur after taking Letrozole p.o.  per MD pt to stop Letrozole x2 weeks to see if symptoms resolve.  Pt educated and verbalized understanding to notify office of pt condition in 2 weeks.

## 2021-08-23 NOTE — Progress Notes (Signed)
Patient ID: Jordan Willis, female    DOB: 1951/10/17, 70 y.o.   MRN: [id number]  No chief complaint on file.     ICD-10-CM   1. Ductal carcinoma in situ (DCIS) of right breast  D05.11       History of Present Illness: Jordan Willis is a 70 y.o.  female .  She presents for preoperative evaluation for upcoming procedure, immediate bilateral breast reconstruction placement tissue expanders and Flex HD, scheduled for 09/04/21 with [name] after right mastectomy with sentinel lymph node biopsy and left simple mastectomy by [name].  The patient has not had problems with anesthesia. She did previously report some postanesthesia nausea. No history of DVT/PE.  No family history of DVT/PE.  No family or personal history of bleeding or clotting disorders.  She is currently on aspirin 81 mg daily, but reports she stopped this approximately 1 month ago in anticipation of upcoming surgery.  No history of CVA/MI.   PMH Significant for:  Hyperlipidemia, diabetes mellitus with most recent A1c 7.1, anxiety, GERD, left bundle branch block, postoperative nausea vomiting, sleep apnea, htn. NICM.  Tired, not sleeping well at night. Stopping to take letrozole today per recommendation of Oncology, due to some side effects.  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  The patient gave consent to have this visit done by telemedicine / virtual visit, two identifiers were used to identify patient. This is also consent for access the chart and treat the patient via this visit. The patient is located at home.  I, the provider, am at the office.  We spent 12 minutes together for the visit.  Joined by telephone and some video.   Past Medical History: Allergies: Allergies  Allergen Reactions   Tape Rash    Current Medications:  Current Outpatient Medications:    acetaminophen  (TYLENOL) 325 MG tablet, Take 650 mg by mouth every 6 (six) hours as needed for moderate pain., Disp: , Rfl:    aspirin 81 MG EC tablet, Take 81 mg by mouth every evening. Swallow whole., Disp: , Rfl:    atorvastatin (LIPITOR) 20 MG tablet, TAKE 1 TABLET BY MOUTH EVERYDAY AT BEDTIME, Disp: 90 tablet, Rfl: 1   cetirizine (ZYRTEC) 10 MG tablet, Take 10 mg by mouth daily., Disp: , Rfl:    Cholecalciferol (VITAMIN D3) 2000 units TABS, Take 2,000 Units by mouth daily., Disp: , Rfl:    Cyanocobalamin (B-12) 5000 MCG CAPS, Take 5,000 Units by mouth daily., Disp: , Rfl:    ENTRESTO 49-51 MG, TAKE 1 TABLET BY MOUTH TWICE A DAY, Disp: 180 tablet, Rfl: 0   fexofenadine (ALLEGRA) 180 MG tablet, Take 180 mg by mouth daily as needed for allergies., Disp: , Rfl:    fluticasone (FLONASE) 50 MCG/ACT nasal spray, Place 2 sprays into both nostrils daily., Disp: , Rfl:    GLUCOSAMINE-CHONDROITIN PO, Take 1 tablet by mouth every evening. , Disp: , Rfl:    glucose blood (ONETOUCH VERIO) test strip, TEST ONCE DAILY AS DIRECTED 90, Disp: , Rfl:    letrozole (FEMARA) 2.5 MG tablet, Take 1 tablet (2.5 mg total) by mouth daily., Disp: 30 tablet, Rfl: 6   metFORMIN (GLUCOPHAGE-XR) 500 MG 24 hr tablet, Take 500 mg by mouth 2 (two) times daily., Disp: , Rfl:    metoprolol succinate (TOPROL-XL) 25 MG 24 hr tablet, Take 25  mg by mouth daily., Disp: , Rfl:    Multiple Vitamin (MULTIVITAMIN WITH MINERALS) TABS tablet, Take 1 tablet by mouth daily., Disp: , Rfl:    multivitamin-lutein (OCUVITE-LUTEIN) CAPS capsule, Take 1 capsule by mouth daily., Disp: , Rfl:    nitroGLYCERIN (NITROSTAT) 0.4 MG SL tablet, Place 0.4 mg under the tongue every 5 (five) minutes as needed for chest pain., Disp: , Rfl:    Olopatadine HCl (PATADAY) 0.2 % SOLN, Place 1 drop into both eyes daily., Disp: , Rfl:    ondansetron (ZOFRAN) 4 MG tablet, Take 1 tablet (4 mg total) by mouth every 8 (eight) hours as needed for nausea or vomiting., Disp: 20 tablet,  Rfl: 0   OneTouch Delica Lancets 37T MISC, Apply topically., Disp: , Rfl:    Polyethyl Glycol-Propyl Glycol (ULTRA LUBRICANT EYE DROPS OP), Place 1 drop into both eyes 3 (three) times daily as needed (for dry eyes). , Disp: , Rfl:    PROAIR HFA 108 (90 Base) MCG/ACT inhaler, Inhale 2 puffs into the lungs every 6 (six) hours as needed for wheezing or shortness of breath. , Disp: , Rfl:    Probiotic Product (PROBIOTIC DAILY PO), Take 1 capsule by mouth daily., Disp: , Rfl:    sertraline (ZOLOFT) 50 MG tablet, Take 50 mg by mouth daily. , Disp: , Rfl:    solifenacin (VESICARE) 10 MG tablet, Take 10 mg by mouth daily as needed (bladder control)., Disp: , Rfl:    SUPER B COMPLEX/C PO, Take 1 tablet by mouth daily., Disp: , Rfl:    traMADol (ULTRAM) 50 MG tablet, Take 50 mg by mouth every 6 (six) hours as needed for severe pain., Disp: , Rfl:    traZODone (DESYREL) 50 MG tablet, Take 50 mg by mouth at bedtime as needed for sleep., Disp: , Rfl:   Past Medical Problems: Past Medical History:  Diagnosis Date   Anemia    Anxiety    Arthritis    Lumbar spine DDD.   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   Diabetes mellitus without complication (HCC)    Diet controlled   Dyspnea    When patient gets sick uses Pro-Air inhaler   Frequent sinus infections    GERD (gastroesophageal reflux disease)    occ   Goiter    History of cardiomegaly    History of cataract    Bilateral   History of kidney stones    08/28/2018 currently has a large kidney stone, asymptomatic at this time   History of migraine    History of uterine fibroid    History of uterine prolapse    Hyperlipidemia    Hypertension    LBBB (left bundle branch block)    Menopause 03/03/2012   Nasal turbinate hypertrophy 11/29/2016   Left inferior     Personal history of chemotherapy    Personal history of radiation therapy    PONV (postoperative nausea and vomiting)    Sinusitis 2014   Sleep apnea    No CPAP   SUI (stress urinary incontinence, female)    SVD (spontaneous vaginal delivery)    x 2   Thyroid nodule     Past Surgical History: Past Surgical History:  Procedure Laterality Date   BILATERAL SALPINGOOPHORECTOMY Bilateral    BLADDER SUSPENSION N/A  05/16/2017   Procedure: TRANSVAGINAL TAPE (TVT) PROCEDURE;  Surgeon: Everett Graff, MD;  Location: [facility];  Service: Gynecology;  Laterality: N/A;   BREAST BIOPSY Right 2009   BREAST LUMPECTOMY Right 2009   BREAST LUMPECTOMY Left 2006   CARDIAC CATHETERIZATION     CATARACT EXTRACTION Left 10/23/2017   CATARACT EXTRACTION Right 11/06/2017   COLONOSCOPY  10/22/2009   Normal.  Repeat 5 years.  [name]   CYSTOSCOPY N/A 05/16/2017   Procedure: CYSTOSCOPY;  Surgeon: Everett Graff, MD;  Location: [facility];  Service: Gynecology;  Laterality: N/A;   LEFT HEART CATH AND CORONARY ANGIOGRAPHY N/A 02/09/2020   Procedure: LEFT HEART CATH AND CORONARY ANGIOGRAPHY;  Surgeon: Nigel Mormon, MD;  Location: [place] [facility];  Service: Cardiovascular;  Laterality: N/A;   ROBOTIC ASSISTED TOTAL HYSTERECTOMY N/A 05/16/2017   Procedure: ROBOTIC ASSISTED TOTAL HYSTERECTOMY;  Surgeon: Delsa Bern, MD;  Location: [facility];  Service: Gynecology;  Laterality: N/A;   ROTATOR CUFF REPAIR Bilateral    SHOULDER ARTHROSCOPY WITH ROTATOR CUFF REPAIR AND SUBACROMIAL DECOMPRESSION Right 09/03/2018   Procedure: Right shoulder manipulation under anesthesia, exam under anesthesia, mini open rotator cuff repair, subacromial decompression;  Surgeon: Susa Day, MD;  Location: [facility];  Service: Orthopedics;  Laterality: Right;  90 mins   TUBAL LIGATION     WISDOM TOOTH EXTRACTION      Social History: Social History   Socioeconomic History   Marital status: Divorced    Spouse name: n/a   Number of  children: 2   Years of education: 12   Highest education level: Not on file  Occupational History   Occupation: Retired    Fish farm manager: [employer]    Comment: Psychologist, forensic  Tobacco Use   Smoking status: Never   Smokeless tobacco: Never  Vaping Use   Vaping Use: Never used  Substance and Sexual Activity   Alcohol use: No   Drug use: No   Sexual activity: Not Currently    Partners: Male    Birth control/protection: Post-menopausal, Surgical  Other Topics Concern   Not on file  Social History Narrative   Marital status: divorced; dating seriously x many years.       Children:  2 children; 1 grandchild.      Employment: Personnel officer.      Lives: alone      Tobacco: none       Alcohol: none      Exercise:  Walking with sister two days per week.      Seatbelt: 100%      Guns: none      Right-handed   Caffeine: occasional coffee, hot tea or green tea a few times per week   Social Determinants of Health   Financial Resource Strain: Not on file  Food Insecurity: Not on file  Transportation Needs: Not on file  Physical Activity: Not on file  Stress: Not on file  Social Connections: Not on file  Intimate Partner Violence: Not on file    Family History: Family History  Problem Relation Age of Onset   Cancer Mother 66       breast cancer   Hypertension Mother    Diabetes Father    Cancer Sister        breast cancer   Breast cancer Sister    Cancer Sister        breast cancer   Breast cancer Sister     Review of Systems: Review of Systems  Constitutional: Negative.   Respiratory: Negative.    Cardiovascular: Negative.   Gastrointestinal:  Positive for nausea.   Physical Exam: Vital Signs There were no vitals taken for this visit.  Physical Exam  Psychiatric:        Mood and Affect: Mood normal.        Behavior: Behavior normal.    Assessment/Plan: The patient is scheduled for bilateral immediate breast reconstruction with placement of tissue  expanders and Flex HD with [name].  Risks, benefits, and alternatives of procedure discussed, questions answered and consent obtained.     Smoking Status: Non-smoker; Counseling Given?  N/A   Caprini Score: 7, high; Risk Factors include: Age, BMI greater than 25, current breast cancer and length of planned surgery. Recommendation for mechanical prophylaxis. Encourage early ambulation.  Will discuss potential need for postoperative Lovenox with [name].   Pictures obtained: 07/20/21   Post-op Rx sent to pharmacy:  Norco, Zofran, Bactrim previously sent to pharmacy, pt confirmed pick up   Patient was provided with the breast tissue expander and General Surgical Risk consent document and Pain Medication Agreement prior to their appointment.  They had adequate time to read through the risk consent documents and Pain Medication Agreement. We also discussed them in person together during this preop appointment. All of their questions were answered to their satisfaction.  Recommended calling if they have any further questions.  Risk consent form and Pain Medication Agreement to be scanned into patient's chart.   The risks that can be encountered with and after placement of a breast expander placement were discussed and include the following but not limited to these: bleeding, infection, delayed healing, anesthesia risks, skin sensation changes, injury to structures including nerves, blood vessels, and muscles which may be temporary or permanent, allergies to tape, suture materials and glues, blood products, topical preparations or injected agents, skin contour irregularities, skin discoloration and swelling, deep vein thrombosis, cardiac and pulmonary complications, pain, which may persist, fluid accumulation, wrinkling of the skin over the expander, changes in nipple or breast sensation, expander leakage or rupture, faulty position of the expander, persistent pain, formation of tight scar tissue around the  expander (capsular contracture), possible need for revisional surgery or staged procedures.   Previously discussed patient's increased risk of postoperative complications given her history of bilateral radiation to breasts and diabetes mellitus with recent A1c 7.1.   Discussed medications to hold preop, including Aspirin   Electronically signed by: Carola Rhine Alexsus Papadopoulos, PA-C 08/23/2021 10:31 AM

## 2021-08-24 ENCOUNTER — Encounter: Payer: Medicare Other | Admitting: Surgical

## 2021-08-28 NOTE — Progress Notes (Signed)
Surgical Instructions    Your procedure is scheduled on Monday, November 14th, 2022.   Report to [facility] Main Entrance "A" at 07:00 A.M., then check in with the Admitting office.  Call this number if you have problems the morning of surgery:  [phone]   If you have any questions prior to your surgery date call [phone]: Open Monday-Friday 8am-4pm    Remember:  Do not eat after midnight the night before your surgery  You may drink clear liquids until 06:00 the morning of your surgery.   Clear liquids allowed are: Water, Non-Citrus Juices (without pulp), Carbonated Beverages, Clear Tea, Black Coffee ONLY (NO MILK, CREAM OR POWDERED CREAMER of any kind), and Gatorade    Take these medicines the morning of surgery with A SIP OF WATER:  cetirizine (ZYRTEC)  fluticasone (FLONASE) metoprolol succinate (TOPROL-XL) Olopatadine HCl (PATADAY) sertraline (ZOLOFT)   If needed:   acetaminophen (TYLENOL)  fexofenadine (ALLEGRA) nitroGLYCERIN (NITROSTAT) PROAIR HFA - please, bring the inhaler with you the day of surgery solifenacin (VESICARE) traMADol (ULTRAM)  ondansetron [facility])  Ask your MD if you need to hold letrozole [facility]) prior surgery,  Follow your surgeon's instructions on when to stop Aspirin.  If no instructions were given by your surgeon then you will need to call the office to get those instructions.     As of today, STOP taking any Aspirin (unless otherwise instructed by your surgeon) Aleve, Naproxen, Ibuprofen, Motrin, Advil, Goody's, BC's, all herbal medications, fish oil, and all vitamins.    WHAT DO I DO ABOUT MY DIABETES MEDICATION?   Do not take metFORMIN (GLUCOPHAGE-XR) the morning of surgery.   HOW TO MANAGE YOUR DIABETES BEFORE AND AFTER SURGERY  Why is it important to control my blood sugar before and after surgery? Improving blood sugar levels before and after surgery helps healing and can limit problems. A way of improving blood sugar  control is eating a healthy diet by:  Eating less sugar and carbohydrates  Increasing activity/exercise  Talking with your doctor about reaching your blood sugar goals High blood sugars (greater than 180 mg/dL) can raise your risk of infections and slow your recovery, so you will need to focus on controlling your diabetes during the weeks before surgery. Make sure that the doctor who takes care of your diabetes knows about your planned surgery including the date and location.  How do I manage my blood sugar before surgery? Check your blood sugar at least 4 times a day, starting 2 days before surgery, to make sure that the level is not too high or low.  Check your blood sugar the morning of your surgery when you wake up and every 2 hours until you get to the Short Stay unit.  If your blood sugar is less than 70 mg/dL, you will need to treat for low blood sugar: Do not take insulin. Treat a low blood sugar (less than 70 mg/dL) with  cup of clear juice (cranberry or apple), 4 glucose tablets, OR glucose gel. Recheck blood sugar in 15 minutes after treatment (to make sure it is greater than 70 mg/dL). If your blood sugar is not greater than 70 mg/dL on recheck, call [phone] for further instructions. Report your blood sugar to the short stay nurse when you get to Short Stay.  If you are admitted to the hospital after surgery: Your blood sugar will be checked by the staff and you will probably be given insulin after surgery (instead of oral diabetes medicines) to  make sure you have good blood sugar levels. The goal for blood sugar control after surgery is 80-180 mg/dL.      After your COVID test   You are not required to quarantine however you are required to wear a well-fitting mask when you are out and around people not in your household.  If your mask becomes wet or soiled, replace with a new one.  Wash your hands often with soap and water for 20 seconds or clean your hands with an  alcohol-based hand sanitizer that contains at least 60% alcohol.  Do not share personal items.  Notify your provider: if you are in close contact with someone who has COVID  or if you develop a fever of 100.4 or greater, sneezing, cough, sore throat, shortness of breath or body aches.    The day of surgery:          Do not wear jewelry or makeup Do not wear lotions, powders, perfumes, or deodorant. Do not shave 48 hours prior to surgery.   Do not bring valuables to the hospital. DO Not wear nail polish, gel polish, artificial nails, or any other type of covering on natural nails including finger and toenails. If patients have artificial nails, gel coating, etc. that need to be removed by a nail salon, please have this removed prior to surgery or surgery may need to be canceled/delayed if the surgeon/ anesthesia feels like the patient is unable to be adequately monitored.              [facility] is not responsible for any belongings or valuables.  Do NOT Smoke (Tobacco/Vaping)  24 hours prior to your procedure  If you use a CPAP at night, you may bring your mask for your overnight stay.   Contacts, glasses, hearing aids, dentures or partials may not be worn into surgery, please bring cases for these belongings   For patients admitted to the hospital, discharge time will be determined by your treatment team.   Patients discharged the day of surgery will not be allowed to drive home, and someone needs to stay with them for 24 hours.  NO VISITORS WILL BE ALLOWED IN PRE-OP WHERE PATIENTS ARE PREPPED FOR SURGERY.  ONLY 1 SUPPORT PERSON MAY BE PRESENT IN THE WAITING ROOM WHILE YOU ARE IN SURGERY.  IF YOU ARE TO BE ADMITTED, ONCE YOU ARE IN YOUR ROOM YOU WILL BE ALLOWED TWO (2) VISITORS. 1 (ONE) VISITOR MAY STAY OVERNIGHT BUT MUST ARRIVE TO THE ROOM BY 8pm.  Minor children may have two parents present. Special consideration for safety and communication needs will be reviewed on a case by case  basis.  Special instructions:    Oral Hygiene is also important to reduce your risk of infection.  Remember - BRUSH YOUR TEETH THE MORNING OF SURGERY WITH YOUR REGULAR TOOTHPASTE   [place]- Preparing For Surgery  Before surgery, you can play an important role. Because skin is not sterile, your skin needs to be as free of germs as possible. You can reduce the number of germs on your skin by washing with CHG (chlorahexidine gluconate) Soap before surgery.  CHG is an antiseptic cleaner which kills germs and bonds with the skin to continue killing germs even after washing.     Please do not use if you have an allergy to CHG or antibacterial soaps. If your skin becomes reddened/irritated stop using the CHG.  Do not shave (including legs and underarms) for at least 48  hours prior to first CHG shower. It is OK to shave your face.  Please follow these instructions carefully.     Shower the NIGHT BEFORE SURGERY and the MORNING OF SURGERY with CHG Soap.   If you chose to wash your hair, wash your hair first as usual with your normal shampoo. After you shampoo, rinse your hair and body thoroughly to remove the shampoo.  Then [employer] and genitals (private parts) with your normal soap and rinse thoroughly to remove soap.  After that Use CHG Soap as you would any other liquid soap. You can apply CHG directly to the skin and wash gently with a scrungie or a clean washcloth.   Apply the CHG Soap to your body ONLY FROM THE NECK DOWN.  Do not use on open wounds or open sores. Avoid contact with your eyes, ears, mouth and genitals (private parts). Wash Face and genitals (private parts)  with your normal soap.   Wash thoroughly, paying special attention to the area where your surgery will be performed.  Thoroughly rinse your body with warm water from the neck down.  DO NOT shower/wash with your normal soap after using and rinsing off the CHG Soap.  Pat yourself dry with a CLEAN TOWEL.  Wear CLEAN  PAJAMAS to bed the night before surgery  Place CLEAN SHEETS on your bed the night before your surgery  DO NOT SLEEP WITH PETS.   Day of Surgery:  Take a shower with CHG soap. Wear Clean/Comfortable clothing the morning of surgery Do not apply any deodorants/lotions.   Remember to brush your teeth WITH YOUR REGULAR TOOTHPASTE.   Please read over the following fact sheets that you were given.

## 2021-08-29 ENCOUNTER — Encounter (HOSPITAL_COMMUNITY): Payer: Self-pay

## 2021-08-29 ENCOUNTER — Other Ambulatory Visit: Payer: Self-pay

## 2021-08-29 ENCOUNTER — Encounter (HOSPITAL_COMMUNITY)
Admission: RE | Admit: 2021-08-29 | Discharge: 2021-08-29 | Disposition: A | Discharge status: Home / Self Care | Payer: Medicare Other | Source: Ambulatory Visit | Attending: General Surgery | Admitting: General Surgery

## 2021-08-29 VITALS — BP 143/62 | HR 69 | Temp 98.2°F | Resp 17 | Ht <= 58 in | Wt 197.7 lb

## 2021-08-29 DIAGNOSIS — Z7982 Long term (current) use of aspirin: Secondary | ICD-10-CM | POA: Diagnosis not present

## 2021-08-29 DIAGNOSIS — E119 Type 2 diabetes mellitus without complications: Secondary | ICD-10-CM | POA: Diagnosis not present

## 2021-08-29 DIAGNOSIS — Z01818 Encounter for other preprocedural examination: Secondary | ICD-10-CM

## 2021-08-29 DIAGNOSIS — Z853 Personal history of malignant neoplasm of breast: Secondary | ICD-10-CM | POA: Diagnosis not present

## 2021-08-29 DIAGNOSIS — I447 Left bundle-branch block, unspecified: Secondary | ICD-10-CM | POA: Diagnosis not present

## 2021-08-29 DIAGNOSIS — Z01812 Encounter for preprocedural laboratory examination: Secondary | ICD-10-CM | POA: Diagnosis not present

## 2021-08-29 DIAGNOSIS — I672 Cerebral atherosclerosis: Secondary | ICD-10-CM | POA: Diagnosis not present

## 2021-08-29 DIAGNOSIS — C50911 Malignant neoplasm of unspecified site of right female breast: Secondary | ICD-10-CM | POA: Diagnosis not present

## 2021-08-29 DIAGNOSIS — I10 Essential (primary) hypertension: Secondary | ICD-10-CM | POA: Insufficient documentation

## 2021-08-29 DIAGNOSIS — D649 Anemia, unspecified: Secondary | ICD-10-CM | POA: Insufficient documentation

## 2021-08-29 DIAGNOSIS — K219 Gastro-esophageal reflux disease without esophagitis: Secondary | ICD-10-CM | POA: Diagnosis not present

## 2021-08-29 DIAGNOSIS — Z9221 Personal history of antineoplastic chemotherapy: Secondary | ICD-10-CM | POA: Insufficient documentation

## 2021-08-29 DIAGNOSIS — Z79811 Long term (current) use of aromatase inhibitors: Secondary | ICD-10-CM | POA: Diagnosis not present

## 2021-08-29 DIAGNOSIS — E785 Hyperlipidemia, unspecified: Secondary | ICD-10-CM | POA: Diagnosis not present

## 2021-08-29 DIAGNOSIS — Z79899 Other long term (current) drug therapy: Secondary | ICD-10-CM | POA: Insufficient documentation

## 2021-08-29 DIAGNOSIS — Z923 Personal history of irradiation: Secondary | ICD-10-CM | POA: Insufficient documentation

## 2021-08-29 DIAGNOSIS — I429 Cardiomyopathy, unspecified: Secondary | ICD-10-CM | POA: Diagnosis not present

## 2021-08-29 HISTORY — DX: Angina pectoris, unspecified: I20.9

## 2021-08-29 HISTORY — DX: Pneumonia, unspecified organism: J18.9

## 2021-08-29 LAB — CBC
HCT: 39 % (ref 36.0–46.0)
Hemoglobin: 12.1 g/dL (ref 12.0–15.0)
MCH: 28.4 pg (ref 26.0–34.0)
MCHC: 31 g/dL (ref 30.0–36.0)
MCV: 91.5 fL (ref 80.0–100.0)
Platelets: 291 10*3/uL (ref 150–400)
RBC: 4.26 MIL/uL (ref 3.87–5.11)
RDW: 12.8 % (ref 11.5–15.5)
WBC: 6.3 10*3/uL (ref 4.0–10.5)
nRBC: 0 % (ref 0.0–0.2)

## 2021-08-29 LAB — BASIC METABOLIC PANEL
Anion gap: 9 (ref 5–15)
BUN: 11 mg/dL (ref 8–23)
CO2: 25 mmol/L (ref 22–32)
Calcium: 9.5 mg/dL (ref 8.9–10.3)
Chloride: 103 mmol/L (ref 98–111)
Creatinine, Ser: 0.59 mg/dL (ref 0.44–1.00)
GFR, Estimated: >60 mL/min (ref >60)
Glucose, Bld: 154 mg/dL — ABNORMAL HIGH (ref 70–99)
Potassium: 4.3 mmol/L (ref 3.5–5.1)
Sodium: 137 mmol/L (ref 135–145)

## 2021-08-29 LAB — GLUCOSE, CAPILLARY: Glucose-Capillary: 147 mg/dL — ABNORMAL HIGH (ref 70–99)

## 2021-08-29 NOTE — Progress Notes (Signed)
PCP - [name], MD Cardiologist - Dr. Rex Kras [facility] Cardiology  PPM/ICD - denies Device Orders - n/a Rep Notified - n/a  Chest x-ray - n/a EKG - 05/26/2021 Stress Test - 02/09/2020 ECHO - 06/15/2021 Cardiac Cath - 02/09/2020  Sleep Study - patient verbalized that she was unable to finish the test  CPAP - n/a  Fasting Blood Sugar - 86 - 158 Checks Blood Sugar once a day CBG today - 147 A1C - 7.2 on 08/01/2021  Blood Thinner Instructions: n/a  Aspirin Instructions: Aspirin last dose - patient didn't have Aspirin since last surgery in October  Femara - per patient, she stopped taking Femara 2 weeks ago.  Patient was instructed: As of today, STOP taking any Aspirin (unless otherwise instructed by your surgeon) Aleve, Naproxen, Ibuprofen, Motrin, Advil, Goody's, BC's, all herbal medications, fish oil, and all vitamins.    ERAS Protcol - yes PRE-SURGERY Ensure or G2- no  COVID TEST- no, the test was scheduled for 08/31/2021 @ 12:15 o'clock   Anesthesia review: yes, cardiac history  Patient denies shortness of breath, fever, cough and chest pain at PAT appointment   All instructions explained to the patient, with a verbal understanding of the material. Patient agrees to go over the instructions while at home for a better understanding. Patient also instructed to self quarantine after being tested for COVID-19. The opportunity to ask questions was provided.

## 2021-08-30 NOTE — Progress Notes (Signed)
Anesthesia Chart Review:  Case: [id number] Date/Time: 09/04/21 0845   Procedures:      RIGHT MASTECTOMY WITH RIGHT SENTINEL LYMPH NODE BIOPSY (Right: Breast)     LEFT SIMPLE MASTECTOMY (Left: Breast)     BILATERAL BREAST RECONSTRUCTION WITH PLACEMENT OF TISSUE EXPANDER AND FLEX HD (ACELLULAR HYDRATED DERMIS) (Bilateral)   Anesthesia type: General   Pre-op diagnosis: RECURRENT RIGHT BREAST CANCER   Location: [facility] OR ROOM 09 / [place]   Surgeons: Stark Klein, MD; [name], MD       DISCUSSION: Patient is a 70 year old female scheduled for the above procedure.  Surgery was initially scheduled for 08/08/21, but delayed due scheduling conflicts. Chart initially discussed with anesthesiologist Suella Broad, MD by Willeen Cass, NP.   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   She was diagnosed with non-ischemic cardiomyopathy in April 2021, and EF had recovered to EF 50-55% by June 2022 but down to 30-35% by August 2022 echo. Willeen Cass, NP had spoken with patient who was walking 30 minutes daily without CV symptoms and denied SOB and peripheral edema.  [name] had ordered th 06/15/21 echo for pre-chemo. His notes indicate that he has been sending communication to cardiologist regarding EF 30-35% and how this might impact chemotherapy agent choices.   According to 08/09/21 plastic surgery note by Scheeler, Rodman Key, PA-C, "Surgical Clearance has been received from Dr. Rex Kras   for patient's upcoming surgery with [name] . Medications to hold prior to surgery: Aspirin (Stop  taking: 7 days Begin retaking when recommend by surgeon.)    Patient is intermediate risk for perioperative cardiovascular complications.  She is optimized from cardiac standpoint but has nonmodifiable risk factors."  Patient reported she had not taken ASA since when surgery was scheduled for October, and Femera was stopped two weeks ago.   Presurgical COVID-19 test is scheduled for 08/31/2021.  Anesthesia team to evaluate on the day of surgery.   VS: BP (!) 143/62 (BP Location: Left Arm)   Pulse 69   Temp 36.8 C   Resp 17   Ht (!) 5.7" (0.145 m)   Wt 89.7 kg   SpO2 100%   BMI 4278.19 kg/m  BMI to be recalculated on the day of surgery.   PROVIDERSDeland Pretty, MD is PCP  Rex Kras, DO is cardiologist. Last visit 05/26/21.  Nicholas Lose, MD is HEM-ONC   LABS: Labs reviewed: Acceptable for surgery. A1c 7.2%.  (all labs ordered are listed, but only abnormal results are displayed)  Labs Reviewed  GLUCOSE, CAPILLARY - Abnormal; Notable for the following components:      Result Value   Glucose-Capillary 147 (*)    All other components within normal limits  BASIC METABOLIC PANEL - Abnormal; Notable for the following components:   Glucose, Bld 154 (*)    All other components within normal limits  CBC    EKG: 05/26/21: Normal sinus rhythm, 72 bpm, left axis, LAE, left bundle branch block.    CV: Echo 06/15/21:  1. Septal dyssynergy due to LBBB; severe hypokinesis of the anteroseptal wall and apex; overall moderate to severe LV dysfunction.  2. Left ventricular ejection fraction, by  estimation, is 30 to 35%. The left ventricle has moderate to severely decreased function. The left ventricle demonstrates regional wall motion abnormalities (see scoring diagram/findings for description). The left ventricular internal cavity size was mildly dilated. Left ventricular diastolic parameters are consistent with Grade I diastolic dysfunction (impaired relaxation). Elevated left atrial  pressure. The average left ventricular global longitudinal strain is -16.7 %. The global longitudinal strain is abnormal.  3. Right ventricular systolic function is normal. The right ventricular size is normal. There is normal pulmonary artery systolic pressure.   4. Left atrial size was mildly dilated.   5. The mitral valve is normal in structure. Mild to moderate mitral valve regurgitation. No evidence of mitral stenosis.   6. The aortic valve is tricuspid. Aortic valve regurgitation is not  visualized. Mild aortic valve sclerosis is present, with no evidence of  aortic valve stenosis.   7. The inferior vena cava is normal in size with greater than 50%  respiratory variability, suggesting right atrial pressure of 3 mmHg.    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   Diabetes mellitus without complication (HCC)    Diet controlled   Dyspnea    When patient gets sick uses Pro-Air inhaler   Frequent sinus infections    GERD (gastroesophageal reflux disease)    occ   Goiter    History of cardiomegaly    History of cataract    Bilateral   History of kidney stones    08/28/2018 currently has a large kidney stone, asymptomatic at this time   History of migraine    History of uterine fibroid    History of uterine prolapse    Hyperlipidemia    Hypertension    LBBB  (left bundle branch block)    Menopause 03/03/2012   Nasal turbinate hypertrophy 11/29/2016   Left inferior    Personal history of chemotherapy    Personal history of radiation therapy    Pneumonia    as a child   PONV (postoperative nausea and vomiting)    Sinusitis 2014   Sleep apnea    No CPAP   SUI (stress urinary incontinence, female)    SVD (spontaneous vaginal delivery)    x 2   Thyroid nodule     Past Surgical History:  Procedure Laterality Date   ABDOMINAL HYSTERECTOMY     BILATERAL SALPINGOOPHORECTOMY Bilateral    BLADDER SUSPENSION N/A 05/16/2017   Procedure: TRANSVAGINAL TAPE (TVT) PROCEDURE;  Surgeon: Everett Graff, MD;  Location: [facility];  Service: Gynecology;  Laterality: N/A;   BREAST BIOPSY Right 2009   BREAST LUMPECTOMY Right 2009   BREAST LUMPECTOMY Left 2006   CARDIAC CATHETERIZATION     CATARACT EXTRACTION Left 10/23/2017   CATARACT EXTRACTION Right 11/06/2017   COLONOSCOPY  10/22/2009   Normal.  Repeat 5 years.  Nat [name]   CYSTOSCOPY N/A  05/16/2017   Procedure: CYSTOSCOPY;  Surgeon: Everett Graff, MD;  Location: [facility];  Service: Gynecology;  Laterality: N/A;   EYE SURGERY     bilateral cataracts   LEFT HEART CATH AND CORONARY ANGIOGRAPHY N/A 02/09/2020   Procedure: LEFT HEART CATH AND CORONARY ANGIOGRAPHY;  Surgeon: Nigel Mormon, MD;  Location: [facility];  Service: Cardiovascular;  Laterality: N/A;   ROBOTIC ASSISTED TOTAL HYSTERECTOMY N/A 05/16/2017   Procedure: ROBOTIC ASSISTED TOTAL HYSTERECTOMY;  Surgeon: Delsa Bern, MD;  Location: [facility];  Service: Gynecology;  Laterality: N/A;   ROTATOR CUFF REPAIR Bilateral    SHOULDER ARTHROSCOPY WITH ROTATOR CUFF REPAIR AND SUBACROMIAL DECOMPRESSION Right 09/03/2018   Procedure: Right shoulder manipulation under anesthesia, exam under anesthesia, mini open rotator cuff repair, subacromial decompression;  Surgeon: Susa Day, MD;  Location: [facility];  Service: Orthopedics;  Laterality:  Right;  90 mins   TUBAL LIGATION     WISDOM TOOTH EXTRACTION      MEDICATIONS:  acetaminophen (TYLENOL) 500 MG tablet   aspirin 81 MG EC tablet   atorvastatin (LIPITOR) 20 MG tablet   Biotin w/ Vitamins C & E (HAIR/SKIN/NAILS PO)   cetirizine (ZYRTEC) 10 MG tablet   Cholecalciferol (VITAMIN D3) 25 MCG (1000 UT) CAPS   ENTRESTO 49-51 MG   fluticasone (FLONASE) 50 MCG/ACT nasal spray   GLUCOSAMINE-CHONDROITIN PO   glucose blood (ONETOUCH VERIO) test strip   letrozole (FEMARA) 2.5 MG tablet   metFORMIN (GLUCOPHAGE-XR) 500 MG 24 hr tablet   metoprolol succinate (TOPROL-XL) 25 MG 24 hr tablet   Multiple Vitamin (MULTIVITAMIN WITH MINERALS) TABS tablet   multivitamin-lutein (OCUVITE-LUTEIN) CAPS capsule   nitroGLYCERIN (NITROSTAT) 0.4 MG SL tablet   Olopatadine HCl 0.2 % SOLN   ondansetron (ZOFRAN) 4 MG tablet   OneTouch [employer]ica Lancets 79T MISC   OVER THE COUNTER MEDICATION   Polyethyl Glycol-Propyl Glycol (ULTRA LUBRICANT EYE DROPS OP)   PROAIR HFA 108 (90 Base) MCG/ACT inhaler   Probiotic Product (PROBIOTIC DAILY PO)   sertraline (ZOLOFT) 50 MG tablet   solifenacin (VESICARE) 10 MG tablet   SUPER B COMPLEX/C PO   traMADol (ULTRAM) 50 MG tablet   traZODone (DESYREL) 50 MG tablet   No current facility-administered medications for this encounter.    Myra Gianotti, PA-C Surgical Short Stay/Anesthesiology [facility] Phone [phone] [facility] Phone [phone] 08/30/2021 4:18 PM

## 2021-08-30 NOTE — Anesthesia Preprocedure Evaluation (Addendum)
Anesthesia Evaluation  Patient identified by MRN, date of birth, ID band Patient awake    Reviewed: Allergy & Precautions, NPO status , Patient's Chart, lab work & pertinent test results, reviewed documented beta blocker date and time   History of Anesthesia Complications (+) PONV and history of anesthetic complications (has done well when antiemetics were given intraop)  Airway Mallampati: III  TM Distance: >3 FB Neck ROM: Full    Dental  (+) Dental Advisory Given, Missing, Teeth Intact,    Pulmonary sleep apnea (didn't finish sleep study, snores at night, has had apneas) ,  Doesn't think she has an asthma diagnosis but has an albuterol inhaler- rarely uses inhaler   Pulmonary exam normal breath sounds clear to auscultation       Cardiovascular hypertension (153/61 in preop, normally SBP 120s), Pt. on medications and Pt. on home beta blockers +CHF (LVEF 35-59%, grade 1 diastolic dsyfunction)  Normal cardiovascular exam+ Valvular Problems/Murmurs (mild-mod MR) MR  Rhythm:Regular Rate:Normal  Echo 05/2021: 1. Septal dyssynergy due to LBBB; severe hypokinesis of the anteroseptal  wall and apex; overall moderate to severe LV dysfunction.  2. Left ventricular ejection fraction, by estimation, is 30 to 35%. The  left ventricle has moderate to severely decreased function. The left  ventricle demonstrates regional wall motion abnormalities (see scoring  diagram/findings for description). The  left ventricular internal cavity size was mildly dilated. Left ventricular  diastolic parameters are consistent with Grade I diastolic dysfunction  (impaired relaxation). Elevated left atrial pressure. The average left  ventricular global longitudinal  strain is -16.7 %. The global longitudinal strain is abnormal.  3. Right ventricular systolic function is normal. The right ventricular  size is normal. There is normal pulmonary artery systolic  pressure.  4. Left atrial size was mildly dilated.  5. The mitral valve is normal in structure. Mild to moderate mitral valve  regurgitation. No evidence of mitral stenosis.  6. The aortic valve is tricuspid. Aortic valve regurgitation is not  visualized. Mild aortic valve sclerosis is present, with no evidence of  aortic valve stenosis.  7. The inferior vena cava is normal in size with greater than 50%  respiratory variability, suggesting right atrial pressure of 3 mmHg.   Cath 2021 normal  Intermediate risk per cardiology   Neuro/Psych  Headaches, PSYCHIATRIC DISORDERS Anxiety    GI/Hepatic Neg liver ROS, GERD  Controlled,  Endo/Other  diabetes, Well Controlled, Type 2, Oral Hypoglycemic Agentsa1c 7.2  Renal/GU negative Renal ROS  negative genitourinary   Musculoskeletal  (+) Arthritis , Osteoarthritis,    Abdominal   Peds  Hematology hct 39   Anesthesia Other Findings recurrent R breast ca   Reproductive/Obstetrics negative OB ROS                           Anesthesia Physical Anesthesia Plan  ASA: 3  Anesthesia Plan: General and Regional   Post-op Pain Management: GA combined w/ Regional for post-op pain   Induction: Intravenous  PONV Risk Score and Plan: 4 or greater and Ondansetron, Dexamethasone, Midazolam, Scopolamine patch - Pre-op and Treatment may vary due to age or medical condition  Airway Management Planned: Oral ETT  Additional Equipment:   Intra-op Plan:   Post-operative Plan: Extubation in OR  Informed Consent: I have reviewed the patients History and Physical, chart, labs and discussed the procedure including the risks, benefits and alternatives for the proposed anesthesia with the patient or authorized representative who has  indicated his/her understanding and acceptance.     Dental advisory given  Plan Discussed with: CRNA  Anesthesia Plan Comments:       Anesthesia Quick Evaluation

## 2021-08-31 ENCOUNTER — Other Ambulatory Visit (HOSPITAL_COMMUNITY)
Admission: RE | Admit: 2021-08-31 | Discharge: 2021-08-31 | Disposition: A | Discharge status: Home / Self Care | Payer: Medicare Other | Source: Ambulatory Visit | Attending: General Surgery | Admitting: General Surgery

## 2021-08-31 DIAGNOSIS — Z20822 Contact with and (suspected) exposure to covid-19: Secondary | ICD-10-CM | POA: Diagnosis not present

## 2021-08-31 DIAGNOSIS — Z01818 Encounter for other preprocedural examination: Secondary | ICD-10-CM

## 2021-08-31 LAB — SARS CORONAVIRUS 2 (TAT 6-24 HRS): SARS Coronavirus 2: NEGATIVE

## 2021-09-03 ENCOUNTER — Other Ambulatory Visit: Payer: Self-pay | Admitting: Cardiology

## 2021-09-03 DIAGNOSIS — I428 Other cardiomyopathies: Secondary | ICD-10-CM

## 2021-09-03 DIAGNOSIS — I10 Essential (primary) hypertension: Secondary | ICD-10-CM

## 2021-09-04 ENCOUNTER — Other Ambulatory Visit: Payer: Self-pay

## 2021-09-04 ENCOUNTER — Ambulatory Visit (HOSPITAL_COMMUNITY): Payer: Medicare Other | Admitting: Anesthesiology

## 2021-09-04 ENCOUNTER — Encounter (HOSPITAL_COMMUNITY)
Admission: RE | Disposition: A | Discharge status: Home / Self Care | Payer: Self-pay | Source: Home / Self Care | Attending: General Surgery

## 2021-09-04 ENCOUNTER — Ambulatory Visit (HOSPITAL_COMMUNITY): Payer: Medicare Other | Admitting: Vascular Surgery

## 2021-09-04 ENCOUNTER — Encounter (HOSPITAL_COMMUNITY): Payer: Self-pay | Admitting: General Surgery

## 2021-09-04 ENCOUNTER — Ambulatory Visit (HOSPITAL_COMMUNITY)
Admission: RE | Admit: 2021-09-04 | Discharge: 2021-09-05 | Disposition: A | Discharge status: Home / Self Care | Payer: Medicare Other | Attending: General Surgery | Admitting: General Surgery

## 2021-09-04 ENCOUNTER — Ambulatory Visit (HOSPITAL_COMMUNITY)
Admission: RE | Admit: 2021-09-04 | Discharge: 2021-09-04 | Disposition: A | Discharge status: Home / Self Care | Payer: Medicare Other | Source: Ambulatory Visit | Attending: General Surgery | Admitting: General Surgery

## 2021-09-04 DIAGNOSIS — I509 Heart failure, unspecified: Secondary | ICD-10-CM | POA: Insufficient documentation

## 2021-09-04 DIAGNOSIS — M1990 Unspecified osteoarthritis, unspecified site: Secondary | ICD-10-CM | POA: Diagnosis not present

## 2021-09-04 DIAGNOSIS — Z170 Estrogen receptor positive status [ER+]: Secondary | ICD-10-CM

## 2021-09-04 DIAGNOSIS — Z853 Personal history of malignant neoplasm of breast: Secondary | ICD-10-CM | POA: Insufficient documentation

## 2021-09-04 DIAGNOSIS — Z803 Family history of malignant neoplasm of breast: Secondary | ICD-10-CM | POA: Insufficient documentation

## 2021-09-04 DIAGNOSIS — R921 Mammographic calcification found on diagnostic imaging of breast: Secondary | ICD-10-CM | POA: Diagnosis not present

## 2021-09-04 DIAGNOSIS — C50411 Malignant neoplasm of upper-outer quadrant of right female breast: Secondary | ICD-10-CM | POA: Diagnosis not present

## 2021-09-04 DIAGNOSIS — D0511 Intraductal carcinoma in situ of right breast: Secondary | ICD-10-CM | POA: Diagnosis present

## 2021-09-04 DIAGNOSIS — T451X5A Adverse effect of antineoplastic and immunosuppressive drugs, initial encounter: Secondary | ICD-10-CM | POA: Diagnosis not present

## 2021-09-04 DIAGNOSIS — K219 Gastro-esophageal reflux disease without esophagitis: Secondary | ICD-10-CM | POA: Diagnosis not present

## 2021-09-04 DIAGNOSIS — Z923 Personal history of irradiation: Secondary | ICD-10-CM | POA: Insufficient documentation

## 2021-09-04 DIAGNOSIS — Z7984 Long term (current) use of oral hypoglycemic drugs: Secondary | ICD-10-CM | POA: Insufficient documentation

## 2021-09-04 DIAGNOSIS — G620 Drug-induced polyneuropathy: Secondary | ICD-10-CM | POA: Diagnosis not present

## 2021-09-04 DIAGNOSIS — I110 Hypertensive heart disease with heart failure: Secondary | ICD-10-CM | POA: Insufficient documentation

## 2021-09-04 DIAGNOSIS — Z171 Estrogen receptor negative status [ER-]: Secondary | ICD-10-CM | POA: Diagnosis not present

## 2021-09-04 DIAGNOSIS — I427 Cardiomyopathy due to drug and external agent: Secondary | ICD-10-CM | POA: Insufficient documentation

## 2021-09-04 DIAGNOSIS — Z1501 Genetic susceptibility to malignant neoplasm of breast: Secondary | ICD-10-CM | POA: Diagnosis not present

## 2021-09-04 DIAGNOSIS — G4730 Sleep apnea, unspecified: Secondary | ICD-10-CM | POA: Insufficient documentation

## 2021-09-04 DIAGNOSIS — D630 Anemia in neoplastic disease: Secondary | ICD-10-CM | POA: Diagnosis not present

## 2021-09-04 DIAGNOSIS — G8918 Other acute postprocedural pain: Secondary | ICD-10-CM | POA: Diagnosis not present

## 2021-09-04 DIAGNOSIS — E119 Type 2 diabetes mellitus without complications: Secondary | ICD-10-CM | POA: Diagnosis not present

## 2021-09-04 DIAGNOSIS — Z421 Encounter for breast reconstruction following mastectomy: Secondary | ICD-10-CM

## 2021-09-04 DIAGNOSIS — N6031 Fibrosclerosis of right breast: Secondary | ICD-10-CM | POA: Diagnosis not present

## 2021-09-04 DIAGNOSIS — C50911 Malignant neoplasm of unspecified site of right female breast: Secondary | ICD-10-CM | POA: Diagnosis not present

## 2021-09-04 HISTORY — PX: BREAST RECONSTRUCTION WITH PLACEMENT OF TISSUE EXPANDER AND FLEX HD (ACELLULAR HYDRATED DERMIS): SHX6295

## 2021-09-04 HISTORY — PX: MASTECTOMY W/ SENTINEL NODE BIOPSY: SHX2001

## 2021-09-04 LAB — GLUCOSE, CAPILLARY
Glucose-Capillary: 125 mg/dL — ABNORMAL HIGH (ref 70–99)
Glucose-Capillary: 152 mg/dL — ABNORMAL HIGH (ref 70–99)
Glucose-Capillary: 138 mg/dL — ABNORMAL HIGH (ref 70–99)
Glucose-Capillary: 205 mg/dL — ABNORMAL HIGH (ref 70–99)
Glucose-Capillary: 224 mg/dL — ABNORMAL HIGH (ref 70–99)

## 2021-09-04 SURGERY — MASTECTOMY WITH SENTINEL LYMPH NODE BIOPSY
Anesthesia: Regional | Site: Breast | Laterality: Right

## 2021-09-04 MED ORDER — PHENYLEPHRINE HCL-NACL 20-0.9 MG/250ML-% IV SOLN
INTRAVENOUS | Status: DC | PRN
  Administered 2021-09-04: 40 ug/min via INTRAVENOUS

## 2021-09-04 MED ORDER — OXYCODONE HCL 5 MG PO TABS
5.0000 mg | ORAL_TABLET | ORAL | Status: DC | PRN
  Administered 2021-09-04 – 2021-09-05 (×2): 10 mg via ORAL
  Filled 2021-09-04 (×2): qty 2

## 2021-09-04 MED ORDER — SODIUM CHLORIDE (PF) 0.9 % IJ SOLN
INTRAMUSCULAR | Status: DC | PRN
  Administered 2021-09-04: 400 mL

## 2021-09-04 MED ORDER — DEXAMETHASONE SODIUM PHOSPHATE 10 MG/ML IJ SOLN
INTRAMUSCULAR | Status: DC | PRN
  Administered 2021-09-04: 5 mg via INTRAVENOUS

## 2021-09-04 MED ORDER — FENTANYL CITRATE (PF) 100 MCG/2ML IJ SOLN: 100.0000 ug | Freq: Once | INTRAMUSCULAR | Status: AC

## 2021-09-04 MED ORDER — DARIFENACIN HYDROBROMIDE ER 7.5 MG PO TB24
7.5000 mg | ORAL_TABLET | Freq: Every day | ORAL | Status: DC
  Administered 2021-09-05: 7.5 mg via ORAL
  Filled 2021-09-04: qty 1

## 2021-09-04 MED ORDER — MORPHINE SULFATE (PF) 2 MG/ML IV SOLN: 1.0000 mg | INTRAVENOUS | Status: DC | PRN

## 2021-09-04 MED ORDER — SODIUM CHLORIDE 0.9 % IV SOLN
INTRAVENOUS | Status: DC | PRN
  Administered 2021-09-04: 500 mL

## 2021-09-04 MED ORDER — CHLORHEXIDINE GLUCONATE CLOTH 2 % EX PADS: 6.0000 | MEDICATED_PAD | Freq: Once | CUTANEOUS | Status: DC

## 2021-09-04 MED ORDER — PROPOFOL 500 MG/50ML IV EMUL
INTRAVENOUS | Status: DC | PRN
  Administered 2021-09-04: 25 ug/kg/min via INTRAVENOUS

## 2021-09-04 MED ORDER — PHENYLEPHRINE 40 MCG/ML (10ML) SYRINGE FOR IV PUSH (FOR BLOOD PRESSURE SUPPORT)
PREFILLED_SYRINGE | INTRAVENOUS | Status: DC | PRN
  Administered 2021-09-04: 120 ug via INTRAVENOUS
  Administered 2021-09-04: 80 ug via INTRAVENOUS

## 2021-09-04 MED ORDER — SIMETHICONE 80 MG PO CHEW: 40.0000 mg | CHEWABLE_TABLET | Freq: Four times a day (QID) | ORAL | Status: DC | PRN

## 2021-09-04 MED ORDER — FLUTICASONE PROPIONATE 50 MCG/ACT NA SUSP
2.0000 | Freq: Every day | NASAL | Status: DC | PRN
  Filled 2021-09-04: qty 16

## 2021-09-04 MED ORDER — HYDROCODONE-ACETAMINOPHEN 5-325 MG PO TABS
1.0000 | ORAL_TABLET | ORAL | Status: DC | PRN
Start: 2021-09-04 — End: 2021-09-04

## 2021-09-04 MED ORDER — ONDANSETRON HCL 4 MG/2ML IJ SOLN: 4.0000 mg | Freq: Four times a day (QID) | INTRAMUSCULAR | Status: DC | PRN

## 2021-09-04 MED ORDER — METOPROLOL SUCCINATE ER 25 MG PO TB24
25.0000 mg | ORAL_TABLET | Freq: Every day | ORAL | Status: DC
  Administered 2021-09-05: 25 mg via ORAL
  Filled 2021-09-04: qty 1

## 2021-09-04 MED ORDER — LORATADINE 10 MG PO TABS
10.0000 mg | ORAL_TABLET | Freq: Every day | ORAL | Status: DC
  Administered 2021-09-05: 10 mg via ORAL
  Filled 2021-09-04: qty 1

## 2021-09-04 MED ORDER — ONDANSETRON HCL 4 MG/2ML IJ SOLN
INTRAMUSCULAR | Status: AC
  Filled 2021-09-04: qty 2

## 2021-09-04 MED ORDER — SUGAMMADEX SODIUM 200 MG/2ML IV SOLN
INTRAVENOUS | Status: DC | PRN
  Administered 2021-09-04: 200 mg via INTRAVENOUS

## 2021-09-04 MED ORDER — ONDANSETRON 4 MG PO TBDP
4.0000 mg | ORAL_TABLET | Freq: Four times a day (QID) | ORAL | Status: DC | PRN
  Filled 2021-09-04: qty 1

## 2021-09-04 MED ORDER — METOPROLOL SUCCINATE ER 25 MG PO TB24
ORAL_TABLET | ORAL | Status: AC
  Administered 2021-09-04: 25 mg via ORAL
  Filled 2021-09-04: qty 1

## 2021-09-04 MED ORDER — TECHNETIUM TC 99M TILMANOCEPT KIT
1.0000 | PACK | Freq: Once | INTRAVENOUS | Status: AC | PRN
  Administered 2021-09-04: 1 via INTRADERMAL

## 2021-09-04 MED ORDER — SERTRALINE HCL 50 MG PO TABS
50.0000 mg | ORAL_TABLET | Freq: Every day | ORAL | Status: DC
  Administered 2021-09-04 – 2021-09-05 (×2): 50 mg via ORAL
  Filled 2021-09-04 (×2): qty 1

## 2021-09-04 MED ORDER — MIDAZOLAM HCL 2 MG/2ML IJ SOLN: 2.0000 mg | Freq: Once | INTRAMUSCULAR | Status: AC

## 2021-09-04 MED ORDER — FENTANYL CITRATE (PF) 250 MCG/5ML IJ SOLN
INTRAMUSCULAR | Status: AC
  Filled 2021-09-04: qty 5

## 2021-09-04 MED ORDER — BUPIVACAINE LIPOSOME 1.3 % IJ SUSP
INTRAMUSCULAR | Status: DC | PRN
  Administered 2021-09-04: 10 mL via PERINEURAL

## 2021-09-04 MED ORDER — ONDANSETRON HCL 4 MG/2ML IJ SOLN: 4.0000 mg | Freq: Once | INTRAMUSCULAR | Status: DC | PRN

## 2021-09-04 MED ORDER — METOPROLOL SUCCINATE ER 25 MG PO TB24: 25.0000 mg | ORAL_TABLET | Freq: Every day | ORAL | Status: DC

## 2021-09-04 MED ORDER — ONDANSETRON HCL 4 MG/2ML IJ SOLN
INTRAMUSCULAR | Status: DC | PRN
  Administered 2021-09-04: 4 mg via INTRAVENOUS

## 2021-09-04 MED ORDER — OXYCODONE HCL 5 MG PO TABS: 5.0000 mg | ORAL_TABLET | Freq: Once | ORAL | Status: DC | PRN

## 2021-09-04 MED ORDER — KCL IN DEXTROSE-NACL 20-5-0.45 MEQ/L-%-% IV SOLN
INTRAVENOUS | Status: DC
  Filled 2021-09-04: qty 1000

## 2021-09-04 MED ORDER — METHOCARBAMOL 500 MG PO TABS: 500.0000 mg | ORAL_TABLET | Freq: Four times a day (QID) | ORAL | Status: DC | PRN

## 2021-09-04 MED ORDER — ADULT MULTIVITAMIN W/MINERALS CH
1.0000 | ORAL_TABLET | Freq: Every day | ORAL | Status: DC
  Administered 2021-09-05: 1 via ORAL
  Filled 2021-09-04: qty 1

## 2021-09-04 MED ORDER — NON FORMULARY
Status: DC | PRN
  Administered 2021-09-04: 2 mL

## 2021-09-04 MED ORDER — ACETAMINOPHEN 500 MG PO TABS
1000.0000 mg | ORAL_TABLET | Freq: Once | ORAL | Status: AC
  Administered 2021-09-04: 1000 mg via ORAL
  Filled 2021-09-04: qty 2

## 2021-09-04 MED ORDER — BUPIVACAINE-EPINEPHRINE (PF) 0.5% -1:200000 IJ SOLN
INTRAMUSCULAR | Status: DC | PRN
  Administered 2021-09-04: 30 mL

## 2021-09-04 MED ORDER — PROPOFOL 10 MG/ML IV BOLUS
INTRAVENOUS | Status: AC
  Filled 2021-09-04: qty 20

## 2021-09-04 MED ORDER — HYDROMORPHONE HCL 1 MG/ML IJ SOLN
0.2500 mg | INTRAMUSCULAR | Status: DC | PRN
  Administered 2021-09-04 (×3): 0.5 mg via INTRAVENOUS

## 2021-09-04 MED ORDER — ONDANSETRON HCL 4 MG PO TABS: 4.0000 mg | ORAL_TABLET | Freq: Three times a day (TID) | ORAL | Status: DC | PRN

## 2021-09-04 MED ORDER — ACETAMINOPHEN 500 MG PO TABS: 1000.0000 mg | ORAL_TABLET | Freq: Four times a day (QID) | ORAL | Status: DC | PRN

## 2021-09-04 MED ORDER — DIPHENHYDRAMINE HCL 50 MG/ML IJ SOLN: 12.5000 mg | Freq: Four times a day (QID) | INTRAMUSCULAR | Status: DC | PRN

## 2021-09-04 MED ORDER — LACTATED RINGERS IV SOLN: INTRAVENOUS | Status: DC

## 2021-09-04 MED ORDER — TRAZODONE HCL 50 MG PO TABS: 50.0000 mg | ORAL_TABLET | Freq: Every evening | ORAL | Status: DC | PRN

## 2021-09-04 MED ORDER — LETROZOLE 2.5 MG PO TABS: 2.5000 mg | ORAL_TABLET | Freq: Every day | ORAL | Status: DC

## 2021-09-04 MED ORDER — ROCURONIUM BROMIDE 10 MG/ML (PF) SYRINGE
PREFILLED_SYRINGE | INTRAVENOUS | Status: AC
  Filled 2021-09-04: qty 10

## 2021-09-04 MED ORDER — HYDROMORPHONE HCL 1 MG/ML IJ SOLN
INTRAMUSCULAR | Status: AC
  Filled 2021-09-04: qty 1

## 2021-09-04 MED ORDER — LIDOCAINE 2% (20 MG/ML) 5 ML SYRINGE
INTRAMUSCULAR | Status: AC
  Filled 2021-09-04: qty 5

## 2021-09-04 MED ORDER — ATORVASTATIN CALCIUM 10 MG PO TABS
20.0000 mg | ORAL_TABLET | Freq: Every day | ORAL | Status: DC
  Administered 2021-09-05: 20 mg via ORAL
  Filled 2021-09-04: qty 2

## 2021-09-04 MED ORDER — HYDROMORPHONE HCL 1 MG/ML IJ SOLN
0.5000 mg | INTRAMUSCULAR | Status: DC | PRN
  Administered 2021-09-04 – 2021-09-05 (×3): 1 mg via INTRAVENOUS
  Filled 2021-09-04 (×3): qty 1

## 2021-09-04 MED ORDER — CHLORHEXIDINE GLUCONATE 0.12 % MT SOLN
15.0000 mL | Freq: Once | OROMUCOSAL | Status: AC
  Administered 2021-09-04: 15 mL via OROMUCOSAL
  Filled 2021-09-04: qty 15

## 2021-09-04 MED ORDER — IBUPROFEN 600 MG PO TABS: 600.0000 mg | ORAL_TABLET | Freq: Four times a day (QID) | ORAL | Status: DC | PRN

## 2021-09-04 MED ORDER — PROCHLORPERAZINE MALEATE 10 MG PO TABS
10.0000 mg | ORAL_TABLET | Freq: Four times a day (QID) | ORAL | Status: DC | PRN
  Filled 2021-09-04: qty 1

## 2021-09-04 MED ORDER — POLYETHYLENE GLYCOL 3350 17 G PO PACK: 17.0000 g | PACK | Freq: Every day | ORAL | Status: DC | PRN

## 2021-09-04 MED ORDER — ACETAMINOPHEN 500 MG PO TABS: 1000.0000 mg | ORAL_TABLET | Freq: Four times a day (QID) | ORAL | Status: DC

## 2021-09-04 MED ORDER — ALBUTEROL SULFATE (2.5 MG/3ML) 0.083% IN NEBU: 3.0000 mL | INHALATION_SOLUTION | Freq: Four times a day (QID) | RESPIRATORY_TRACT | Status: DC | PRN

## 2021-09-04 MED ORDER — ACETAMINOPHEN 500 MG PO TABS: 1000.0000 mg | ORAL_TABLET | ORAL | Status: DC

## 2021-09-04 MED ORDER — FENTANYL CITRATE (PF) 100 MCG/2ML IJ SOLN
INTRAMUSCULAR | Status: AC
  Administered 2021-09-04: 100 ug via INTRAVENOUS
  Filled 2021-09-04: qty 2

## 2021-09-04 MED ORDER — TRAMADOL HCL 50 MG PO TABS: 50.0000 mg | ORAL_TABLET | Freq: Four times a day (QID) | ORAL | Status: DC | PRN

## 2021-09-04 MED ORDER — POLYVINYL ALCOHOL 1.4 % OP SOLN
Freq: Three times a day (TID) | OPHTHALMIC | Status: DC | PRN
  Filled 2021-09-04: qty 15

## 2021-09-04 MED ORDER — DOCUSATE SODIUM 100 MG PO CAPS
100.0000 mg | ORAL_CAPSULE | Freq: Two times a day (BID) | ORAL | Status: DC
  Administered 2021-09-04 – 2021-09-05 (×2): 100 mg via ORAL
  Filled 2021-09-04 (×2): qty 1

## 2021-09-04 MED ORDER — ROCURONIUM BROMIDE 10 MG/ML (PF) SYRINGE
PREFILLED_SYRINGE | INTRAVENOUS | Status: DC | PRN
  Administered 2021-09-04: 20 mg via INTRAVENOUS
  Administered 2021-09-04: 100 mg via INTRAVENOUS
  Administered 2021-09-04: 20 mg via INTRAVENOUS

## 2021-09-04 MED ORDER — CEFAZOLIN SODIUM-DEXTROSE 2-4 GM/100ML-% IV SOLN
2.0000 g | Freq: Three times a day (TID) | INTRAVENOUS | Status: AC
  Administered 2021-09-04: 2 g via INTRAVENOUS
  Filled 2021-09-04: qty 100

## 2021-09-04 MED ORDER — LIDOCAINE 2% (20 MG/ML) 5 ML SYRINGE
INTRAMUSCULAR | Status: DC | PRN
  Administered 2021-09-04: 20 mg via INTRAVENOUS

## 2021-09-04 MED ORDER — METFORMIN HCL ER 500 MG PO TB24
500.0000 mg | ORAL_TABLET | Freq: Two times a day (BID) | ORAL | Status: DC
  Administered 2021-09-04 – 2021-09-05 (×2): 500 mg via ORAL
  Filled 2021-09-04 (×3): qty 1

## 2021-09-04 MED ORDER — PROCHLORPERAZINE EDISYLATE 10 MG/2ML IJ SOLN: 5.0000 mg | Freq: Four times a day (QID) | INTRAMUSCULAR | Status: DC | PRN

## 2021-09-04 MED ORDER — FENTANYL CITRATE (PF) 250 MCG/5ML IJ SOLN
INTRAMUSCULAR | Status: DC | PRN
  Administered 2021-09-04 (×3): 50 ug via INTRAVENOUS

## 2021-09-04 MED ORDER — DIPHENHYDRAMINE HCL 12.5 MG/5ML PO ELIX: 12.5000 mg | ORAL_SOLUTION | Freq: Four times a day (QID) | ORAL | Status: DC | PRN

## 2021-09-04 MED ORDER — AMISULPRIDE (ANTIEMETIC) 5 MG/2ML IV SOLN: 10.0000 mg | Freq: Once | INTRAVENOUS | Status: DC | PRN

## 2021-09-04 MED ORDER — MELATONIN 3 MG PO TABS: 3.0000 mg | ORAL_TABLET | Freq: Every evening | ORAL | Status: DC | PRN

## 2021-09-04 MED ORDER — SULFAMETHOXAZOLE-TRIMETHOPRIM 800-160 MG PO TABS: 1.0000 | ORAL_TABLET | Freq: Two times a day (BID) | ORAL | Status: DC

## 2021-09-04 MED ORDER — MIDAZOLAM HCL 2 MG/2ML IJ SOLN
INTRAMUSCULAR | Status: AC
  Administered 2021-09-04: 2 mg via INTRAVENOUS
  Filled 2021-09-04: qty 2

## 2021-09-04 MED ORDER — DEXAMETHASONE SODIUM PHOSPHATE 10 MG/ML IJ SOLN
INTRAMUSCULAR | Status: AC
  Filled 2021-09-04: qty 1

## 2021-09-04 MED ORDER — ORAL CARE MOUTH RINSE: 15.0000 mL | Freq: Once | OROMUCOSAL | Status: AC

## 2021-09-04 MED ORDER — SACUBITRIL-VALSARTAN 49-51 MG PO TABS
1.0000 | ORAL_TABLET | Freq: Two times a day (BID) | ORAL | Status: DC
  Administered 2021-09-04 – 2021-09-05 (×2): 1 via ORAL
  Filled 2021-09-04 (×3): qty 1

## 2021-09-04 MED ORDER — PROPOFOL 10 MG/ML IV BOLUS
INTRAVENOUS | Status: DC | PRN
  Administered 2021-09-04: 40 mg via INTRAVENOUS
  Administered 2021-09-04: 120 mg via INTRAVENOUS

## 2021-09-04 MED ORDER — INDOCYANINE GREEN 25 MG IV SOLR
INTRAVENOUS | Status: DC | PRN
  Administered 2021-09-04: 3 mg via INTRAVENOUS

## 2021-09-04 MED ORDER — NITROGLYCERIN 0.4 MG SL SUBL: 0.4000 mg | SUBLINGUAL_TABLET | SUBLINGUAL | Status: DC | PRN

## 2021-09-04 MED ORDER — GABAPENTIN 100 MG PO CAPS
100.0000 mg | ORAL_CAPSULE | Freq: Two times a day (BID) | ORAL | Status: DC
  Administered 2021-09-04 – 2021-09-05 (×2): 100 mg via ORAL
  Filled 2021-09-04 (×2): qty 1

## 2021-09-04 MED ORDER — CEFAZOLIN SODIUM-DEXTROSE 2-4 GM/100ML-% IV SOLN: 2.0000 g | INTRAVENOUS | Status: DC

## 2021-09-04 MED ORDER — DEXMEDETOMIDINE (PRECEDEX) IN NS 20 MCG/5ML (4 MCG/ML) IV SYRINGE
PREFILLED_SYRINGE | INTRAVENOUS | Status: AC
  Filled 2021-09-04: qty 5

## 2021-09-04 MED ORDER — OLOPATADINE HCL 0.1 % OP SOLN
1.0000 [drp] | Freq: Two times a day (BID) | OPHTHALMIC | Status: DC
  Administered 2021-09-05: 1 [drp] via OPHTHALMIC
  Filled 2021-09-04: qty 5

## 2021-09-04 MED ORDER — CEFAZOLIN SODIUM-DEXTROSE 2-4 GM/100ML-% IV SOLN
2.0000 g | INTRAVENOUS | Status: AC
  Administered 2021-09-04: 2 g via INTRAVENOUS
  Filled 2021-09-04: qty 100

## 2021-09-04 MED ORDER — OXYCODONE HCL 5 MG/5ML PO SOLN: 5.0000 mg | Freq: Once | ORAL | Status: DC | PRN

## 2021-09-04 MED ORDER — 0.9 % SODIUM CHLORIDE (POUR BTL) OPTIME
TOPICAL | Status: DC | PRN
  Administered 2021-09-04: 1000 mL

## 2021-09-04 MED FILL — Medication: INTRAMUSCULAR | Qty: 1 | Status: AC

## 2021-09-04 SURGICAL SUPPLY — 102 items
BAG COUNTER SPONGE SURGICOUNT (BAG) ×8 IMPLANT
BAG DECANTER FOR FLEXI CONT (MISCELLANEOUS) ×4 IMPLANT
BENZOIN TINCTURE PRP APPL 2/3 (GAUZE/BANDAGES/DRESSINGS) IMPLANT
BINDER BREAST LRG (GAUZE/BANDAGES/DRESSINGS) IMPLANT
BINDER BREAST XLRG (GAUZE/BANDAGES/DRESSINGS) IMPLANT
BIOPATCH RED 1 DISK 7.0 (GAUZE/BANDAGES/DRESSINGS) ×8 IMPLANT
BLADE SURG 15 STRL LF DISP TIS (BLADE) IMPLANT
BLADE SURG 15 STRL SS (BLADE)
BNDG COHESIVE 4X5 TAN STRL (GAUZE/BANDAGES/DRESSINGS) ×4 IMPLANT
BNDG ELASTIC 6X5.8 VLCR STR LF (GAUZE/BANDAGES/DRESSINGS) ×4 IMPLANT
BNDG GAUZE ELAST 4 BULKY (GAUZE/BANDAGES/DRESSINGS) IMPLANT
CANISTER SUCT 3000ML PPV (MISCELLANEOUS) ×4 IMPLANT
CHLORAPREP W/TINT 26 (MISCELLANEOUS) ×8 IMPLANT
CLIP VESOCCLUDE LG 6/CT (CLIP) ×4 IMPLANT
CLIP VESOCCLUDE MED 6/CT (CLIP) ×8 IMPLANT
CLIP VESOCCLUDE SM WIDE 6/CT (CLIP) IMPLANT
CNTNR URN SCR LID CUP LEK RST (MISCELLANEOUS) IMPLANT
CONT SPEC 4OZ STRL OR WHT (MISCELLANEOUS)
COVER PROBE W GEL 5X96 (DRAPES) ×8 IMPLANT
COVER SURGICAL LIGHT HANDLE (MISCELLANEOUS) ×4 IMPLANT
DECANTER SPIKE VIAL GLASS SM (MISCELLANEOUS) IMPLANT
DERMABOND ADVANCED (GAUZE/BANDAGES/DRESSINGS) ×2
DERMABOND ADVANCED .7 DNX12 (GAUZE/BANDAGES/DRESSINGS) ×6 IMPLANT
DRAIN CHANNEL 15F RND FF W/TCR (WOUND CARE) IMPLANT
DRAIN CHANNEL 19F RND (DRAIN) ×8 IMPLANT
DRAPE CHEST BREAST 15X10 FENES (DRAPES) ×4 IMPLANT
DRAPE HALF SHEET 70X43 (DRAPES) ×8 IMPLANT
DRAPE ORTHO SPLIT 77X108 STRL (DRAPES) ×8
DRAPE SURG ORHT 6 SPLT 77X108 (DRAPES) ×6 IMPLANT
DRSG PAD ABDOMINAL 8X10 ST (GAUZE/BANDAGES/DRESSINGS) ×20 IMPLANT
DRSG TEGADERM 4X10 (GAUZE/BANDAGES/DRESSINGS) IMPLANT
DRSG TEGADERM 4X4.75 (GAUZE/BANDAGES/DRESSINGS) ×8 IMPLANT
ELECT BLADE 4.0 EZ CLEAN MEGAD (MISCELLANEOUS) ×4
ELECT CAUTERY BLADE 6.4 (BLADE) ×4 IMPLANT
ELECT COATED BLADE 2.86 ST (ELECTRODE) ×4 IMPLANT
ELECT REM PT RETURN 9FT ADLT (ELECTROSURGICAL) ×4
ELECTRODE BLDE 4.0 EZ CLN MEGD (MISCELLANEOUS) ×3 IMPLANT
ELECTRODE REM PT RTRN 9FT ADLT (ELECTROSURGICAL) ×3 IMPLANT
EVACUATOR SILICONE 100CC (DRAIN) ×8 IMPLANT
FILTER STRAW FLUID ASPIR (MISCELLANEOUS) IMPLANT
GAUZE SPONGE 4X4 12PLY STRL (GAUZE/BANDAGES/DRESSINGS) IMPLANT
GLOVE SRG 8 PF TXTR STRL LF DI (GLOVE) ×6 IMPLANT
GLOVE SURG ENC MOIS LTX SZ6 (GLOVE) ×4 IMPLANT
GLOVE SURG ENC TEXT LTX SZ7 (GLOVE) ×4 IMPLANT
GLOVE SURG ENC TEXT LTX SZ7.5 (GLOVE) ×8 IMPLANT
GLOVE SURG UNDER LTX SZ6.5 (GLOVE) ×4 IMPLANT
GLOVE SURG UNDER POLY LF SZ8 (GLOVE) ×8
GOWN STRL REUS W/ TWL LRG LVL3 (GOWN DISPOSABLE) ×9 IMPLANT
GOWN STRL REUS W/TWL 2XL LVL3 (GOWN DISPOSABLE) ×4 IMPLANT
GOWN STRL REUS W/TWL LRG LVL3 (GOWN DISPOSABLE) ×12
GRAFT FLEX HD 19X22X0.7-1.4 (Tissue) ×8 IMPLANT
ILLUMINATOR WAVEGUIDE N/F (MISCELLANEOUS) IMPLANT
IMPL EXPANDER BREAST 375CC (Breast) ×6 IMPLANT
IMPLANT BREAST 375CC (Breast) ×2 IMPLANT
IMPLANT EXPANDER BREAST 375CC (Breast) ×6 IMPLANT
KIT BASIN OR (CUSTOM PROCEDURE TRAY) ×4 IMPLANT
KIT FILL SYSTEM UNIVERSAL (SET/KITS/TRAYS/PACK) IMPLANT
KIT TURNOVER KIT B (KITS) ×4 IMPLANT
LIGHT WAVEGUIDE WIDE FLAT (MISCELLANEOUS) ×4 IMPLANT
MARKER SKIN DUAL TIP RULER LAB (MISCELLANEOUS) ×4 IMPLANT
NEEDLE 18GX1X1/2 (RX/OR ONLY) (NEEDLE) IMPLANT
NEEDLE HYPO 25GX1X1/2 BEV (NEEDLE) IMPLANT
NEEDLE HYPO 25X1 1.5 SAFETY (NEEDLE) IMPLANT
NEEDLE SPNL 18GX3.5 QUINCKE PK (NEEDLE) ×4 IMPLANT
NEEDLE SPNL 22GX3.5 QUINCKE BK (NEEDLE) ×4 IMPLANT
NS IRRIG 1000ML POUR BTL (IV SOLUTION) ×8 IMPLANT
PACK GENERAL/GYN (CUSTOM PROCEDURE TRAY) ×4 IMPLANT
PACK SPY-PHI (KITS) ×8 IMPLANT
PACK UNIVERSAL I (CUSTOM PROCEDURE TRAY) ×4 IMPLANT
PAD ARMBOARD 7.5X6 YLW CONV (MISCELLANEOUS) ×4 IMPLANT
PENCIL SMOKE EVACUATOR (MISCELLANEOUS) ×4 IMPLANT
PIN SAFETY STERILE (MISCELLANEOUS) ×4 IMPLANT
RETRACTOR ONETRAX LX 135X30 (MISCELLANEOUS) IMPLANT
SLEEVE SCD COMPRESS KNEE MED (STOCKING) ×4 IMPLANT
SPECIMEN JAR X LARGE (MISCELLANEOUS) ×8 IMPLANT
SPONGE T-LAP 18X18 ~~LOC~~+RFID (SPONGE) ×20 IMPLANT
STAPLER INSORB 30 2030 C-SECTI (MISCELLANEOUS) ×4 IMPLANT
STAPLER VISISTAT 35W (STAPLE) ×4 IMPLANT
STOCKINETTE IMPERVIOUS 9X36 MD (GAUZE/BANDAGES/DRESSINGS) ×4 IMPLANT
STRIP CLOSURE SKIN 1/2X4 (GAUZE/BANDAGES/DRESSINGS) IMPLANT
SUT ETHILON 2 0 FS 18 (SUTURE) ×4 IMPLANT
SUT MON AB 3-0 SH 27 (SUTURE)
SUT MON AB 3-0 SH27 (SUTURE) IMPLANT
SUT MON AB 4-0 PC3 18 (SUTURE) IMPLANT
SUT PDS AB 3-0 SH 27 (SUTURE) ×20 IMPLANT
SUT PLAIN 5 0 P 3 18 (SUTURE) IMPLANT
SUT SILK 2 0 (SUTURE) ×4
SUT SILK 2 0 PERMA HAND 18 BK (SUTURE) ×12 IMPLANT
SUT SILK 2-0 18XBRD TIE 12 (SUTURE) ×3 IMPLANT
SUT VIC AB 3-0 SH 8-18 (SUTURE) ×4 IMPLANT
SUT VLOC 180 0 24IN GS25 (SUTURE) ×8 IMPLANT
SUT VLOC 90 P-14 23 (SUTURE) ×8 IMPLANT
SYR 50ML LL SCALE MARK (SYRINGE) ×8 IMPLANT
SYR BULB IRRIG 60ML STRL (SYRINGE) ×4 IMPLANT
SYR CONTROL 10ML LL (SYRINGE) IMPLANT
TAPE MEASURE VINYL STERILE (MISCELLANEOUS) ×4 IMPLANT
TOWEL GREEN STERILE (TOWEL DISPOSABLE) ×4 IMPLANT
TOWEL GREEN STERILE FF (TOWEL DISPOSABLE) IMPLANT
TRACER MAGTRACE VIAL (MISCELLANEOUS) ×4 IMPLANT
TRAY FOLEY W/BAG SLVR 14FR LF (SET/KITS/TRAYS/PACK) ×4 IMPLANT
TUBE CONNECTING 12X1/4 (SUCTIONS) ×4 IMPLANT
TUBE CONNECTING 20X1/4 (TUBING) ×4 IMPLANT

## 2021-09-04 NOTE — H&P (Signed)
PROVIDER: Georgianne Fick, MD  MRN: [id number] DOB: 07/28/1951 DATE OF ENCOUNTER: 08/15/2021 Subjective   Chief Complaint: No chief complaint on file.   History of Present Illness: Jordan Willis is a 70 y.o. female who is seen today for follow up.   Pt is a 70 yo F with a right breast cancer dx 05/2021. She has BRCA2 genetic mutation and has had left breast cancer in 2006 with chemo/breast conservation and XRT. She then had right breast cancer (dcis) with lumpectomy and radiation only. Dr. Bubba Camp was her surgeon. She presented this time with a palpable painful mass in the upper outer right breast. Dx imaging showed a 1.2 cm mass at 10 o'clock. 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 The cardiomyopathy recovered, but the neuropathy did not. Her sister and mother have both had breast cancer.   She was set up for bilateral mastectomies and R SLN bx last week which unfortunately had to be cancelled as I was ill. She is here to discuss surgery again. She does not want chemo.   Review of Systems: A complete review of systems was obtained from the patient. I have reviewed this information and discussed as appropriate with the patient. See HPI as well for other ROS.  ROS  Otherwise negative.   Medical History: Past Medical History:  Diagnosis Date   Arthritis   Diabetes mellitus without complication (CMS-HCC)   History of cancer   Patient Active Problem List  Diagnosis   Malignant neoplasm of upper-outer quadrant of right breast in female, estrogen receptor positive (CMS-HCC)   History of right breast cancer   History of left breast cancer   BRCA2 positive   Past Surgical History:  Procedure Laterality Date   fibroid tumor N/A   HYSTERECTOMY   MASTECTOMY PARTIAL / LUMPECTOMY N/A    Allergies  Allergen Reactions   Aluminum Aspirin Palpitations   Aspirin Palpitations  Heart  fluttering Heart fluttering   Adhesive Rash   Adhesive Tape-Silicones Rash   Current Outpatient Medications on File Prior to Visit  Medication Sig Dispense Refill   amoxicillin-clavulanate (AUGMENTIN) 875-125 mg tablet amoxicillin 875 mg-potassium clavulanate 125 mg tablet   aspirin 81 MG chewable tablet 1 tablet   atorvastatin (LIPITOR) 20 MG tablet Take 1 tablet by mouth once daily   blood glucose diagnostic (ONETOUCH VERIO TEST STRIPS) test strip TEST ONCE DAILY AS DIRECTED   chromium picolinate 1,000 mcg Tab chromium picolinate 1,000 mcg tablet 1000 ugs by oral route.   codeine-guaifenesin 10-100 mg/5 mL oral liquid codeine 10 mg-guaifenesin 100 mg/5 mL oral liquid TAKE 10 MLS BY MOUTH 4 TIMES A DAY AS NEEDED FOR COUGH   cyanocobalamin, vitamin B-12, 5,000 mcg Cap Take by mouth   fluticasone propionate (FLONASE) 50 mcg/actuation nasal spray Place 2 sprays into both nostrils once daily   HYDROcodone-acetaminophen (NORCO) 5-325 mg tablet hydrocodone 5 mg-acetaminophen 325 mg tablet TAKE 1 TABLET BY MOUTH THREE TIMES A DAY AS NEEDED FOR 7 DAYS   ibuprofen (MOTRIN) 600 MG tablet ibuprofen 600 mg tablet   lancets (ONETOUCH DELICA LANCETS) TEST ONCE DAILY AS DIRECTED   metFORMIN (GLUCOPHAGE) 500 MG tablet Take by mouth   methocarbamoL (ROBAXIN) 750 MG tablet TAKE 1 TABLET BY MOUTH EVERY DAY AT BEDTIME AS NEEDED EAR PAIN   metoprolol succinate (TOPROL-XL) 25 MG XL tablet Take by mouth   nitroGLYcerin (NITROSTAT) 0.4 MG SL tablet Place under  the tongue   predniSONE (DELTASONE) 20 MG tablet prednisone 20 mg tablet   sacubitriL-valsartan (ENTRESTO) 24-26 mg tablet 1   No current facility-administered medications on file prior to visit.   Family History  Problem Relation Age of Onset   High blood pressure (Hypertension) Mother   Hyperlipidemia (Elevated cholesterol) Mother   Breast cancer Mother   High blood pressure (Hypertension) Father   Diabetes Father   Hyperlipidemia (Elevated  cholesterol) Sister   Breast cancer Sister    Social History   Tobacco Use  Smoking Status Never Smoker  Smokeless Tobacco Never Used    Social History   Socioeconomic History   Marital status: Divorced  Tobacco Use   Smoking status: Never Smoker   Smokeless tobacco: Never Used  Scientific laboratory technician Use: Never used  Substance and Sexual Activity   Alcohol use: Never   Drug use: Never   Objective:   This visit is entirely spent in counseling.   Assessment and Plan:  Diagnoses and all orders for this visit:  Malignant neoplasm of upper-outer quadrant of right breast in female, estrogen receptor positive (CMS-HCC) Assessment & Plan: Plan is bilateral mastectomies with reconstruction and attempt for remapping of the right axillary sentinel node basin.  I reviewed surgery again and answered questions. She has a new date set up.   BRCA2 positive  I answered patient and family questions.   No follow-ups on file.  Georgianne Fick, MD

## 2021-09-04 NOTE — Anesthesia Procedure Notes (Addendum)
Procedure Name: Intubation Date/Time: 09/04/2021 9:50 AM Performed by: Charyl Dancer, RN Pre-anesthesia Checklist: Patient identified, Emergency Drugs available, Suction available and Patient being monitored Patient Re-evaluated:Patient Re-evaluated prior to induction Oxygen Delivery Method: Circle System Utilized Preoxygenation: Pre-oxygenation with 100% oxygen Induction Type: IV induction Ventilation: Mask ventilation without difficulty and Oral airway inserted - appropriate to patient size Laryngoscope Size: Mac and 3 Grade View: Grade II Tube type: Oral Tube size: 7.0 mm Number of attempts: 1 Airway Equipment and Method: Stylet and Oral airway Placement Confirmation: ETT inserted through vocal cords under direct vision, positive ETCO2 and breath sounds checked- equal and bilateral Secured at: 22 cm Tube secured with: Tape Dental Injury: Teeth and Oropharynx as per pre-operative assessment  Comments: Performed by Tally Joe under the supervision of CRNA and MDA

## 2021-09-04 NOTE — Op Note (Signed)
Operative Note   DATE OF OPERATION: 09/04/2021  SURGICAL DEPARTMENT: Plastic Surgery  PREOPERATIVE DIAGNOSES: Breast cancer  POSTOPERATIVE DIAGNOSES:  same  PROCEDURE: 1.  Bilateral breast reconstruction with tissue expanders and acellular dermal matrix 2.  Indocyanine green angiography bilateral mastectomy flaps  SURGEON: Talmadge Coventry, MD  ASSISTANT: Verdie Shire, PA The advanced practice practitioner (APP) assisted throughout the case.  The APP was essential in retraction and counter traction when needed to make the case progress smoothly.  This retraction and assistance made it possible to see the tissue planes for the procedure.  The assistance was needed for hemostasis, tissue re-approximation and closure of the incision site.   ANESTHESIA:  General.   COMPLICATIONS: None.   INDICATIONS FOR PROCEDURE:  The patient, Jordan Willis is a 70 y.o. female born on Feb 28, 1951, is here for treatment of bilateral mastectomy defects MRN: [id number]  CONSENT:  Informed consent was obtained directly from the patient. Risks, benefits and alternatives were fully discussed. Specific risks including but not limited to bleeding, infection, hematoma, seroma, scarring, pain, contracture, asymmetry, wound healing problems, and need for further surgery were all discussed. The patient did have an ample opportunity to have questions answered to satisfaction.   DESCRIPTION OF PROCEDURE:  The patient was taken to the operating room. SCDs were placed and antibiotics were given.  General anesthesia was administered.  The patient's operative site was prepped and draped in a sterile fashion. A time out was performed and all information was confirmed to be correct.  [name] started by performing her portion of the case.  This was bilateral skin sparing mastectomies with right sentinel lymph node biopsy.  Patient was then turned over to me.  I started by examining the skin flaps.  On the left side  everything looked to be healthy with the exception of a small area laterally where there is a small buttonhole.  The skin was particularly thin in this area due to her previous surgery and radiation.  On the right side an ellipse of skin was taken around the nipple areolar complex and a secondary ellipse superficial to the cancer which was medial.  All the remaining skin looks to be well perfused clinically.  Indocyanine green angiography was then performed.  All areas look to fill nicely with the exception of the area laterally on the left side which was excised and sent for pathology.  All the tissue on the right side looks healthy so the skin bridge between the 2 wounds was left intact.  And for meticulous hemostasis on both sides.  Lateral soft tissues were tacked down to the chest wall with 0 V-Loc on both sides.  94 French drains were placed and secured with nylon sutures.  Flex HD pliable pre-Peace's were then brought onto the field and 3-0 PDS was run around the periphery with a pursestring.  The tissue expanders were then brought onto the field.  These were Mentor Arturo plus smooth tissue expanders with a prescribed fill volume of 375 cc.  Serial number on the left side is [id number].  Serial number on the right side [id number].  All the air was removed from the expanders.  They were then placed within the matrix and the 3-0 PDS pursestring was tied down.  Suture tabs were brought out at 3, 6, and 9:00.  3-0 PDS stay sutures were then placed in the chest wall at 3, 6, and 9:00 on each side.  Both pockets were then copiously irrigated with triple antibiotic  solution.  The stay sutures were then passed through the suture tabs and the expanders were placed in the stay sutures tied down.  Each expander was then inflated with 200 cc of saline.  Incisions were then closed with buried Enzor staples and a running 3 OV lock.  Dermabond was applied.  This was followed by a soft compressive dressing.  The patient  tolerated the procedure well.  There were no complications. The patient was allowed to wake from anesthesia, extubated and taken to the recovery room in satisfactory condition.

## 2021-09-04 NOTE — Anesthesia Postprocedure Evaluation (Signed)
Anesthesia Post Note  Patient: MADHURI VACCA  Procedure(s) Performed: BILATERAL MASTECTOMIES WITH RIGHT SENTINEL LYMPH NODE BIOPSY (Right: Breast) BILATERAL BREAST RECONSTRUCTION WITH PLACEMENT OF TISSUE EXPANDER AND FLEX HD (ACELLULAR HYDRATED DERMIS) (Bilateral: Breast)     Patient location during evaluation: PACU Anesthesia Type: Regional and General Level of consciousness: awake and alert, oriented and patient cooperative Pain management: pain level controlled Vital Signs Assessment: post-procedure vital signs reviewed and stable Respiratory status: spontaneous breathing, nonlabored ventilation and respiratory function stable Cardiovascular status: blood pressure returned to baseline and stable Postop Assessment: no apparent nausea or vomiting Anesthetic complications: no   No notable events documented.  Last Vitals:  Vitals:   09/04/21 0922 09/04/21 1400  BP: 117/89 (!) 161/70  Pulse: 79 (!) 101  Resp: 20 16  Temp:  37.1 C  SpO2: 98% 99%    Last Pain:  Vitals:   09/04/21 1400  TempSrc:   PainSc: [place]

## 2021-09-04 NOTE — Progress Notes (Signed)
Pt has no floor orders, or anything for pain. Got [name] number from PACU and paged her no answer. Left a message for [name] as well have not heard back yet. RN called Dr. Gabriel Carina and left message with the on call MD

## 2021-09-04 NOTE — Op Note (Signed)
Right Mastectomy with Sentinel Node Biopsy (dual tracer), left mastectomy  Indications: This patient presents with history of BRCA2, h/o left breast cancer, history right breast cancer, now with new right breast cancer.    Pre-operative Diagnosis: Right breast cancer, [id number], upper outer quadrant, receptors +/-/+  Post-operative Diagnosis: same  Surgeon: Stark Klein   Assist: Carlena Hurl, PA-C  Anesthesia: General endotracheal anesthesia and pectoral block  ASA Class: 3  Procedure Details  The patient was seen in the Holding Room. The risks, benefits, complications, treatment options, and expected outcomes were discussed with the patient. The possibilities of reaction to medication, pulmonary aspiration, bleeding, infection, the need for additional procedures, failure to diagnose a condition, and creating a complication requiring transfusion or operation were discussed with the patient. The patient concurred with the proposed plan, giving informed consent.  The site of surgery properly noted/marked. The patient was taken to Operating Room # 9, identified as Jordan Willis and the procedure verified as Bilateral Mastectomies and right Sentinel Node Biopsy. A Time Out was held and the above information confirmed.  The MagTrae was injected in the subareolar location.    After induction of anesthesia, the left arm, bilateral breast, and chest were prepped and draped in standard fashion.  The borders of the breast were identified and marked. Elliptical incisions were marked incorporating the nipples.   The left side was addressed first.  The incision was made with the #10 blade.  Mastectomy hooks were used to provide elevation of the skin edges, and the cautery was used to create the mastectomy flaps.  The dissection was taken to the fascia of the pectoralis major.  The penetrating vessels were clipped as needed.  The superior flap was taken medially to the lateral sternal border, superiorly to the  inferior border of the clavicle.  The inferior flap was similarly created, inferiorly to the inframammary fold and laterally to the border of the latissimus.  The breast was taken off including the pectoralis fascia and the axillary tail marked.    The right side was addressed similarly, however medially there was some skin that was at the prior cancer site that was inseparable from dense scar. This was also excised as an ellipse given that the skin would be very thin in this location. Also, the superior flap was a bit thicker than desired so an anterior margin was taken at 2:30 to get the flap to an appropriate thickness.    Next, using a hand-held gamma probe and a Sentimag probe, axillary sentinel nodes were identified.  Five small deep level 2 axillary sentinel nodes were removed and submitted to pathology.  The findings are below.  The lymphovascular channels were divided with cautery.        The patient was left with [name] for reconstruction using the expander technique.     Sterile dressings were applied. At the end of the operation, all sponge, instrument, and needle counts were correct.  Findings: grossly clear surgical margins, SLN #1, #3, and #5 hot with Tc99. SLN #2 and #4 were hot with both tracers. Highest cps 1150.   Estimated Blood Loss: <50 mL          Drains: per [name]                Specimens: left breast, right breast, right breast anterior margin 2:30, and five left axillary sentinel nodes         Complications:  None; patient tolerated the procedure well.  Disposition: PACU - hemodynamically stable.         Condition: stable  

## 2021-09-04 NOTE — Interval H&P Note (Signed)
History and Physical Interval Note:  09/04/2021 9:09 AM  Jordan Willis  has presented today for surgery, with the diagnosis of RECURRENT RIGHT BREAST CANCER.  The various methods of treatment have been discussed with the patient and family. After consideration of risks, benefits and other options for treatment, the patient has consented to  Procedure(s): RIGHT MASTECTOMY WITH RIGHT SENTINEL LYMPH NODE BIOPSY (Right) LEFT SIMPLE MASTECTOMY (Left) BILATERAL BREAST RECONSTRUCTION WITH PLACEMENT OF TISSUE EXPANDER AND FLEX HD (ACELLULAR HYDRATED DERMIS) (Bilateral) as a surgical intervention.  The patient's history has been reviewed, patient examined, no change in status, stable for surgery.  I have reviewed the patient's chart and labs.  Questions were answered to the patient's satisfaction.     Stark Klein

## 2021-09-04 NOTE — Anesthesia Procedure Notes (Signed)
Anesthesia Regional Block: Pectoralis block   Pre-Anesthetic Checklist: , timeout performed,  Correct Patient, Correct Site, Correct Laterality,  Correct Procedure, Correct Position, site marked,  Risks and benefits discussed,  Surgical consent,  Pre-op evaluation,  At surgeon's request and post-op pain management  Laterality: Left and Right  Prep: Maximum Sterile Barrier Precautions used, chloraprep       Needles:  Injection technique: Single-shot  Needle Type: Echogenic Stimulator Needle     Needle Length: 9cm  Needle Gauge: 22     Additional Needles:   Procedures:,,,, ultrasound used (permanent image in chart),,    Narrative:  Start time: 09/04/2021 8:55 AM End time: 09/04/2021 9:05 AM Injection made incrementally with aspirations every 5 mL.  Performed by: Personally  Anesthesiologist: Pervis Hocking, DO  Additional Notes: Monitors applied. No increased pain on injection. No increased resistance to injection. Injection made in 5cc increments. Good needle visualization. Patient tolerated procedure well.

## 2021-09-04 NOTE — Transfer of Care (Signed)
Immediate Anesthesia Transfer of Care Note  Patient: Jordan Willis  Procedure(s) Performed: BILATERAL MASTECTOMIES WITH RIGHT SENTINEL LYMPH NODE BIOPSY (Right: Breast) BILATERAL BREAST RECONSTRUCTION WITH PLACEMENT OF TISSUE EXPANDER AND FLEX HD (ACELLULAR HYDRATED DERMIS) (Bilateral: Breast)  Patient Location: PACU  Anesthesia Type:General  Level of Consciousness: drowsy  Airway & Oxygen Therapy: Patient Spontanous Breathing and Patient connected to face mask oxygen  Post-op Assessment: Report given to RN and Post -op Vital signs reviewed and stable  Post vital signs: Reviewed and stable  Last Vitals:  Vitals Value Taken Time  BP 139/62 09/04/21 1359  Temp    Pulse 69 09/04/21 1401  Resp 15 09/04/21 1401  SpO2 98 % 09/04/21 1401  Vitals shown include unvalidated device data.  Last Pain:  Vitals:   09/04/21 0922  TempSrc:   PainSc: 0-No pain      Patients Stated Pain Goal: 3 (18/28/83 3744)  Complications: No notable events documented.

## 2021-09-05 ENCOUNTER — Encounter (HOSPITAL_COMMUNITY): Payer: Self-pay | Admitting: General Surgery

## 2021-09-05 DIAGNOSIS — G4730 Sleep apnea, unspecified: Secondary | ICD-10-CM | POA: Diagnosis not present

## 2021-09-05 DIAGNOSIS — G620 Drug-induced polyneuropathy: Secondary | ICD-10-CM | POA: Diagnosis not present

## 2021-09-05 DIAGNOSIS — M1990 Unspecified osteoarthritis, unspecified site: Secondary | ICD-10-CM | POA: Diagnosis not present

## 2021-09-05 DIAGNOSIS — Z803 Family history of malignant neoplasm of breast: Secondary | ICD-10-CM | POA: Diagnosis not present

## 2021-09-05 DIAGNOSIS — Z853 Personal history of malignant neoplasm of breast: Secondary | ICD-10-CM | POA: Diagnosis not present

## 2021-09-05 DIAGNOSIS — C50411 Malignant neoplasm of upper-outer quadrant of right female breast: Secondary | ICD-10-CM | POA: Diagnosis not present

## 2021-09-05 DIAGNOSIS — I110 Hypertensive heart disease with heart failure: Secondary | ICD-10-CM | POA: Diagnosis not present

## 2021-09-05 DIAGNOSIS — Z170 Estrogen receptor positive status [ER+]: Secondary | ICD-10-CM | POA: Diagnosis not present

## 2021-09-05 DIAGNOSIS — Z7984 Long term (current) use of oral hypoglycemic drugs: Secondary | ICD-10-CM | POA: Diagnosis not present

## 2021-09-05 DIAGNOSIS — I509 Heart failure, unspecified: Secondary | ICD-10-CM | POA: Diagnosis not present

## 2021-09-05 DIAGNOSIS — Z923 Personal history of irradiation: Secondary | ICD-10-CM | POA: Diagnosis not present

## 2021-09-05 DIAGNOSIS — E119 Type 2 diabetes mellitus without complications: Secondary | ICD-10-CM | POA: Diagnosis not present

## 2021-09-05 DIAGNOSIS — I427 Cardiomyopathy due to drug and external agent: Secondary | ICD-10-CM | POA: Diagnosis not present

## 2021-09-05 DIAGNOSIS — T451X5A Adverse effect of antineoplastic and immunosuppressive drugs, initial encounter: Secondary | ICD-10-CM | POA: Diagnosis not present

## 2021-09-05 LAB — BASIC METABOLIC PANEL
Anion gap: 9 (ref 5–15)
BUN: 11 mg/dL (ref 8–23)
CO2: 23 mmol/L (ref 22–32)
Calcium: 8.9 mg/dL (ref 8.9–10.3)
Chloride: 102 mmol/L (ref 98–111)
Creatinine, Ser: 0.7 mg/dL (ref 0.44–1.00)
GFR, Estimated: >60 mL/min (ref >60)
Glucose, Bld: 122 mg/dL — ABNORMAL HIGH (ref 70–99)
Potassium: 4.3 mmol/L (ref 3.5–5.1)
Sodium: 134 mmol/L — ABNORMAL LOW (ref 135–145)

## 2021-09-05 LAB — CBC
HCT: 35 % — ABNORMAL LOW (ref 36.0–46.0)
Hemoglobin: 11.3 g/dL — ABNORMAL LOW (ref 12.0–15.0)
MCH: 29 pg (ref 26.0–34.0)
MCHC: 32.3 g/dL (ref 30.0–36.0)
MCV: 90 fL (ref 80.0–100.0)
Platelets: 276 10*3/uL (ref 150–400)
RBC: 3.89 MIL/uL (ref 3.87–5.11)
RDW: 12.8 % (ref 11.5–15.5)
WBC: 10.2 10*3/uL (ref 4.0–10.5)
nRBC: 0 % (ref 0.0–0.2)

## 2021-09-05 LAB — GLUCOSE, CAPILLARY
Glucose-Capillary: 126 mg/dL — ABNORMAL HIGH (ref 70–99)
Glucose-Capillary: 127 mg/dL — ABNORMAL HIGH (ref 70–99)

## 2021-09-05 MED ORDER — OXYCODONE HCL 5 MG PO TABS: 5.0000 mg | ORAL_TABLET | Freq: Four times a day (QID) | ORAL | 0 refills | Status: DC | PRN

## 2021-09-05 MED ORDER — GABAPENTIN 100 MG PO CAPS: 100.0000 mg | ORAL_CAPSULE | Freq: Two times a day (BID) | ORAL | 1 refills | Status: DC

## 2021-09-05 MED ORDER — METHOCARBAMOL 500 MG PO TABS: 500.0000 mg | ORAL_TABLET | Freq: Four times a day (QID) | ORAL | 1 refills | Status: DC | PRN

## 2021-09-05 NOTE — Progress Notes (Incomplete)
Patient given discharge instructions and stated understanding. 

## 2021-09-05 NOTE — Discharge Summary (Signed)
Physician Discharge Summary  Patient ID: Jordan Willis MRN: [id number] DOB/AGE: 1951/08/31 70 y.o.  Admit date: 09/04/2021 Discharge date: 09/05/2021  Admission Diagnoses:  Discharge Diagnoses:  Active Problems:   Ductal carcinoma in situ (DCIS) of right breast   Breast cancer of upper-outer quadrant of right female breast [facility])   Discharged Condition: good  Hospital Course: 70 year old female presented to the [facility] operating room on 09/04/2021 for right mastectomy with sentinel lymph node biopsy and left mastectomy by [name] followed by immediate bilateral breast reconstruction with placement of tissue expanders and Flex HD with [name].  Patient stayed overnight for observation and pain control.  Patient is doing well this morning, daughter at bedside.  Reports a lot of pain last night, but controlled with medication.  She is not having any fevers or chills.  She reports that she has not had much to eat since surgery.  She is planning to have some breakfast this morning.  She has been drinking plenty of fluids and is urinating normally.  No acute overnight events.  She is ready for discharge  Consults: None  Significant Diagnostic Studies: none  Treatments: IV hydration, analgesia: acetaminophen, oxycodone, Dilaudid  Discharge Exam: Blood pressure (!) 142/66, pulse 75, temperature 98.8 F (37.1 C), temperature source Oral, resp. rate 17, height 5\' 8"  (1.727 m), weight 88.9 kg, SpO2 97 %. General appearance: alert, cooperative, no distress, and resting in bed, daughter at bedside Head: Normocephalic, without obvious abnormality, atraumatic Resp: Unlabored Breasts: Mastectomy incisions intact, mastectomy flaps are viable.  No subcutaneous fluid collections noted with palpation.  No bruising noted.  Bilateral JP drains are in place with serosanguineous drainage in bulbs.  No significant swelling noted. Extremities: extremities normal, atraumatic, no cyanosis or edema Pulses:  2+ and symmetric Skin: Skin color, texture, turgor normal. No rashes or lesions  Disposition: Discharge disposition: 01-Home or Self Care       Discharge Instructions     Call MD for:  difficulty breathing, headache or visual disturbances   Complete by: As directed    Call MD for:  difficulty breathing, headache or visual disturbances   Complete by: As directed    Call MD for:  extreme fatigue   Complete by: As directed    Call MD for:  hives   Complete by: As directed    Call MD for:  hives   Complete by: As directed    Call MD for:  persistant dizziness or light-headedness   Complete by: As directed    Call MD for:  persistant nausea and vomiting   Complete by: As directed    Call MD for:  persistant nausea and vomiting   Complete by: As directed    Call MD for:  redness, tenderness, or signs of infection (pain, swelling, redness, odor or green/yellow discharge around incision site)   Complete by: As directed    Call MD for:  redness, tenderness, or signs of infection (pain, swelling, redness, odor or green/yellow discharge around incision site)   Complete by: As directed    Call MD for:  severe uncontrolled pain   Complete by: As directed    Call MD for:  severe uncontrolled pain   Complete by: As directed    Call MD for:  temperature >100.4   Complete by: As directed    Call MD for:  temperature >100.4   Complete by: As directed    Change dressing (specify)   Complete by: As directed  Measure and record drain output at least twice daily.  Bring record to clinic.   Diet - low sodium heart healthy   Complete by: As directed    Diet - low sodium heart healthy   Complete by: As directed    Increase activity slowly   Complete by: As directed    Increase activity slowly   Complete by: As directed       Allergies as of 09/05/2021       Reactions   Aspirin Palpitations, Other (See Comments)   Heart fluttering, pt takes 40.5 mg (half of 81 mg) daily   Tape Rash         Medication List     TAKE these medications    acetaminophen 500 MG tablet Commonly known as: TYLENOL Take 1,000 mg by mouth every 6 (six) hours as needed for moderate pain.   aspirin 81 MG EC tablet Take 40.5 mg by mouth daily. Swallow whole.   atorvastatin 20 MG tablet Commonly known as: LIPITOR TAKE 1 TABLET BY MOUTH EVERYDAY AT BEDTIME   cetirizine 10 MG tablet Commonly known as: ZYRTEC Take 10 mg by mouth daily.   Entresto 49-51 MG Generic drug: sacubitril-valsartan TAKE 1 TABLET BY MOUTH TWICE A DAY   fluticasone 50 MCG/ACT nasal spray Commonly known as: FLONASE Place 2 sprays into both nostrils daily as needed for allergies.   gabapentin 100 MG capsule Commonly known as: NEURONTIN Take 1 capsule (100 mg total) by mouth 2 (two) times daily.   GLUCOSAMINE-CHONDROITIN PO Take 1 tablet by mouth every evening.   HAIR/SKIN/NAILS PO Take 1 tablet by mouth daily.   letrozole 2.5 MG tablet Commonly known as: FEMARA Take 1 tablet (2.5 mg total) by mouth daily.   metFORMIN 500 MG 24 hr tablet Commonly known as: GLUCOPHAGE-XR Take 500 mg by mouth 2 (two) times daily.   methocarbamol 500 MG tablet Commonly known as: ROBAXIN Take 1 tablet (500 mg total) by mouth every 6 (six) hours as needed for muscle spasms.   metoprolol succinate 25 MG 24 hr tablet Commonly known as: TOPROL-XL Take 25 mg by mouth daily.   multivitamin with minerals Tabs tablet Take 1 tablet by mouth daily.   multivitamin-lutein Caps capsule Take 1 capsule by mouth daily.   nitroGLYCERIN 0.4 MG SL tablet Commonly known as: NITROSTAT Place 0.4 mg under the tongue every 5 (five) minutes as needed for chest pain.   Olopatadine HCl 0.2 % Soln Place 1 drop into both eyes daily.   ondansetron 4 MG tablet Commonly known as: Zofran Take 1 tablet (4 mg total) by mouth every 8 (eight) hours as needed for nausea or vomiting.   OneTouch Delica Lancets 08M Misc Apply topically.    OneTouch Verio test strip Generic drug: glucose blood TEST ONCE DAILY AS DIRECTED 90   OVER THE COUNTER MEDICATION Take 1 capsule by mouth daily. Four Function Brain Support   oxyCODONE 5 MG immediate release tablet Commonly known as: Oxy IR/ROXICODONE Take 1-2 tablets (5-10 mg total) by mouth every 6 (six) hours as needed for severe pain or breakthrough pain.   ProAir HFA 108 (90 Base) MCG/ACT inhaler Generic drug: albuterol Inhale 2 puffs into the lungs every 6 (six) hours as needed for wheezing or shortness of breath.   PROBIOTIC DAILY PO Take 1 capsule by mouth daily.   sertraline 50 MG tablet Commonly known as: ZOLOFT Take 50 mg by mouth daily.   solifenacin 10 MG tablet Commonly known as: VESICARE  Take 10 mg by mouth daily as needed (bladder control).   SUPER B COMPLEX/C PO Take 1 tablet by mouth daily.   traMADol 50 MG tablet Commonly known as: ULTRAM Take 50 mg by mouth every 6 (six) hours as needed for severe pain.   traZODone 50 MG tablet Commonly known as: DESYREL Take 50 mg by mouth at bedtime as needed for sleep.   ULTRA LUBRICANT EYE DROPS OP Place 1 drop into both eyes 3 (three) times daily as needed (for dry eyes).   Vitamin D3 25 MCG (1000 UT) Caps Take 2,000 Units by mouth daily.               Discharge Care Instructions  (From admission, onward)           Start     Ordered   09/05/21 0000  Change dressing (specify)       Comments: Measure and record drain output at least twice daily.  Bring record to clinic.   09/05/21 0859            Follow-up Information     Stark Klein, MD Follow up in 2 week(s).   Specialty: General Surgery Contact information: [place] [address] [phone]         [name], MD Follow up in 1 week(s).   Specialty: Plastic Surgery Contact information: [address] [place] [address] [facility] [address] [place], [place] [address] [phone]  Signed: Carola Rhine Nahdia Doucet 09/05/2021, 9:37 AM

## 2021-09-05 NOTE — Discharge Instructions (Signed)
Activity As tolerated:  NO showers for 3 days. Keep ACE wrap on breasts until then. After showering, put ACE wrap back on, this is important for compression. NO driving while in pain, taking pain medication or if you are unable to safely react to traffic. No heavy activities Take Pain medication (Norco) as needed for severe pain. Otherwise, you can use ibuprofen or tylenol as needed. Avoid more than 3,000 mg of tylenol in 24 hours. Norco has 325mg  of tylenol per dose.  Diet: Regular. Drink plenty of fluids and eat healthy, high protein, low carbs.  Wound Care: Keep dressing clean & dry. You may change bandages after showering if you continue to notice some drainage. You can reuse bandages if they are not dirty/soiled.  Drains: You have drains in place, monitor the output of these drains daily and record. Continue to take antibiotic while drain is in place.  Special Instructions: Call Doctor if any unusual problems occur such as pain, excessive Bleeding, unrelieved Nausea/vomiting, Fever &/or chills  Follow-up appointment: Scheduled for next week.

## 2021-09-06 LAB — SURGICAL PATHOLOGY

## 2021-09-07 ENCOUNTER — Encounter: Payer: Self-pay | Admitting: *Deleted

## 2021-09-11 ENCOUNTER — Other Ambulatory Visit: Payer: Self-pay | Admitting: Endocrinology

## 2021-09-11 DIAGNOSIS — E042 Nontoxic multinodular goiter: Secondary | ICD-10-CM

## 2021-09-11 NOTE — Progress Notes (Signed)
Patient Care Team: [name], MD as PCP - General (Internal Medicine) Delsa Bern, MD as Attending Physician (Obstetrics and Gynecology) Posey Boyer, MD (Inactive) as Consulting Physician (Family Medicine) Rockwell Germany, RN as Oncology Nurse Navigator Mauro Kaufmann, RN as Oncology Nurse Navigator  DIAGNOSIS:    ICD-10-CM   1. Malignant neoplasm of upper-outer quadrant of left breast in female, estrogen receptor negative ([place])  C50.412 ECHOCARDIOGRAM COMPLETE   Z17.1       SUMMARY OF ONCOLOGIC HISTORY: Oncology History  Malignant neoplasm of upper-outer quadrant of left breast in female, estrogen receptor negative ([place])  02/02/2005 Initial Diagnosis   Left breast needle core biopsy showed invasive mammary cancer   02/11/2005 Breast MRI   Left breast upper outer quadrant: 4.4 x 2.6 x 3.7 cm mass and a smaller adjacent mass measuring 1 x 0.8 x 0.4 cm   03/02/2005 - 07/20/2005 Neo-Adjuvant Chemotherapy   AC4 followed by Taxotere 4   08/03/2005 Surgery   Left breast lumpectomy: 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%, PR 0%, HER-2 negative, Ki-67 31%   09/26/2005 - 11/13/2005 Radiation Therapy   Adjuvant radiation therapy   03/18/2008 Initial Biopsy   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   03/22/2008 Surgery   Right breast lumpectomy 0.9 cm DCIS ER 90% PR 98% stage 0   04/11/2008 Procedure   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   05/10/2008 - 07/02/2008 Radiation Therapy   Adjuvant radiation therapy   07/17/2008 - 04/16/2009 Anti-estrogen oral therapy   Femara then switched to tamoxifen which was discontinued in 2010 due to concern about uterine cancer   05/23/2021 Relapse/Recurrence   Screening detected right breast mass at 10 o'clock position measuring 1.2 cm, ultrasound-guided biopsy revealed grade 3 IDC ER 20%, PR 0%, HER2 positive 3+, Ki-67 50%    05/31/2021 Cancer Staging   Staging form: Breast, AJCC 7th Edition - Clinical: Stage IA (rT1c, N0, M0) - Signed by Jordan Lose, MD on 05/31/2021 Stage prefix: Recurrence Laterality: Right Biopsy of metastatic site performed: No Estrogen receptor status: Positive Progesterone receptor status: Negative HER2 status: Positive    09/12/2021 Cancer Staging   Staging form: Breast, AJCC 7th Edition - Pathologic: Stage IIA (T2, N0, cM0) - Signed by Jordan Lose, MD on 09/12/2021 Estrogen receptor status: Positive Progesterone receptor status: Positive HER2 status: Negative      CHIEF COMPLIANT: Follow-up of right breast cancer  INTERVAL HISTORY: Jordan Willis is a 70 y.o. with above-mentioned history of right breast cancer. Bilateral mastectomies on 09/04/2021 showed no evidence of malignancy in the left breast, invasive ductal carcinoma and DCIS in the right breast, and all lymph nodes negative for carcinoma. She presents to the clinic today for follow-up and to review the pathology report.  She is healing and recovering very well from recent surgery.  She does have significant pain and discomfort.  ALLERGIES:  is allergic to aspirin and tape.  MEDICATIONS:  Current Outpatient Medications  Medication Sig Dispense Refill   acetaminophen (TYLENOL) 500 MG tablet Take 1,000 mg by mouth every 6 (six) hours as needed for moderate pain.     aspirin 81 MG EC tablet Take 40.5 mg by mouth daily. Swallow whole.     atorvastatin (LIPITOR) 20 MG tablet TAKE 1 TABLET BY MOUTH EVERYDAY AT BEDTIME 90 tablet 1   Biotin w/ Vitamins C & E (HAIR/SKIN/NAILS PO) Take  1 tablet by mouth daily.     cetirizine (ZYRTEC) 10 MG tablet Take 10 mg by mouth daily.     Cholecalciferol (VITAMIN D3) 25 MCG (1000 UT) CAPS Take 2,000 Units by mouth daily. (Patient not taking: Reported on 08/28/2021)     ENTRESTO 49-51 MG TAKE 1 TABLET BY MOUTH TWICE A DAY 180 tablet 0   fluticasone (FLONASE) 50 MCG/ACT nasal spray Place 2  sprays into both nostrils daily as needed for allergies.     gabapentin (NEURONTIN) 100 MG capsule Take 1 capsule (100 mg total) by mouth 2 (two) times daily. 30 capsule 1   GLUCOSAMINE-CHONDROITIN PO Take 1 tablet by mouth every evening.      glucose blood (ONETOUCH VERIO) test strip TEST ONCE DAILY AS DIRECTED 90     metFORMIN (GLUCOPHAGE-XR) 500 MG 24 hr tablet Take 500 mg by mouth 2 (two) times daily.     methocarbamol (ROBAXIN) 500 MG tablet Take 1 tablet (500 mg total) by mouth every 6 (six) hours as needed for muscle spasms. 20 tablet 1   metoprolol succinate (TOPROL-XL) 25 MG 24 hr tablet Take 25 mg by mouth daily.     Multiple Vitamin (MULTIVITAMIN WITH MINERALS) TABS tablet Take 1 tablet by mouth daily.     multivitamin-lutein (OCUVITE-LUTEIN) CAPS capsule Take 1 capsule by mouth daily.     nitroGLYCERIN (NITROSTAT) 0.4 MG SL tablet Place 0.4 mg under the tongue every 5 (five) minutes as needed for chest pain.     Olopatadine HCl 0.2 % SOLN Place 1 drop into both eyes daily.     ondansetron (ZOFRAN) 4 MG tablet Take 1 tablet (4 mg total) by mouth every 8 (eight) hours as needed for nausea or vomiting. 20 tablet 0   OneTouch Delica Lancets 76E MISC Apply topically.     OVER THE COUNTER MEDICATION Take 1 capsule by mouth daily. Four Function Brain Support     oxyCODONE (OXY IR/ROXICODONE) 5 MG immediate release tablet Take 1-2 tablets (5-10 mg total) by mouth every 6 (six) hours as needed for severe pain or breakthrough pain. 20 tablet 0   Polyethyl Glycol-Propyl Glycol (ULTRA LUBRICANT EYE DROPS OP) Place 1 drop into both eyes 3 (three) times daily as needed (for dry eyes).      PROAIR HFA 108 (90 Base) MCG/ACT inhaler Inhale 2 puffs into the lungs every 6 (six) hours as needed for wheezing or shortness of breath.      Probiotic Product (PROBIOTIC DAILY PO) Take 1 capsule by mouth daily.     sertraline (ZOLOFT) 50 MG tablet Take 50 mg by mouth daily.      solifenacin (VESICARE) 10 MG  tablet Take 10 mg by mouth daily as needed (bladder control).     SUPER B COMPLEX/C PO Take 1 tablet by mouth daily.     traMADol (ULTRAM) 50 MG tablet Take 50 mg by mouth every 6 (six) hours as needed for severe pain.     traZODone (DESYREL) 50 MG tablet Take 50 mg by mouth at bedtime as needed for sleep.     No current facility-administered medications for this visit.    PHYSICAL EXAMINATION: ECOG PERFORMANCE STATUS: 1 - Symptomatic but completely ambulatory  Vitals:   09/12/21 1528  BP: (!) 135/53  Pulse: 67  Resp: 17  Temp: (!) 97 F (36.1 C)  SpO2: 100%   Filed Weights   09/12/21 1528  Weight: 197 lb 6.4 oz (89.5 kg)      LABORATORY  DATA:  I have reviewed the data as listed CMP Latest Ref Rng & Units 09/05/2021 08/29/2021 08/01/2021  Glucose 70 - 99 mg/dL 122(H) 154(H) 120(H)  BUN 8 - 23 mg/dL 11 11 13   Creatinine 0.44 - 1.00 mg/dL 0.70 0.59 0.56  Sodium 135 - 145 mmol/L 134(L) 137 138  Potassium 3.5 - 5.1 mmol/L 4.3 4.3 3.9  Chloride 98 - 111 mmol/L 102 103 103  CO2 22 - 32 mmol/L 23 25 29   Calcium 8.9 - 10.3 mg/dL 8.9 9.5 9.5  Total Protein 6.4 - 8.3 g/dL - - -  Total Bilirubin 0.20 - 1.20 mg/dL - - -  Alkaline Phos 40 - 150 U/L - - -  AST 5 - 34 U/L - - -  ALT 0 - 55 U/L - - -    Lab Results  Component Value Date   WBC 10.2 09/05/2021   HGB 11.3 (L) 09/05/2021   HCT 35.0 (L) 09/05/2021   MCV 90.0 09/05/2021   PLT 276 09/05/2021   NEUTROABS 2.8 10/06/2013    ASSESSMENT & PLAN:  Malignant neoplasm of upper-outer quadrant of left breast in female, estrogen receptor negative ([place]) 2006: 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  Bilateral mastectomies with reconstruction and sentinel lymph node biopsy in the right 2. cardiac ejection fraction 30 to 35%: We will obtain another echocardiogram and make a decision regarding chemotherapy.  Recommendation is to administer Taxol with Herceptin. 3.  Antiestrogen therapy with letrozole started 06/28/2021: She developed nausea issues and therefore she stopped it.  Nausea appears to be improving.  We can consider switching her to anastrozole if necessary.  Return telephone call in January to discuss whether or not she can receive adjuvant chemo.    Orders Placed This Encounter  Procedures   ECHOCARDIOGRAM COMPLETE    Standing Status:   Future    Standing Expiration Date:   09/12/2022    Order Specific Question:   Where should this test be performed    Answer:   [facility]    Order Specific Question:   Perflutren DEFINITY (image enhancing agent) should be administered unless hypersensitivity or allergy exist    Answer:   Administer Perflutren    Order Specific Question:   Reason for exam-Echo    Answer:   Chemo  Z09    Order Specific Question:   Release to patient    Answer:   Immediate   The patient has a good understanding of the overall plan. she agrees with it. she will call with any problems that may develop before the next visit here.  Total time spent: 30 mins including face to face time and time spent for planning, charting and coordination of care  Rulon Eisenmenger, MD, MPH 09/12/2021  I, Thana Ates, am acting as scribe for Dr. Nicholas Willis.  I have reviewed the above documentation for accuracy and completeness, and I agree with the above.

## 2021-09-12 ENCOUNTER — Encounter: Payer: Self-pay | Admitting: *Deleted

## 2021-09-12 ENCOUNTER — Inpatient Hospital Stay: Payer: Medicare Other | Attending: Hematology and Oncology | Admitting: Hematology and Oncology

## 2021-09-12 ENCOUNTER — Other Ambulatory Visit: Payer: Self-pay

## 2021-09-12 DIAGNOSIS — Z170 Estrogen receptor positive status [ER+]: Secondary | ICD-10-CM | POA: Diagnosis not present

## 2021-09-12 DIAGNOSIS — C50411 Malignant neoplasm of upper-outer quadrant of right female breast: Secondary | ICD-10-CM | POA: Diagnosis not present

## 2021-09-12 DIAGNOSIS — C50412 Malignant neoplasm of upper-outer quadrant of left female breast: Secondary | ICD-10-CM

## 2021-09-12 DIAGNOSIS — Z171 Estrogen receptor negative status [ER-]: Secondary | ICD-10-CM | POA: Diagnosis not present

## 2021-09-12 DIAGNOSIS — Z9013 Acquired absence of bilateral breasts and nipples: Secondary | ICD-10-CM | POA: Diagnosis not present

## 2021-09-12 DIAGNOSIS — Z853 Personal history of malignant neoplasm of breast: Secondary | ICD-10-CM | POA: Insufficient documentation

## 2021-09-12 NOTE — Assessment & Plan Note (Signed)
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 status post neoadjuvant chemotherapy with FEC 4 followed by Taxotere 4 completed 07/20/2005 status post lumpectomy 1.2 cm, grade 3, triple negative, Ki-67 31%, status post radiation completed 11/13/2005  2009: 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%, PR 98% status post lumpectomy radiation and 5 years of tamoxifen completed 04/16/2009  Recurrence: 05/23/2021: Screening mammogram detected right breast mass 10 o'clock position 1.2 cm, biopsy with grade 3 IDC ER 20%, PR 0%, HER2 positive, Ki-67 5%  BRCA2 mutation  Treatment plan: 1.Bilateral mastectomies with reconstruction and sentinel lymph node biopsy in the right 2. cardiac ejection fraction 30 to 35%: Therefore Herceptin is not being given at this time. 3.  Antiestrogen therapy with letrozole started 06/28/2021  Letrozole toxicities:

## 2021-09-13 ENCOUNTER — Other Ambulatory Visit: Payer: Self-pay

## 2021-09-13 ENCOUNTER — Ambulatory Visit (INDEPENDENT_AMBULATORY_CARE_PROVIDER_SITE_OTHER): Payer: Medicare Other | Admitting: Plastic Surgery

## 2021-09-13 ENCOUNTER — Encounter: Payer: Medicare Other | Admitting: Plastic Surgery

## 2021-09-13 DIAGNOSIS — D0511 Intraductal carcinoma in situ of right breast: Secondary | ICD-10-CM

## 2021-09-13 MED ORDER — METHOCARBAMOL 500 MG PO TABS: 500.0000 mg | ORAL_TABLET | Freq: Three times a day (TID) | ORAL | 0 refills | Status: DC | PRN

## 2021-09-13 MED ORDER — OXYCODONE HCL 5 MG PO TABS: 5.0000 mg | ORAL_TABLET | ORAL | 0 refills | Status: DC | PRN

## 2021-09-13 NOTE — Progress Notes (Signed)
Patient presents postop from bilateral breast reconstruction with tissue expanders and acellular dermal matrix.  She overall feels like things are going okay.  She is still having a bit of pain.  Drain output has not been extensive from either side.  Negative margins were obtained on her pathology.  It sounds like she may be recommended to have adjuvant chemotherapy depending on her cardiac evaluation.  On exam there is minimal swelling.  Skin overall looks healthy with a few mildly darkened areas along the incision on both sides but overall I think these would be superficial at the worst.  We will plan to leave her drains in for another week and then reevaluate.  We would plan to go very slow with the expansion given her history of radiation and the second incision that had to be made on the right side to ensure appropriate tumor margins.

## 2021-09-17 ENCOUNTER — Encounter: Payer: Self-pay | Admitting: Plastic Surgery

## 2021-09-18 ENCOUNTER — Telehealth: Payer: Self-pay

## 2021-09-18 NOTE — Telephone Encounter (Signed)
Spoke to Jordan Willis. She is still taking the antibiotics. She is aware of f/up date and will call if anything changes between now and 12/8.

## 2021-09-19 ENCOUNTER — Telehealth: Payer: Self-pay | Admitting: Plastic Surgery

## 2021-09-19 NOTE — Telephone Encounter (Signed)
Spoke to pt's dtr, Arnetra; confirmed that pt has one more tablet left of Bactrim to take tonight, nausea experienced yesterday, HA x 2 days, upset stomach, increased pain at surgical site on L breast, Tylenol x pain and fever, she c/o "raw feeling" at drain site.   Dtr took her temp while on the phone and it went down to 99.6 from 103. Adv pt that I called [name] twice and left a vm but hadn't received a call back. Learned later that he was getting ready to complete his last SX of the day.   Per Derek [place], [place] (on site), pt is to go to ER if SX worsen or fever spikes. Fever has been more uncontrolled than controlled and would need to be seen by ER for further evaluation. Adv dtr to call office back and provider on call would assist her, she conveyed understanding. Adv her that if I received a call from [name], I would give her a call back to adv her. She conveyed understanding.   Will forward to [place] per his request.

## 2021-09-19 NOTE — Telephone Encounter (Signed)
Patient messaged on the 11/27 with concern of infection and was called back to confirm that is is still on antibiotics and that she has an upcoming appt with [name] 12/8.  Patient has now had a temperature of 101 and then today,11/29 her temperature is 102 as of 7am. She has been sleeping non stop and constantly dizzy once standing.  Daughter is very concerned about mother.   Please Follow Up.

## 2021-09-20 ENCOUNTER — Ambulatory Visit (INDEPENDENT_AMBULATORY_CARE_PROVIDER_SITE_OTHER): Payer: Medicare Other | Admitting: Physician Assistant

## 2021-09-20 ENCOUNTER — Other Ambulatory Visit: Payer: Self-pay

## 2021-09-20 VITALS — BP 100/64 | HR 82 | Temp 98.9°F

## 2021-09-20 DIAGNOSIS — Z9889 Other specified postprocedural states: Secondary | ICD-10-CM

## 2021-09-20 NOTE — Telephone Encounter (Signed)
Patient to come in for evaluation today

## 2021-09-20 NOTE — Progress Notes (Signed)
Patient is a 70 year old female with PMH of anxiety, HTN, HLD, DM with most recent A1c 7.1, and DCIS right breast s/p bilateral mastectomies with immediate reconstruction performed 09/04/2021 by [name] who presents to clinic for postoperative follow-up.  She was seen most recently here in clinic 09/13/2021.  At that time, exam was largely reassuring.  Plan was to go slowly with expansion given her history of radiation and additional incision right breast to ensure appropriate tumor margins.  She then called the office yesterday, 09/19/2021, with complaints of headache, nausea, fevers, and right-sided breast pain.   Today, she is accompanied by her daughter at bedside.  Vital signs obtained immediately prior to my evaluation are all stable.  Patient is afebrile.  She is not toxic appearing.  She states that her pain symptoms have not worsened and are predominantly located at the JP drain tube insertion sites bilaterally.  She is hoping that they can be removed today.  Denies any swelling or redness.  She endorses headache, lightheadedness, and nausea symptoms, as well.  Physical exam is reassuring.  Breast without any redness, induration, or other cellulitic findings.  No subcutaneous fluid collections appreciated on exam.  JP drains intact and functional bilaterally.  Clearish yellow drainage in bulbs bilaterally.  Reviewed drainage records and she has drained less than 20 cc bilaterally over the course of the past few days.  Speaks in full sentences, no increased work of breathing.  Heart rate well controlled in 70s to 80s.  JP drains removed without complication.  She did get lightheaded after I sat her up in the chair following the procedure.  Suspect degree of orthostatic lightheadedness versus vasovagal from the procedure as she was anxious about the tube removal.  With regard to her fevers at home, explained that there is a broad differential.  While she denies any obvious sick contacts, we are  less than 1 week removed from Thanksgiving holiday and her symptoms could reflect viral illness.  Also cannot rule out UTI or other possible bacterial infection.  While she recently had reconstruction, there is nothing on her exam here today to suggest infection of her expanders.  While she is diabetic and has not recently her sugars, she is not tachycardic or toxic appearing.  She is beta blocked which could be masking tachycardia.  Regardless, given her nausea and lightheadedness, emphasized the importance of getting her blood sugars checked ASAP to rule out hyperglycemia.  Emphasized the importance of her following up with her primary care provider in the next couple of days regarding her symptoms and for ongoing evaluation and work-up.

## 2021-09-26 DIAGNOSIS — E1165 Type 2 diabetes mellitus with hyperglycemia: Secondary | ICD-10-CM | POA: Diagnosis not present

## 2021-09-26 DIAGNOSIS — E059 Thyrotoxicosis, 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: Secondary | ICD-10-CM | POA: Diagnosis not present

## 2021-09-26 DIAGNOSIS — E7800 Pure hypercholesterolemia, unspecified: Secondary | ICD-10-CM | POA: Diagnosis not present

## 2021-09-26 DIAGNOSIS — E118 Type 2 diabetes mellitus with unspecified complications: Secondary | ICD-10-CM | POA: Diagnosis not present

## 2021-09-26 DIAGNOSIS — E042 Nontoxic multinodular goiter: Secondary | ICD-10-CM | POA: Diagnosis not present

## 2021-09-28 ENCOUNTER — Other Ambulatory Visit: Payer: Self-pay

## 2021-09-28 ENCOUNTER — Ambulatory Visit (INDEPENDENT_AMBULATORY_CARE_PROVIDER_SITE_OTHER): Payer: Medicare Other | Admitting: Plastic Surgery

## 2021-09-28 ENCOUNTER — Encounter: Payer: Self-pay | Admitting: Plastic Surgery

## 2021-09-28 DIAGNOSIS — C50912 Malignant neoplasm of unspecified site of left female breast: Secondary | ICD-10-CM | POA: Diagnosis not present

## 2021-09-28 DIAGNOSIS — Z9889 Other specified postprocedural states: Secondary | ICD-10-CM

## 2021-09-28 NOTE — Progress Notes (Signed)
Patient presents about 3 weeks postop from bilateral mastectomies and tissue expander reconstruction.  She had some fevers initially but these been resolved.  Her drains were taken out last week.  She feels like she is making progress.  On exam today there is some area of concern in the bilateral mastectomy flaps.  On the right side inferomedially there looks to be some full-thickness injury to the skin which has declared itself.  On the left side there is a few millimeters of skin on either side of the incision that looks to have some compromise as well.  I had a long discussion with the patient and her daughter but ultimately I think it would be best to go ahead and debride this skin in the operating room.  I may switch out her expanders at the same time depending on what it looks like.  Hopefully this will be able to salvage the reconstruction but they are aware that if there is persistent healing issues both expanders may need to be removed.  We will plan to do this as soon as we can.

## 2021-09-28 NOTE — H&P (View-Only) (Signed)
Patient presents about 3 weeks postop from bilateral mastectomies and tissue expander reconstruction.  She had some fevers initially but these been resolved.  Her drains were taken out last week.  She feels like she is making progress.  On exam today there is some area of concern in the bilateral mastectomy flaps.  On the right side inferomedially there looks to be some full-thickness injury to the skin which has declared itself.  On the left side there is a few millimeters of skin on either side of the incision that looks to have some compromise as well.  I had a long discussion with the patient and her daughter but ultimately I think it would be best to go ahead and debride this skin in the operating room.  I may switch out her expanders at the same time depending on what it looks like.  Hopefully this will be able to salvage the reconstruction but they are aware that if there is persistent healing issues both expanders may need to be removed.  We will plan to do this as soon as we can.

## 2021-10-02 ENCOUNTER — Encounter (HOSPITAL_COMMUNITY): Payer: Self-pay | Admitting: Plastic Surgery

## 2021-10-02 ENCOUNTER — Encounter: Payer: Self-pay | Admitting: *Deleted

## 2021-10-02 NOTE — Progress Notes (Signed)
Anesthesia Chart Review: [name]  Case: [id number] Date/Time: 10/03/21 1345   Procedures:      Debridement bilateral mastectomy flaps (Bilateral: Breast) - 1.5 hour     POSSIBLE REMOVAL OF BILATERAL TISSUE EXPANDERS WITH REPLACEMENT OF TISSUE EXPANDERS (Bilateral: Breast)   Anesthesia type: General   Pre-op diagnosis: S/P Breast Reconstruction   Location: [facility] OR ROOM 19 / [place] [facility]   Surgeons: [name], MD       DISCUSSION: Patient is a 70 year old female scheduled for the above procedure. Jordan Willis is s/p bilateral mastectomies with right sentinel LN biopsy by [name] and bilateral breast reconstruction with tissue expanders by [name] on 09/04/21. At 09/28/21 follow-up, "On the right side inferomedially there looks to be some full-thickness injury to the skin which has declared itself.  On the left side there is a few millimeters of skin on either side of the incision that looks to have some compromise as well." Debridement in the OR recommended, and may switch out expanders at that time.   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    Jordan Willis was diagnosed with non-ischemic cardiomyopathy in April 2021, and EF had recovered to EF 50-55% by June 2022 but down to 30-35% by August 2022 pre-chemo echo. At that time, Jordan Willis was able to walk 30 minutes/day without CV symptoms and denied SOB and peripheral edema. Dr. Geralyn Flash  notes indicate that he has been sending communication to cardiologist regarding EF 30-35% and how this might impact chemotherapy agent choices. (Jordan Willis currently has echo scheduled per [name] on 10/09/21 and is scheduled to see Dr. Terri Skains next on 11/27/21.)  By notes, Jordan Willis was felt to be intermediate risk from a CV standpoint for her mastectomies and reconstruction surgery last month.  Jordan Willis is a same-day work-up.  Anesthesia team to evaluate on the day of surgery.   VS: Ht 5\' 7"  (1.702 m)   Wt 85.3 kg   BMI 29.44 kg/m  BP Readings from Last 3 Encounters:  09/20/21 100/64  09/12/21 (!) 135/53  09/05/21 (!) 142/66   Pulse Readings from Last 3 Encounters:  09/20/21 82  09/12/21 67  09/05/21 75     PROVIDERS: [name], MD is PCP  Rex Kras, DO is cardiologist Jacelyn Pi, MD is endocrinologist Nicholas Lose, MD is [facility] Stark Klein, MD is general surgeon   LABS: Currently, last lab results include: Lab Results  Component Value Date   WBC 10.2 09/05/2021   HGB 11.3 (L) 09/05/2021   HCT 35.0 (L) 09/05/2021   PLT 276 09/05/2021   GLUCOSE 122 (H) 09/05/2021   NA 134 (L) 09/05/2021   K 4.3 09/05/2021   CL 102 09/05/2021   CREATININE 0.70 09/05/2021   BUN 11 09/05/2021   CO2 23 09/05/2021   HGBA1C 7.2 (H) 08/01/2021     EKG: 05/26/21: Normal sinus rhythm, 72 bpm, left axis, LAE, left bundle branch block.      CV: Echo 06/15/21:  1. Septal dyssynergy due to LBBB; severe hypokinesis of the anteroseptal wall  and apex; overall moderate to severe LV dysfunction.  2. Left ventricular ejection fraction, by estimation, is 30 to 35%. The left ventricle has moderate to severely decreased function. The left ventricle demonstrates regional wall motion abnormalities (see scoring diagram/findings for description). The left ventricular internal cavity size was mildly dilated. Left ventricular diastolic parameters are consistent with Grade I diastolic dysfunction (impaired  relaxation). Elevated left atrial pressure. The average left ventricular global longitudinal strain is -16.7 %. The global longitudinal strain is abnormal.  3. Right ventricular systolic function is normal. The right ventricular size is normal. There is normal pulmonary artery systolic pressure.   4. Left atrial size was mildly dilated.   5. The mitral valve is normal in structure. Mild to moderate mitral valve regurgitation. No evidence of mitral stenosis.   6. The aortic valve is tricuspid. Aortic valve regurgitation is not  visualized. Mild aortic valve sclerosis is present, with no evidence of  aortic valve stenosis.   7. The inferior vena cava is normal in size with greater than 50%  respiratory variability, suggesting right atrial pressure of 3 mmHg.     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   Breast cancer, female, left 03/03/2012   Cardiomyopathy ([place])    Carpal tunnel syndrome    Bilateral, Mild   Cerebral atherosclerosis    Chronic sinusitis    DCIS (ductal carcinoma in situ) of breast, right 03/03/2012   Diabetes mellitus without complication (HCC)    metformin   Dyspnea    When patient gets sick uses Pro-Air inhaler   Frequent sinus infections    GERD (gastroesophageal reflux disease)    occ   Goiter    History of cardiomegaly    History of cataract    Bilateral   History of kidney stones    08/28/2018 currently has a large kidney stone, asymptomatic at this time   History of migraine    History of uterine fibroid    History of uterine prolapse    Hyperlipidemia     Hypertension    LBBB (left bundle branch block)    Menopause 03/03/2012   Nasal turbinate hypertrophy 11/29/2016   Left inferior    Personal history of chemotherapy    Personal history of radiation therapy    Pneumonia    as a child   PONV (postoperative nausea and vomiting)    Sinusitis 2014   Sleep apnea    No CPAP   SUI (stress urinary incontinence, female)    SVD (spontaneous vaginal delivery)    x 2   Thyroid nodule     Past Surgical History:  Procedure Laterality Date   ABDOMINAL HYSTERECTOMY     BILATERAL SALPINGOOPHORECTOMY Bilateral    BLADDER SUSPENSION N/A 05/16/2017   Procedure: TRANSVAGINAL TAPE (TVT) PROCEDURE;  Surgeon: Everett Graff, MD;  Location: [facility];  Service: Gynecology;  Laterality: N/A;   BREAST BIOPSY Right 2009   BREAST LUMPECTOMY Right 2009   BREAST LUMPECTOMY Left 2006   BREAST RECONSTRUCTION WITH PLACEMENT OF TISSUE EXPANDER AND FLEX HD (ACELLULAR HYDRATED DERMIS) Bilateral 09/04/2021   Procedure: BILATERAL BREAST  RECONSTRUCTION WITH PLACEMENT OF TISSUE EXPANDER AND FLEX HD (ACELLULAR HYDRATED DERMIS);  Surgeon: [name], MD;  Location: [facility];  Service: Plastics;  Laterality: Bilateral;   CARDIAC CATHETERIZATION     CATARACT EXTRACTION Left 10/23/2017   CATARACT EXTRACTION Right 11/06/2017   COLONOSCOPY  10/22/2009   Normal.  Repeat 5 years.  [name]   CYSTOSCOPY N/A 05/16/2017   Procedure: CYSTOSCOPY;  Surgeon: Everett Graff, MD;  Location: [facility];  Service: Gynecology;  Laterality: N/A;   EYE SURGERY     bilateral cataracts   LEFT HEART CATH AND CORONARY ANGIOGRAPHY N/A 02/09/2020   Procedure: LEFT HEART CATH AND CORONARY ANGIOGRAPHY;  Surgeon: Nigel Mormon, MD;  Location: [facility];  Service: Cardiovascular;  Laterality: N/A;   MASTECTOMY W/ SENTINEL NODE BIOPSY Right 09/04/2021   Procedure: BILATERAL MASTECTOMIES WITH RIGHT SENTINEL LYMPH NODE BIOPSY;  Surgeon: Stark Klein, MD;  Location: [facility];  Service:  General;  Laterality: Right;   ROBOTIC ASSISTED TOTAL HYSTERECTOMY N/A 05/16/2017   Procedure: ROBOTIC ASSISTED TOTAL HYSTERECTOMY;  Surgeon: Delsa Bern, MD;  Location: [facility];  Service: Gynecology;  Laterality: N/A;   ROTATOR CUFF REPAIR Bilateral    SHOULDER ARTHROSCOPY WITH ROTATOR CUFF REPAIR AND SUBACROMIAL DECOMPRESSION Right 09/03/2018   Procedure: Right shoulder manipulation under anesthesia, exam under anesthesia, mini open rotator cuff repair, subacromial decompression;  Surgeon: Susa Day, MD;  Location: [facility];  Service: Orthopedics;  Laterality: Right;  90 mins   TUBAL LIGATION     WISDOM TOOTH EXTRACTION      MEDICATIONS: No current facility-administered medications for this encounter.    acetaminophen (TYLENOL) 500 MG tablet   Ascorbic Acid (VITAMIN C PO)   aspirin 81 MG EC tablet   atorvastatin (LIPITOR) 20 MG tablet   cetirizine (ZYRTEC) 10 MG tablet   Cholecalciferol (VITAMIN D3) 25 MCG (1000 UT) CAPS   ENTRESTO 49-51 MG   fluticasone (FLONASE) 50 MCG/ACT nasal spray   gabapentin (NEURONTIN) 100 MG capsule   metFORMIN (GLUCOPHAGE-XR) 500 MG 24 hr tablet   methocarbamol (ROBAXIN) 500 MG tablet   metoprolol succinate (TOPROL-XL) 25 MG 24 hr tablet   Multiple Vitamin (MULTIVITAMIN WITH MINERALS) TABS tablet   multivitamin-lutein (OCUVITE-LUTEIN) CAPS capsule   nitroGLYCERIN (NITROSTAT) 0.4 MG SL tablet   Olopatadine HCl 0.2 % SOLN   ondansetron (ZOFRAN) 4 MG tablet   oxyCODONE (OXY IR/ROXICODONE) 5 MG immediate release tablet   Polyethyl Glycol-Propyl Glycol (ULTRA LUBRICANT EYE DROPS OP)   PROAIR HFA 108 (90 Base) MCG/ACT inhaler   Probiotic Product (PROBIOTIC DAILY PO)   sertraline (ZOLOFT) 50 MG tablet   solifenacin (VESICARE) 10 MG tablet   SUPER B COMPLEX/C PO   traZODone (DESYREL) 50 MG tablet   Biotin w/ Vitamins C & E (HAIR/SKIN/NAILS PO)   GLUCOSAMINE-CHONDROITIN PO   glucose blood (ONETOUCH VERIO) test strip   OneTouch Delica Lancets 14E MISC    OVER THE COUNTER MEDICATION   traMADol (ULTRAM) 50 MG tablet    Myra Gianotti, PA-C Surgical Short Stay/Anesthesiology [facility] Phone [phone] [facility] Phone [phone] 10/02/2021 2:05 PM

## 2021-10-02 NOTE — Anesthesia Preprocedure Evaluation (Addendum)
Anesthesia Evaluation  Patient identified by MRN, date of birth, ID band Patient awake    Reviewed: Allergy & Precautions, H&P , NPO status , Patient's Chart, lab work & pertinent test results, reviewed documented beta blocker date and time   History of Anesthesia Complications (+) PONV  Airway Mallampati: II  TM Distance: >3 FB Neck ROM: Full    Dental no notable dental hx. (+) Teeth Intact, Dental Advisory Given   Pulmonary sleep apnea ,    Pulmonary exam normal breath sounds clear to auscultation       Cardiovascular hypertension, Pt. on medications and Pt. on home beta blockers + dysrhythmias  Rhythm:Regular Rate:Normal     Neuro/Psych  Headaches, Anxiety    GI/Hepatic Neg liver ROS, GERD  ,  Endo/Other  diabetes, Type 2, Oral Hypoglycemic Agents  Renal/GU negative Renal ROS  negative genitourinary   Musculoskeletal  (+) Arthritis , Osteoarthritis,    Abdominal   Peds  Hematology  (+) Blood dyscrasia, anemia ,   Anesthesia Other Findings   Reproductive/Obstetrics negative OB ROS                           Anesthesia Physical Anesthesia Plan  ASA: 3  Anesthesia Plan: General   Post-op Pain Management: Tylenol PO (pre-op)   Induction: Intravenous  PONV Risk Score and Plan: 4 or greater and Ondansetron, Dexamethasone and Scopolamine patch - Pre-op  Airway Management Planned: Oral ETT  Additional Equipment:   Intra-op Plan:   Post-operative Plan: Extubation in OR  Informed Consent: I have reviewed the patients History and Physical, chart, labs and discussed the procedure including the risks, benefits and alternatives for the proposed anesthesia with the patient or authorized representative who has indicated his/her understanding and acceptance.     Dental advisory given  Plan Discussed with: CRNA  Anesthesia Plan Comments: (PAT note written 10/02/2021 by Myra Gianotti,  PA-C. )      Anesthesia Quick Evaluation

## 2021-10-02 NOTE — Progress Notes (Signed)
DUE TO COVID-19 ONLY ONE VISITOR IS ALLOWED TO COME WITH YOU AND STAY IN THE WAITING ROOM ONLY DURING PRE OP AND PROCEDURE DAY OF SURGERY.   PCP - Dr [name] Cardiologist - Rex Kras, DO Endocrinology - Dr Jacelyn Pi  Chest x-ray - n/a EKG - 05/26/21 Stress Test - 02/01/20 ECHO - 09/12/21 Cardiac Cath - 02/09/20  ICD Pacemaker/Loop - n/a  Sleep Study -  Yes CPAP - No CPAP  Do not take metformin on the morning of surgery.  If your blood sugar is less than 70 mg/dL, you will need to treat for low blood sugar: Treat a low blood sugar (less than 70 mg/dL) with  cup of clear juice (cranberry or apple), 4 glucose tablets, OR glucose gel. Recheck blood sugar in 15 minutes after treatment (to make sure it is greater than 70 mg/dL). If your blood sugar is not greater than 70 mg/dL on recheck, call [phone] for further instructions.  Aspirin Instructions: Follow your surgeon's instructions on when to stop aspirin prior to surgery,  If no instructions were given by your surgeon then you will need to call the office for those instructions.  ERAS: Clear liquids til 11 AM DOS  Anesthesia review: Yes  STOP now taking any Aspirin (unless otherwise instructed by your surgeon), Aleve, Naproxen, Ibuprofen, Motrin, Advil, Goody's, BC's, all herbal medications, fish oil, and all vitamins.   Coronavirus Screening Covid test n/a Ambulatory Surgery  Do you have any of the following symptoms:  Cough yes/no: No Fever (>100.73F)  yes/no: No Runny nose yes/no: No Sore throat yes/no: No Difficulty breathing/shortness of breath  yes/no: No  Have you traveled in the last 14 days and where? yes/no: No  Patient verbalized understanding of instructions that were given via phone.

## 2021-10-03 ENCOUNTER — Encounter (HOSPITAL_COMMUNITY)
Admission: RE | Disposition: A | Discharge status: Home / Self Care | Payer: Self-pay | Source: Home / Self Care | Attending: Plastic Surgery

## 2021-10-03 ENCOUNTER — Ambulatory Visit (HOSPITAL_COMMUNITY)
Admission: RE | Admit: 2021-10-03 | Discharge: 2021-10-03 | Disposition: A | Discharge status: Home / Self Care | Payer: Medicare Other | Attending: Plastic Surgery | Admitting: Plastic Surgery

## 2021-10-03 ENCOUNTER — Ambulatory Visit (HOSPITAL_COMMUNITY): Payer: Medicare Other | Admitting: Vascular Surgery

## 2021-10-03 ENCOUNTER — Encounter (HOSPITAL_COMMUNITY): Payer: Self-pay | Admitting: Plastic Surgery

## 2021-10-03 ENCOUNTER — Other Ambulatory Visit: Payer: Self-pay

## 2021-10-03 DIAGNOSIS — Z9013 Acquired absence of bilateral breasts and nipples: Secondary | ICD-10-CM | POA: Insufficient documentation

## 2021-10-03 DIAGNOSIS — Z45812 Encounter for adjustment or removal of left breast implant: Secondary | ICD-10-CM | POA: Diagnosis not present

## 2021-10-03 DIAGNOSIS — Z853 Personal history of malignant neoplasm of breast: Secondary | ICD-10-CM | POA: Insufficient documentation

## 2021-10-03 DIAGNOSIS — Z7984 Long term (current) use of oral hypoglycemic drugs: Secondary | ICD-10-CM | POA: Diagnosis not present

## 2021-10-03 DIAGNOSIS — Z79899 Other long term (current) drug therapy: Secondary | ICD-10-CM | POA: Insufficient documentation

## 2021-10-03 DIAGNOSIS — E119 Type 2 diabetes mellitus without complications: Secondary | ICD-10-CM | POA: Diagnosis not present

## 2021-10-03 DIAGNOSIS — L905 Scar conditions and fibrosis of skin: Secondary | ICD-10-CM | POA: Insufficient documentation

## 2021-10-03 DIAGNOSIS — L7682 Other postprocedural complications of skin and subcutaneous tissue: Secondary | ICD-10-CM | POA: Insufficient documentation

## 2021-10-03 DIAGNOSIS — I96 Gangrene, not elsewhere classified: Secondary | ICD-10-CM | POA: Insufficient documentation

## 2021-10-03 DIAGNOSIS — I10 Essential (primary) hypertension: Secondary | ICD-10-CM | POA: Diagnosis not present

## 2021-10-03 DIAGNOSIS — T8549XA Other mechanical complication of breast prosthesis and implant, initial encounter: Secondary | ICD-10-CM | POA: Diagnosis not present

## 2021-10-03 DIAGNOSIS — Y939 Activity, unspecified: Secondary | ICD-10-CM | POA: Diagnosis not present

## 2021-10-03 DIAGNOSIS — N641 Fat necrosis of breast: Secondary | ICD-10-CM | POA: Insufficient documentation

## 2021-10-03 DIAGNOSIS — Z9889 Other specified postprocedural states: Secondary | ICD-10-CM | POA: Diagnosis not present

## 2021-10-03 DIAGNOSIS — X58XXXA Exposure to other specified factors, initial encounter: Secondary | ICD-10-CM | POA: Insufficient documentation

## 2021-10-03 DIAGNOSIS — Z87442 Personal history of urinary calculi: Secondary | ICD-10-CM | POA: Diagnosis not present

## 2021-10-03 DIAGNOSIS — Z45811 Encounter for adjustment or removal of right breast implant: Secondary | ICD-10-CM | POA: Diagnosis not present

## 2021-10-03 HISTORY — PX: DEBRIDEMENT AND CLOSURE WOUND: SHX5614

## 2021-10-03 HISTORY — PX: REMOVAL OF BILATERAL TISSUE EXPANDERS WITH PLACEMENT OF BILATERAL BREAST IMPLANTS: SHX6431

## 2021-10-03 LAB — GLUCOSE, CAPILLARY
Glucose-Capillary: 92 mg/dL (ref 70–99)
Glucose-Capillary: 78 mg/dL (ref 70–99)
Glucose-Capillary: 136 mg/dL — ABNORMAL HIGH (ref 70–99)
Glucose-Capillary: 66 mg/dL — ABNORMAL LOW (ref 70–99)
Glucose-Capillary: 87 mg/dL (ref 70–99)

## 2021-10-03 SURGERY — DEBRIDEMENT, WOUND, WITH CLOSURE
Anesthesia: General | Site: Breast | Laterality: Bilateral

## 2021-10-03 MED ORDER — AMISULPRIDE (ANTIEMETIC) 5 MG/2ML IV SOLN
INTRAVENOUS | Status: AC
  Filled 2021-10-03: qty 2

## 2021-10-03 MED ORDER — ONDANSETRON HCL 4 MG/2ML IJ SOLN
INTRAMUSCULAR | Status: DC | PRN
  Administered 2021-10-03: 4 mg via INTRAVENOUS

## 2021-10-03 MED ORDER — BUPIVACAINE-EPINEPHRINE (PF) 0.25% -1:200000 IJ SOLN
INTRAMUSCULAR | Status: AC
  Filled 2021-10-03: qty 30

## 2021-10-03 MED ORDER — FENTANYL CITRATE (PF) 250 MCG/5ML IJ SOLN
INTRAMUSCULAR | Status: AC
  Filled 2021-10-03: qty 5

## 2021-10-03 MED ORDER — DEXAMETHASONE SODIUM PHOSPHATE 10 MG/ML IJ SOLN
INTRAMUSCULAR | Status: DC | PRN
  Administered 2021-10-03: 8 mg via INTRAVENOUS

## 2021-10-03 MED ORDER — FENTANYL CITRATE (PF) 250 MCG/5ML IJ SOLN
INTRAMUSCULAR | Status: DC | PRN
  Administered 2021-10-03: 50 ug via INTRAVENOUS
  Administered 2021-10-03 (×2): 100 ug via INTRAVENOUS

## 2021-10-03 MED ORDER — CHLORHEXIDINE GLUCONATE CLOTH 2 % EX PADS: 6.0000 | MEDICATED_PAD | Freq: Once | CUTANEOUS | Status: DC

## 2021-10-03 MED ORDER — SCOPOLAMINE 1 MG/3DAYS TD PT72
1.0000 | MEDICATED_PATCH | TRANSDERMAL | Status: DC
  Administered 2021-10-03: 1.5 mg via TRANSDERMAL
  Filled 2021-10-03: qty 1

## 2021-10-03 MED ORDER — LIDOCAINE 2% (20 MG/ML) 5 ML SYRINGE
INTRAMUSCULAR | Status: DC | PRN
  Administered 2021-10-03: 60 mg via INTRAVENOUS

## 2021-10-03 MED ORDER — CEFAZOLIN SODIUM-DEXTROSE 2-4 GM/100ML-% IV SOLN
INTRAVENOUS | Status: AC
  Filled 2021-10-03: qty 100

## 2021-10-03 MED ORDER — AMISULPRIDE (ANTIEMETIC) 5 MG/2ML IV SOLN
5.0000 mg | Freq: Once | INTRAVENOUS | Status: AC
  Administered 2021-10-03: 5 mg via INTRAVENOUS

## 2021-10-03 MED ORDER — BUPIVACAINE-EPINEPHRINE (PF) 0.25% -1:200000 IJ SOLN: INTRAMUSCULAR | Status: DC | PRN

## 2021-10-03 MED ORDER — ONDANSETRON HCL 4 MG PO TABS: 4.0000 mg | ORAL_TABLET | Freq: Three times a day (TID) | ORAL | 0 refills | Status: DC | PRN

## 2021-10-03 MED ORDER — DEXTROSE 50 % IV SOLN
12.5000 g | INTRAVENOUS | Status: AC
  Administered 2021-10-03: 12.5 g via INTRAVENOUS
  Filled 2021-10-03: qty 50

## 2021-10-03 MED ORDER — CEFAZOLIN SODIUM-DEXTROSE 2-4 GM/100ML-% IV SOLN
2.0000 g | INTRAVENOUS | Status: AC
  Administered 2021-10-03: 2 g via INTRAVENOUS

## 2021-10-03 MED ORDER — ONDANSETRON HCL 4 MG/2ML IJ SOLN
INTRAMUSCULAR | Status: AC
  Filled 2021-10-03: qty 2

## 2021-10-03 MED ORDER — DEXTROSE 50 % IV SOLN
INTRAVENOUS | Status: AC
  Filled 2021-10-03: qty 50

## 2021-10-03 MED ORDER — ACETAMINOPHEN 500 MG PO TABS: 1000.0000 mg | ORAL_TABLET | Freq: Once | ORAL | Status: DC

## 2021-10-03 MED ORDER — HYDROMORPHONE HCL 1 MG/ML IJ SOLN
0.2500 mg | INTRAMUSCULAR | Status: DC | PRN
  Administered 2021-10-03 (×2): 0.5 mg via INTRAVENOUS

## 2021-10-03 MED ORDER — ROCURONIUM BROMIDE 10 MG/ML (PF) SYRINGE
PREFILLED_SYRINGE | INTRAVENOUS | Status: AC
  Filled 2021-10-03: qty 10

## 2021-10-03 MED ORDER — CHLORHEXIDINE GLUCONATE 0.12 % MT SOLN
OROMUCOSAL | Status: AC
  Administered 2021-10-03: 15 mL
  Filled 2021-10-03: qty 15

## 2021-10-03 MED ORDER — PHENYLEPHRINE HCL-NACL 20-0.9 MG/250ML-% IV SOLN
INTRAVENOUS | Status: DC | PRN
Start: 2021-10-03 — End: 2021-10-03
  Administered 2021-10-03: 25 ug/min via INTRAVENOUS

## 2021-10-03 MED ORDER — HYDROMORPHONE HCL 1 MG/ML IJ SOLN
INTRAMUSCULAR | Status: AC
  Filled 2021-10-03: qty 1

## 2021-10-03 MED ORDER — SULFAMETHOXAZOLE-TRIMETHOPRIM 800-160 MG PO TABS: 1.0000 | ORAL_TABLET | Freq: Two times a day (BID) | ORAL | 0 refills | Status: DC

## 2021-10-03 MED ORDER — DEXAMETHASONE SODIUM PHOSPHATE 10 MG/ML IJ SOLN
INTRAMUSCULAR | Status: AC
  Filled 2021-10-03: qty 1

## 2021-10-03 MED ORDER — PROPOFOL 10 MG/ML IV BOLUS
INTRAVENOUS | Status: DC | PRN
  Administered 2021-10-03: 80 mg via INTRAVENOUS

## 2021-10-03 MED ORDER — 0.9 % SODIUM CHLORIDE (POUR BTL) OPTIME
TOPICAL | Status: DC | PRN
  Administered 2021-10-03: 1000 mL

## 2021-10-03 MED ORDER — SODIUM CHLORIDE 0.9 % IV SOLN: INTRAVENOUS | Status: DC | PRN

## 2021-10-03 MED ORDER — ROCURONIUM BROMIDE 10 MG/ML (PF) SYRINGE
PREFILLED_SYRINGE | INTRAVENOUS | Status: DC | PRN
  Administered 2021-10-03: 60 mg via INTRAVENOUS

## 2021-10-03 MED ORDER — KETOROLAC TROMETHAMINE 30 MG/ML IJ SOLN
INTRAMUSCULAR | Status: AC
  Filled 2021-10-03: qty 1

## 2021-10-03 MED ORDER — LACTATED RINGERS IV SOLN: INTRAVENOUS | Status: DC

## 2021-10-03 SURGICAL SUPPLY — 64 items
APL PRP STRL LF DISP 70% ISPRP (MISCELLANEOUS)
APL SKNCLS STERI-STRIP NONHPOA (GAUZE/BANDAGES/DRESSINGS) ×2
BAG COUNTER SPONGE SURGICOUNT (BAG) ×2 IMPLANT
BAG SPNG CNTER NS LX DISP (BAG) ×1
BAG SURGICOUNT SPONGE COUNTING (BAG) ×1
BENZOIN TINCTURE PRP APPL 2/3 (GAUZE/BANDAGES/DRESSINGS) ×4 IMPLANT
BIOPATCH WHT 1IN DISK W/4.0 H (GAUZE/BANDAGES/DRESSINGS) ×4 IMPLANT
BLADE SURG 15 STRL LF DISP TIS (BLADE) ×1 IMPLANT
BLADE SURG 15 STRL SS (BLADE) ×3
BNDG CMPR MED 15X6 ELC VLCR LF (GAUZE/BANDAGES/DRESSINGS) ×2
BNDG ELASTIC 6X15 VLCR STRL LF (GAUZE/BANDAGES/DRESSINGS) ×4 IMPLANT
CANISTER SUCT 1200ML W/VALVE (MISCELLANEOUS) ×3 IMPLANT
CANISTER WOUND CARE 500ML ATS (WOUND CARE) IMPLANT
CHLORAPREP W/TINT 26 (MISCELLANEOUS) IMPLANT
CLOSURE WOUND 1/2 X4 (GAUZE/BANDAGES/DRESSINGS) ×2
COVER SURGICAL LIGHT HANDLE (MISCELLANEOUS) ×3 IMPLANT
DRAIN CHANNEL 15F RND FF W/TCR (WOUND CARE) ×2 IMPLANT
DRAPE INCISE IOBAN 66X45 STRL (DRAPES) IMPLANT
DRAPE LAPAROTOMY T 98X78 PEDS (DRAPES) IMPLANT
DRSG EMULSION OIL 3X3 NADH (GAUZE/BANDAGES/DRESSINGS) IMPLANT
DRSG MEPILEX BORDER 4X8 (GAUZE/BANDAGES/DRESSINGS) ×3 IMPLANT
DRSG PAD ABDOMINAL 8X10 ST (GAUZE/BANDAGES/DRESSINGS) ×4 IMPLANT
DRSG TELFA 3X8 NADH (GAUZE/BANDAGES/DRESSINGS) IMPLANT
DRSG VAC ATS LRG SENSATRAC (GAUZE/BANDAGES/DRESSINGS) IMPLANT
DRSG VAC ATS MED SENSATRAC (GAUZE/BANDAGES/DRESSINGS) IMPLANT
ELECT CAUTERY BLADE 6.4 (BLADE) ×3 IMPLANT
ELECT REM PT RETURN 9FT ADLT (ELECTROSURGICAL) ×3
ELECTRODE REM PT RTRN 9FT ADLT (ELECTROSURGICAL) ×1 IMPLANT
EVACUATOR SILICONE 100CC (DRAIN) ×4 IMPLANT
GAUZE SPONGE 4X4 12PLY STRL (GAUZE/BANDAGES/DRESSINGS) ×5 IMPLANT
GLOVE SRG 8 PF TXTR STRL LF DI (GLOVE) ×1 IMPLANT
GLOVE SURG ENC TEXT LTX SZ7.5 (GLOVE) ×12 IMPLANT
GLOVE SURG UNDER POLY LF SZ8 (GLOVE) ×3
GOWN STRL REUS W/ TWL LRG LVL3 (GOWN DISPOSABLE) ×2 IMPLANT
GOWN STRL REUS W/TWL LRG LVL3 (GOWN DISPOSABLE) ×6
HANDPIECE INTERPULSE COAX TIP (DISPOSABLE) ×3
IMPL EXPANDER BREAST 375CC (Breast) IMPLANT
IMPLANT BREAST 375CC (Breast) ×4 IMPLANT
IMPLANT EXPANDER BREAST 375CC (Breast) ×2 IMPLANT
IV NS IRRIG 3000ML ARTHROMATIC (IV SOLUTION) ×2 IMPLANT
JET LAVAGE IRRISEPT WOUND (IRRIGATION / IRRIGATOR) ×3
KIT BASIN OR (CUSTOM PROCEDURE TRAY) ×3 IMPLANT
KIT TURNOVER KIT B (KITS) ×3 IMPLANT
LAVAGE JET IRRISEPT WOUND (IRRIGATION / IRRIGATOR) IMPLANT
NDL 21 GA WING INFUSION (NEEDLE) IMPLANT
NDL HYPO 25GX1X1/2 BEV (NEEDLE) ×1 IMPLANT
NEEDLE 21 GA WING INFUSION (NEEDLE) ×6 IMPLANT
NEEDLE HYPO 25GX1X1/2 BEV (NEEDLE) ×3 IMPLANT
NS IRRIG 1000ML POUR BTL (IV SOLUTION) ×3 IMPLANT
PACK GENERAL/GYN (CUSTOM PROCEDURE TRAY) ×3 IMPLANT
PAD DRESSING TELFA 3X8 NADH (GAUZE/BANDAGES/DRESSINGS) IMPLANT
PENCIL SMOKE EVACUATOR (MISCELLANEOUS) ×3 IMPLANT
SET HNDPC FAN SPRY TIP SCT (DISPOSABLE) IMPLANT
SOLUTION BETADINE 4OZ (MISCELLANEOUS) ×3 IMPLANT
STRIP CLOSURE SKIN 1/2X4 (GAUZE/BANDAGES/DRESSINGS) ×2 IMPLANT
SUT ETHILON 3 0 FSL (SUTURE) ×4 IMPLANT
SUT MNCRL AB 4-0 PS2 18 (SUTURE) ×2 IMPLANT
SUT PDS AB 3-0 SH 27 (SUTURE) ×23 IMPLANT
SWAB COLLECTION DEVICE MRSA (MISCELLANEOUS) IMPLANT
SYR 10ML LL (SYRINGE) ×3 IMPLANT
SYR 50ML LL SCALE MARK (SYRINGE) ×2 IMPLANT
SYR CONTROL 10ML LL (SYRINGE) ×3 IMPLANT
TOWEL GREEN STERILE (TOWEL DISPOSABLE) ×3 IMPLANT
TOWEL GREEN STERILE FF (TOWEL DISPOSABLE) ×3 IMPLANT

## 2021-10-03 NOTE — Op Note (Signed)
Operative Note   DATE OF OPERATION: 10/03/2021  SURGICAL DEPARTMENT: Plastic Surgery  PREOPERATIVE DIAGNOSES:  Bilateral mastectomy flap necrosis  POSTOPERATIVE DIAGNOSES:  same  PROCEDURE:  1. Excision bilateral mastectomy flap necrosis totaling 10x3cm on the right and 12x2cm on the left 2. Removal bilateral breast tissue expanders 3. Replacement of bilateral breast tissue expanders  SURGEON: Talmadge Coventry, MD  ASSISTANT: Verdie Shire, PA and Lennice Sites, MD The above assisted throughout the case.  They were essential in retraction and counter traction when needed to make the case progress smoothly.  This retraction and assistance made it possible to see the tissue planes for the procedure.  The assistance was needed for hemostasis, tissue re-approximation and closure of the incision site.   ANESTHESIA:  General.   COMPLICATIONS: None.   INDICATIONS FOR PROCEDURE:  The patient, Jordan Willis is a 70 y.o. female born on [birth year], is here for treatment of bilateral mastectomy flap necrosis MRN: [id number]  CONSENT:  Informed consent was obtained directly from the patient. Risks, benefits and alternatives were fully discussed. Specific risks including but not limited to bleeding, infection, hematoma, seroma, scarring, pain, contracture, asymmetry, wound healing problems, and need for further surgery were all discussed. The patient did have an ample opportunity to have questions answered to satisfaction.   DESCRIPTION OF PROCEDURE:  The patient was taken to the operating room. SCDs were placed and antibiotics were given.  General anesthesia was administered.  The patient's operative site was prepped and draped in a sterile fashion. A time out was performed and all information was confirmed to be correct.  Started by examining the skin flaps.  She had poor healing along several aspects of her incisions on both sides.  There was some obvious necrosis on the right and left sides  along the incision lines.  I marked out with a marking pen around this area and excised it on both sides with a 10 blade.  It was sent to pathology from the right and left sides.  The acellular dermal matrix did not incorporate well.  Seropurulent fluid was encountered on both sides and was cultured and suctioned.  The old expanders and acellular dermal matrix were removed and there entirety.  Hemostasis was obtained.  Both pockets were irrigated with Irrisept solution followed by 1.5 L of pulse lavage on each side.  15 French drains were placed on each side and secured with nylon sutures.  I did elect to try and salvage the reconstruction by replacing new expanders.  The expanders were then brought onto the field.  These were [employer] Arturo smooth round high-profile expanders with 375 cc prescribed fill volume.  Serial number on the right side [id number].  Serial number on the left side [id number].  All the air was removed from both expanders.  I then placed 3-0 PDS stay sutures in the chest wall on each side at 3, 6, and 9:00.  Both pockets were then irrigated with triple antibiotic solution.  I then changed my gloves and threaded the PDS through the stay sutures in the expanders were placed into the wound.  Stay sutures were tied down to secure the position.  Approximately 150 cc of air was placed into both expanders.  Wound was then closed in layers with interrupted buried 3-0 PDS and a 3-0 PDS running suture for the skin.  Soft dressings were then applied.  The patient tolerated the procedure well.  There were no complications. The patient was allowed to wake from anesthesia, extubated  and taken to the recovery room in satisfactory condition.

## 2021-10-03 NOTE — Interval H&P Note (Signed)
Patient seen and examined. Risks and benefits discussed. Proceed with surgery.

## 2021-10-03 NOTE — Discharge Instructions (Addendum)
Activity As tolerated: NO showers for 3 days. Keep ACE wrap on breasts until then. After showering, put ACE wrap back on, this is important for compression. NO driving while in pain, taking pain medication or if you are unable to safely react to traffic. No heavy activities Take Pain medication (oxycodone) that was prescribed at your pre-op appointment with [name] as needed for severe pain. You can also take 500mg  of tylenol every 6-8 hours as needed for additional pain control.   An antibiotic was sent to your pharmacy, take this until your drains are removed.  Diet: Regular. Drink plenty of fluids and eat healthy, high protein, low carbs.  Wound Care: Keep dressing clean & dry. You may change bandages after showering if you continue to notice some drainage.  Monitor drain output daily, record this on a sheet of paper and bring to your 1 week follow up.  Special Instructions: Call Doctor if any unusual problems occur such as pain, excessive Bleeding, unrelieved Nausea/vomiting, Fever &/or chills  Follow-up appointment: Scheduled for next week.

## 2021-10-03 NOTE — Anesthesia Procedure Notes (Signed)
Procedure Name: Intubation Date/Time: 10/03/2021 2:30 PM Performed by: Minerva Ends, CRNA Pre-anesthesia Checklist: Patient identified, Emergency Drugs available, Suction available and Patient being monitored Patient Re-evaluated:Patient Re-evaluated prior to induction Oxygen Delivery Method: Circle system utilized Preoxygenation: Pre-oxygenation with 100% oxygen Induction Type: IV induction Ventilation: Mask ventilation without difficulty Laryngoscope Size: Mac and 3 Grade View: Grade III Tube type: Oral Tube size: 7.0 mm Number of attempts: 1 Placement Confirmation: ETT inserted through vocal cords under direct vision, positive ETCO2 and breath sounds checked- equal and bilateral Secured at: 22 cm Tube secured with: Tape Dental Injury: Teeth and Oropharynx as per pre-operative assessment

## 2021-10-03 NOTE — Anesthesia Postprocedure Evaluation (Signed)
Anesthesia Post Note  Patient: Jordan Willis  Procedure(s) Performed: Debridement bilateral mastectomy flaps (Bilateral: Breast) REMOVAL OF BILATERAL TISSUE EXPANDERS WITH REPLACEMENT OF TISSUE EXPANDERS (Bilateral: Breast)     Patient location during evaluation: PACU Anesthesia Type: General Level of consciousness: awake and alert Pain management: pain level controlled Vital Signs Assessment: post-procedure vital signs reviewed and stable Respiratory status: spontaneous breathing, nonlabored ventilation and respiratory function stable Cardiovascular status: blood pressure returned to baseline and stable Postop Assessment: no apparent nausea or vomiting Anesthetic complications: no   No notable events documented.  Last Vitals:  Vitals:   10/03/21 1649 10/03/21 1719  BP: 136/74 137/70  Pulse: 71 75  Resp: 19 17  Temp:    SpO2: 99% 100%    Last Pain:  Vitals:   10/03/21 1634  TempSrc:   PainSc: 8                  Hadley Soileau,W. EDMOND

## 2021-10-03 NOTE — Transfer of Care (Signed)
Immediate Anesthesia Transfer of Care Note  Patient: Jordan Willis  Procedure(s) Performed: Debridement bilateral mastectomy flaps (Bilateral: Breast) REMOVAL OF BILATERAL TISSUE EXPANDERS WITH REPLACEMENT OF TISSUE EXPANDERS (Bilateral: Breast)  Patient Location: PACU  Anesthesia Type:General  Level of Consciousness: awake, alert  and oriented  Airway & Oxygen Therapy: Patient Spontanous Breathing  Post-op Assessment: Report given to RN and Post -op Vital signs reviewed and stable  Post vital signs: Reviewed and stable  Last Vitals:  Vitals Value Taken Time  BP    Temp    Pulse 85 10/03/21 1618  Resp 19 10/03/21 1618  SpO2 97 % 10/03/21 1618  Vitals shown include unvalidated device data.  Last Pain:  Vitals:   10/03/21 1207  TempSrc:   PainSc: 6          Complications: No notable events documented.

## 2021-10-04 ENCOUNTER — Encounter (HOSPITAL_COMMUNITY): Payer: Self-pay | Admitting: Plastic Surgery

## 2021-10-04 ENCOUNTER — Emergency Department (HOSPITAL_COMMUNITY)
Admission: EM | Admit: 2021-10-04 | Discharge: 2021-10-04 | Disposition: A | Discharge status: Home / Self Care | Payer: Medicare Other | Attending: Emergency Medicine | Admitting: Emergency Medicine

## 2021-10-04 ENCOUNTER — Encounter: Payer: Self-pay | Admitting: Plastic Surgery

## 2021-10-04 ENCOUNTER — Telehealth: Payer: Self-pay | Admitting: Surgical

## 2021-10-04 DIAGNOSIS — Z5321 Procedure and treatment not carried out due to patient leaving prior to being seen by health care provider: Secondary | ICD-10-CM | POA: Insufficient documentation

## 2021-10-04 NOTE — Telephone Encounter (Signed)
Patient is a 70 year old female who underwent surgery yesterday, she reports that this morning after taking her dose of antibiotic she is now having severe itching, fatigue, burning eyes, headache and difficulty breathing.  She also mentioned some swelling of her lips and some burning in her throat.  I did speak with her directly on the phone and her daughter who is also with her.  She had taken a dose of Benadryl, did not feel as if this helped much.  I recommended taking an additional dose of Benadryl.   Given her symptoms I discussed with her that this is concerning and is likely an allergic reaction.  I discussed with her and her daughter that she needs to be evaluate in the emergency room today.  I discussed with her that there is concern for her difficulty breathing worsening and her needing acute medical intervention.  Patient and daughter were understanding of this and understand that my recommendation is to be seen in the emergency room today, as soon as possible.  I recommend calling the ambulance.

## 2021-10-04 NOTE — ED Notes (Signed)
Pt and pt family left. Told registration she did not have the pain anymore and was going home. Pt told registration should would return if pain returned.

## 2021-10-05 ENCOUNTER — Telehealth: Payer: Self-pay

## 2021-10-05 LAB — SURGICAL PATHOLOGY

## 2021-10-05 MED ORDER — DOXYCYCLINE HYCLATE 100 MG PO TABS: 100.0000 mg | ORAL_TABLET | Freq: Two times a day (BID) | ORAL | 0 refills | Status: DC

## 2021-10-05 NOTE — Telephone Encounter (Signed)
Patient called to let us know that the antibiotic we prescribed for her is causing her to itch, sneeze, have headaches, and red eyes.  She said she took benadryl and the symptoms went away, but they came back with her next dose of the antibiotic.  She would like to know if we can prescribe her another antibiotic.  Please call.  *Patient's preferred pharmacy is [facility] on [address] in [place].

## 2021-10-05 NOTE — Telephone Encounter (Signed)
Pt is aware of Matt's instructions and new ABX and conveyed understanding.

## 2021-10-06 ENCOUNTER — Other Ambulatory Visit: Payer: Self-pay | Admitting: Surgical

## 2021-10-06 MED ORDER — CIPROFLOXACIN HCL 500 MG PO TABS: 500.0000 mg | ORAL_TABLET | Freq: Two times a day (BID) | ORAL | 0 refills | Status: AC

## 2021-10-06 NOTE — Addendum Note (Signed)
Addended byRoetta Sessions on: 10/06/2021 11:55 AM   Modules accepted: Orders

## 2021-10-06 NOTE — Progress Notes (Signed)
Med change to Cipro based on intra-op cultures. Will call patient to discuss and d/c doxycycline.

## 2021-10-08 LAB — AEROBIC/ANAEROBIC CULTURE W GRAM STAIN (SURGICAL/DEEP WOUND): Culture: NO GROWTH

## 2021-10-09 ENCOUNTER — Other Ambulatory Visit: Payer: Self-pay

## 2021-10-09 ENCOUNTER — Ambulatory Visit (HOSPITAL_COMMUNITY)
Admission: RE | Admit: 2021-10-09 | Discharge: 2021-10-09 | Disposition: A | Discharge status: Home / Self Care | Payer: Medicare Other | Source: Ambulatory Visit | Attending: Hematology and Oncology | Admitting: Hematology and Oncology

## 2021-10-09 DIAGNOSIS — I517 Cardiomegaly: Secondary | ICD-10-CM | POA: Insufficient documentation

## 2021-10-09 DIAGNOSIS — I10 Essential (primary) hypertension: Secondary | ICD-10-CM | POA: Diagnosis not present

## 2021-10-09 DIAGNOSIS — E119 Type 2 diabetes mellitus without complications: Secondary | ICD-10-CM | POA: Insufficient documentation

## 2021-10-09 DIAGNOSIS — C50412 Malignant neoplasm of upper-outer quadrant of left female breast: Secondary | ICD-10-CM | POA: Diagnosis not present

## 2021-10-09 DIAGNOSIS — I447 Left bundle-branch block, unspecified: Secondary | ICD-10-CM | POA: Insufficient documentation

## 2021-10-09 DIAGNOSIS — G4730 Sleep apnea, unspecified: Secondary | ICD-10-CM | POA: Diagnosis not present

## 2021-10-09 DIAGNOSIS — I429 Cardiomyopathy, unspecified: Secondary | ICD-10-CM | POA: Diagnosis not present

## 2021-10-09 DIAGNOSIS — Z171 Estrogen receptor negative status [ER-]: Secondary | ICD-10-CM | POA: Insufficient documentation

## 2021-10-09 DIAGNOSIS — Z01818 Encounter for other preprocedural examination: Secondary | ICD-10-CM | POA: Insufficient documentation

## 2021-10-09 NOTE — Progress Notes (Signed)
°  Echocardiogram 2D Echocardiogram has been performed.  Fidel Levy 10/09/2021, 10:30 AM

## 2021-10-09 NOTE — H&P (Signed)
Reason for Consult/CC: Mastectomy flap necrosis  Jordan Willis is an 70 y.o. female.  HPI: Patient presents with bilateral mastectomy flap necrosis.  Here for debridement.  Allergies:  Allergies  Allergen Reactions   Bactrim [Sulfamethoxazole-Trimethoprim] Shortness Of Breath    Shortness of breath, difficulty breathing, headache, rash, fatigue   Aspirin Palpitations and Other (See Comments)    Heart fluttering, pt takes 40.5 mg (half of 81 mg) daily   Tape Rash    Medications: No current facility-administered medications for this encounter.  Current Outpatient Medications:    acetaminophen (TYLENOL) 500 MG tablet, Take 1,000 mg by mouth every 6 (six) hours as needed for moderate pain., Disp: , Rfl:    Ascorbic Acid (VITAMIN C PO), Take by mouth in the morning., Disp: , Rfl:    aspirin 81 MG EC tablet, Take 40.5 mg by mouth in the morning. Swallow whole., Disp: , Rfl:    atorvastatin (LIPITOR) 20 MG tablet, TAKE 1 TABLET BY MOUTH EVERYDAY AT BEDTIME, Disp: 90 tablet, Rfl: 1   cetirizine (ZYRTEC) 10 MG tablet, Take 10 mg by mouth daily., Disp: , Rfl:    Cholecalciferol (VITAMIN D3) 25 MCG (1000 UT) CAPS, Take 1,000 Units by mouth in the morning., Disp: , Rfl:    ENTRESTO 49-51 MG, TAKE 1 TABLET BY MOUTH TWICE A DAY, Disp: 180 tablet, Rfl: 0   fluticasone (FLONASE) 50 MCG/ACT nasal spray, Place 2 sprays into both nostrils daily., Disp: , Rfl:    gabapentin (NEURONTIN) 100 MG capsule, Take 1 capsule (100 mg total) by mouth 2 (two) times daily. (Patient taking differently: Take 100 mg by mouth 2 (two) times daily as needed (pain).), Disp: 30 capsule, Rfl: 1   GLUCOSAMINE-CHONDROITIN PO, Take 1 tablet by mouth every evening. , Disp: , Rfl:    metFORMIN (GLUCOPHAGE-XR) 500 MG 24 hr tablet, Take 500 mg by mouth 2 (two) times daily., Disp: , Rfl:    methocarbamol (ROBAXIN) 500 MG tablet, Take 1 tablet (500 mg total) by mouth every 6 (six) hours as needed for muscle spasms., Disp: 20 tablet, Rfl:  1   metoprolol succinate (TOPROL-XL) 25 MG 24 hr tablet, Take 25 mg by mouth daily., Disp: , Rfl:    Multiple Vitamin (MULTIVITAMIN WITH MINERALS) TABS tablet, Take 1 tablet by mouth daily. Centrum Silver for Women 50+, Disp: , Rfl:    multivitamin-lutein (OCUVITE-LUTEIN) CAPS capsule, Take 1 capsule by mouth every evening., Disp: , Rfl:    Olopatadine HCl 0.2 % SOLN, Place 1 drop into both eyes daily., Disp: , Rfl:    ondansetron (ZOFRAN) 4 MG tablet, Take 1 tablet (4 mg total) by mouth every 8 (eight) hours as needed for nausea or vomiting., Disp: 20 tablet, Rfl: 0   ondansetron (ZOFRAN) 4 MG tablet, Take 1 tablet (4 mg total) by mouth every 8 (eight) hours as needed for nausea or vomiting., Disp: 20 tablet, Rfl: 0   OVER THE COUNTER MEDICATION, Take 1 capsule by mouth daily. Four Function Brain Support, Disp: , Rfl:    oxyCODONE (OXY IR/ROXICODONE) 5 MG immediate release tablet, Take 1 tablet (5 mg total) by mouth every 4 (four) hours as needed for severe pain., Disp: 20 tablet, Rfl: 0   Polyethyl Glycol-Propyl Glycol (ULTRA LUBRICANT EYE DROPS OP), Place 1 drop into both eyes 3 (three) times daily as needed (for dry eyes). , Disp: , Rfl:    Probiotic Product (PROBIOTIC DAILY PO), Take 1 capsule by mouth in the morning., Disp: , Rfl:  sertraline (ZOLOFT) 50 MG tablet, Take 50 mg by mouth in the morning., Disp: , Rfl:    solifenacin (VESICARE) 10 MG tablet, Take 10 mg by mouth in the morning., Disp: , Rfl:    SUPER B COMPLEX/C PO, Take 1 tablet by mouth in the morning., Disp: , Rfl:    traMADol (ULTRAM) 50 MG tablet, Take 50 mg by mouth every 6 (six) hours as needed for severe pain., Disp: , Rfl:    traZODone (DESYREL) 50 MG tablet, Take 50 mg by mouth at bedtime as needed for sleep., Disp: , Rfl:    Biotin w/ Vitamins C & E (HAIR/SKIN/NAILS PO), Take 1 tablet by mouth daily., Disp: , Rfl:    ciprofloxacin (CIPRO) 500 MG tablet, Take 1 tablet (500 mg total) by mouth 2 (two) times daily for 14  days., Disp: 28 tablet, Rfl: 0   glucose blood (ONETOUCH VERIO) test strip, TEST ONCE DAILY AS DIRECTED 90, Disp: , Rfl:    nitroGLYCERIN (NITROSTAT) 0.4 MG SL tablet, Place 0.4 mg under the tongue every 5 (five) minutes as needed for chest pain., Disp: , Rfl:    OneTouch Delica Lancets 54M MISC, Apply topically., Disp: , Rfl:    PROAIR HFA 108 (90 Base) MCG/ACT inhaler, Inhale 2 puffs into the lungs every 6 (six) hours as needed for wheezing or shortness of breath. , Disp: , Rfl:   Past Medical History:  Diagnosis Date   Anemia    Anginal pain (HCC)    Anxiety    Arthritis    Lumbar spine DDD.   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   Diabetes mellitus without complication (HCC)    metformin   Dyspnea    When patient gets sick uses Pro-Air inhaler   Frequent sinus infections    GERD (gastroesophageal reflux disease)    occ   Goiter    History of cardiomegaly    History of cataract    Bilateral   History of kidney stones    08/28/2018 currently has a large kidney stone, asymptomatic at this time   History of migraine    History of uterine fibroid    History of uterine prolapse    Hyperlipidemia    Hypertension    LBBB (left bundle branch block)    Menopause 03/03/2012   Nasal turbinate hypertrophy 11/29/2016   Left inferior    Personal history of chemotherapy    Personal history of radiation therapy    Pneumonia    as a child   PONV (postoperative nausea and vomiting)    Sinusitis 2014   Sleep apnea    No CPAP   SUI (stress urinary incontinence, female)    SVD (spontaneous vaginal delivery)    x 2   Thyroid nodule     Past Surgical History:  Procedure Laterality Date   ABDOMINAL HYSTERECTOMY     BILATERAL SALPINGOOPHORECTOMY Bilateral    BLADDER SUSPENSION N/A 05/16/2017    Procedure: TRANSVAGINAL TAPE (TVT) PROCEDURE;  Surgeon: Everett Graff, MD;  Location: [facility];  Service: Gynecology;  Laterality: N/A;   BREAST BIOPSY Right 2009   BREAST LUMPECTOMY Right 2009   BREAST LUMPECTOMY Left 2006   BREAST RECONSTRUCTION WITH PLACEMENT OF TISSUE EXPANDER AND FLEX HD (ACELLULAR HYDRATED DERMIS) Bilateral 09/04/2021   Procedure:  BILATERAL BREAST RECONSTRUCTION WITH PLACEMENT OF TISSUE EXPANDER AND FLEX HD (ACELLULAR HYDRATED DERMIS);  Surgeon: [name], MD;  Location: [facility];  Service: Plastics;  Laterality: Bilateral;   CARDIAC CATHETERIZATION     CATARACT EXTRACTION Left 10/23/2017   CATARACT EXTRACTION Right 11/06/2017   COLONOSCOPY  10/22/2009   Normal.  Repeat 5 years.  [name]   CYSTOSCOPY N/A 05/16/2017   Procedure: CYSTOSCOPY;  Surgeon: Everett Graff, MD;  Location: [facility];  Service: Gynecology;  Laterality: N/A;   DEBRIDEMENT AND CLOSURE WOUND Bilateral 10/03/2021   Procedure: Debridement bilateral mastectomy flaps;  Surgeon: [name], MD;  Location: [facility];  Service: Plastics;  Laterality: Bilateral;  1.5 hour   EYE SURGERY     bilateral cataracts   LEFT HEART CATH AND CORONARY ANGIOGRAPHY N/A 02/09/2020   Procedure: LEFT HEART CATH AND CORONARY ANGIOGRAPHY;  Surgeon: Nigel Mormon, MD;  Location: [facility];  Service: Cardiovascular;  Laterality: N/A;   MASTECTOMY W/ SENTINEL NODE BIOPSY Right 09/04/2021   Procedure: BILATERAL MASTECTOMIES WITH RIGHT SENTINEL LYMPH NODE BIOPSY;  Surgeon: Stark Klein, MD;  Location: [facility];  Service: General;  Laterality: Right;   REMOVAL OF BILATERAL TISSUE EXPANDERS WITH PLACEMENT OF BILATERAL BREAST IMPLANTS Bilateral 10/03/2021   Procedure: REMOVAL OF BILATERAL TISSUE EXPANDERS WITH REPLACEMENT OF TISSUE EXPANDERS;  Surgeon: [name], MD;  Location: [facility];  Service: Plastics;  Laterality: Bilateral;   ROBOTIC ASSISTED TOTAL HYSTERECTOMY N/A 05/16/2017   Procedure: ROBOTIC ASSISTED  TOTAL HYSTERECTOMY;  Surgeon: Delsa Bern, MD;  Location: [facility];  Service: Gynecology;  Laterality: N/A;   ROTATOR CUFF REPAIR Bilateral    SHOULDER ARTHROSCOPY WITH ROTATOR CUFF REPAIR AND SUBACROMIAL DECOMPRESSION Right 09/03/2018   Procedure: Right shoulder manipulation under anesthesia, exam under anesthesia, mini open rotator cuff repair, subacromial decompression;  Surgeon: Susa Day, MD;  Location: [facility];  Service: Orthopedics;  Laterality: Right;  90 mins   TUBAL LIGATION     WISDOM TOOTH EXTRACTION      Family History  Problem Relation Age of Onset   Cancer Mother 36       breast cancer   Hypertension Mother    Diabetes Father    Cancer Sister        breast cancer   Breast cancer Sister    Cancer Sister        breast cancer   Breast cancer Sister     Social History:  reports that she has never smoked. She has never used smokeless tobacco. She reports that she does not drink alcohol and does not use drugs.  Physical Exam Blood pressure 137/70, pulse 75, temperature 98.1 F (36.7 C), resp. rate 17, height 5' 7.5" (1.715 m), weight 85.2 kg, SpO2 100 %. General: No acute distress Cardiovascular: Regular rhythm Pulmonary: Unlabored  No results found for this or any previous visit (from the past 41 hour(s)).  No results found.  Assessment/Plan: Here for mastectomy flap debridement.  Planning exchange of tissue expanders.  We reviewed the risks and benefits and she is interested in moving forward.  [name] 10/09/2021, 1:27 PM

## 2021-10-11 ENCOUNTER — Ambulatory Visit (INDEPENDENT_AMBULATORY_CARE_PROVIDER_SITE_OTHER): Payer: Medicare Other | Admitting: Plastic Surgery

## 2021-10-11 ENCOUNTER — Other Ambulatory Visit: Payer: Self-pay

## 2021-10-11 DIAGNOSIS — Z9889 Other specified postprocedural states: Secondary | ICD-10-CM

## 2021-10-11 NOTE — Progress Notes (Signed)
Patient presents postop from excision bilateral mastectomy flap necrosis and washout of both pockets and placement of new tissue expanders.  She has Enterobacter growing out of her cultures that is sensitive to Cipro which she is now taking.  She feels generally good.  She is worried about the cloudiness of the drainage coming out from around her left expander.  On exam her incisions look fine and no signs of skin compromise.  There is some cloudiness to the JP drain from the left and the right looks unremarkable.  We will plan to leave the drains in another week.  I did explain that it is concerning to have positive cultures around expanders.  Hopefully with the washout and the placement of new ones that this will be controlled.  She is understanding that if the process is unable to come under control we will have to remove the expanders.  We will plan to see her next week.  All questions were answered.

## 2021-10-13 LAB — ECHOCARDIOGRAM COMPLETE
Area-P 1/2: 3.63 cm2
Calc EF: 48.8 %
S' Lateral: 2.7 cm
Single Plane A2C EF: 48.1 %
Single Plane A4C EF: 47.8 %

## 2021-10-18 ENCOUNTER — Other Ambulatory Visit: Payer: Self-pay

## 2021-10-18 ENCOUNTER — Ambulatory Visit (INDEPENDENT_AMBULATORY_CARE_PROVIDER_SITE_OTHER): Payer: Medicare Other | Admitting: Surgical

## 2021-10-18 DIAGNOSIS — D0511 Intraductal carcinoma in situ of right breast: Secondary | ICD-10-CM

## 2021-10-18 DIAGNOSIS — Z9889 Other specified postprocedural states: Secondary | ICD-10-CM

## 2021-10-18 MED ORDER — CIPROFLOXACIN HCL 500 MG PO TABS: 500.0000 mg | ORAL_TABLET | Freq: Two times a day (BID) | ORAL | 0 refills | Status: AC

## 2021-10-18 NOTE — Progress Notes (Signed)
70 year old female presents for follow-up after excision of bilateral mastectomy flap necrosis and washout of both breast pockets with placement of new tissue expanders.  She had Enterobacter growing out of her cultures that was sensitive to Cipro.  Patient presents today with her daughter and son.  She reports that overall she is doing well.  She has some concerns about the left breast Steri-Strip folding inward.  She is not having any infectious symptoms.  She is continuing to take her Cipro.  She reports she has a few days left of this.  Reports JP drain output has been approximately 15 to 20 cc per 24 hours, sometimes up to 30.  Chaperone present on exam On exam Steri-Strips were removed, incisions are healing fine.  No signs of skin necrosis or skin compromise.  There continues to be some slight cloudiness of the JP drains, however this is significantly improved.  There is no erythema or cellulitic changes of either breast.   We will plan to leave the drains in for 1 additional week, additional prescription for Cipro sent to pharmacy.  Recommend following up in 1 week for reevaluation.

## 2021-10-23 NOTE — Progress Notes (Signed)
HEMATOLOGY-ONCOLOGY TELEPHONE VISIT PROGRESS NOTE  I connected with Jordan Willis on 10/24/2021 at  9:00 AM EST by telephone and verified that I am speaking with the correct person using two identifiers.  I discussed the limitations, risks, security and privacy concerns of performing an evaluation and management service by telephone and the availability of in person appointments.  I also discussed with the patient that there may be a patient responsible charge related to this service. The patient expressed understanding and agreed to proceed.   History of Present Illness: Jordan Willis is a 71 y.o. female with above-mentioned history of right breast cancer having undergone bilateral mastectomies. She presents via telephone today for follow-up.  She has had problems with breast reconstruction and developed an infection which is being treated aggressively.  She thinks of the drains will come out tomorrow.  Oncology History  Malignant neoplasm of upper-outer quadrant of left breast in female, estrogen receptor negative ([name])  02/02/2005 Initial Diagnosis   Left breast needle core biopsy showed invasive mammary cancer   02/11/2005 Breast MRI   Left breast upper outer quadrant: 4.4 x 2.6 x 3.7 cm mass and a smaller adjacent mass measuring 1 x 0.8 x 0.4 cm   03/02/2005 - 07/20/2005 Neo-Adjuvant Chemotherapy   AC4 followed by Taxotere 4   08/03/2005 Surgery   Left breast lumpectomy: 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%, PR 0%, HER-2 negative, Ki-67 31%   09/26/2005 - 11/13/2005 Radiation Therapy   Adjuvant radiation therapy   03/18/2008 Initial Biopsy   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   03/22/2008 Surgery   Right breast lumpectomy 0.9 cm DCIS ER 90% PR 98% stage 0   04/11/2008 Procedure   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   05/10/2008 - 07/02/2008 Radiation Therapy   Adjuvant  radiation therapy   07/17/2008 - 04/16/2009 Anti-estrogen oral therapy   Femara then switched to tamoxifen which was discontinued in 2010 due to concern about uterine cancer   05/23/2021 Relapse/Recurrence   Screening detected right breast mass at 10 o'clock position measuring 1.2 cm, ultrasound-guided biopsy revealed grade 3 IDC ER 20%, PR 0%, HER2 positive 3+, Ki-67 50%   05/31/2021 Cancer Staging   Staging form: Breast, AJCC 7th Edition - Clinical: Stage IA (rT1c, N0, M0) - Signed by Nicholas Lose, MD on 05/31/2021 Stage prefix: Recurrence Laterality: Right Biopsy of metastatic site performed: No Estrogen receptor status: Positive Progesterone receptor status: Negative HER2 status: Positive    09/04/2021 Surgery   Bilateral mastectomies Left mastectomy: Benign Right mastectomy: Grade 3 IDC with DCIS 3.6 cm, 0/6 lymph nodes negative, ER 20%, PR 0%, HER2 positive by IHC, Ki-67 50%   09/12/2021 Cancer Staging   Staging form: Breast, AJCC 7th Edition - Pathologic: Stage IIA (T2, N0, cM0) - Signed by Nicholas Lose, MD on 09/12/2021 Estrogen receptor status: Positive Progesterone receptor status: Positive HER2 status: Negative      Assessment Plan:  Malignant neoplasm of upper-outer quadrant of left breast in female, estrogen receptor negative ([place]) 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, 0/6 lymph nodes negative, ER 20%, PR 0%, HER2 positive by IHC, Ki-67 50%  (2006: 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  We also discussed the final staging along with previously performed ER/PR testing.  Treatment plan: Even though she would benefit from adjuvant chemotherapy, because of her previous low ejection fraction of 30 to 35% we were unable to initiate anti-HER2 therapy.   Current echocardiogram 10/09/2021: EF 45 to 50% Therefore we will start her on subcutaneous Herceptin every 3 weeks starting February.  Breast reconstruction: Flap necrosis Enterobacter infection, being managed by plastic surgery. Once infection is improved we can start her on Herceptin subcutaneous injections.    I discussed the assessment and treatment plan with the patient. The patient was provided an opportunity to ask questions and all were answered. The patient agreed with the plan and demonstrated an understanding of the instructions. The patient was advised to call back or seek an in-person evaluation if the symptoms worsen or if the condition fails to improve as anticipated.   Total time spent: 15 mins including non-face to face time and time spent for planning, charting and coordination of care  Rulon Eisenmenger, MD 10/24/2021    I, Thana Ates, am acting as scribe for Nicholas Lose, MD.  I have reviewed the above documentation for accuracy and completeness, and I agree with the above.

## 2021-10-24 ENCOUNTER — Encounter: Payer: Self-pay | Admitting: *Deleted

## 2021-10-24 ENCOUNTER — Inpatient Hospital Stay: Payer: Medicare Other | Attending: Hematology and Oncology | Admitting: Hematology and Oncology

## 2021-10-24 DIAGNOSIS — C50412 Malignant neoplasm of upper-outer quadrant of left female breast: Secondary | ICD-10-CM

## 2021-10-24 DIAGNOSIS — Z171 Estrogen receptor negative status [ER-]: Secondary | ICD-10-CM

## 2021-10-24 DIAGNOSIS — C50411 Malignant neoplasm of upper-outer quadrant of right female breast: Secondary | ICD-10-CM | POA: Insufficient documentation

## 2021-10-24 DIAGNOSIS — Z79899 Other long term (current) drug therapy: Secondary | ICD-10-CM | POA: Insufficient documentation

## 2021-10-24 DIAGNOSIS — Z5112 Encounter for antineoplastic immunotherapy: Secondary | ICD-10-CM | POA: Insufficient documentation

## 2021-10-24 NOTE — Assessment & Plan Note (Signed)
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 I discussed the margins.  We also discussed the final staging along with previously performed ER/PR testing.  Treatment plan: Even though she would benefit from adjuvant chemotherapy, because of her previous low ejection fraction of 30 to 35% we were unable to initiate anti-HER2 therapy.   Current echocardiogram 10/09/2021: EF 45 to 50%  Breast reconstruction: Flap necrosis Enterobacter infection, being managed by plastic surgery.

## 2021-10-24 NOTE — Progress Notes (Signed)
Patient is a 71 year old female s/p excision of bilateral mastectomy flap necrosis and washout of breast pockets with placement of new tissue expanders who presents to clinic for postoperative follow-up.  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  In the OR, 150 cc of air was placed into her new 375 cc expanders bilaterally.  The air will need to be extracted via syringe at time of first outpatient expander fill.  She was last seen here in office for follow-up on 10/18/2021.  At that time, she complained that her Steri-Strip was folding inward.  She was continuing to take her ciprofloxacin, as directed.  JP drain output was fluctuating between 15 to 30 cc and there was persistent mild cloudiness in each of the drains.  No erythema or cellulitic changes were noted on exam.  Steri-Strips were removed, but plan was for continued drain placement x1 additional week.  Her ciprofloxacin was also refilled.  Today, she is accompanied by her son at bedside.  She states she has been doing well.  She still continues to endorse pain in each breast, particularly in the left axillary region.  She states that this is improved compared to last encounter and continues to improve on a daily basis.  She manages her symptoms with Tylenol and ibuprofen.  Her drainage has been consistently below 25 cc for the past few days and each side and she feels prepared for drain removal.  Physical exam is reassuring.  No obvious areas of necrosis.  No subcutaneous fluid collections.  JP drains intact and functional, normal-appearing drainage in bulbs bilaterally.  No areas of redness concerning for infection.  Drains were removed without complication or difficulty.  We will continue to hold off on first expander fill, but reassess in 10-14 days.  She will call should she develop any questions or concerns.

## 2021-10-24 NOTE — Progress Notes (Signed)
START OFF PATHWAY REGIMEN - Breast   [id number]:Trastuzumab and hyaluronidase-oysk 600 mg/10,000 units SUBQ D1 q21 Days:   A cycle is every 21 days:     Trastuzumab and hyaluronidase-oysk   **Always confirm dose/schedule in your pharmacy ordering system**  Patient Characteristics: Postoperative without Neoadjuvant Therapy (Pathologic Staging), Invasive Disease, Adjuvant Therapy, HER2 Positive, ER Positive, Node Negative, pT1c, pN0/N10m Therapeutic Status: Postoperative without Neoadjuvant Therapy (Pathologic Staging) AJCC Grade: G2 AJCC N Category: pN0 AJCC M Category: cM0 ER Status: Positive (+) AJCC 8 Stage Grouping: IA HER2 Status: Positive (+) Oncotype Dx Recurrence Score: Not Appropriate AJCC T Category: pT1c PR Status: Negative (-) Adjuvant Therapy Status: No Adjuvant Therapy Received Yet or Changing Initial Adjuvant Regimen due to Tolerance Intent of Therapy: Curative Intent, Discussed with Patient

## 2021-10-25 ENCOUNTER — Telehealth: Payer: Self-pay | Admitting: Hematology and Oncology

## 2021-10-25 ENCOUNTER — Ambulatory Visit (INDEPENDENT_AMBULATORY_CARE_PROVIDER_SITE_OTHER): Payer: Medicaid Other | Admitting: Physician Assistant

## 2021-10-25 ENCOUNTER — Other Ambulatory Visit: Payer: Self-pay

## 2021-10-25 DIAGNOSIS — Z9889 Other specified postprocedural states: Secondary | ICD-10-CM

## 2021-10-25 NOTE — Telephone Encounter (Signed)
Scheduled appointment per 01/03 los. Patient aware.

## 2021-10-26 ENCOUNTER — Encounter: Payer: Self-pay | Admitting: *Deleted

## 2021-10-30 ENCOUNTER — Other Ambulatory Visit: Payer: Self-pay | Admitting: Hematology and Oncology

## 2021-11-08 DIAGNOSIS — J0101 Acute recurrent maxillary sinusitis: Secondary | ICD-10-CM | POA: Diagnosis not present

## 2021-11-08 DIAGNOSIS — H9201 Otalgia, right ear: Secondary | ICD-10-CM | POA: Diagnosis not present

## 2021-11-09 ENCOUNTER — Other Ambulatory Visit: Payer: Self-pay

## 2021-11-09 ENCOUNTER — Ambulatory Visit (INDEPENDENT_AMBULATORY_CARE_PROVIDER_SITE_OTHER): Payer: Commercial Managed Care - HMO | Admitting: Plastic Surgery

## 2021-11-09 ENCOUNTER — Encounter: Payer: Self-pay | Admitting: Hematology and Oncology

## 2021-11-09 DIAGNOSIS — Z9889 Other specified postprocedural states: Secondary | ICD-10-CM

## 2021-11-09 NOTE — Progress Notes (Signed)
Patient presents in follow-up after breast reconstruction.  She initially had some wound healing complications and an infection around both expanders.  The expanders were removed and the pockets were irrigated and new expanders were placed with drains.  Her drains were removed and things have been going reasonably well.  Unfortunately over the past week or so she has noticed some increasing swelling on the right side and some drainage from the incision in that area.  The left side seems to be doing well.  She has recently been diagnosed with a sinus infection was given prednisone treatment along with some antibiotics.  On exam she has a pinpoint hole in her incision where there is a small amount of drainage coming through.  I was able to aspirate 60 cc of a seroma in the right breast with a butterfly needle over her expander port.  This was serous appearing fluid.  There is no erythema or warmth on either breast.  I did explain to the patient and her daughter that further wound healing problems might result in failure of the reconstruction.  For now we will plan to keep a compressive wrap and avoid strenuous activity and see how she does over the next week.  I did explain that the prednisone treatment for her sinuses may negatively impact the healing process of her breasts.  She is going to consider stopping it.  We will plan to see her next week or sooner if necessary.

## 2021-11-13 ENCOUNTER — Other Ambulatory Visit: Payer: Self-pay | Admitting: Cardiology

## 2021-11-13 DIAGNOSIS — E782 Mixed hyperlipidemia: Secondary | ICD-10-CM

## 2021-11-14 ENCOUNTER — Encounter: Payer: Self-pay | Admitting: Hematology and Oncology

## 2021-11-15 ENCOUNTER — Other Ambulatory Visit: Payer: Self-pay

## 2021-11-15 ENCOUNTER — Telehealth: Payer: Self-pay | Admitting: Hematology and Oncology

## 2021-11-15 ENCOUNTER — Ambulatory Visit (INDEPENDENT_AMBULATORY_CARE_PROVIDER_SITE_OTHER): Payer: Medicare Other | Admitting: Plastic Surgery

## 2021-11-15 DIAGNOSIS — Z9889 Other specified postprocedural states: Secondary | ICD-10-CM

## 2021-11-15 NOTE — Progress Notes (Signed)
Patient presents for further evaluation about her breast reconstruction.  Last week I aspirated a seroma from the right side.  She does not feel like this has recurred and has not had any other issues with the right side.  She does feel like the left side is more swollen this week.  On exam her skin is holding together and there is no wound healing problem at the moment..  She does feel like she has a seroma on the left side.  I did explain to her previously that persistent problems with healing or fluid collections could compromise the ultimate outcome of the reconstruction.  However, I feel like we should move ahead to see if this can be expanded.  I placed 50 cc on the right side with no issues.  On the left side I also infiltrated 50 cc and aspirated 60 cc of seroma fluid on the way out.  She tolerated this fine.  I will plan to see her next week.

## 2021-11-15 NOTE — Telephone Encounter (Signed)
Oncology telephone note Patient's insurance has denied the injection version of Herceptin and would only approve the IV treatment for Herceptin.  Unfortunately, patient is unable to receive IVs given her frail veins.  She is an extremely hard stick for IVs.  In addition she is also not receiving any IV chemotherapy, therefore a subcutaneous injection would have been an excellent treatment option for her.  We will request her insurance to reevaluate the decision so that she can receive Herceptin.

## 2021-11-20 NOTE — Progress Notes (Signed)
Patient Care Team: [name], MD as PCP - General (Internal Medicine) Delsa Bern, MD as Attending Physician (Obstetrics and Gynecology) Posey Boyer, MD (Inactive) as Consulting Physician (Family Medicine) Rockwell Germany, RN as Oncology Nurse Navigator Mauro Kaufmann, RN as Oncology Nurse Navigator  DIAGNOSIS:    ICD-10-CM   1. Malignant neoplasm of upper-outer quadrant of left breast in female, estrogen receptor negative ([place])  C50.412    Z17.1       SUMMARY OF ONCOLOGIC HISTORY: Oncology History  Malignant neoplasm of upper-outer quadrant of left breast in female, estrogen receptor negative ([place])  02/02/2005 Initial Diagnosis   Left breast needle core biopsy showed invasive mammary cancer   02/11/2005 Breast MRI   Left breast upper outer quadrant: 4.4 x 2.6 x 3.7 cm mass and a smaller adjacent mass measuring 1 x 0.8 x 0.4 cm   03/02/2005 - 07/20/2005 Neo-Adjuvant Chemotherapy   AC4 followed by Taxotere 4   08/03/2005 Surgery   Left breast lumpectomy: 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%, PR 0%, HER-2 negative, Ki-67 31%   09/26/2005 - 11/13/2005 Radiation Therapy   Adjuvant radiation therapy   03/18/2008 Initial Biopsy   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   03/22/2008 Surgery   Right breast lumpectomy 0.9 cm DCIS ER 90% PR 98% stage 0   04/11/2008 Procedure   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   05/10/2008 - 07/02/2008 Radiation Therapy   Adjuvant radiation therapy   07/17/2008 - 04/16/2009 Anti-estrogen oral therapy   Femara then switched to tamoxifen which was discontinued in 2010 due to concern about uterine cancer   05/23/2021 Relapse/Recurrence   Screening detected right breast mass at 10 o'clock position measuring 1.2 cm, ultrasound-guided biopsy revealed grade 3 IDC ER 20%, PR 0%, HER2 positive 3+, Ki-67 50%   05/31/2021 Cancer Staging    Staging form: Breast, AJCC 7th Edition - Clinical: Stage IA (rT1c, N0, M0) - Signed by Nicholas Lose, MD on 05/31/2021 Stage prefix: Recurrence Laterality: Right Biopsy of metastatic site performed: No Estrogen receptor status: Positive Progesterone receptor status: Negative HER2 status: Positive    09/04/2021 Surgery   Bilateral mastectomies Left mastectomy: Benign Right mastectomy: Grade 3 IDC with DCIS 3.6 cm, 0/6 lymph nodes negative, ER 20%, PR 0%, HER2 positive by IHC, Ki-67 50%   09/12/2021 Cancer Staging   Staging form: Breast, AJCC 7th Edition - Pathologic: Stage IIA (T2, N0, cM0) - Signed by Nicholas Lose, MD on 09/12/2021 Estrogen receptor status: Positive Progesterone receptor status: Positive HER2 status: Negative    Breast cancer of upper-outer quadrant of right female breast ([place])  09/04/2021 Initial Diagnosis   Breast cancer of upper-outer quadrant of right female breast ([place])   11/21/2021 -  Chemotherapy   Patient is on Treatment Plan : BREAST Trastuzumab q21d       CHIEF COMPLIANT: Cycle 1 Trastzumab  INTERVAL HISTORY: Jordan Willis is a 71 y.o. with above-mentioned history of right breast cancer having undergone bilateral mastectomies, currently on chemotherapy with Trastzumab. She presents to the clinic today for follow-up.  She follows with cardiology for her borderline ejection fraction.  ALLERGIES:  is allergic to bactrim [sulfamethoxazole-trimethoprim], aspirin, and tape.  MEDICATIONS:  Current Outpatient Medications  Medication Sig Dispense Refill   acetaminophen (TYLENOL) 500 MG tablet Take 1,000 mg by mouth every 6 (six) hours as needed for moderate pain.  Ascorbic Acid (VITAMIN C PO) Take by mouth in the morning.     aspirin 81 MG EC tablet Take 40.5 mg by mouth in the morning. Swallow whole.     atorvastatin (LIPITOR) 20 MG tablet TAKE 1 TABLET BY MOUTH EVERYDAY AT BEDTIME 90 tablet 1   azithromycin (ZITHROMAX) 250 MG tablet      Biotin  w/ Vitamins C & E (HAIR/SKIN/NAILS PO) Take 1 tablet by mouth daily.     cetirizine (ZYRTEC) 10 MG tablet Take 10 mg by mouth daily.     Cholecalciferol (VITAMIN D3) 25 MCG (1000 UT) CAPS Take 1,000 Units by mouth in the morning.     ENTRESTO 49-51 MG TAKE 1 TABLET BY MOUTH TWICE A DAY 180 tablet 0   fluticasone (FLONASE) 50 MCG/ACT nasal spray Place 2 sprays into both nostrils daily.     gabapentin (NEURONTIN) 100 MG capsule Take 1 capsule (100 mg total) by mouth 2 (two) times daily. (Patient taking differently: Take 100 mg by mouth 2 (two) times daily as needed (pain).) 30 capsule 1   GLUCOSAMINE-CHONDROITIN PO Take 1 tablet by mouth every evening.      glucose blood (ONETOUCH VERIO) test strip TEST ONCE DAILY AS DIRECTED 90     metFORMIN (GLUCOPHAGE-XR) 500 MG 24 hr tablet Take 500 mg by mouth 2 (two) times daily.     methocarbamol (ROBAXIN) 500 MG tablet Take 1 tablet (500 mg total) by mouth every 6 (six) hours as needed for muscle spasms. 20 tablet 1   metoprolol succinate (TOPROL-XL) 25 MG 24 hr tablet Take 25 mg by mouth daily.     Multiple Vitamin (MULTIVITAMIN WITH MINERALS) TABS tablet Take 1 tablet by mouth daily. Centrum Silver for Women 50+     multivitamin-lutein (OCUVITE-LUTEIN) CAPS capsule Take 1 capsule by mouth every evening.     nitroGLYCERIN (NITROSTAT) 0.4 MG SL tablet Place 0.4 mg under the tongue every 5 (five) minutes as needed for chest pain.     Olopatadine HCl 0.2 % SOLN Place 1 drop into both eyes daily.     ondansetron (ZOFRAN) 4 MG tablet Take 1 tablet (4 mg total) by mouth every 8 (eight) hours as needed for nausea or vomiting. 20 tablet 0   ondansetron (ZOFRAN) 4 MG tablet Take 1 tablet (4 mg total) by mouth every 8 (eight) hours as needed for nausea or vomiting. 20 tablet 0   OneTouch Delica Lancets 23N MISC Apply topically.     OVER THE COUNTER MEDICATION Take 1 capsule by mouth daily. Four Function Brain Support     Polyethyl Glycol-Propyl Glycol (ULTRA  LUBRICANT EYE DROPS OP) Place 1 drop into both eyes 3 (three) times daily as needed (for dry eyes).      PROAIR HFA 108 (90 Base) MCG/ACT inhaler Inhale 2 puffs into the lungs every 6 (six) hours as needed for wheezing or shortness of breath.      Probiotic Product (PROBIOTIC DAILY PO) Take 1 capsule by mouth in the morning.     sertraline (ZOLOFT) 50 MG tablet Take 50 mg by mouth in the morning.     solifenacin (VESICARE) 10 MG tablet Take 10 mg by mouth in the morning.     SUPER B COMPLEX/C PO Take 1 tablet by mouth in the morning.     traMADol (ULTRAM) 50 MG tablet Take 50 mg by mouth every 6 (six) hours as needed for severe pain.     traZODone (DESYREL) 50 MG tablet Take 50  mg by mouth at bedtime as needed for sleep.     No current facility-administered medications for this visit.    PHYSICAL EXAMINATION: ECOG PERFORMANCE STATUS: 1 - Symptomatic but completely ambulatory  Vitals:   11/21/21 0848  BP: (!) 152/74  Pulse: 93  Resp: 18  Temp: 97.8 F (36.6 C)  SpO2: 99%   Filed Weights   11/21/21 0848  Weight: 187 lb 3.2 oz (84.9 kg)      LABORATORY DATA:  I have reviewed the data as listed CMP Latest Ref Rng & Units 09/05/2021 08/29/2021 08/01/2021  Glucose 70 - 99 mg/dL 122(H) 154(H) 120(H)  BUN 8 - 23 mg/dL _0 Creatinine 0.44 - 1.00 mg/dL 0.70 0.59 0.56  Sodium 135 - 145 mmol/L 134(L) 137 138  Potassium 3.5 - 5.1 mmol/L 4.3 4.3 3.9  Chloride 98 - 111 mmol/L 102 103 103  CO2 22 - 32 mmol/L _1 Calcium 8.9 - 10.3 mg/dL 8.9 9.5 9.5  Total Protein 6.4 - 8.3 g/dL - - -  Total Bilirubin 0.20 - 1.20 mg/dL - - -  Alkaline Phos 40 - 150 U/L - - -  AST 5 - 34 U/L - - -  ALT 0 - 55 U/L - - -    Lab Results  Component Value Date   WBC 10.2 09/05/2021   HGB 11.3 (L) 09/05/2021   HCT 35.0 (L) 09/05/2021   MCV 90.0 09/05/2021   PLT 276 09/05/2021   NEUTROABS 2.8 10/06/2013    ASSESSMENT & PLAN:  Malignant neoplasm of upper-outer quadrant of left breast in  female, estrogen receptor negative ([place]) 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 Follows with cardiology.   Return to clinic in 3 weeks for cycle 2   No orders of the defined types were placed in this encounter.  The patient has a good understanding of the overall plan. she agrees with it. she will call with any problems that may develop before the next visit here.  Total time spent: 30 mins including face to face time and time spent for planning, charting and coordination of care  Rulon Eisenmenger, MD, MPH 11/21/2021  I, Thana Ates, am acting as scribe for Dr. Nicholas Lose.  I have reviewed the above documentation for accuracy and completeness, and I agree with the above.

## 2021-11-21 ENCOUNTER — Encounter: Payer: Self-pay | Admitting: *Deleted

## 2021-11-21 ENCOUNTER — Inpatient Hospital Stay: Payer: Medicare Other

## 2021-11-21 ENCOUNTER — Inpatient Hospital Stay (HOSPITAL_BASED_OUTPATIENT_CLINIC_OR_DEPARTMENT_OTHER): Payer: Medicare Other | Admitting: Hematology and Oncology

## 2021-11-21 ENCOUNTER — Other Ambulatory Visit: Payer: Self-pay

## 2021-11-21 DIAGNOSIS — C50411 Malignant neoplasm of upper-outer quadrant of right female breast: Secondary | ICD-10-CM | POA: Diagnosis not present

## 2021-11-21 DIAGNOSIS — Z79899 Other long term (current) drug therapy: Secondary | ICD-10-CM | POA: Diagnosis not present

## 2021-11-21 DIAGNOSIS — C50412 Malignant neoplasm of upper-outer quadrant of left female breast: Secondary | ICD-10-CM

## 2021-11-21 DIAGNOSIS — Z171 Estrogen receptor negative status [ER-]: Secondary | ICD-10-CM

## 2021-11-21 DIAGNOSIS — Z5112 Encounter for antineoplastic immunotherapy: Secondary | ICD-10-CM | POA: Diagnosis not present

## 2021-11-21 DIAGNOSIS — Z170 Estrogen receptor positive status [ER+]: Secondary | ICD-10-CM

## 2021-11-21 MED ORDER — ACETAMINOPHEN 325 MG PO TABS
650.0000 mg | ORAL_TABLET | Freq: Once | ORAL | Status: AC
  Administered 2021-11-21: 650 mg via ORAL
  Filled 2021-11-21: qty 2

## 2021-11-21 MED ORDER — TRASTUZUMAB-HYALURONIDASE-OYSK 600-10000 MG-UNT/5ML ~~LOC~~ SOLN
600.0000 mg | Freq: Once | SUBCUTANEOUS | Status: AC
  Administered 2021-11-21: 600 mg via SUBCUTANEOUS
  Filled 2021-11-21: qty 5

## 2021-11-21 MED ORDER — DIPHENHYDRAMINE HCL 25 MG PO CAPS
25.0000 mg | ORAL_CAPSULE | Freq: Once | ORAL | Status: AC
  Administered 2021-11-21: 25 mg via ORAL
  Filled 2021-11-21: qty 1

## 2021-11-21 NOTE — Progress Notes (Signed)
Patient monitored for 30 minutes post injection with no issues or complaints. Patient discharged in stable condition.

## 2021-11-21 NOTE — Assessment & Plan Note (Signed)
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

## 2021-11-21 NOTE — Patient Instructions (Signed)
[facility]   Discharge Instructions: Thank you for choosing [facility] to provide your oncology and hematology care.   If you have a lab appointment with the [facility], please go directly to the [facility] and check in at the registration area.   Wear comfortable clothing and clothing appropriate for easy access to any Portacath or PICC line.   We strive to give you quality time with your provider. You may need to reschedule your appointment if you arrive late (15 or more minutes).  Arriving late affects you and other patients whose appointments are after yours.  Also, if you miss three or more appointments without notifying the office, you may be dismissed from the clinic at the providers discretion.      For prescription refill requests, have your pharmacy contact our office and allow 72 hours for refills to be completed.    Today you received the following chemotherapy and/or immunotherapy agents: trastuzumab-hyaluronidase-oysk      To help prevent nausea and vomiting after your treatment, we encourage you to take your nausea medication as directed.  BELOW ARE SYMPTOMS THAT SHOULD BE REPORTED IMMEDIATELY: *FEVER GREATER THAN 100.4 F (38 C) OR HIGHER *CHILLS OR SWEATING *NAUSEA AND VOMITING THAT IS NOT CONTROLLED WITH YOUR NAUSEA MEDICATION *UNUSUAL SHORTNESS OF BREATH *UNUSUAL BRUISING OR BLEEDING *URINARY PROBLEMS (pain or burning when urinating, or frequent urination) *BOWEL PROBLEMS (unusual diarrhea, constipation, pain near the anus) TENDERNESS IN MOUTH AND THROAT WITH OR WITHOUT PRESENCE OF ULCERS (sore throat, sores in mouth, or a toothache) UNUSUAL RASH, SWELLING OR PAIN  UNUSUAL VAGINAL DISCHARGE OR ITCHING   Items with * indicate a potential emergency and should be followed up as soon as possible or go to the Emergency Department if any problems should occur.  Please show the CHEMOTHERAPY ALERT CARD or IMMUNOTHERAPY  ALERT CARD at check-in to the Emergency Department and triage nurse.  Should you have questions after your visit or need to cancel or reschedule your appointment, please contact [facility]  Dept: [phone]  and follow the prompts.  Office hours are 8:00 a.m. to 4:30 p.m. Monday - Friday. Please note that voicemails left after 4:00 p.m. may not be returned until the following business day.  We are closed weekends and major holidays. You have access to a nurse at all times for urgent questions. Please call the main number to the clinic Dept: [phone] and follow the prompts.   For any non-urgent questions, you may also contact your provider using MyChart. We now offer e-Visits for anyone 71 and older to request care online for non-urgent symptoms. For details visit [email or web address].   Also download the MyChart app! Go to the app store, search "MyChart", open the app, select [facility], and log in with your MyChart username and password.  Due to Covid, a mask is required upon entering the hospital/clinic. If you do not have a mask, one will be given to you upon arrival. For doctor visits, patients may have 1 support person aged 48 or older with them. For treatment visits, patients cannot have anyone with them due to current Covid guidelines and our immunocompromised population.   Trastuzumab; Hyaluronidase injection What is this medication? TRASTUZUMAB; HYALURONIDASE (tras TOO zoo mab / hye al ur ON i dase) is used to treat breast cancer and stomach cancer. Trastuzumab is a monoclonal antibody. Hyaluronidase is used to improve the effects of trastuzumab. This medicine may be used  for other purposes; ask your health care provider or pharmacist if you have questions. COMMON BRAND NAME(S): HERCEPTIN HYLECTA What should I tell my care team before I take this medication? They need to know if you have any of these conditions: heart disease heart failure lung or  breathing disease, like asthma an unusual or allergic reaction to trastuzumab, or other medications, foods, dyes, or preservatives pregnant or trying to get pregnant breast-feeding How should I use this medication? This medicine is for injection under the skin. It is given by a health care professional in a hospital or clinic setting. Talk to your pediatrician regarding the use of this medicine in children. This medicine is not approved for use in children. Overdosage: If you think you have taken too much of this medicine contact a poison control center or emergency room at once. NOTE: This medicine is only for you. Do not share this medicine with others. What if I miss a dose? It is important not to miss a dose. Call your doctor or health care professional if you are unable to keep an appointment. What may interact with this medication? This medicine may interact with the following medications: certain types of chemotherapy, such as daunorubicin, doxorubicin, epirubicin, and idarubicin This list may not describe all possible interactions. Give your health care provider a list of all the medicines, herbs, non-prescription drugs, or dietary supplements you use. Also tell them if you smoke, drink alcohol, or use illegal drugs. Some items may interact with your medicine. What should I watch for while using this medication? Visit your doctor for checks on your progress. Report any side effects. Continue your course of treatment even though you feel ill unless your doctor tells you to stop. Call your doctor or health care professional for advice if you get a fever, chills or sore throat, or other symptoms of a cold or flu. Do not treat yourself. Try to avoid being around people who are sick. You may experience fever, chills and shaking during your first infusion. These effects are usually mild and can be treated with other medicines. Report any side effects during the infusion to your health care  professional. Fever and chills usually do not happen with later infusions. Do not become pregnant while taking this medicine or for 7 months after stopping it. Women should inform their doctor if they wish to become pregnant or think they might be pregnant. Women of child-bearing potential will need to have a negative pregnancy test before starting this medicine. There is a potential for serious side effects to an unborn child. Talk to your health care professional or pharmacist for more information. Do not breast-feed an infant while taking this medicine or for 7 months after stopping it. What side effects may I notice from receiving this medication? Side effects that you should report to your doctor or health care professional as soon as possible: allergic reactions like skin rash, itching or hives, swelling of the face, lips, or tongue breathing problems chest pain or palpitations cough fever general ill feeling or flu-like symptoms signs of worsening heart failure like breathing problems; swelling in your legs and feet Side effects that usually do not require medical attention (report these to your doctor or health care professional if they continue or are bothersome): bone pain changes in taste diarrhea joint pain nausea/vomiting unusually weak or tired weight loss This list may not describe all possible side effects. Call your doctor for medical advice about side effects. You may report side  effects to FDA at 1-800-FDA-1088. Where should I keep my medication? This drug is given in a hospital or clinic and will not be stored at home. NOTE: This sheet is a summary. It may not cover all possible information. If you have questions about this medicine, talk to your doctor, pharmacist, or health care provider.  2022 [employer]vier/Gold Standard (2018-02-24 00:00:00)

## 2021-11-22 ENCOUNTER — Telehealth: Payer: Self-pay

## 2021-11-22 ENCOUNTER — Encounter: Payer: Self-pay | Admitting: Hematology and Oncology

## 2021-11-22 NOTE — Telephone Encounter (Signed)
-----   Message from Clyda Hurdle, RN sent at 11/21/2021  2:21 PM EST ----- Regarding: 1st Time Herceptin-Dr Lindi Adie 1st Time SQ trastuzumab-hyaluronidase  Dr Geralyn Flash patient

## 2021-11-22 NOTE — Telephone Encounter (Signed)
Ms Kosier states that she is doing well.  She feels tired and  a little weak.  She did not get in 64 oz of fluid yesterday.  She is working on that today. Eating and urinating well. 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 Injection site looks good. She knows to call the [phone] number if she has any questions or concerns.

## 2021-11-23 ENCOUNTER — Other Ambulatory Visit: Payer: Self-pay

## 2021-11-23 ENCOUNTER — Ambulatory Visit (INDEPENDENT_AMBULATORY_CARE_PROVIDER_SITE_OTHER): Payer: Medicare Other | Admitting: Plastic Surgery

## 2021-11-23 ENCOUNTER — Other Ambulatory Visit: Payer: Self-pay | Admitting: *Deleted

## 2021-11-23 DIAGNOSIS — C50412 Malignant neoplasm of upper-outer quadrant of left female breast: Secondary | ICD-10-CM

## 2021-11-23 DIAGNOSIS — E1165 Type 2 diabetes mellitus with hyperglycemia: Secondary | ICD-10-CM | POA: Diagnosis not present

## 2021-11-23 DIAGNOSIS — E7800 Pure hypercholesterolemia, unspecified: Secondary | ICD-10-CM | POA: Diagnosis not present

## 2021-11-23 DIAGNOSIS — I10 Essential (primary) hypertension: Secondary | ICD-10-CM | POA: Diagnosis not present

## 2021-11-23 DIAGNOSIS — E059 Thyrotoxicosis, 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: Secondary | ICD-10-CM | POA: Diagnosis not present

## 2021-11-23 DIAGNOSIS — Z9889 Other specified postprocedural states: Secondary | ICD-10-CM

## 2021-11-23 NOTE — Progress Notes (Signed)
Received call from pt with complaint of dysuria x3 days.  Pt states she is drinking 80 oz of water daily and is only urinating 2-3 times a day.  Pt denies shortness of breath, swelling, bladder fullness or burring with urination.  Per MD pt needing labs, UA, and f/u with NP for further evaluation.  Orders placed, appt scheduled and pt verbalized understanding of date and time.

## 2021-11-23 NOTE — Progress Notes (Signed)
Patient presents in follow-up for continued expansion of breast tissue expanders.  She feels like things are going well.  She started her HER2 treatment and feels like that is going well.  She reports that the left side seems a little bit bigger than it was when she left the office last time.  On exam she does have a bit of a larger volume on the left side and some of that appears to be seroma which we have been managing with serial aspirations.  I prepped both sides with alcohol pad and infiltrated 50 cc into both expanders.  I was able to aspirate 50 cc of seroma from the left side after the expansion.  Her incisions continue to develop look fine and there is no signs of infection.  I would estimate at this point she has around 250 cc of volume in each expander.  We will plan to see her in 2 weeks and continue the process.  I continue to convey my concerns about the persistent seroma but so far we have managed to keep the process moving forward.

## 2021-11-24 ENCOUNTER — Encounter: Payer: Self-pay | Admitting: Adult Health

## 2021-11-24 ENCOUNTER — Telehealth: Payer: Self-pay | Admitting: *Deleted

## 2021-11-24 ENCOUNTER — Inpatient Hospital Stay: Payer: Medicare Other | Attending: Hematology and Oncology

## 2021-11-24 ENCOUNTER — Other Ambulatory Visit: Payer: Self-pay

## 2021-11-24 ENCOUNTER — Inpatient Hospital Stay (HOSPITAL_BASED_OUTPATIENT_CLINIC_OR_DEPARTMENT_OTHER): Payer: Medicare Other | Admitting: Adult Health

## 2021-11-24 VITALS — BP 149/64 | HR 89 | Temp 97.7°F | Resp 16 | Ht 67.0 in | Wt 190.9 lb

## 2021-11-24 DIAGNOSIS — C50411 Malignant neoplasm of upper-outer quadrant of right female breast: Secondary | ICD-10-CM

## 2021-11-24 DIAGNOSIS — R5383 Other fatigue: Secondary | ICD-10-CM | POA: Diagnosis not present

## 2021-11-24 DIAGNOSIS — R519 Headache, unspecified: Secondary | ICD-10-CM | POA: Insufficient documentation

## 2021-11-24 DIAGNOSIS — Z803 Family history of malignant neoplasm of breast: Secondary | ICD-10-CM | POA: Diagnosis not present

## 2021-11-24 DIAGNOSIS — Z171 Estrogen receptor negative status [ER-]: Secondary | ICD-10-CM | POA: Insufficient documentation

## 2021-11-24 DIAGNOSIS — C50412 Malignant neoplasm of upper-outer quadrant of left female breast: Secondary | ICD-10-CM | POA: Insufficient documentation

## 2021-11-24 DIAGNOSIS — Z9013 Acquired absence of bilateral breasts and nipples: Secondary | ICD-10-CM | POA: Diagnosis not present

## 2021-11-24 DIAGNOSIS — Z170 Estrogen receptor positive status [ER+]: Secondary | ICD-10-CM

## 2021-11-24 DIAGNOSIS — Z5112 Encounter for antineoplastic immunotherapy: Secondary | ICD-10-CM | POA: Diagnosis not present

## 2021-11-24 LAB — CBC WITH DIFFERENTIAL (CANCER CENTER ONLY)
Abs Immature Granulocytes: 0.02 10*3/uL (ref 0.00–0.07)
Basophils Absolute: 0 10*3/uL (ref 0.0–0.1)
Basophils Relative: 1 %
Eosinophils Absolute: 0.4 10*3/uL (ref 0.0–0.5)
Eosinophils Relative: 6 %
HCT: 32.8 % — ABNORMAL LOW (ref 36.0–46.0)
Hemoglobin: 10.7 g/dL — ABNORMAL LOW (ref 12.0–15.0)
Immature Granulocytes: 0 %
Lymphocytes Relative: 34 %
Lymphs Abs: 2.1 10*3/uL (ref 0.7–4.0)
MCH: 28.3 pg (ref 26.0–34.0)
MCHC: 32.6 g/dL (ref 30.0–36.0)
MCV: 86.8 fL (ref 80.0–100.0)
Monocytes Absolute: 0.6 10*3/uL (ref 0.1–1.0)
Monocytes Relative: 10 %
Neutro Abs: 3 10*3/uL (ref 1.7–7.7)
Neutrophils Relative %: 49 %
Platelet Count: 353 10*3/uL (ref 150–400)
RBC: 3.78 MIL/uL — ABNORMAL LOW (ref 3.87–5.11)
RDW: 13 % (ref 11.5–15.5)
WBC Count: 6.2 10*3/uL (ref 4.0–10.5)
nRBC: 0 % (ref 0.0–0.2)

## 2021-11-24 LAB — URINALYSIS, COMPLETE (UACMP) WITH MICROSCOPIC
Bilirubin Urine: NEGATIVE
Glucose, UA: NEGATIVE mg/dL
Hgb urine dipstick: NEGATIVE
Ketones, ur: NEGATIVE mg/dL
Nitrite: NEGATIVE
Protein, ur: NEGATIVE mg/dL
Specific Gravity, Urine: 1.01 (ref 1.005–1.030)
pH: 6 (ref 5.0–8.0)

## 2021-11-24 LAB — CMP (CANCER CENTER ONLY)
ALT: 14 U/L (ref 0–44)
AST: 13 U/L — ABNORMAL LOW (ref 15–41)
Albumin: 4.4 g/dL (ref 3.5–5.0)
Alkaline Phosphatase: 82 U/L (ref 38–126)
Anion gap: 5 (ref 5–15)
BUN: 15 mg/dL (ref 8–23)
CO2: 28 mmol/L (ref 22–32)
Calcium: 10.1 mg/dL (ref 8.9–10.3)
Chloride: 103 mmol/L (ref 98–111)
Creatinine: 0.69 mg/dL (ref 0.44–1.00)
GFR, Estimated: >60 mL/min (ref >60)
Glucose, Bld: 85 mg/dL (ref 70–99)
Potassium: 4 mmol/L (ref 3.5–5.1)
Sodium: 136 mmol/L (ref 135–145)
Total Bilirubin: 0.3 mg/dL (ref 0.3–1.2)
Total Protein: 7.9 g/dL (ref 6.5–8.1)

## 2021-11-24 NOTE — Assessment & Plan Note (Addendum)
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  After review of everything I recommended that she continue to take Tylenol and Motrin alternating.  I also suggested that she use a nasal spray which she has to help with her nasal congestion as this could be related to her headache.  She has no neurologic deficits therefore I will not order an MRI of the brain at this time, however if the after mentioned interventions do not alleviate her symptoms we will consider it and she will call me on Monday to let me know.  I have ordered a urinalysis to further evaluate her urinary frequency.  We will call her with the results which should be available later this afternoon.  She does have a history of slightly decreased ejection fraction.  Should her fatigue persist we may need to repeat echocardiogram.  Jordan Willis is scheduled to return on February 21 for Herceptin and in March for follow-up  with [name] and Herceptin.

## 2021-11-24 NOTE — Telephone Encounter (Signed)
This RN contacted pt per [facility]/NP and informed her urine was negative for  an infection.

## 2021-11-24 NOTE — Progress Notes (Signed)
[facility] up:    Jordan Pretty, MD [address] [place] [address]   DIAGNOSIS:  Cancer Staging  Malignant neoplasm of upper-outer quadrant of left breast in female, estrogen receptor negative ([place]) Staging form: Breast, AJCC 7th Edition - Clinical: Stage IA (rT1c, N0, M0) - Signed by Nicholas Lose, MD on 05/31/2021 Stage prefix: Recurrence Laterality: Right Biopsy of metastatic site performed: No Estrogen receptor status: Positive Progesterone receptor status: Negative HER2 status: Positive - Pathologic: Stage IIA (T2, N0, cM0) - Signed by Nicholas Lose, MD on 09/12/2021 Estrogen receptor status: Positive Progesterone receptor status: Positive HER2 status: Negative   SUMMARY OF ONCOLOGIC HISTORY: Oncology History  Malignant neoplasm of upper-outer quadrant of left breast in female, estrogen receptor negative ([place])  02/02/2005 Initial Diagnosis   Left breast needle core biopsy showed invasive mammary cancer   02/11/2005 Breast MRI   Left breast upper outer quadrant: 4.4 x 2.6 x 3.7 cm mass and a smaller adjacent mass measuring 1 x 0.8 x 0.4 cm   03/02/2005 - 07/20/2005 Neo-Adjuvant Chemotherapy   AC4 followed by Taxotere 4   08/03/2005 Surgery   Left breast lumpectomy: 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%, PR 0%, HER-2 negative, Ki-67 31%   09/26/2005 - 11/13/2005 Radiation Therapy   Adjuvant radiation therapy   03/18/2008 Initial Biopsy   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   03/22/2008 Surgery   Right breast lumpectomy 0.9 cm DCIS ER 90% PR 98% stage 0   04/11/2008 Procedure   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   05/10/2008 - 07/02/2008 Radiation Therapy   Adjuvant radiation therapy   07/17/2008 - 04/16/2009 Anti-estrogen oral therapy   Femara then switched to tamoxifen which was discontinued in 2010 due  to concern about uterine cancer   05/23/2021 Relapse/Recurrence   Screening detected right breast mass at 10 o'clock position measuring 1.2 cm, ultrasound-guided biopsy revealed grade 3 IDC ER 20%, PR 0%, HER2 positive 3+, Ki-67 50%   05/31/2021 Cancer Staging   Staging form: Breast, AJCC 7th Edition - Clinical: Stage IA (rT1c, N0, M0) - Signed by Nicholas Lose, MD on 05/31/2021 Stage prefix: Recurrence Laterality: Right Biopsy of metastatic site performed: No Estrogen receptor status: Positive Progesterone receptor status: Negative HER2 status: Positive    09/04/2021 Surgery   Bilateral mastectomies Left mastectomy: Benign Right mastectomy: Grade 3 IDC with DCIS 3.6 cm, 0/6 lymph nodes negative, ER 20%, PR 0%, HER2 positive by IHC, Ki-67 50%   09/12/2021 Cancer Staging   Staging form: Breast, AJCC 7th Edition - Pathologic: Stage IIA (T2, N0, cM0) - Signed by Nicholas Lose, MD on 09/12/2021 Estrogen receptor status: Positive Progesterone receptor status: Positive HER2 status: Negative    Breast cancer of upper-outer quadrant of right female breast ([place])  09/04/2021 Initial Diagnosis   Breast cancer of upper-outer quadrant of right female breast ([place])   11/21/2021 -  Chemotherapy   Patient is on Treatment Plan : BREAST Trastuzumab q21d       CURRENT THERAPY: Herceptin [place]  INTERVAL HISTORY: Jordan [place] 71 y.o. female returns for urgent evaluation.  She received Herceptin earlier this week and since that time has noted increased fatigue and headache.  She also has been urinating more than she is used to.  She was taking Vesicare and had stopped taking it because she felt like she was not urinating enough  however last night she was urinating so frequently that she also had urge incontinence and was not able to make it to the bathroom at times.   Patient Active Problem List   Diagnosis Date Noted   Breast cancer of upper-outer quadrant of right female breast ([place]) 09/04/2021    Diabetic neuropathy ([place]) 02/08/2021   Angina pectoris ([place]) 02/09/2020   Abnormal stress test 02/09/2020   Complete rotator cuff tear 09/03/2018   Pelvic prolapse 05/16/2017   Intractable chronic post-traumatic headache 11/19/2016   Malignant neoplasm of upper-outer quadrant of left breast in female, estrogen receptor negative ([place]) 03/03/2012   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   Menopause 03/03/2012    is allergic to bactrim [sulfamethoxazole-trimethoprim], aspirin, and tape.  MEDICAL HISTORY: Past Medical History:  Diagnosis Date   Anemia    Anginal pain (HCC)    Anxiety    Arthritis    Lumbar spine DDD.   Breast cancer, female, left 03/03/2012   Cardiomyopathy ([place])    Carpal tunnel syndrome    Bilateral, Mild   Cerebral atherosclerosis    Chronic sinusitis    DCIS (ductal carcinoma in situ) of breast, right 03/03/2012   Diabetes mellitus without complication (HCC)    metformin   Dyspnea    When patient gets sick uses Pro-Air inhaler   Frequent sinus infections    GERD (gastroesophageal reflux disease)    occ   Goiter    History of cardiomegaly    History of cataract    Bilateral   History of kidney stones    08/28/2018 currently has a large kidney stone, asymptomatic at this time   History of migraine    History of uterine fibroid    History of uterine prolapse    Hyperlipidemia    Hypertension    LBBB (left bundle branch block)    Menopause 03/03/2012   Nasal turbinate hypertrophy 11/29/2016   Left inferior    Personal history of chemotherapy    Personal history of radiation therapy    Pneumonia    as a child   PONV (postoperative nausea and vomiting)    Sinusitis 2014   Sleep apnea    No CPAP   SUI (stress urinary incontinence, female)    SVD (spontaneous vaginal delivery)    x 2   Thyroid nodule     SURGICAL HISTORY: Past Surgical History:  Procedure Laterality Date   ABDOMINAL HYSTERECTOMY     BILATERAL  SALPINGOOPHORECTOMY Bilateral    BLADDER SUSPENSION N/A 05/16/2017   Procedure: TRANSVAGINAL TAPE (TVT) PROCEDURE;  Surgeon: Everett Graff, MD;  Location: [facility];  Service: Gynecology;  Laterality: N/A;   BREAST BIOPSY Right 2009   BREAST LUMPECTOMY Right 2009   BREAST LUMPECTOMY Left 2006   BREAST RECONSTRUCTION WITH PLACEMENT OF TISSUE EXPANDER AND FLEX HD (ACELLULAR HYDRATED DERMIS) Bilateral 09/04/2021   Procedure: BILATERAL BREAST RECONSTRUCTION WITH PLACEMENT OF TISSUE EXPANDER AND FLEX HD (ACELLULAR HYDRATED DERMIS);  Surgeon: [name], MD;  Location: [facility];  Service: Plastics;  Laterality: Bilateral;   CARDIAC CATHETERIZATION     CATARACT EXTRACTION Left 10/23/2017   CATARACT EXTRACTION Right 11/06/2017   COLONOSCOPY  10/22/2009   Normal.  Repeat 5 years.  [name]   CYSTOSCOPY N/A 05/16/2017   Procedure: CYSTOSCOPY;  Surgeon: Everett Graff, MD;  Location: [facility];  Service: Gynecology;  Laterality: N/A;   DEBRIDEMENT AND CLOSURE WOUND Bilateral 10/03/2021   Procedure: Debridement bilateral mastectomy flaps;  Surgeon:  [name], MD;  Location: [facility];  Service: Plastics;  Laterality: Bilateral;  1.5 hour   EYE SURGERY     bilateral cataracts   LEFT HEART CATH AND CORONARY ANGIOGRAPHY N/A 02/09/2020   Procedure: LEFT HEART CATH AND CORONARY ANGIOGRAPHY;  Surgeon: Nigel Mormon, MD;  Location: [facility];  Service: Cardiovascular;  Laterality: N/A;   MASTECTOMY W/ SENTINEL NODE BIOPSY Right 09/04/2021   Procedure: BILATERAL MASTECTOMIES WITH RIGHT SENTINEL LYMPH NODE BIOPSY;  Surgeon: Stark Klein, MD;  Location: [facility];  Service: General;  Laterality: Right;   REMOVAL OF BILATERAL TISSUE EXPANDERS WITH PLACEMENT OF BILATERAL BREAST IMPLANTS Bilateral 10/03/2021   Procedure: REMOVAL OF BILATERAL TISSUE EXPANDERS WITH REPLACEMENT OF TISSUE EXPANDERS;  Surgeon: [name], MD;  Location: [facility];  Service: Plastics;  Laterality: Bilateral;   ROBOTIC  ASSISTED TOTAL HYSTERECTOMY N/A 05/16/2017   Procedure: ROBOTIC ASSISTED TOTAL HYSTERECTOMY;  Surgeon: Delsa Bern, MD;  Location: [facility];  Service: Gynecology;  Laterality: N/A;   ROTATOR CUFF REPAIR Bilateral    SHOULDER ARTHROSCOPY WITH ROTATOR CUFF REPAIR AND SUBACROMIAL DECOMPRESSION Right 09/03/2018   Procedure: Right shoulder manipulation under anesthesia, exam under anesthesia, mini open rotator cuff repair, subacromial decompression;  Surgeon: Susa Day, MD;  Location: [facility];  Service: Orthopedics;  Laterality: Right;  90 mins   TUBAL LIGATION     WISDOM TOOTH EXTRACTION      SOCIAL HISTORY: Social History   Socioeconomic History   Marital status: Divorced    Spouse name: n/a   Number of children: 2   Years of education: 12   Highest education level: Not on file  Occupational History   Occupation: Retired    Fish farm manager: [employer]    Comment: Psychologist, forensic  Tobacco Use   Smoking status: Never   Smokeless tobacco: Never  Vaping Use   Vaping Use: Never used  Substance and Sexual Activity   Alcohol use: No   Drug use: No   Sexual activity: Not Currently    Partners: Male    Birth control/protection: Surgical, Post-menopausal    Comment: Hysterectomy  Other Topics Concern   Not on file  Social History Narrative   Marital status: divorced; dating seriously x many years.       Children:  2 children; 1 grandchild.      Employment: Personnel officer.      Lives: alone      Tobacco: none       Alcohol: none      Exercise:  Walking with sister two days per week.      Seatbelt: 100%      Guns: none      Right-handed   Caffeine: occasional coffee, hot tea or green tea a few times per week   Social Determinants of Health   Financial Resource Strain: Not on file  Food Insecurity: Not on file  Transportation Needs: Not on file  Physical Activity: Not on file  Stress: Not on file  Social Connections: Not on file  Intimate Partner Violence: Not on file     FAMILY HISTORY: Family History  Problem Relation Age of Onset   Cancer Mother 13       breast cancer   Hypertension Mother    Diabetes Father    Cancer Sister        breast cancer   Breast cancer Sister    Cancer Sister        breast cancer  Breast cancer Sister     Review of Systems  Constitutional:  Positive for fatigue. Negative for appetite change, chills, fever and unexpected weight change.  HENT:   Negative for hearing loss, lump/mass and trouble swallowing.   Eyes:  Negative for eye problems and icterus.  Respiratory:  Negative for chest tightness, cough and shortness of breath.   Cardiovascular:  Negative for chest pain, leg swelling and palpitations.  Gastrointestinal:  Negative for abdominal distention, abdominal pain, constipation, diarrhea, nausea and vomiting.  Endocrine: Negative for hot flashes.  Genitourinary:  Positive for bladder incontinence, frequency and nocturia. Negative for difficulty urinating and dysuria.   Musculoskeletal:  Negative for arthralgias and gait problem.  Skin:  Negative for itching and rash.  Neurological:  Positive for headaches. Negative for dizziness, extremity weakness, gait problem, light-headedness, numbness, seizures and speech difficulty.  Hematological:  Negative for adenopathy. Does not bruise/bleed easily.  Psychiatric/Behavioral:  Negative for depression. The patient is not nervous/anxious.      PHYSICAL EXAMINATION  ECOG PERFORMANCE STATUS: 1 - Symptomatic but completely ambulatory  Vitals:   11/24/21 1415  BP: (!) 149/64  Pulse: 89  Resp: 16  Temp: 97.7 F (36.5 C)  SpO2: 100%    Physical Exam Constitutional:      General: She is not in acute distress.    Appearance: Normal appearance. She is not toxic-appearing.  HENT:     Head: Normocephalic and atraumatic.  Eyes:     General: No scleral icterus. Cardiovascular:     Rate and Rhythm: Normal rate and regular rhythm.     Pulses: Normal pulses.      Heart sounds: Normal heart sounds.  Pulmonary:     Effort: Pulmonary effort is normal.     Breath sounds: Normal breath sounds.  Abdominal:     General: Abdomen is flat. Bowel sounds are normal. There is no distension.     Palpations: Abdomen is soft.     Tenderness: There is no abdominal tenderness.  Musculoskeletal:        General: No swelling.     Cervical back: Neck supple.  Lymphadenopathy:     Cervical: No cervical adenopathy.  Skin:    General: Skin is warm and dry.     Findings: No rash.  Neurological:     General: No focal deficit present.     Mental Status: She is alert.  Psychiatric:        Mood and Affect: Mood normal.        Behavior: Behavior normal.    LABORATORY DATA:  CBC    Component Value Date/Time   WBC 6.2 11/24/2021 1355   WBC 10.2 09/05/2021 0136   RBC 3.78 (L) 11/24/2021 1355   HGB 10.7 (L) 11/24/2021 1355   HGB 14.9 10/06/2013 0859   HCT 32.8 (L) 11/24/2021 1355   HCT 43.2 10/06/2013 0859   PLT 353 11/24/2021 1355   PLT 295 10/06/2013 0859   MCV 86.8 11/24/2021 1355   MCV 94.3 11/24/2013 0859   MCV 88.9 10/06/2013 0859   MCH 28.3 11/24/2021 1355   MCHC 32.6 11/24/2021 1355   RDW 13.0 11/24/2021 1355   RDW 13.0 10/06/2013 0859   LYMPHSABS 2.1 11/24/2021 1355   LYMPHSABS 2.6 10/06/2013 0859   MONOABS 0.6 11/24/2021 1355   MONOABS 0.4 10/06/2013 0859   EOSABS 0.4 11/24/2021 1355   EOSABS 0.2 10/06/2013 0859   BASOSABS 0.0 11/24/2021 1355   BASOSABS 0.1 10/06/2013 0859    CMP  Component Value Date/Time   NA 136 11/24/2021 1355   NA 137 10/13/2020 1001   NA 139 10/06/2013 0900   K 4.0 11/24/2021 1355   K 3.6 10/06/2013 0900   CL 103 11/24/2021 1355   CL 104 02/20/2013 1109   CO2 28 11/24/2021 1355   CO2 24 10/06/2013 0900   GLUCOSE 85 11/24/2021 1355   GLUCOSE 106 10/06/2013 0900   GLUCOSE 98 02/20/2013 1109   BUN 15 11/24/2021 1355   BUN 11 10/13/2020 1001   BUN 10.4 10/06/2013 0900   CREATININE 0.69 11/24/2021 1355    CREATININE 0.7 10/06/2013 0900   CALCIUM 10.1 11/24/2021 1355   CALCIUM 10.3 10/06/2013 0900   PROT 7.9 11/24/2021 1355   PROT 8.6 (H) 10/06/2013 0900   ALBUMIN 4.4 11/24/2021 1355   ALBUMIN 4.4 10/06/2013 0900   AST 13 (L) 11/24/2021 1355   AST 22 10/06/2013 0900   ALT 14 11/24/2021 1355   ALT 25 10/06/2013 0900   ALKPHOS 82 11/24/2021 1355   ALKPHOS 133 10/06/2013 0900   BILITOT 0.3 11/24/2021 1355   BILITOT 0.24 10/06/2013 0900   GFRNONAA >60 11/24/2021 1355   GFRAA 111 10/13/2020 1001     ASSESSMENT and THERAPY PLAN:   Malignant neoplasm of upper-outer quadrant of left breast in female, estrogen receptor negative ([place]) 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% status post lumpectomy radiation and 5 years of tamoxifen completed 04/16/2009 Recurrence: 05/23/2021: Screening mammogram detected right breast mass 10 o'clock position 1.2 cm, biopsy with grade 3 IDC ER 20%, PR 0%, HER2 positive, Ki-67 5%) ---------------------------------------------------------------------------------------------------------------------------------------------------- Current treatment: Cycle 1 adjuvant Herceptin [place] Echocardiogram 10/13/2021: EF 45 to 50% (global strain: -13.2%)  Jordan Willis is here for follow-up since receiving Herceptin earlier this week.  After review of everything I recommended that she continue to take Tylenol and Motrin alternating.  I also suggested that she use a nasal spray which she has to help with her nasal congestion as  this could be related to her headache.  She has no neurologic deficits therefore I will not order an MRI of the brain at this time, however if the after mentioned interventions do not alleviate her symptoms we will consider it and she will call me on Monday to let me know.  I have ordered a urinalysis to further evaluate her urinary frequency.  We will call her with the results which should be available later this afternoon.  She does have a history of slightly decreased ejection fraction.  Should her fatigue persist we may need to repeat echocardiogram.  Desirre is scheduled to return on February 21 for Herceptin and in March for follow-up with [name] and Herceptin.   All questions were answered. The patient knows to call the clinic with any problems, questions or concerns. We can certainly see the patient much sooner if necessary.  Total encounter time: 20 minutes in face-to-face visit time, chart review, lab review, care coordination, order entry, and documentation of the encounter.   Wilber Bihari, NP 11/24/21 4:02 PM Medical Oncology and Hematology [place]  [place], [place] [address] Tel. [phone]    Fax. [phone]  *Total Encounter Time as defined by the Centers for Medicare and Medicaid Services includes, in addition to the face-to-face time of a patient visit (documented in the note above) non-face-to-face time: obtaining and reviewing outside history, ordering and reviewing medications, tests or procedures, care coordination (communications with other health care professionals or caregivers) and documentation in the medical record.

## 2021-11-26 LAB — URINE CULTURE: Culture: NO GROWTH

## 2021-11-27 ENCOUNTER — Other Ambulatory Visit: Payer: Self-pay

## 2021-11-27 ENCOUNTER — Ambulatory Visit: Payer: Medicaid Other | Admitting: Cardiology

## 2021-11-27 ENCOUNTER — Encounter: Payer: Self-pay | Admitting: Cardiology

## 2021-11-27 VITALS — BP 140/64 | HR 77 | Ht 67.0 in | Wt 192.8 lb

## 2021-11-27 DIAGNOSIS — I447 Left bundle-branch block, unspecified: Secondary | ICD-10-CM

## 2021-11-27 DIAGNOSIS — E782 Mixed hyperlipidemia: Secondary | ICD-10-CM | POA: Diagnosis not present

## 2021-11-27 DIAGNOSIS — C50412 Malignant neoplasm of upper-outer quadrant of left female breast: Secondary | ICD-10-CM

## 2021-11-27 DIAGNOSIS — I10 Essential (primary) hypertension: Secondary | ICD-10-CM | POA: Diagnosis not present

## 2021-11-27 DIAGNOSIS — Z171 Estrogen receptor negative status [ER-]: Secondary | ICD-10-CM | POA: Diagnosis not present

## 2021-11-27 DIAGNOSIS — I428 Other cardiomyopathies: Secondary | ICD-10-CM | POA: Diagnosis not present

## 2021-11-27 DIAGNOSIS — E119 Type 2 diabetes mellitus without complications: Secondary | ICD-10-CM

## 2021-11-27 MED ORDER — DAPAGLIFLOZIN PROPANEDIOL 10 MG PO TABS: 10.0000 mg | ORAL_TABLET | Freq: Every day | ORAL | 0 refills | Status: DC

## 2021-11-27 NOTE — Progress Notes (Signed)
[name] Date of Birth: 1951-03-06 MRN: [id number] Primary Care Provider:Pharr, Thayer Jew, MD  Former Cardiology Providers: Miquel Dunn, MSN, APRN, FNP-C Primary Cardiologist: Rex Kras, DO (established care 01/20/2020)  Date: 11/27/21 Last Office Visit: 05/26/2021   Chief Complaint  Patient presents with   Cardiomyopathy   Follow-up     HPI  Jordan Willis is a 71 y.o.  female who presents to the office with a chief complaint of  "cardiomyopathy management." Patient's past medical history and cardiovascular risk factors include: Left bundle branch block, type 2 diabetes mellitus, hyperlipidemia, nonischemic cardiomyopathy, recurrent breast cancer status post bilateral mastectomies, postmenopausal female, advanced age.  Patient is being follow by the practice for the management of NICMP but referred for chest pain evaluation.   Patient has history of breast cancer for which she was in remission however had reduced LVEF with normal epicardial coronaries per recent angiography.  Initially she was hesitant to be on pharmacological therapy but after educating her on the importance she is currently on Entresto, Toprol-XL.   Since last office visit patient was diagnosed with breast cancer and has undergone bilateral mastectomies as of November 2022.  Postsurgery the course was complicated by infection and the need to go back to the OR and IV antibiotics.  She started chemo on November 20, 2021 and is scheduled every 3 weeks.  She denies any angina pectoris.  She denies orthopnea, paroxysmal nocturnal dyspnea or lower extremity swelling.  However does notice shortness of breath with effort related activities more than usual.  Patient states that her overall physical/functional status is limited but is motivated to start working out.  Most recent echocardiogram from December 2022 independently reviewed as part of today's office visit and discussed with her.    FUNCTIONAL STATUS: No  structured exercise program or daily routine.  ALLERGIES: Allergies  Allergen Reactions   Bactrim [Sulfamethoxazole-Trimethoprim] Shortness Of Breath    Shortness of breath, difficulty breathing, headache, rash, fatigue   Aspirin Palpitations and Other (See Comments)    Heart fluttering, pt takes 40.5 mg (half of 81 mg) daily   Tape Rash   MEDICATION LIST PRIOR TO VISIT: Current Outpatient Medications on File Prior to Visit  Medication Sig Dispense Refill   acetaminophen (TYLENOL) 500 MG tablet Take 1,000 mg by mouth every 6 (six) hours as needed for moderate pain.     Ascorbic Acid (VITAMIN C PO) Take by mouth in the morning.     aspirin 81 MG EC tablet Take 40.5 mg by mouth in the morning. Swallow whole.     atorvastatin (LIPITOR) 20 MG tablet TAKE 1 TABLET BY MOUTH EVERYDAY AT BEDTIME 90 tablet 1   Biotin w/ Vitamins C & E (HAIR/SKIN/NAILS PO) Take 1 tablet by mouth daily.     cetirizine (ZYRTEC) 10 MG tablet Take 10 mg by mouth daily.     Cholecalciferol (VITAMIN D3) 25 MCG (1000 UT) CAPS Take 1,000 Units by mouth in the morning.     ENTRESTO 49-51 MG TAKE 1 TABLET BY MOUTH TWICE A DAY 180 tablet 0   fluticasone (FLONASE) 50 MCG/ACT nasal spray Place 2 sprays into both nostrils daily.     gabapentin (NEURONTIN) 100 MG capsule Take 1 capsule (100 mg total) by mouth 2 (two) times daily. (Patient taking differently: Take 100 mg by mouth 2 (two) times daily as needed (pain).) 30 capsule 1   GLUCOSAMINE-CHONDROITIN PO Take 1 tablet by mouth every evening.  glucose blood (ONETOUCH VERIO) test strip TEST ONCE DAILY AS DIRECTED 90     metFORMIN (GLUCOPHAGE-XR) 500 MG 24 hr tablet Take 500 mg by mouth 2 (two) times daily.     methocarbamol (ROBAXIN) 500 MG tablet Take 1 tablet (500 mg total) by mouth every 6 (six) hours as needed for muscle spasms. 20 tablet 1   metoprolol succinate (TOPROL-XL) 25 MG 24 hr tablet Take 25 mg by mouth daily.     Multiple Vitamin (MULTIVITAMIN WITH MINERALS)  TABS tablet Take 1 tablet by mouth daily. Centrum Silver for Women 50+     multivitamin-lutein (OCUVITE-LUTEIN) CAPS capsule Take 1 capsule by mouth every evening.     nitroGLYCERIN (NITROSTAT) 0.4 MG SL tablet Place 0.4 mg under the tongue every 5 (five) minutes as needed for chest pain.     Olopatadine HCl 0.2 % SOLN Place 1 drop into both eyes daily.     ondansetron (ZOFRAN) 4 MG tablet Take 1 tablet (4 mg total) by mouth every 8 (eight) hours as needed for nausea or vomiting. 20 tablet 0   OneTouch Delica Lancets 01S MISC Apply topically.     OVER THE COUNTER MEDICATION Take 1 capsule by mouth daily. Four Function Brain Support     Polyethyl Glycol-Propyl Glycol (ULTRA LUBRICANT EYE DROPS OP) Place 1 drop into both eyes 3 (three) times daily as needed (for dry eyes).      PROAIR HFA 108 (90 Base) MCG/ACT inhaler Inhale 2 puffs into the lungs every 6 (six) hours as needed for wheezing or shortness of breath.      Probiotic Product (PROBIOTIC DAILY PO) Take 1 capsule by mouth in the morning.     sertraline (ZOLOFT) 50 MG tablet Take 50 mg by mouth in the morning.     solifenacin (VESICARE) 10 MG tablet Take 10 mg by mouth in the morning.     SUPER B COMPLEX/C PO Take 1 tablet by mouth in the morning.     traMADol (ULTRAM) 50 MG tablet Take 50 mg by mouth every 6 (six) hours as needed for severe pain.     traZODone (DESYREL) 50 MG tablet Take 50 mg by mouth at bedtime as needed for sleep.     No current facility-administered medications on file prior to visit.    PAST MEDICAL HISTORY: Past Medical History:  Diagnosis Date   Anemia    Anginal pain (HCC)    Anxiety    Arthritis    Lumbar spine DDD.   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   Diabetes mellitus without complication (HCC)    metformin    Dyspnea    When patient gets sick uses Pro-Air inhaler   Frequent sinus infections    GERD (gastroesophageal reflux disease)    occ   Goiter    History of cardiomegaly    History of cataract    Bilateral   History of kidney stones    08/28/2018 currently has a large kidney stone, asymptomatic at this time   History of migraine    History of uterine fibroid    History of uterine prolapse    Hyperlipidemia    Hypertension    LBBB (left bundle branch block)    Menopause 03/03/2012   Nasal turbinate hypertrophy 11/29/2016  Left inferior    Personal history of chemotherapy    Personal history of radiation therapy    Pneumonia    as a child   PONV (postoperative nausea and vomiting)    Sinusitis 2014   Sleep apnea    No CPAP   SUI (stress urinary incontinence, female)    SVD (spontaneous vaginal delivery)    x 2   Thyroid nodule     PAST SURGICAL HISTORY: Past Surgical History:  Procedure Laterality Date   ABDOMINAL HYSTERECTOMY     BILATERAL SALPINGOOPHORECTOMY Bilateral    BLADDER SUSPENSION N/A 05/16/2017   Procedure: TRANSVAGINAL TAPE (TVT) PROCEDURE;  Surgeon: Everett Graff, MD;  Location: [facility];  Service: Gynecology;  Laterality: N/A;   BREAST BIOPSY Right 2009   BREAST LUMPECTOMY Right 2009   BREAST LUMPECTOMY Left 2006   BREAST RECONSTRUCTION WITH PLACEMENT OF TISSUE EXPANDER AND FLEX HD (ACELLULAR HYDRATED DERMIS) Bilateral 09/04/2021   Procedure: BILATERAL BREAST RECONSTRUCTION WITH PLACEMENT OF TISSUE EXPANDER AND FLEX HD (ACELLULAR HYDRATED DERMIS);  Surgeon: [name], MD;  Location: [facility];  Service: Plastics;  Laterality: Bilateral;   CARDIAC CATHETERIZATION     CATARACT EXTRACTION Left 10/23/2017   CATARACT EXTRACTION Right 11/06/2017   COLONOSCOPY  10/22/2009   Normal.  Repeat 5 years.  [name]   CYSTOSCOPY N/A 05/16/2017   Procedure: CYSTOSCOPY;  Surgeon: Everett Graff, MD;  Location: [facility];  Service: Gynecology;  Laterality: N/A;    DEBRIDEMENT AND CLOSURE WOUND Bilateral 10/03/2021   Procedure: Debridement bilateral mastectomy flaps;  Surgeon: [name], MD;  Location: [facility];  Service: Plastics;  Laterality: Bilateral;  1.5 hour   EYE SURGERY     bilateral cataracts   LEFT HEART CATH AND CORONARY ANGIOGRAPHY N/A 02/09/2020   Procedure: LEFT HEART CATH AND CORONARY ANGIOGRAPHY;  Surgeon: Nigel Mormon, MD;  Location: [facility];  Service: Cardiovascular;  Laterality: N/A;   MASTECTOMY W/ SENTINEL NODE BIOPSY Right 09/04/2021   Procedure: BILATERAL MASTECTOMIES WITH RIGHT SENTINEL LYMPH NODE BIOPSY;  Surgeon: Stark Klein, MD;  Location: [facility];  Service: General;  Laterality: Right;   REMOVAL OF BILATERAL TISSUE EXPANDERS WITH PLACEMENT OF BILATERAL BREAST IMPLANTS Bilateral 10/03/2021   Procedure: REMOVAL OF BILATERAL TISSUE EXPANDERS WITH REPLACEMENT OF TISSUE EXPANDERS;  Surgeon: [name], MD;  Location: [facility];  Service: Plastics;  Laterality: Bilateral;   ROBOTIC ASSISTED TOTAL HYSTERECTOMY N/A 05/16/2017   Procedure: ROBOTIC ASSISTED TOTAL HYSTERECTOMY;  Surgeon: Delsa Bern, MD;  Location: [facility];  Service: Gynecology;  Laterality: N/A;   ROTATOR CUFF REPAIR Bilateral    SHOULDER ARTHROSCOPY WITH ROTATOR CUFF REPAIR AND SUBACROMIAL DECOMPRESSION Right 09/03/2018   Procedure: Right shoulder manipulation under anesthesia, exam under anesthesia, mini open rotator cuff repair, subacromial decompression;  Surgeon: Susa Day, MD;  Location: [facility];  Service: Orthopedics;  Laterality: Right;  90 mins   TUBAL LIGATION     WISDOM TOOTH EXTRACTION      FAMILY HISTORY: The patient's family history includes Breast cancer in her sister and sister; Cancer in her sister and sister; Cancer (age of onset: 56) in her mother; Diabetes in her father; Hypertension in her mother.   SOCIAL HISTORY:  The patient  reports that she has never smoked. She has never used smokeless tobacco. She reports that  she does not drink alcohol and does not use drugs.  Review of Systems  Cardiovascular:  Negative for chest pain, dyspnea on exertion, leg swelling, orthopnea,  palpitations, paroxysmal nocturnal dyspnea and syncope.  Respiratory:  Negative for cough and shortness of breath.    PHYSICAL EXAM: Vitals with BMI 11/27/2021 11/24/2021 11/21/2021  Height 5' 7" 5' 7" 5' 7.5"  Weight 192 lbs 13 oz 190 lbs 14 oz 187 lbs 3 oz  BMI 30.19 67.89 38.10  Systolic 175 102 585  Diastolic 64 64 74  Pulse 77 89 93    CONSTITUTIONAL: Well-developed and well-nourished. No acute distress.  SKIN: Skin is warm and dry. No rash noted. No cyanosis. No pallor. No jaundice HEAD: Normocephalic and atraumatic.  EYES: No scleral icterus MOUTH/THROAT: Moist oral membranes.  NECK: No JVD present. No thyromegaly noted. No carotid bruits  LYMPHATIC: No visible cervical adenopathy.  CHEST Normal respiratory effort. No intercostal retractions.  Bilateral mastectomies LUNGS: Clear to auscultation bilaterally.  No stridor. No wheezes. No rales.  CARDIOVASCULAR: Regular rate and rhythm, positive S1-S2, no murmurs rubs or gallops appreciated. ABDOMINAL: Soft, nontender, nondistended, positive bowel sounds in all 4 quadrants, no apparent ascites.  EXTREMITIES: No peripheral edema  HEMATOLOGIC: No significant bruising NEUROLOGIC: Oriented to person, place, and time. Nonfocal. Normal muscle tone.  PSYCHIATRIC: Normal mood and affect. Normal behavior. Cooperative  CARDIAC DATABASE: EKG: 10/17/2020: Normal sinus rhythm, 62 bpm, left bundle branch block, ST-T changes most likely secondary to LBBB.  11/27/2021: NSR, 69bpm, LBBB.  Echocardiogram: 03/29/2021:  50-55%, G1DD, moderate MR, Moderate TR, PASP 61mHG.   06/15/2021: LVEF 30-35%, severe hypokinesis of the anteroseptal and apex, moderate to severe LV dysfunction, grade 1 diastolic dysfunction, elevated LAP, global longitudinal strain -16.7%, mild to moderate Mr.    10/09/2022: 45-50%, no regional wall motion abnormalities, global longitudinal strain -13.2%, mild MR.   Stress Testing:  [facility] Achie[facility] 7 METS, normal blood pressure response to exercise, had exertional chest pain that improved/resolved during recovery, normal myocardial perfusion study no EKG abnormalities per report.  Heart Catheterization: 02/09/2020 by Dr. MJoya GaskinsPatwardhan: Right dominant system, no angiographically significant coronary artery disease.  Normal LVEDP.  LABORATORY DATA: CBC Latest Ref Rng & Units 11/24/2021 09/05/2021 08/29/2021  WBC 4.0 - 10.5 K/uL 6.2 10.2 6.3  Hemoglobin 12.0 - 15.0 g/dL 10.7(L) 11.3(L) 12.1  Hematocrit 36.0 - 46.0 % 32.8(L) 35.0(L) 39.0  Platelets 150 - 400 K/uL 353 276 291    CMP Latest Ref Rng & Units 11/24/2021 09/05/2021 08/29/2021  Glucose 70 - 99 mg/dL 85 122(H) 154(H)  BUN 8 - 23 mg/dL _0 Creatinine 0.44 - 1.00 mg/dL 0.69 0.70 0.59  Sodium 135 - 145 mmol/L 136 134(L) 137  Potassium 3.5 - 5.1 mmol/L 4.0 4.3 4.3  Chloride 98 - 111 mmol/L 103 102 103  CO2 22 - 32 mmol/L _1 Calcium 8.9 - 10.3 mg/dL 10.1 8.9 9.5  Total Protein 6.5 - 8.1 g/dL 7.9 - -  Total Bilirubin 0.3 - 1.2 mg/dL 0.3 - -  Alkaline Phos 38 - 126 U/L 82 - -  AST 15 - 41 U/L 13(L) - -  ALT 0 - 44 U/L 14 - -    Lipid Panel: Outside records 06/24/2018: Total cholesterol 176, triglycerides 104, HDL 52, LDL 103  Lipid Panel  Lab Results  Component Value Date   CHOL 158 10/13/2020   HDL 44 10/13/2020   LDLCALC 94 10/13/2020   TRIG 107 10/13/2020   CHOLHDL 6.2 10/11/2013    Lab Results  Component Value Date   HGBA1C 7.2 (H) 08/01/2021  HGBA1C 6.1 (H) 08/28/2018   HGBA1C 6.8 (H) 10/11/2013   No components found for: NTPROBNP Lab Results  Component Value Date   TSH 0.599 10/11/2013   TSH 0.401 07/02/2012    Cardiac Panel (last 3 results) No results for input(s): CKTOTAL, CKMB,  TROPONINIHS, RELINDX in the last 72 hours.  External Labs: Collected: 09/26/2021 available in Care Everywhere. Sodium 147, potassium 4.6, chloride 107, bicarb 26, BUN 9, creatinine 0.62 AST 25, ALT 39, alkaline phosphatase 88 EGFR 106 Hemoglobin A1c 7.1 TSH 0.52  Collected 02/16/2021: Total cholesterol 182, triglycerides 118, HDL 44, LDL 114, non-HDL 117   FINAL MEDICATION LIST END OF ENCOUNTER: Meds ordered this encounter  Medications   dapagliflozin propanediol (FARXIGA) 10 MG TABS tablet    Sig: Take 1 tablet (10 mg total) by mouth daily before breakfast.    Dispense:  90 tablet    Refill:  0    Current Outpatient Medications:    acetaminophen (TYLENOL) 500 MG tablet, Take 1,000 mg by mouth every 6 (six) hours as needed for moderate pain., Disp: , Rfl:    Ascorbic Acid (VITAMIN C PO), Take by mouth in the morning., Disp: , Rfl:    aspirin 81 MG EC tablet, Take 40.5 mg by mouth in the morning. Swallow whole., Disp: , Rfl:    atorvastatin (LIPITOR) 20 MG tablet, TAKE 1 TABLET BY MOUTH EVERYDAY AT BEDTIME, Disp: 90 tablet, Rfl: 1   Biotin w/ Vitamins C & E (HAIR/SKIN/NAILS PO), Take 1 tablet by mouth daily., Disp: , Rfl:    cetirizine (ZYRTEC) 10 MG tablet, Take 10 mg by mouth daily., Disp: , Rfl:    Cholecalciferol (VITAMIN D3) 25 MCG (1000 UT) CAPS, Take 1,000 Units by mouth in the morning., Disp: , Rfl:    dapagliflozin propanediol (FARXIGA) 10 MG TABS tablet, Take 1 tablet (10 mg total) by mouth daily before breakfast., Disp: 90 tablet, Rfl: 0   ENTRESTO 49-51 MG, TAKE 1 TABLET BY MOUTH TWICE A DAY, Disp: 180 tablet, Rfl: 0   fluticasone (FLONASE) 50 MCG/ACT nasal spray, Place 2 sprays into both nostrils daily., Disp: , Rfl:    gabapentin (NEURONTIN) 100 MG capsule, Take 1 capsule (100 mg total) by mouth 2 (two) times daily. (Patient taking differently: Take 100 mg by mouth 2 (two) times daily as needed (pain).), Disp: 30 capsule, Rfl: 1   GLUCOSAMINE-CHONDROITIN PO, Take 1  tablet by mouth every evening. , Disp: , Rfl:    glucose blood (ONETOUCH VERIO) test strip, TEST ONCE DAILY AS DIRECTED 90, Disp: , Rfl:    metFORMIN (GLUCOPHAGE-XR) 500 MG 24 hr tablet, Take 500 mg by mouth 2 (two) times daily., Disp: , Rfl:    methocarbamol (ROBAXIN) 500 MG tablet, Take 1 tablet (500 mg total) by mouth every 6 (six) hours as needed for muscle spasms., Disp: 20 tablet, Rfl: 1   metoprolol succinate (TOPROL-XL) 25 MG 24 hr tablet, Take 25 mg by mouth daily., Disp: , Rfl:    Multiple Vitamin (MULTIVITAMIN WITH MINERALS) TABS tablet, Take 1 tablet by mouth daily. Centrum Silver for Women 50+, Disp: , Rfl:    multivitamin-lutein (OCUVITE-LUTEIN) CAPS capsule, Take 1 capsule by mouth every evening., Disp: , Rfl:    nitroGLYCERIN (NITROSTAT) 0.4 MG SL tablet, Place 0.4 mg under the tongue every 5 (five) minutes as needed for chest pain., Disp: , Rfl:    Olopatadine HCl 0.2 % SOLN, Place 1 drop into both eyes daily., Disp: , Rfl:    ondansetron (  ZOFRAN) 4 MG tablet, Take 1 tablet (4 mg total) by mouth every 8 (eight) hours as needed for nausea or vomiting., Disp: 20 tablet, Rfl: 0   OneTouch Delica Lancets 02D MISC, Apply topically., Disp: , Rfl:    OVER THE COUNTER MEDICATION, Take 1 capsule by mouth daily. Four Function Brain Support, Disp: , Rfl:    Polyethyl Glycol-Propyl Glycol (ULTRA LUBRICANT EYE DROPS OP), Place 1 drop into both eyes 3 (three) times daily as needed (for dry eyes). , Disp: , Rfl:    PROAIR HFA 108 (90 Base) MCG/ACT inhaler, Inhale 2 puffs into the lungs every 6 (six) hours as needed for wheezing or shortness of breath. , Disp: , Rfl:    Probiotic Product (PROBIOTIC DAILY PO), Take 1 capsule by mouth in the morning., Disp: , Rfl:    sertraline (ZOLOFT) 50 MG tablet, Take 50 mg by mouth in the morning., Disp: , Rfl:    solifenacin (VESICARE) 10 MG tablet, Take 10 mg by mouth in the morning., Disp: , Rfl:    SUPER B COMPLEX/C PO, Take 1 tablet by mouth in the  morning., Disp: , Rfl:    traMADol (ULTRAM) 50 MG tablet, Take 50 mg by mouth every 6 (six) hours as needed for severe pain., Disp: , Rfl:    traZODone (DESYREL) 50 MG tablet, Take 50 mg by mouth at bedtime as needed for sleep., Disp: , Rfl:   IMPRESSION:    ICD-10-CM   1. NICM (nonischemic cardiomyopathy) ([place])  I42.8 EKG 12-Lead    dapagliflozin propanediol (FARXIGA) 10 MG TABS tablet    Pro b natriuretic peptide (BNP)    Basic metabolic panel    Magnesium    2. LBBB (left bundle branch block)  I44.7     3. Essential hypertension  I10     4. Non-insulin dependent type 2 diabetes mellitus ([place])  E11.9     5. Mixed hyperlipidemia  E78.2     6. Malignant neoplasm of upper-outer quadrant of left breast in female, estrogen receptor negative ([place])  C50.412    Z17.1        RECOMMENDATIONS: Jordan Willis is a 71 y.o. female whose past medical history and cardiovascular risk factors include: Nonischemic cardiomyopathy, left bundle branch block, type 2 diabetes mellitus, hyperlipidemia, postmenopausal female, advanced age.  NICM (nonischemic cardiomyopathy) ([place]) Currently euvolemic. No hospitalizations for congestive heart failure. Echocardiogram 09/2021: LVEF 45-50%, average global longitudinal strain -13.2% (outside records reviewed) Continue Entresto and Toprol-XL. We will start Farxiga 10 mg p.o. daily. Patient has been provided a weeks worth of samples for Farxiga with labs to follow.  If the labs remain stable she will be asked to fill the prescription and continue the medications going forward. Would like to see her back in 1 month to further uptitrate GDMT as hemodynamics and laboratory values allow.  Consider addition of spironolactone or up titration of Entresto. We emphasized the importance of low-salt diet, strict I's and O's and daily weights.  LBBB (left bundle branch block) Chronic and stable. Asymptomatic. Continue to monitor  Essential hypertension Within  acceptable range. Uptitration of medical therapy as discussed above. We will continue to monitor  Non-insulin dependent type 2 diabetes mellitus (HCC) Hemoglobin A1c 7.1. Outside labs independently reviewed and noted above for further reference. Medications reconciled. Monitor for now  Mixed hyperlipidemia Currently on atorvastatin 20 mg p.o. nightly. Recommend goal LDL of less than 70 mg/dL. Last lipid profile from April 2022 independently reviewed. Patient states that  she is due for additional labs by her other providers and will bring in the results of the next visit.  Malignant neoplasm of upper-outer quadrant of left breast in female, estrogen receptor negative [facility]) Status post bilateral mastectomies November 2022 Started chemotherapy January 2023  --Continue cardiac medications as reconciled in final medication list. --Return in about 4 weeks (around 12/25/2021) for Follow up, heart failure management.. Or sooner if needed. --Continue follow-up with your primary care physician regarding the management of your other chronic comorbid conditions.  Patient's questions and concerns were addressed to her satisfaction. She voices understanding of the instructions provided during this encounter.   This note was created using a voice recognition software as a result there may be grammatical errors inadvertently enclosed that do not reflect the nature of this encounter. Every attempt is made to correct such errors.  Rex Kras, [place], [facility]  Pager: [phone] Office: [phone]

## 2021-11-28 ENCOUNTER — Telehealth: Payer: Self-pay

## 2021-11-28 NOTE — Telephone Encounter (Signed)
Patient's daughter called to state her mom has continued pain since 11/23/2021. The area appears swollen. She has taken tylenol and ibuprofen with no significant relief. Please call Anikka at: [phone]

## 2021-11-28 NOTE — Telephone Encounter (Signed)
Called patient back and scheduled follow up for tomorrow, 11/29/2021.

## 2021-11-29 ENCOUNTER — Other Ambulatory Visit: Payer: Self-pay

## 2021-11-29 ENCOUNTER — Encounter: Payer: Self-pay | Admitting: Plastic Surgery

## 2021-11-29 ENCOUNTER — Ambulatory Visit (INDEPENDENT_AMBULATORY_CARE_PROVIDER_SITE_OTHER): Payer: Medicare Other | Admitting: Plastic Surgery

## 2021-11-29 DIAGNOSIS — Z9889 Other specified postprocedural states: Secondary | ICD-10-CM

## 2021-11-29 NOTE — Progress Notes (Signed)
Patient presents for continued expansion.  Similar to last visit she feels like the left side has swelled up a bit more.  She does not have any fevers and has had no drainage from her incisions.  On examination she does have fluid around the left expander.  We again discussed the challenges that we have had regarding her wound healing and so forth but so far we have been able to continue to make progress with the expansion and will continue to do that today.  50 cc was infiltrated on the right side and she tolerated that fine.  On the left side 50 cc was infiltrated into the expander and 50 cc was aspirated in the form of a seroma.  She currently has around 300 cc of volume in each expander.  We will plan to see her next week and continue the process.

## 2021-11-30 ENCOUNTER — Other Ambulatory Visit: Payer: Self-pay

## 2021-11-30 DIAGNOSIS — I428 Other cardiomyopathies: Secondary | ICD-10-CM

## 2021-12-01 ENCOUNTER — Encounter: Payer: Self-pay | Admitting: *Deleted

## 2021-12-01 ENCOUNTER — Other Ambulatory Visit: Payer: Self-pay | Admitting: Cardiology

## 2021-12-01 DIAGNOSIS — I428 Other cardiomyopathies: Secondary | ICD-10-CM

## 2021-12-01 DIAGNOSIS — I10 Essential (primary) hypertension: Secondary | ICD-10-CM

## 2021-12-07 ENCOUNTER — Ambulatory Visit (INDEPENDENT_AMBULATORY_CARE_PROVIDER_SITE_OTHER): Payer: Medicare Other | Admitting: Plastic Surgery

## 2021-12-07 ENCOUNTER — Other Ambulatory Visit: Payer: Self-pay

## 2021-12-07 ENCOUNTER — Ambulatory Visit: Payer: Medicare Other | Admitting: Plastic Surgery

## 2021-12-07 DIAGNOSIS — Z9889 Other specified postprocedural states: Secondary | ICD-10-CM

## 2021-12-07 DIAGNOSIS — I428 Other cardiomyopathies: Secondary | ICD-10-CM | POA: Diagnosis not present

## 2021-12-07 NOTE — Progress Notes (Signed)
Patient presents for continued expansion.  She feels that over the past week or so there has not been much reaccumulation of fluid in the left side as she had been seen before.  No fever and no drainage from the incisions.  On exam she looks more symmetric than she has in the past and there appears to be less fluid on the left side than there has been previously.  I was able to fill 50 cc in the right tissue expander with no issue.  I also filled 50 cc in the left tissue expander and also was able to aspirate 50 cc of seroma from the left side.  She currently has approximately 350 cc of volume in each expander.  She feels like she would like to stop expansion at this point and would like to proceed with exchange for implants.  I have reiterated to her that she is a bit tenuous in terms of her outcome given the recurrent seromas on the left side and I would place a drain at the time of the switch to try to ensure that fluid does not reaccumulate around the implant implant.  We reviewed risks that include bleeding, infection, damage to surrounding structures need for additional procedures.  We discussed risks that include wound healing and infectious complications that might result in loss of the implants.  She is fully understanding and interested in moving forward.

## 2021-12-08 ENCOUNTER — Encounter: Payer: Self-pay | Admitting: Hematology and Oncology

## 2021-12-08 LAB — MAGNESIUM: Magnesium: 2.1 mg/dL (ref 1.6–2.3)

## 2021-12-08 LAB — BASIC METABOLIC PANEL
BUN/Creatinine Ratio: 34 — ABNORMAL HIGH (ref 12–28)
BUN: 17 mg/dL (ref 8–27)
CO2: 25 mmol/L (ref 20–29)
Calcium: 9.7 mg/dL (ref 8.7–10.3)
Chloride: 102 mmol/L (ref 96–106)
Creatinine, Ser: 0.5 mg/dL — ABNORMAL LOW (ref 0.57–1.00)
Glucose: 107 mg/dL — ABNORMAL HIGH (ref 70–99)
Potassium: 4.4 mmol/L (ref 3.5–5.2)
Sodium: 140 mmol/L (ref 134–144)
eGFR: 101 mL/min/{1.73_m2} (ref >59)

## 2021-12-08 LAB — PRO B NATRIURETIC PEPTIDE: NT-Pro BNP: 111 pg/mL (ref 0–301)

## 2021-12-08 NOTE — Progress Notes (Signed)
Patient is aware 

## 2021-12-12 ENCOUNTER — Other Ambulatory Visit: Payer: Self-pay

## 2021-12-12 ENCOUNTER — Inpatient Hospital Stay: Payer: Medicare Other

## 2021-12-12 VITALS — BP 116/53 | HR 76 | Temp 98.1°F | Resp 17 | Wt 192.8 lb

## 2021-12-12 DIAGNOSIS — R5383 Other fatigue: Secondary | ICD-10-CM | POA: Diagnosis not present

## 2021-12-12 DIAGNOSIS — Z171 Estrogen receptor negative status [ER-]: Secondary | ICD-10-CM | POA: Diagnosis not present

## 2021-12-12 DIAGNOSIS — C50411 Malignant neoplasm of upper-outer quadrant of right female breast: Secondary | ICD-10-CM

## 2021-12-12 DIAGNOSIS — Z170 Estrogen receptor positive status [ER+]: Secondary | ICD-10-CM

## 2021-12-12 DIAGNOSIS — Z9013 Acquired absence of bilateral breasts and nipples: Secondary | ICD-10-CM | POA: Diagnosis not present

## 2021-12-12 DIAGNOSIS — Z5112 Encounter for antineoplastic immunotherapy: Secondary | ICD-10-CM | POA: Diagnosis not present

## 2021-12-12 DIAGNOSIS — C50412 Malignant neoplasm of upper-outer quadrant of left female breast: Secondary | ICD-10-CM | POA: Diagnosis not present

## 2021-12-12 DIAGNOSIS — Z803 Family history of malignant neoplasm of breast: Secondary | ICD-10-CM | POA: Diagnosis not present

## 2021-12-12 DIAGNOSIS — R519 Headache, unspecified: Secondary | ICD-10-CM | POA: Diagnosis not present

## 2021-12-12 MED ORDER — DIPHENHYDRAMINE HCL 25 MG PO CAPS
25.0000 mg | ORAL_CAPSULE | Freq: Once | ORAL | Status: AC
  Administered 2021-12-12: 25 mg via ORAL
  Filled 2021-12-12: qty 1

## 2021-12-12 MED ORDER — ACETAMINOPHEN 325 MG PO TABS
650.0000 mg | ORAL_TABLET | Freq: Once | ORAL | Status: AC
  Administered 2021-12-12: 650 mg via ORAL
  Filled 2021-12-12: qty 2

## 2021-12-12 MED ORDER — TRASTUZUMAB-HYALURONIDASE-OYSK 600-10000 MG-UNT/5ML ~~LOC~~ SOLN
600.0000 mg | Freq: Once | SUBCUTANEOUS | Status: AC
  Administered 2021-12-12: 600 mg via SUBCUTANEOUS
  Filled 2021-12-12: qty 5

## 2021-12-12 NOTE — Addendum Note (Signed)
Addended by: Neysa Hotter on: 12/12/2021 04:52 PM   Modules accepted: Orders

## 2021-12-12 NOTE — Patient Instructions (Signed)
[facility]   Discharge Instructions: Thank you for choosing [facility] to provide your oncology and hematology care.   If you have a lab appointment with the [facility], please go directly to the [facility] and check in at the registration area.   Wear comfortable clothing and clothing appropriate for easy access to any Portacath or PICC line.   We strive to give you quality time with your provider. You may need to reschedule your appointment if you arrive late (15 or more minutes).  Arriving late affects you and other patients whose appointments are after yours.  Also, if you miss three or more appointments without notifying the office, you may be dismissed from the clinic at the providers discretion.      For prescription refill requests, have your pharmacy contact our office and allow 72 hours for refills to be completed.    Today you received the following chemotherapy and/or immunotherapy agents: trastuzumab-hyaluronidase-oysk      To help prevent nausea and vomiting after your treatment, we encourage you to take your nausea medication as directed.  BELOW ARE SYMPTOMS THAT SHOULD BE REPORTED IMMEDIATELY: *FEVER GREATER THAN 100.4 F (38 C) OR HIGHER *CHILLS OR SWEATING *NAUSEA AND VOMITING THAT IS NOT CONTROLLED WITH YOUR NAUSEA MEDICATION *UNUSUAL SHORTNESS OF BREATH *UNUSUAL BRUISING OR BLEEDING *URINARY PROBLEMS (pain or burning when urinating, or frequent urination) *BOWEL PROBLEMS (unusual diarrhea, constipation, pain near the anus) TENDERNESS IN MOUTH AND THROAT WITH OR WITHOUT PRESENCE OF ULCERS (sore throat, sores in mouth, or a toothache) UNUSUAL RASH, SWELLING OR PAIN  UNUSUAL VAGINAL DISCHARGE OR ITCHING   Items with * indicate a potential emergency and should be followed up as soon as possible or go to the Emergency Department if any problems should occur.  Please show the CHEMOTHERAPY ALERT CARD or IMMUNOTHERAPY  ALERT CARD at check-in to the Emergency Department and triage nurse.  Should you have questions after your visit or need to cancel or reschedule your appointment, please contact [facility]  Dept: [phone]  and follow the prompts.  Office hours are 8:00 a.m. to 4:30 p.m. Monday - Friday. Please note that voicemails left after 4:00 p.m. may not be returned until the following business day.  We are closed weekends and major holidays. You have access to a nurse at all times for urgent questions. Please call the main number to the clinic Dept: [phone] and follow the prompts.   For any non-urgent questions, you may also contact your provider using MyChart. We now offer e-Visits for anyone 73 and older to request care online for non-urgent symptoms. For details visit [email or web address].   Also download the MyChart app! Go to the app store, search "MyChart", open the app, select [facility], and log in with your MyChart username and password.  Due to Covid, a mask is required upon entering the hospital/clinic. If you do not have a mask, one will be given to you upon arrival. For doctor visits, patients may have 1 support person aged 63 or older with them. For treatment visits, patients cannot have anyone with them due to current Covid guidelines and our immunocompromised population.   Trastuzumab; Hyaluronidase injection What is this medication? TRASTUZUMAB; HYALURONIDASE (tras TOO zoo mab / hye al ur ON i dase) is used to treat breast cancer and stomach cancer. Trastuzumab is a monoclonal antibody. Hyaluronidase is used to improve the effects of trastuzumab. This medicine may be used  for other purposes; ask your health care provider or pharmacist if you have questions. COMMON BRAND NAME(S): HERCEPTIN HYLECTA What should I tell my care team before I take this medication? They need to know if you have any of these conditions: heart disease heart failure lung or  breathing disease, like asthma an unusual or allergic reaction to trastuzumab, or other medications, foods, dyes, or preservatives pregnant or trying to get pregnant breast-feeding How should I use this medication? This medicine is for injection under the skin. It is given by a health care professional in a hospital or clinic setting. Talk to your pediatrician regarding the use of this medicine in children. This medicine is not approved for use in children. Overdosage: If you think you have taken too much of this medicine contact a poison control center or emergency room at once. NOTE: This medicine is only for you. Do not share this medicine with others. What if I miss a dose? It is important not to miss a dose. Call your doctor or health care professional if you are unable to keep an appointment. What may interact with this medication? This medicine may interact with the following medications: certain types of chemotherapy, such as daunorubicin, doxorubicin, epirubicin, and idarubicin This list may not describe all possible interactions. Give your health care provider a list of all the medicines, herbs, non-prescription drugs, or dietary supplements you use. Also tell them if you smoke, drink alcohol, or use illegal drugs. Some items may interact with your medicine. What should I watch for while using this medication? Visit your doctor for checks on your progress. Report any side effects. Continue your course of treatment even though you feel ill unless your doctor tells you to stop. Call your doctor or health care professional for advice if you get a fever, chills or sore throat, or other symptoms of a cold or flu. Do not treat yourself. Try to avoid being around people who are sick. You may experience fever, chills and shaking during your first infusion. These effects are usually mild and can be treated with other medicines. Report any side effects during the infusion to your health care  professional. Fever and chills usually do not happen with later infusions. Do not become pregnant while taking this medicine or for 7 months after stopping it. Women should inform their doctor if they wish to become pregnant or think they might be pregnant. Women of child-bearing potential will need to have a negative pregnancy test before starting this medicine. There is a potential for serious side effects to an unborn child. Talk to your health care professional or pharmacist for more information. Do not breast-feed an infant while taking this medicine or for 7 months after stopping it. What side effects may I notice from receiving this medication? Side effects that you should report to your doctor or health care professional as soon as possible: allergic reactions like skin rash, itching or hives, swelling of the face, lips, or tongue breathing problems chest pain or palpitations cough fever general ill feeling or flu-like symptoms signs of worsening heart failure like breathing problems; swelling in your legs and feet Side effects that usually do not require medical attention (report these to your doctor or health care professional if they continue or are bothersome): bone pain changes in taste diarrhea joint pain nausea/vomiting unusually weak or tired weight loss This list may not describe all possible side effects. Call your doctor for medical advice about side effects. You may report side  effects to FDA at 1-800-FDA-1088. Where should I keep my medication? This drug is given in a hospital or clinic and will not be stored at home. NOTE: This sheet is a summary. It may not cover all possible information. If you have questions about this medicine, talk to your doctor, pharmacist, or health care provider.  2022 Elsevier/Gold Standard (2018-02-24 00:00:00)

## 2021-12-13 ENCOUNTER — Encounter: Payer: Self-pay | Admitting: Surgical

## 2021-12-13 ENCOUNTER — Ambulatory Visit (INDEPENDENT_AMBULATORY_CARE_PROVIDER_SITE_OTHER): Payer: Medicare Other | Admitting: Surgical

## 2021-12-13 VITALS — BP 123/73 | Ht 67.0 in | Wt 192.2 lb

## 2021-12-13 DIAGNOSIS — D0511 Intraductal carcinoma in situ of right breast: Secondary | ICD-10-CM

## 2021-12-13 DIAGNOSIS — Z9889 Other specified postprocedural states: Secondary | ICD-10-CM

## 2021-12-13 MED ORDER — METHOCARBAMOL 500 MG PO TABS: 500.0000 mg | ORAL_TABLET | Freq: Three times a day (TID) | ORAL | 0 refills | Status: DC | PRN

## 2021-12-13 MED ORDER — CIPROFLOXACIN HCL 500 MG PO TABS: 500.0000 mg | ORAL_TABLET | Freq: Two times a day (BID) | ORAL | 0 refills | Status: AC

## 2021-12-13 MED ORDER — ONDANSETRON HCL 4 MG PO TABS: 4.0000 mg | ORAL_TABLET | Freq: Three times a day (TID) | ORAL | 0 refills | Status: DC | PRN

## 2021-12-13 MED ORDER — OXYCODONE HCL 5 MG PO TABS: 5.0000 mg | ORAL_TABLET | Freq: Four times a day (QID) | ORAL | 0 refills | Status: AC | PRN

## 2021-12-13 NOTE — H&P (View-Only) (Signed)
Patient ID: Jordan Willis, female    DOB: 28-Nov-1950, 71 y.o.   MRN: [id number]  Chief Complaint  Patient presents with   Pre-op Exam      ICD-10-CM   1. S/P breast reconstruction  Z98.890     2. Ductal carcinoma in situ (DCIS) of right breast  D05.11       History of Present Illness: Jordan Willis is a 71 y.o.  female  with a history of bilateral breast reconstruction, currently with tissue expanders in place.  She presents for preoperative evaluation for upcoming procedure, exchange of bilateral breast tissue expanders for bilateral breast implants, scheduled for 12/19/2021 with [name].  She presents today with her daughter and family.  She has had postoperative nausea with anesthesia previously, but no other complications.  No history of DVT/PE.  No family history of DVT/PE.  No personal or family history of bleeding or clotting disorders.   PMH Significant for: Hyperlipidemia, diabetes mellitus with most recent A1c 7.2 4 months ago, anxiety, GERD, left bundle branch block, postoperative nausea vomiting, sleep apnea, hypertension, NICM.  Patient is currently prescribed aspirin, but reports she is not taking this.   She had an echo 10/09/2021 which showed LVEF 45 to 50%.  Recently started on Farxiga 10 mg per cardiology for NICM.  She reports she saw her cardiologist recently and was given a improved report based on her recent echocardiogram.  Patient reports she is currently undergoing chemotherapy with trastuzumab q21d.  Of note she does have a history of radiation therapy to the left breast in 2006-2007 after left breast tonsillectomy.  She also has history of right breast radiation therapy in 2009.  Past Medical History: Allergies: Allergies  Allergen Reactions   Bactrim [Sulfamethoxazole-Trimethoprim] Shortness Of Breath    Shortness of breath, difficulty breathing, headache, rash, fatigue   Aspirin Palpitations and Other (See Comments)    Heart fluttering, pt takes  40.5 mg (half of 81 mg) daily   Tape Rash    Current Medications:  Current Outpatient Medications:    acetaminophen (TYLENOL) 500 MG tablet, Take 1,000 mg by mouth every 6 (six) hours as needed for moderate pain., Disp: , Rfl:    Ascorbic Acid (VITAMIN C PO), Take by mouth in the morning., Disp: , Rfl:    aspirin 81 MG EC tablet, Take 40.5 mg by mouth in the morning. Swallow whole., Disp: , Rfl:    atorvastatin (LIPITOR) 20 MG tablet, TAKE 1 TABLET BY MOUTH EVERYDAY AT BEDTIME, Disp: 90 tablet, Rfl: 1   Biotin w/ Vitamins C & E (HAIR/SKIN/NAILS PO), Take 1 tablet by mouth daily., Disp: , Rfl:    cetirizine (ZYRTEC) 10 MG tablet, Take 10 mg by mouth daily., Disp: , Rfl:    Cholecalciferol (VITAMIN D3) 25 MCG (1000 UT) CAPS, Take 1,000 Units by mouth in the morning., Disp: , Rfl:    ciprofloxacin (CIPRO) 500 MG tablet, Take 1 tablet (500 mg total) by mouth 2 (two) times daily for 8 days., Disp: 16 tablet, Rfl: 0   dapagliflozin propanediol (FARXIGA) 10 MG TABS tablet, Take 1 tablet (10 mg total) by mouth daily before breakfast., Disp: 90 tablet, Rfl: 0   ENTRESTO 49-51 MG, TAKE 1 TABLET BY MOUTH TWICE A DAY, Disp: 180 tablet, Rfl: 0   fluticasone (FLONASE) 50 MCG/ACT nasal spray, Place 2 sprays into both nostrils daily., Disp: , Rfl:    gabapentin (NEURONTIN) 100 MG capsule, Take 1 capsule (100 mg total) by  mouth 2 (two) times daily. (Patient taking differently: Take 100 mg by mouth 2 (two) times daily as needed (pain).), Disp: 30 capsule, Rfl: 1   GLUCOSAMINE-CHONDROITIN PO, Take 1 tablet by mouth every evening. , Disp: , Rfl:    glucose blood (ONETOUCH VERIO) test strip, TEST ONCE DAILY AS DIRECTED 90, Disp: , Rfl:    metFORMIN (GLUCOPHAGE-XR) 500 MG 24 hr tablet, Take 500 mg by mouth 2 (two) times daily., Disp: , Rfl:    methocarbamol (ROBAXIN) 500 MG tablet, Take 1 tablet (500 mg total) by mouth every 8 (eight) hours as needed for muscle spasms., Disp: 20 tablet, Rfl: 0   metoprolol  succinate (TOPROL-XL) 25 MG 24 hr tablet, Take 25 mg by mouth daily., Disp: , Rfl:    Multiple Vitamin (MULTIVITAMIN WITH MINERALS) TABS tablet, Take 1 tablet by mouth daily. Centrum Silver for Women 50+, Disp: , Rfl:    multivitamin-lutein (OCUVITE-LUTEIN) CAPS capsule, Take 1 capsule by mouth every evening., Disp: , Rfl:    nitroGLYCERIN (NITROSTAT) 0.4 MG SL tablet, Place 0.4 mg under the tongue every 5 (five) minutes as needed for chest pain., Disp: , Rfl:    Olopatadine HCl 0.2 % SOLN, Place 1 drop into both eyes daily., Disp: , Rfl:    ondansetron (ZOFRAN) 4 MG tablet, Take 1 tablet (4 mg total) by mouth every 8 (eight) hours as needed for nausea or vomiting., Disp: 20 tablet, Rfl: 0   ondansetron (ZOFRAN) 4 MG tablet, Take 1 tablet (4 mg total) by mouth every 8 (eight) hours as needed for nausea or vomiting., Disp: 20 tablet, Rfl: 0   OneTouch Delica Lancets 10G MISC, Apply topically., Disp: , Rfl:    OVER THE COUNTER MEDICATION, Take 1 capsule by mouth daily. Four Function Brain Support, Disp: , Rfl:    oxyCODONE (OXY IR/ROXICODONE) 5 MG immediate release tablet, Take 1 tablet (5 mg total) by mouth every 6 (six) hours as needed for up to 5 days for severe pain., Disp: 20 tablet, Rfl: 0   Polyethyl Glycol-Propyl Glycol (ULTRA LUBRICANT EYE DROPS OP), Place 1 drop into both eyes 3 (three) times daily as needed (for dry eyes). , Disp: , Rfl:    PROAIR HFA 108 (90 Base) MCG/ACT inhaler, Inhale 2 puffs into the lungs every 6 (six) hours as needed for wheezing or shortness of breath. , Disp: , Rfl:    Probiotic Product (PROBIOTIC DAILY PO), Take 1 capsule by mouth in the morning., Disp: , Rfl:    sertraline (ZOLOFT) 50 MG tablet, Take 50 mg by mouth in the morning., Disp: , Rfl:    solifenacin (VESICARE) 10 MG tablet, Take 10 mg by mouth in the morning., Disp: , Rfl:    SUPER B COMPLEX/C PO, Take 1 tablet by mouth in the morning., Disp: , Rfl:    traMADol (ULTRAM) 50 MG tablet, Take 50 mg by mouth  every 6 (six) hours as needed for severe pain., Disp: , Rfl:    traZODone (DESYREL) 50 MG tablet, Take 50 mg by mouth at bedtime as needed for sleep., Disp: , Rfl:   Past Medical Problems: Past Medical History:  Diagnosis Date   Anemia    Anginal pain (HCC)    Anxiety    Arthritis    Lumbar spine DDD.   Breast cancer, female, left 03/03/2012   Cardiomyopathy ([facility])    Carpal tunnel syndrome    Bilateral, Mild   Cerebral atherosclerosis    Chronic sinusitis    DCIS (  ductal carcinoma in situ) of breast, right 03/03/2012   Diabetes mellitus without complication (HCC)    metformin   Dyspnea    When patient gets sick uses Pro-Air inhaler   Frequent sinus infections    GERD (gastroesophageal reflux disease)    occ   Goiter    History of cardiomegaly    History of cataract    Bilateral   History of kidney stones    08/28/2018 currently has a large kidney stone, asymptomatic at this time   History of migraine    History of uterine fibroid    History of uterine prolapse    Hyperlipidemia    Hypertension    LBBB (left bundle branch block)    Menopause 03/03/2012   Nasal turbinate hypertrophy 11/29/2016   Left inferior    Personal history of chemotherapy    Personal history of radiation therapy    Pneumonia    as a child   PONV (postoperative nausea and vomiting)    Sinusitis 2014   Sleep apnea    No CPAP   SUI (stress urinary incontinence, female)    SVD (spontaneous vaginal delivery)    x 2   Thyroid nodule     Past Surgical History: Past Surgical History:  Procedure Laterality Date   ABDOMINAL HYSTERECTOMY     BILATERAL SALPINGOOPHORECTOMY Bilateral    BLADDER SUSPENSION N/A 05/16/2017   Procedure: TRANSVAGINAL TAPE (TVT) PROCEDURE;  Surgeon: Everett Graff, MD;  Location: [facility];  Service: Gynecology;  Laterality: N/A;   BREAST BIOPSY Right 2009   BREAST LUMPECTOMY Right 2009   BREAST LUMPECTOMY Left 2006   BREAST RECONSTRUCTION WITH PLACEMENT OF TISSUE  EXPANDER AND FLEX HD (ACELLULAR HYDRATED DERMIS) Bilateral 09/04/2021   Procedure: BILATERAL BREAST RECONSTRUCTION WITH PLACEMENT OF TISSUE EXPANDER AND FLEX HD (ACELLULAR HYDRATED DERMIS);  Surgeon: [name], MD;  Location: [facility];  Service: Plastics;  Laterality: Bilateral;   CARDIAC CATHETERIZATION     CATARACT EXTRACTION Left 10/23/2017   CATARACT EXTRACTION Right 11/06/2017   COLONOSCOPY  10/22/2009   Normal.  Repeat 5 years.  [name]   CYSTOSCOPY N/A 05/16/2017   Procedure: CYSTOSCOPY;  Surgeon: Everett Graff, MD;  Location: [facility];  Service: Gynecology;  Laterality: N/A;   DEBRIDEMENT AND CLOSURE WOUND Bilateral 10/03/2021   Procedure: Debridement bilateral mastectomy flaps;  Surgeon: [name], MD;  Location: [facility];  Service: Plastics;  Laterality: Bilateral;  1.5 hour   EYE SURGERY     bilateral cataracts   LEFT HEART CATH AND CORONARY ANGIOGRAPHY N/A 02/09/2020   Procedure: LEFT HEART CATH AND CORONARY ANGIOGRAPHY;  Surgeon: Nigel Mormon, MD;  Location: [facility];  Service: Cardiovascular;  Laterality: N/A;   MASTECTOMY W/ SENTINEL NODE BIOPSY Right 09/04/2021   Procedure: BILATERAL MASTECTOMIES WITH RIGHT SENTINEL LYMPH NODE BIOPSY;  Surgeon: Stark Klein, MD;  Location: [facility];  Service: General;  Laterality: Right;   REMOVAL OF BILATERAL TISSUE EXPANDERS WITH PLACEMENT OF BILATERAL BREAST IMPLANTS Bilateral 10/03/2021   Procedure: REMOVAL OF BILATERAL TISSUE EXPANDERS WITH REPLACEMENT OF TISSUE EXPANDERS;  Surgeon: [name], MD;  Location: [facility];  Service: Plastics;  Laterality: Bilateral;   ROBOTIC ASSISTED TOTAL HYSTERECTOMY N/A 05/16/2017   Procedure: ROBOTIC ASSISTED TOTAL HYSTERECTOMY;  Surgeon: Delsa Bern, MD;  Location: [facility];  Service: Gynecology;  Laterality: N/A;   ROTATOR CUFF REPAIR Bilateral    SHOULDER ARTHROSCOPY WITH ROTATOR CUFF REPAIR AND SUBACROMIAL DECOMPRESSION Right 09/03/2018   Procedure: Right  shoulder  manipulation under anesthesia, exam under anesthesia, mini open rotator cuff repair, subacromial decompression;  Surgeon: Susa Day, MD;  Location: [facility];  Service: Orthopedics;  Laterality: Right;  90 mins   TUBAL LIGATION     WISDOM TOOTH EXTRACTION      Social History: Social History   Socioeconomic History   Marital status: Divorced    Spouse name: n/a   Number of children: 2   Years of education: 12   Highest education level: Not on file  Occupational History   Occupation: Retired    Fish farm manager: [employer]    Comment: Psychologist, forensic  Tobacco Use   Smoking status: Never   Smokeless tobacco: Never  Vaping Use   Vaping Use: Never used  Substance and Sexual Activity   Alcohol use: No   Drug use: No   Sexual activity: Not Currently    Partners: Male    Birth control/protection: Surgical, Post-menopausal    Comment: Hysterectomy  Other Topics Concern   Not on file  Social History Narrative   Marital status: divorced; dating seriously x many years.       Children:  2 children; 1 grandchild.      Employment: Personnel officer.      Lives: alone      Tobacco: none       Alcohol: none      Exercise:  Walking with sister two days per week.      Seatbelt: 100%      Guns: none      Right-handed   Caffeine: occasional coffee, hot tea or green tea a few times per week   Social Determinants of Health   Financial Resource Strain: Not on file  Food Insecurity: Not on file  Transportation Needs: Not on file  Physical Activity: Not on file  Stress: Not on file  Social Connections: Not on file  Intimate Partner Violence: Not on file    Family History: Family History  Problem Relation Age of Onset   Cancer Mother 69       breast cancer   Hypertension Mother    Diabetes Father    Cancer Sister        breast cancer   Breast cancer Sister    Cancer Sister        breast cancer   Breast cancer Sister     Review of Systems: Review of Systems   Constitutional: Negative.   Respiratory: Negative.    Cardiovascular: Negative.   Gastrointestinal: Negative.   Neurological: Negative.    Physical Exam: Vital Signs BP 123/73 (BP Location: Left Arm, Patient Position: Sitting, Cuff Size: Large)    Ht 5\' 7"  (1.702 m)    Wt 192 lb 3.2 oz (87.2 kg)    SpO2 100%    BMI 30.10 kg/m   Physical Exam  Constitutional:      General: Not in acute distress.    Appearance: Normal appearance. Not ill-appearing.  HENT:     Head: Normocephalic and atraumatic.  Eyes:     Pupils: Pupils are equal, round Neck:     Musculoskeletal: Normal range of motion.  Cardiovascular:     Rate and Rhythm: Normal rate    Pulses: Normal pulses.  Pulmonary:     Effort: Pulmonary effort is normal. No respiratory distress.  Musculoskeletal: Normal range of motion.  Skin:    General: Skin is warm and dry.     Findings: No erythema or rash.  Neurological:  General: No focal deficit present.     Mental Status: Alert and oriented to person, place, and time. Mental status is at baseline.     Motor: No weakness.  Psychiatric:        Mood and Affect: Mood normal.        Behavior: Behavior normal.    Assessment/Plan: The patient is scheduled for exchange of bilateral breast tissue expanders for bilateral silicone breast implant with [name].  Risks, benefits, and alternatives of procedure discussed, questions answered and consent obtained.    Smoking Status: Non-smoker; Counseling Given?  N/A  Caprini Score: 8, high; Risk Factors include: Age, BMI greater than 25, currently undergoing chemotherapy, history of breast cancer and length of planned surgery. Recommendation for mechanical and possible chemoprophylaxis. Encourage early ambulation.   Post-op Rx sent to pharmacy:  Oxycodone, Zofran, Cipro , Robaxin  Patient was provided with the General Surgical Risk consent document and Pain Medication Agreement prior to their appointment.  They had adequate time to  read through the risk consent documents and Pain Medication Agreement. We also discussed them in person together during this preop appointment. All of their questions were answered to their satisfaction.  Recommended calling if they have any further questions.  Risk consent form and Pain Medication Agreement to be scanned into patient's chart.  Patient was provided with the [facility] implant patient decision checklist and this was completed during today's preoperative evaluation. Patient had time to read through the information and any questions were answered to their content. Form will be scanned into patient's chart.  The risks that can be encountered with and after placement of a breast implant were discussed and include the following but not limited to these: bleeding, infection, delayed healing, anesthesia risks, skin sensation changes, injury to structures including nerves, blood vessels, and muscles which may be temporary or permanent, allergies to tape, suture materials and glues, blood products, topical preparations or injected agents, skin contour irregularities, skin discoloration and swelling, deep vein thrombosis, cardiac and pulmonary complications, pain, which may persist, fluid accumulation, wrinkling of the skin over the implanmt, changes in nipple or breast sensation, implant leakage or rupture, faulty position of the implant, persistent pain, formation of tight scar tissue around the implant (capsular contracture).  Patient is not currently taking her aspirin, reports she has not taken it for a few months.  She reports that she is aware that she is supposed to be taking it per her cardiologist.  Encouraged patient to restart 2 days after surgery.  Electronically signed by: Carola Rhine Martese Vanatta, PA-C 12/13/2021 1:48 PM

## 2021-12-13 NOTE — Progress Notes (Signed)
Patient ID: Jordan Willis, female    DOB: 11-25-1950, 71 y.o.   MRN: [id number]  Chief Complaint  Patient presents with   Pre-op Exam      ICD-10-CM   1. S/P breast reconstruction  Z98.890     2. Ductal carcinoma in situ (DCIS) of right breast  D05.11       History of Present Illness: BRAIDEN Willis is a 71 y.o.  female  with a history of bilateral breast reconstruction, currently with tissue expanders in place.  She presents for preoperative evaluation for upcoming procedure, exchange of bilateral breast tissue expanders for bilateral breast implants, scheduled for 12/19/2021 with [name].  She presents today with her daughter and family.  She has had postoperative nausea with anesthesia previously, but no other complications.  No history of DVT/PE.  No family history of DVT/PE.  No personal or family history of bleeding or clotting disorders.   PMH Significant for: Hyperlipidemia, diabetes mellitus with most recent A1c 7.2 4 months ago, anxiety, GERD, left bundle branch block, postoperative nausea vomiting, sleep apnea, hypertension, NICM.  Patient is currently prescribed aspirin, but reports she is not taking this.   She had an echo 10/09/2021 which showed LVEF 45 to 50%.  Recently started on Farxiga 10 mg per cardiology for NICM.  She reports she saw her cardiologist recently and was given a improved report based on her recent echocardiogram.  Patient reports she is currently undergoing chemotherapy with trastuzumab q21d.  Of note she does have a history of radiation therapy to the left breast in 2006-2007 after left breast tonsillectomy.  She also has history of right breast radiation therapy in 2009.  Past Medical History: Allergies: Allergies  Allergen Reactions   Bactrim [Sulfamethoxazole-Trimethoprim] Shortness Of Breath    Shortness of breath, difficulty breathing, headache, rash, fatigue   Aspirin Palpitations and Other (See Comments)    Heart fluttering, pt takes  40.5 mg (half of 81 mg) daily   Tape Rash    Current Medications:  Current Outpatient Medications:    acetaminophen (TYLENOL) 500 MG tablet, Take 1,000 mg by mouth every 6 (six) hours as needed for moderate pain., Disp: , Rfl:    Ascorbic Acid (VITAMIN C PO), Take by mouth in the morning., Disp: , Rfl:    aspirin 81 MG EC tablet, Take 40.5 mg by mouth in the morning. Swallow whole., Disp: , Rfl:    atorvastatin (LIPITOR) 20 MG tablet, TAKE 1 TABLET BY MOUTH EVERYDAY AT BEDTIME, Disp: 90 tablet, Rfl: 1   Biotin w/ Vitamins C & E (HAIR/SKIN/NAILS PO), Take 1 tablet by mouth daily., Disp: , Rfl:    cetirizine (ZYRTEC) 10 MG tablet, Take 10 mg by mouth daily., Disp: , Rfl:    Cholecalciferol (VITAMIN D3) 25 MCG (1000 UT) CAPS, Take 1,000 Units by mouth in the morning., Disp: , Rfl:    ciprofloxacin (CIPRO) 500 MG tablet, Take 1 tablet (500 mg total) by mouth 2 (two) times daily for 8 days., Disp: 16 tablet, Rfl: 0   dapagliflozin propanediol (FARXIGA) 10 MG TABS tablet, Take 1 tablet (10 mg total) by mouth daily before breakfast., Disp: 90 tablet, Rfl: 0   ENTRESTO 49-51 MG, TAKE 1 TABLET BY MOUTH TWICE A DAY, Disp: 180 tablet, Rfl: 0   fluticasone (FLONASE) 50 MCG/ACT nasal spray, Place 2 sprays into both nostrils daily., Disp: , Rfl:    gabapentin (NEURONTIN) 100 MG capsule, Take 1 capsule (100 mg total) by  mouth 2 (two) times daily. (Patient taking differently: Take 100 mg by mouth 2 (two) times daily as needed (pain).), Disp: 30 capsule, Rfl: 1   GLUCOSAMINE-CHONDROITIN PO, Take 1 tablet by mouth every evening. , Disp: , Rfl:    glucose blood (ONETOUCH VERIO) test strip, TEST ONCE DAILY AS DIRECTED 90, Disp: , Rfl:    metFORMIN (GLUCOPHAGE-XR) 500 MG 24 hr tablet, Take 500 mg by mouth 2 (two) times daily., Disp: , Rfl:    methocarbamol (ROBAXIN) 500 MG tablet, Take 1 tablet (500 mg total) by mouth every 8 (eight) hours as needed for muscle spasms., Disp: 20 tablet, Rfl: 0   metoprolol  succinate (TOPROL-XL) 25 MG 24 hr tablet, Take 25 mg by mouth daily., Disp: , Rfl:    Multiple Vitamin (MULTIVITAMIN WITH MINERALS) TABS tablet, Take 1 tablet by mouth daily. Centrum Silver for Women 50+, Disp: , Rfl:    multivitamin-lutein (OCUVITE-LUTEIN) CAPS capsule, Take 1 capsule by mouth every evening., Disp: , Rfl:    nitroGLYCERIN (NITROSTAT) 0.4 MG SL tablet, Place 0.4 mg under the tongue every 5 (five) minutes as needed for chest pain., Disp: , Rfl:    Olopatadine HCl 0.2 % SOLN, Place 1 drop into both eyes daily., Disp: , Rfl:    ondansetron (ZOFRAN) 4 MG tablet, Take 1 tablet (4 mg total) by mouth every 8 (eight) hours as needed for nausea or vomiting., Disp: 20 tablet, Rfl: 0   ondansetron (ZOFRAN) 4 MG tablet, Take 1 tablet (4 mg total) by mouth every 8 (eight) hours as needed for nausea or vomiting., Disp: 20 tablet, Rfl: 0   OneTouch Delica Lancets 09W MISC, Apply topically., Disp: , Rfl:    OVER THE COUNTER MEDICATION, Take 1 capsule by mouth daily. Four Function Brain Support, Disp: , Rfl:    oxyCODONE (OXY IR/ROXICODONE) 5 MG immediate release tablet, Take 1 tablet (5 mg total) by mouth every 6 (six) hours as needed for up to 5 days for severe pain., Disp: 20 tablet, Rfl: 0   Polyethyl Glycol-Propyl Glycol (ULTRA LUBRICANT EYE DROPS OP), Place 1 drop into both eyes 3 (three) times daily as needed (for dry eyes). , Disp: , Rfl:    PROAIR HFA 108 (90 Base) MCG/ACT inhaler, Inhale 2 puffs into the lungs every 6 (six) hours as needed for wheezing or shortness of breath. , Disp: , Rfl:    Probiotic Product (PROBIOTIC DAILY PO), Take 1 capsule by mouth in the morning., Disp: , Rfl:    sertraline (ZOLOFT) 50 MG tablet, Take 50 mg by mouth in the morning., Disp: , Rfl:    solifenacin (VESICARE) 10 MG tablet, Take 10 mg by mouth in the morning., Disp: , Rfl:    SUPER B COMPLEX/C PO, Take 1 tablet by mouth in the morning., Disp: , Rfl:    traMADol (ULTRAM) 50 MG tablet, Take 50 mg by mouth  every 6 (six) hours as needed for severe pain., Disp: , Rfl:    traZODone (DESYREL) 50 MG tablet, Take 50 mg by mouth at bedtime as needed for sleep., Disp: , Rfl:   Past Medical Problems: Past Medical History:  Diagnosis Date   Anemia    Anginal pain (HCC)    Anxiety    Arthritis    Lumbar spine DDD.   Breast cancer, female, left 03/03/2012   Cardiomyopathy ([place])    Carpal tunnel syndrome    Bilateral, Mild   Cerebral atherosclerosis    Chronic sinusitis    DCIS (  ductal carcinoma in situ) of breast, right 03/03/2012   Diabetes mellitus without complication (HCC)    metformin   Dyspnea    When patient gets sick uses Pro-Air inhaler   Frequent sinus infections    GERD (gastroesophageal reflux disease)    occ   Goiter    History of cardiomegaly    History of cataract    Bilateral   History of kidney stones    08/28/2018 currently has a large kidney stone, asymptomatic at this time   History of migraine    History of uterine fibroid    History of uterine prolapse    Hyperlipidemia    Hypertension    LBBB (left bundle branch block)    Menopause 03/03/2012   Nasal turbinate hypertrophy 11/29/2016   Left inferior    Personal history of chemotherapy    Personal history of radiation therapy    Pneumonia    as a child   PONV (postoperative nausea and vomiting)    Sinusitis 2014   Sleep apnea    No CPAP   SUI (stress urinary incontinence, female)    SVD (spontaneous vaginal delivery)    x 2   Thyroid nodule     Past Surgical History: Past Surgical History:  Procedure Laterality Date   ABDOMINAL HYSTERECTOMY     BILATERAL SALPINGOOPHORECTOMY Bilateral    BLADDER SUSPENSION N/A 05/16/2017   Procedure: TRANSVAGINAL TAPE (TVT) PROCEDURE;  Surgeon: Everett Graff, MD;  Location: [facility];  Service: Gynecology;  Laterality: N/A;   BREAST BIOPSY Right 2009   BREAST LUMPECTOMY Right 2009   BREAST LUMPECTOMY Left 2006   BREAST RECONSTRUCTION WITH PLACEMENT OF TISSUE  EXPANDER AND FLEX HD (ACELLULAR HYDRATED DERMIS) Bilateral 09/04/2021   Procedure: BILATERAL BREAST RECONSTRUCTION WITH PLACEMENT OF TISSUE EXPANDER AND FLEX HD (ACELLULAR HYDRATED DERMIS);  Surgeon: [name], MD;  Location: [facility];  Service: Plastics;  Laterality: Bilateral;   CARDIAC CATHETERIZATION     CATARACT EXTRACTION Left 10/23/2017   CATARACT EXTRACTION Right 11/06/2017   COLONOSCOPY  10/22/2009   Normal.  Repeat 5 years.  [name]   CYSTOSCOPY N/A 05/16/2017   Procedure: CYSTOSCOPY;  Surgeon: Everett Graff, MD;  Location: [facility];  Service: Gynecology;  Laterality: N/A;   DEBRIDEMENT AND CLOSURE WOUND Bilateral 10/03/2021   Procedure: Debridement bilateral mastectomy flaps;  Surgeon: [name], MD;  Location: [facility];  Service: Plastics;  Laterality: Bilateral;  1.5 hour   EYE SURGERY     bilateral cataracts   LEFT HEART CATH AND CORONARY ANGIOGRAPHY N/A 02/09/2020   Procedure: LEFT HEART CATH AND CORONARY ANGIOGRAPHY;  Surgeon: Nigel Mormon, MD;  Location: [facility];  Service: Cardiovascular;  Laterality: N/A;   MASTECTOMY W/ SENTINEL NODE BIOPSY Right 09/04/2021   Procedure: BILATERAL MASTECTOMIES WITH RIGHT SENTINEL LYMPH NODE BIOPSY;  Surgeon: Stark Klein, MD;  Location: [facility];  Service: General;  Laterality: Right;   REMOVAL OF BILATERAL TISSUE EXPANDERS WITH PLACEMENT OF BILATERAL BREAST IMPLANTS Bilateral 10/03/2021   Procedure: REMOVAL OF BILATERAL TISSUE EXPANDERS WITH REPLACEMENT OF TISSUE EXPANDERS;  Surgeon: [name], MD;  Location: [facility];  Service: Plastics;  Laterality: Bilateral;   ROBOTIC ASSISTED TOTAL HYSTERECTOMY N/A 05/16/2017   Procedure: ROBOTIC ASSISTED TOTAL HYSTERECTOMY;  Surgeon: Delsa Bern, MD;  Location: [facility];  Service: Gynecology;  Laterality: N/A;   ROTATOR CUFF REPAIR Bilateral    SHOULDER ARTHROSCOPY WITH ROTATOR CUFF REPAIR AND SUBACROMIAL DECOMPRESSION Right 09/03/2018   Procedure: Right  shoulder  manipulation under anesthesia, exam under anesthesia, mini open rotator cuff repair, subacromial decompression;  Surgeon: Susa Day, MD;  Location: [facility];  Service: Orthopedics;  Laterality: Right;  90 mins   TUBAL LIGATION     WISDOM TOOTH EXTRACTION      Social History: Social History   Socioeconomic History   Marital status: Divorced    Spouse name: n/a   Number of children: 2   Years of education: 12   Highest education level: Not on file  Occupational History   Occupation: Retired    Fish farm manager: [employer]    Comment: Psychologist, forensic  Tobacco Use   Smoking status: Never   Smokeless tobacco: Never  Vaping Use   Vaping Use: Never used  Substance and Sexual Activity   Alcohol use: No   Drug use: No   Sexual activity: Not Currently    Partners: Male    Birth control/protection: Surgical, Post-menopausal    Comment: Hysterectomy  Other Topics Concern   Not on file  Social History Narrative   Marital status: divorced; dating seriously x many years.       Children:  2 children; 1 grandchild.      Employment: Personnel officer.      Lives: alone      Tobacco: none       Alcohol: none      Exercise:  Walking with sister two days per week.      Seatbelt: 100%      Guns: none      Right-handed   Caffeine: occasional coffee, hot tea or green tea a few times per week   Social Determinants of Health   Financial Resource Strain: Not on file  Food Insecurity: Not on file  Transportation Needs: Not on file  Physical Activity: Not on file  Stress: Not on file  Social Connections: Not on file  Intimate Partner Violence: Not on file    Family History: Family History  Problem Relation Age of Onset   Cancer Mother 67       breast cancer   Hypertension Mother    Diabetes Father    Cancer Sister        breast cancer   Breast cancer Sister    Cancer Sister        breast cancer   Breast cancer Sister     Review of Systems: Review of Systems   Constitutional: Negative.   Respiratory: Negative.    Cardiovascular: Negative.   Gastrointestinal: Negative.   Neurological: Negative.    Physical Exam: Vital Signs BP 123/73 (BP Location: Left Arm, Patient Position: Sitting, Cuff Size: Large)    Ht 5\' 7"  (1.702 m)    Wt 192 lb 3.2 oz (87.2 kg)    SpO2 100%    BMI 30.10 kg/m   Physical Exam  Constitutional:      General: Not in acute distress.    Appearance: Normal appearance. Not ill-appearing.  HENT:     Head: Normocephalic and atraumatic.  Eyes:     Pupils: Pupils are equal, round Neck:     Musculoskeletal: Normal range of motion.  Cardiovascular:     Rate and Rhythm: Normal rate    Pulses: Normal pulses.  Pulmonary:     Effort: Pulmonary effort is normal. No respiratory distress.  Musculoskeletal: Normal range of motion.  Skin:    General: Skin is warm and dry.     Findings: No erythema or rash.  Neurological:  General: No focal deficit present.     Mental Status: Alert and oriented to person, place, and time. Mental status is at baseline.     Motor: No weakness.  Psychiatric:        Mood and Affect: Mood normal.        Behavior: Behavior normal.    Assessment/Plan: The patient is scheduled for exchange of bilateral breast tissue expanders for bilateral silicone breast implant with [name].  Risks, benefits, and alternatives of procedure discussed, questions answered and consent obtained.    Smoking Status: Non-smoker; Counseling Given?  N/A  Caprini Score: 8, high; Risk Factors include: Age, BMI greater than 25, currently undergoing chemotherapy, history of breast cancer and length of planned surgery. Recommendation for mechanical and possible chemoprophylaxis. Encourage early ambulation.   Post-op Rx sent to pharmacy:  Oxycodone, Zofran, Cipro , Robaxin  Patient was provided with the General Surgical Risk consent document and Pain Medication Agreement prior to their appointment.  They had adequate time to  read through the risk consent documents and Pain Medication Agreement. We also discussed them in person together during this preop appointment. All of their questions were answered to their satisfaction.  Recommended calling if they have any further questions.  Risk consent form and Pain Medication Agreement to be scanned into patient's chart.  Patient was provided with the [employer] implant patient decision checklist and this was completed during today's preoperative evaluation. Patient had time to read through the information and any questions were answered to their content. Form will be scanned into patient's chart.  The risks that can be encountered with and after placement of a breast implant were discussed and include the following but not limited to these: bleeding, infection, delayed healing, anesthesia risks, skin sensation changes, injury to structures including nerves, blood vessels, and muscles which may be temporary or permanent, allergies to tape, suture materials and glues, blood products, topical preparations or injected agents, skin contour irregularities, skin discoloration and swelling, deep vein thrombosis, cardiac and pulmonary complications, pain, which may persist, fluid accumulation, wrinkling of the skin over the implanmt, changes in nipple or breast sensation, implant leakage or rupture, faulty position of the implant, persistent pain, formation of tight scar tissue around the implant (capsular contracture).  Patient is not currently taking her aspirin, reports she has not taken it for a few months.  She reports that she is aware that she is supposed to be taking it per her cardiologist.  Encouraged patient to restart 2 days after surgery.  Electronically signed by: Carola Rhine Firmin Belisle, PA-C 12/13/2021 1:48 PM

## 2021-12-18 ENCOUNTER — Encounter (HOSPITAL_COMMUNITY): Payer: Self-pay | Admitting: Plastic Surgery

## 2021-12-18 ENCOUNTER — Other Ambulatory Visit: Payer: Self-pay

## 2021-12-18 NOTE — Anesthesia Preprocedure Evaluation (Addendum)
Anesthesia Evaluation  Patient identified by MRN, date of birth, ID band Patient awake    Reviewed: Allergy & Precautions, NPO status , Patient's Chart, lab work & pertinent test results  History of Anesthesia Complications (+) PONV and history of anesthetic complications  Airway Mallampati: II  TM Distance: >3 FB Neck ROM: Full    Dental no notable dental hx.    Pulmonary sleep apnea ,    Pulmonary exam normal        Cardiovascular hypertension, Pt. on medications and Pt. on home beta blockers +CHF   Rhythm:Regular Rate:Normal     Neuro/Psych  Headaches, Anxiety    GI/Hepatic Neg liver ROS, GERD  ,  Endo/Other  diabetes, Type 2, Oral Hypoglycemic Agents  Renal/GU negative Renal ROS     Musculoskeletal  (+) Arthritis , Osteoarthritis,  DCIS   Abdominal Normal abdominal exam  (+)   Peds  Hematology  (+) Blood dyscrasia, anemia ,   Anesthesia Other Findings   Reproductive/Obstetrics                            Anesthesia Physical Anesthesia Plan  ASA: 3  Anesthesia Plan: General   Post-op Pain Management:    Induction: Intravenous  PONV Risk Score and Plan: 4 or greater and Ondansetron, Dexamethasone, Midazolam and Treatment may vary due to age or medical condition  Airway Management Planned: Mask and Oral ETT  Additional Equipment: None  Intra-op Plan:   Post-operative Plan: Extubation in OR  Informed Consent: I have reviewed the patients History and Physical, chart, labs and discussed the procedure including the risks, benefits and alternatives for the proposed anesthesia with the patient or authorized representative who has indicated his/her understanding and acceptance.     Dental advisory given  Plan Discussed with: CRNA  Anesthesia Plan Comments: (PAT note written 12/18/2021 by Myra Gianotti, PA-C.  Echo 10/09/21: IMPRESSIONS  1. Left ventricular ejection  fraction, by estimation, is 45 to 50%. The  left ventricle has mildly decreased function. The left ventricle has no  regional wall motion abnormalities. Left ventricular diastolic parameters  were normal. The average left  ventricular global longitudinal strain is -13.2 %. The global longitudinal  strain is abnormal.  2. Right ventricular systolic function is normal. The right ventricular  size is normal. There is normal pulmonary artery systolic pressure.  3. The mitral valve is normal in structure. Mild mitral valve  regurgitation. No evidence of mitral stenosis.  4. The aortic valve is normal in structure. Aortic valve regurgitation is  not visualized. No aortic stenosis is present.  - Comparison(s): Changes from prior study are noted. Dated 06/15/21, EF was  30-35%. Strain was not performed.   Lab Results      Component                Value               Date                      WBC                      6.2                 11/24/2021                HGB  10.7 (L)            11/24/2021                HCT                      32.8 (L)            11/24/2021                MCV                      86.8                11/24/2021                PLT                      353                 11/24/2021           Lab Results      Component                Value               Date                      NA                       140                 12/07/2021                K                        4.4                 12/07/2021                CO2                      25                  12/07/2021                GLUCOSE                  107 (H)             12/07/2021                BUN                      17                  12/07/2021                CREATININE               0.50 (L)            12/07/2021                CALCIUM                  9.7                 12/07/2021  EGFR                     101                 12/07/2021                GFRNONAA                  >60                 11/24/2021          )       Anesthesia Quick Evaluation

## 2021-12-18 NOTE — Pre-Procedure Instructions (Signed)
[id number] [address], [place] [address] [place] [address] Phone: [phone] Fax: [phone]   PCP - [name], MD Cardiologist - Rex Kras, DO  EKG - 11/27/21 Stress Test - 02/14/2018 ECHO - 10/09/21 Cardiac Cath - 02/09/20  CPAP - Does not wear  Fasting Blood Sugar - 120 Checks Blood Sugar 1/day  Aspirin Instructions: "stopped taking at end of January"  ERAS Protcol - Clears until 1000  COVID TEST- N/A Ambulatory sx  Anesthesia review: Y  Patient verbally denies any shortness of breath, fever, cough and chest pain during phone call   -------------  SDW INSTRUCTIONS given:  Your procedure is scheduled on 12/19/21.  Report to [facility] Main Entrance "A" at 1030 A.M., and check in at the Admitting office.  Call this number if you have problems the morning of surgery:  [phone]   Remember:  Do not eat after midnight the night before your surgery  You may drink clear liquids until 10am the morning of your surgery.   Clear liquids allowed are: Water, Non-Citrus Juices (without pulp), Carbonated Beverages, Clear Tea, Black Coffee ONLY (NO MILK, CREAM OR POWDERED CREAMER of any kind), and Gatorade    Take these medicines the morning of surgery with A SIP OF WATER  cetirizine (ZYRTEC) fluticasone (FLONASE) metoprolol succinate (TOPROL-XL)  Olopatadine sertraline (ZOLOFT) solifenacin (VESICARE) acetaminophen (TYLENOL)-if needed gabapentin (NEURONTIN)-if needed methocarbamol (ROBAXIN)-if needed    As of today, STOP taking any Aspirin (unless otherwise instructed by your surgeon) Aleve, Naproxen, Ibuprofen, Motrin, Advil, Goody's, BC's, all herbal medications, fish oil, and all vitamins.  ** PLEASE check your blood sugar the morning of your surgery when you wake up and every 2 hours until you get to the Short Stay unit.  If your blood sugar is less than 70 mg/dL, you will  need to treat for low blood sugar: Do not take insulin. Treat a low blood sugar (less than 70 mg/dL) with  cup of clear juice (cranberry or apple), 4 glucose tablets, OR glucose gel. Recheck blood sugar in 15 minutes after treatment (to make sure it is greater than 70 mg/dL). If your blood sugar is not greater than 70 mg/dL on recheck, call [phone] for further instructions.                       Do not wear jewelry, make up, or nail polish            Do not wear lotions, powders, perfumes/colognes, or deodorant.            Do not shave 48 hours prior to surgery.  Men may shave face and neck.            Do not bring valuables to the hospital.            [facility] is not responsible for any belongings or valuables.  Do NOT Smoke (Tobacco/Vaping) 24 hours prior to your procedure If you use a CPAP at night, you may bring all equipment for your overnight stay.   Contacts, glasses, dentures or bridgework may not be worn into surgery.      For patients admitted to the hospital, discharge time will be determined by your treatment team.   Patients discharged the day of surgery will not be allowed to drive home, and someone needs to stay with them for 24 hours.    Special instructions:  [facility]- Preparing For Surgery  Before surgery, you can play an important role. Because skin is not sterile, your skin needs to be as free of germs as possible. You can reduce the number of germs on your skin by washing with CHG (chlorahexidine gluconate) Soap before surgery.  CHG is an antiseptic cleaner which kills germs and bonds with the skin to continue killing germs even after washing.    Oral Hygiene is also important to reduce your risk of infection.  Remember - BRUSH YOUR TEETH THE MORNING OF SURGERY WITH YOUR REGULAR TOOTHPASTE  Please do not use if you have an allergy to CHG or antibacterial soaps. If your skin becomes reddened/irritated stop using the CHG.  Do not shave (including legs  and underarms) for at least 48 hours prior to first CHG shower. It is OK to shave your face.  Please follow these instructions carefully.   Shower the NIGHT BEFORE SURGERY and the MORNING OF SURGERY with DIAL Soap.   Pat yourself dry with a CLEAN TOWEL.  Wear CLEAN PAJAMAS to bed the night before surgery  Place CLEAN SHEETS on your bed the night of your first shower and DO NOT SLEEP WITH PETS.   Day of Surgery: Please shower morning of surgery  Wear Clean/Comfortable clothing the morning of surgery Do not apply any deodorants/lotions.   Remember to brush your teeth WITH YOUR REGULAR TOOTHPASTE.   Questions were answered. Patient verbalized understanding of instructions.

## 2021-12-18 NOTE — Progress Notes (Signed)
Anesthesia Chart Review:[name]  Case: [id number] Date/Time: 12/19/21 1245   Procedure: REMOVAL OF BILATERAL TISSUE EXPANDERS WITH PLACEMENT OF BILATERAL BREAST IMPLANTS (Bilateral: Breast)   Anesthesia type: General   Pre-op diagnosis: Status Post Breast Reconstruction   Location: [facility] OR ROOM 05 / [place] [facility]   Surgeons: [name], MD       DISCUSSION:  Patient is a 71 year old female scheduled for the above procedure. She is s/p bilateral mastectomies with right sentinel LN biopsy by [name] and bilateral breast reconstruction with tissue expanders by [name] on 09/04/21. At 09/28/21 follow-up, she had full thickness injury to the skin of the right with a few millimeters of skin on the left that appeared compromised as well. She underwent excision bilateral mastectomy flap necrosis with removal and replacement of bilateral breast tissue expanders on 10/03/2021.   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    She was diagnosed with non-ischemic cardiomyopathy in April 2021, and EF had recovered to EF 50-55% by June 2022 but down to 30-35% by August 2022 pre-chemo echo. At that time, she was able to walk 30 minutes/day without CV symptoms and denied SOB and peripheral edema. Dr. Geralyn Flash communicated this to cardiologist. Repeat echo 10/09/21 showed LVEF up some to 45-50%.    By notes, she was felt to be  intermediate risk from a CV standpoint for her mastectomies and reconstruction surgery in November 2022.  She has since been reevaluated by Dr. Terri Skains on 11/27/21 for routine follow-up. She was euvolemic at that time.  Continue Entresto and Toprol.  Started on Farxiga.  Plan 1 month follow-up to further uptitrate GDMT as hemodynamics and laboratory values allow.  She is a same-day work-up.  Anesthesia team to evaluate on the day of surgery.    VS:  BP Readings from Last 3 Encounters:  12/13/21 123/73  12/12/21 (!) 116/53  11/27/21 140/64   Pulse Readings from Last 3 Encounters:  12/12/21 76  11/27/21 77  11/24/21 89     PROVIDERS: [name], MD is PCP  Rex Kras, DO is cardiologist Jacelyn Pi, MD is endocrinologist Nicholas Lose, MD is HEM[facility] Stark Klein, MD is general surgeon    LABS: For day of surgery as indicated. Most recent results include: Lab Results  Component Value Date   WBC 6.2 11/24/2021   HGB 10.7 (L) 11/24/2021   HCT 32.8 (L) 11/24/2021   PLT 353 11/24/2021   GLUCOSE 107 (H) 12/07/2021   ALT 14 11/24/2021   AST 13 (L) 11/24/2021   NA 140 12/07/2021   K 4.4 12/07/2021   CL 102 12/07/2021   CREATININE 0.50 (L) 12/07/2021   BUN 17 12/07/2021   CO2 25 12/07/2021   HGBA1C 7.2 (H) 08/01/2021    EKG: EKG 11/27/20: 11/27/2021: NSR, 69bpm, LBBB.  EKG 05/26/21: Normal sinus rhythm, 72 bpm, left axis, LAE, left bundle branch block.      CV: Echo 10/09/21:  IMPRESSIONS   1. Left ventricular ejection fraction, by estimation, is 45 to 50%. The  left ventricle has mildly decreased function. The left ventricle has no  regional wall motion abnormalities. Left ventricular diastolic parameters  were normal. The average left  ventricular global longitudinal strain is -13.2 %. The global longitudinal  strain is abnormal.   2. Right ventricular systolic function is normal. The right ventricular  size is normal. There is normal pulmonary artery systolic  pressure.   3. The mitral valve is normal in structure. Mild mitral valve  regurgitation. No evidence of mitral stenosis.   4. The aortic valve is normal in structure. Aortic valve regurgitation is  not visualized. No aortic stenosis is present.  - Comparison(s): Changes from prior study are noted. Dated 06/15/21, EF was  30-35%. Strain was not performed.      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   Diabetes mellitus without complication (HCC)    metformin   Dyspnea    When patient gets sick uses Pro-Air inhaler   Frequent sinus infections    GERD (gastroesophageal reflux disease)    occ   Goiter    History of cardiomegaly    History of cataract    Bilateral   History of kidney stones    08/28/2018 currently has a large kidney stone, asymptomatic at this time   History of migraine    History of uterine fibroid    History of uterine prolapse    Hyperlipidemia    Hypertension    LBBB (left bundle branch block)    Menopause 03/03/2012   Nasal turbinate hypertrophy 11/29/2016   Left inferior    Personal history of chemotherapy    Personal history of radiation therapy    Pneumonia    as a child   PONV (postoperative nausea and vomiting)    Sinusitis 2014   Sleep apnea    No CPAP   SUI (stress urinary incontinence, female)    SVD (spontaneous vaginal  delivery)    x 2   Thyroid nodule     Past Surgical History:  Procedure Laterality Date   ABDOMINAL HYSTERECTOMY     BILATERAL SALPINGOOPHORECTOMY Bilateral    BLADDER SUSPENSION N/A 05/16/2017   Procedure: TRANSVAGINAL TAPE (TVT) PROCEDURE;  Surgeon: Everett Graff, MD;  Location: [facility];  Service: Gynecology;  Laterality: N/A;   BREAST BIOPSY Right 2009   BREAST LUMPECTOMY Right 2009   BREAST LUMPECTOMY Left 2006   BREAST RECONSTRUCTION WITH PLACEMENT OF TISSUE EXPANDER AND FLEX HD (ACELLULAR HYDRATED DERMIS) Bilateral 09/04/2021   Procedure: BILATERAL BREAST RECONSTRUCTION WITH PLACEMENT OF TISSUE EXPANDER AND FLEX HD (ACELLULAR HYDRATED DERMIS);  Surgeon: [name], MD;  Location: [place];  Service: Plastics;  Laterality: Bilateral;   CARDIAC CATHETERIZATION     CATARACT EXTRACTION Left 10/23/2017   CATARACT EXTRACTION Right 11/06/2017   COLONOSCOPY  10/22/2009   Normal.  Repeat 5 years.  Nat [name]  CYSTOSCOPY N/A 05/16/2017   Procedure: CYSTOSCOPY;  Surgeon: Everett Graff, MD;  Location: [facility];  Service: Gynecology;  Laterality: N/A;   DEBRIDEMENT AND CLOSURE WOUND Bilateral 10/03/2021   Procedure: Debridement bilateral mastectomy flaps;  Surgeon: [name], MD;  Location: [place];  Service: Plastics;  Laterality: Bilateral;  1.5 hour   EYE SURGERY     bilateral cataracts   LEFT HEART CATH AND CORONARY ANGIOGRAPHY N/A 02/09/2020   Procedure: LEFT HEART CATH AND CORONARY ANGIOGRAPHY;  Surgeon: Nigel Mormon, MD;  Location: [facility];  Service: Cardiovascular;  Laterality: N/A;   MASTECTOMY W/ SENTINEL NODE BIOPSY Right 09/04/2021   Procedure: BILATERAL MASTECTOMIES WITH RIGHT SENTINEL LYMPH NODE BIOPSY;  Surgeon: Stark Klein, MD;  Location: [place];  Service: General;  Laterality: Right;   REMOVAL OF BILATERAL TISSUE EXPANDERS WITH PLACEMENT OF BILATERAL BREAST IMPLANTS Bilateral 10/03/2021   Procedure: REMOVAL OF BILATERAL TISSUE EXPANDERS WITH  REPLACEMENT OF TISSUE EXPANDERS;  Surgeon: [name], MD;  Location: [place];  Service: Plastics;  Laterality: Bilateral;   ROBOTIC ASSISTED TOTAL HYSTERECTOMY N/A 05/16/2017   Procedure: ROBOTIC ASSISTED TOTAL HYSTERECTOMY;  Surgeon: Delsa Bern, MD;  Location: [facility];  Service: Gynecology;  Laterality: N/A;   ROTATOR CUFF REPAIR Bilateral    SHOULDER ARTHROSCOPY WITH ROTATOR CUFF REPAIR AND SUBACROMIAL DECOMPRESSION Right 09/03/2018   Procedure: Right shoulder manipulation under anesthesia, exam under anesthesia, mini open rotator cuff repair, subacromial decompression;  Surgeon: Susa Day, MD;  Location: [facility];  Service: Orthopedics;  Laterality: Right;  90 mins   TUBAL LIGATION     WISDOM TOOTH EXTRACTION      MEDICATIONS: No current facility-administered medications for this encounter.    acetaminophen (TYLENOL) 500 MG tablet   Ascorbic Acid (VITAMIN C PO)   atorvastatin (LIPITOR) 20 MG tablet   Biotin w/ Vitamins C & E (HAIR/SKIN/NAILS PO)   cetirizine (ZYRTEC) 10 MG tablet   Cholecalciferol (VITAMIN D3) 25 MCG (1000 UT) CAPS   dapagliflozin propanediol (FARXIGA) 10 MG TABS tablet   ENTRESTO 49-51 MG   fluticasone (FLONASE) 50 MCG/ACT nasal spray   gabapentin (NEURONTIN) 100 MG capsule   GLUCOSAMINE-CHONDROITIN PO   metFORMIN (GLUCOPHAGE-XR) 500 MG 24 hr tablet   methocarbamol (ROBAXIN) 500 MG tablet   metoprolol succinate (TOPROL-XL) 25 MG 24 hr tablet   multivitamin-lutein (OCUVITE-LUTEIN) CAPS capsule   nitroGLYCERIN (NITROSTAT) 0.4 MG SL tablet   Olopatadine HCl 0.2 % SOLN   ondansetron (ZOFRAN) 4 MG tablet   Polyethyl Glycol-Propyl Glycol (ULTRA LUBRICANT EYE DROPS OP)   PROAIR HFA 108 (90 Base) MCG/ACT inhaler   Probiotic Product (PROBIOTIC DAILY PO)   sertraline (ZOLOFT) 50 MG tablet   solifenacin (VESICARE) 10 MG tablet   SUPER B COMPLEX/C PO   traZODone (DESYREL) 50 MG tablet   aspirin 81 MG EC tablet   ciprofloxacin (CIPRO) 500 MG tablet   glucose  blood (ONETOUCH VERIO) test strip   ibuprofen (ADVIL) 200 MG tablet   Multiple Vitamin (MULTIVITAMIN WITH MINERALS) TABS tablet   OneTouch Delica Lancets 50P MISC   oxyCODONE (OXY IR/ROXICODONE) 5 MG immediate release tablet    Myra Gianotti, PA-C Surgical Short Stay/Anesthesiology [facility] Phone [phone] [facility] Phone [phone] 12/18/2021 4:34 PM

## 2021-12-19 ENCOUNTER — Other Ambulatory Visit: Payer: Self-pay

## 2021-12-19 ENCOUNTER — Ambulatory Visit (HOSPITAL_COMMUNITY)
Admission: RE | Admit: 2021-12-19 | Discharge: 2021-12-19 | Disposition: A | Discharge status: Home / Self Care | Payer: Medicare Other | Attending: Plastic Surgery | Admitting: Plastic Surgery

## 2021-12-19 ENCOUNTER — Ambulatory Visit (HOSPITAL_COMMUNITY): Payer: Medicare Other | Admitting: Vascular Surgery

## 2021-12-19 ENCOUNTER — Encounter (HOSPITAL_COMMUNITY): Payer: Self-pay | Admitting: Plastic Surgery

## 2021-12-19 ENCOUNTER — Ambulatory Visit (HOSPITAL_BASED_OUTPATIENT_CLINIC_OR_DEPARTMENT_OTHER): Payer: Medicare Other | Admitting: Vascular Surgery

## 2021-12-19 ENCOUNTER — Encounter (HOSPITAL_COMMUNITY)
Admission: RE | Disposition: A | Discharge status: Home / Self Care | Payer: Self-pay | Source: Home / Self Care | Attending: Plastic Surgery

## 2021-12-19 DIAGNOSIS — I447 Left bundle-branch block, unspecified: Secondary | ICD-10-CM | POA: Diagnosis not present

## 2021-12-19 DIAGNOSIS — I43 Cardiomyopathy in diseases classified elsewhere: Secondary | ICD-10-CM | POA: Insufficient documentation

## 2021-12-19 DIAGNOSIS — I110 Hypertensive heart disease with heart failure: Secondary | ICD-10-CM

## 2021-12-19 DIAGNOSIS — G4730 Sleep apnea, unspecified: Secondary | ICD-10-CM | POA: Insufficient documentation

## 2021-12-19 DIAGNOSIS — Z421 Encounter for breast reconstruction following mastectomy: Secondary | ICD-10-CM | POA: Insufficient documentation

## 2021-12-19 DIAGNOSIS — Z9889 Other specified postprocedural states: Secondary | ICD-10-CM | POA: Insufficient documentation

## 2021-12-19 DIAGNOSIS — I119 Hypertensive heart disease without heart failure: Secondary | ICD-10-CM | POA: Diagnosis not present

## 2021-12-19 DIAGNOSIS — Z923 Personal history of irradiation: Secondary | ICD-10-CM | POA: Insufficient documentation

## 2021-12-19 DIAGNOSIS — D0511 Intraductal carcinoma in situ of right breast: Secondary | ICD-10-CM | POA: Diagnosis not present

## 2021-12-19 DIAGNOSIS — Z9013 Acquired absence of bilateral breasts and nipples: Secondary | ICD-10-CM | POA: Diagnosis not present

## 2021-12-19 DIAGNOSIS — Z9221 Personal history of antineoplastic chemotherapy: Secondary | ICD-10-CM | POA: Diagnosis not present

## 2021-12-19 DIAGNOSIS — E119 Type 2 diabetes mellitus without complications: Secondary | ICD-10-CM | POA: Insufficient documentation

## 2021-12-19 DIAGNOSIS — E785 Hyperlipidemia, unspecified: Secondary | ICD-10-CM | POA: Diagnosis not present

## 2021-12-19 DIAGNOSIS — C50912 Malignant neoplasm of unspecified site of left female breast: Secondary | ICD-10-CM | POA: Diagnosis not present

## 2021-12-19 DIAGNOSIS — I509 Heart failure, unspecified: Secondary | ICD-10-CM

## 2021-12-19 DIAGNOSIS — L905 Scar conditions and fibrosis of skin: Secondary | ICD-10-CM | POA: Diagnosis not present

## 2021-12-19 DIAGNOSIS — C50911 Malignant neoplasm of unspecified site of right female breast: Secondary | ICD-10-CM | POA: Diagnosis not present

## 2021-12-19 HISTORY — PX: REMOVAL OF BILATERAL TISSUE EXPANDERS WITH PLACEMENT OF BILATERAL BREAST IMPLANTS: SHX6431

## 2021-12-19 LAB — GLUCOSE, CAPILLARY
Glucose-Capillary: 106 mg/dL — ABNORMAL HIGH (ref 70–99)
Glucose-Capillary: 99 mg/dL (ref 70–99)

## 2021-12-19 SURGERY — REMOVAL, TISSUE EXPANDER, BREAST, BILATERAL, WITH BILATERAL IMPLANT IMPLANT INSERTION
Anesthesia: General | Site: Breast | Laterality: Bilateral

## 2021-12-19 MED ORDER — LIDOCAINE 2% (20 MG/ML) 5 ML SYRINGE
INTRAMUSCULAR | Status: AC
  Filled 2021-12-19: qty 5

## 2021-12-19 MED ORDER — ACETAMINOPHEN 325 MG PO TABS: 325.0000 mg | ORAL_TABLET | ORAL | Status: DC | PRN

## 2021-12-19 MED ORDER — EPHEDRINE 5 MG/ML INJ
INTRAVENOUS | Status: AC
  Filled 2021-12-19: qty 10

## 2021-12-19 MED ORDER — ROCURONIUM BROMIDE 10 MG/ML (PF) SYRINGE
PREFILLED_SYRINGE | INTRAVENOUS | Status: AC
  Filled 2021-12-19: qty 10

## 2021-12-19 MED ORDER — CIPROFLOXACIN HCL 500 MG PO TABS: 500.0000 mg | ORAL_TABLET | Freq: Two times a day (BID) | ORAL | 0 refills | Status: AC

## 2021-12-19 MED ORDER — CIPROFLOXACIN IN D5W 400 MG/200ML IV SOLN
400.0000 mg | INTRAVENOUS | Status: AC
  Administered 2021-12-19: 400 mg via INTRAVENOUS
  Filled 2021-12-19: qty 200

## 2021-12-19 MED ORDER — LACTATED RINGERS IV SOLN: INTRAVENOUS | Status: DC

## 2021-12-19 MED ORDER — DIPHENHYDRAMINE HCL 50 MG/ML IJ SOLN
INTRAMUSCULAR | Status: DC | PRN
  Administered 2021-12-19: 12.5 mg via INTRAVENOUS

## 2021-12-19 MED ORDER — DEXAMETHASONE SODIUM PHOSPHATE 10 MG/ML IJ SOLN
INTRAMUSCULAR | Status: DC | PRN
  Administered 2021-12-19: 5 mg via INTRAVENOUS

## 2021-12-19 MED ORDER — LIDOCAINE 2% (20 MG/ML) 5 ML SYRINGE
INTRAMUSCULAR | Status: DC | PRN
  Administered 2021-12-19: 100 mg via INTRAVENOUS

## 2021-12-19 MED ORDER — SODIUM CHLORIDE 0.9 % IR SOLN
Status: DC | PRN
  Administered 2021-12-19: 3000 mL

## 2021-12-19 MED ORDER — SODIUM CHLORIDE 0.9 % IR SOLN
Status: DC | PRN
  Administered 2021-12-19: 1000 mL

## 2021-12-19 MED ORDER — PROPOFOL 500 MG/50ML IV EMUL
INTRAVENOUS | Status: DC | PRN
  Administered 2021-12-19: 20 ug/kg/min via INTRAVENOUS

## 2021-12-19 MED ORDER — MEPERIDINE HCL 25 MG/ML IJ SOLN: 6.2500 mg | INTRAMUSCULAR | Status: DC | PRN

## 2021-12-19 MED ORDER — ORAL CARE MOUTH RINSE: 15.0000 mL | Freq: Once | OROMUCOSAL | Status: AC

## 2021-12-19 MED ORDER — OXYCODONE HCL 5 MG PO TABS
5.0000 mg | ORAL_TABLET | Freq: Once | ORAL | Status: AC | PRN
  Administered 2021-12-19: 5 mg via ORAL

## 2021-12-19 MED ORDER — ONDANSETRON HCL 4 MG/2ML IJ SOLN
INTRAMUSCULAR | Status: AC
  Filled 2021-12-19: qty 2

## 2021-12-19 MED ORDER — ACETAMINOPHEN 160 MG/5ML PO SOLN: 325.0000 mg | ORAL | Status: DC | PRN

## 2021-12-19 MED ORDER — DEXAMETHASONE SODIUM PHOSPHATE 10 MG/ML IJ SOLN
INTRAMUSCULAR | Status: AC
  Filled 2021-12-19: qty 1

## 2021-12-19 MED ORDER — OXYCODONE HCL 5 MG/5ML PO SOLN: 5.0000 mg | Freq: Once | ORAL | Status: AC | PRN

## 2021-12-19 MED ORDER — GENTAMICIN SULFATE 40 MG/ML IJ SOLN
INTRAMUSCULAR | Status: DC | PRN
  Administered 2021-12-19: 500 mL

## 2021-12-19 MED ORDER — PHENYLEPHRINE HCL-NACL 20-0.9 MG/250ML-% IV SOLN
INTRAVENOUS | Status: DC | PRN
  Administered 2021-12-19: 20 ug/min via INTRAVENOUS

## 2021-12-19 MED ORDER — CHLORHEXIDINE GLUCONATE 0.12 % MT SOLN
15.0000 mL | Freq: Once | OROMUCOSAL | Status: AC
  Administered 2021-12-19: 15 mL via OROMUCOSAL
  Filled 2021-12-19: qty 15

## 2021-12-19 MED ORDER — FENTANYL CITRATE (PF) 100 MCG/2ML IJ SOLN
INTRAMUSCULAR | Status: AC
  Filled 2021-12-19: qty 2

## 2021-12-19 MED ORDER — ROCURONIUM BROMIDE 10 MG/ML (PF) SYRINGE
PREFILLED_SYRINGE | INTRAVENOUS | Status: DC | PRN
  Administered 2021-12-19: 60 mg via INTRAVENOUS

## 2021-12-19 MED ORDER — OXYCODONE HCL 5 MG PO TABS
ORAL_TABLET | ORAL | Status: AC
  Filled 2021-12-19: qty 1

## 2021-12-19 MED ORDER — CIPROFLOXACIN IN D5W 400 MG/200ML IV SOLN
INTRAVENOUS | Status: AC
  Filled 2021-12-19: qty 200

## 2021-12-19 MED ORDER — FENTANYL CITRATE (PF) 250 MCG/5ML IJ SOLN
INTRAMUSCULAR | Status: AC
  Filled 2021-12-19: qty 5

## 2021-12-19 MED ORDER — CHLORHEXIDINE GLUCONATE 0.12 % MT SOLN
OROMUCOSAL | Status: AC
  Filled 2021-12-19: qty 15

## 2021-12-19 MED ORDER — PROPOFOL 10 MG/ML IV BOLUS
INTRAVENOUS | Status: DC | PRN
  Administered 2021-12-19: 130 mg via INTRAVENOUS

## 2021-12-19 MED ORDER — PHENYLEPHRINE 40 MCG/ML (10ML) SYRINGE FOR IV PUSH (FOR BLOOD PRESSURE SUPPORT)
PREFILLED_SYRINGE | INTRAVENOUS | Status: AC
  Filled 2021-12-19: qty 20

## 2021-12-19 MED ORDER — FENTANYL CITRATE (PF) 100 MCG/2ML IJ SOLN
25.0000 ug | INTRAMUSCULAR | Status: DC | PRN
  Administered 2021-12-19: 25 ug via INTRAVENOUS
  Administered 2021-12-19: 50 ug via INTRAVENOUS
  Administered 2021-12-19: 25 ug via INTRAVENOUS

## 2021-12-19 MED ORDER — FENTANYL CITRATE (PF) 250 MCG/5ML IJ SOLN
INTRAMUSCULAR | Status: DC | PRN
  Administered 2021-12-19: 50 ug via INTRAVENOUS
  Administered 2021-12-19: 100 ug via INTRAVENOUS
  Administered 2021-12-19: 50 ug via INTRAVENOUS

## 2021-12-19 MED ORDER — ONDANSETRON HCL 4 MG/2ML IJ SOLN
INTRAMUSCULAR | Status: DC | PRN
  Administered 2021-12-19: 4 mg via INTRAVENOUS

## 2021-12-19 MED ORDER — ONDANSETRON HCL 4 MG/2ML IJ SOLN: 4.0000 mg | Freq: Once | INTRAMUSCULAR | Status: DC | PRN

## 2021-12-19 MED ORDER — SUGAMMADEX SODIUM 200 MG/2ML IV SOLN
INTRAVENOUS | Status: DC | PRN
  Administered 2021-12-19: 200 mg via INTRAVENOUS

## 2021-12-19 SURGICAL SUPPLY — 63 items
BAG COUNTER SPONGE SURGICOUNT (BAG) ×2 IMPLANT
BAG DECANTER FOR FLEXI CONT (MISCELLANEOUS) ×2 IMPLANT
BINDER BREAST LRG (GAUZE/BANDAGES/DRESSINGS) IMPLANT
BINDER BREAST XLRG (GAUZE/BANDAGES/DRESSINGS) ×1 IMPLANT
BIOPATCH RED 1 DISK 7.0 (GAUZE/BANDAGES/DRESSINGS) ×2 IMPLANT
CANISTER SUCT 3000ML PPV (MISCELLANEOUS) ×2 IMPLANT
CHLORAPREP W/TINT 26 (MISCELLANEOUS) ×2 IMPLANT
CNTNR URN SCR LID CUP LEK RST (MISCELLANEOUS) IMPLANT
CONT SPEC 4OZ STRL OR WHT (MISCELLANEOUS) ×4
COVER SURGICAL LIGHT HANDLE (MISCELLANEOUS) ×2 IMPLANT
DERMABOND ADVANCED (GAUZE/BANDAGES/DRESSINGS) ×1
DERMABOND ADVANCED .7 DNX12 (GAUZE/BANDAGES/DRESSINGS) ×1 IMPLANT
DRAIN CHANNEL 19F RND (DRAIN) ×2 IMPLANT
DRAIN JP 15F RND TROCAR (DRAIN) ×1 IMPLANT
DRAPE HALF SHEET 40X57 (DRAPES) ×4 IMPLANT
DRAPE ORTHO SPLIT 77X108 STRL (DRAPES) ×4
DRAPE SURG 17X23 STRL (DRAPES) ×8 IMPLANT
DRAPE SURG ORHT 6 SPLT 77X108 (DRAPES) ×2 IMPLANT
DRAPE WARM FLUID 44X44 (DRAPES) ×2 IMPLANT
DRSG PAD ABDOMINAL 8X10 ST (GAUZE/BANDAGES/DRESSINGS) ×4 IMPLANT
ELECT BLADE 4.0 EZ CLEAN MEGAD (MISCELLANEOUS) ×2
ELECT REM PT RETURN 9FT ADLT (ELECTROSURGICAL) ×2
ELECTRODE BLDE 4.0 EZ CLN MEGD (MISCELLANEOUS) ×1 IMPLANT
ELECTRODE REM PT RTRN 9FT ADLT (ELECTROSURGICAL) ×1 IMPLANT
EVACUATOR SILICONE 100CC (DRAIN) ×3 IMPLANT
FUNNEL KELLER 2 DISP (MISCELLANEOUS) ×2 IMPLANT
GAUZE SPONGE 4X4 12PLY STRL (GAUZE/BANDAGES/DRESSINGS) ×2 IMPLANT
GLOVE SURG ENC MOIS LTX SZ6.5 (GLOVE) ×2 IMPLANT
GOWN STRL REUS W/ TWL LRG LVL3 (GOWN DISPOSABLE) ×2 IMPLANT
GOWN STRL REUS W/TWL LRG LVL3 (GOWN DISPOSABLE) ×4
HANDPIECE INTERPULSE COAX TIP (DISPOSABLE) ×2
IMPL BREAST GEL 320 (Breast) IMPLANT
IMPLANT BREAST GEL 320 (Breast) ×4 IMPLANT
KIT BASIN OR (CUSTOM PROCEDURE TRAY) ×2 IMPLANT
KIT FILL ASEPTIC TRANSFER (MISCELLANEOUS) ×1 IMPLANT
KIT TURNOVER KIT B (KITS) ×2 IMPLANT
NS IRRIG 1000ML POUR BTL (IV SOLUTION) ×4 IMPLANT
PACK GENERAL/GYN (CUSTOM PROCEDURE TRAY) ×2 IMPLANT
PAD ABD 8X10 STRL (GAUZE/BANDAGES/DRESSINGS) ×1 IMPLANT
PAD ARMBOARD 7.5X6 YLW CONV (MISCELLANEOUS) ×2 IMPLANT
PIN SAFETY STERILE (MISCELLANEOUS) ×2 IMPLANT
SET ASEPTIC TRANSFER (MISCELLANEOUS) IMPLANT
SET HNDPC FAN SPRY TIP SCT (DISPOSABLE) IMPLANT
SIZER BREAST 290 (SIZER) ×2
SIZER BREAST 320 (SIZER) ×4
SIZER BREAST GEL REUSE 340CC (SIZER) ×2
SIZER BRST 290 (SIZER) IMPLANT
SIZER BRST 320 (SIZER) IMPLANT
SIZER BRST GEL REUSE 340CC (SIZER) IMPLANT
SPONGE T-LAP 18X18 ~~LOC~~+RFID (SPONGE) ×3 IMPLANT
STAPLER INSORB 30 2030 C-SECTI (MISCELLANEOUS) ×1 IMPLANT
STAPLER VISISTAT 35W (STAPLE) ×1 IMPLANT
SUT ETHILON 3 0 PS 1 (SUTURE) ×1 IMPLANT
SUT MON AB 5-0 PS2 18 (SUTURE) ×4 IMPLANT
SUT PDS AB 2-0 CT1 27 (SUTURE) IMPLANT
SUT PDS AB 3-0 SH 27 (SUTURE) ×4 IMPLANT
SUT SILK 3 0 SH 30 (SUTURE) ×3 IMPLANT
SUT VIC AB 3-0 SH 27 (SUTURE) ×6
SUT VIC AB 3-0 SH 27X BRD (SUTURE) ×3 IMPLANT
SUT VICRYL 4-0 PS2 18IN ABS (SUTURE) ×4 IMPLANT
TOWEL GREEN STERILE (TOWEL DISPOSABLE) ×2 IMPLANT
TOWEL GREEN STERILE FF (TOWEL DISPOSABLE) ×2 IMPLANT
TRAY FOLEY MTR SLVR 14FR STAT (SET/KITS/TRAYS/PACK) IMPLANT

## 2021-12-19 NOTE — Discharge Instructions (Addendum)
Activity: Avoid strenuous activity.  No heavy lifting.  Diet: No restrictions.  Try to optimize nutrition with plenty of fruits and vegetables to improve healing.  Wound Care: Leave breast binder in place for next 72 hours.  You may then remove and shower normally.  After breast binder comes off, recommend sports bra for gentle compression.  Avoid any bra with under-wire until cleared by [name].  You have a drain on left side.  Please record daily output.  Call us if the drain malfunctions.    Follow-Up: Scheduled for next week.  Things to watch for:  Call the office if you experience fever, chills, persistent nausea, or significant bleeding.  Mild wound drainage is common after breast reduction surgery and should not be cause for alarm.  Antibiotic: We have also called you in Cipro 500 mg BID to the [place] (open 24/7) given that you required a drain. Please take as directed.

## 2021-12-19 NOTE — Op Note (Signed)
Operative Note   DATE OF OPERATION: 12/19/2021  SURGICAL DEPARTMENT: Plastic Surgery  PREOPERATIVE DIAGNOSES: Breast cancer  POSTOPERATIVE DIAGNOSES:  same  PROCEDURE: 1.  Exchange of bilateral breast tissue expanders for gel implants 2.  Bilateral breast capsulotomy  SURGEON: Talmadge Coventry, MD  ASSISTANT: Krista Blue, PA The advanced practice practitioner (APP) assisted throughout the case.  The APP was essential in retraction and counter traction when needed to make the case progress smoothly.  This retraction and assistance made it possible to see the tissue planes for the procedure.  The assistance was needed for hemostasis, tissue re-approximation and closure of the incision site.   ANESTHESIA:  General.   COMPLICATIONS: None.   INDICATIONS FOR PROCEDURE:  The patient, Jordan Willis is a 71 y.o. female born on 1950-12-24, is here for treatment of breast reconstruction MRN: [id number]  CONSENT:  Informed consent was obtained directly from the patient. Risks, benefits and alternatives were fully discussed. Specific risks including but not limited to bleeding, infection, hematoma, seroma, scarring, pain, contracture, asymmetry, wound healing problems, and need for further surgery were all discussed. The patient did have an ample opportunity to have questions answered to satisfaction.   DESCRIPTION OF PROCEDURE:  The patient was taken to the operating room. SCDs were placed and antibiotics were given.  General anesthesia was administered.  The patient's operative site was prepped and draped in a sterile fashion. A time out was performed and all information was confirmed to be correct.  Started on the right side.  I did the elliptical excision just for a few millimeters around the entirety of her previous incision.  This would serve to freshen up the wound edges.  The excised skin was sent to pathology.  The underlying fat appeared relatively avascular.  Expander was then removed.   The pocket was irrigated with pulse lavage using Betadine irrigation.  A 320 cc sizer was placed and some superior medial capsulotomies were performed to give a better shape.  The skin was stapled closed temporarily.  I then turned my attention to the left side.  Similarly the incision was excised in elliptical fashion and sent to pathology.  The expander was removed.  Pocket was irrigated with pulse lavage with Betadine irrigation.  There was no notable fluid in the left pocket.  An inferior and superior medial capsulotomy was performed to expand the pocket.  81 French drain was placed due to her previous history of seromas on that side.  Was secured with nylon suture.  A 320 cc sizer was placed and the skin was stapled closed.  This gave reasonable shape size and symmetry match.  The sizers were then removed.  Both pockets were irrigated with triple antibiotic solution.  I changed my gloves.  The implants were brought onto the field.  I elected to use Mentor smooth round moderate profile boost implants with 320 cc of volume.  Serial number for the right side [id number].  Serial number for the left side [id number].  These were placed with a Keller funnel.  Closure was done with interrupted buried 3-0 PDS sutures and a running 3-0 PDS.  Soft compressive dressing was applied.  The patient tolerated the procedure well.  There were no complications. The patient was allowed to wake from anesthesia, extubated and taken to the recovery room in satisfactory condition.

## 2021-12-19 NOTE — Interval H&P Note (Signed)
Patient seen and examined. Risks and benefits discussed. Proceed with surgery.

## 2021-12-19 NOTE — Anesthesia Postprocedure Evaluation (Signed)
Anesthesia Post Note  Patient: Jordan Willis  Procedure(s) Performed: REMOVAL OF BILATERAL TISSUE EXPANDERS WITH PLACEMENT OF BILATERAL BREAST IMPLANTS (Bilateral: Breast)     Patient location during evaluation: PACU Anesthesia Type: General Level of consciousness: awake and alert Pain management: pain level controlled Vital Signs Assessment: post-procedure vital signs reviewed and stable Respiratory status: spontaneous breathing, nonlabored ventilation, respiratory function stable and patient connected to nasal cannula oxygen Cardiovascular status: blood pressure returned to baseline and stable Postop Assessment: no apparent nausea or vomiting Anesthetic complications: no   No notable events documented.  Last Vitals:  Vitals:   12/19/21 1630 12/19/21 1645  BP: 135/68 132/72  Pulse: 76 75  Resp: 16 16  Temp:  36.6 C  SpO2: 92% 94%    Last Pain:  Vitals:   12/19/21 1645  PainSc: 3                  Raygen Dahm P Kaityln Kallstrom

## 2021-12-19 NOTE — Anesthesia Procedure Notes (Signed)
Procedure Name: Intubation Date/Time: 12/19/2021 1:25 PM Performed by: Reece Agar, CRNA Pre-anesthesia Checklist: Patient identified, Emergency Drugs available, Suction available and Patient being monitored Patient Re-evaluated:Patient Re-evaluated prior to induction Oxygen Delivery Method: Circle System Utilized Preoxygenation: Pre-oxygenation with 100% oxygen Induction Type: IV induction Ventilation: Mask ventilation without difficulty Laryngoscope Size: Mac and 3 Grade View: Grade II Tube type: Oral Tube size: 7.0 mm Number of attempts: 1 Airway Equipment and Method: Stylet Placement Confirmation: ETT inserted through vocal cords under direct vision, positive ETCO2 and breath sounds checked- equal and bilateral Secured at: 22 cm Tube secured with: Tape Dental Injury: Teeth and Oropharynx as per pre-operative assessment

## 2021-12-19 NOTE — Transfer of Care (Signed)
Immediate Anesthesia Transfer of Care Note  Patient: Jordan Willis  Procedure(s) Performed: REMOVAL OF BILATERAL TISSUE EXPANDERS WITH PLACEMENT OF BILATERAL BREAST IMPLANTS (Bilateral: Breast)  Patient Location: PACU  Anesthesia Type:General  Level of Consciousness: awake and alert   Airway & Oxygen Therapy: Patient Spontanous Breathing and Patient connected to face mask oxygen  Post-op Assessment: Report given to RN and Post -op Vital signs reviewed and stable  Post vital signs: Reviewed and stable  Last Vitals:  Vitals Value Taken Time  BP 139/61 12/19/21 1530  Temp    Pulse 121 12/19/21 1535  Resp 21 12/19/21 1536  SpO2 95 % 12/19/21 1535  Vitals shown include unvalidated device data.  Last Pain:  Vitals:   12/19/21 1122  PainSc: 0-No pain         Complications: No notable events documented.

## 2021-12-20 ENCOUNTER — Encounter (HOSPITAL_COMMUNITY): Payer: Self-pay | Admitting: Plastic Surgery

## 2021-12-20 LAB — SURGICAL PATHOLOGY

## 2021-12-25 ENCOUNTER — Encounter: Payer: Self-pay | Admitting: Cardiology

## 2021-12-25 ENCOUNTER — Other Ambulatory Visit: Payer: Self-pay

## 2021-12-25 ENCOUNTER — Ambulatory Visit: Payer: Medicaid Other | Admitting: Cardiology

## 2021-12-25 VITALS — BP 129/74 | HR 87 | Temp 97.2°F | Resp 16 | Ht 67.5 in | Wt 194.0 lb

## 2021-12-25 DIAGNOSIS — Z171 Estrogen receptor negative status [ER-]: Secondary | ICD-10-CM

## 2021-12-25 DIAGNOSIS — I428 Other cardiomyopathies: Secondary | ICD-10-CM

## 2021-12-25 DIAGNOSIS — I447 Left bundle-branch block, unspecified: Secondary | ICD-10-CM

## 2021-12-25 DIAGNOSIS — E782 Mixed hyperlipidemia: Secondary | ICD-10-CM

## 2021-12-25 DIAGNOSIS — I10 Essential (primary) hypertension: Secondary | ICD-10-CM | POA: Diagnosis not present

## 2021-12-25 DIAGNOSIS — C50412 Malignant neoplasm of upper-outer quadrant of left female breast: Secondary | ICD-10-CM

## 2021-12-25 DIAGNOSIS — E119 Type 2 diabetes mellitus without complications: Secondary | ICD-10-CM | POA: Diagnosis not present

## 2021-12-25 NOTE — Progress Notes (Signed)
[name] Date of Birth: 1950/12/29 MRN: [id number] Primary Care Provider:Pharr, Thayer Jew, MD  Former Cardiology Providers: Miquel Dunn, MSN, APRN, FNP-C Primary Cardiologist: Rex Kras, DO (established care 01/20/2020)  Date: 12/25/21 Last Office Visit: 11/27/2021  Chief Complaint  Patient presents with   Cardiomyopathy   HPI  Jordan Willis is a 71 y.o.  female who presents to the office with a chief complaint of  "cardiomyopathy management." Patient's past medical history and cardiovascular risk factors include: Left bundle branch block, type 2 diabetes mellitus, hyperlipidemia, nonischemic cardiomyopathy, recurrent breast cancer status post bilateral mastectomies, postmenopausal female, advanced age.  This patient is accompanied in the office by her  daughter, [name] . BIRIDIANA TWARDOWSKI provides verbal consent with regards to having them present during today's encounter.  Patient is being followed by the practice for management of nonischemic cardiomyopathy.  Her LVEF was 50-55% back in June 2022 with recurrence of breast cancer and the need for chemotherapy she was been undergoing serial echocardiograms and was noted to have an LVEF of 30-35% as of August 2022.  Since then her medications have been uptitrated and most recent echo from December 2023 notes an LVEF of 45-50%.  At last office visit we initiated [place] which she has tolerated well without any side effects or intolerances.  Since last office visit, she underwent plastic surgery for exchange of bilateral breast tissue expanders for gel implants.  She has a JP drain over the left surgical site.  Currently on antibiotics and recovering slowly.  She denies any heart failure symptoms or anginal discomfort.  FUNCTIONAL STATUS: No structured exercise program or daily routine.  ALLERGIES: Allergies  Allergen Reactions   Bactrim [Sulfamethoxazole-Trimethoprim] Shortness Of Breath    Shortness of breath, difficulty  breathing, headache, rash, fatigue   Aspirin Palpitations and Other (See Comments)    Heart fluttering, pt takes 40.5 mg (half of 81 mg) daily   Tape Rash    Tears skin off   MEDICATION LIST PRIOR TO VISIT: Current Outpatient Medications on File Prior to Visit  Medication Sig Dispense Refill   acetaminophen (TYLENOL) 500 MG tablet Take 1,000 mg by mouth every 6 (six) hours as needed for moderate pain.     Ascorbic Acid (VITAMIN C PO) Take by mouth in the morning.     aspirin 81 MG EC tablet Take 40.5 mg by mouth in the morning. Swallow whole.     atorvastatin (LIPITOR) 20 MG tablet TAKE 1 TABLET BY MOUTH EVERYDAY AT BEDTIME 90 tablet 1   cetirizine (ZYRTEC) 10 MG tablet Take 10 mg by mouth in the morning.     Cholecalciferol (VITAMIN D3) 25 MCG (1000 UT) CAPS Take 1,000 Units by mouth in the morning.     ciprofloxacin (CIPRO) 500 MG tablet Take 1 tablet (500 mg total) by mouth 2 (two) times daily for 14 days. 28 tablet 0   dapagliflozin propanediol (FARXIGA) 10 MG TABS tablet Take 1 tablet (10 mg total) by mouth daily before breakfast. 90 tablet 0   ENTRESTO 49-51 MG TAKE 1 TABLET BY MOUTH TWICE A DAY 180 tablet 0   fluticasone (FLONASE) 50 MCG/ACT nasal spray Place 2 sprays into both nostrils in the morning.     gabapentin (NEURONTIN) 100 MG capsule Take 1 capsule (100 mg total) by mouth 2 (two) times daily. (Patient taking differently: Take 100 mg by mouth 2 (two) times daily as needed (muscle spasms).) 30 capsule 1   GLUCOSAMINE-CHONDROITIN PO Take 1 tablet  by mouth every evening.      glucose blood (ONETOUCH VERIO) test strip TEST ONCE DAILY AS DIRECTED 90     ibuprofen (ADVIL) 200 MG tablet Take 400 mg by mouth every 6 (six) hours as needed (for pain.).     metFORMIN (GLUCOPHAGE-XR) 500 MG 24 hr tablet Take 500 mg by mouth 2 (two) times daily.     methocarbamol (ROBAXIN) 500 MG tablet Take 1 tablet (500 mg total) by mouth every 8 (eight) hours as needed for muscle spasms. 20 tablet 0    metoprolol succinate (TOPROL-XL) 25 MG 24 hr tablet Take 25 mg by mouth in the morning. Hold for low blood pressure     Multiple Vitamin (MULTIVITAMIN WITH MINERALS) TABS tablet Take 1 tablet by mouth daily. Centrum Silver for Women 50+     multivitamin-lutein (OCUVITE-LUTEIN) CAPS capsule Take 1 capsule by mouth every evening.     nitroGLYCERIN (NITROSTAT) 0.4 MG SL tablet Place 0.4 mg under the tongue every 5 (five) minutes x 3 doses as needed for chest pain.     Olopatadine HCl 0.2 % SOLN Place 1 drop into both eyes daily.     ondansetron (ZOFRAN) 4 MG tablet Take 1 tablet (4 mg total) by mouth every 8 (eight) hours as needed for nausea or vomiting. 20 tablet 0   OneTouch Delica Lancets 54D MISC Apply topically.     Polyethyl Glycol-Propyl Glycol (ULTRA LUBRICANT EYE DROPS OP) Place 1 drop into both eyes 3 (three) times daily as needed (for dry eyes).      PROAIR HFA 108 (90 Base) MCG/ACT inhaler Inhale 2 puffs into the lungs every 6 (six) hours as needed for wheezing or shortness of breath.      Probiotic Product (PROBIOTIC DAILY PO) Take 1 capsule by mouth in the morning.     sertraline (ZOLOFT) 50 MG tablet Take 50 mg by mouth in the morning.     solifenacin (VESICARE) 10 MG tablet Take 10 mg by mouth in the morning.     SUPER B COMPLEX/C PO Take 1 tablet by mouth in the morning.     traZODone (DESYREL) 50 MG tablet Take 50 mg by mouth at bedtime as needed for sleep.     No current facility-administered medications on file prior to visit.    PAST MEDICAL HISTORY: Past Medical History:  Diagnosis Date   Anemia    Anginal pain (HCC)    Anxiety    Arthritis    Lumbar spine DDD.   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   Diabetes mellitus without complication (HCC)    metformin   Dyspnea    When patient  gets sick uses Pro-Air inhaler   Frequent sinus infections    GERD (gastroesophageal reflux disease)    occ   Goiter    History of cardiomegaly    History of cataract    Bilateral   History of kidney stones    08/28/2018 currently has a large kidney stone, asymptomatic at this time   History of migraine    History of uterine fibroid    History of uterine prolapse    Hyperlipidemia    Hypertension    LBBB (left bundle branch block)    Menopause 03/03/2012   Nasal turbinate hypertrophy 11/29/2016  Left inferior    Personal history of chemotherapy    Personal history of radiation therapy    Pneumonia    as a child   PONV (postoperative nausea and vomiting)    Sinusitis 2014   Sleep apnea    No CPAP   SUI (stress urinary incontinence, female)    SVD (spontaneous vaginal delivery)    x 2   Thyroid nodule     PAST SURGICAL HISTORY: Past Surgical History:  Procedure Laterality Date   ABDOMINAL HYSTERECTOMY     BILATERAL SALPINGOOPHORECTOMY Bilateral    BLADDER SUSPENSION N/A 05/16/2017   Procedure: TRANSVAGINAL TAPE (TVT) PROCEDURE;  Surgeon: Everett Graff, MD;  Location: [facility];  Service: Gynecology;  Laterality: N/A;   BREAST BIOPSY Right 2009   BREAST LUMPECTOMY Right 2009   BREAST LUMPECTOMY Left 2006   BREAST RECONSTRUCTION WITH PLACEMENT OF TISSUE EXPANDER AND FLEX HD (ACELLULAR HYDRATED DERMIS) Bilateral 09/04/2021   Procedure: BILATERAL BREAST RECONSTRUCTION WITH PLACEMENT OF TISSUE EXPANDER AND FLEX HD (ACELLULAR HYDRATED DERMIS);  Surgeon: [name], MD;  Location: [facility];  Service: Plastics;  Laterality: Bilateral;   CARDIAC CATHETERIZATION     CATARACT EXTRACTION Left 10/23/2017   CATARACT EXTRACTION Right 11/06/2017   COLONOSCOPY  10/22/2009   Normal.  Repeat 5 years.  [name]   CYSTOSCOPY N/A 05/16/2017   Procedure: CYSTOSCOPY;  Surgeon: Everett Graff, MD;  Location: [facility];  Service: Gynecology;  Laterality: N/A;   DEBRIDEMENT AND CLOSURE WOUND  Bilateral 10/03/2021   Procedure: Debridement bilateral mastectomy flaps;  Surgeon: [name], MD;  Location: [facility];  Service: Plastics;  Laterality: Bilateral;  1.5 hour   EYE SURGERY     bilateral cataracts   LEFT HEART CATH AND CORONARY ANGIOGRAPHY N/A 02/09/2020   Procedure: LEFT HEART CATH AND CORONARY ANGIOGRAPHY;  Surgeon: Nigel Mormon, MD;  Location: [facility];  Service: Cardiovascular;  Laterality: N/A;   MASTECTOMY W/ SENTINEL NODE BIOPSY Right 09/04/2021   Procedure: BILATERAL MASTECTOMIES WITH RIGHT SENTINEL LYMPH NODE BIOPSY;  Surgeon: Stark Klein, MD;  Location: [facility];  Service: General;  Laterality: Right;   REMOVAL OF BILATERAL TISSUE EXPANDERS WITH PLACEMENT OF BILATERAL BREAST IMPLANTS Bilateral 10/03/2021   Procedure: REMOVAL OF BILATERAL TISSUE EXPANDERS WITH REPLACEMENT OF TISSUE EXPANDERS;  Surgeon: [name], MD;  Location: [facility];  Service: Plastics;  Laterality: Bilateral;   REMOVAL OF BILATERAL TISSUE EXPANDERS WITH PLACEMENT OF BILATERAL BREAST IMPLANTS Bilateral 12/19/2021   Procedure: REMOVAL OF BILATERAL TISSUE EXPANDERS WITH PLACEMENT OF BILATERAL BREAST IMPLANTS;  Surgeon: [name], MD;  Location: [facility];  Service: Plastics;  Laterality: Bilateral;   ROBOTIC ASSISTED TOTAL HYSTERECTOMY N/A 05/16/2017   Procedure: ROBOTIC ASSISTED TOTAL HYSTERECTOMY;  Surgeon: Delsa Bern, MD;  Location: [facility];  Service: Gynecology;  Laterality: N/A;   ROTATOR CUFF REPAIR Bilateral    SHOULDER ARTHROSCOPY WITH ROTATOR CUFF REPAIR AND SUBACROMIAL DECOMPRESSION Right 09/03/2018   Procedure: Right shoulder manipulation under anesthesia, exam under anesthesia, mini open rotator cuff repair, subacromial decompression;  Surgeon: Susa Day, MD;  Location: [facility];  Service: Orthopedics;  Laterality: Right;  90 mins   TUBAL LIGATION     WISDOM TOOTH EXTRACTION      FAMILY HISTORY: The patient's family history includes Breast cancer in her sister  and sister; Cancer in her sister and sister; Cancer (age of onset: 40) in her mother; Diabetes in her father; Hypertension in her mother.   SOCIAL HISTORY:  The patient  reports that she has never smoked. She has never used smokeless tobacco. She reports that she does not drink alcohol and does not use drugs.  Review of Systems  Cardiovascular:  Negative for chest pain, dyspnea on exertion, leg swelling, orthopnea, palpitations, paroxysmal nocturnal dyspnea and syncope.  Respiratory:  Negative for cough and shortness of breath.    PHYSICAL EXAM: Vitals with BMI 12/25/2021 12/19/2021 12/19/2021  Height 5' 7.5" - -  Weight 194 lbs - -  BMI 21.11 - -  Systolic 552 080 223  Diastolic 74 72 68  Pulse 87 75 76    CONSTITUTIONAL: Well-developed and well-nourished. No acute distress.  SKIN: Skin is warm and dry. No rash noted. No cyanosis. No pallor. No jaundice HEAD: Normocephalic and atraumatic.  EYES: No scleral icterus MOUTH/THROAT: Moist oral membranes.  NECK: No JVD present. No thyromegaly noted. No carotid bruits  LYMPHATIC: No visible cervical adenopathy.  CHEST Normal respiratory effort. No intercostal retractions.  Bilateral mastectomies, left JP drain. LUNGS: Clear to auscultation bilaterally.  No stridor. No wheezes. No rales.  CARDIOVASCULAR: Regular rate and rhythm, positive S1-S2, no murmurs rubs or gallops appreciated. ABDOMINAL: Soft, nontender, nondistended, positive bowel sounds in all 4 quadrants, no apparent ascites.  EXTREMITIES: No peripheral edema, warm to touch HEMATOLOGIC: No significant bruising NEUROLOGIC: Oriented to person, place, and time. Nonfocal. Normal muscle tone.  PSYCHIATRIC: Normal mood and affect. Normal behavior. Cooperative  CARDIAC DATABASE: EKG: 10/17/2020: Normal sinus rhythm, 62 bpm, left bundle branch block, ST-T changes most likely secondary to LBBB.  11/27/2021: NSR, 69bpm, LBBB.  Echocardiogram: 03/29/2021:  50-55%, G1DD, moderate MR,  Moderate TR, PASP 22mHG.   06/15/2021: LVEF 30-35%, severe hypokinesis of the anteroseptal and apex, moderate to severe LV dysfunction, grade 1 diastolic dysfunction, elevated LAP, global longitudinal strain -16.7%, mild to moderate Mr.   10/09/2021: 45-50%, no regional wall motion abnormalities, global longitudinal strain -13.2%, mild MR.   Stress Testing:  02/14/2018 [facility] Achieved 7 METS, normal blood pressure response to exercise, had exertional chest pain that improved/resolved during recovery, normal myocardial perfusion study no EKG abnormalities per report.  Heart Catheterization: 02/09/2020 by Dr. MJoya GaskinsPatwardhan: Right dominant system, no angiographically significant coronary artery disease.  Normal LVEDP.  LABORATORY DATA: CBC Latest Ref Rng & Units 11/24/2021 09/05/2021 08/29/2021  WBC 4.0 - 10.5 K/uL 6.2 10.2 6.3  Hemoglobin 12.0 - 15.0 g/dL 10.7(L) 11.3(L) 12.1  Hematocrit 36.0 - 46.0 % 32.8(L) 35.0(L) 39.0  Platelets 150 - 400 K/uL 353 276 291    CMP Latest Ref Rng & Units 12/07/2021 11/24/2021 09/05/2021  Glucose 70 - 99 mg/dL 107(H) 85 122(H)  BUN 8 - 27 mg/dL 17 15 11   Creatinine 0.57 - 1.00 mg/dL 0.50(L) 0.69 0.70  Sodium 134 - 144 mmol/L 140 136 134(L)  Potassium 3.5 - 5.2 mmol/L 4.4 4.0 4.3  Chloride 96 - 106 mmol/L 102 103 102  CO2 20 - 29 mmol/L 25 28 23   Calcium 8.7 - 10.3 mg/dL 9.7 10.1 8.9  Total Protein 6.5 - 8.1 g/dL - 7.9 -  Total Bilirubin 0.3 - 1.2 mg/dL - 0.3 -  Alkaline Phos 38 - 126 U/L - 82 -  AST 15 - 41 U/L - 13(L) -  ALT 0 - 44 U/L - 14 -    Lipid Panel: Outside records 06/24/2018: Total cholesterol 176, triglycerides 104, HDL 52, LDL 103  Lipid Panel  Lab Results  Component Value Date  CHOL 158 10/13/2020   HDL 44 10/13/2020   LDLCALC 94 10/13/2020   TRIG 107 10/13/2020   CHOLHDL 6.2 10/11/2013    Lab Results  Component Value Date   HGBA1C 7.2 (H) 08/01/2021   HGBA1C 6.1 (H)  08/28/2018   HGBA1C 6.8 (H) 10/11/2013   No components found for: NTPROBNP Lab Results  Component Value Date   TSH 0.599 10/11/2013   TSH 0.401 07/02/2012    Cardiac Panel (last 3 results) No results for input(s): CKTOTAL, CKMB, TROPONINIHS, RELINDX in the last 72 hours.  External Labs: Collected: 09/26/2021 available in Care Everywhere. Sodium 147, potassium 4.6, chloride 107, bicarb 26, BUN 9, creatinine 0.62 AST 25, ALT 39, alkaline phosphatase 88 EGFR 106 Hemoglobin A1c 7.1 TSH 0.52  Collected 02/16/2021: Total cholesterol 182, triglycerides 118, HDL 44, LDL 114, non-HDL 117   FINAL MEDICATION LIST END OF ENCOUNTER: No orders of the defined types were placed in this encounter.   Current Outpatient Medications:    acetaminophen (TYLENOL) 500 MG tablet, Take 1,000 mg by mouth every 6 (six) hours as needed for moderate pain., Disp: , Rfl:    Ascorbic Acid (VITAMIN C PO), Take by mouth in the morning., Disp: , Rfl:    aspirin 81 MG EC tablet, Take 40.5 mg by mouth in the morning. Swallow whole., Disp: , Rfl:    atorvastatin (LIPITOR) 20 MG tablet, TAKE 1 TABLET BY MOUTH EVERYDAY AT BEDTIME, Disp: 90 tablet, Rfl: 1   cetirizine (ZYRTEC) 10 MG tablet, Take 10 mg by mouth in the morning., Disp: , Rfl:    Cholecalciferol (VITAMIN D3) 25 MCG (1000 UT) CAPS, Take 1,000 Units by mouth in the morning., Disp: , Rfl:    ciprofloxacin (CIPRO) 500 MG tablet, Take 1 tablet (500 mg total) by mouth 2 (two) times daily for 14 days., Disp: 28 tablet, Rfl: 0   dapagliflozin propanediol (FARXIGA) 10 MG TABS tablet, Take 1 tablet (10 mg total) by mouth daily before breakfast., Disp: 90 tablet, Rfl: 0   ENTRESTO 49-51 MG, TAKE 1 TABLET BY MOUTH TWICE A DAY, Disp: 180 tablet, Rfl: 0   fluticasone (FLONASE) 50 MCG/ACT nasal spray, Place 2 sprays into both nostrils in the morning., Disp: , Rfl:    gabapentin (NEURONTIN) 100 MG capsule, Take 1 capsule (100 mg total) by mouth 2 (two) times daily.  (Patient taking differently: Take 100 mg by mouth 2 (two) times daily as needed (muscle spasms).), Disp: 30 capsule, Rfl: 1   GLUCOSAMINE-CHONDROITIN PO, Take 1 tablet by mouth every evening. , Disp: , Rfl:    glucose blood (ONETOUCH VERIO) test strip, TEST ONCE DAILY AS DIRECTED 90, Disp: , Rfl:    ibuprofen (ADVIL) 200 MG tablet, Take 400 mg by mouth every 6 (six) hours as needed (for pain.)., Disp: , Rfl:    metFORMIN (GLUCOPHAGE-XR) 500 MG 24 hr tablet, Take 500 mg by mouth 2 (two) times daily., Disp: , Rfl:    methocarbamol (ROBAXIN) 500 MG tablet, Take 1 tablet (500 mg total) by mouth every 8 (eight) hours as needed for muscle spasms., Disp: 20 tablet, Rfl: 0   metoprolol succinate (TOPROL-XL) 25 MG 24 hr tablet, Take 25 mg by mouth in the morning. Hold for low blood pressure, Disp: , Rfl:    Multiple Vitamin (MULTIVITAMIN WITH MINERALS) TABS tablet, Take 1 tablet by mouth daily. Centrum Silver for Women 50+, Disp: , Rfl:    multivitamin-lutein (OCUVITE-LUTEIN) CAPS capsule, Take 1 capsule by mouth every evening., Disp: ,  Rfl:    nitroGLYCERIN (NITROSTAT) 0.4 MG SL tablet, Place 0.4 mg under the tongue every 5 (five) minutes x 3 doses as needed for chest pain., Disp: , Rfl:    Olopatadine HCl 0.2 % SOLN, Place 1 drop into both eyes daily., Disp: , Rfl:    ondansetron (ZOFRAN) 4 MG tablet, Take 1 tablet (4 mg total) by mouth every 8 (eight) hours as needed for nausea or vomiting., Disp: 20 tablet, Rfl: 0   OneTouch Delica Lancets 41P MISC, Apply topically., Disp: , Rfl:    Polyethyl Glycol-Propyl Glycol (ULTRA LUBRICANT EYE DROPS OP), Place 1 drop into both eyes 3 (three) times daily as needed (for dry eyes). , Disp: , Rfl:    PROAIR HFA 108 (90 Base) MCG/ACT inhaler, Inhale 2 puffs into the lungs every 6 (six) hours as needed for wheezing or shortness of breath. , Disp: , Rfl:    Probiotic Product (PROBIOTIC DAILY PO), Take 1 capsule by mouth in the morning., Disp: , Rfl:    sertraline (ZOLOFT)  50 MG tablet, Take 50 mg by mouth in the morning., Disp: , Rfl:    solifenacin (VESICARE) 10 MG tablet, Take 10 mg by mouth in the morning., Disp: , Rfl:    SUPER B COMPLEX/C PO, Take 1 tablet by mouth in the morning., Disp: , Rfl:    traZODone (DESYREL) 50 MG tablet, Take 50 mg by mouth at bedtime as needed for sleep., Disp: , Rfl:   IMPRESSION:    ICD-10-CM   1. NICM (nonischemic cardiomyopathy) ([place])  I42.8     2. LBBB (left bundle branch block)  I44.7     3. Essential hypertension  I10     4. Non-insulin dependent type 2 diabetes mellitus ([place])  E11.9     5. Mixed hyperlipidemia  E78.2     6. Malignant neoplasm of upper-outer quadrant of left breast in female, estrogen receptor negative ([place])  C50.412    Z17.1        RECOMMENDATIONS: CAMANI SESAY is a 71 y.o. female whose past medical history and cardiovascular risk factors include: Nonischemic cardiomyopathy, left bundle branch block, type 2 diabetes mellitus, hyperlipidemia, postmenopausal female, advanced age.  NICM (nonischemic cardiomyopathy) ([place]) Currently euvolemic. No hospitalizations for congestive heart failure. Recent echocardiogram from December 2022 independently reviewed as part of today's office visit. Independently reviewed labs from December 07, 2021 after up titration of Farxiga. Medications reconciled. Recommended up titration of either Entresto or addition of spironolactone.  However, patient is already overwhelmed with the amount of medications, over-the-counter supplements, and postsurgery antibiotics.  Patient states that she would like to hold off on additional medication changes at this time and reconsider in several weeks. Recommend initiation of spironolactone 25 mg p.o. daily if blood pressure allows. Strict I's and O's and daily weights.  LBBB (left bundle branch block) Chronic and stable. Asymptomatic.   Monitor for now  Essential hypertension Office blood pressures are well  controlled. Medications reconciled. We emphasized the importance of a low-salt diet.  Non-insulin dependent type 2 diabetes mellitus ([place]) Most recent hemoglobin A1c 7.2. Medications reconciled. Monitor for now.   Mixed hyperlipidemia Currently on atorvastatin.   She denies myalgia or other side effects. Likely to be rechecked at upcoming physical April 2023 -currently managed by primary care provider.  Malignant neoplasm of upper-outer quadrant of left breast in female, estrogen receptor negative ([place]) Status post bilateral mastectomies November 2022. Currently being followed by oncology and plastic surgery.  --Continue cardiac  medications as reconciled in final medication list. --Return in about 4 weeks (around 01/22/2022) for heart failure management.. Or sooner if needed. --Continue follow-up with your primary care physician regarding the management of your other chronic comorbid conditions.  Patient's questions and concerns were addressed to her satisfaction. She voices understanding of the instructions provided during this encounter.   This note was created using a voice recognition software as a result there may be grammatical errors inadvertently enclosed that do not reflect the nature of this encounter. Every attempt is made to correct such errors.  Rex Kras, [place], [facility]  Pager: [phone] Office: [phone]

## 2021-12-26 ENCOUNTER — Ambulatory Visit (INDEPENDENT_AMBULATORY_CARE_PROVIDER_SITE_OTHER): Payer: Medicare Other | Admitting: Surgical

## 2021-12-26 ENCOUNTER — Encounter: Payer: Self-pay | Admitting: Surgical

## 2021-12-26 DIAGNOSIS — Z9889 Other specified postprocedural states: Secondary | ICD-10-CM

## 2021-12-26 DIAGNOSIS — D0511 Intraductal carcinoma in situ of right breast: Secondary | ICD-10-CM

## 2021-12-26 NOTE — Progress Notes (Signed)
Patient is a 71 year old female here for follow-up after removal of bilateral breast tissue expanders and placement of bilateral breast implants with [name] on 12/19/2021. ? ?She presents today for concerns of bleeding from the right breast.  Reports she had some drainage dripping over the right breast down to her abdomen and was concerned about this.  She is here with her daughter.  She is otherwise feeling well, reports the left JP drain output has been minimal.  She does have an appointment for tomorrow with [name] for postop evaluation. ? ?Chaperone present on exam ?On exam bilateral breast incisions appear intact, she does have some scant drainage from the right lateral breast incision that appears serosanguineous.  It does not appear to be frank blood.  There is no subcutaneous fluid collections noted palpation of either breast.  Left JP drain with serosanguineous output.  There is no erythema or cellulitic changes of either breast. ? ?Recommend Vaseline and gauze to right breast where there is some drainage, there appears to be a small opening in the skin edges of the right lateral breast between the running PDS sutures.  ? ?Recommend following up next week, can reschedule tomorrow's appointment.  Left JP drain is still in place, she is still on antibiotics.  Call with questions or concerns ?

## 2021-12-27 ENCOUNTER — Encounter: Payer: Medicare Other | Admitting: Plastic Surgery

## 2021-12-29 NOTE — Progress Notes (Incomplete)
Patient Care Team: [name], MD as PCP - General (Internal Medicine) Delsa Bern, MD as Attending Physician (Obstetrics and Gynecology) Posey Boyer, MD (Inactive) as Consulting Physician (Family Medicine) Rockwell Germany, RN as Oncology Nurse Navigator Mauro Kaufmann, RN as Oncology Nurse Navigator  DIAGNOSIS: No diagnosis found.  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%, PR 0%, HER-2 negative, Ki-67 31%   09/26/2005 - 11/13/2005 Radiation Therapy   Adjuvant radiation therapy   03/18/2008 Initial Biopsy   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   03/22/2008 Surgery   Right breast lumpectomy 0.9 cm DCIS ER 90% PR 98% stage 0   04/11/2008 Procedure   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   05/10/2008 - 07/02/2008 Radiation Therapy   Adjuvant radiation therapy   07/17/2008 - 04/16/2009 Anti-estrogen oral therapy   Femara then switched to tamoxifen which was discontinued in 2010 due to concern about uterine cancer   05/23/2021 Relapse/Recurrence   Screening detected right breast mass at 10 o'clock position measuring 1.2 cm, ultrasound-guided biopsy revealed grade 3 IDC ER 20%, PR 0%, HER2 positive 3+, Ki-67 50%   05/31/2021 Cancer Staging   Staging form: Breast, AJCC 7th Edition - Clinical: Stage IA (rT1c, N0, M0) - Signed by Nicholas Lose, MD on 05/31/2021 Stage  prefix: Recurrence Laterality: Right Biopsy of metastatic site performed: No Estrogen receptor status: Positive Progesterone receptor status: Negative HER2 status: Positive    09/04/2021 Surgery   Bilateral mastectomies Left mastectomy: Benign Right mastectomy: Grade 3 IDC with DCIS 3.6 cm, 0/6 lymph nodes negative, ER 20%, PR 0%, HER2 positive by IHC, Ki-67 50%   09/12/2021 Cancer Staging   Staging form: Breast, AJCC 7th Edition - Pathologic: Stage IIA (T2, N0, cM0) - Signed by Nicholas Lose, MD on 09/12/2021 Estrogen receptor status: Positive Progesterone receptor status: Positive HER2 status: Negative    Breast cancer of upper-outer quadrant of right female breast ([place])  09/04/2021 Initial Diagnosis   Breast cancer of upper-outer quadrant of right female breast ([place])   11/21/2021 -  Chemotherapy   Patient is on Treatment Plan : BREAST Trastuzumab q21d       CHIEF COMPLIANT: Patient is on Treatment Plan : BREAST Trastuzumab q21d   INTERVAL HISTORY: Jordan Willis is a 71- year-old patient with the above mentioned   history of right breast cancer having undergone bilateral mastectomies, currently on chemotherapy with Trastzumab. She presents to the clinic today for follow-up.    ALLERGIES:  is allergic to bactrim [sulfamethoxazole-trimethoprim], aspirin, and tape.  MEDICATIONS:  Current Outpatient Medications  Medication Sig Dispense Refill   acetaminophen (TYLENOL) 500 MG tablet Take 1,000 mg by mouth every 6 (six) hours as needed for moderate pain.     Ascorbic Acid (VITAMIN C PO) Take by mouth in the morning.     aspirin 81 MG EC tablet Take 40.5 mg by  mouth in the morning. Swallow whole.     atorvastatin (LIPITOR) 20 MG tablet TAKE 1 TABLET BY MOUTH EVERYDAY AT BEDTIME 90 tablet 1   cetirizine (ZYRTEC) 10 MG tablet Take 10 mg by mouth in the morning.     Cholecalciferol (VITAMIN D3) 25 MCG (1000 UT) CAPS Take 1,000 Units by mouth in the morning.     ciprofloxacin  (CIPRO) 500 MG tablet Take 1 tablet (500 mg total) by mouth 2 (two) times daily for 14 days. 28 tablet 0   dapagliflozin propanediol (FARXIGA) 10 MG TABS tablet Take 1 tablet (10 mg total) by mouth daily before breakfast. 90 tablet 0   ENTRESTO 49-51 MG TAKE 1 TABLET BY MOUTH TWICE A DAY 180 tablet 0   fluticasone (FLONASE) 50 MCG/ACT nasal spray Place 2 sprays into both nostrils in the morning.     gabapentin (NEURONTIN) 100 MG capsule Take 1 capsule (100 mg total) by mouth 2 (two) times daily. (Patient taking differently: Take 100 mg by mouth 2 (two) times daily as needed (muscle spasms).) 30 capsule 1   GLUCOSAMINE-CHONDROITIN PO Take 1 tablet by mouth every evening.      glucose blood (ONETOUCH VERIO) test strip TEST ONCE DAILY AS DIRECTED 90     ibuprofen (ADVIL) 200 MG tablet Take 400 mg by mouth every 6 (six) hours as needed (for pain.).     metFORMIN (GLUCOPHAGE-XR) 500 MG 24 hr tablet Take 500 mg by mouth 2 (two) times daily.     methocarbamol (ROBAXIN) 500 MG tablet Take 1 tablet (500 mg total) by mouth every 8 (eight) hours as needed for muscle spasms. 20 tablet 0   metoprolol succinate (TOPROL-XL) 25 MG 24 hr tablet Take 25 mg by mouth in the morning. Hold for low blood pressure     Multiple Vitamin (MULTIVITAMIN WITH MINERALS) TABS tablet Take 1 tablet by mouth daily. Centrum Silver for Women 50+     multivitamin-lutein (OCUVITE-LUTEIN) CAPS capsule Take 1 capsule by mouth every evening.     nitroGLYCERIN (NITROSTAT) 0.4 MG SL tablet Place 0.4 mg under the tongue every 5 (five) minutes x 3 doses as needed for chest pain.     Olopatadine HCl 0.2 % SOLN Place 1 drop into both eyes daily.     ondansetron (ZOFRAN) 4 MG tablet Take 1 tablet (4 mg total) by mouth every 8 (eight) hours as needed for nausea or vomiting. 20 tablet 0   OneTouch Delica Lancets 82X MISC Apply topically.     Polyethyl Glycol-Propyl Glycol (ULTRA LUBRICANT EYE DROPS OP) Place 1 drop into both eyes 3 (three) times  daily as needed (for dry eyes).      PROAIR HFA 108 (90 Base) MCG/ACT inhaler Inhale 2 puffs into the lungs every 6 (six) hours as needed for wheezing or shortness of breath.      Probiotic Product (PROBIOTIC DAILY PO) Take 1 capsule by mouth in the morning.     sertraline (ZOLOFT) 50 MG tablet Take 50 mg by mouth in the morning.     solifenacin (VESICARE) 10 MG tablet Take 10 mg by mouth in the morning.     SUPER B COMPLEX/C PO Take 1 tablet by mouth in the morning.     traZODone (DESYREL) 50 MG tablet Take 50 mg by mouth at bedtime as needed for sleep.     No current facility-administered medications for this visit.    PHYSICAL EXAMINATION: ECOG PERFORMANCE STATUS: {CHL ONC ECOG [phone]}  There were no  vitals filed for this visit. There were no vitals filed for this visit.  BREAST:*** No palpable masses or nodules in either right or left breasts. No palpable axillary supraclavicular or infraclavicular adenopathy no breast tenderness or nipple discharge. (exam performed in the presence of a chaperone)  LABORATORY DATA:  I have reviewed the data as listed CMP Latest Ref Rng & Units 12/07/2021 11/24/2021 09/05/2021  Glucose 70 - 99 mg/dL 107(H) 85 122(H)  BUN 8 - 27 mg/dL 17 15 11   Creatinine 0.57 - 1.00 mg/dL 0.50(L) 0.69 0.70  Sodium 134 - 144 mmol/L 140 136 134(L)  Potassium 3.5 - 5.2 mmol/L 4.4 4.0 4.3  Chloride 96 - 106 mmol/L 102 103 102  CO2 20 - 29 mmol/L 25 28 23   Calcium 8.7 - 10.3 mg/dL 9.7 10.1 8.9  Total Protein 6.5 - 8.1 g/dL - 7.9 -  Total Bilirubin 0.3 - 1.2 mg/dL - 0.3 -  Alkaline Phos 38 - 126 U/L - 82 -  AST 15 - 41 U/L - 13(L) -  ALT 0 - 44 U/L - 14 -    Lab Results  Component Value Date   WBC 6.2 11/24/2021   HGB 10.7 (L) 11/24/2021   HCT 32.8 (L) 11/24/2021   MCV 86.8 11/24/2021   PLT 353 11/24/2021   NEUTROABS 3.0 11/24/2021    ASSESSMENT & PLAN:  No problem-specific Assessment & Plan notes found for this encounter.    No orders of the  defined types were placed in this encounter.  The patient has a good understanding of the overall plan. she agrees with it. she will call with any problems that may develop before the next visit here. Total time spent: 30 mins including face to face time and time spent for planning, charting and co-ordination of care   Suzzette Righter, Tse Bonito 12/29/21    I, Gardiner Coins, am acting as a Education administrator for [name]

## 2022-01-02 ENCOUNTER — Other Ambulatory Visit: Payer: Self-pay

## 2022-01-02 ENCOUNTER — Inpatient Hospital Stay (HOSPITAL_BASED_OUTPATIENT_CLINIC_OR_DEPARTMENT_OTHER): Payer: Medicare Other | Admitting: Hematology and Oncology

## 2022-01-02 ENCOUNTER — Inpatient Hospital Stay: Payer: Medicare Other | Attending: Hematology and Oncology

## 2022-01-02 VITALS — BP 137/61 | HR 84 | Temp 97.8°F | Resp 17 | Ht 67.5 in | Wt 192.7 lb

## 2022-01-02 DIAGNOSIS — Z803 Family history of malignant neoplasm of breast: Secondary | ICD-10-CM | POA: Diagnosis not present

## 2022-01-02 DIAGNOSIS — C50412 Malignant neoplasm of upper-outer quadrant of left female breast: Secondary | ICD-10-CM | POA: Insufficient documentation

## 2022-01-02 DIAGNOSIS — R5383 Other fatigue: Secondary | ICD-10-CM | POA: Diagnosis not present

## 2022-01-02 DIAGNOSIS — Z5112 Encounter for antineoplastic immunotherapy: Secondary | ICD-10-CM | POA: Insufficient documentation

## 2022-01-02 DIAGNOSIS — Z171 Estrogen receptor negative status [ER-]: Secondary | ICD-10-CM | POA: Diagnosis not present

## 2022-01-02 DIAGNOSIS — Z9013 Acquired absence of bilateral breasts and nipples: Secondary | ICD-10-CM | POA: Insufficient documentation

## 2022-01-02 DIAGNOSIS — C50411 Malignant neoplasm of upper-outer quadrant of right female breast: Secondary | ICD-10-CM

## 2022-01-02 MED ORDER — DIPHENHYDRAMINE HCL 25 MG PO CAPS
25.0000 mg | ORAL_CAPSULE | Freq: Once | ORAL | Status: AC
  Administered 2022-01-02: 25 mg via ORAL
  Filled 2022-01-02: qty 1

## 2022-01-02 MED ORDER — ACETAMINOPHEN 325 MG PO TABS
650.0000 mg | ORAL_TABLET | Freq: Once | ORAL | Status: AC
  Administered 2022-01-02: 650 mg via ORAL
  Filled 2022-01-02: qty 2

## 2022-01-02 MED ORDER — TRASTUZUMAB-HYALURONIDASE-OYSK 600-10000 MG-UNT/5ML ~~LOC~~ SOLN
600.0000 mg | Freq: Once | SUBCUTANEOUS | Status: AC
  Administered 2022-01-02: 600 mg via SUBCUTANEOUS
  Filled 2022-01-02: qty 5

## 2022-01-02 NOTE — Assessment & Plan Note (Signed)
09/12/2021:Bilateral mastectomies ?Left mastectomy: Benign ?Right mastectomy: Grade 3 IDC with DCIS 3.6 cm, 0/6 lymph nodes negative, ER 20%, PR 0%, HER2 positive by IHC, Ki-67 50% ?? 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 ?Follows with cardiology. ?  ?Return to clinic in 3 weeks for cycle 3 ?

## 2022-01-02 NOTE — Patient Instructions (Signed)
[facility]   ?Discharge Instructions: ?Thank you for choosing [facility] to provide your oncology and hematology care.  ? ?If you have a lab appointment with the [facility], please go directly to the [facility] and check in at the registration area. ?  ?Wear comfortable clothing and clothing appropriate for easy access to any Portacath or PICC line.  ? ?We strive to give you quality time with your provider. You may need to reschedule your appointment if you arrive late (15 or more minutes).  Arriving late affects you and other patients whose appointments are after yours.  Also, if you miss three or more appointments without notifying the office, you may be dismissed from the clinic at the provider?s discretion.    ?  ?For prescription refill requests, have your pharmacy contact our office and allow 72 hours for refills to be completed.   ? ?Today you received the following chemotherapy and/or immunotherapy agents: trastuzumab-hyaluronidase-oysk    ?  ?To help prevent nausea and vomiting after your treatment, we encourage you to take your nausea medication as directed. ? ?BELOW ARE SYMPTOMS THAT SHOULD BE REPORTED IMMEDIATELY: ?*FEVER GREATER THAN 100.4 F (38 ?C) OR HIGHER ?*CHILLS OR SWEATING ?*NAUSEA AND VOMITING THAT IS NOT CONTROLLED WITH YOUR NAUSEA MEDICATION ?*UNUSUAL SHORTNESS OF BREATH ?*UNUSUAL BRUISING OR BLEEDING ?*URINARY PROBLEMS (pain or burning when urinating, or frequent urination) ?*BOWEL PROBLEMS (unusual diarrhea, constipation, pain near the anus) ?TENDERNESS IN MOUTH AND THROAT WITH OR WITHOUT PRESENCE OF ULCERS (sore throat, sores in mouth, or a toothache) ?UNUSUAL RASH, SWELLING OR PAIN  ?UNUSUAL VAGINAL DISCHARGE OR ITCHING  ? ?Items with * indicate a potential emergency and should be followed up as soon as possible or go to the Emergency Department if any problems should occur. ? ?Please show the CHEMOTHERAPY ALERT CARD or IMMUNOTHERAPY  ALERT CARD at check-in to the Emergency Department and triage nurse. ? ?Should you have questions after your visit or need to cancel or reschedule your appointment, please contact [facility]  Dept: [phone]  and follow the prompts.  Office hours are 8:00 a.m. to 4:30 p.m. Monday - Friday. Please note that voicemails left after 4:00 p.m. may not be returned until the following business day.  We are closed weekends and major holidays. You have access to a nurse at all times for urgent questions. Please call the main number to the clinic Dept: [phone] and follow the prompts. ? ? ?For any non-urgent questions, you may also contact your provider using MyChart. We now offer e-Visits for anyone 45 and older to request care online for non-urgent symptoms. For details visit [email or web address]. ?  ?Also download the MyChart app! Go to the app store, search "MyChart", open the app, select [facility], and log in with your MyChart username and password. ? ?Due to Covid, a mask is required upon entering the hospital/clinic. If you do not have a mask, one will be given to you upon arrival. For doctor visits, patients may have 1 support person aged 12 or older with them. For treatment visits, patients cannot have anyone with them due to current Covid guidelines and our immunocompromised population.  ? ?Trastuzumab; Hyaluronidase injection ?What is this medication? ?TRASTUZUMAB; HYALURONIDASE (tras TOO zoo mab / hye al ur ON i dase) is used to treat breast cancer and stomach cancer. Trastuzumab is a monoclonal antibody. Hyaluronidase is used to improve the effects of trastuzumab. ?This medicine may be used  for other purposes; ask your health care provider or pharmacist if you have questions. ?COMMON BRAND NAME(S): HERCEPTIN HYLECTA ?What should I tell my care team before I take this medication? ?They need to know if you have any of these conditions: ?heart disease ?heart failure ?lung or  breathing disease, like asthma ?an unusual or allergic reaction to trastuzumab, or other medications, foods, dyes, or preservatives ?pregnant or trying to get pregnant ?breast-feeding ?How should I use this medication? ?This medicine is for injection under the skin. It is given by a health care professional in a hospital or clinic setting. ?Talk to your pediatrician regarding the use of this medicine in children. This medicine is not approved for use in children. ?Overdosage: If you think you have taken too much of this medicine contact a poison control center or emergency room at once. ?NOTE: This medicine is only for you. Do not share this medicine with others. ?What if I miss a dose? ?It is important not to miss a dose. Call your doctor or health care professional if you are unable to keep an appointment. ?What may interact with this medication? ?This medicine may interact with the following medications: ?certain types of chemotherapy, such as daunorubicin, doxorubicin, epirubicin, and idarubicin ?This list may not describe all possible interactions. Give your health care provider a list of all the medicines, herbs, non-prescription drugs, or dietary supplements you use. Also tell them if you smoke, drink alcohol, or use illegal drugs. Some items may interact with your medicine. ?What should I watch for while using this medication? ?Visit your doctor for checks on your progress. Report any side effects. Continue your course of treatment even though you feel ill unless your doctor tells you to stop. ?Call your doctor or health care professional for advice if you get a fever, chills or sore throat, or other symptoms of a cold or flu. Do not treat yourself. Try to avoid being around people who are sick. ?You may experience fever, chills and shaking during your first infusion. These effects are usually mild and can be treated with other medicines. Report any side effects during the infusion to your health care  professional. Fever and chills usually do not happen with later infusions. ?Do not become pregnant while taking this medicine or for 7 months after stopping it. Women should inform their doctor if they wish to become pregnant or think they might be pregnant. Women of child-bearing potential will need to have a negative pregnancy test before starting this medicine. There is a potential for serious side effects to an unborn child. Talk to your health care professional or pharmacist for more information. Do not breast-feed an infant while taking this medicine or for 7 months after stopping it. ?What side effects may I notice from receiving this medication? ?Side effects that you should report to your doctor or health care professional as soon as possible: ?allergic reactions like skin rash, itching or hives, swelling of the face, lips, or tongue ?breathing problems ?chest pain or palpitations ?cough ?fever ?general ill feeling or flu-like symptoms ?signs of worsening heart failure like breathing problems; swelling in your legs and feet ?Side effects that usually do not require medical attention (report these to your doctor or health care professional if they continue or are bothersome): ?bone pain ?changes in taste ?diarrhea ?joint pain ?nausea/vomiting ?unusually weak or tired ?weight loss ?This list may not describe all possible side effects. Call your doctor for medical advice about side effects. You may report side  effects to FDA at 1-800-FDA-1088. ?Where should I keep my medication? ?This drug is given in a hospital or clinic and will not be stored at home. ?NOTE: This sheet is a summary. It may not cover all possible information. If you have questions about this medicine, talk to your doctor, pharmacist, or health care provider. ?? 2022 Elsevier/Gold Standard (2018-02-24 00:00:00) ? ? ?

## 2022-01-04 ENCOUNTER — Ambulatory Visit (INDEPENDENT_AMBULATORY_CARE_PROVIDER_SITE_OTHER): Payer: Medicare Other | Admitting: Plastic Surgery

## 2022-01-04 ENCOUNTER — Other Ambulatory Visit: Payer: Self-pay

## 2022-01-04 DIAGNOSIS — Z9889 Other specified postprocedural states: Secondary | ICD-10-CM

## 2022-01-04 NOTE — Progress Notes (Signed)
Patient presents postop from exchange of bilateral breast tissue expanders for gel implants.  She feels like things are going well.  She did have an episode with some drainage on the right side but that is since resolved.  She feels like things are going well.  Drain on the left side is putting out about 10 cc/day so it was removed today.  On exam her incisions look intact.  I do not detect any obvious subcutaneous fluid.  She is reasonably symmetric.  We will plan to continue supportive garments and avoid strenuous activity and see her again in 2 weeks.  All of her questions were answered. ?

## 2022-01-09 DIAGNOSIS — C50912 Malignant neoplasm of unspecified site of left female breast: Secondary | ICD-10-CM | POA: Diagnosis not present

## 2022-01-11 ENCOUNTER — Ambulatory Visit (INDEPENDENT_AMBULATORY_CARE_PROVIDER_SITE_OTHER): Payer: Medicare Other | Admitting: Physician Assistant

## 2022-01-11 ENCOUNTER — Telehealth: Payer: Self-pay | Admitting: Plastic Surgery

## 2022-01-11 ENCOUNTER — Other Ambulatory Visit: Payer: Self-pay

## 2022-01-11 DIAGNOSIS — Z9889 Other specified postprocedural states: Secondary | ICD-10-CM

## 2022-01-11 MED ORDER — DOXYCYCLINE HYCLATE 100 MG PO TABS: 100.0000 mg | ORAL_TABLET | Freq: Two times a day (BID) | ORAL | 0 refills | Status: AC

## 2022-01-11 NOTE — Telephone Encounter (Signed)
Pt is calling in with a concern about her R breast being a little larger than the L breast and wanted to know if she should be concerned about it.  Pt would like to have a call back. ?

## 2022-01-11 NOTE — Progress Notes (Signed)
Patient is a 71 year old female with PMH of breast cancer s/p reconstruction with implant exchange performed 12/19/2021 by [name] who presents to clinic for postoperative follow-up. ? ?She was last seen here in clinic on 01/04/2022 with [name].  At that time, she mentioned drainage from right side that had since resolved.  Drain on left side had been putting out approximately 10 cc/day, removed without complication or difficulty.  No obvious residual subcutaneous fluid noted and her incisions look to be intact.  Plan is for continued compressive garments and activity modifications. ? ?Today, she is accompanied by her children at bedside.  They state that over the course the past 3 days she has complained of increased redness, swelling, and discomfort involving right breast.  She states that they have seen yellowish drainage from the incision, as well.  No particularly foul odor.  Denies fevers or other systemic symptoms at present.  She has had some diarrhea, but suspect that this could also be related to her chemo infusions.  Will occasionally experience left-sided breast discomfort, but overall feels as though the left side is doing well. ? ?Physical exam with mildly erythematous and swollen right breast relative to contralateral side.  She has had radiation on each side, but this erythema is reportedly new over the course of the past few days. ?No large wounds or dehiscence noted on exam.  No obvious subcutaneous fluid collections appreciated. ? ?Reviewed photos from 09/28/2021, prior to implant exchange.  At that time, her right breast also looked to be darker than the contralateral side.  However, per patient and family reports they have noticed increased erythema and swelling over the course the past few days.  It is subjectively swollen on exam compared to other side.  I was not able to express any considerable volume, but there was scant thin clearish yellow drainage coming from the incision on  palpation. ? ?We will treat with doxycycline x7 days given allergy to sulfamethoxazole.  She already has an appointment scheduled for next week which I would like for her to keep so that we could follow her closely.  No systemic symptoms at present, but they understand to call the clinic should she develop any new or worsening symptoms.  Picture(s) obtained of the patient and placed in the chart were with the patient's or guardian's permission. ? ? ?

## 2022-01-11 NOTE — Telephone Encounter (Signed)
Pts daughter is calling in stating that her mothers R breast is swollen and she is in pain w/some leakage pt stated that a little liquid came out on last night and it does feel different. ?

## 2022-01-11 NOTE — Telephone Encounter (Signed)
Spoke with [name] regarding the message below, and he would like for the patient to come in today to see Garrett,PA-C.   ? ?Patient's daughter called and informed that [name] would like for the patient to come in today to see Garrett,PA-C.  Daughter agreed and patient was scheduled at (12:40) with Garrett,PA-C.//AB/CMA ?

## 2022-01-18 ENCOUNTER — Ambulatory Visit (INDEPENDENT_AMBULATORY_CARE_PROVIDER_SITE_OTHER): Payer: Medicare Other | Admitting: Plastic Surgery

## 2022-01-18 ENCOUNTER — Other Ambulatory Visit: Payer: Self-pay

## 2022-01-18 DIAGNOSIS — Z9889 Other specified postprocedural states: Secondary | ICD-10-CM

## 2022-01-18 NOTE — Progress Notes (Signed)
Patient presents postop from exchange of tissue expanders to implants.  She feels much better this week.  She had had some swelling and drainage on the right side.  She has been started on antibiotics which she is still taking.  She feels that they are helping quite a bit.  On exam her incisions are still intact bilaterally.  No obvious swelling or erythema on either side.  No obvious wound dehiscence.  I have continued to explained to her the tenuous nature of her reconstruction as she continues to have some minor wound healing problems.  At this point however I do not see any real need to pursue any other surgical treatments.  We will plan to see how things go and see her again in 2 weeks.  All of her questions were answered. ?

## 2022-01-22 NOTE — Progress Notes (Signed)
Per Dr Lindi Adie ok to treat 01/23/2022 with echo from 12/19 for her trastuzumab infusion. Echo scheduled.  ?

## 2022-01-23 ENCOUNTER — Encounter: Payer: Self-pay | Admitting: *Deleted

## 2022-01-23 ENCOUNTER — Inpatient Hospital Stay: Payer: Medicare Other | Attending: Hematology and Oncology

## 2022-01-23 ENCOUNTER — Other Ambulatory Visit: Payer: Self-pay

## 2022-01-23 VITALS — BP 141/52 | HR 74 | Temp 98.0°F | Resp 18 | Wt 190.8 lb

## 2022-01-23 DIAGNOSIS — Z803 Family history of malignant neoplasm of breast: Secondary | ICD-10-CM | POA: Diagnosis not present

## 2022-01-23 DIAGNOSIS — Z171 Estrogen receptor negative status [ER-]: Secondary | ICD-10-CM | POA: Insufficient documentation

## 2022-01-23 DIAGNOSIS — Z9013 Acquired absence of bilateral breasts and nipples: Secondary | ICD-10-CM | POA: Diagnosis not present

## 2022-01-23 DIAGNOSIS — R5383 Other fatigue: Secondary | ICD-10-CM | POA: Diagnosis not present

## 2022-01-23 DIAGNOSIS — C50412 Malignant neoplasm of upper-outer quadrant of left female breast: Secondary | ICD-10-CM | POA: Insufficient documentation

## 2022-01-23 DIAGNOSIS — C50411 Malignant neoplasm of upper-outer quadrant of right female breast: Secondary | ICD-10-CM

## 2022-01-23 DIAGNOSIS — Z5112 Encounter for antineoplastic immunotherapy: Secondary | ICD-10-CM | POA: Diagnosis not present

## 2022-01-23 MED ORDER — TRASTUZUMAB-HYALURONIDASE-OYSK 600-10000 MG-UNT/5ML ~~LOC~~ SOLN
600.0000 mg | Freq: Once | SUBCUTANEOUS | Status: AC
  Administered 2022-01-23: 600 mg via SUBCUTANEOUS
  Filled 2022-01-23: qty 5

## 2022-01-23 MED ORDER — ACETAMINOPHEN 325 MG PO TABS
650.0000 mg | ORAL_TABLET | Freq: Once | ORAL | Status: AC
  Administered 2022-01-23: 650 mg via ORAL
  Filled 2022-01-23: qty 2

## 2022-01-23 MED ORDER — DIPHENHYDRAMINE HCL 25 MG PO CAPS
25.0000 mg | ORAL_CAPSULE | Freq: Once | ORAL | Status: AC
  Administered 2022-01-23: 25 mg via ORAL
  Filled 2022-01-23: qty 1

## 2022-01-24 ENCOUNTER — Ambulatory Visit (INDEPENDENT_AMBULATORY_CARE_PROVIDER_SITE_OTHER): Payer: Medicare Other | Admitting: Physician Assistant

## 2022-01-24 ENCOUNTER — Other Ambulatory Visit: Payer: Self-pay

## 2022-01-24 ENCOUNTER — Other Ambulatory Visit: Payer: Self-pay | Admitting: Cardiology

## 2022-01-24 DIAGNOSIS — I428 Other cardiomyopathies: Secondary | ICD-10-CM

## 2022-01-24 DIAGNOSIS — I10 Essential (primary) hypertension: Secondary | ICD-10-CM

## 2022-01-24 DIAGNOSIS — Z9889 Other specified postprocedural states: Secondary | ICD-10-CM

## 2022-01-24 NOTE — Progress Notes (Signed)
Patient is a 71 year old female with PMH of breast cancer s/p reconstruction with implant exchange performed 12/19/2021 by [name] who presents to clinic for postoperative follow-up. ?  ?She was last seen here in clinic on 01/18/2022.  At that time, she had been taking antibiotics given concern for early infection.  She felt as though they were hoping.  Her incisions were still intact bilaterally without evidence of dehiscence.  However, they did have discussion about tenuous nature of her reconstruction given wound healing issues.  Plan was for close follow-up. ? ?Today, family and patient are concerned for new incisional wound.  They state that it drained a little bit.  She states that she had chemotherapy yesterday and says she felt mildly fatigued afterwards.  She confirms that this is not unusual for her in the setting of chemotherapy.  She denies any fevers or other systemic symptoms.  No erythema around the wound.  Family at she reports the wound looks better than it did yesterday.  They also report that the sort of thing happened on the contralateral side after expander placement and it seemed to heal okay. ? ?Physical exam with 1 x 1 cm wound over left breast incision.  No obvious full-thickness wound, cannot visualize implant.  Looks like based layer of yellow slough.  No surrounding erythema.  No swelling or underlying subcutaneous fluids.  No fluctuance or crepitus.  No drainage. ? ?Discussed possible need to go to the OR for implant removal.  At that point, we will not exchange for new implant.  Patient and family are very much in agreement with this plan as they have had difficulties with wound healing throughout her reconstruction.  Given that there is no obvious full-thickness injury or evidence of infection, will not schedule OR removal at this time.  She appears well.  Do not feel as though antibiotics are warranted, particular given that she has been on so many over the course of the past few  months.  No evidence of infection currently. ? ?She will call the clinic should she develop any new or worsening symptoms.  Otherwise, plan for her to return for her visit next week as scheduled. ?

## 2022-01-25 ENCOUNTER — Ambulatory Visit: Payer: Medicaid Other | Admitting: Cardiology

## 2022-01-29 ENCOUNTER — Ambulatory Visit (HOSPITAL_COMMUNITY)
Admission: RE | Admit: 2022-01-29 | Discharge: 2022-01-29 | Disposition: A | Discharge status: Home / Self Care | Payer: Medicare Other | Source: Ambulatory Visit | Attending: Hematology and Oncology | Admitting: Hematology and Oncology

## 2022-01-29 DIAGNOSIS — I340 Nonrheumatic mitral (valve) insufficiency: Secondary | ICD-10-CM | POA: Diagnosis not present

## 2022-01-29 DIAGNOSIS — E119 Type 2 diabetes mellitus without complications: Secondary | ICD-10-CM | POA: Diagnosis not present

## 2022-01-29 DIAGNOSIS — C50412 Malignant neoplasm of upper-outer quadrant of left female breast: Secondary | ICD-10-CM | POA: Diagnosis not present

## 2022-01-29 DIAGNOSIS — I10 Essential (primary) hypertension: Secondary | ICD-10-CM | POA: Diagnosis not present

## 2022-01-29 DIAGNOSIS — R0602 Shortness of breath: Secondary | ICD-10-CM | POA: Diagnosis not present

## 2022-01-29 DIAGNOSIS — Z171 Estrogen receptor negative status [ER-]: Secondary | ICD-10-CM | POA: Diagnosis not present

## 2022-01-29 DIAGNOSIS — I429 Cardiomyopathy, unspecified: Secondary | ICD-10-CM | POA: Diagnosis not present

## 2022-01-29 DIAGNOSIS — I420 Dilated cardiomyopathy: Secondary | ICD-10-CM | POA: Diagnosis not present

## 2022-01-29 DIAGNOSIS — Z01818 Encounter for other preprocedural examination: Secondary | ICD-10-CM | POA: Diagnosis not present

## 2022-01-29 DIAGNOSIS — I361 Nonrheumatic tricuspid (valve) insufficiency: Secondary | ICD-10-CM | POA: Diagnosis not present

## 2022-01-29 NOTE — Progress Notes (Signed)
Echocardiogram ?2D Echocardiogram has been performed. ? ?Jordan Willis ?01/29/2022, 8:45 AM ?

## 2022-01-30 ENCOUNTER — Encounter: Payer: Self-pay | Admitting: Cardiology

## 2022-01-30 ENCOUNTER — Ambulatory Visit: Payer: Medicaid Other | Admitting: Cardiology

## 2022-01-30 VITALS — BP 134/54 | HR 69 | Temp 98.0°F | Resp 16 | Ht 67.0 in | Wt 191.0 lb

## 2022-01-30 DIAGNOSIS — E119 Type 2 diabetes mellitus without complications: Secondary | ICD-10-CM | POA: Diagnosis not present

## 2022-01-30 DIAGNOSIS — Z171 Estrogen receptor negative status [ER-]: Secondary | ICD-10-CM

## 2022-01-30 DIAGNOSIS — I10 Essential (primary) hypertension: Secondary | ICD-10-CM

## 2022-01-30 DIAGNOSIS — I428 Other cardiomyopathies: Secondary | ICD-10-CM | POA: Diagnosis not present

## 2022-01-30 DIAGNOSIS — I447 Left bundle-branch block, unspecified: Secondary | ICD-10-CM

## 2022-01-30 DIAGNOSIS — E782 Mixed hyperlipidemia: Secondary | ICD-10-CM

## 2022-01-30 DIAGNOSIS — I5042 Chronic combined systolic (congestive) and diastolic (congestive) heart failure: Secondary | ICD-10-CM | POA: Diagnosis not present

## 2022-01-30 LAB — ECHOCARDIOGRAM COMPLETE
AR max vel: 1.78 cm2
AV Area VTI: 1.6 cm2
AV Area mean vel: 1.67 cm2
AV Mean grad: 2 mmHg
AV Peak grad: 4.4 mmHg
Ao pk vel: 1.05 m/s
Area-P 1/2: 3.89 cm2
S' Lateral: 3.9 cm

## 2022-01-30 MED ORDER — ENTRESTO 97-103 MG PO TABS: 1.0000 | ORAL_TABLET | Freq: Two times a day (BID) | ORAL | 0 refills | Status: DC

## 2022-01-30 NOTE — Progress Notes (Signed)
? ?[name] ?Date of Birth: [birth year] ?MRN: [id number] ?Primary Care Provider:Pharr, Thayer Jew, MD  ?Former Cardiology Providers: Miquel Dunn, MSN, APRN, FNP-C ?Primary Cardiologist: Rex Kras, DO (established care 01/20/2020) ? ?Date: 01/30/22 ?Last Office Visit: 12/25/2021 ? ?Chief Complaint  ?Patient presents with  ? NICM  ? Congestive Heart Failure  ? Follow-up  ?  4 week  ? Results  ?  echo  ? ?HPI  ?Jordan Willis is a 71 y.o.  female whose past medical history and cardiovascular risk factors include: Left bundle branch block, type 2 diabetes mellitus, hyperlipidemia, nonischemic cardiomyopathy, chronic combined systolic/diastolic heart failure, recurrent breast cancer status post bilateral mastectomies (currently on chemotherapy), postmenopausal female, advanced age. ? ?This patient is accompanied in the office by her  daughter, Jordan Willis and son. Jordan Willis provides verbal consent with regards to having them present during today's encounter. ? ?Patient is being followed by the practice for management of nonischemic cardiomyopathy likely due to chemotherapy induced.  In June 2022 her LVEF was 50-55% and after recurrence of her breast cancer and initiating chemotherapy patient's LVEF was as low as 30-35% in August 2022.  Since then her medications were uptitrated and the most recent echocardiogram as of December 2023 was reported to be 45-50%. ? ?At last office visit recommended further up titration of GDMT however, given the fact that she was on antibiotics, chemotherapy, and other over-the-counter supplements patient did not want to uptitrate medical therapy.  Since last visit she had an echocardiogram yesterday which was independently reviewed as part of today's office visit which notes slight reduction in LVEF at 40-45% with slight reduction in global longitudinal strain as well compared to prior studies.  Final report forthcoming. ? ?Clinically she denies chest pain, orthopnea, paroxysmal  nocturnal dyspnea or lower extremity swelling.  However she does experience shortness of breath with effort related activities.  She is not in florid heart failure but her filling pressures are increasing based on the hemodynamics noted on echo. ? ?FUNCTIONAL STATUS: No structured exercise program or daily routine. ? ?ALLERGIES: ?Allergies  ?Allergen Reactions  ? Bactrim [Sulfamethoxazole-Trimethoprim] Shortness Of Breath  ?  Shortness of breath, difficulty breathing, headache, rash, fatigue  ? Aspirin Palpitations and Other (See Comments)  ?  Heart fluttering, pt takes 40.5 mg (half of 81 mg) daily  ? Tape Rash  ?  Tears skin off  ? ?MEDICATION LIST PRIOR TO VISIT: ?Current Outpatient Medications on File Prior to Visit  ?Medication Sig Dispense Refill  ? acetaminophen (TYLENOL) 500 MG tablet Take 1,000 mg by mouth every 6 (six) hours as needed for moderate pain.    ? Ascorbic Acid (VITAMIN C PO) Take by mouth in the morning.    ? aspirin 81 MG EC tablet Take 40.5 mg by mouth in the morning. Swallow whole.    ? atorvastatin (LIPITOR) 20 MG tablet TAKE 1 TABLET BY MOUTH EVERYDAY AT BEDTIME 90 tablet 1  ? cetirizine (ZYRTEC) 10 MG tablet Take 10 mg by mouth in the morning.    ? Cholecalciferol (VITAMIN D3) 25 MCG (1000 UT) CAPS Take 1,000 Units by mouth in the morning.    ? FARXIGA 10 MG TABS tablet TAKE 1 TABLET BY MOUTH DAILY BEFORE BREAKFAST. 90 tablet 0  ? fluticasone (FLONASE) 50 MCG/ACT nasal spray Place 2 sprays into both nostrils in the morning.    ? gabapentin (NEURONTIN) 100 MG capsule Take 1 capsule (100 mg total) by mouth 2 (two) times daily. (Patient taking  differently: Take 100 mg by mouth 2 (two) times daily as needed (muscle spasms).) 30 capsule 1  ? GLUCOSAMINE-CHONDROITIN PO Take 1 tablet by mouth every evening.     ? glucose blood (ONETOUCH VERIO) test strip TEST ONCE DAILY AS DIRECTED 90    ? ibuprofen (ADVIL) 200 MG tablet Take 400 mg by mouth every 6 (six) hours as needed (for pain.).    ?  metFORMIN (GLUCOPHAGE-XR) 500 MG 24 hr tablet Take 500 mg by mouth 2 (two) times daily.    ? methocarbamol (ROBAXIN) 500 MG tablet Take 1 tablet (500 mg total) by mouth every 8 (eight) hours as needed for muscle spasms. 20 tablet 0  ? metoprolol succinate (TOPROL-XL) 25 MG 24 hr tablet Take 25 mg by mouth in the morning. Hold for low blood pressure    ? Multiple Vitamin (MULTIVITAMIN WITH MINERALS) TABS tablet Take 1 tablet by mouth daily. Centrum Silver for Women 50+    ? multivitamin-lutein (OCUVITE-LUTEIN) CAPS capsule Take 1 capsule by mouth every evening.    ? nitroGLYCERIN (NITROSTAT) 0.4 MG SL tablet Place 0.4 mg under the tongue every 5 (five) minutes x 3 doses as needed for chest pain.    ? Olopatadine HCl 0.2 % SOLN Place 1 drop into both eyes daily.    ? ondansetron (ZOFRAN) 4 MG tablet Take 1 tablet (4 mg total) by mouth every 8 (eight) hours as needed for nausea or vomiting. 20 tablet 0  ? OneTouch Delica Lancets 16X MISC Apply topically.    ? Polyethyl Glycol-Propyl Glycol (ULTRA LUBRICANT EYE DROPS OP) Place 1 drop into both eyes 3 (three) times daily as needed (for dry eyes).     ? Probiotic Product (PROBIOTIC DAILY PO) Take 1 capsule by mouth in the morning.    ? sertraline (ZOLOFT) 50 MG tablet Take 50 mg by mouth in the morning.    ? solifenacin (VESICARE) 10 MG tablet Take 10 mg by mouth in the morning.    ? SUPER B COMPLEX/C PO Take 1 tablet by mouth in the morning.    ? traZODone (DESYREL) 50 MG tablet Take 50 mg by mouth at bedtime as needed for sleep.    ? ?No current facility-administered medications on file prior to visit.  ? ? ?PAST MEDICAL HISTORY: ?Past Medical History:  ?Diagnosis Date  ? Anemia   ? Anginal pain ([place])   ? Anxiety   ? Arthritis   ? Lumbar spine DDD.  ? Breast cancer, female, left 03/03/2012  ? Cardiomyopathy ([place])   ? Carpal tunnel syndrome   ? Bilateral, Mild  ? Cerebral atherosclerosis   ? Chronic sinusitis   ? 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 Diabetes mellitus without complication ([place])   ? metformin  ? Dyspnea   ? When patient gets sick uses Pro-Air inhaler  ? Frequent sinus infections   ? GERD (gastroesophageal reflux disease)   ? occ  ? Goiter   ? History of cardiomegaly   ? History of cataract   ? Bilateral  ? History of kidney stones   ? 08/28/2018 currently has a large kidney stone, asymptomatic at this time  ? History of migraine   ? History of uterine fibroid   ? History of uterine prolapse   ? Hyperlipidemia   ? Hypertension   ? LBBB (left bundle branch block)   ? Menopause 03/03/2012  ? Nasal turbinate hypertrophy 11/29/2016  ? Left inferior   ? Personal  history of chemotherapy   ? Personal history of radiation therapy   ? Pneumonia   ? as a child  ? PONV (postoperative nausea and vomiting)   ? Sinusitis 2014  ? Sleep apnea   ? No CPAP  ? SUI (stress urinary incontinence, female)   ? SVD (spontaneous vaginal delivery)   ? x 2  ? Thyroid nodule   ? ? ?PAST SURGICAL HISTORY: ?Past Surgical History:  ?Procedure Laterality Date  ? ABDOMINAL HYSTERECTOMY    ? BILATERAL SALPINGOOPHORECTOMY Bilateral   ? BLADDER SUSPENSION N/A 05/16/2017  ? Procedure: TRANSVAGINAL TAPE (TVT) PROCEDURE;  Surgeon: Everett Graff, MD;  Location: [facility];  Service: Gynecology;  Laterality: N/A;  ? BREAST BIOPSY Right 2009  ? BREAST LUMPECTOMY Right 2009  ? BREAST LUMPECTOMY Left 2006  ? BREAST RECONSTRUCTION WITH PLACEMENT OF TISSUE EXPANDER AND FLEX HD (ACELLULAR HYDRATED DERMIS) Bilateral 09/04/2021  ? Procedure: BILATERAL BREAST RECONSTRUCTION WITH PLACEMENT OF TISSUE EXPANDER AND FLEX HD (ACELLULAR HYDRATED DERMIS);  Surgeon: [name], MD;  Location: [facility];  Service: Plastics;  Laterality: Bilateral;  ? CARDIAC CATHETERIZATION    ? CATARACT EXTRACTION Left 10/23/2017  ? CATARACT EXTRACTION Right 11/06/2017  ? COLONOSCOPY  10/22/2009  ? Normal.  Repeat 5 years.  Nat [name]  ? CYSTOSCOPY N/A 05/16/2017  ? Procedure: CYSTOSCOPY;  Surgeon: Everett Graff, MD;  Location: [facility];  Service: Gynecology;  Laterality: N/A;  ? DEBRIDEMENT AND CLOSURE WOUND Bilateral 10/03/2021  ? Procedure: Debridement bilateral mastectomy flaps;  Surgeon: [name], MD;  Location: [facility];

## 2022-02-01 ENCOUNTER — Ambulatory Visit (INDEPENDENT_AMBULATORY_CARE_PROVIDER_SITE_OTHER): Payer: Medicare Other | Admitting: Physician Assistant

## 2022-02-01 DIAGNOSIS — Z9889 Other specified postprocedural states: Secondary | ICD-10-CM

## 2022-02-01 NOTE — Progress Notes (Signed)
Patient is a 71 year old female with PMH of breast cancer s/p reconstruction with implant exchange performed 12/19/2021 by [name] who presents to clinic for postoperative follow-up. ? ?Patient was last seen here in clinic on 01/24/2022.  At that time, patient and family were concerned for new incisional wound.  She also was feeling mildly fatigued from her chemotherapy.  1 x 1 cm wound was noted over the left breast incision.  No obvious full-thickness penetration, implant was not visualized.  Sloughy base layer.  Discussed need for possible implant removal and washout in the OR.  Collectively decided against implant replacement.  However, given that there is no obvious full-thickness injury, plan was to continue watchful waiting.  No evidence of infection, antibiotics were not prescribed.  Plan is for her to call the office should she have any new or worsening symptoms, otherwise return in 1 week. ? ?Today, patient is accompanied by family.  She states that she has had some mildly worsening pain symptoms involving right breast.  They are unsure if the wound has made any progress or not.  Patient has been taking ibuprofen 600 mg twice daily for discomfort.  She also has been taking Robaxin at night, but is almost out of her medication.  Denies any redness or fevers. ? ?On physical exam, wound is approximately 1 x 2 cm, doubled in size compared to last visit.  There is slough at the base, mild amount of thin clearish drainage.  No good granular tissue formation.  Still cannot readily visualize the implant.  No significant tenderness or induration.  No erythema.  No fluctuance.  No crepitus. ? ?Wound is worse compared to last encounter, approximately doubled in size.  No granular tissue formation to suggest healing.  She has had radiation to both breasts and is still going through chemotherapy.  She also tells me that she now has to have her Entresto medication adjusted by her cardiologist due to chemo-induced  bradycardia.  Given entire picture in conjunction with lack of wound healing, feel as though conservative management is not working.  We tentatively schedule her for an appointment next week with [name] as a placeholder, but will speak to him before then about possibly scheduling a time in the OR for implant removal. ? ?Patient to call the clinic should she develop any worsening symptoms in interim.  Picture(s) obtained of the patient and placed in the chart were with the patient's or guardian's permission. ? ?

## 2022-02-02 ENCOUNTER — Other Ambulatory Visit: Payer: Self-pay | Admitting: Cardiology

## 2022-02-02 DIAGNOSIS — I428 Other cardiomyopathies: Secondary | ICD-10-CM

## 2022-02-04 ENCOUNTER — Other Ambulatory Visit: Payer: Self-pay

## 2022-02-04 ENCOUNTER — Encounter (HOSPITAL_COMMUNITY): Payer: Self-pay | Admitting: Plastic Surgery

## 2022-02-04 MED ORDER — HYDROCODONE-ACETAMINOPHEN 5-325 MG PO TABS: 1.0000 | ORAL_TABLET | Freq: Four times a day (QID) | ORAL | 0 refills | Status: AC | PRN

## 2022-02-04 NOTE — Telephone Encounter (Signed)
Called for pain meds.  Daughter worried the right breast is opening.  She received radiation in the past to that breast.  Was seen in the office this past week.  Patient to come to the office in the morning.  OR may be needed.  Will add to schedule and we can always cancel if not needed. Family aware. ?

## 2022-02-04 NOTE — Progress Notes (Signed)
PCP - Dr. Audie Pinto ? ?Cardiologist - Dr. Brennan Bailey ? ?EP- Denies ? ?Endocrine- Denies ? ?Pulm- Denies ? ?Chest x-ray - Denies ? ?EKG - 11/27/21 (E) ? ?Stress Test - 02/01/20 (E) ? ?ECHO - 01/29/22 (E) ? ?Cardiac Cath - 02/09/20 (E) ? ?AICD-na ?PM-na ?LOOP-na ? ?Nerve Stimulator- Denies ? ?Dialysis- Denies ? ?Sleep Study - Yes- Positive ?CPAP - Denies ? ?LABS- 02/06/22: CBC, BMP ? ?ASA- LD- 4/16 ? ?ERAS- Yes- clears until 3 hours prior to surgery time. Pt is on the Add- On list. ? ?HA1C- 08/01/21(E): 7.2 ?Fasting Blood Sugar - 97-120 ?Checks Blood Sugar ___1__ times a day ? ?Anesthesia- No ? ?Pt denies having chest pain, sob, or fever during the pre-op phone call. All instructions explained to the pt, with a verbal understanding of the material including: as of today, stop taking all Aspirin (unless instructed by your doctor) and Other Aspirin containing products, Vitamins, Fish oils, and Herbal medications. Also stop all NSAIDS i.e. Advil, Ibuprofen, Motrin, Aleve, Anaprox, Naproxen, BC, Goody Powders, and all Supplements.   ?     ?WHAT DO I DO ABOUT MY DIABETES MEDICATION? ? ?Do not take [place] and Metformin the morning of surgery. ? ?If your CBG is greater than 220 mg/dL, call the number below for further instructions. ? ?How do I manage my blood sugar before surgery? ?Check your blood sugar the morning of your surgery when you wake up and every 2 hours until you get to the Short Stay unit. ?If your blood sugar is less than 70 mg/dL, you will need to treat for low blood sugar: ?Do not take insulin. ?Treat a low blood sugar (less than 70 mg/dL) with ? cup of clear juice (cranberry or apple), 4 glucose tablets, OR glucose gel. ?Recheck blood sugar in 15 minutes after treatment (to make sure it is greater than 70 mg/dL). If your blood sugar is not greater than 70 mg/dL on recheck, call [phone] ? for further instructions. ?Report your blood sugar to the short stay nurse when you get to Short Stay. ? ?Reviewed and  Endorsed by [facility] Patient Education Committee, August 2015 ? ?Pt also instructed to wear a mask and social distance if she goes out. The opportunity to ask questions was provided.  ?

## 2022-02-05 ENCOUNTER — Other Ambulatory Visit: Payer: Self-pay

## 2022-02-05 ENCOUNTER — Inpatient Hospital Stay (HOSPITAL_COMMUNITY): Payer: Medicare Other | Admitting: Anesthesiology

## 2022-02-05 ENCOUNTER — Encounter (HOSPITAL_COMMUNITY): Payer: Self-pay | Admitting: Plastic Surgery

## 2022-02-05 ENCOUNTER — Ambulatory Visit (HOSPITAL_COMMUNITY)
Admission: RE | Admit: 2022-02-05 | Discharge: 2022-02-05 | Disposition: A | Discharge status: Home / Self Care | Payer: Medicare Other | Attending: Plastic Surgery | Admitting: Plastic Surgery

## 2022-02-05 ENCOUNTER — Encounter (HOSPITAL_COMMUNITY)
Admission: RE | Disposition: A | Discharge status: Home / Self Care | Payer: Self-pay | Source: Home / Self Care | Attending: Plastic Surgery

## 2022-02-05 ENCOUNTER — Ambulatory Visit (INDEPENDENT_AMBULATORY_CARE_PROVIDER_SITE_OTHER): Payer: Medicare Other | Admitting: Surgical

## 2022-02-05 DIAGNOSIS — M7989 Other specified soft tissue disorders: Secondary | ICD-10-CM | POA: Diagnosis not present

## 2022-02-05 DIAGNOSIS — Z7984 Long term (current) use of oral hypoglycemic drugs: Secondary | ICD-10-CM

## 2022-02-05 DIAGNOSIS — E119 Type 2 diabetes mellitus without complications: Secondary | ICD-10-CM

## 2022-02-05 DIAGNOSIS — Z923 Personal history of irradiation: Secondary | ICD-10-CM

## 2022-02-05 DIAGNOSIS — Z45811 Encounter for adjustment or removal of right breast implant: Secondary | ICD-10-CM

## 2022-02-05 DIAGNOSIS — K219 Gastro-esophageal reflux disease without esophagitis: Secondary | ICD-10-CM | POA: Insufficient documentation

## 2022-02-05 DIAGNOSIS — I10 Essential (primary) hypertension: Secondary | ICD-10-CM | POA: Insufficient documentation

## 2022-02-05 DIAGNOSIS — X58XXXA Exposure to other specified factors, initial encounter: Secondary | ICD-10-CM | POA: Insufficient documentation

## 2022-02-05 DIAGNOSIS — L905 Scar conditions and fibrosis of skin: Secondary | ICD-10-CM | POA: Diagnosis not present

## 2022-02-05 DIAGNOSIS — Z45812 Encounter for adjustment or removal of left breast implant: Secondary | ICD-10-CM | POA: Diagnosis not present

## 2022-02-05 DIAGNOSIS — Z9889 Other specified postprocedural states: Secondary | ICD-10-CM

## 2022-02-05 DIAGNOSIS — G4730 Sleep apnea, unspecified: Secondary | ICD-10-CM | POA: Diagnosis not present

## 2022-02-05 DIAGNOSIS — Z9013 Acquired absence of bilateral breasts and nipples: Secondary | ICD-10-CM | POA: Diagnosis not present

## 2022-02-05 DIAGNOSIS — L0889 Other specified local infections of the skin and subcutaneous tissue: Secondary | ICD-10-CM | POA: Diagnosis not present

## 2022-02-05 DIAGNOSIS — T8579XA Infection and inflammatory reaction due to other internal prosthetic devices, implants and grafts, initial encounter: Secondary | ICD-10-CM | POA: Insufficient documentation

## 2022-02-05 DIAGNOSIS — T8189XA Other complications of procedures, not elsewhere classified, initial encounter: Secondary | ICD-10-CM | POA: Diagnosis not present

## 2022-02-05 DIAGNOSIS — S21001A Unspecified open wound of right breast, initial encounter: Secondary | ICD-10-CM | POA: Diagnosis not present

## 2022-02-05 DIAGNOSIS — D0511 Intraductal carcinoma in situ of right breast: Secondary | ICD-10-CM

## 2022-02-05 HISTORY — PX: BREAST IMPLANT REMOVAL: SHX5361

## 2022-02-05 LAB — CBC
HCT: 35.1 % — ABNORMAL LOW (ref 36.0–46.0)
Hemoglobin: 10.8 g/dL — ABNORMAL LOW (ref 12.0–15.0)
MCH: 27.1 pg (ref 26.0–34.0)
MCHC: 30.8 g/dL (ref 30.0–36.0)
MCV: 88 fL (ref 80.0–100.0)
Platelets: 315 10*3/uL (ref 150–400)
RBC: 3.99 MIL/uL (ref 3.87–5.11)
RDW: 14.8 % (ref 11.5–15.5)
WBC: 5.2 10*3/uL (ref 4.0–10.5)
nRBC: 0 % (ref 0.0–0.2)

## 2022-02-05 LAB — GLUCOSE, CAPILLARY
Glucose-Capillary: 90 mg/dL (ref 70–99)
Glucose-Capillary: 77 mg/dL (ref 70–99)
Glucose-Capillary: 128 mg/dL — ABNORMAL HIGH (ref 70–99)
Glucose-Capillary: 100 mg/dL — ABNORMAL HIGH (ref 70–99)

## 2022-02-05 LAB — POCT I-STAT, CHEM 8
BUN: 10 mg/dL (ref 8–23)
Calcium, Ion: 1.19 mmol/L (ref 1.15–1.40)
Chloride: 104 mmol/L (ref 98–111)
Creatinine, Ser: 0.6 mg/dL (ref 0.44–1.00)
Glucose, Bld: 80 mg/dL (ref 70–99)
HCT: 37 % (ref 36.0–46.0)
Hemoglobin: 12.6 g/dL (ref 12.0–15.0)
Potassium: 3.6 mmol/L (ref 3.5–5.1)
Sodium: 139 mmol/L (ref 135–145)
TCO2: 27 mmol/L (ref 22–32)

## 2022-02-05 SURGERY — REMOVAL, IMPLANT, BREAST
Anesthesia: General | Site: Breast | Laterality: Bilateral

## 2022-02-05 MED ORDER — SCOPOLAMINE 1 MG/3DAYS TD PT72
1.0000 | MEDICATED_PATCH | TRANSDERMAL | Status: DC
  Administered 2022-02-05: 1.5 mg via TRANSDERMAL

## 2022-02-05 MED ORDER — FENTANYL CITRATE (PF) 250 MCG/5ML IJ SOLN
INTRAMUSCULAR | Status: DC | PRN
Start: 2022-02-05 — End: 2022-02-05
  Administered 2022-02-05: 50 ug via INTRAVENOUS
  Administered 2022-02-05 (×2): 25 ug via INTRAVENOUS
  Administered 2022-02-05: 50 ug via INTRAVENOUS

## 2022-02-05 MED ORDER — SODIUM CHLORIDE 0.9 % IV SOLN
INTRAVENOUS | Status: DC | PRN
  Administered 2022-02-05: 500 mL

## 2022-02-05 MED ORDER — PHENYLEPHRINE 40 MCG/ML (10ML) SYRINGE FOR IV PUSH (FOR BLOOD PRESSURE SUPPORT)
PREFILLED_SYRINGE | INTRAVENOUS | Status: DC | PRN
  Administered 2022-02-05 (×2): 200 ug via INTRAVENOUS
  Administered 2022-02-05 (×2): 80 ug via INTRAVENOUS

## 2022-02-05 MED ORDER — INSULIN ASPART 100 UNIT/ML IJ SOLN: 0.0000 [IU] | INTRAMUSCULAR | Status: DC | PRN

## 2022-02-05 MED ORDER — ONDANSETRON HCL 4 MG/2ML IJ SOLN
INTRAMUSCULAR | Status: AC
  Filled 2022-02-05: qty 2

## 2022-02-05 MED ORDER — EPHEDRINE SULFATE-NACL 50-0.9 MG/10ML-% IV SOSY
PREFILLED_SYRINGE | INTRAVENOUS | Status: DC | PRN
  Administered 2022-02-05 (×2): 5 mg via INTRAVENOUS

## 2022-02-05 MED ORDER — PROPOFOL 10 MG/ML IV BOLUS
INTRAVENOUS | Status: AC
  Filled 2022-02-05: qty 20

## 2022-02-05 MED ORDER — PROPOFOL 10 MG/ML IV BOLUS
INTRAVENOUS | Status: DC | PRN
  Administered 2022-02-05: 50 mg via INTRAVENOUS
  Administered 2022-02-05: 150 mg via INTRAVENOUS
  Administered 2022-02-05: 100 mg via INTRAVENOUS

## 2022-02-05 MED ORDER — CHLORHEXIDINE GLUCONATE 0.12 % MT SOLN
OROMUCOSAL | Status: AC
  Administered 2022-02-05: 15 mL via OROMUCOSAL
  Filled 2022-02-05: qty 15

## 2022-02-05 MED ORDER — FENTANYL CITRATE (PF) 100 MCG/2ML IJ SOLN
INTRAMUSCULAR | Status: AC
  Filled 2022-02-05: qty 2

## 2022-02-05 MED ORDER — ACETAMINOPHEN 325 MG PO TABS
ORAL_TABLET | ORAL | Status: AC
  Administered 2022-02-05: 650 mg via ORAL
  Filled 2022-02-05: qty 2

## 2022-02-05 MED ORDER — FENTANYL CITRATE (PF) 100 MCG/2ML IJ SOLN
25.0000 ug | INTRAMUSCULAR | Status: DC | PRN
  Administered 2022-02-05: 50 ug via INTRAVENOUS
  Administered 2022-02-05: 25 ug via INTRAVENOUS
  Administered 2022-02-05: 50 ug via INTRAVENOUS
  Administered 2022-02-05: 25 ug via INTRAVENOUS

## 2022-02-05 MED ORDER — ACETAMINOPHEN 500 MG PO TABS: 1000.0000 mg | ORAL_TABLET | Freq: Once | ORAL | Status: DC

## 2022-02-05 MED ORDER — SCOPOLAMINE 1 MG/3DAYS TD PT72
MEDICATED_PATCH | TRANSDERMAL | Status: AC
  Filled 2022-02-05: qty 1

## 2022-02-05 MED ORDER — LIDOCAINE 2% (20 MG/ML) 5 ML SYRINGE
INTRAMUSCULAR | Status: DC | PRN
  Administered 2022-02-05: 60 mg via INTRAVENOUS

## 2022-02-05 MED ORDER — OXYCODONE HCL 5 MG PO TABS: 5.0000 mg | ORAL_TABLET | Freq: Three times a day (TID) | ORAL | 0 refills | Status: DC | PRN

## 2022-02-05 MED ORDER — FENTANYL CITRATE (PF) 250 MCG/5ML IJ SOLN
INTRAMUSCULAR | Status: AC
  Filled 2022-02-05: qty 5

## 2022-02-05 MED ORDER — DEXAMETHASONE SODIUM PHOSPHATE 10 MG/ML IJ SOLN
INTRAMUSCULAR | Status: DC | PRN
  Administered 2022-02-05: 5 mg via INTRAVENOUS

## 2022-02-05 MED ORDER — CEFAZOLIN SODIUM-DEXTROSE 2-4 GM/100ML-% IV SOLN
2.0000 g | INTRAVENOUS | Status: AC
  Administered 2022-02-05: 2 g via INTRAVENOUS

## 2022-02-05 MED ORDER — CHLORHEXIDINE GLUCONATE 0.12 % MT SOLN: 15.0000 mL | Freq: Once | OROMUCOSAL | Status: AC

## 2022-02-05 MED ORDER — PHENYLEPHRINE HCL-NACL 20-0.9 MG/250ML-% IV SOLN
INTRAVENOUS | Status: DC | PRN
  Administered 2022-02-05: 40 ug/min via INTRAVENOUS

## 2022-02-05 MED ORDER — ONDANSETRON HCL 4 MG/2ML IJ SOLN
INTRAMUSCULAR | Status: DC | PRN
  Administered 2022-02-05: 4 mg via INTRAVENOUS

## 2022-02-05 MED ORDER — OXYCODONE HCL 5 MG PO TABS
ORAL_TABLET | ORAL | Status: AC
  Filled 2022-02-05: qty 1

## 2022-02-05 MED ORDER — ORAL CARE MOUTH RINSE: 15.0000 mL | Freq: Once | OROMUCOSAL | Status: AC

## 2022-02-05 MED ORDER — DEXAMETHASONE SODIUM PHOSPHATE 10 MG/ML IJ SOLN
INTRAMUSCULAR | Status: AC
  Filled 2022-02-05: qty 1

## 2022-02-05 MED ORDER — LACTATED RINGERS IV SOLN: INTRAVENOUS | Status: DC

## 2022-02-05 MED ORDER — CEFAZOLIN SODIUM-DEXTROSE 2-4 GM/100ML-% IV SOLN
INTRAVENOUS | Status: AC
  Filled 2022-02-05: qty 100

## 2022-02-05 MED ORDER — ACETAMINOPHEN 325 MG PO TABS: 650.0000 mg | ORAL_TABLET | Freq: Once | ORAL | Status: AC

## 2022-02-05 MED ORDER — OXYCODONE HCL 5 MG PO TABS
5.0000 mg | ORAL_TABLET | Freq: Once | ORAL | Status: AC
  Administered 2022-02-05: 5 mg via ORAL

## 2022-02-05 SURGICAL SUPPLY — 12 items
BINDER BREAST LRG (GAUZE/BANDAGES/DRESSINGS) ×2 IMPLANT
BIOPATCH RED 1 DISK 7.0 (GAUZE/BANDAGES/DRESSINGS) ×4 IMPLANT
DRAIN CHANNEL 15F RND FF W/TCR (WOUND CARE) ×4 IMPLANT
DRAPE LAPAROSCOPIC ABDOMINAL (DRAPES) ×2 IMPLANT
DRSG PAD ABDOMINAL 8X10 ST (GAUZE/BANDAGES/DRESSINGS) ×4 IMPLANT
EVACUATOR SILICONE 100CC (DRAIN) ×4 IMPLANT
GAUZE SPONGE 4X4 12PLY STRL (GAUZE/BANDAGES/DRESSINGS) ×2 IMPLANT
KIT BASIN OR (CUSTOM PROCEDURE TRAY) ×2 IMPLANT
PACK GENERAL/GYN (CUSTOM PROCEDURE TRAY) ×2 IMPLANT
PIN SAFETY STERILE (MISCELLANEOUS) ×2 IMPLANT
SUT ETHILON 3 0 PS 1 (SUTURE) ×4 IMPLANT
SUT PDS AB 3-0 SH 27 (SUTURE) ×4 IMPLANT

## 2022-02-05 NOTE — Transfer of Care (Signed)
Immediate Anesthesia Transfer of Care Note ? ?Patient: Jordan Willis ? ?Procedure(s) Performed: Removal bilateral breast implants (Bilateral: Breast) ? ?Patient Location: PACU ? ?Anesthesia Type:General ? ?Level of Consciousness: drowsy and patient cooperative ? ?Airway & Oxygen Therapy: Patient Spontanous Breathing and Patient connected to face mask oxygen ? ?Post-op Assessment: Report given to RN and Post -op Vital signs reviewed and stable ? ?Post vital signs: Reviewed and stable ? ?Last Vitals:  ?Vitals Value Taken Time  ?BP 144/68 02/05/22 1553  ?Temp    ?Pulse 90 02/05/22 1558  ?Resp 18 02/05/22 1558  ?SpO2 94 % 02/05/22 1558  ?Vitals shown include unvalidated device data. ? ?Last Pain:  ?Vitals:  ? 02/05/22 1242  ?TempSrc:   ?PainSc: 0-No pain  ?   ? ?Patients Stated Pain Goal: 0 (02/04/22 1819) ? ?Complications: No notable events documented. ?

## 2022-02-05 NOTE — Progress Notes (Signed)
Patient is a 71 year old female here for evaluation of her right breast.  She has a history of bilateral mastectomies, placement of bilateral breast tissue expanders and subsequent placement of bilateral breast implants.  She is currently undergoing chemotherapy treatment with trastuzumab every 3 weeks.  She also has a history of radiation to her right breast in 2009.  Also history of radiation to the left breast in 2006-2007. ? ?She presents today with her daughter.  Over the weekend she was not feeling well, right breast wound has worsened.  She would like to have her bilateral implants removed.  She has been n.p.o. today with the exception of some water per RN instructions at [facility]. ? ?She would like to have bilateral breast implants removed.  We discussed that there is a risk of ongoing wounds due to her history of radiation and current chemotherapy treatment.  We discussed that she would likely have bilateral JP drains placed. ?Discussed patient's case with [name] who will plan to remove this afternoon.  She is planning to arrive at the hospital at 1030am. ? ?Pictures were obtained of the patient and placed in the chart with the patient's or guardian's permission. ? ? ?

## 2022-02-05 NOTE — Anesthesia Procedure Notes (Signed)
Procedure Name: LMA Insertion ?Date/Time: 02/05/2022 2:43 PM ?Performed by: Janene Harvey, CRNA ?Pre-anesthesia Checklist: Patient identified, Emergency Drugs available, Suction available and Patient being monitored ?Patient Re-evaluated:Patient Re-evaluated prior to induction ?Oxygen Delivery Method: Circle system utilized ?Preoxygenation: Pre-oxygenation with 100% oxygen ?Induction Type: IV induction ?Ventilation: Mask ventilation without difficulty ?LMA: LMA inserted ?LMA Size: 3.0 ?Placement Confirmation: positive ETCO2 ?Dental Injury: Teeth and Oropharynx as per pre-operative assessment  ? ? ? ? ?

## 2022-02-05 NOTE — Anesthesia Preprocedure Evaluation (Addendum)
Anesthesia Evaluation  ?Patient identified by MRN, date of birth, ID band ?Patient awake ? ? ? ?Reviewed: ?Allergy & Precautions, NPO status , Patient's Chart, lab work & pertinent test results, reviewed documented beta blocker date and time  ? ?History of Anesthesia Complications ?(+) PONV and history of anesthetic complications ? ?Airway ?Mallampati: II ? ?TM Distance: >3 FB ?Neck ROM: Full ? ? ? Dental ?no notable dental hx. ?(+) Teeth Intact, Dental Advisory Given ?  ?Pulmonary ?shortness of breath, sleep apnea (does not use CPAP) ,  ?  ?Pulmonary exam normal ?breath sounds clear to auscultation ? ? ? ? ? ? Cardiovascular ?hypertension, Pt. on home beta blockers and Pt. on medications ?+ angina Normal cardiovascular exam+ Valvular Problems/Murmurs (mod/severe MR) MR  ?Rhythm:Regular Rate:Normal ? ?EKG LBBB ? ?TTE 2023 ??1. The average global longitudinal strain is -10.3 %  ??? ?The global longitudinal strain is abnormal and shows further decrease  ?from 10/09/2021 study. Left ventricular ejection fraction, by estimation,  ?is 35 to 40%. The left ventricle has moderately decreased function. The  ?left ventricle demonstrates  ?regional wall motion abnormalities (see scoring diagram/findings for  ?description). Left ventricular diastolic parameters are consistent with  ?Grade I diastolic dysfunction (impaired relaxation). There is severe  ?hypokinesis of the left ventricular, entire  ?anteroseptal wall. There is mild hypokinesis of the left ventricular,  ?entire inferior wall and lateral wall.  ??2. Right ventricular systolic function is normal. The right ventricular  ?size is normal. There is mildly elevated pulmonary artery systolic  ?pressure.  ??3. Left atrial size was mild to moderately dilated.  ??4. The pericardial effusion is posterior to the left ventricle. There is  ?no evidence of cardiac tamponade.  ??5. The mitral valve is degenerative. Moderate to severe mitral valve   ?regurgitation.  ??6. The aortic valve is tricuspid. There is mild calcification of the  ?aortic valve. There is mild thickening of the aortic valve. Aortic valve  ?regurgitation is not visualized. Aortic valve sclerosis is present, with  ?no evidence of aortic valve stenosis.  ??7. The inferior vena cava is normal in size with <50% respiratory  ?variability, suggesting right atrial pressure of 8 mmHg.  ? ?Cath 2021 ?Right dominant circulation ?No angiographically significant coronary artery disease ?Normal LVEDP ? ?  ?Neuro/Psych ? Headaches, PSYCHIATRIC DISORDERS Anxiety   ? GI/Hepatic ?Neg liver ROS, GERD  ,  ?Endo/Other  ?diabetes, Type 2, Oral Hypoglycemic Agents ? Renal/GU ?negative Renal ROS  ?negative genitourinary ?  ?Musculoskeletal ? ?(+) Arthritis ,  ? Abdominal ?  ?Peds ? Hematology ?negative hematology ROS ?(+)   ?Anesthesia Other Findings ? ? Reproductive/Obstetrics ? ?  ? ? ? ? ? ? ? ? ? ? ? ? ? ?  ?  ? ? ? ? ? ? ? ?Anesthesia Physical ?Anesthesia Plan ? ?ASA: 3 ? ?Anesthesia Plan: General  ? ?Post-op Pain Management: Tylenol PO (pre-op)* and Toradol IV (intra-op)*  ? ?Induction: Intravenous ? ?PONV Risk Score and Plan: 4 or greater and Midazolam, Dexamethasone, Ondansetron, Scopolamine patch - Pre-op and Treatment may vary due to age or medical condition ? ?Airway Management Planned: Oral ETT and LMA ? ?Additional Equipment:  ? ?Intra-op Plan:  ? ?Post-operative Plan: Extubation in OR ? ?Informed Consent: I have reviewed the patients History and Physical, chart, labs and discussed the procedure including the risks, benefits and alternatives for the proposed anesthesia with the patient or authorized representative who has indicated his/her understanding and acceptance.  ? ? ? ?Dental advisory  given ? ?Plan Discussed with: CRNA ? ?Anesthesia Plan Comments:   ? ? ? ? ? ?Anesthesia Quick Evaluation ? ?

## 2022-02-05 NOTE — H&P (View-Only) (Signed)
Patient is a 71 year old female here for evaluation of her right breast.  She has a history of bilateral mastectomies, placement of bilateral breast tissue expanders and subsequent placement of bilateral breast implants.  She is currently undergoing chemotherapy treatment with trastuzumab every 3 weeks.  She also has a history of radiation to her right breast in 2009.  Also history of radiation to the left breast in 2006-2007. ? ?She presents today with her daughter.  Over the weekend she was not feeling well, right breast wound has worsened.  She would like to have her bilateral implants removed.  She has been n.p.o. today with the exception of some water per RN instructions at [facility]. ? ?She would like to have bilateral breast implants removed.  We discussed that there is a risk of ongoing wounds due to her history of radiation and current chemotherapy treatment.  We discussed that she would likely have bilateral JP drains placed. ?Discussed patient's case with [name] who will plan to remove this afternoon.  She is planning to arrive at the hospital at 1030am. ? ?Pictures were obtained of the patient and placed in the chart with the patient's or guardian's permission. ? ? ?

## 2022-02-05 NOTE — Discharge Instructions (Signed)
Activity: Avoid strenuous activity.  No heavy lifting. ? ?Diet: No restrictions.  Try to optimize nutrition with plenty of fruits and vegetables to improve healing. ? ?Wound Care: Leave breast binder for 3 days.  You may then remove and shower normally.  After breast binder comes off recommend sports bra for gentle compression.  Avoid any bra with under-wire until cleared by [name]. ? ?Follow-Up: Scheduled for next week. ? ?Things to watch for:  Call the office if you experience fever, chills, persistent nausea, or significant bleeding.  Mild wound drainage is common after breast surgery and should not be cause for alarm. ? ?

## 2022-02-05 NOTE — Interval H&P Note (Signed)
History and Physical Interval Note: ? ?02/05/2022 ?1:56 PM ? ?Jordan Willis  has presented today for surgery, with the diagnosis of right breast wound.  The various methods of treatment have been discussed with the patient and family. After consideration of risks, benefits and other options for treatment, the patient has consented to  Procedure(s): ?RIGHT BREAST WOUND VS. REVISION OF IMPLANT (Right) as a surgical intervention.  The patient's history has been reviewed, patient examined, no change in status, stable for surgery.  I have reviewed the patient's chart and labs.  Questions were answered to the patient's satisfaction.   ? ? ?Jordan Willis ? ? ?

## 2022-02-05 NOTE — Op Note (Signed)
Operative Note  ? ?DATE OF OPERATION: 02/05/2022 ? ?SURGICAL DEPARTMENT: Plastic Surgery ? ?PREOPERATIVE DIAGNOSES: Right breast wound ? ?POSTOPERATIVE DIAGNOSES:  same ? ?PROCEDURE: Removal of bilateral breast implants ? ?SURGEON: Talmadge Coventry, MD ? ?ASSISTANT: Krista Blue, PA ?The advanced practice practitioner (APP) assisted throughout the case.  The APP was essential in retraction and counter traction when needed to make the case progress smoothly.  This retraction and assistance made it possible to see the tissue planes for the procedure.  The assistance was needed for hemostasis, tissue re-approximation and closure of the incision site.  ? ?ANESTHESIA:  General.  ? ?COMPLICATIONS: None.  ? ?INDICATIONS FOR PROCEDURE:  ?The patient, Jordan Willis is a 71 y.o. female born on [birth year], is here for treatment of nonhealing wound after placement of implants for breast reconstruction ?MRN: [id number] ? ?CONSENT:  ?Informed consent was obtained directly from the patient. Risks, benefits and alternatives were fully discussed. Specific risks including but not limited to bleeding, infection, hematoma, seroma, scarring, pain, contracture, asymmetry, wound healing problems, and need for further surgery were all discussed. The patient did have an ample opportunity to have questions answered to satisfaction.  ? ?DESCRIPTION OF PROCEDURE:  ?The patient was taken to the operating room. SCDs were placed and antibiotics were given.  General anesthesia was administered.  The patient's operative site was prepped and draped in a sterile fashion. A time out was performed and all information was confirmed to be correct.  Started by evaluating the chest.  She had a nonhealing wound on the right side with impending exposure of the implant.  Patient elected to have both implants removed so that she could avoid any further surgeries.  Elliptical excisions were planned around the incisions on both sides.  Skin was excised on the  right and then the left with a 10 blade.  This was sent as specimen.  Hemostasis was ensured and the soft tissues.  Both implants were removed in their entirety.  Pockets were inspected and there was no purulence or any signs of infection.  Capsules are intact and appeared clean without any inflammatory rind.  Antibiotic irrigation was infiltrated on both sides.  15 French drains were placed on each side and secured with nylon suture.  Skin was then advanced and closed in layers with interrupted buried 3-0 PDS in a running 3-0 PDS suture.  Soft compressive dressing was applied. ? ?The patient tolerated the procedure well.  There were no complications. The patient was allowed to wake from anesthesia, extubated and taken to the recovery room in satisfactory condition.  ? ?

## 2022-02-06 ENCOUNTER — Encounter (HOSPITAL_COMMUNITY): Payer: Self-pay | Admitting: Plastic Surgery

## 2022-02-06 NOTE — Anesthesia Postprocedure Evaluation (Signed)
Anesthesia Post Note ? ?Patient: Jordan Willis ? ?Procedure(s) Performed: Removal bilateral breast implants (Bilateral: Breast) ? ?  ? ?Patient location during evaluation: PACU ?Anesthesia Type: General ?Level of consciousness: awake and alert ?Pain management: pain level controlled ?Vital Signs Assessment: post-procedure vital signs reviewed and stable ?Respiratory status: spontaneous breathing, nonlabored ventilation, respiratory function stable and patient connected to nasal cannula oxygen ?Cardiovascular status: blood pressure returned to baseline and stable ?Postop Assessment: no apparent nausea or vomiting ?Anesthetic complications: no ? ? ?No notable events documented. ? ?Last Vitals:  ?Vitals:  ? 02/05/22 1643 02/05/22 1655  ?BP:  (!) 151/69  ?Pulse: 82 79  ?Resp: (!) 22 20  ?Temp:  36.5 ?C  ?SpO2: 98% 97%  ?  ?Last Pain:  ?Vitals:  ? 02/05/22 1647  ?TempSrc:   ?PainSc: Asleep  ? ? ?  ?  ?  ?  ?  ?  ? ?Ellanie Oppedisano L Telecia Larocque ? ? ? ? ?

## 2022-02-07 ENCOUNTER — Ambulatory Visit (INDEPENDENT_AMBULATORY_CARE_PROVIDER_SITE_OTHER): Payer: Medicare Other | Admitting: Plastic Surgery

## 2022-02-07 ENCOUNTER — Encounter: Payer: Self-pay | Admitting: Plastic Surgery

## 2022-02-07 DIAGNOSIS — Z9889 Other specified postprocedural states: Secondary | ICD-10-CM

## 2022-02-07 LAB — SURGICAL PATHOLOGY

## 2022-02-07 NOTE — Progress Notes (Signed)
Patient presents several days postop from removal of bilateral breast implants for an exposure on the right.  She feels like things are going well.  Intermittently has trouble with left sided drain holding a charge.  It is holding for now.  On exam her incisions look intact and I do not see any signs of subcutaneous fluid or any problem.  Drain output has been 30 to 50 cc/day.  Overall everything looks to be going well and we will see her next week and hopefully remove 1 or both of her drains.  All of her questions were answered. ?

## 2022-02-12 ENCOUNTER — Other Ambulatory Visit: Payer: Self-pay

## 2022-02-12 ENCOUNTER — Telehealth: Payer: Self-pay | Admitting: *Deleted

## 2022-02-12 DIAGNOSIS — I428 Other cardiomyopathies: Secondary | ICD-10-CM

## 2022-02-12 NOTE — Telephone Encounter (Signed)
Per MD pt EF too low to continue trastuzumab at this time.  Upcoming appts canceled.  Pt notified and states she is schedule to f/u with her Cardiologist Dr. Terri Skains in 6 weeks.  Pt states she will contact Dr. Brennan Bailey office to see if they can preform an echo there or if we will need to order one in 6 weeks.  Pt states she will keep our office updated.  ?

## 2022-02-12 NOTE — H&P (Signed)
Reason for Consult/CC: Right breast wound  Jordan Willis is an 71 y.o. female.  HPI: Patient presents with a bending exposure of the right breast implant.  She elected to remove both breast implants.  She is here for surgery.  Allergies:  Allergies  Allergen Reactions   Bactrim [Sulfamethoxazole-Trimethoprim] Shortness Of Breath    Shortness of breath, difficulty breathing, headache, rash, fatigue   Aspirin Palpitations and Other (See Comments)    Heart fluttering, pt takes 40.5 mg (half of 81 mg) daily   Tape Rash    Tears skin off    Medications: No current facility-administered medications for this encounter.  Current Outpatient Medications:    acetaminophen (TYLENOL) 500 MG tablet, Take 1,000 mg by mouth every 6 (six) hours as needed for moderate pain., Disp: , Rfl:    Ascorbic Acid (VITAMIN C PO), Take 1 capsule by mouth in the morning., Disp: , Rfl:    aspirin 81 MG EC tablet, Take 40.5 mg by mouth in the morning. Swallow whole., Disp: , Rfl:    atorvastatin (LIPITOR) 20 MG tablet, TAKE 1 TABLET BY MOUTH EVERYDAY AT BEDTIME (Patient taking differently: Take 20 mg by mouth at bedtime.), Disp: 90 tablet, Rfl: 1   cetirizine (ZYRTEC) 10 MG tablet, Take 10 mg by mouth in the morning., Disp: , Rfl:    Cholecalciferol (VITAMIN D3) 25 MCG (1000 UT) CAPS, Take 1,000 Units by mouth in the morning., Disp: , Rfl:    FARXIGA 10 MG TABS tablet, TAKE 1 TABLET BY MOUTH DAILY BEFORE BREAKFAST. (Patient taking differently: Take 10 mg by mouth daily.), Disp: 90 tablet, Rfl: 0   fluticasone (FLONASE) 50 MCG/ACT nasal spray, Place 2 sprays into both nostrils in the morning., Disp: , Rfl:    gabapentin (NEURONTIN) 100 MG capsule, Take 1 capsule (100 mg total) by mouth 2 (two) times daily. (Patient taking differently: Take 100 mg by mouth 2 (two) times daily as needed (muscle spasms).), Disp: 30 capsule, Rfl: 1   GLUCOSAMINE-CHONDROITIN PO, Take 1 tablet by mouth every evening. , Disp: , Rfl:     ibuprofen (ADVIL) 200 MG tablet, Take 400 mg by mouth every 6 (six) hours as needed (for pain.)., Disp: , Rfl:    metFORMIN (GLUCOPHAGE-XR) 500 MG 24 hr tablet, Take 500 mg by mouth 2 (two) times daily., Disp: , Rfl:    methocarbamol (ROBAXIN) 500 MG tablet, Take 1 tablet (500 mg total) by mouth every 8 (eight) hours as needed for muscle spasms., Disp: 20 tablet, Rfl: 0   metoprolol succinate (TOPROL-XL) 25 MG 24 hr tablet, TAKE 1 TABLET (25 MG TOTAL) BY MOUTH IN THE MORNING. HOLD IF SYSTOLIC BLOOD PRESSURE (TOP BLOOD PRESSURE NUMBER) LESS THAN 100 MMHG OR HEART RATE LESS THAN 60 BPM (PULSE). (Patient taking differently: Take 25 mg by mouth daily. Hold if systolic blood pressure (top blood pressure number) is less than 100 MMHG or heart rate less than 60 BPM (pulse)), Disp: 90 tablet, Rfl: 3   Multiple Vitamin (MULTIVITAMIN WITH MINERALS) TABS tablet, Take 1 tablet by mouth daily. Centrum Silver for Women 50+, Disp: , Rfl:    Olopatadine HCl 0.2 % SOLN, Place 1 drop into both eyes daily., Disp: , Rfl:    ondansetron (ZOFRAN) 4 MG tablet, Take 1 tablet (4 mg total) by mouth every 8 (eight) hours as needed for nausea or vomiting., Disp: 20 tablet, Rfl: 0   oxyCODONE (ROXICODONE) 5 MG immediate release tablet, Take 1 tablet (5 mg total) by mouth  every 8 (eight) hours as needed., Disp: 20 tablet, Rfl: 0   Polyethyl Glycol-Propyl Glycol (ULTRA LUBRICANT EYE DROPS OP), Place 1 drop into both eyes 3 (three) times daily as needed (for dry eyes). , Disp: , Rfl:    Probiotic Product (PROBIOTIC DAILY PO), Take 1 capsule by mouth in the morning., Disp: , Rfl:    sacubitril-valsartan (ENTRESTO) 97-103 MG, Take 1 tablet by mouth 2 (two) times daily. (Patient taking differently: Take 2 tablets by mouth 2 (two) times daily.), Disp: 180 tablet, Rfl: 0   sertraline (ZOLOFT) 50 MG tablet, Take 50 mg by mouth in the morning., Disp: , Rfl:    solifenacin (VESICARE) 10 MG tablet, Take 10 mg by mouth in the morning., Disp: ,  Rfl:    SUPER B COMPLEX/C PO, Take 1 tablet by mouth in the morning., Disp: , Rfl:    traZODone (DESYREL) 50 MG tablet, Take 50 mg by mouth at bedtime as needed for sleep., Disp: , Rfl:    glucose blood (ONETOUCH VERIO) test strip, TEST ONCE DAILY AS DIRECTED 90, Disp: , Rfl:    multivitamin-lutein (OCUVITE-LUTEIN) CAPS capsule, Take 1 capsule by mouth every evening., Disp: , Rfl:    nitroGLYCERIN (NITROSTAT) 0.4 MG SL tablet, Place 0.4 mg under the tongue every 5 (five) minutes x 3 doses as needed for chest pain., Disp: , Rfl:    OneTouch Delica Lancets 22L MISC, Apply topically., Disp: , Rfl:   Past Medical History:  Diagnosis Date   Anemia    Anginal pain (HCC)    Anxiety    Arthritis    Lumbar spine DDD.   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   Diabetes mellitus without complication (HCC)    Type II   Dyspnea    When patient gets sick uses Pro-Air inhaler   Frequent sinus infections    GERD (gastroesophageal reflux disease)    occ   Goiter    History of cardiomegaly    History of cataract    Bilateral   History of kidney stones    08/28/2018 currently has a large kidney stone, asymptomatic at this time   History of migraine    History of uterine fibroid    History of uterine prolapse    Hyperlipidemia    Hypertension    LBBB (left bundle branch block)    Menopause 03/03/2012   Nasal turbinate hypertrophy 11/29/2016   Left inferior    Personal history of chemotherapy    Personal history of radiation therapy    Pneumonia    as a child   PONV (postoperative nausea and vomiting)    Sinusitis 2014   Sleep apnea    No CPAP   SUI (stress urinary incontinence, female)    SVD (spontaneous vaginal delivery)    x 2   Thyroid nodule     Past Surgical History:  Procedure Laterality Date    ABDOMINAL HYSTERECTOMY     BILATERAL SALPINGOOPHORECTOMY Bilateral    BLADDER SUSPENSION N/A 05/16/2017   Procedure: TRANSVAGINAL TAPE (TVT) PROCEDURE;  Surgeon: Everett Graff, MD;  Location: [facility];  Service: Gynecology;  Laterality: N/A;   BREAST BIOPSY Right 2009   BREAST IMPLANT REMOVAL Bilateral 02/05/2022   Procedure: Removal bilateral breast implants;  Surgeon: [name], MD;  Location: [facility];  Service: Plastics;  Laterality: Bilateral;   BREAST LUMPECTOMY Right 2009   BREAST LUMPECTOMY Left 2006   BREAST RECONSTRUCTION WITH PLACEMENT OF TISSUE EXPANDER AND FLEX HD (ACELLULAR HYDRATED DERMIS) Bilateral 09/04/2021   Procedure: BILATERAL BREAST RECONSTRUCTION WITH PLACEMENT OF TISSUE EXPANDER AND FLEX HD (ACELLULAR HYDRATED DERMIS);  Surgeon: [name], MD;  Location: [facility];  Service: Plastics;  Laterality: Bilateral;   CARDIAC CATHETERIZATION     CATARACT EXTRACTION Left 10/23/2017   CATARACT EXTRACTION Right 11/06/2017   COLONOSCOPY  10/22/2009   Normal.  Repeat 5 years.  [name]   CYSTOSCOPY N/A 05/16/2017   Procedure: CYSTOSCOPY;  Surgeon: Everett Graff, MD;  Location: [facility];  Service: Gynecology;  Laterality: N/A;   DEBRIDEMENT AND CLOSURE WOUND Bilateral 10/03/2021   Procedure: Debridement bilateral mastectomy flaps;  Surgeon: [name], MD;  Location: [facility];  Service: Plastics;  Laterality: Bilateral;  1.5 hour   EYE SURGERY     bilateral cataracts   LEFT HEART CATH AND CORONARY ANGIOGRAPHY N/A 02/09/2020   Procedure: LEFT HEART CATH AND CORONARY ANGIOGRAPHY;  Surgeon: Nigel Mormon, MD;  Location: [facility];  Service: Cardiovascular;  Laterality: N/A;   MASTECTOMY W/ SENTINEL NODE BIOPSY Right 09/04/2021   Procedure: BILATERAL MASTECTOMIES WITH RIGHT SENTINEL LYMPH NODE BIOPSY;  Surgeon: Stark Klein, MD;  Location: [facility];  Service: General;  Laterality: Right;   REMOVAL OF BILATERAL TISSUE EXPANDERS WITH PLACEMENT OF BILATERAL BREAST  IMPLANTS Bilateral 10/03/2021   Procedure: REMOVAL OF BILATERAL TISSUE EXPANDERS WITH REPLACEMENT OF TISSUE EXPANDERS;  Surgeon: [name], MD;  Location: [facility];  Service: Plastics;  Laterality: Bilateral;   REMOVAL OF BILATERAL TISSUE EXPANDERS WITH PLACEMENT OF BILATERAL BREAST IMPLANTS Bilateral 12/19/2021   Procedure: REMOVAL OF BILATERAL TISSUE EXPANDERS WITH PLACEMENT OF BILATERAL BREAST IMPLANTS;  Surgeon: [name], MD;  Location: [facility];  Service: Plastics;  Laterality: Bilateral;   ROBOTIC ASSISTED TOTAL HYSTERECTOMY N/A 05/16/2017   Procedure: ROBOTIC ASSISTED TOTAL HYSTERECTOMY;  Surgeon: Delsa Bern, MD;  Location: [facility];  Service: Gynecology;  Laterality: N/A;   ROTATOR CUFF REPAIR Bilateral    SHOULDER ARTHROSCOPY WITH ROTATOR CUFF REPAIR AND SUBACROMIAL DECOMPRESSION Right 09/03/2018   Procedure: Right shoulder manipulation under anesthesia, exam under anesthesia, mini open rotator cuff repair, subacromial decompression;  Surgeon: Susa Day, MD;  Location: [facility];  Service: Orthopedics;  Laterality: Right;  90 mins   TUBAL LIGATION     WISDOM TOOTH EXTRACTION      Family History  Problem Relation Age of Onset   Cancer Mother 22       breast cancer   Hypertension Mother    Diabetes Father    Cancer Sister        breast cancer   Breast cancer Sister    Cancer Sister        breast cancer   Breast cancer Sister     Social History:  reports that she has never smoked. She has never used smokeless tobacco. She reports that she does not drink alcohol and does not use drugs.  Physical Exam Blood pressure (!) 151/69, pulse 79, temperature 97.7 F (36.5 C), resp. rate 20, height 5' 7.5" (1.715 m), weight 84.8 kg, SpO2 97 %. General: No acute distress Cardiovascular: Regular rhythm Pulmonary: Unlabored Breast: Right breast wound 3 to 4 cm in length  No results found for this or any previous visit (from the past 48 hour(s)).  No  results  found.  Assessment/Plan: Patient presents for removal bilateral breast implants.  We discussed risks include bleeding, infection, damage to surrounding structures need for additional procedures.  All of her questions were answered and she is interested in moving forward.  [name] 02/12/2022, 1:03 PM

## 2022-02-13 ENCOUNTER — Inpatient Hospital Stay: Payer: Medicare Other

## 2022-02-13 ENCOUNTER — Telehealth: Payer: Self-pay | Admitting: Adult Health

## 2022-02-13 ENCOUNTER — Telehealth: Payer: Self-pay

## 2022-02-13 ENCOUNTER — Encounter: Payer: Self-pay | Admitting: *Deleted

## 2022-02-13 ENCOUNTER — Other Ambulatory Visit: Payer: Self-pay | Admitting: *Deleted

## 2022-02-13 DIAGNOSIS — Z5181 Encounter for therapeutic drug level monitoring: Secondary | ICD-10-CM

## 2022-02-13 NOTE — Telephone Encounter (Signed)
Per 4/25 in basket called and spoke to pt about appointment . Pt confirmed appointment  ?

## 2022-02-13 NOTE — Progress Notes (Signed)
Received call from RN with Dr. Terri Skains requesting our office order repeat echo in 6 weeks.  Verbal orders received by MD and placed.  Pt educated and verbalized understanding.  ?

## 2022-02-13 NOTE — Telephone Encounter (Signed)
You can have them order the repeat echo as per their protocol.  ? ?Dr. Terri Skains

## 2022-02-13 NOTE — Progress Notes (Signed)
Patient is a 71 year old female with PMH of breast cancer s/p reconstruction with implant exchange performed 12/19/2021 and subsequent removal of bilateral implants performed 02/05/2022 by [name] due to right side exposure who presents to clinic for postoperative follow-up. ? ?She was seen here in clinic on 02/07/2022.  At that time, she felt as though things were going well.  She initially had trouble with the left-sided drain holding a charge, but was functioning at time of exam.  Incisions appeared intact and there is no evidence of subcutaneous fluid.  Drain output was fluctuating between 30 and 50 cc/day.  Plan was for her to return in 1 week for removal of 1 if not both of her drains. ? ?Today, patient is doing well.  She is accompanied by her family at bedside.  She reports scant drainage since last encounter, less than 20 cc/day from each drain.  Patient is hoping they can be removed today.  She states that her pain symptoms are improving.  Denies any wound dehiscence, redness, swelling, or other concerning symptoms. ? ?Physical exam is reassuring.  Running 3-0 PDS suture remains intact over each mastectomy incision.  No wounds or dehiscence noted.  No subcutaneous fluid collections noted.  No erythema or swelling. ? ?JP drains removed without complication or difficulty at bedside.  Drain tube insertion sites covered with Vaseline and gauze, secured with Medipore tape. ? ?She already has prostheses from [facility].  She is pleased that she is finally recovering well.  Discussed removing PDS sutures, but will abstain for another couple of weeks given her history of wounds and poor healing in context of radiation treatments. ? ?Picture(s) obtained of the patient and placed in the chart were with the patient's or guardian's permission. ? ?

## 2022-02-13 NOTE — Progress Notes (Signed)
Per MD request, RN placed call and LVM for pt cardiologist Dr. Terri Skains regarding pts hold on trastuzumab while EF is decreased. Requested office to return call with recommendations if Dr. Terri Skains will be preforming a repeat echo in 6 weeks or if our office needed to place the orders.  ?

## 2022-02-15 ENCOUNTER — Ambulatory Visit (INDEPENDENT_AMBULATORY_CARE_PROVIDER_SITE_OTHER): Payer: Medicare Other | Admitting: Physician Assistant

## 2022-02-15 ENCOUNTER — Encounter: Payer: Self-pay | Admitting: Hematology and Oncology

## 2022-02-15 DIAGNOSIS — Z9889 Other specified postprocedural states: Secondary | ICD-10-CM

## 2022-02-16 ENCOUNTER — Other Ambulatory Visit: Payer: Self-pay

## 2022-02-16 ENCOUNTER — Telehealth: Payer: Self-pay

## 2022-02-16 DIAGNOSIS — I5042 Chronic combined systolic (congestive) and diastolic (congestive) heart failure: Secondary | ICD-10-CM

## 2022-02-16 DIAGNOSIS — I428 Other cardiomyopathies: Secondary | ICD-10-CM | POA: Diagnosis not present

## 2022-02-16 DIAGNOSIS — I10 Essential (primary) hypertension: Secondary | ICD-10-CM

## 2022-02-16 MED ORDER — BUMETANIDE 1 MG PO TABS: 1.0000 mg | ORAL_TABLET | Freq: Every day | ORAL | 0 refills | Status: DC

## 2022-02-16 NOTE — Telephone Encounter (Signed)
Called pt and spoke to her and daughter to inform her about the message above. They both understood

## 2022-02-16 NOTE — Telephone Encounter (Signed)
Reduce the Entresto 97/'103mg'$  HALF tablet bid.  ?Start bumex '1mg'$  po prn daily for weight gain - hold w/ SBP <139mHg.  ?Labs in 1 week ?She may stop Bumex when she is back to her regular weight. ?Call if any questions or concerns arise. ? ?SRex Kras DO, FACC ?

## 2022-02-16 NOTE — Telephone Encounter (Signed)
Pt called to inform us that she has gained 7 lbs since Sunday. Pt denies SOB, CP, dizziness, swelling.  ?Pt mention she has been tired. Pt mention her bp has been 90/56 on Tuesday, 98/59 on Wednesday and this morning it was 125/70. Pt mention that her chemo was canceled due of her heart rate being low 40s.  ?

## 2022-02-17 ENCOUNTER — Encounter: Payer: Self-pay | Admitting: Hematology and Oncology

## 2022-02-17 LAB — BASIC METABOLIC PANEL
BUN/Creatinine Ratio: 17 (ref 12–28)
BUN: 11 mg/dL (ref 8–27)
CO2: 24 mmol/L (ref 20–29)
Calcium: 9.9 mg/dL (ref 8.7–10.3)
Chloride: 103 mmol/L (ref 96–106)
Creatinine, Ser: 0.65 mg/dL (ref 0.57–1.00)
Glucose: 108 mg/dL — ABNORMAL HIGH (ref 70–99)
Potassium: 4.6 mmol/L (ref 3.5–5.2)
Sodium: 139 mmol/L (ref 134–144)
eGFR: 95 mL/min/{1.73_m2} (ref >59)

## 2022-02-17 LAB — MAGNESIUM: Magnesium: 2.2 mg/dL (ref 1.6–2.3)

## 2022-02-17 LAB — PRO B NATRIURETIC PEPTIDE: NT-Pro BNP: 239 pg/mL (ref 0–301)

## 2022-02-23 DIAGNOSIS — I110 Hypertensive heart disease with heart failure: Secondary | ICD-10-CM | POA: Diagnosis not present

## 2022-02-23 DIAGNOSIS — Z0000 Encounter for general adult medical examination without abnormal findings: Secondary | ICD-10-CM | POA: Diagnosis not present

## 2022-02-23 DIAGNOSIS — M79675 Pain in left toe(s): Secondary | ICD-10-CM | POA: Diagnosis not present

## 2022-02-23 DIAGNOSIS — I340 Nonrheumatic mitral (valve) insufficiency: Secondary | ICD-10-CM | POA: Diagnosis not present

## 2022-02-23 NOTE — Telephone Encounter (Signed)
I called and left a vm to nurse to go ahead and order echo

## 2022-02-23 NOTE — Progress Notes (Signed)
Called pt to inform her about her labs pt mention that her bp has been 104/66 abd that he has lost some weight.

## 2022-02-26 ENCOUNTER — Telehealth: Payer: Self-pay | Admitting: Cardiology

## 2022-02-26 NOTE — Telephone Encounter (Signed)
Patient says she was told to stop taking fluid pills once her weight went back to normal - it has, so she has topped taking them as of Friday 02/23/22. She would like to know if she should get her lab work done now or if she needs to wait about a week before her appointment. ?

## 2022-02-27 DIAGNOSIS — I10 Essential (primary) hypertension: Secondary | ICD-10-CM | POA: Diagnosis not present

## 2022-02-27 DIAGNOSIS — E1165 Type 2 diabetes mellitus with hyperglycemia: Secondary | ICD-10-CM | POA: Diagnosis not present

## 2022-02-27 DIAGNOSIS — E7800 Pure hypercholesterolemia, unspecified: Secondary | ICD-10-CM | POA: Diagnosis not present

## 2022-02-27 NOTE — Progress Notes (Signed)
Patient is a 71 year old female with PMH of breast cancer s/p reconstruction with implant exchange performed 12/19/2021 and subsequent removal of bilateral implants performed 02/05/2022 by [name] due to right side exposure who presents to clinic for postoperative follow-up. ? ?Patient was last seen here in clinic on 02/15/2022.  At that time, she was reporting only scant drainage from JP drains bilaterally.  They removed without complication or difficulty at bedside.  Physical exam was reassuring.  Running 3-0 PDS suture remained intact over each mastectomy incision.  Given her previous poor wound healing in context of radiation treatments, decision was made to leave them in until subsequent appointment. ? ?Today, patient is accompanied by her family at bedside.  She states that she has had a little bit of drainage from her implant removal sites bilaterally.  They are replacing gauze as needed.  She denies any fevers, weakness, redness, or other symptoms.  She states that overall her pain symptoms have improved, but she still has some discomfort for which she takes Tylenol. ? ?On exam, the running PDS sutures are somewhat buried in the incision sites with raised edges.  Mild amount of purulent drainage was able to be expressed from a small wound medially on right breast.  On the left side, there is a 2.25 x .25 centimeter incisional wound.  No significant surrounding erythema or induration. ? ?Running sutures are removed from each side.  No malodor.  No particularly concerning cellulitic changes.  However, there was a small amount of purulent drainage expressed from a small wound over medial aspect right breast.  We will treat with doxycycline x7 days.  Continue with compressive garments and activity modifications. ? ?Return in 7 to 10 days. Picture(s) obtained of the patient and placed in the chart were with the patient's or guardian's permission. ? ?

## 2022-02-28 NOTE — Telephone Encounter (Signed)
Before the next visit.  ? ?ST

## 2022-03-02 ENCOUNTER — Encounter: Payer: Self-pay | Admitting: Hematology and Oncology

## 2022-03-02 ENCOUNTER — Telehealth: Payer: Self-pay

## 2022-03-02 ENCOUNTER — Ambulatory Visit (INDEPENDENT_AMBULATORY_CARE_PROVIDER_SITE_OTHER): Payer: Medicare Other | Admitting: Physician Assistant

## 2022-03-02 DIAGNOSIS — Z9889 Other specified postprocedural states: Secondary | ICD-10-CM

## 2022-03-02 MED ORDER — DOXYCYCLINE HYCLATE 100 MG PO TABS: 100.0000 mg | ORAL_TABLET | Freq: Two times a day (BID) | ORAL | 0 refills | Status: AC

## 2022-03-02 NOTE — Telephone Encounter (Signed)
Pt is calling regarding some symptoms she is experiencing. Questioning if related to meds taking. Would like to discuss ?

## 2022-03-02 NOTE — Telephone Encounter (Signed)
I called patient to see what she was experiencing. Patient stated that she is having problems with her sinuses and headaches, and blood clots. Patient would like for you to give her a call when you can.  ?

## 2022-03-05 NOTE — Telephone Encounter (Signed)
Please call the patient and will discuss. ? ?Kaylum Shrum Terri Skains, DO, [facility]

## 2022-03-06 DIAGNOSIS — I5042 Chronic combined systolic (congestive) and diastolic (congestive) heart failure: Secondary | ICD-10-CM | POA: Diagnosis not present

## 2022-03-06 DIAGNOSIS — I10 Essential (primary) hypertension: Secondary | ICD-10-CM | POA: Diagnosis not present

## 2022-03-07 ENCOUNTER — Inpatient Hospital Stay: Payer: Medicare Other

## 2022-03-07 ENCOUNTER — Inpatient Hospital Stay: Payer: Medicare Other | Admitting: Hematology and Oncology

## 2022-03-07 ENCOUNTER — Encounter: Payer: Self-pay | Admitting: Hematology and Oncology

## 2022-03-07 LAB — BMP8+EGFR
BUN/Creatinine Ratio: 15 (ref 12–28)
BUN: 11 mg/dL (ref 8–27)
CO2: 24 mmol/L (ref 20–29)
Calcium: 10.1 mg/dL (ref 8.7–10.3)
Chloride: 101 mmol/L (ref 96–106)
Creatinine, Ser: 0.73 mg/dL (ref 0.57–1.00)
Glucose: 101 mg/dL — ABNORMAL HIGH (ref 70–99)
Potassium: 4.7 mmol/L (ref 3.5–5.2)
Sodium: 139 mmol/L (ref 134–144)
eGFR: 88 mL/min/{1.73_m2} (ref >59)

## 2022-03-07 LAB — PRO B NATRIURETIC PEPTIDE: NT-Pro BNP: 338 pg/mL — ABNORMAL HIGH (ref 0–301)

## 2022-03-07 NOTE — Progress Notes (Signed)
Called pt to inform her about her lab results. Pt understood

## 2022-03-09 ENCOUNTER — Ambulatory Visit (HOSPITAL_COMMUNITY)
Admission: RE | Admit: 2022-03-09 | Discharge: 2022-03-09 | Disposition: A | Discharge status: Home / Self Care | Payer: Medicare Other | Source: Ambulatory Visit | Attending: Hematology and Oncology | Admitting: Hematology and Oncology

## 2022-03-09 DIAGNOSIS — I340 Nonrheumatic mitral (valve) insufficiency: Secondary | ICD-10-CM | POA: Diagnosis not present

## 2022-03-09 DIAGNOSIS — C50919 Malignant neoplasm of unspecified site of unspecified female breast: Secondary | ICD-10-CM | POA: Diagnosis not present

## 2022-03-09 DIAGNOSIS — Z0189 Encounter for other specified special examinations: Secondary | ICD-10-CM

## 2022-03-09 DIAGNOSIS — E119 Type 2 diabetes mellitus without complications: Secondary | ICD-10-CM | POA: Insufficient documentation

## 2022-03-09 DIAGNOSIS — Z01818 Encounter for other preprocedural examination: Secondary | ICD-10-CM | POA: Insufficient documentation

## 2022-03-09 DIAGNOSIS — I509 Heart failure, unspecified: Secondary | ICD-10-CM | POA: Insufficient documentation

## 2022-03-09 DIAGNOSIS — Z79899 Other long term (current) drug therapy: Secondary | ICD-10-CM | POA: Diagnosis not present

## 2022-03-09 DIAGNOSIS — Z5181 Encounter for therapeutic drug level monitoring: Secondary | ICD-10-CM | POA: Diagnosis not present

## 2022-03-09 DIAGNOSIS — R079 Chest pain, unspecified: Secondary | ICD-10-CM | POA: Diagnosis not present

## 2022-03-09 LAB — ECHOCARDIOGRAM COMPLETE
Area-P 1/2: 3.65 cm2
Calc EF: 44.9 %
S' Lateral: 4.2 cm
Single Plane A2C EF: 45.4 %
Single Plane A4C EF: 43.5 %

## 2022-03-09 NOTE — Progress Notes (Signed)
  Echocardiogram 2D Echocardiogram has been performed.  Bobbye Charleston 03/09/2022, 10:10 AM

## 2022-03-11 ENCOUNTER — Other Ambulatory Visit: Payer: Self-pay | Admitting: Cardiology

## 2022-03-11 DIAGNOSIS — E782 Mixed hyperlipidemia: Secondary | ICD-10-CM

## 2022-03-11 DIAGNOSIS — I428 Other cardiomyopathies: Secondary | ICD-10-CM

## 2022-03-12 ENCOUNTER — Other Ambulatory Visit: Payer: Self-pay | Admitting: Cardiology

## 2022-03-13 ENCOUNTER — Encounter: Payer: Self-pay | Admitting: Cardiology

## 2022-03-13 ENCOUNTER — Ambulatory Visit: Payer: Medicaid Other | Admitting: Cardiology

## 2022-03-13 VITALS — BP 131/62 | HR 67 | Temp 96.6°F | Resp 16 | Ht 67.0 in | Wt 189.0 lb

## 2022-03-13 DIAGNOSIS — C50412 Malignant neoplasm of upper-outer quadrant of left female breast: Secondary | ICD-10-CM | POA: Diagnosis not present

## 2022-03-13 DIAGNOSIS — E119 Type 2 diabetes mellitus without complications: Secondary | ICD-10-CM | POA: Diagnosis not present

## 2022-03-13 DIAGNOSIS — Z171 Estrogen receptor negative status [ER-]: Secondary | ICD-10-CM

## 2022-03-13 DIAGNOSIS — I428 Other cardiomyopathies: Secondary | ICD-10-CM | POA: Diagnosis not present

## 2022-03-13 DIAGNOSIS — I10 Essential (primary) hypertension: Secondary | ICD-10-CM

## 2022-03-13 DIAGNOSIS — E782 Mixed hyperlipidemia: Secondary | ICD-10-CM | POA: Diagnosis not present

## 2022-03-13 DIAGNOSIS — I5042 Chronic combined systolic (congestive) and diastolic (congestive) heart failure: Secondary | ICD-10-CM

## 2022-03-13 DIAGNOSIS — I447 Left bundle-branch block, unspecified: Secondary | ICD-10-CM | POA: Diagnosis not present

## 2022-03-13 DIAGNOSIS — I429 Cardiomyopathy, unspecified: Secondary | ICD-10-CM | POA: Diagnosis not present

## 2022-03-13 NOTE — Telephone Encounter (Signed)
Patient is coming in for an appointment today 5/23

## 2022-03-13 NOTE — Progress Notes (Signed)
Jordan Willis Date of Birth: 24-Aug-1951 MRN: [id number] Primary Care Provider:Pharr, Thayer Jew, MD  Former Cardiology Providers: Miquel Dunn, MSN, APRN, FNP-C Primary Cardiologist: Rex Kras, DO (established care 01/20/2020)  Date: 03/13/22 Last Office Visit: 01/30/2022  Chief Complaint  Patient presents with   Cardiomyopathy   Results   Follow-up   HPI  Jordan Willis is a 71 y.o.  female whose past medical history and cardiovascular risk factors include: Left bundle branch block, type 2 diabetes mellitus, hyperlipidemia, nonischemic cardiomyopathy, chronic combined systolic/diastolic heart failure, recurrent breast cancer status post bilateral mastectomies (currently on chemotherapy), postmenopausal female, advanced age.  Patient is accompanied by her daughter and son at today's office visit who provide collateral history and patient provides verbal consent with having them present during today's encounter.  In June 2022 her LVEF was noted to be 50-55% and after recurrence of breast cancer and initiating chemotherapy her LVEF has been trending down and the most recent LVEF was reported to be 30-35% with global and regional wall motion abnormalities.  Clinically since last office visit patient was having episodes of soft blood pressures and volume overload.  Recommended reducing the dose of Entresto to 49/51 mg p.o. twice daily and starting Bumex on as needed basis.  Patient denies any chest pain or anginal discomfort.  Her dyspnea on exertion is chronic and stable.  She does complain of orthopnea but no PND and lower extremity swelling.  Blood pressures at home are consistently less than 125 mmHg per her memory.  Patient states that she is almost back to baseline.  She has not had chemo for the last 2 months according to the patient.  FUNCTIONAL STATUS: No structured exercise program or daily routine.  ALLERGIES: Allergies  Allergen Reactions   Bactrim  [Sulfamethoxazole-Trimethoprim] Shortness Of Breath    Shortness of breath, difficulty breathing, headache, rash, fatigue   Aspirin Palpitations and Other (See Comments)    Heart fluttering, pt takes 40.5 mg (half of 81 mg) daily   Tape Rash    Tears skin off   MEDICATION LIST PRIOR TO VISIT: Current Outpatient Medications on File Prior to Visit  Medication Sig Dispense Refill   acetaminophen (TYLENOL) 500 MG tablet Take 1,000 mg by mouth every 6 (six) hours as needed for moderate pain.     Ascorbic Acid (VITAMIN C PO) Take 1 capsule by mouth daily as needed.     aspirin 81 MG EC tablet Take 40.5 mg by mouth daily as needed. Swallow whole.     atorvastatin (LIPITOR) 20 MG tablet TAKE 1 TABLET BY MOUTH EVERYDAY AT BEDTIME 90 tablet 1   cetirizine (ZYRTEC) 10 MG tablet Take 10 mg by mouth in the morning.     Cholecalciferol (VITAMIN D3) 25 MCG (1000 UT) CAPS Take 1,000 Units by mouth in the morning.     ENTRESTO 97-103 MG TAKE 1 TABLET BY MOUTH TWICE A DAY 180 tablet 0   FARXIGA 10 MG TABS tablet TAKE 1 TABLET BY MOUTH DAILY BEFORE BREAKFAST. (Patient taking differently: Take 10 mg by mouth daily.) 90 tablet 0   fluticasone (FLONASE) 50 MCG/ACT nasal spray Place 2 sprays into both nostrils in the morning.     gabapentin (NEURONTIN) 100 MG capsule Take 1 capsule (100 mg total) by mouth 2 (two) times daily. (Patient taking differently: Take 100 mg by mouth 2 (two) times daily as needed (muscle spasms).) 30 capsule 1   GLUCOSAMINE-CHONDROITIN PO Take 1 tablet by mouth every evening.  glucose blood (ONETOUCH VERIO) test strip TEST ONCE DAILY AS DIRECTED 90     ibuprofen (ADVIL) 200 MG tablet Take 400 mg by mouth every 6 (six) hours as needed (for pain.).     metFORMIN (GLUCOPHAGE-XR) 500 MG 24 hr tablet Take 500 mg by mouth 2 (two) times daily.     methocarbamol (ROBAXIN) 500 MG tablet Take 1 tablet (500 mg total) by mouth every 8 (eight) hours as needed for muscle spasms. 20 tablet 0    metoprolol succinate (TOPROL-XL) 25 MG 24 hr tablet TAKE 1 TABLET (25 MG TOTAL) BY MOUTH IN THE MORNING. HOLD IF SYSTOLIC BLOOD PRESSURE (TOP BLOOD PRESSURE NUMBER) LESS THAN 100 MMHG OR HEART RATE LESS THAN 60 BPM (PULSE). (Patient taking differently: Take 25 mg by mouth daily. Hold if systolic blood pressure (top blood pressure number) is less than 100 MMHG or heart rate less than 60 BPM (pulse)) 90 tablet 3   Multiple Vitamin (MULTIVITAMIN WITH MINERALS) TABS tablet Take 1 tablet by mouth daily. Centrum Silver for Women 50+     multivitamin-lutein (OCUVITE-LUTEIN) CAPS capsule Take 1 capsule by mouth every evening.     nitroGLYCERIN (NITROSTAT) 0.4 MG SL tablet Place 0.4 mg under the tongue every 5 (five) minutes x 3 doses as needed for chest pain.     Olopatadine HCl 0.2 % SOLN Place 1 drop into both eyes daily.     ondansetron (ZOFRAN) 4 MG tablet Take 1 tablet (4 mg total) by mouth every 8 (eight) hours as needed for nausea or vomiting. 20 tablet 0   OneTouch Delica Lancets 58N MISC Apply topically.     oxyCODONE (ROXICODONE) 5 MG immediate release tablet Take 1 tablet (5 mg total) by mouth every 8 (eight) hours as needed. 20 tablet 0   Polyethyl Glycol-Propyl Glycol (ULTRA LUBRICANT EYE DROPS OP) Place 1 drop into both eyes 3 (three) times daily as needed (for dry eyes).      Probiotic Product (PROBIOTIC DAILY PO) Take 1 capsule by mouth in the morning.     sertraline (ZOLOFT) 50 MG tablet Take 50 mg by mouth in the morning.     solifenacin (VESICARE) 10 MG tablet Take 10 mg by mouth in the morning.     SUPER B COMPLEX/C PO Take 1 tablet by mouth in the morning.     traZODone (DESYREL) 50 MG tablet Take 50 mg by mouth at bedtime as needed for sleep.     bumetanide (BUMEX) 1 MG tablet TAKE 1 TABLET BY MOUTH EVERY DAY (Patient not taking: Reported on 03/13/2022) 30 tablet 0   No current facility-administered medications on file prior to visit.    PAST MEDICAL HISTORY: Past Medical History:   Diagnosis Date   Anemia    Anginal pain (HCC)    Anxiety    Arthritis    Lumbar spine DDD.   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   Diabetes mellitus without complication (HCC)    Type II   Dyspnea    When patient gets sick uses Pro-Air inhaler   Frequent sinus infections    GERD (gastroesophageal reflux disease)    occ   Goiter    History of cardiomegaly    History of cataract    Bilateral   History of kidney stones  08/28/2018 currently has a large kidney stone, asymptomatic at this time   History of migraine    History of uterine fibroid    History of uterine prolapse    Hyperlipidemia    Hypertension    LBBB (left bundle branch block)    Menopause 03/03/2012   Nasal turbinate hypertrophy 11/29/2016   Left inferior    Personal history of chemotherapy    Personal history of radiation therapy    Pneumonia    as a child   PONV (postoperative nausea and vomiting)    Sinusitis 2014   Sleep apnea    No CPAP   SUI (stress urinary incontinence, female)    SVD (spontaneous vaginal delivery)    x 2   Thyroid nodule     PAST SURGICAL HISTORY: Past Surgical History:  Procedure Laterality Date   ABDOMINAL HYSTERECTOMY     BILATERAL SALPINGOOPHORECTOMY Bilateral    BLADDER SUSPENSION N/A 05/16/2017   Procedure: TRANSVAGINAL TAPE (TVT) PROCEDURE;  Surgeon: Everett Graff, MD;  Location: [facility];  Service: Gynecology;  Laterality: N/A;   BREAST BIOPSY Right 2009   BREAST IMPLANT REMOVAL Bilateral 02/05/2022   Procedure: Removal bilateral breast implants;  Surgeon: [name], MD;  Location: [facility];  Service: Plastics;  Laterality: Bilateral;   BREAST LUMPECTOMY Right 2009   BREAST LUMPECTOMY Left 2006   BREAST RECONSTRUCTION WITH PLACEMENT OF TISSUE EXPANDER AND FLEX HD (ACELLULAR  HYDRATED DERMIS) Bilateral 09/04/2021   Procedure: BILATERAL BREAST RECONSTRUCTION WITH PLACEMENT OF TISSUE EXPANDER AND FLEX HD (ACELLULAR HYDRATED DERMIS);  Surgeon: [name], MD;  Location: [facility];  Service: Plastics;  Laterality: Bilateral;   CARDIAC CATHETERIZATION     CATARACT EXTRACTION Left 10/23/2017   CATARACT EXTRACTION Right 11/06/2017   COLONOSCOPY  10/22/2009   Normal.  Repeat 5 years.  [name]   CYSTOSCOPY N/A 05/16/2017   Procedure: CYSTOSCOPY;  Surgeon: Everett Graff, MD;  Location: [facility];  Service: Gynecology;  Laterality: N/A;   DEBRIDEMENT AND CLOSURE WOUND Bilateral 10/03/2021   Procedure: Debridement bilateral mastectomy flaps;  Surgeon: [name], MD;  Location: [facility];  Service: Plastics;  Laterality: Bilateral;  1.5 hour   EYE SURGERY     bilateral cataracts   LEFT HEART CATH AND CORONARY ANGIOGRAPHY N/A 02/09/2020   Procedure: LEFT HEART CATH AND CORONARY ANGIOGRAPHY;  Surgeon: Nigel Mormon, MD;  Location: [facility];  Service: Cardiovascular;  Laterality: N/A;   MASTECTOMY W/ SENTINEL NODE BIOPSY Right 09/04/2021   Procedure: BILATERAL MASTECTOMIES WITH RIGHT SENTINEL LYMPH NODE BIOPSY;  Surgeon: Stark Klein, MD;  Location: [facility];  Service: General;  Laterality: Right;   REMOVAL OF BILATERAL TISSUE EXPANDERS WITH PLACEMENT OF BILATERAL BREAST IMPLANTS Bilateral 10/03/2021   Procedure: REMOVAL OF BILATERAL TISSUE EXPANDERS WITH REPLACEMENT OF TISSUE EXPANDERS;  Surgeon: [name], MD;  Location: [facility];  Service: Plastics;  Laterality: Bilateral;   REMOVAL OF BILATERAL TISSUE EXPANDERS WITH PLACEMENT OF BILATERAL BREAST IMPLANTS Bilateral 12/19/2021   Procedure: REMOVAL OF BILATERAL TISSUE EXPANDERS WITH PLACEMENT OF BILATERAL BREAST IMPLANTS;  Surgeon: [name], MD;  Location: [facility];  Service: Plastics;  Laterality: Bilateral;   ROBOTIC ASSISTED TOTAL HYSTERECTOMY N/A 05/16/2017   Procedure: ROBOTIC ASSISTED TOTAL  HYSTERECTOMY;  Surgeon: Delsa Bern, MD;  Location: [facility];  Service: Gynecology;  Laterality: N/A;   ROTATOR CUFF REPAIR Bilateral    SHOULDER ARTHROSCOPY WITH ROTATOR CUFF REPAIR AND SUBACROMIAL DECOMPRESSION Right 09/03/2018   Procedure: Right  shoulder manipulation under anesthesia, exam under anesthesia, mini open rotator cuff repair, subacromial decompression;  Surgeon: Susa Day, MD;  Location: [facility];  Service: Orthopedics;  Laterality: Right;  90 mins   TUBAL LIGATION     WISDOM TOOTH EXTRACTION      FAMILY HISTORY: The patient's family history includes Breast cancer in her sister and sister; Cancer in her sister and sister; Cancer (age of onset: 90) in her mother; Diabetes in her father; Hypertension in her mother.   SOCIAL HISTORY:  The patient  reports that she has never smoked. She has never used smokeless tobacco. She reports that she does not drink alcohol and does not use drugs.  Review of Systems  Cardiovascular:  Negative for chest pain, dyspnea on exertion, leg swelling, orthopnea, palpitations, paroxysmal nocturnal dyspnea and syncope.  Respiratory:  Negative for cough and shortness of breath.    PHYSICAL EXAM:    03/13/2022    3:39 PM 02/05/2022    4:55 PM 02/05/2022    4:43 PM  Vitals with BMI  Height 5' 7"     Weight 189 lbs    BMI 44.81    Systolic 856 314   Diastolic 62 69   Pulse 67 79 82    CONSTITUTIONAL: Well-developed and well-nourished. No acute distress.  SKIN: Skin is warm and dry. No rash noted. No cyanosis. No pallor. No jaundice HEAD: Normocephalic and atraumatic.  EYES: No scleral icterus MOUTH/THROAT: Moist oral membranes.  NECK: No JVD present. No thyromegaly noted. No carotid bruits  LYMPHATIC: No visible cervical adenopathy.  CHEST Normal respiratory effort. No intercostal retractions.  Bilateral mastectomies. LUNGS: Clear to auscultation bilaterally.  No stridor. No wheezes. No rales.  CARDIOVASCULAR: Regular rate and rhythm,  positive S1-S2, no murmurs rubs or gallops appreciated. ABDOMINAL: Soft, nontender, nondistended, positive bowel sounds in all 4 quadrants, no apparent ascites.  EXTREMITIES: No peripheral edema, warm to touch HEMATOLOGIC: No significant bruising NEUROLOGIC: Oriented to person, place, and time. Nonfocal. Normal muscle tone.  PSYCHIATRIC: Normal mood and affect. Normal behavior. Cooperative No significant change in physical examination since last office encounter.  CARDIAC DATABASE: EKG: 03/13/2022: Normal sinus rhythm, 64 bpm, LAE, LBBB.  Echocardiogram: 03/29/2021:  50-55%, G1DD, moderate MR, Moderate TR, PASP 53mHG.   06/15/2021: LVEF 30-35%, severe hypokinesis of the anteroseptal and apex, moderate to severe LV dysfunction, grade 1 diastolic dysfunction, elevated LAP, global longitudinal strain -16.7%, mild to moderate Mr.   10/09/2021: 45-50%, no regional wall motion abnormalities, global longitudinal strain -13.2%, mild MR.   01/29/2022: 35-40%, average GLS -197.0% grade 1 diastolic impairment, severe hypokinesis of the left ventricle with regional wall motion abnormalities, moderately dilated left atrium, posterior pericardial effusion without tamponade, moderate to severe MR, estimated RAP 8 mmHg.  03/09/2022: LVEF 30-35%, average GLS -15.4%, mild MR, RAP 3 mmHg.  Stress Testing:  02[facility]14/2018 [facility] Achieved 7 METS, normal blood pressure response to exercise, had exertional chest pain that improved/resolved during recovery, normal myocardial perfusion study no EKG abnormalities per report.  Heart Catheterization: 02/09/2020 by Dr. MJoya GaskinsPatwardhan: Right dominant system, no angiographically significant coronary artery disease.  Normal LVEDP.  LABORATORY DATA:    Latest Ref Rng & Units 02/05/2022    2:14 PM 02/05/2022    1:01 PM 11/24/2021    1:55 PM  CBC  WBC 4.0 - 10.5 K/uL  5.2   6.2    Hemoglobin 12.0 - 15.0 g/dL 12.6   10.8   10.7  Hematocrit 36.0 - 46.0 % 37.0   35.1   32.8    Platelets 150 - 400 K/uL  315   353         Latest Ref Rng & Units 03/06/2022    8:07 AM 02/16/2022    9:42 AM 02/05/2022    2:14 PM  CMP  Glucose 70 - 99 mg/dL 101   108   80    BUN 8 - 27 mg/dL 11   11   10     Creatinine 0.57 - 1.00 mg/dL 0.73   0.65   0.60    Sodium 134 - 144 mmol/L 139   139   139    Potassium 3.5 - 5.2 mmol/L 4.7   4.6   3.6    Chloride 96 - 106 mmol/L 101   103   104    CO2 20 - 29 mmol/L 24   24     Calcium 8.7 - 10.3 mg/dL 10.1   9.9       Lipid Panel: Outside records 06/24/2018: Total cholesterol 176, triglycerides 104, HDL 52, LDL 103  Lipid Panel  Lab Results  Component Value Date   CHOL 158 10/13/2020   HDL 44 10/13/2020   LDLCALC 94 10/13/2020   TRIG 107 10/13/2020   CHOLHDL 6.2 10/11/2013    Lab Results  Component Value Date   HGBA1C 7.2 (H) 08/01/2021   HGBA1C 6.1 (H) 08/28/2018   HGBA1C 6.8 (H) 10/11/2013   No components found for: NTPROBNP Lab Results  Component Value Date   TSH 0.599 10/11/2013   TSH 0.401 07/02/2012    Cardiac Panel (last 3 results) No results for input(s): CKTOTAL, CKMB, TROPONINIHS, RELINDX in the last 72 hours.  External Labs: Collected: 09/26/2021 available in Care Everywhere. Sodium 147, potassium 4.6, chloride 107, bicarb 26, BUN 9, creatinine 0.62 AST 25, ALT 39, alkaline phosphatase 88 EGFR 106 Hemoglobin A1c 7.1 TSH 0.52  Collected 02/16/2021: Total cholesterol 182, triglycerides 118, HDL 44, LDL 114, non-HDL 117   FINAL MEDICATION LIST END OF ENCOUNTER: No orders of the defined types were placed in this encounter.   Current Outpatient Medications:    acetaminophen (TYLENOL) 500 MG tablet, Take 1,000 mg by mouth every 6 (six) hours as needed for moderate pain., Disp: , Rfl:    Ascorbic Acid (VITAMIN C PO), Take 1 capsule by mouth daily as needed., Disp: , Rfl:    aspirin 81 MG EC tablet, Take 40.5 mg by mouth daily as needed. Swallow whole.,  Disp: , Rfl:    atorvastatin (LIPITOR) 20 MG tablet, TAKE 1 TABLET BY MOUTH EVERYDAY AT BEDTIME, Disp: 90 tablet, Rfl: 1   cetirizine (ZYRTEC) 10 MG tablet, Take 10 mg by mouth in the morning., Disp: , Rfl:    Cholecalciferol (VITAMIN D3) 25 MCG (1000 UT) CAPS, Take 1,000 Units by mouth in the morning., Disp: , Rfl:    ENTRESTO 97-103 MG, TAKE 1 TABLET BY MOUTH TWICE A DAY, Disp: 180 tablet, Rfl: 0   FARXIGA 10 MG TABS tablet, TAKE 1 TABLET BY MOUTH DAILY BEFORE BREAKFAST. (Patient taking differently: Take 10 mg by mouth daily.), Disp: 90 tablet, Rfl: 0   fluticasone (FLONASE) 50 MCG/ACT nasal spray, Place 2 sprays into both nostrils in the morning., Disp: , Rfl:    gabapentin (NEURONTIN) 100 MG capsule, Take 1 capsule (100 mg total) by mouth 2 (two) times daily. (Patient taking differently: Take 100 mg by mouth 2 (two) times daily as needed (muscle  spasms).), Disp: 30 capsule, Rfl: 1   GLUCOSAMINE-CHONDROITIN PO, Take 1 tablet by mouth every evening. , Disp: , Rfl:    glucose blood (ONETOUCH VERIO) test strip, TEST ONCE DAILY AS DIRECTED 90, Disp: , Rfl:    ibuprofen (ADVIL) 200 MG tablet, Take 400 mg by mouth every 6 (six) hours as needed (for pain.)., Disp: , Rfl:    metFORMIN (GLUCOPHAGE-XR) 500 MG 24 hr tablet, Take 500 mg by mouth 2 (two) times daily., Disp: , Rfl:    methocarbamol (ROBAXIN) 500 MG tablet, Take 1 tablet (500 mg total) by mouth every 8 (eight) hours as needed for muscle spasms., Disp: 20 tablet, Rfl: 0   metoprolol succinate (TOPROL-XL) 25 MG 24 hr tablet, TAKE 1 TABLET (25 MG TOTAL) BY MOUTH IN THE MORNING. HOLD IF SYSTOLIC BLOOD PRESSURE (TOP BLOOD PRESSURE NUMBER) LESS THAN 100 MMHG OR HEART RATE LESS THAN 60 BPM (PULSE). (Patient taking differently: Take 25 mg by mouth daily. Hold if systolic blood pressure (top blood pressure number) is less than 100 MMHG or heart rate less than 60 BPM (pulse)), Disp: 90 tablet, Rfl: 3   Multiple Vitamin (MULTIVITAMIN WITH MINERALS) TABS  tablet, Take 1 tablet by mouth daily. Centrum Silver for Women 50+, Disp: , Rfl:    multivitamin-lutein (OCUVITE-LUTEIN) CAPS capsule, Take 1 capsule by mouth every evening., Disp: , Rfl:    nitroGLYCERIN (NITROSTAT) 0.4 MG SL tablet, Place 0.4 mg under the tongue every 5 (five) minutes x 3 doses as needed for chest pain., Disp: , Rfl:    Olopatadine HCl 0.2 % SOLN, Place 1 drop into both eyes daily., Disp: , Rfl:    ondansetron (ZOFRAN) 4 MG tablet, Take 1 tablet (4 mg total) by mouth every 8 (eight) hours as needed for nausea or vomiting., Disp: 20 tablet, Rfl: 0   OneTouch Delica Lancets 91Y MISC, Apply topically., Disp: , Rfl:    oxyCODONE (ROXICODONE) 5 MG immediate release tablet, Take 1 tablet (5 mg total) by mouth every 8 (eight) hours as needed., Disp: 20 tablet, Rfl: 0   Polyethyl Glycol-Propyl Glycol (ULTRA LUBRICANT EYE DROPS OP), Place 1 drop into both eyes 3 (three) times daily as needed (for dry eyes). , Disp: , Rfl:    Probiotic Product (PROBIOTIC DAILY PO), Take 1 capsule by mouth in the morning., Disp: , Rfl:    sertraline (ZOLOFT) 50 MG tablet, Take 50 mg by mouth in the morning., Disp: , Rfl:    solifenacin (VESICARE) 10 MG tablet, Take 10 mg by mouth in the morning., Disp: , Rfl:    SUPER B COMPLEX/C PO, Take 1 tablet by mouth in the morning., Disp: , Rfl:    traZODone (DESYREL) 50 MG tablet, Take 50 mg by mouth at bedtime as needed for sleep., Disp: , Rfl:    bumetanide (BUMEX) 1 MG tablet, TAKE 1 TABLET BY MOUTH EVERY DAY (Patient not taking: Reported on 03/13/2022), Disp: 30 tablet, Rfl: 0  IMPRESSION:    ICD-10-CM   1. NICM (nonischemic cardiomyopathy) ([place])  I42.8     2. Chronic combined systolic and diastolic heart failure (HCC)  I50.42     3. Cardiomyopathy, unspecified type ([place])  I42.9 EKG 12-Lead    4. Essential hypertension  I10     5. LBBB (left bundle branch block)  I44.7     6. Non-insulin dependent type 2 diabetes mellitus ([place])  E11.9     7. Mixed  hyperlipidemia  E78.2     8. Malignant neoplasm  of upper-outer quadrant of left breast in female, estrogen receptor negative ([place])  C50.412    Z17.1        RECOMMENDATIONS: Jordan Willis is a 71 y.o. female whose past medical history and cardiovascular risk factors include: Left bundle branch block, type 2 diabetes mellitus, hyperlipidemia, nonischemic cardiomyopathy, chronic combined systolic/diastolic heart failure, recurrent breast cancer status post bilateral mastectomies (currently on chemotherapy), postmenopausal female, advanced age.  NICM (nonischemic cardiomyopathy) ([place]) / Chronic combined systolic and diastolic heart failure (HCC) /Cardiomyopathy, unspecified type (HCC) Stage B, NYHA class II. Since last office visit patient has diuresed well - repeat echo notes improved RAP and MR.  Unable to accurately reconcile meds as patient does not have medication bottles or list of what she actually is taking on a daily basis. I have requested her daughter to call the office back and reconcile medications so that changes can be made appropriately. I reviewed her prior echocardiogram from June 2022 and May 2023.  The underlying RWMA likely due to LBBB but since its more pronounced compared to prior studies and new onset cardiomyopathy cannot rule out ischemic burden. Reviewed the images w/ patient and family. Shared decision is to proceed with left heart catheterization with possible intervention.  The procedure of left heart catheterization with possible intervention was explained to the patient, daughter and son in detail.  The indication, alternatives, risks and benefits were reviewed.  Complications include but not limited to bleeding, infection, vascular injury, stroke, myocardial infarction, arrhythmia (requiring medical or cardiopulmonary resuscitation), kidney injury (requiring short-term or long-term hemodialysis), radiation-related injury in the case of prolonged fluoroscopy use,  emergent cardiac surgery, temporary or permanent pacemaker, and death. The patient and family understands the risks of serious complication is 1-2 in 0737 with diagnostic cardiac cath and 1-2% or less with angioplasty/stenting.  The patient and family voices understanding and provides verbal feedback there questions and concerns are addressed to their satisfaction and patient wishes to proceed with coronary angiography with possible PCI.  Essential hypertension Office and home blood pressures are well controlled. Medications reconciled.  Non-insulin dependent type 2 diabetes mellitus ([place]) Most recent hemoglobin A1c reviewed. Educated importance of glycemic control. Continue Entresto, statin therapy, Farxiga.  Mixed hyperlipidemia Currently on atorvastatin. Denies myalgias or other side effects. Most recent lipid profile from May 2023 independently reviewed, available in [place]. Once medications are reconciled -we will discuss medication titration.  Malignant neoplasm of upper-outer quadrant of left breast in female, estrogen receptor negative ([place]) Currently being followed by oncology.  --Continue cardiac medications as reconciled in final medication list. --Return in about 4 weeks (around 04/10/2022) for Follow up, Post heart catheterization. Or sooner if needed. --Continue follow-up with your primary care physician regarding the management of your other chronic comorbid conditions.  Patient's questions and concerns were addressed to her satisfaction. She voices understanding of the instructions provided during this encounter.   This note was created using a voice recognition software as a result there may be grammatical errors inadvertently enclosed that do not reflect the nature of this encounter. Every attempt is made to correct such errors.  Total time spent: 45 minutes discussing recent heart failure exacerbation, the importance of medication compliance and knowing what  medications she is taking on a daily basis, independently reviewed echoes from June 2022 and May 2023 with the patient and family, discussed the risks, benefits, and alternatives to heart catheterization, independently reviewed lipids and labs that are available in Care Everywhere.  Glynna Failla, DO,  [facility]  Pager: [phone] Office: [phone]

## 2022-03-13 NOTE — H&P (View-Only) (Signed)
Jordan Willis Date of Birth: [birth year] MRN: [id number] Primary Care Provider:Pharr, Thayer Jew, MD  Former Cardiology Providers: Miquel Dunn, MSN, APRN, FNP-C Primary Cardiologist: Rex Kras, DO (established care 01/20/2020)  Date: 03/13/22 Last Office Visit: 01/30/2022  Chief Complaint  Patient presents with   Cardiomyopathy   Results   Follow-up   HPI  Jordan Willis is a 71 y.o.  female whose past medical history and cardiovascular risk factors include: Left bundle branch block, type 2 diabetes mellitus, hyperlipidemia, nonischemic cardiomyopathy, chronic combined systolic/diastolic heart failure, recurrent breast cancer status post bilateral mastectomies (currently on chemotherapy), postmenopausal female, advanced age.  Patient is accompanied by her daughter and son at today's office visit who provide collateral history and patient provides verbal consent with having them present during today's encounter.  In June 2022 her LVEF was noted to be 50-55% and after recurrence of breast cancer and initiating chemotherapy her LVEF has been trending down and the most recent LVEF was reported to be 30-35% with global and regional wall motion abnormalities.  Clinically since last office visit patient was having episodes of soft blood pressures and volume overload.  Recommended reducing the dose of Entresto to 49/51 mg p.o. twice daily and starting Bumex on as needed basis.  Patient denies any chest pain or anginal discomfort.  Her dyspnea on exertion is chronic and stable.  She does complain of orthopnea but no PND and lower extremity swelling.  Blood pressures at home are consistently less than 125 mmHg per her memory.  Patient states that she is almost back to baseline.  She has not had chemo for the last 2 months according to the patient.  FUNCTIONAL STATUS: No structured exercise program or daily routine.  ALLERGIES: Allergies  Allergen Reactions   Bactrim  [Sulfamethoxazole-Trimethoprim] Shortness Of Breath    Shortness of breath, difficulty breathing, headache, rash, fatigue   Aspirin Palpitations and Other (See Comments)    Heart fluttering, pt takes 40.5 mg (half of 81 mg) daily   Sulfa Antibiotics Hives   Tape Rash    Tears skin off   MEDICATION LIST PRIOR TO VISIT: Current Outpatient Medications on File Prior to Visit  Medication Sig Dispense Refill   acetaminophen (TYLENOL) 500 MG tablet Take 1,000 mg by mouth every 6 (six) hours as needed for moderate pain.     Ascorbic Acid (VITAMIN C PO) Take 1 capsule by mouth daily as needed.     aspirin 81 MG EC tablet Take 40.5 mg by mouth daily as needed. Swallow whole.     atorvastatin (LIPITOR) 20 MG tablet TAKE 1 TABLET BY MOUTH EVERYDAY AT BEDTIME 90 tablet 1   cetirizine (ZYRTEC) 10 MG tablet Take 10 mg by mouth in the morning.     Cholecalciferol (VITAMIN D3) 25 MCG (1000 UT) CAPS Take 1,000 Units by mouth in the morning.     FARXIGA 10 MG TABS tablet TAKE 1 TABLET BY MOUTH DAILY BEFORE BREAKFAST. (Patient taking differently: Take 10 mg by mouth daily.) 90 tablet 0   fluticasone (FLONASE) 50 MCG/ACT nasal spray Place 2 sprays into both nostrils in the morning.     gabapentin (NEURONTIN) 100 MG capsule Take 1 capsule (100 mg total) by mouth 2 (two) times daily. (Patient taking differently: Take 100 mg by mouth 2 (two) times daily as needed (muscle spasms).) 30 capsule 1   GLUCOSAMINE-CHONDROITIN PO Take 1 tablet by mouth every evening.      glucose blood (ONETOUCH VERIO) test strip TEST  ONCE DAILY AS DIRECTED 90     ibuprofen (ADVIL) 200 MG tablet Take 400 mg by mouth every 6 (six) hours as needed (for pain.).     metFORMIN (GLUCOPHAGE-XR) 500 MG 24 hr tablet Take 500 mg by mouth 2 (two) times daily.     methocarbamol (ROBAXIN) 500 MG tablet Take 1 tablet (500 mg total) by mouth every 8 (eight) hours as needed for muscle spasms. 20 tablet 0   metoprolol succinate (TOPROL-XL) 25 MG 24 hr  tablet TAKE 1 TABLET (25 MG TOTAL) BY MOUTH IN THE MORNING. HOLD IF SYSTOLIC BLOOD PRESSURE (TOP BLOOD PRESSURE NUMBER) LESS THAN 100 MMHG OR HEART RATE LESS THAN 60 BPM (PULSE). (Patient taking differently: Take 25 mg by mouth daily. Hold if systolic blood pressure (top blood pressure number) is less than 100 MMHG or heart rate less than 60 BPM (pulse)) 90 tablet 3   Multiple Vitamin (MULTIVITAMIN WITH MINERALS) TABS tablet Take 1 tablet by mouth daily. Centrum Silver for Women 50+     multivitamin-lutein (OCUVITE-LUTEIN) CAPS capsule Take 1 capsule by mouth every evening.     nitroGLYCERIN (NITROSTAT) 0.4 MG SL tablet Place 0.4 mg under the tongue every 5 (five) minutes x 3 doses as needed for chest pain.     Olopatadine HCl 0.2 % SOLN Place 1 drop into both eyes daily.     ondansetron (ZOFRAN) 4 MG tablet Take 1 tablet (4 mg total) by mouth every 8 (eight) hours as needed for nausea or vomiting. 20 tablet 0   OneTouch Delica Lancets 43X MISC Apply topically.     oxyCODONE (ROXICODONE) 5 MG immediate release tablet Take 1 tablet (5 mg total) by mouth every 8 (eight) hours as needed. 20 tablet 0   Polyethyl Glycol-Propyl Glycol (ULTRA LUBRICANT EYE DROPS OP) Place 1 drop into both eyes 3 (three) times daily as needed (for dry eyes).      Probiotic Product (PROBIOTIC DAILY PO) Take 1 capsule by mouth in the morning.     sertraline (ZOLOFT) 50 MG tablet Take 50 mg by mouth in the morning.     solifenacin (VESICARE) 10 MG tablet Take 10 mg by mouth in the morning.     SUPER B COMPLEX/C PO Take 1 tablet by mouth in the morning.     traZODone (DESYREL) 50 MG tablet Take 50 mg by mouth at bedtime as needed for sleep.     No current facility-administered medications on file prior to visit.    PAST MEDICAL HISTORY: Past Medical History:  Diagnosis Date   Anemia    Anginal pain (HCC)    Anxiety    Arthritis    Lumbar spine DDD.   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   Diabetes mellitus without complication (HCC)    Type II   Dyspnea    When patient gets sick uses Pro-Air inhaler   Frequent sinus infections    GERD (gastroesophageal reflux disease)    occ   Goiter    History of cardiomegaly    History of cataract    Bilateral   History of kidney stones    08/28/2018 currently has a large kidney stone, asymptomatic at this time   History of migraine    History of uterine fibroid    History  of uterine prolapse    Hyperlipidemia    Hypertension    LBBB (left bundle branch block)    Menopause 03/03/2012   Nasal turbinate hypertrophy 11/29/2016   Left inferior    Personal history of chemotherapy    Personal history of radiation therapy    Pneumonia    as a child   PONV (postoperative nausea and vomiting)    Sinusitis 2014   Sleep apnea    No CPAP   SUI (stress urinary incontinence, female)    SVD (spontaneous vaginal delivery)    x 2   Thyroid nodule     PAST SURGICAL HISTORY: Past Surgical History:  Procedure Laterality Date   ABDOMINAL HYSTERECTOMY     BILATERAL SALPINGOOPHORECTOMY Bilateral    BLADDER SUSPENSION N/A 05/16/2017   Procedure: TRANSVAGINAL TAPE (TVT) PROCEDURE;  Surgeon: Everett Graff, MD;  Location: [facility];  Service: Gynecology;  Laterality: N/A;   BREAST BIOPSY Right 2009   BREAST IMPLANT REMOVAL Bilateral 02/05/2022   Procedure: Removal bilateral breast implants;  Surgeon: [name], MD;  Location: [facility];  Service: Plastics;  Laterality: Bilateral;   BREAST LUMPECTOMY Right 2009   BREAST LUMPECTOMY Left 2006   BREAST RECONSTRUCTION WITH PLACEMENT OF TISSUE EXPANDER AND FLEX HD (ACELLULAR HYDRATED DERMIS) Bilateral 09/04/2021   Procedure: BILATERAL BREAST RECONSTRUCTION WITH PLACEMENT OF TISSUE EXPANDER AND FLEX HD (ACELLULAR HYDRATED DERMIS);  Surgeon:  [name], MD;  Location: [facility];  Service: Plastics;  Laterality: Bilateral;   CARDIAC CATHETERIZATION     CATARACT EXTRACTION Left 10/23/2017   CATARACT EXTRACTION Right 11/06/2017   COLONOSCOPY  10/22/2009   Normal.  Repeat 5 years.  [name]   CYSTOSCOPY N/A 05/16/2017   Procedure: CYSTOSCOPY;  Surgeon: Everett Graff, MD;  Location: [facility];  Service: Gynecology;  Laterality: N/A;   DEBRIDEMENT AND CLOSURE WOUND Bilateral 10/03/2021   Procedure: Debridement bilateral mastectomy flaps;  Surgeon: [name], MD;  Location: [facility];  Service: Plastics;  Laterality: Bilateral;  1.5 hour   EYE SURGERY     bilateral cataracts   LEFT HEART CATH AND CORONARY ANGIOGRAPHY N/A 02/09/2020   Procedure: LEFT HEART CATH AND CORONARY ANGIOGRAPHY;  Surgeon: Nigel Mormon, MD;  Location: [facility];  Service: Cardiovascular;  Laterality: N/A;   MASTECTOMY W/ SENTINEL NODE BIOPSY Right 09/04/2021   Procedure: BILATERAL MASTECTOMIES WITH RIGHT SENTINEL LYMPH NODE BIOPSY;  Surgeon: Stark Klein, MD;  Location: [facility];  Service: General;  Laterality: Right;   REMOVAL OF BILATERAL TISSUE EXPANDERS WITH PLACEMENT OF BILATERAL BREAST IMPLANTS Bilateral 10/03/2021   Procedure: REMOVAL OF BILATERAL TISSUE EXPANDERS WITH REPLACEMENT OF TISSUE EXPANDERS;  Surgeon: [name], MD;  Location: [facility];  Service: Plastics;  Laterality: Bilateral;   REMOVAL OF BILATERAL TISSUE EXPANDERS WITH PLACEMENT OF BILATERAL BREAST IMPLANTS Bilateral 12/19/2021   Procedure: REMOVAL OF BILATERAL TISSUE EXPANDERS WITH PLACEMENT OF BILATERAL BREAST IMPLANTS;  Surgeon: [name], MD;  Location: [facility];  Service: Plastics;  Laterality: Bilateral;   ROBOTIC ASSISTED TOTAL HYSTERECTOMY N/A 05/16/2017   Procedure: ROBOTIC ASSISTED TOTAL HYSTERECTOMY;  Surgeon: Delsa Bern, MD;  Location: [facility];  Service: Gynecology;  Laterality: N/A;   ROTATOR CUFF REPAIR Bilateral    SHOULDER ARTHROSCOPY WITH ROTATOR CUFF  REPAIR AND SUBACROMIAL DECOMPRESSION Right 09/03/2018   Procedure: Right shoulder manipulation under anesthesia, exam under anesthesia, mini open rotator cuff repair, subacromial decompression;  Surgeon: Susa Day, MD;  Location: [facility];  Service: Orthopedics;  Laterality: Right;  90 mins   TUBAL LIGATION     WISDOM TOOTH EXTRACTION      FAMILY HISTORY: The patient's family history includes Breast cancer in her sister and sister; Cancer in her sister and sister; Cancer (age of onset: 53) in her mother; Diabetes in her father; Hypertension in her mother.   SOCIAL HISTORY:  The patient  reports that she has never smoked. She has never used smokeless tobacco. She reports that she does not drink alcohol and does not use drugs.  Review of Systems  Cardiovascular:  Negative for chest pain, dyspnea on exertion, leg swelling, orthopnea, palpitations, paroxysmal nocturnal dyspnea and syncope.  Respiratory:  Negative for cough and shortness of breath.    PHYSICAL EXAM:    03/13/2022    3:39 PM 02/05/2022    4:55 PM 02/05/2022    4:43 PM  Vitals with BMI  Height _0     Weight 189 lbs    BMI 52.84    Systolic 132 440   Diastolic 62 69   Pulse 67 79 82    CONSTITUTIONAL: Well-developed and well-nourished. No acute distress.  SKIN: Skin is warm and dry. No rash noted. No cyanosis. No pallor. No jaundice HEAD: Normocephalic and atraumatic.  EYES: No scleral icterus MOUTH/THROAT: Moist oral membranes.  NECK: No JVD present. No thyromegaly noted. No carotid bruits  LYMPHATIC: No visible cervical adenopathy.  CHEST Normal respiratory effort. No intercostal retractions.  Bilateral mastectomies. LUNGS: Clear to auscultation bilaterally.  No stridor. No wheezes. No rales.  CARDIOVASCULAR: Regular rate and rhythm, positive S1-S2, no murmurs rubs or gallops appreciated. ABDOMINAL: Soft, nontender, nondistended, positive bowel sounds in all 4 quadrants, no apparent ascites.  EXTREMITIES: No  peripheral edema, warm to touch HEMATOLOGIC: No significant bruising NEUROLOGIC: Oriented to person, place, and time. Nonfocal. Normal muscle tone.  PSYCHIATRIC: Normal mood and affect. Normal behavior. Cooperative No significant change in physical examination since last office encounter.  CARDIAC DATABASE: EKG: 03/13/2022: Normal sinus rhythm, 64 bpm, LAE, LBBB.  Echocardiogram: 03/29/2021:  50-55%, G1DD, moderate MR, Moderate TR, PASP 54mHG.   06/15/2021: LVEF 30-35%, severe hypokinesis of the anteroseptal and apex, moderate to severe LV dysfunction, grade 1 diastolic dysfunction, elevated LAP, global longitudinal strain -16.7%, mild to moderate Mr.   10/09/2021: 45-50%, no regional wall motion abnormalities, global longitudinal strain -13.2%, mild MR.   01/29/2022: 35-40%, average GLS -110.2% grade 1 diastolic impairment, severe hypokinesis of the left ventricle with regional wall motion abnormalities, moderately dilated left atrium, posterior pericardial effusion without tamponade, moderate to severe MR, estimated RAP 8 mmHg.  03/09/2022: LVEF 30-35%, average GLS -15.4%, mild MR, RAP 3 mmHg.  Stress Testing:  02[facility]14/2018 [facility] Achieved 7 METS, normal blood pressure response to exercise, had exertional chest pain that improved/resolved during recovery, normal myocardial perfusion study no EKG abnormalities per report.  Heart Catheterization: 02/09/2020 by Dr. MJoya GaskinsPatwardhan: Right dominant system, no angiographically significant coronary artery disease.  Normal LVEDP.  LABORATORY DATA:    Latest Ref Rng & Units 02/05/2022    2:14 PM 02/05/2022    1:01 PM 11/24/2021    1:55 PM  CBC  WBC 4.0 - 10.5 K/uL  5.2   6.2    Hemoglobin 12.0 - 15.0 g/dL 12.6   10.8   10.7    Hematocrit 36.0 - 46.0 % 37.0   35.1   32.8    Platelets 150 - 400 K/uL  315   353  Latest Ref Rng & Units 03/06/2022    8:07 AM 02/16/2022    9:42 AM 02/05/2022     2:14 PM  CMP  Glucose 70 - 99 mg/dL 101   108   80    BUN 8 - 27 mg/dL _0 Creatinine 0.57 - 1.00 mg/dL 0.73   0.65   0.60    Sodium 134 - 144 mmol/L 139   139   139    Potassium 3.5 - 5.2 mmol/L 4.7   4.6   3.6    Chloride 96 - 106 mmol/L 101   103   104    CO2 20 - 29 mmol/L 24   24     Calcium 8.7 - 10.3 mg/dL 10.1   9.9       Lipid Panel: Outside records 06/24/2018: Total cholesterol 176, triglycerides 104, HDL 52, LDL 103  Lipid Panel  Lab Results  Component Value Date   CHOL 158 10/13/2020   HDL 44 10/13/2020   LDLCALC 94 10/13/2020   TRIG 107 10/13/2020   CHOLHDL 6.2 10/11/2013    Lab Results  Component Value Date   HGBA1C 7.2 (H) 08/01/2021   HGBA1C 6.1 (H) 08/28/2018   HGBA1C 6.8 (H) 10/11/2013   No components found for: NTPROBNP Lab Results  Component Value Date   TSH 0.599 10/11/2013   TSH 0.401 07/02/2012    Cardiac Panel (last 3 results) No results for input(s): CKTOTAL, CKMB, TROPONINIHS, RELINDX in the last 72 hours.  External Labs: Collected: 09/26/2021 available in Care Everywhere. Sodium 147, potassium 4.6, chloride 107, bicarb 26, BUN 9, creatinine 0.62 AST 25, ALT 39, alkaline phosphatase 88 EGFR 106 Hemoglobin A1c 7.1 TSH 0.52  Collected 02/16/2021: Total cholesterol 182, triglycerides 118, HDL 44, LDL 114, non-HDL 117   FINAL MEDICATION LIST END OF ENCOUNTER: Meds ordered this encounter  Medications   spironolactone (ALDACTONE) 25 MG tablet    Sig: Take 1 tablet (25 mg total) by mouth every morning.    Dispense:  90 tablet    Refill:  0    Current Outpatient Medications:    acetaminophen (TYLENOL) 500 MG tablet, Take 1,000 mg by mouth every 6 (six) hours as needed for moderate pain., Disp: , Rfl:    Ascorbic Acid (VITAMIN C PO), Take 1 capsule by mouth daily as needed., Disp: , Rfl:    aspirin 81 MG EC tablet, Take 40.5 mg by mouth daily as needed. Swallow whole., Disp: , Rfl:    atorvastatin (LIPITOR) 20 MG tablet, TAKE  1 TABLET BY MOUTH EVERYDAY AT BEDTIME, Disp: 90 tablet, Rfl: 1   cetirizine (ZYRTEC) 10 MG tablet, Take 10 mg by mouth in the morning., Disp: , Rfl:    Cholecalciferol (VITAMIN D3) 25 MCG (1000 UT) CAPS, Take 1,000 Units by mouth in the morning., Disp: , Rfl:    FARXIGA 10 MG TABS tablet, TAKE 1 TABLET BY MOUTH DAILY BEFORE BREAKFAST. (Patient taking differently: Take 10 mg by mouth daily.), Disp: 90 tablet, Rfl: 0   fluticasone (FLONASE) 50 MCG/ACT nasal spray, Place 2 sprays into both nostrils in the morning., Disp: , Rfl:    gabapentin (NEURONTIN) 100 MG capsule, Take 1 capsule (100 mg total) by mouth 2 (two) times daily. (Patient taking differently: Take 100 mg by mouth 2 (two) times daily as needed (muscle spasms).), Disp: 30 capsule, Rfl: 1   GLUCOSAMINE-CHONDROITIN PO, Take 1 tablet by mouth every evening. , Disp: ,  Rfl:    glucose blood (ONETOUCH VERIO) test strip, TEST ONCE DAILY AS DIRECTED 90, Disp: , Rfl:    ibuprofen (ADVIL) 200 MG tablet, Take 400 mg by mouth every 6 (six) hours as needed (for pain.)., Disp: , Rfl:    metFORMIN (GLUCOPHAGE-XR) 500 MG 24 hr tablet, Take 500 mg by mouth 2 (two) times daily., Disp: , Rfl:    methocarbamol (ROBAXIN) 500 MG tablet, Take 1 tablet (500 mg total) by mouth every 8 (eight) hours as needed for muscle spasms., Disp: 20 tablet, Rfl: 0   metoprolol succinate (TOPROL-XL) 25 MG 24 hr tablet, TAKE 1 TABLET (25 MG TOTAL) BY MOUTH IN THE MORNING. HOLD IF SYSTOLIC BLOOD PRESSURE (TOP BLOOD PRESSURE NUMBER) LESS THAN 100 MMHG OR HEART RATE LESS THAN 60 BPM (PULSE). (Patient taking differently: Take 25 mg by mouth daily. Hold if systolic blood pressure (top blood pressure number) is less than 100 MMHG or heart rate less than 60 BPM (pulse)), Disp: 90 tablet, Rfl: 3   Multiple Vitamin (MULTIVITAMIN WITH MINERALS) TABS tablet, Take 1 tablet by mouth daily. Centrum Silver for Women 50+, Disp: , Rfl:    multivitamin-lutein (OCUVITE-LUTEIN) CAPS capsule, Take 1  capsule by mouth every evening., Disp: , Rfl:    nitroGLYCERIN (NITROSTAT) 0.4 MG SL tablet, Place 0.4 mg under the tongue every 5 (five) minutes x 3 doses as needed for chest pain., Disp: , Rfl:    Olopatadine HCl 0.2 % SOLN, Place 1 drop into both eyes daily., Disp: , Rfl:    ondansetron (ZOFRAN) 4 MG tablet, Take 1 tablet (4 mg total) by mouth every 8 (eight) hours as needed for nausea or vomiting., Disp: 20 tablet, Rfl: 0   OneTouch Delica Lancets 53M MISC, Apply topically., Disp: , Rfl:    oxyCODONE (ROXICODONE) 5 MG immediate release tablet, Take 1 tablet (5 mg total) by mouth every 8 (eight) hours as needed., Disp: 20 tablet, Rfl: 0   Polyethyl Glycol-Propyl Glycol (ULTRA LUBRICANT EYE DROPS OP), Place 1 drop into both eyes 3 (three) times daily as needed (for dry eyes). , Disp: , Rfl:    Probiotic Product (PROBIOTIC DAILY PO), Take 1 capsule by mouth in the morning., Disp: , Rfl:    sertraline (ZOLOFT) 50 MG tablet, Take 50 mg by mouth in the morning., Disp: , Rfl:    solifenacin (VESICARE) 10 MG tablet, Take 10 mg by mouth in the morning., Disp: , Rfl:    spironolactone (ALDACTONE) 25 MG tablet, Take 1 tablet (25 mg total) by mouth every morning., Disp: 90 tablet, Rfl: 0   SUPER B COMPLEX/C PO, Take 1 tablet by mouth in the morning., Disp: , Rfl:    traZODone (DESYREL) 50 MG tablet, Take 50 mg by mouth at bedtime as needed for sleep., Disp: , Rfl:    sacubitril-valsartan (ENTRESTO) 49-51 MG, Take 1 tablet by mouth 2 (two) times daily., Disp: 60 tablet, Rfl: 0  IMPRESSION:    ICD-10-CM   1. NICM (nonischemic cardiomyopathy) (HCC)  I42.8 spironolactone (ALDACTONE) 25 MG tablet    CBC    CBC    2. Chronic combined systolic and diastolic heart failure (HCC)  I50.42 spironolactone (ALDACTONE) 25 MG tablet    Basic metabolic panel    Magnesium    Pro b natriuretic peptide (BNP)    Pro b natriuretic peptide (BNP)    Magnesium    Basic metabolic panel    3. Cardiomyopathy, unspecified  type ([place])  I42.9 EKG 12-Lead  spironolactone (ALDACTONE) 25 MG tablet    Basic metabolic panel    Magnesium    Pro b natriuretic peptide (BNP)    Pro b natriuretic peptide (BNP)    Magnesium    Basic metabolic panel    4. Essential hypertension  I10 spironolactone (ALDACTONE) 25 MG tablet    Basic metabolic panel    Magnesium    Pro b natriuretic peptide (BNP)    Pro b natriuretic peptide (BNP)    Magnesium    Basic metabolic panel    5. LBBB (left bundle branch block)  I44.7     6. Non-insulin dependent type 2 diabetes mellitus ([place])  E11.9     7. Mixed hyperlipidemia  E78.2     8. Malignant neoplasm of upper-outer quadrant of left breast in female, estrogen receptor negative ([place])  C50.412    Z17.1        RECOMMENDATIONS: KEITHA KOLK is a 71 y.o. female whose past medical history and cardiovascular risk factors include: Left bundle branch block, type 2 diabetes mellitus, hyperlipidemia, nonischemic cardiomyopathy, chronic combined systolic/diastolic heart failure, recurrent breast cancer status post bilateral mastectomies (currently on chemotherapy), postmenopausal female, advanced age.  NICM (nonischemic cardiomyopathy) ([place]) / Chronic combined systolic and diastolic heart failure (HCC) /Cardiomyopathy, unspecified type (HCC) Stage B, NYHA class II. Since last office visit patient has diuresed well - repeat echo notes improved RAP and MR.  Unable to accurately reconcile meds as patient does not have medication bottles or list of what she actually is taking on a daily basis. I have requested her daughter to call the office back and reconcile medications so that changes can be made appropriately. I reviewed her prior echocardiogram from June 2022 and May 2023.  The underlying RWMA likely due to LBBB but since its more pronounced compared to prior studies and new onset cardiomyopathy cannot rule out ischemic burden. Reviewed the images w/ patient and family. Shared decision is  to proceed with left heart catheterization with possible intervention.  The procedure of left heart catheterization with possible intervention was explained to the patient, daughter and son in detail.  The indication, alternatives, risks and benefits were reviewed.  Complications include but not limited to bleeding, infection, vascular injury, stroke, myocardial infarction, arrhythmia (requiring medical or cardiopulmonary resuscitation), kidney injury (requiring short-term or long-term hemodialysis), radiation-related injury in the case of prolonged fluoroscopy use, emergent cardiac surgery, temporary or permanent pacemaker, and death. The patient and family understands the risks of serious complication is 1-2 in 1700 with diagnostic cardiac cath and 1-2% or less with angioplasty/stenting.  The patient and family voices understanding and provides verbal feedback there questions and concerns are addressed to their satisfaction and patient wishes to proceed with coronary angiography with possible PCI.  Essential hypertension Office and home blood pressures are well controlled. Medications reconciled.  Non-insulin dependent type 2 diabetes mellitus ([place]) Most recent hemoglobin A1c reviewed. Educated importance of glycemic control. Continue Entresto, statin therapy, Farxiga.  Mixed hyperlipidemia Currently on atorvastatin. Denies myalgias or other side effects. Most recent lipid profile from May 2023 independently reviewed, available in [place]. Once medications are reconciled -we will discuss medication titration.  Malignant neoplasm of upper-outer quadrant of left breast in female, estrogen receptor negative ([place]) Currently being followed by oncology.  --Continue cardiac medications as reconciled in final medication list. --Return in about 4 weeks (around 04/10/2022) for Follow up, Post heart catheterization. Or sooner if needed. --Continue follow-up with your primary care physician  regarding  the management of your other chronic comorbid conditions.  Patient's questions and concerns were addressed to her satisfaction. She voices understanding of the instructions provided during this encounter.   This note was created using a voice recognition software as a result there may be grammatical errors inadvertently enclosed that do not reflect the nature of this encounter. Every attempt is made to correct such errors.  Total time spent: 45 minutes discussing recent heart failure exacerbation, the importance of medication compliance and knowing what medications she is taking on a daily basis, independently reviewed echoes from June 2022 and May 2023 with the patient and family, discussed the risks, benefits, and alternatives to heart catheterization, independently reviewed lipids and labs that are available in Care Everywhere.  Rex Kras, [place], [facility]  Pager: [phone] Office: [phone]  ADDENDUM Spoke to the patient over the phone today 03/14/2022 to review her medications which she forgot to bring at yesterday's office visit.  She is currently on Entresto 49/51 mg p.o. twice daily.  We will add spironolactone 25 mg p.o. daily.  Labs in 1 week to reevaluate kidney function and electrolytes.  Changes discussed with the patient and her daughter was present in the background as well.  Mechele [facility]  Pager: [phone] Office: [phone] 03/14/22 5:28 PM

## 2022-03-14 ENCOUNTER — Encounter: Payer: Self-pay | Admitting: Cardiology

## 2022-03-14 ENCOUNTER — Other Ambulatory Visit: Payer: Self-pay

## 2022-03-14 ENCOUNTER — Ambulatory Visit (INDEPENDENT_AMBULATORY_CARE_PROVIDER_SITE_OTHER): Payer: Medicare Other | Admitting: Plastic Surgery

## 2022-03-14 DIAGNOSIS — I428 Other cardiomyopathies: Secondary | ICD-10-CM

## 2022-03-14 DIAGNOSIS — Z9889 Other specified postprocedural states: Secondary | ICD-10-CM

## 2022-03-14 DIAGNOSIS — Z923 Personal history of irradiation: Secondary | ICD-10-CM

## 2022-03-14 MED ORDER — SPIRONOLACTONE 25 MG PO TABS: 25.0000 mg | ORAL_TABLET | Freq: Every morning | ORAL | 0 refills | Status: DC

## 2022-03-14 MED ORDER — ENTRESTO 49-51 MG PO TABS: 1.0000 | ORAL_TABLET | Freq: Two times a day (BID) | ORAL | 0 refills | Status: DC

## 2022-03-14 NOTE — Telephone Encounter (Signed)
Spoke to the patient over the phone day and took care of it.   Dr. Terri Skains

## 2022-03-14 NOTE — Progress Notes (Signed)
Patient presents about 6 weeks out from removal of bilateral tissue breast implants.  This was due to poor wound healing and drainage.  Ultimately she feels better but is still having a little bit of drainage coming out of the left side.  On exam she looks mostly healed but there is a couple small open areas on the left side.  I do not appreciate any obvious fluid collections subcutaneously and no signs of infection.  The areas are not unusually tender.  She is about to finish her course of antibiotics and I think we will plan to let her finish that out and not start any more for the time being.  We will plan to see her back in about a month to check her progress.  I am hopeful that the drainage will slow down and she says it has been decreasing over the past few weeks.

## 2022-03-14 NOTE — Addendum Note (Signed)
Addended by: Oran Rein on: 03/14/2022 05:29 PM   Modules accepted: Orders

## 2022-03-14 NOTE — Telephone Encounter (Signed)
Entresto matches the one on her list.

## 2022-03-26 ENCOUNTER — Encounter: Payer: Self-pay | Admitting: Rehabilitation

## 2022-03-26 ENCOUNTER — Ambulatory Visit: Payer: Medicare Other | Attending: Plastic Surgery | Admitting: Rehabilitation

## 2022-03-26 DIAGNOSIS — Z923 Personal history of irradiation: Secondary | ICD-10-CM | POA: Insufficient documentation

## 2022-03-26 DIAGNOSIS — Z9013 Acquired absence of bilateral breasts and nipples: Secondary | ICD-10-CM | POA: Diagnosis not present

## 2022-03-26 DIAGNOSIS — Z9189 Other specified personal risk factors, not elsewhere classified: Secondary | ICD-10-CM | POA: Insufficient documentation

## 2022-03-26 DIAGNOSIS — Z483 Aftercare following surgery for neoplasm: Secondary | ICD-10-CM | POA: Insufficient documentation

## 2022-03-26 DIAGNOSIS — Z9889 Other specified postprocedural states: Secondary | ICD-10-CM | POA: Diagnosis not present

## 2022-03-26 NOTE — Therapy (Signed)
OUTPATIENT PHYSICAL THERAPY ONCOLOGY EVALUATION  Patient Name: Jordan Willis MRN: [id number] DOB:Jan 31, 1951, 71 y.o., female Today's Date: 03/26/2022   PT End of Session - 03/26/22 1154     Visit Number 1    Number of Visits 13    Date for PT Re-Evaluation 05/21/22    PT Start Time 2376    PT Stop Time 1100    PT Time Calculation (min) 45 min    Activity Tolerance Patient tolerated treatment well    Behavior During Therapy WFL for tasks assessed/performed             Past Medical History:  Diagnosis Date   Anemia    Anginal pain ([place])    Anxiety    Arthritis    Lumbar spine DDD.   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   Diabetes mellitus without complication (HCC)    Type II   Dyspnea    When patient gets sick uses Pro-Air inhaler   Frequent sinus infections    GERD (gastroesophageal reflux disease)    occ   Goiter    History of cardiomegaly    History of cataract    Bilateral   History of kidney stones    08/28/2018 currently has a large kidney stone, asymptomatic at this time   History of migraine    History of uterine fibroid    History of uterine prolapse    Hyperlipidemia    Hypertension    LBBB (left bundle branch block)    Menopause 03/03/2012   Nasal turbinate hypertrophy 11/29/2016   Left inferior    Personal history of chemotherapy    Personal history of radiation therapy    Pneumonia    as a child   PONV (postoperative nausea and vomiting)    Sinusitis 2014   Sleep apnea    No CPAP   SUI (stress urinary incontinence, female)    SVD (spontaneous vaginal delivery)    x 2   Thyroid nodule    Past Surgical History:  Procedure Laterality Date   ABDOMINAL HYSTERECTOMY     BILATERAL SALPINGOOPHORECTOMY Bilateral    BLADDER SUSPENSION N/A 05/16/2017   Procedure:  TRANSVAGINAL TAPE (TVT) PROCEDURE;  Surgeon: Everett Graff, MD;  Location: [facility];  Service: Gynecology;  Laterality: N/A;   BREAST BIOPSY Right 2009   BREAST IMPLANT REMOVAL Bilateral 02/05/2022   Procedure: Removal bilateral breast implants;  Surgeon: [name], MD;  Location: [facility];  Service: Plastics;  Laterality: Bilateral;   BREAST LUMPECTOMY Right 2009   BREAST LUMPECTOMY Left 2006   BREAST RECONSTRUCTION WITH PLACEMENT OF TISSUE EXPANDER AND FLEX HD (ACELLULAR HYDRATED DERMIS) Bilateral 09/04/2021   Procedure: BILATERAL BREAST RECONSTRUCTION WITH PLACEMENT OF TISSUE EXPANDER AND FLEX HD (ACELLULAR HYDRATED DERMIS);  Surgeon: [name], MD;  Location: [facility];  Service: Plastics;  Laterality: Bilateral;   CARDIAC CATHETERIZATION     CATARACT EXTRACTION Left 10/23/2017   CATARACT EXTRACTION Right 11/06/2017   COLONOSCOPY  10/22/2009   Normal.  Repeat 5 years.  [name]   CYSTOSCOPY N/A 05/16/2017   Procedure: CYSTOSCOPY;  Surgeon: Everett Graff, MD;  Location: [facility];  Service: Gynecology;  Laterality: N/A;   DEBRIDEMENT AND CLOSURE WOUND Bilateral 10/03/2021   Procedure: Debridement bilateral mastectomy flaps;  Surgeon: Claudia Desanctis,  Steffanie Dunn, MD;  Location: [facility];  Service: Plastics;  Laterality: Bilateral;  1.5 hour   EYE SURGERY     bilateral cataracts   LEFT HEART CATH AND CORONARY ANGIOGRAPHY N/A 02/09/2020   Procedure: LEFT HEART CATH AND CORONARY ANGIOGRAPHY;  Surgeon: Nigel Mormon, MD;  Location: [facility];  Service: Cardiovascular;  Laterality: N/A;   MASTECTOMY W/ SENTINEL NODE BIOPSY Right 09/04/2021   Procedure: BILATERAL MASTECTOMIES WITH RIGHT SENTINEL LYMPH NODE BIOPSY;  Surgeon: Stark Klein, MD;  Location: [facility];  Service: General;  Laterality: Right;   REMOVAL OF BILATERAL TISSUE EXPANDERS WITH PLACEMENT OF BILATERAL BREAST IMPLANTS Bilateral 10/03/2021   Procedure: REMOVAL OF BILATERAL TISSUE EXPANDERS WITH REPLACEMENT OF TISSUE EXPANDERS;   Surgeon: [name], MD;  Location: [facility];  Service: Plastics;  Laterality: Bilateral;   REMOVAL OF BILATERAL TISSUE EXPANDERS WITH PLACEMENT OF BILATERAL BREAST IMPLANTS Bilateral 12/19/2021   Procedure: REMOVAL OF BILATERAL TISSUE EXPANDERS WITH PLACEMENT OF BILATERAL BREAST IMPLANTS;  Surgeon: [name], MD;  Location: [facility];  Service: Plastics;  Laterality: Bilateral;   ROBOTIC ASSISTED TOTAL HYSTERECTOMY N/A 05/16/2017   Procedure: ROBOTIC ASSISTED TOTAL HYSTERECTOMY;  Surgeon: Delsa Bern, MD;  Location: [facility];  Service: Gynecology;  Laterality: N/A;   ROTATOR CUFF REPAIR Bilateral    SHOULDER ARTHROSCOPY WITH ROTATOR CUFF REPAIR AND SUBACROMIAL DECOMPRESSION Right 09/03/2018   Procedure: Right shoulder manipulation under anesthesia, exam under anesthesia, mini open rotator cuff repair, subacromial decompression;  Surgeon: Susa Day, MD;  Location: [facility];  Service: Orthopedics;  Laterality: Right;  90 mins   TUBAL LIGATION     WISDOM TOOTH EXTRACTION     Patient Active Problem List   Diagnosis Date Noted   Breast cancer of upper-outer quadrant of right female breast ([place]) 09/04/2021   Diabetic neuropathy ([place]) 02/08/2021   Angina pectoris ([place]) 02/09/2020   Abnormal stress test 02/09/2020   Complete rotator cuff tear 09/03/2018   Pelvic prolapse 05/16/2017   Intractable chronic post-traumatic headache 11/19/2016   Malignant neoplasm of upper-outer quadrant of left breast in female, estrogen receptor negative ([place]) 03/03/2012   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   Menopause 03/03/2012    PCP: [name], MD  REFERRING PROVIDER: Mingo Amber, MD  REFERRING DIAG:  [phone] (ICD-10-CM) - S/P breast reconstruction  Z92.3 (ICD-10-CM) - Personal history of radiation therapy    THERAPY DIAG:  History of bilateral mastectomy  Aftercare following surgery for neoplasm  At risk for lymphedema  ONSET DATE: 09/04/21  Rationale for  Evaluation and Treatment Rehabilitation  SUBJECTIVE  SUBJECTIVE STATEMENT: Trouble reaching and it is tight  PERTINENT HISTORY:  Type 2 DM, cardiomyopathy, heart failure, recurrent breast cancer s/p bil mastectomy with Rt SLNB on 09/04/21 with [name].  0/6 positive lymph nodes removed.  Bilateral removal of implants 02/05/22 due to poor wound healing. Initial breast cancer treated in 2006 with chemotherapy/Lt lumpectomy/radiation.  Rt lumpectomy in 2009 with radiation on this side.  Currently on herceptin as able.    PAIN:  Are you having pain? Yes NPRS scale: 7/10 Pain location: Left side: the upper trap, neck, and chest hurt, and the Rt side only really when reach the arm up Pain orientation: Bilateral  PAIN TYPE: aching, sharp when picking up something  Pain description: intermittent and constant  Aggravating factors: using the arm, bending,   Relieving factors: pain medication, gabapentin, ibuprofen  PRECAUTIONS: lymphedema risk Rt UE, possibly Lt LUE unable to find information regarding status  WEIGHT BEARING RESTRICTIONS No  FALLS:  Has patient fallen in last 6 months? No  LIVING ENVIRONMENT: Lives with: lives alone and with 74 year old mother and my daughter is there helping Has following equipment: sometimes uses a walker  OCCUPATION: retired   LEISURE: trying to walking 77mn per day   HAND DOMINANCE : right   PRIOR LEVEL OF FUNCTION: Independent  PATIENT GOALS: reach the dishes and do yard work    OBJECTIVE  COGNITION:  Overall cognitive status: Within functional limits for tasks assessed   PALPATION:   OBSERVATIONS / OTHER ASSESSMENTS: wearing tank top with sanitary pad for drainage from the left lateral incision.  The Right side appears healed but the lateral 3inches of  the left incision are open with watery drainage from wound with padding off x 169m dripping down trunk.  Surgeon is aware of status.  See photo from today.   POSTURE: forward head, rounded shoulders   UPPER EXTREMITY AROM/PROM:  A/PROM RIGHT   eval   Shoulder extension 30  Shoulder flexion 97 - pectoralis muscle  Shoulder abduction 72  Shoulder internal rotation   Shoulder external rotation     (Blank rows = not tested)  A/PROM LEFT   eval  Shoulder extension 30  Shoulder flexion 75 -tight in upper shoulder   Shoulder abduction 55  Shoulder internal rotation   Shoulder external rotation     (Blank rows = not tested)   limited  Flexion   Extension   Right lateral flexion 25%  Left lateral flexion 25%  Right rotation 15%  Left rotation 15%     UPPER EXTREMITY STRENGTH: not tested today    LYMPHEDEMA ASSESSMENTS:   LANDMARK RIGHT  eval  10 cm proximal to olecranon process   Olecranon process   10 cm proximal to ulnar styloid process   Just proximal to ulnar styloid process   Across hand at thumb web space   At base of 2nd digit   (Blank rows = not tested)  LANDMARK LEFT  eval  10 cm proximal to olecranon process   Olecranon process   10 cm proximal to ulnar styloid process   Just proximal to ulnar styloid process   Across hand at thumb web space   At base of 2nd digit   (Blank rows = not tested)   FUNCTIONAL TESTS:  Not performed today due to EF  QUICK DASH SURVEY: 68%  TODAY'S TREATMENT  Reviewed focusing on wound healing and not stretching overhead much for now and how the right side is free to stretch  more Performed each exercise per below with cueing, demo, and instruction as needed  PATIENT EDUCATION:  Education details: per abve Person educated: Patient Education method: Explanation Education comprehension: verbalized understanding, returned demonstration, verbal cues required, tactile cues required, and needs further education  HOME  EXERCISE PROGRAM: Access Code: [email or web address] URL: [email or web address] Date: 03/26/2022 Prepared by: Shan Levans  Exercises - Seated Diaphragmatic Breathing - 2 x daily - 7 x weekly - 10 reps - Seated Scapular Clock (1 to 7) - 2 x daily - 7 x weekly - 10 reps - Seated Shoulder Rolls - 2 x daily - 7 x weekly - 10 reps - Seated Cervical Sidebending Stretch - 2 x daily - 7 x weekly - 1-3 sets - 3 reps - 10-20 seconds hold - Sit to Stand - 1-2 x daily - 7 x weekly - 2-10 reps - Standing Shoulder Flexion Wall Walk - 2 x daily - 7 x weekly - 1 sets - 3 reps - no hold  ASSESSMENT:  CLINICAL IMPRESSION: Patient is a 71 y.o. female who was seen today for physical therapy evaluation and treatment for her bilateral mastectomy care. She had had a long road of implant failure x 2 due to wound healing from DM and previous radiation.  Wound status is still compromised on the left lateral incision and will need care with stretching and more monitored PROM initially.  The Rt side is healing well.  Pt demonstrates muscular tightness in bil UT and neck musculature Lt>Rt, decreased fuctional reach, and pain with ADLs.    OBJECTIVE IMPAIRMENTS decreased activity tolerance, decreased ROM, decreased strength, impaired UE functional use, and pain.   ACTIVITY LIMITATIONS carrying, lifting, and reach over head  PARTICIPATION LIMITATIONS: meal prep, cleaning, laundry, community activity, and yard work  [place] Age and 1-2 comorbidities: previous radiation, DM,   are also affecting patient's functional outcome.   REHAB POTENTIAL: Good  CLINICAL DECISION MAKING: Stable/uncomplicated  EVALUATION COMPLEXITY: Low  GOALS: Goals reviewed with patient? Yes  SHORT TERM GOALS: = LTGs  LONG TERM GOALS: Target date: 05/21/2022    Pt will improve bil shoulder flexion AROM to at least 145 to improve reach into her cabinets Baseline:  Goal status: INITIAL  2.  Pt will decrease resting Lt UT region  pain to 1/10 or less Baseline: 7/10 Goal status: INITIAL  3.  Pt will be able to return to work in her yard without restructions Baseline:  Goal status: INITIAL  PLAN: PT FREQUENCY: 2x per week  PT DURATION: 8 weeks  PLANNED INTERVENTIONS: Therapeutic exercises, Patient/Family education, Manual therapy, and Re-evaluation  PLAN FOR NEXT SESSION: review seated exercises, perform supine breathing and PROM monitoring incision. Increase AAROM as able   Stark Bray, PT 03/26/2022, 11:55 AM

## 2022-03-27 ENCOUNTER — Encounter (HOSPITAL_COMMUNITY)
Admission: RE | Disposition: A | Discharge status: Home / Self Care | Payer: Self-pay | Source: Home / Self Care | Attending: Cardiology

## 2022-03-27 ENCOUNTER — Other Ambulatory Visit: Payer: Self-pay

## 2022-03-27 ENCOUNTER — Encounter (HOSPITAL_COMMUNITY): Payer: Self-pay | Admitting: Cardiology

## 2022-03-27 ENCOUNTER — Other Ambulatory Visit: Payer: Self-pay | Admitting: *Deleted

## 2022-03-27 ENCOUNTER — Ambulatory Visit (HOSPITAL_COMMUNITY)
Admission: RE | Admit: 2022-03-27 | Discharge: 2022-03-27 | Disposition: A | Discharge status: Home / Self Care | Payer: Medicare Other | Attending: Cardiology | Admitting: Cardiology

## 2022-03-27 DIAGNOSIS — Z79899 Other long term (current) drug therapy: Secondary | ICD-10-CM | POA: Insufficient documentation

## 2022-03-27 DIAGNOSIS — I110 Hypertensive heart disease with heart failure: Secondary | ICD-10-CM | POA: Insufficient documentation

## 2022-03-27 DIAGNOSIS — E119 Type 2 diabetes mellitus without complications: Secondary | ICD-10-CM | POA: Diagnosis not present

## 2022-03-27 DIAGNOSIS — Z171 Estrogen receptor negative status [ER-]: Secondary | ICD-10-CM | POA: Insufficient documentation

## 2022-03-27 DIAGNOSIS — C50412 Malignant neoplasm of upper-outer quadrant of left female breast: Secondary | ICD-10-CM | POA: Diagnosis not present

## 2022-03-27 DIAGNOSIS — I5042 Chronic combined systolic (congestive) and diastolic (congestive) heart failure: Secondary | ICD-10-CM | POA: Insufficient documentation

## 2022-03-27 DIAGNOSIS — I428 Other cardiomyopathies: Secondary | ICD-10-CM

## 2022-03-27 DIAGNOSIS — E782 Mixed hyperlipidemia: Secondary | ICD-10-CM | POA: Diagnosis not present

## 2022-03-27 DIAGNOSIS — R9439 Abnormal result of other cardiovascular function study: Secondary | ICD-10-CM | POA: Diagnosis not present

## 2022-03-27 DIAGNOSIS — C50411 Malignant neoplasm of upper-outer quadrant of right female breast: Secondary | ICD-10-CM

## 2022-03-27 DIAGNOSIS — Z923 Personal history of irradiation: Secondary | ICD-10-CM | POA: Insufficient documentation

## 2022-03-27 DIAGNOSIS — Z9221 Personal history of antineoplastic chemotherapy: Secondary | ICD-10-CM | POA: Insufficient documentation

## 2022-03-27 DIAGNOSIS — I427 Cardiomyopathy due to drug and external agent: Secondary | ICD-10-CM

## 2022-03-27 DIAGNOSIS — Z7984 Long term (current) use of oral hypoglycemic drugs: Secondary | ICD-10-CM | POA: Insufficient documentation

## 2022-03-27 DIAGNOSIS — I447 Left bundle-branch block, unspecified: Secondary | ICD-10-CM | POA: Diagnosis not present

## 2022-03-27 DIAGNOSIS — Z5181 Encounter for therapeutic drug level monitoring: Secondary | ICD-10-CM

## 2022-03-27 HISTORY — PX: LEFT HEART CATH AND CORONARY ANGIOGRAPHY: CATH118249

## 2022-03-27 LAB — CBC
HCT: 39.5 % (ref 36.0–46.0)
Hemoglobin: 12.5 g/dL (ref 12.0–15.0)
MCH: 27.8 pg (ref 26.0–34.0)
MCHC: 31.6 g/dL (ref 30.0–36.0)
MCV: 87.8 fL (ref 80.0–100.0)
Platelets: 296 10*3/uL (ref 150–400)
RBC: 4.5 MIL/uL (ref 3.87–5.11)
RDW: 14.2 % (ref 11.5–15.5)
WBC: 4.7 10*3/uL (ref 4.0–10.5)
nRBC: 0 % (ref 0.0–0.2)

## 2022-03-27 LAB — BASIC METABOLIC PANEL
Anion gap: 9 (ref 5–15)
BUN: 9 mg/dL (ref 8–23)
CO2: 24 mmol/L (ref 22–32)
Calcium: 9.8 mg/dL (ref 8.9–10.3)
Chloride: 107 mmol/L (ref 98–111)
Creatinine, Ser: 0.65 mg/dL (ref 0.44–1.00)
GFR, Estimated: >60 mL/min (ref >60)
Glucose, Bld: 120 mg/dL — ABNORMAL HIGH (ref 70–99)
Potassium: 3.8 mmol/L (ref 3.5–5.1)
Sodium: 140 mmol/L (ref 135–145)

## 2022-03-27 LAB — GLUCOSE, CAPILLARY
Glucose-Capillary: 117 mg/dL — ABNORMAL HIGH (ref 70–99)
Glucose-Capillary: 113 mg/dL — ABNORMAL HIGH (ref 70–99)

## 2022-03-27 SURGERY — LEFT HEART CATH AND CORONARY ANGIOGRAPHY
Anesthesia: LOCAL

## 2022-03-27 MED ORDER — HEPARIN SODIUM (PORCINE) 1000 UNIT/ML IJ SOLN
INTRAMUSCULAR | Status: AC
  Filled 2022-03-27: qty 10

## 2022-03-27 MED ORDER — LIDOCAINE HCL (PF) 1 % IJ SOLN
INTRAMUSCULAR | Status: AC
  Filled 2022-03-27: qty 30

## 2022-03-27 MED ORDER — SODIUM CHLORIDE 0.9 % IV SOLN: INTRAVENOUS | Status: DC

## 2022-03-27 MED ORDER — SODIUM CHLORIDE 0.9% FLUSH: 3.0000 mL | INTRAVENOUS | Status: DC | PRN

## 2022-03-27 MED ORDER — FENTANYL CITRATE (PF) 100 MCG/2ML IJ SOLN
INTRAMUSCULAR | Status: AC
  Filled 2022-03-27: qty 2

## 2022-03-27 MED ORDER — SODIUM CHLORIDE 0.9% FLUSH: 3.0000 mL | Freq: Two times a day (BID) | INTRAVENOUS | Status: DC

## 2022-03-27 MED ORDER — MIDAZOLAM HCL 2 MG/2ML IJ SOLN
INTRAMUSCULAR | Status: AC
  Filled 2022-03-27: qty 2

## 2022-03-27 MED ORDER — SODIUM CHLORIDE 0.9 % IV SOLN: 250.0000 mL | INTRAVENOUS | Status: DC | PRN

## 2022-03-27 MED ORDER — IOHEXOL 350 MG/ML SOLN
INTRAVENOUS | Status: DC | PRN
  Administered 2022-03-27: 45 mL

## 2022-03-27 MED ORDER — ASPIRIN 81 MG PO CHEW
81.0000 mg | CHEWABLE_TABLET | ORAL | Status: AC
  Administered 2022-03-27: 81 mg via ORAL
  Filled 2022-03-27: qty 1

## 2022-03-27 MED ORDER — VERAPAMIL HCL 2.5 MG/ML IV SOLN
INTRAVENOUS | Status: DC | PRN
  Administered 2022-03-27: 10 mL via INTRA_ARTERIAL

## 2022-03-27 MED ORDER — HEPARIN (PORCINE) IN NACL 1000-0.9 UT/500ML-% IV SOLN
INTRAVENOUS | Status: AC
  Filled 2022-03-27: qty 1000

## 2022-03-27 MED ORDER — FENTANYL CITRATE (PF) 100 MCG/2ML IJ SOLN
INTRAMUSCULAR | Status: DC | PRN
Start: 2022-03-27 — End: 2022-03-27
  Administered 2022-03-27: 25 ug via INTRAVENOUS

## 2022-03-27 MED ORDER — MIDAZOLAM HCL 2 MG/2ML IJ SOLN
INTRAMUSCULAR | Status: DC | PRN
  Administered 2022-03-27: 2 mg via INTRAVENOUS

## 2022-03-27 MED ORDER — ACETAMINOPHEN 325 MG PO TABS: 650.0000 mg | ORAL_TABLET | ORAL | Status: DC | PRN

## 2022-03-27 MED ORDER — LIDOCAINE HCL (PF) 1 % IJ SOLN
INTRAMUSCULAR | Status: DC | PRN
  Administered 2022-03-27: 2 mL

## 2022-03-27 MED ORDER — HEPARIN SODIUM (PORCINE) 1000 UNIT/ML IJ SOLN
INTRAMUSCULAR | Status: DC | PRN
  Administered 2022-03-27: 3500 [IU] via INTRAVENOUS

## 2022-03-27 MED ORDER — ONDANSETRON HCL 4 MG/2ML IJ SOLN: 4.0000 mg | Freq: Four times a day (QID) | INTRAMUSCULAR | Status: DC | PRN

## 2022-03-27 MED ORDER — HEPARIN (PORCINE) IN NACL 1000-0.9 UT/500ML-% IV SOLN
INTRAVENOUS | Status: DC | PRN
  Administered 2022-03-27 (×2): 500 mL

## 2022-03-27 MED ORDER — VERAPAMIL HCL 2.5 MG/ML IV SOLN
INTRAVENOUS | Status: AC
  Filled 2022-03-27: qty 2

## 2022-03-27 SURGICAL SUPPLY — 10 items
BAND CMPR LRG ZPHR (HEMOSTASIS) ×1
BAND ZEPHYR COMPRESS 30 LONG (HEMOSTASIS) ×1 IMPLANT
CATH OPTITORQUE TIG 4.0 5F (CATHETERS) ×1 IMPLANT
GLIDESHEATH SLEND A-KIT 6F 22G (SHEATH) ×1 IMPLANT
GUIDEWIRE INQWIRE 1.5J.035X260 (WIRE) IMPLANT
INQWIRE 1.5J .035X260CM (WIRE) ×2
KIT HEART LEFT (KITS) ×2 IMPLANT
PACK CARDIAC CATHETERIZATION (CUSTOM PROCEDURE TRAY) ×2 IMPLANT
TRANSDUCER W/STOPCOCK (MISCELLANEOUS) ×2 IMPLANT
TUBING CIL FLEX 10 FLL-RA (TUBING) ×2 IMPLANT

## 2022-03-27 NOTE — Interval H&P Note (Signed)
History and Physical Interval Note:  03/27/2022 9:09 AM  Jordan Willis  has presented today for surgery, with the diagnosis of cardiomyopathy.  The various methods of treatment have been discussed with the patient and family. After consideration of risks, benefits and other options for treatment, the patient has consented to  Procedure(s): LEFT HEART CATH AND CORONARY ANGIOGRAPHY (N/A) and possible angioplasty as a surgical intervention.  The patient's history has been reviewed, patient examined, no change in status, stable for surgery.  I have reviewed the patient's chart and labs.  Questions were answered to the patient's satisfaction.     Adrian Prows

## 2022-03-27 NOTE — Progress Notes (Signed)
Patient and son was given discharge instructions. Both verbalized understanding. 

## 2022-03-28 ENCOUNTER — Inpatient Hospital Stay: Payer: Medicare Other

## 2022-03-28 ENCOUNTER — Inpatient Hospital Stay: Payer: Medicare Other | Attending: Hematology and Oncology | Admitting: Adult Health

## 2022-03-28 ENCOUNTER — Encounter: Payer: Self-pay | Admitting: *Deleted

## 2022-03-28 ENCOUNTER — Encounter: Payer: Self-pay | Admitting: Adult Health

## 2022-03-28 VITALS — BP 130/63 | HR 76 | Temp 97.1°F | Resp 18 | Ht 67.5 in | Wt 187.9 lb

## 2022-03-28 DIAGNOSIS — Z171 Estrogen receptor negative status [ER-]: Secondary | ICD-10-CM | POA: Insufficient documentation

## 2022-03-28 DIAGNOSIS — Z803 Family history of malignant neoplasm of breast: Secondary | ICD-10-CM | POA: Insufficient documentation

## 2022-03-28 DIAGNOSIS — Z5112 Encounter for antineoplastic immunotherapy: Secondary | ICD-10-CM | POA: Diagnosis present

## 2022-03-28 DIAGNOSIS — R5383 Other fatigue: Secondary | ICD-10-CM | POA: Diagnosis not present

## 2022-03-28 DIAGNOSIS — C50412 Malignant neoplasm of upper-outer quadrant of left female breast: Secondary | ICD-10-CM | POA: Insufficient documentation

## 2022-03-28 DIAGNOSIS — Z170 Estrogen receptor positive status [ER+]: Secondary | ICD-10-CM

## 2022-03-28 DIAGNOSIS — Z9013 Acquired absence of bilateral breasts and nipples: Secondary | ICD-10-CM | POA: Insufficient documentation

## 2022-03-28 LAB — CMP (CANCER CENTER ONLY)
ALT: 11 U/L (ref 0–44)
AST: 15 U/L (ref 15–41)
Albumin: 4.5 g/dL (ref 3.5–5.0)
Alkaline Phosphatase: 89 U/L (ref 38–126)
Anion gap: 8 (ref 5–15)
BUN: 10 mg/dL (ref 8–23)
CO2: 24 mmol/L (ref 22–32)
Calcium: 9.8 mg/dL (ref 8.9–10.3)
Chloride: 106 mmol/L (ref 98–111)
Creatinine: 0.62 mg/dL (ref 0.44–1.00)
GFR, Estimated: >60 mL/min (ref >60)
Glucose, Bld: 115 mg/dL — ABNORMAL HIGH (ref 70–99)
Potassium: 3.7 mmol/L (ref 3.5–5.1)
Sodium: 138 mmol/L (ref 135–145)
Total Bilirubin: 0.4 mg/dL (ref 0.3–1.2)
Total Protein: 7.9 g/dL (ref 6.5–8.1)

## 2022-03-28 LAB — CBC WITH DIFFERENTIAL (CANCER CENTER ONLY)
Abs Immature Granulocytes: 0.01 10*3/uL (ref 0.00–0.07)
Basophils Absolute: 0 10*3/uL (ref 0.0–0.1)
Basophils Relative: 1 %
Eosinophils Absolute: 0.1 10*3/uL (ref 0.0–0.5)
Eosinophils Relative: 4 %
HCT: 39 % (ref 36.0–46.0)
Hemoglobin: 12.5 g/dL (ref 12.0–15.0)
Immature Granulocytes: 0 %
Lymphocytes Relative: 39 %
Lymphs Abs: 1.5 10*3/uL (ref 0.7–4.0)
MCH: 27.9 pg (ref 26.0–34.0)
MCHC: 32.1 g/dL (ref 30.0–36.0)
MCV: 87.1 fL (ref 80.0–100.0)
Monocytes Absolute: 0.3 10*3/uL (ref 0.1–1.0)
Monocytes Relative: 7 %
Neutro Abs: 1.9 10*3/uL (ref 1.7–7.7)
Neutrophils Relative %: 49 %
Platelet Count: 285 10*3/uL (ref 150–400)
RBC: 4.48 MIL/uL (ref 3.87–5.11)
RDW: 14.3 % (ref 11.5–15.5)
WBC Count: 3.8 10*3/uL — ABNORMAL LOW (ref 4.0–10.5)
nRBC: 0 % (ref 0.0–0.2)

## 2022-03-28 NOTE — Assessment & Plan Note (Signed)
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% status post lumpectomy radiation and 5 years of tamoxifen completed 04/16/2009 Recurrence: 05/23/2021: Screening mammogram detected right breast mass 10 o'clock position 1.2 cm, biopsy with grade 3 IDC ER 20%, PR 0%, HER2 positive, Ki-67 5%) ---------------------------------------------------------------------------------------------------------------------------------------------------- Current treatment:  Herceptin on hold  Jordan Willis will not receive further Herceptin at this time due to her decreased ejection fraction to 30 to 35%.  She will follow-up in 4 weeks for labs and discussion with Dr. Gudena to review any improvement in her echocardiogram and discuss potential other treatment options if available.  I recommended she proceed with her follow-ups with cardiology and plastic surgery as scheduled and she plans on doing so.  

## 2022-03-28 NOTE — Progress Notes (Signed)
Jordan Willis:    [name], MD [address] [place] [address]   DIAGNOSIS:  Cancer Staging  Malignant neoplasm of upper-outer quadrant of left breast in female, estrogen receptor negative ([place]) Staging form: Breast, AJCC 7th Edition - Clinical: Stage IA (rT1c, N0, M0) - Signed by Nicholas Lose, MD on 05/31/2021 Stage prefix: Recurrence Laterality: Right Biopsy of metastatic site performed: No Estrogen receptor status: Positive Progesterone receptor status: Negative HER2 status: Positive - Pathologic: Stage IIA (T2, N0, cM0) - Signed by Nicholas Lose, MD on 09/12/2021 Estrogen receptor status: Positive Progesterone receptor status: Positive HER2 status: Negative   SUMMARY OF ONCOLOGIC HISTORY: Oncology History  Malignant neoplasm of upper-outer quadrant of left breast in female, estrogen receptor negative ([facility])  02/02/2005 Initial Diagnosis   Left breast needle core biopsy showed invasive mammary cancer    02/11/2005 Breast MRI   Left breast upper outer quadrant: 4.4 x 2.6 x 3.7 cm mass and a smaller adjacent mass measuring 1 x 0.8 x 0.4 cm    03/02/2005 - 07/20/2005 Neo-Adjuvant Chemotherapy   AC4 followed by Taxotere 4   08/03/2005 Surgery   Left breast lumpectomy: T1 cN0 M0 stage IA 1.2 cm metaplastic invasive ductal Jordan Willis grade 3 ER 0%, PR 0%, HER-2 negative, Ki-67 31%    09/26/2005 - 11/13/2005 Radiation Therapy   Adjuvant radiation therapy    03/18/2008 Initial Biopsy   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    03/22/2008 Surgery   Right breast lumpectomy 0.9 cm DCIS ER 90% PR 98% stage 0    04/11/2008 Procedure   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    05/10/2008 - 07/02/2008 Radiation Therapy   Adjuvant radiation therapy    07/17/2008 - 04/16/2009 Anti-estrogen oral therapy   Femara then switched to tamoxifen which was  discontinued in 2010 due to concern about uterine cancer    05/23/2021 Relapse/Recurrence   Screening detected right breast mass at 10 o'clock position measuring 1.2 cm, ultrasound-guided biopsy revealed grade 3 IDC ER 20%, PR 0%, HER2 positive 3+, Ki-67 50%   05/31/2021 Cancer Staging   Staging form: Breast, AJCC 7th Edition - Clinical: Stage IA (rT1c, N0, M0) - Signed by Nicholas Lose, MD on 05/31/2021 Stage prefix: Recurrence Laterality: Right Biopsy of metastatic site performed: No Estrogen receptor status: Positive Progesterone receptor status: Negative HER2 status: Positive    09/04/2021 Surgery   Bilateral mastectomies Left mastectomy: Benign Right mastectomy: Grade 3 IDC with DCIS 3.6 cm, 0/6 lymph nodes negative, ER 20%, PR 0%, HER2 positive by IHC, Ki-67 50%   09/12/2021 Cancer Staging   Staging form: Breast, AJCC 7th Edition - Pathologic: Stage IIA (T2, N0, cM0) - Signed by Nicholas Lose, MD on 09/12/2021 Estrogen receptor status: Positive Progesterone receptor status: Positive HER2 status: Negative    Breast cancer of upper-outer quadrant of right female breast ([place])  09/04/2021 Initial Diagnosis   Breast cancer of upper-outer quadrant of right female breast ([place])    11/21/2021 -  Chemotherapy   Patient is on Treatment Plan : BREAST Trastuzumab q21d        CURRENT THERAPY: Trastuzumab  INTERVAL HISTORY: [name] 71 y.o. female returns for follow-Willis prior to receiving Herceptin.  She underwent a left heart cath yesterday and it showed a left ventricular ejection fraction of 30 to 35% with global hypokinesis.  [name] is feeling moderately well.  She tells me that she  tolerated the procedure well and her left breast continues to slowly heal.  She has follow-Willis with cardiology on June 22 and [name] on June 26.  She has begun walking 30 minutes most days of the week at the recommendation of cardiology.  Her medication has been increased and she and her daughter  indicate their understanding that hopefully the ejection fraction will improve so she can resume treatment.   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  MEDICAL HISTORY: Past Medical History:  Diagnosis Date   Anemia    Anginal pain (HCC)    Anxiety    Arthritis    Lumbar spine DDD.   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, right 03/03/2012   Diabetes mellitus without complication (HCC)    Type II   Dyspnea    When patient gets sick uses Pro-Air inhaler   Frequent sinus infections    GERD (gastroesophageal reflux disease)    occ   Goiter    History of cardiomegaly    History of cataract    Bilateral   History of kidney stones    08/28/2018 currently has a large kidney stone, asymptomatic at this time   History of migraine    History of uterine fibroid    History of uterine prolapse    Hyperlipidemia    Hypertension    LBBB (left bundle branch block)    Menopause 03/03/2012   Nasal turbinate hypertrophy 11/29/2016   Left inferior    Personal history of chemotherapy    Personal history of radiation therapy    Pneumonia    as a child   PONV  (postoperative nausea and vomiting)    Sinusitis 2014   Sleep apnea    No CPAP   SUI (stress urinary incontinence, female)    SVD (spontaneous vaginal delivery)    x 2   Thyroid nodule     SURGICAL HISTORY: Past Surgical History:  Procedure Laterality Date   ABDOMINAL HYSTERECTOMY     BILATERAL SALPINGOOPHORECTOMY Bilateral    BLADDER SUSPENSION N/A 05/16/2017   Procedure: TRANSVAGINAL TAPE (TVT) PROCEDURE;  Surgeon: Everett Graff, MD;  Location: [facility];  Service: Gynecology;  Laterality: N/A;   BREAST BIOPSY Right 2009   BREAST IMPLANT REMOVAL Bilateral 02/05/2022   Procedure: Removal bilateral breast implants;  Surgeon: [name], MD;  Location: [facility];  Service: Plastics;  Laterality: Bilateral;   BREAST LUMPECTOMY Right 2009   BREAST LUMPECTOMY Left 2006   BREAST RECONSTRUCTION WITH PLACEMENT OF TISSUE EXPANDER AND FLEX HD (ACELLULAR HYDRATED DERMIS) Bilateral 09/04/2021   Procedure: BILATERAL BREAST RECONSTRUCTION WITH PLACEMENT OF TISSUE EXPANDER AND FLEX HD (ACELLULAR HYDRATED DERMIS);  Surgeon: [name],  MD;  Location: [facility];  Service: Plastics;  Laterality: Bilateral;   CARDIAC CATHETERIZATION     CATARACT EXTRACTION Left 10/23/2017   CATARACT EXTRACTION Right 11/06/2017   COLONOSCOPY  10/22/2009   Normal.  Repeat 5 years.  [name]   CYSTOSCOPY N/A 05/16/2017   Procedure: CYSTOSCOPY;  Surgeon: Everett Graff, MD;  Location: [facility];  Service: Gynecology;  Laterality: N/A;   DEBRIDEMENT AND CLOSURE WOUND Bilateral 10/03/2021   Procedure: Debridement bilateral mastectomy flaps;  Surgeon: [name], MD;  Location: [facility];  Service: Plastics;  Laterality: Bilateral;  1.5 hour   EYE SURGERY     bilateral cataracts   LEFT HEART CATH AND CORONARY ANGIOGRAPHY N/A 02/09/2020   Procedure: LEFT HEART CATH AND CORONARY ANGIOGRAPHY;  Surgeon: Nigel Mormon, MD;  Location: [facility];  Service: Cardiovascular;  Laterality: N/A;   LEFT HEART CATH AND  CORONARY ANGIOGRAPHY N/A 03/27/2022   Procedure: LEFT HEART CATH AND CORONARY ANGIOGRAPHY;  Surgeon: Adrian Prows, MD;  Location: [facility];  Service: Cardiovascular;  Laterality: N/A;   MASTECTOMY W/ SENTINEL NODE BIOPSY Right 09/04/2021   Procedure: BILATERAL MASTECTOMIES WITH RIGHT SENTINEL LYMPH NODE BIOPSY;  Surgeon: Stark Klein, MD;  Location: [facility];  Service: General;  Laterality: Right;   REMOVAL OF BILATERAL TISSUE EXPANDERS WITH PLACEMENT OF BILATERAL BREAST IMPLANTS Bilateral 10/03/2021   Procedure: REMOVAL OF BILATERAL TISSUE EXPANDERS WITH REPLACEMENT OF TISSUE EXPANDERS;  Surgeon: [name], MD;  Location: [facility];  Service: Plastics;  Laterality: Bilateral;   REMOVAL OF BILATERAL TISSUE EXPANDERS WITH PLACEMENT OF BILATERAL BREAST IMPLANTS Bilateral 12/19/2021   Procedure: REMOVAL OF BILATERAL TISSUE EXPANDERS WITH PLACEMENT OF BILATERAL BREAST IMPLANTS;  Surgeon: [name], MD;  Location: [facility];  Service: Plastics;  Laterality: Bilateral;   ROBOTIC ASSISTED TOTAL HYSTERECTOMY N/A 05/16/2017   Procedure: ROBOTIC ASSISTED TOTAL HYSTERECTOMY;  Surgeon: Delsa Bern, MD;  Location: [facility];  Service: Gynecology;  Laterality: N/A;   ROTATOR CUFF REPAIR Bilateral    SHOULDER ARTHROSCOPY WITH ROTATOR CUFF REPAIR AND SUBACROMIAL DECOMPRESSION Right 09/03/2018   Procedure: Right shoulder manipulation under anesthesia, exam under anesthesia, mini open rotator cuff repair, subacromial decompression;  Surgeon: Susa Day, MD;  Location: [facility];  Service: Orthopedics;  Laterality: Right;  90 mins   TUBAL LIGATION     WISDOM TOOTH EXTRACTION      SOCIAL HISTORY: Social History   Socioeconomic History   Marital status: Divorced    Spouse name: n/a   Number of children: 2   Years of education: 12   Highest education level: Not on file  Occupational History   Occupation: Retired    Fish farm manager: [employer]    Comment: Psychologist, forensic  Tobacco Use   Smoking status:  Never   Smokeless tobacco: Never  Vaping Use   Vaping Use: Never used  Substance and Sexual Activity   Alcohol use: No   Drug use: No   Sexual activity: Not Currently    Partners: Male    Birth control/protection: Surgical, Post-menopausal    Comment: Hysterectomy  Other Topics Concern   Not on file  Social History Narrative   Marital status: divorced; dating seriously x many years.       Children:  2 children; 1 grandchild.      Employment: Personnel officer.      Lives: alone      Tobacco: none       Alcohol: none  Exercise:  Walking with sister two days per week.      Seatbelt: 100%      Guns: none      Right-handed   Caffeine: occasional coffee, hot tea or green tea a few times per week   Social Determinants of Health   Financial Resource Strain: Not on file  Food Insecurity: Not on file  Transportation Needs: Not on file  Physical Activity: Not on file  Stress: Not on file  Social Connections: Not on file  Intimate Partner Violence: Not on file    FAMILY HISTORY: Family History  Problem Relation Age of Onset   Cancer Mother 70       breast cancer   Hypertension Mother    Diabetes Father    Cancer Sister        breast cancer   Breast cancer Sister    Cancer Sister        breast cancer   Breast cancer Sister     Review of Systems  Constitutional:  Positive for fatigue. Negative for appetite change, chills, fever and unexpected weight change.  HENT:   Negative for hearing loss, lump/mass and trouble swallowing.   Eyes:  Negative for eye problems and icterus.  Respiratory:  Negative for chest tightness, cough and shortness of breath.   Cardiovascular:  Negative for chest pain, leg swelling and palpitations.  Gastrointestinal:  Negative for abdominal distention, abdominal pain, constipation, diarrhea, nausea and vomiting.  Endocrine: Negative for hot flashes.  Genitourinary:  Negative for difficulty urinating.   Musculoskeletal:  Negative for  arthralgias.  Skin:  Negative for itching and rash.  Neurological:  Negative for dizziness, extremity weakness, headaches and numbness.  Hematological:  Negative for adenopathy. Does not bruise/bleed easily.  Psychiatric/Behavioral:  Negative for depression. The patient is not nervous/anxious.      PHYSICAL EXAMINATION  ECOG PERFORMANCE STATUS: 1 - Symptomatic but completely ambulatory  Vitals:   03/28/22 0825  BP: 130/63  Pulse: 76  Resp: 18  Temp: (!) 97.1 F (36.2 C)  SpO2: 99%    Physical Exam Constitutional:      General: She is not in acute distress.    Appearance: Normal appearance. She is not toxic-appearing.  HENT:     Head: Normocephalic and atraumatic.  Eyes:     General: No scleral icterus. Cardiovascular:     Rate and Rhythm: Normal rate and regular rhythm.     Pulses: Normal pulses.     Heart sounds: Normal heart sounds.  Pulmonary:     Effort: Pulmonary effort is normal.     Breath sounds: Normal breath sounds.  Abdominal:     General: Abdomen is flat. Bowel sounds are normal. There is no distension.     Palpations: Abdomen is soft.     Tenderness: There is no abdominal tenderness.  Musculoskeletal:        General: No swelling.     Cervical back: Neck supple.  Lymphadenopathy:     Cervical: No cervical adenopathy.  Skin:    General: Skin is warm and dry.     Findings: No rash.  Neurological:     General: No focal deficit present.     Mental Status: She is alert.  Psychiatric:        Mood and Affect: Mood normal.        Behavior: Behavior normal.    LABORATORY DATA:  CBC    Component Value Date/Time   WBC 3.8 (L) 03/28/2022 0960  WBC 4.7 03/27/2022 0732   RBC 4.48 03/28/2022 0758   HGB 12.5 03/28/2022 0758   HGB 14.9 10/06/2013 0859   HCT 39.0 03/28/2022 0758   HCT 43.2 10/06/2013 0859   PLT 285 03/28/2022 0758   PLT 295 10/06/2013 0859   MCV 87.1 03/28/2022 0758   MCV 94.3 11/24/2013 0859   MCV 88.9 10/06/2013 0859   MCH 27.9  03/28/2022 0758   MCHC 32.1 03/28/2022 0758   RDW 14.3 03/28/2022 0758   RDW 13.0 10/06/2013 0859   LYMPHSABS 1.5 03/28/2022 0758   LYMPHSABS 2.6 10/06/2013 0859   MONOABS 0.3 03/28/2022 0758   MONOABS 0.4 10/06/2013 0859   EOSABS 0.1 03/28/2022 0758   EOSABS 0.2 10/06/2013 0859   BASOSABS 0.0 03/28/2022 0758   BASOSABS 0.1 10/06/2013 0859    CMP     Component Value Date/Time   NA 138 03/28/2022 0758   NA 139 03/06/2022 0807   NA 139 10/06/2013 0900   K 3.7 03/28/2022 0758   K 3.6 10/06/2013 0900   CL 106 03/28/2022 0758   CL 104 02/20/2013 1109   CO2 24 03/28/2022 0758   CO2 24 10/06/2013 0900   GLUCOSE 115 (H) 03/28/2022 0758   GLUCOSE 106 10/06/2013 0900   GLUCOSE 98 02/20/2013 1109   BUN 10 03/28/2022 0758   BUN 11 03/06/2022 0807   BUN 10.4 10/06/2013 0900   CREATININE 0.62 03/28/2022 0758   CREATININE 0.7 10/06/2013 0900   CALCIUM 9.8 03/28/2022 0758   CALCIUM 10.3 10/06/2013 0900   PROT 7.9 03/28/2022 0758   PROT 8.6 (H) 10/06/2013 0900   ALBUMIN 4.5 03/28/2022 0758   ALBUMIN 4.4 10/06/2013 0900   AST 15 03/28/2022 0758   AST 22 10/06/2013 0900   ALT 11 03/28/2022 0758   ALT 25 10/06/2013 0900   ALKPHOS 89 03/28/2022 0758   ALKPHOS 133 10/06/2013 0900   BILITOT 0.4 03/28/2022 0758   BILITOT 0.24 10/06/2013 0900   GFRNONAA >60 03/28/2022 0758   GFRAA 111 10/13/2020 1001      RADIOGRAPHIC STUDIES:  CARDIAC CATHETERIZATION  Result Date: 03/27/2022 Left Heart Catheterization 03/27/22: LV: 118/3, EDP 11 mmHg.  Ao 128/56, mean 84 mmHg.  No pressure gradient across the aortic valve. LVEF 30 to 35% with global hypokinesis.  LV is mildly dilated. LM: Large caliber vessels, smooth and normal. LAD: Large vessel, gives origin to several small diagonals.  Smooth and normal.  Mid to distal segment is tortuous and may suggest hypertensive heart disease. RI: Large vessel, gives origin to large lateral branches.  Smooth and normal. CX: Large vessel, gives origin to  moderate-sized OM1, 2 and continues as OM 3.  Smooth and normal. RCA: Dominant, continues as a PDA.  Smooth and normal. Impression: Findings consistent with nonischemic dilated cardiomyopathy.  Severe LV systolic dysfunction.        ASSESSMENT and THERAPY PLAN:   Malignant neoplasm of upper-outer quadrant of left breast in female, estrogen receptor negative ([place]) 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, 0/6 lymph nodes negative, ER 20%, PR 0%, HER2 positive by IHC, Ki-67 50%   (2006: Left breast invasive ductal Jordan Willis ER PR negative HER-2 negative grade 3 status post neo adjuvant chemotherapy with FEC 4 followed by Taxotere 4 completed 07/20/2005 status post lumpectomy 1.2 cm, grade 3, triple negative, Ki-67 31%, status post radiation completed 11/13/2005 2009: 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%, PR 98% status post lumpectomy radiation and 5 years of  tamoxifen completed 04/16/2009 Recurrence: 05/23/2021: Screening mammogram detected right breast mass 10 o'clock position 1.2 cm, biopsy with grade 3 IDC ER 20%, PR 0%, HER2 positive, Ki-67 5%) ---------------------------------------------------------------------------------------------------------------------------------------------------- Current treatment:  Herceptin on hold  Shadasia will not receive further Herceptin at this time due to her decreased ejection fraction to 30 to 35%.  She will follow-Willis in 4 weeks for labs and discussion with [name] to review any improvement in her echocardiogram and discuss potential other treatment options if available.  I recommended she proceed with her follow-ups with cardiology and plastic surgery as scheduled and she plans on doing so.   All questions were answered. The patient knows to call the clinic with any problems, questions or concerns. We can certainly see the patient much sooner if necessary.  Total encounter time:20 minutes*in  face-to-face visit time, chart review, lab review, care coordination, order entry, and documentation of the encounter time.  Wilber Bihari, NP 03/28/22 9:31 AM Medical Oncology and Hematology [facility] [place],  [address] Tel. [phone]    Fax. [phone]  *Total Encounter Time as defined by the Centers for Medicare and Medicaid Services includes, in addition to the face-to-face time of a patient visit (documented in the note above) non-face-to-face time: obtaining and reviewing outside history, ordering and reviewing medications, tests or procedures, care coordination (communications with other health care professionals or caregivers) and documentation in the medical record.

## 2022-04-02 ENCOUNTER — Encounter: Payer: Self-pay | Admitting: *Deleted

## 2022-04-03 ENCOUNTER — Other Ambulatory Visit: Payer: Self-pay | Admitting: Cardiology

## 2022-04-09 ENCOUNTER — Encounter: Payer: Self-pay | Admitting: Cardiology

## 2022-04-09 ENCOUNTER — Ambulatory Visit: Payer: Medicare Other | Admitting: Cardiology

## 2022-04-09 VITALS — BP 128/75 | HR 91 | Temp 98.5°F | Resp 16 | Ht 67.0 in | Wt 185.4 lb

## 2022-04-09 DIAGNOSIS — C50412 Malignant neoplasm of upper-outer quadrant of left female breast: Secondary | ICD-10-CM

## 2022-04-09 DIAGNOSIS — I428 Other cardiomyopathies: Secondary | ICD-10-CM

## 2022-04-09 DIAGNOSIS — Z171 Estrogen receptor negative status [ER-]: Secondary | ICD-10-CM | POA: Diagnosis not present

## 2022-04-09 DIAGNOSIS — E782 Mixed hyperlipidemia: Secondary | ICD-10-CM

## 2022-04-09 DIAGNOSIS — I5042 Chronic combined systolic (congestive) and diastolic (congestive) heart failure: Secondary | ICD-10-CM | POA: Diagnosis not present

## 2022-04-09 DIAGNOSIS — E119 Type 2 diabetes mellitus without complications: Secondary | ICD-10-CM

## 2022-04-09 DIAGNOSIS — I10 Essential (primary) hypertension: Secondary | ICD-10-CM

## 2022-04-09 DIAGNOSIS — I447 Left bundle-branch block, unspecified: Secondary | ICD-10-CM | POA: Diagnosis not present

## 2022-04-09 NOTE — Progress Notes (Addendum)
Jordan Willis Date of Birth: 07-02-1951 MRN: [id number] Primary Care Provider:Pharr, Thayer Jew, MD  Former Cardiology Providers: Miquel Dunn, MSN, APRN, FNP-C Primary Cardiologist: Rex Kras, DO (established care 01/20/2020)  Date: 04/09/22 Last Office Visit: 03/13/2022  Chief Complaint  Patient presents with   Nonischemic Cardiomyopathy   Follow-up    4 weeks    HPI  Jordan Willis is a 71 y.o.  female whose past medical history and cardiovascular risk factors include: Left bundle branch block, type 2 diabetes mellitus, hyperlipidemia, nonischemic cardiomyopathy, chronic combined systolic/diastolic heart failure, recurrent breast cancer status post bilateral mastectomies (currently on chemotherapy), postmenopausal female, advanced age.  Patient is accompanied by her daughter at today's office visit who provides collateral history and patient provides verbal consent with having them present during today's encounter.  In June 2022 her LVEF was 50-55%-reoccurrence of breast cancer and initiating chemotherapy her LVEF has been trending down and most recent LVEF reported as 30 to 35% with global and regional wall motion abnormalities.  The regional wall motion abnormalities could be secondary to underlying left bundle branch block or CAD.  She was scheduled for left heart catheterization and was noted to have normal epicardial coronary artery disease and LVEF consistent with nonischemic cardiomyopathy.  She now presents for follow-up.  No post left heart catheterization complications.  Denies anginal chest pain and her heart failure symptoms are well controlled on current therapy.  FUNCTIONAL STATUS: No structured exercise program or daily routine.  ALLERGIES: Allergies  Allergen Reactions   Bactrim [Sulfamethoxazole-Trimethoprim] Shortness Of Breath    Shortness of breath, difficulty breathing, headache, rash, fatigue   Aspirin Palpitations and Other (See Comments)    Heart  fluttering, pt takes 40.5 mg (half of 81 mg) daily   Sulfa Antibiotics Hives   Tape Rash    Tears skin off   MEDICATION LIST PRIOR TO VISIT: Current Outpatient Medications on File Prior to Visit  Medication Sig Dispense Refill   Ascorbic Acid (VITAMIN C PO) Take 500 mg by mouth daily.     atorvastatin (LIPITOR) 20 MG tablet TAKE 1 TABLET BY MOUTH EVERYDAY AT BEDTIME 90 tablet 1   b complex vitamins capsule Take 1 capsule by mouth daily.     bumetanide (BUMEX) 1 MG tablet TAKE 1 TABLET BY MOUTH EVERY DAY 30 tablet 1   cetirizine (ZYRTEC) 10 MG tablet Take 10 mg by mouth in the morning.     Cholecalciferol (VITAMIN D3) 25 MCG (1000 UT) CAPS Take 1,000 Units by mouth in the morning.     FARXIGA 10 MG TABS tablet TAKE 1 TABLET BY MOUTH DAILY BEFORE BREAKFAST. (Patient taking differently: Take 10 mg by mouth daily.) 90 tablet 0   fluticasone (FLONASE) 50 MCG/ACT nasal spray Place 1 spray into both nostrils in the morning.     GLUCOSAMINE-CHONDROITIN PO Take 1 tablet by mouth every evening.      glucose blood (ONETOUCH VERIO) test strip TEST ONCE DAILY AS DIRECTED 90     ibuprofen (ADVIL) 200 MG tablet Take 400 mg by mouth every 6 (six) hours as needed (for pain.).     metFORMIN (GLUCOPHAGE-XR) 500 MG 24 hr tablet Take 500 mg by mouth 2 (two) times daily.     metoprolol succinate (TOPROL-XL) 25 MG 24 hr tablet TAKE 1 TABLET (25 MG TOTAL) BY MOUTH IN THE MORNING. HOLD IF SYSTOLIC BLOOD PRESSURE (TOP BLOOD PRESSURE NUMBER) LESS THAN 100 MMHG OR HEART RATE LESS THAN 60 BPM (PULSE). (Patient taking  differently: Take 25 mg by mouth daily. Hold if systolic blood pressure (top blood pressure number) is less than 100 MMHG or heart rate less than 60 BPM (pulse)) 90 tablet 3   Multiple Vitamin (MULTIVITAMIN WITH MINERALS) TABS tablet Take 1 tablet by mouth daily. Centrum Silver for Women 50+     Olopatadine HCl 0.2 % SOLN Place 1 drop into both eyes daily as needed (allergies and itching eyes). Pataday      ondansetron (ZOFRAN) 4 MG tablet Take 1 tablet (4 mg total) by mouth every 8 (eight) hours as needed for nausea or vomiting. 20 tablet 0   OneTouch Delica Lancets 80X MISC Apply topically.     oxyCODONE (ROXICODONE) 5 MG immediate release tablet Take 1 tablet (5 mg total) by mouth every 8 (eight) hours as needed. (Patient taking differently: Take 5 mg by mouth every 8 (eight) hours as needed for severe pain or moderate pain.) 20 tablet 0   Probiotic Product (PROBIOTIC DAILY PO) Take 1 capsule by mouth in the morning.     sacubitril-valsartan (ENTRESTO) 49-51 MG Take 1 tablet by mouth 2 (two) times daily. 60 tablet 0   sertraline (ZOLOFT) 50 MG tablet Take 50 mg by mouth in the morning.     solifenacin (VESICARE) 10 MG tablet Take 10 mg by mouth in the morning.     spironolactone (ALDACTONE) 25 MG tablet Take 1 tablet (25 mg total) by mouth every morning. 90 tablet 0   traMADol (ULTRAM) 50 MG tablet Take by mouth every 6 (six) hours as needed.     traZODone (DESYREL) 50 MG tablet Take 50 mg by mouth at bedtime as needed for sleep.     No current facility-administered medications on file prior to visit.    PAST MEDICAL HISTORY: Past Medical History:  Diagnosis Date   Anemia    Anginal pain (HCC)    Anxiety    Arthritis    Lumbar spine DDD.   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   Diabetes mellitus without complication (HCC)    Type II   Dyspnea    When patient gets sick uses Pro-Air inhaler   Frequent sinus infections    GERD (gastroesophageal reflux disease)    occ   Goiter    History of cardiomegaly    History of cataract    Bilateral   History of kidney stones    08/28/2018 currently has a large kidney stone, asymptomatic at this time   History of migraine    History of uterine fibroid     History of uterine prolapse    Hyperlipidemia    Hypertension    LBBB (left bundle branch block)    Menopause 03/03/2012   Nasal turbinate hypertrophy 11/29/2016   Left inferior    Personal history of chemotherapy    Personal history of radiation therapy    Pneumonia    as a child   PONV (postoperative nausea and vomiting)    Sinusitis 2014   Sleep apnea    No CPAP   SUI (stress urinary incontinence, female)    SVD (spontaneous vaginal delivery)    x 2   Thyroid nodule     PAST SURGICAL HISTORY: Past Surgical History:  Procedure Laterality Date   ABDOMINAL HYSTERECTOMY  BILATERAL SALPINGOOPHORECTOMY Bilateral    BLADDER SUSPENSION N/A 05/16/2017   Procedure: TRANSVAGINAL TAPE (TVT) PROCEDURE;  Surgeon: Everett Graff, MD;  Location: [facility];  Service: Gynecology;  Laterality: N/A;   BREAST BIOPSY Right 2009   BREAST IMPLANT REMOVAL Bilateral 02/05/2022   Procedure: Removal bilateral breast implants;  Surgeon: [name], MD;  Location: [facility];  Service: Plastics;  Laterality: Bilateral;   BREAST LUMPECTOMY Right 2009   BREAST LUMPECTOMY Left 2006   BREAST RECONSTRUCTION WITH PLACEMENT OF TISSUE EXPANDER AND FLEX HD (ACELLULAR HYDRATED DERMIS) Bilateral 09/04/2021   Procedure: BILATERAL BREAST RECONSTRUCTION WITH PLACEMENT OF TISSUE EXPANDER AND FLEX HD (ACELLULAR HYDRATED DERMIS);  Surgeon: [name], MD;  Location: [facility];  Service: Plastics;  Laterality: Bilateral;   CARDIAC CATHETERIZATION     CATARACT EXTRACTION Left 10/23/2017   CATARACT EXTRACTION Right 11/06/2017   COLONOSCOPY  10/22/2009   Normal.  Repeat 5 years.  [name]   CYSTOSCOPY N/A 05/16/2017   Procedure: CYSTOSCOPY;  Surgeon: Everett Graff, MD;  Location: [facility];  Service: Gynecology;  Laterality: N/A;   DEBRIDEMENT AND CLOSURE WOUND Bilateral 10/03/2021   Procedure: Debridement bilateral mastectomy flaps;  Surgeon: [name], MD;  Location: [facility];  Service: Plastics;  Laterality:  Bilateral;  1.5 hour   EYE SURGERY     bilateral cataracts   LEFT HEART CATH AND CORONARY ANGIOGRAPHY N/A 02/09/2020   Procedure: LEFT HEART CATH AND CORONARY ANGIOGRAPHY;  Surgeon: Nigel Mormon, MD;  Location: [facility];  Service: Cardiovascular;  Laterality: N/A;   LEFT HEART CATH AND CORONARY ANGIOGRAPHY N/A 03/27/2022   Procedure: LEFT HEART CATH AND CORONARY ANGIOGRAPHY;  Surgeon: Adrian Prows, MD;  Location: [facility];  Service: Cardiovascular;  Laterality: N/A;   MASTECTOMY W/ SENTINEL NODE BIOPSY Right 09/04/2021   Procedure: BILATERAL MASTECTOMIES WITH RIGHT SENTINEL LYMPH NODE BIOPSY;  Surgeon: Stark Klein, MD;  Location: [place];  Service: General;  Laterality: Right;   REMOVAL OF BILATERAL TISSUE EXPANDERS WITH PLACEMENT OF BILATERAL BREAST IMPLANTS Bilateral 10/03/2021   Procedure: REMOVAL OF BILATERAL TISSUE EXPANDERS WITH REPLACEMENT OF TISSUE EXPANDERS;  Surgeon: [name], MD;  Location: [place];  Service: Plastics;  Laterality: Bilateral;   REMOVAL OF BILATERAL TISSUE EXPANDERS WITH PLACEMENT OF BILATERAL BREAST IMPLANTS Bilateral 12/19/2021   Procedure: REMOVAL OF BILATERAL TISSUE EXPANDERS WITH PLACEMENT OF BILATERAL BREAST IMPLANTS;  Surgeon: [name], MD;  Location: [facility];  Service: Plastics;  Laterality: Bilateral;   ROBOTIC ASSISTED TOTAL HYSTERECTOMY N/A 05/16/2017   Procedure: ROBOTIC ASSISTED TOTAL HYSTERECTOMY;  Surgeon: Delsa Bern, MD;  Location: [facility];  Service: Gynecology;  Laterality: N/A;   ROTATOR CUFF REPAIR Bilateral    SHOULDER ARTHROSCOPY WITH ROTATOR CUFF REPAIR AND SUBACROMIAL DECOMPRESSION Right 09/03/2018   Procedure: Right shoulder manipulation under anesthesia, exam under anesthesia, mini open rotator cuff repair, subacromial decompression;  Surgeon: Susa Day, MD;  Location: [facility];  Service: Orthopedics;  Laterality: Right;  90 mins   TUBAL LIGATION     WISDOM TOOTH EXTRACTION      FAMILY HISTORY: The  patient's family history includes Breast cancer in her sister and sister; Cancer in her sister and sister; Cancer (age of onset: 28) in her mother; Diabetes in her father; Hypertension in her mother.   SOCIAL HISTORY:  The patient  reports that she has never smoked. She has never used smokeless tobacco. She reports that she does not drink alcohol and does not use  drugs.  Review of Systems  Cardiovascular:  Negative for chest pain, dyspnea on exertion, leg swelling, orthopnea, palpitations, paroxysmal nocturnal dyspnea and syncope.  Respiratory:  Negative for cough and shortness of breath.     PHYSICAL EXAM:    04/09/2022   11:44 AM 03/28/2022    8:25 AM 03/27/2022   11:40 AM  Vitals with BMI  Height 5' 7"  5' 7.5"   Weight 185 lbs 6 oz 187 lbs 14 oz   BMI 36.46 80.32   Systolic 122 482 500  Diastolic 75 63 52  Pulse 91 76 72    CONSTITUTIONAL: Well-developed and well-nourished. No acute distress.  SKIN: Skin is warm and dry. No rash noted. No cyanosis. No pallor. No jaundice HEAD: Normocephalic and atraumatic.  EYES: No scleral icterus MOUTH/THROAT: Moist oral membranes.  NECK: No JVD present. No thyromegaly noted. No carotid bruits  CHEST Normal respiratory effort. No intercostal retractions.  Bilateral mastectomies. LUNGS: Clear to auscultation bilaterally.  No stridor. No wheezes. No rales.  CARDIOVASCULAR: Regular rate and rhythm, positive S1-S2, no murmurs rubs or gallops appreciated. ABDOMINAL: Soft, nontender, nondistended, positive bowel sounds in all 4 quadrants, no apparent ascites.  EXTREMITIES: No peripheral edema, warm to touch HEMATOLOGIC: No significant bruising NEUROLOGIC: Oriented to person, place, and time. Nonfocal. Normal muscle tone.  PSYCHIATRIC: Normal mood and affect. Normal behavior. Cooperative No significant change in physical examination since last office encounter.  CARDIAC DATABASE: EKG: 03/13/2022: Normal sinus rhythm, 64 bpm, LAE,  LBBB.  Echocardiogram: 03/29/2021:  50-55%, G1DD, moderate MR, Moderate TR, PASP 59mHG.   06/15/2021: LVEF 30-35%, severe hypokinesis of the anteroseptal and apex, moderate to severe LV dysfunction, grade 1 diastolic dysfunction, elevated LAP, global longitudinal strain -16.7%, mild to moderate Mr.   10/09/2021: 45-50%, no regional wall motion abnormalities, global longitudinal strain -13.2%, mild MR.   01/29/2022: 35-40%, average GLS -137.0% grade 1 diastolic impairment, severe hypokinesis of the left ventricle with regional wall motion abnormalities, moderately dilated left atrium, posterior pericardial effusion without tamponade, moderate to severe MR, estimated RAP 8 mmHg.  03/09/2022: LVEF 30-35%, average GLS -15.4%, mild MR, RAP 3 mmHg.  Stress Testing:  02/14/2018 exercise nuclear stress test at [facility] Achieved 7 METS, normal blood pressure response to exercise, had exertional chest pain that improved/resolved during recovery, normal myocardial perfusion study no EKG abnormalities per report.  Heart Catheterization: Left Heart Catheterization 03/27/22:  LV: 118/3, EDP 11 mmHg.  Ao 128/56, mean 84 mmHg.  No pressure gradient across the aortic valve. LVEF 30 to 35% with global hypokinesis.  LV is mildly dilated. LM: Large caliber vessels, smooth and normal. LAD: Large vessel, gives origin to several small diagonals.  Smooth and normal.  Mid to distal segment is tortuous and may suggest hypertensive heart disease. RI: Large vessel, gives origin to large lateral branches.  Smooth and normal. CX: Large vessel, gives origin to moderate-sized OM1, 2 and continues as OM 3.  Smooth and normal. RCA: Dominant, continues as a PDA.  Smooth and normal.  Impression: Findings consistent with nonischemic dilated cardiomyopathy.  Severe LV systolic dysfunction.   LABORATORY DATA:    Latest Ref Rng & Units 03/28/2022    7:58 AM 03/27/2022    7:32 AM 02/05/2022    2:14 PM  CBC  WBC 4.0  - 10.5 K/uL 3.8  4.7    Hemoglobin 12.0 - 15.0 g/dL 12.5  12.5  12.6   Hematocrit 36.0 - 46.0 % 39.0  39.5  37.0  Platelets 150 - 400 K/uL 285  296         Latest Ref Rng & Units 03/28/2022    7:58 AM 03/27/2022    7:32 AM 03/06/2022    8:07 AM  CMP  Glucose 70 - 99 mg/dL 115  120  101   BUN 8 - 23 mg/dL 10  9  11    Creatinine 0.44 - 1.00 mg/dL 0.62  0.65  0.73   Sodium 135 - 145 mmol/L 138  140  139   Potassium 3.5 - 5.1 mmol/L 3.7  3.8  4.7   Chloride 98 - 111 mmol/L 106  107  101   CO2 22 - 32 mmol/L 24  24  24    Calcium 8.9 - 10.3 mg/dL 9.8  9.8  10.1   Total Protein 6.5 - 8.1 g/dL 7.9     Total Bilirubin 0.3 - 1.2 mg/dL 0.4     Alkaline Phos 38 - 126 U/L 89     AST 15 - 41 U/L 15     ALT 0 - 44 U/L 11       Lipid Panel: Outside records 06/24/2018: Total cholesterol 176, triglycerides 104, HDL 52, LDL 103  Lipid Panel  Lab Results  Component Value Date   CHOL 158 10/13/2020   HDL 44 10/13/2020   LDLCALC 94 10/13/2020   TRIG 107 10/13/2020   CHOLHDL 6.2 10/11/2013    Lab Results  Component Value Date   HGBA1C 7.2 (H) 08/01/2021   HGBA1C 6.1 (H) 08/28/2018   HGBA1C 6.8 (H) 10/11/2013   No components found for: "NTPROBNP" Lab Results  Component Value Date   TSH 0.599 10/11/2013   TSH 0.401 07/02/2012    Cardiac Panel (last 3 results) No results for input(s): "CKTOTAL", "CKMB", "TROPONINIHS", "RELINDX" in the last 72 hours.  External Labs: Collected: 09/26/2021 available in Care Everywhere. Sodium 147, potassium 4.6, chloride 107, bicarb 26, BUN 9, creatinine 0.62 AST 25, ALT 39, alkaline phosphatase 88 EGFR 106 Hemoglobin A1c 7.1 TSH 0.52  Collected 02/16/2021: Total cholesterol 182, triglycerides 118, HDL 44, LDL 114, non-HDL 117   FINAL MEDICATION LIST END OF ENCOUNTER: No orders of the defined types were placed in this encounter.   Current Outpatient Medications:    Ascorbic Acid (VITAMIN C PO), Take 500 mg by mouth daily., Disp: , Rfl:     atorvastatin (LIPITOR) 20 MG tablet, TAKE 1 TABLET BY MOUTH EVERYDAY AT BEDTIME, Disp: 90 tablet, Rfl: 1   b complex vitamins capsule, Take 1 capsule by mouth daily., Disp: , Rfl:    bumetanide (BUMEX) 1 MG tablet, TAKE 1 TABLET BY MOUTH EVERY DAY, Disp: 30 tablet, Rfl: 1   cetirizine (ZYRTEC) 10 MG tablet, Take 10 mg by mouth in the morning., Disp: , Rfl:    Cholecalciferol (VITAMIN D3) 25 MCG (1000 UT) CAPS, Take 1,000 Units by mouth in the morning., Disp: , Rfl:    FARXIGA 10 MG TABS tablet, TAKE 1 TABLET BY MOUTH DAILY BEFORE BREAKFAST. (Patient taking differently: Take 10 mg by mouth daily.), Disp: 90 tablet, Rfl: 0   fluticasone (FLONASE) 50 MCG/ACT nasal spray, Place 1 spray into both nostrils in the morning., Disp: , Rfl:    GLUCOSAMINE-CHONDROITIN PO, Take 1 tablet by mouth every evening. , Disp: , Rfl:    glucose blood (ONETOUCH VERIO) test strip, TEST ONCE DAILY AS DIRECTED 90, Disp: , Rfl:    ibuprofen (ADVIL) 200 MG tablet, Take 400 mg by mouth every 6 (six)  hours as needed (for pain.)., Disp: , Rfl:    metFORMIN (GLUCOPHAGE-XR) 500 MG 24 hr tablet, Take 500 mg by mouth 2 (two) times daily., Disp: , Rfl:    metoprolol succinate (TOPROL-XL) 25 MG 24 hr tablet, TAKE 1 TABLET (25 MG TOTAL) BY MOUTH IN THE MORNING. HOLD IF SYSTOLIC BLOOD PRESSURE (TOP BLOOD PRESSURE NUMBER) LESS THAN 100 MMHG OR HEART RATE LESS THAN 60 BPM (PULSE). (Patient taking differently: Take 25 mg by mouth daily. Hold if systolic blood pressure (top blood pressure number) is less than 100 MMHG or heart rate less than 60 BPM (pulse)), Disp: 90 tablet, Rfl: 3   Multiple Vitamin (MULTIVITAMIN WITH MINERALS) TABS tablet, Take 1 tablet by mouth daily. Centrum Silver for Women 50+, Disp: , Rfl:    Olopatadine HCl 0.2 % SOLN, Place 1 drop into both eyes daily as needed (allergies and itching eyes). Pataday, Disp: , Rfl:    ondansetron (ZOFRAN) 4 MG tablet, Take 1 tablet (4 mg total) by mouth every 8 (eight) hours as needed for  nausea or vomiting., Disp: 20 tablet, Rfl: 0   OneTouch Delica Lancets 75I MISC, Apply topically., Disp: , Rfl:    oxyCODONE (ROXICODONE) 5 MG immediate release tablet, Take 1 tablet (5 mg total) by mouth every 8 (eight) hours as needed. (Patient taking differently: Take 5 mg by mouth every 8 (eight) hours as needed for severe pain or moderate pain.), Disp: 20 tablet, Rfl: 0   Probiotic Product (PROBIOTIC DAILY PO), Take 1 capsule by mouth in the morning., Disp: , Rfl:    sacubitril-valsartan (ENTRESTO) 49-51 MG, Take 1 tablet by mouth 2 (two) times daily., Disp: 60 tablet, Rfl: 0   sertraline (ZOLOFT) 50 MG tablet, Take 50 mg by mouth in the morning., Disp: , Rfl:    solifenacin (VESICARE) 10 MG tablet, Take 10 mg by mouth in the morning., Disp: , Rfl:    spironolactone (ALDACTONE) 25 MG tablet, Take 1 tablet (25 mg total) by mouth every morning., Disp: 90 tablet, Rfl: 0   traMADol (ULTRAM) 50 MG tablet, Take by mouth every 6 (six) hours as needed., Disp: , Rfl:    traZODone (DESYREL) 50 MG tablet, Take 50 mg by mouth at bedtime as needed for sleep., Disp: , Rfl:   IMPRESSION:    ICD-10-CM   1. NICM (nonischemic cardiomyopathy) ([place])  I42.8 MUGA SCAN    2. LBBB (left bundle branch block)  I44.7 MUGA SCAN    3. Chronic combined systolic and diastolic heart failure (HCC)  I50.42 MUGA SCAN    4. Essential hypertension  I10     5. Non-insulin dependent type 2 diabetes mellitus ([place])  E11.9     6. Mixed hyperlipidemia  E78.2     7. Malignant neoplasm of upper-outer quadrant of left breast in female, estrogen receptor negative ([place])  C50.412 MUGA SCAN   Z17.1        RECOMMENDATIONS: Jordan Willis is a 71 y.o. female whose past medical history and cardiovascular risk factors include: Left bundle branch block, type 2 diabetes mellitus, hyperlipidemia, nonischemic cardiomyopathy, chronic combined systolic/diastolic heart failure, recurrent breast cancer status post bilateral mastectomies  (currently on chemotherapy), postmenopausal female, advanced age.  NICM (nonischemic cardiomyopathy) ([place]) / Chronic combined systolic and diastolic heart failure (HCC) /Cardiomyopathy, unspecified type (HCC) Stage B, NYHA class II. She remains euvolemic on current medical therapy. Status post left heart catheterization which notes normal epicardial coronary arteries.  Her LVEF which was reduced since June 2022  may be secondary to component of both chemotherapy induced cardiomyopathy and also the mechanical dyssynchrony given her underlying left bundle branch block. I would like to reevaluate the LVEF from a different modality we discussed cardiac MRI versus MUGA scan.   The shared decision was to proceed with MUGA scan. If the MUGA scan read illustrates severely reduced LVEF we will consider discussing with the patient and family cardiac electrophysiology consultation for possible resynchronization therapy in the clinical setting. Further recommendations to follow. Has an appt w/ her oncologist in July 2023.   Essential hypertension Office and home blood pressures are well controlled. Medications reconciled. No changes warranted.  Non-insulin dependent type 2 diabetes mellitus ([place]) Most recent hemoglobin A1c reviewed. Educated importance of glycemic control. Continue Entresto, statin therapy, Farxiga.  Mixed hyperlipidemia Currently on atorvastatin. Denies myalgias or other side effects. Most recent lipid profile from May 2023 independently reviewed, available in [place]. Will discuss uptitration of statins at the next visit.   Malignant neoplasm of upper-outer quadrant of left breast in female, estrogen receptor negative ([place]) Currently being followed by oncology.  --Continue cardiac medications as reconciled in final medication list. --Return in about 4 months (around 08/09/2022) for Follow up cardiomyopathy and congestive heart failure. Or sooner if needed. --Continue  follow-up with your primary care physician regarding the management of your other chronic comorbid conditions.  Patient's questions and concerns were addressed to her satisfaction. She voices understanding of the instructions provided during this encounter.   This note was created using a voice recognition software as a result there may be grammatical errors inadvertently enclosed that do not reflect the nature of this encounter. Every attempt is made to correct such errors.   Rex Kras, [place], [facility]  Pager: [phone] Office: [phone]

## 2022-04-12 ENCOUNTER — Ambulatory Visit: Payer: Medicaid Other | Admitting: Cardiology

## 2022-04-18 ENCOUNTER — Ambulatory Visit (INDEPENDENT_AMBULATORY_CARE_PROVIDER_SITE_OTHER): Payer: Medicare Other | Admitting: Plastic Surgery

## 2022-04-18 DIAGNOSIS — Z9889 Other specified postprocedural states: Secondary | ICD-10-CM

## 2022-04-18 NOTE — Addendum Note (Signed)
Addended by: Oran Rein on: 04/18/2022 03:59 PM   Modules accepted: Orders

## 2022-04-18 NOTE — Progress Notes (Signed)
Patient presents postop from removal of failed implant breast reconstruction.  We just could not get her to heal.  She has a history of radiation therapy bilaterally.  She feels like things are coming along.  She has had less drainage on the left side.  She does have some intermittent itching on the right side.  On exam she looks to be well-healed on the right.  There is still a small pinpoint wound laterally on the left side of the incision.  No drainage or signs of infection.  Overall I think she is making slow progress.  We will plan to see her again in 6 weeks to check in.  All of her questions were answered.

## 2022-04-26 NOTE — Progress Notes (Signed)
Patient Care Team: [name], MD as PCP - General (Internal Medicine) Delsa Bern, MD as Attending Physician (Obstetrics and Gynecology) Posey Boyer, MD (Inactive) as Consulting Physician (Family Medicine) Rockwell Germany, RN as Oncology Nurse Navigator Mauro Kaufmann, RN as Oncology Nurse Navigator  DIAGNOSIS:  Encounter Diagnosis  Name Primary?   Malignant neoplasm of upper-outer quadrant of left breast in female, estrogen receptor negative ([place]) Yes    SUMMARY OF ONCOLOGIC HISTORY: Oncology History  Malignant neoplasm of upper-outer quadrant of left breast in female, estrogen receptor negative ([place])  02/02/2005 Initial Diagnosis   Left breast needle core biopsy showed invasive mammary cancer   02/11/2005 Breast MRI   Left breast upper outer quadrant: 4.4 x 2.6 x 3.7 cm mass and a smaller adjacent mass measuring 1 x 0.8 x 0.4 cm   03/02/2005 - 07/20/2005 Neo-Adjuvant Chemotherapy   AC4 followed by Taxotere 4   08/03/2005 Surgery   Left breast lumpectomy: 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%, PR 0%, HER-2 negative, Ki-67 31%   09/26/2005 - 11/13/2005 Radiation Therapy   Adjuvant radiation therapy   03/18/2008 Initial Biopsy   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   03/22/2008 Surgery   Right breast lumpectomy 0.9 cm DCIS ER 90% PR 98% stage 0   04/11/2008 Procedure   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   05/10/2008 - 07/02/2008 Radiation Therapy   Adjuvant radiation therapy   07/17/2008 - 04/16/2009 Anti-estrogen oral therapy   Femara then switched to tamoxifen which was discontinued in 2010 due to concern about uterine cancer   05/23/2021 Relapse/Recurrence   Screening detected right breast mass at 10 o'clock position measuring 1.2 cm, ultrasound-guided biopsy revealed grade 3 IDC ER 20%, PR 0%, HER2 positive 3+, Ki-67 50%   05/31/2021 Cancer  Staging   Staging form: Breast, AJCC 7th Edition - Clinical: Stage IA (rT1c, N0, M0) - Signed by Nicholas Lose, MD on 05/31/2021 Stage prefix: Recurrence Laterality: Right Biopsy of metastatic site performed: No Estrogen receptor status: Positive Progesterone receptor status: Negative HER2 status: Positive   09/04/2021 Surgery   Bilateral mastectomies Left mastectomy: Benign Right mastectomy: Grade 3 IDC with DCIS 3.6 cm, 0/6 lymph nodes negative, ER 20%, PR 0%, HER2 positive by IHC, Ki-67 50%   09/12/2021 Cancer Staging   Staging form: Breast, AJCC 7th Edition - Pathologic: Stage IIA (T2, N0, cM0) - Signed by Nicholas Lose, MD on 09/12/2021 Estrogen receptor status: Positive Progesterone receptor status: Positive HER2 status: Negative   Breast cancer of upper-outer quadrant of right female breast ([place])  09/04/2021 Initial Diagnosis   Breast cancer of upper-outer quadrant of right female breast ([place])   11/21/2021 -  Chemotherapy   Patient is on Treatment Plan : BREAST Trastuzumab q21d       CHIEF COMPLIANT: Follow-up on Trastuzumab to discuss Letrozole  INTERVAL HISTORY: Jordan Willis is a 71- year-old patient with the above mentioned   history of right breast cancer having undergone bilateral mastectomies, currently on chemotherapy with Trastzumab. She presents to the clinic today for follow-up to discuss Letrozole. She states that her heart is better. She denies leg pain. She is having pain starting from the back of her head to the front. She did take 3 ibuprofen for relief. The pain is not on the skin but the pain is inside of the head. She is also still having pain in  the breast.   ALLERGIES:  is allergic to bactrim [sulfamethoxazole-trimethoprim], aspirin, sulfa antibiotics, and tape.  MEDICATIONS:  Current Outpatient Medications  Medication Sig Dispense Refill   Ascorbic Acid (VITAMIN C PO) Take 500 mg by mouth daily.     atorvastatin (LIPITOR) 20 MG tablet TAKE 1 TABLET  BY MOUTH EVERYDAY AT BEDTIME 90 tablet 1   b complex vitamins capsule Take 1 capsule by mouth daily.     bumetanide (BUMEX) 1 MG tablet TAKE 1 TABLET BY MOUTH EVERY DAY 30 tablet 1   cetirizine (ZYRTEC) 10 MG tablet Take 10 mg by mouth in the morning.     Cholecalciferol (VITAMIN D3) 25 MCG (1000 UT) CAPS Take 1,000 Units by mouth in the morning.     FARXIGA 10 MG TABS tablet TAKE 1 TABLET BY MOUTH DAILY BEFORE BREAKFAST. (Patient taking differently: Take 10 mg by mouth daily.) 90 tablet 0   fluticasone (FLONASE) 50 MCG/ACT nasal spray Place 1 spray into both nostrils in the morning.     GLUCOSAMINE-CHONDROITIN PO Take 1 tablet by mouth every evening.      glucose blood (ONETOUCH VERIO) test strip TEST ONCE DAILY AS DIRECTED 90     ibuprofen (ADVIL) 200 MG tablet Take 400 mg by mouth every 6 (six) hours as needed (for pain.).     metFORMIN (GLUCOPHAGE-XR) 500 MG 24 hr tablet Take 500 mg by mouth 2 (two) times daily.     metoprolol succinate (TOPROL-XL) 25 MG 24 hr tablet TAKE 1 TABLET (25 MG TOTAL) BY MOUTH IN THE MORNING. HOLD IF SYSTOLIC BLOOD PRESSURE (TOP BLOOD PRESSURE NUMBER) LESS THAN 100 MMHG OR HEART RATE LESS THAN 60 BPM (PULSE). (Patient taking differently: Take 25 mg by mouth daily. Hold if systolic blood pressure (top blood pressure number) is less than 100 MMHG or heart rate less than 60 BPM (pulse)) 90 tablet 3   Multiple Vitamin (MULTIVITAMIN WITH MINERALS) TABS tablet Take 1 tablet by mouth daily. Centrum Silver for Women 50+     Olopatadine HCl 0.2 % SOLN Place 1 drop into both eyes daily as needed (allergies and itching eyes). Pataday     ondansetron (ZOFRAN) 4 MG tablet Take 1 tablet (4 mg total) by mouth every 8 (eight) hours as needed for nausea or vomiting. 20 tablet 0   OneTouch Delica Lancets 55H MISC Apply topically.     oxyCODONE (ROXICODONE) 5 MG immediate release tablet Take 1 tablet (5 mg total) by mouth every 8 (eight) hours as needed. (Patient taking differently: Take  5 mg by mouth every 8 (eight) hours as needed for severe pain or moderate pain.) 20 tablet 0   Probiotic Product (PROBIOTIC DAILY PO) Take 1 capsule by mouth in the morning.     sacubitril-valsartan (ENTRESTO) 49-51 MG Take 1 tablet by mouth 2 (two) times daily. 60 tablet 0   sertraline (ZOLOFT) 50 MG tablet Take 50 mg by mouth in the morning.     solifenacin (VESICARE) 10 MG tablet Take 10 mg by mouth in the morning.     spironolactone (ALDACTONE) 25 MG tablet Take 1 tablet (25 mg total) by mouth every morning. 90 tablet 0   traMADol (ULTRAM) 50 MG tablet Take by mouth every 6 (six) hours as needed.     traZODone (DESYREL) 50 MG tablet Take 50 mg by mouth at bedtime as needed for sleep.     No current facility-administered medications for this visit.    PHYSICAL EXAMINATION: ECOG PERFORMANCE STATUS: 1 -  Symptomatic but completely ambulatory  Vitals:   04/27/22 0826  BP: 123/66  Pulse: 73  Resp: 16  Temp: 97.8 F (36.6 C)  SpO2: 95%   Filed Weights   04/27/22 0826  Weight: 183 lb 8 oz (83.2 kg)      LABORATORY DATA:  I have reviewed the data as listed    Latest Ref Rng & Units 03/28/2022    7:58 AM 03/27/2022    7:32 AM 03/06/2022    8:07 AM  CMP  Glucose 70 - 99 mg/dL 115  120  101   BUN 8 - 23 mg/dL 10  9  11    Creatinine 0.44 - 1.00 mg/dL 0.62  0.65  0.73   Sodium 135 - 145 mmol/L 138  140  139   Potassium 3.5 - 5.1 mmol/L 3.7  3.8  4.7   Chloride 98 - 111 mmol/L 106  107  101   CO2 22 - 32 mmol/L 24  24  24    Calcium 8.9 - 10.3 mg/dL 9.8  9.8  10.1   Total Protein 6.5 - 8.1 g/dL 7.9     Total Bilirubin 0.3 - 1.2 mg/dL 0.4     Alkaline Phos 38 - 126 U/L 89     AST 15 - 41 U/L 15     ALT 0 - 44 U/L 11       Lab Results  Component Value Date   WBC 3.8 (L) 03/28/2022   HGB 12.5 03/28/2022   HCT 39.0 03/28/2022   MCV 87.1 03/28/2022   PLT 285 03/28/2022   NEUTROABS 1.9 03/28/2022    ASSESSMENT & PLAN:  Malignant neoplasm of upper-outer quadrant of left breast  in female, estrogen receptor negative ([place]) 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% status post lumpectomy radiation and 5 years of tamoxifen completed 04/16/2009 Recurrence: 05/23/2021: Screening mammogram detected right breast mass 10 o'clock position 1.2 cm, biopsy with grade 3 IDC ER 20%, PR 0%, HER2 positive, Ki-67 5%) Bilateral mastectomies: Right mastectomy: Grade 3 IDC with DCIS 3.6 cm, 0/6 lymph nodes negative, ER 20%, PR 20%, HER2 positive by IHC, Ki-67 50% Received 4 cycles of Herceptin stopped because of decline in ejection fraction ---------------------------------------------------------------------------------------------------------------------------------------------------- Non-ischemic cardiomyopathy: NYHA clas 2: follows with cardiology. Heart cath: Normal. MUGA planned   Headaches: Unclear etiology we will need a stat brain MRI for further evaluation.  With her history of recurrent breast cancer I am concerned for brain metastases.  Recommendation: I recommended the patient's start anastrozole therapy she will think about it and let us know of her decision.  Assuming that the brain MRI is negative and she can be seen in 3 months for survivorship appointment.     Orders Placed This Encounter  Procedures   MR Brain W Wo Contrast    Standing Status:   Future    Standing Expiration Date:   04/27/2023    Order Specific Question:   If indicated for the ordered procedure, I authorize the administration of contrast media  per Radiology protocol    Answer:  Yes    Order Specific Question:   What is the patient's sedation requirement?    Answer:   No Sedation    Order Specific Question:   Does the patient have a pacemaker or implanted devices?    Answer:   No    Order Specific Question:   Use SRS Protocol?    Answer:   Yes    Order Specific Question:   Preferred imaging location?    Answer:   [facility] (table limit - 550 lbs)   The patient has a good understanding of the overall plan. she agrees with it. she will call with any problems that may develop before the next visit here. Total time spent: 30 mins including face to face time and time spent for planning, charting and co-ordination of care   Harriette Ohara, MD 04/27/22    I Gardiner Coins am scribing for [name]  I have reviewed the above documentation for accuracy and completeness, and I agree with the above.

## 2022-04-27 ENCOUNTER — Encounter: Payer: Self-pay | Admitting: *Deleted

## 2022-04-27 ENCOUNTER — Inpatient Hospital Stay: Payer: Medicare Other | Attending: Hematology and Oncology | Admitting: Hematology and Oncology

## 2022-04-27 ENCOUNTER — Other Ambulatory Visit: Payer: Self-pay

## 2022-04-27 VITALS — BP 123/66 | HR 73 | Temp 97.8°F | Resp 16 | Ht 67.0 in | Wt 183.5 lb

## 2022-04-27 DIAGNOSIS — Z171 Estrogen receptor negative status [ER-]: Secondary | ICD-10-CM | POA: Diagnosis not present

## 2022-04-27 DIAGNOSIS — Z9013 Acquired absence of bilateral breasts and nipples: Secondary | ICD-10-CM | POA: Insufficient documentation

## 2022-04-27 DIAGNOSIS — Z9221 Personal history of antineoplastic chemotherapy: Secondary | ICD-10-CM | POA: Insufficient documentation

## 2022-04-27 DIAGNOSIS — Z923 Personal history of irradiation: Secondary | ICD-10-CM | POA: Diagnosis not present

## 2022-04-27 DIAGNOSIS — C50412 Malignant neoplasm of upper-outer quadrant of left female breast: Secondary | ICD-10-CM | POA: Diagnosis not present

## 2022-04-27 DIAGNOSIS — R519 Headache, unspecified: Secondary | ICD-10-CM | POA: Insufficient documentation

## 2022-04-27 DIAGNOSIS — Z803 Family history of malignant neoplasm of breast: Secondary | ICD-10-CM | POA: Diagnosis not present

## 2022-04-27 NOTE — Assessment & Plan Note (Addendum)
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% status post lumpectomy radiation and 5 years of tamoxifen completed 04/16/2009 Recurrence: 05/23/2021: Screening mammogram detected right breast mass 10 o'clock position 1.2 cm, biopsy with grade 3 IDC ER 20%, PR 0%, HER2 positive, Ki-67 5%) Bilateral mastectomies: Right mastectomy: Grade 3 IDC with DCIS 3.6 cm, 0/6 lymph nodes negative, ER 20%, PR 20%, HER2 positive by IHC, Ki-67 50% Received 4 cycles of Herceptin stopped because of decline in ejection fraction ---------------------------------------------------------------------------------------------------------------------------------------------------- Non-ischemic cardiomyopathy: NYHA clas 2: follows with cardiology. Heart cath: Normal. MUGA planned   Headaches: Unclear etiology we will need a stat brain MRI for further evaluation.  With her history of recurrent breast cancer I am concerned for brain metastases.  Recommendation: I recommended the patient's start anastrozole therapy she will think about it and let us know of her decision.  Assuming that the brain MRI is negative and she can be seen in 3 months for survivorship appointment.

## 2022-04-30 ENCOUNTER — Other Ambulatory Visit: Payer: Self-pay | Admitting: *Deleted

## 2022-04-30 ENCOUNTER — Ambulatory Visit (HOSPITAL_COMMUNITY)
Admission: RE | Admit: 2022-04-30 | Discharge: 2022-04-30 | Disposition: A | Discharge status: Home / Self Care | Payer: Medicare Other | Source: Ambulatory Visit | Attending: Hematology and Oncology | Admitting: Hematology and Oncology

## 2022-04-30 DIAGNOSIS — R519 Headache, unspecified: Secondary | ICD-10-CM | POA: Diagnosis not present

## 2022-04-30 DIAGNOSIS — Z171 Estrogen receptor negative status [ER-]: Secondary | ICD-10-CM | POA: Diagnosis not present

## 2022-04-30 DIAGNOSIS — C50412 Malignant neoplasm of upper-outer quadrant of left female breast: Secondary | ICD-10-CM | POA: Diagnosis not present

## 2022-04-30 MED ORDER — GADOBUTROL 1 MMOL/ML IV SOLN
8.0000 mL | Freq: Once | INTRAVENOUS | Status: AC | PRN
  Administered 2022-04-30: 8 mL via INTRAVENOUS

## 2022-05-01 ENCOUNTER — Other Ambulatory Visit: Payer: Self-pay | Admitting: Cardiology

## 2022-05-02 ENCOUNTER — Telehealth: Payer: Self-pay | Admitting: *Deleted

## 2022-05-02 MED ORDER — ANASTROZOLE 1 MG PO TABS: 1.0000 mg | ORAL_TABLET | Freq: Every day | ORAL | 0 refills | Status: DC

## 2022-05-02 NOTE — Telephone Encounter (Signed)
Received call from pt requesting to proceed with Anastrozole.  MD notified and verbal orders received for pt to start Anastrozole 1 mg p.o every other day x1 week and then every day after.  Scheduling message sent for pt to f/u in 1 month to discuss tolerance.  Pt educated and verbalized understanding.

## 2022-05-02 NOTE — Assessment & Plan Note (Signed)
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% status post lumpectomy radiation and 5 years of tamoxifen completed 04/16/2009 Recurrence: 05/23/2021: Screening mammogram detected right breast mass 10 o'clock position 1.2 cm, biopsy with grade 3 IDC ER 20%, PR 0%, HER2 positive, Ki-67 5%) Bilateral mastectomies: Right mastectomy: Grade 3 IDC with DCIS 3.6 cm, 0/6 lymph nodes negative, ER 20%, PR 20%, HER2 positive by IHC, Ki-67 50% Received 4 cycles of Herceptin stopped because of decline in ejection fraction ---------------------------------------------------------------------------------------------------------------------------------------------------- Non-ischemic cardiomyopathy: NYHA clas 2: follows with cardiology. Heart cath: Normal. MUGA planned   Headaches: brain MRI 04/30/22: No mets, chronic microvascular ischemic changes  Recommendation: I recommended the patient's start anastrozole therapy she will think about it and let us know of her decision.

## 2022-05-03 ENCOUNTER — Telehealth: Payer: Self-pay | Admitting: Hematology and Oncology

## 2022-05-03 ENCOUNTER — Inpatient Hospital Stay (HOSPITAL_BASED_OUTPATIENT_CLINIC_OR_DEPARTMENT_OTHER): Payer: Medicare Other | Admitting: Hematology and Oncology

## 2022-05-03 DIAGNOSIS — Z9013 Acquired absence of bilateral breasts and nipples: Secondary | ICD-10-CM | POA: Diagnosis not present

## 2022-05-03 DIAGNOSIS — Z803 Family history of malignant neoplasm of breast: Secondary | ICD-10-CM | POA: Diagnosis not present

## 2022-05-03 DIAGNOSIS — Z171 Estrogen receptor negative status [ER-]: Secondary | ICD-10-CM | POA: Diagnosis not present

## 2022-05-03 DIAGNOSIS — Z923 Personal history of irradiation: Secondary | ICD-10-CM | POA: Diagnosis not present

## 2022-05-03 DIAGNOSIS — C50412 Malignant neoplasm of upper-outer quadrant of left female breast: Secondary | ICD-10-CM | POA: Diagnosis not present

## 2022-05-03 DIAGNOSIS — R519 Headache, unspecified: Secondary | ICD-10-CM | POA: Diagnosis not present

## 2022-05-03 DIAGNOSIS — Z9221 Personal history of antineoplastic chemotherapy: Secondary | ICD-10-CM | POA: Diagnosis not present

## 2022-05-03 NOTE — Telephone Encounter (Signed)
.  Called patient to schedule appointment per 7/12 inbasket, patient is aware of date and time.   

## 2022-05-03 NOTE — Progress Notes (Signed)
HEMATOLOGY-ONCOLOGY TELEPHONE VISIT PROGRESS NOTE  I connected with our patient on 05/03/22 at 10:45 AM EDT by telephone and verified that I am speaking with the correct person using two identifiers.  I discussed the limitations, risks, security and privacy concerns of performing an evaluation and management service by telephone and the availability of in person appointments.  I also discussed with the patient that there may be a patient responsible charge related to this service. The patient expressed understanding and agreed to proceed.   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%, PR 0%, HER-2 negative, Ki-67 31%   09/26/2005 - 11/13/2005 Radiation Therapy   Adjuvant radiation therapy   03/18/2008 Initial Biopsy   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   03/22/2008 Surgery   Right breast lumpectomy 0.9 cm DCIS ER 90% PR 98% stage 0   04/11/2008 Procedure   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   05/10/2008 - 07/02/2008 Radiation Therapy   Adjuvant radiation therapy   07/17/2008 - 04/16/2009 Anti-estrogen oral therapy   Femara then switched to tamoxifen which was discontinued in 2010 due to concern about uterine cancer   05/23/2021 Relapse/Recurrence   Screening detected right breast mass at 10 o'clock position measuring 1.2 cm, ultrasound-guided biopsy revealed grade 3 IDC ER  20%, PR 0%, HER2 positive 3+, Ki-67 50%   05/31/2021 Cancer Staging   Staging form: Breast, AJCC 7th Edition - Clinical: Stage IA (rT1c, N0, M0) - Signed by Nicholas Lose, MD on 05/31/2021 Stage prefix: Recurrence Laterality: Right Biopsy of metastatic site performed: No Estrogen receptor status: Positive Progesterone receptor status: Negative HER2 status: Positive   09/04/2021 Surgery   Bilateral mastectomies Left mastectomy: Benign Right mastectomy: Grade 3 IDC with DCIS 3.6 cm, 0/6 lymph nodes negative, ER 20%, PR 0%, HER2 positive by IHC, Ki-67 50%   09/12/2021 Cancer Staging   Staging form: Breast, AJCC 7th Edition - Pathologic: Stage IIA (T2, N0, cM0) - Signed by Nicholas Lose, MD on 09/12/2021 Estrogen receptor status: Positive Progesterone receptor status: Positive HER2 status: Negative   Breast cancer of upper-outer quadrant of right female breast ([place])  09/04/2021 Initial Diagnosis   Breast cancer of upper-outer quadrant of right female breast ([place])   11/21/2021 -  Chemotherapy   Patient is on Treatment Plan : BREAST Trastuzumab q21d       REVIEW OF SYSTEMS:   Constitutional: Denies fevers, chills or abnormal weight loss All other systems were reviewed with the patient and are negative. 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 grade 3 status post  neo adjuvant chemotherapy with FEC 4 followed by Taxotere 4 completed 07/20/2005 status post lumpectomy 1.2 cm, grade 3, triple negative, Ki-67 31%, status post radiation completed 11/13/2005 2009: 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%, 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% status post lumpectomy radiation  and 5 years of tamoxifen completed 04/16/2009 Recurrence: 05/23/2021: Screening mammogram detected right breast mass 10 o'clock position 1.2 cm, biopsy with grade 3 IDC ER 20%, PR 0%, HER2 positive, Ki-67 5%) Bilateral mastectomies: Right mastectomy: Grade 3 IDC with DCIS 3.6 cm, 0/6 lymph nodes negative, ER 20%, PR 20%, HER2 positive by IHC, Ki-67 50% Received 4 cycles of Herceptin stopped because of decline in ejection fraction ---------------------------------------------------------------------------------------------------------------------------------------------------- Non-ischemic cardiomyopathy: NYHA clas 2: follows with cardiology. Heart cath: Normal. MUGA planned  Headaches: brain MRI 04/30/22: No mets, chronic microvascular ischemic changes   Recommendation: Anastrozole started 05/03/22 Telephone visit in 1 month   I discussed the assessment and treatment plan with the patient. The patient was provided an opportunity to ask questions and all were answered. The patient agreed with the plan and demonstrated an understanding of the instructions. The patient was advised to call back or seek an in-person evaluation if the symptoms worsen or if the condition fails to improve as anticipated.   I provided 12 minutes of non-face-to-face time during this encounter.  This includes time for charting and coordination of care   Harriette Ohara, MD

## 2022-05-10 ENCOUNTER — Ambulatory Visit (HOSPITAL_COMMUNITY)
Admission: RE | Admit: 2022-05-10 | Discharge: 2022-05-10 | Disposition: A | Discharge status: Home / Self Care | Payer: Medicare Other | Source: Ambulatory Visit | Attending: Cardiology | Admitting: Cardiology

## 2022-05-10 DIAGNOSIS — I428 Other cardiomyopathies: Secondary | ICD-10-CM | POA: Diagnosis not present

## 2022-05-10 DIAGNOSIS — C50412 Malignant neoplasm of upper-outer quadrant of left female breast: Secondary | ICD-10-CM | POA: Diagnosis not present

## 2022-05-10 DIAGNOSIS — I447 Left bundle-branch block, unspecified: Secondary | ICD-10-CM | POA: Diagnosis not present

## 2022-05-10 DIAGNOSIS — I5042 Chronic combined systolic (congestive) and diastolic (congestive) heart failure: Secondary | ICD-10-CM | POA: Diagnosis not present

## 2022-05-10 DIAGNOSIS — Z171 Estrogen receptor negative status [ER-]: Secondary | ICD-10-CM | POA: Insufficient documentation

## 2022-05-10 DIAGNOSIS — I517 Cardiomegaly: Secondary | ICD-10-CM | POA: Diagnosis not present

## 2022-05-10 MED ORDER — TECHNETIUM TC 99M-LABELED RED BLOOD CELLS IV KIT
21.4000 | PACK | Freq: Once | INTRAVENOUS | Status: AC | PRN
  Administered 2022-05-10: 21.4 via INTRAVENOUS

## 2022-05-14 ENCOUNTER — Other Ambulatory Visit: Payer: Self-pay

## 2022-05-15 ENCOUNTER — Other Ambulatory Visit: Payer: Self-pay | Admitting: Hematology and Oncology

## 2022-05-15 ENCOUNTER — Telehealth: Payer: Self-pay

## 2022-05-15 NOTE — Telephone Encounter (Signed)
Pts daughter called requesting MUGA test results. Please advise.

## 2022-05-17 ENCOUNTER — Ambulatory Visit (INDEPENDENT_AMBULATORY_CARE_PROVIDER_SITE_OTHER): Payer: Medicare Other | Admitting: Student

## 2022-05-17 ENCOUNTER — Encounter: Payer: Self-pay | Admitting: Student

## 2022-05-17 VITALS — BP 100/66 | HR 81 | Temp 98.2°F

## 2022-05-17 DIAGNOSIS — Z923 Personal history of irradiation: Secondary | ICD-10-CM

## 2022-05-17 DIAGNOSIS — Z9889 Other specified postprocedural states: Secondary | ICD-10-CM

## 2022-05-17 DIAGNOSIS — T8131XA Disruption of external operation (surgical) wound, not elsewhere classified, initial encounter: Secondary | ICD-10-CM

## 2022-05-17 DIAGNOSIS — Z9013 Acquired absence of bilateral breasts and nipples: Secondary | ICD-10-CM | POA: Diagnosis not present

## 2022-05-17 NOTE — Progress Notes (Signed)
Referring Provider [name], MD [place],  [place] [address]   CC:  Chief Complaint  Patient presents with   Post-op Follow-up      Jordan Willis is an 71 y.o. female.  HPI: Patient is a 71 year old female with history of breast cancer status post reconstruction with implant exchange performed on 12/19/2021 and subsequent removal of bilateral implants performed on 02/05/2022 by [name] due to right side exposure.  Patient has history of radiation therapy bilaterally.  Patient presents today with concern for drainage and pain from the incision sites.  Patient was last seen in the clinic on 04/18/2022.  At this visit, patient felt like things were coming along.  She reported less drainage on the left side.  On exam, there was a pinpoint wound lateral on the left side of the incision.  There was no drainage or signs of infection at that time.  Plan was to see patient 6 weeks later.  Today, patient states she is doing okay.  She reports that she has an area to the left breast that has been draining since she was last seen in the clinic.  She states the drainage has gone down significantly to just some "spotting".  Patient reports she feels this drainage is malodorous.  She states she cannot see what color it is but states her daughter said it was green.  Patient states the drainage became malodorous within the past few days.  Patient denies any redness or swelling.  Patient also reports some pain along her ribs bilaterally.  She states she takes ibuprofen for this pain.  Patient reports she has been started on a new medication, anastrozole, by her oncologist.  She reports that since she has started this medication, it has caused her to have episodes of hot flashes, episodes of being cold and other side effects such as joint pain.  Patient states she does not think she has had fevers or chills, but she cannot say for sure given the side effects of the  medication.  Review of Systems General: Does not think she has had fevers or chills, reports drainage from left breast wound   Physical Exam    05/17/2022    1:46 PM 04/27/2022    8:26 AM 04/09/2022   11:44 AM  Vitals with BMI  Height  '5\' 7"'$  '5\' 7"'$   Weight  183 lbs 8 oz 185 lbs 6 oz  BMI  15.40 08.67  Systolic 619 509 326  Diastolic 66 66 75  Pulse 81 73 91    General:  No acute distress,  Alert and oriented, Non-Toxic, Normal speech and affect  Chaperone present on exam.  On exam, patient sitting upright in no acute distress.  Her vitals are stable.  Chest wall is fairly symmetrical.  Right breast incision appears to be intact and healed.  The left breast incision has a small approximately 0.5 centimeter wound to the lateral aspect.  There is no surrounding erythema or drainage noted on exam.  There is no cellulitic changes to the skin.  No malodor noted on exam.  There are no fluid collections palpated underneath.  There is no overlying erythema of the left chest wall.   Assessment/Plan  S/P breast reconstruction   Discussed with the patient that there does not appear to be infection at this time.  Discussed with the patient that she can put a small amount of Vaseline and gauze over the wound daily.  Recommended to  the patient that she discuss with her oncology provider the symptoms that she is experiencing after starting the anastrozole.  Patient acknowledged.  Recommended that the patient continue physical therapy for pain.  Discussed with patient that she should exercise caution when taking ibuprofen as significant and long-term use can cause kidney issues.  Patient expressed understanding.  Patient to follow-up in 2 weeks for her already scheduled appointment.  Instructed patient to call if she has any other questions or concerns.  Clance Boll 05/17/2022, 1:51 PM

## 2022-05-21 ENCOUNTER — Other Ambulatory Visit: Payer: Self-pay | Admitting: Cardiology

## 2022-05-21 DIAGNOSIS — E7800 Pure hypercholesterolemia, unspecified: Secondary | ICD-10-CM | POA: Diagnosis not present

## 2022-05-21 DIAGNOSIS — E1165 Type 2 diabetes mellitus with hyperglycemia: Secondary | ICD-10-CM | POA: Diagnosis not present

## 2022-05-21 DIAGNOSIS — I10 Essential (primary) hypertension: Secondary | ICD-10-CM | POA: Diagnosis not present

## 2022-05-21 DIAGNOSIS — I428 Other cardiomyopathies: Secondary | ICD-10-CM

## 2022-05-21 DIAGNOSIS — I5042 Chronic combined systolic (congestive) and diastolic (congestive) heart failure: Secondary | ICD-10-CM

## 2022-05-21 DIAGNOSIS — I447 Left bundle-branch block, unspecified: Secondary | ICD-10-CM

## 2022-05-21 NOTE — Telephone Encounter (Signed)
Spoke to her regarding her MUGA Scan.  Will refer her to EP for possible BiV given her cardiomyopathy.  Please have Rise Paganini arrange the consult w/ them ASAP.   [name]: Just FYI. Let me know your thoughts otherwise.   Dr. Terri Skains

## 2022-05-22 NOTE — Telephone Encounter (Signed)
Spoke to her regarding her MUGA Scan.  Will refer her to EP for possible BiV given her cardiomyopathy.  Please have Rise Paganini arrange the consult w/ them ASAP.   [name]: Just FYI. Let me know your thoughts otherwise.   Dr. Terri Skains

## 2022-05-24 NOTE — Progress Notes (Signed)
Patient Care Team: [name], MD as PCP - General (Internal Medicine) Delsa Bern, MD as Attending Physician (Obstetrics and Gynecology) Posey Boyer, MD (Inactive) as Consulting Physician (Family Medicine) Rockwell Germany, RN as Oncology Nurse Navigator Mauro Kaufmann, RN as Oncology Nurse Navigator  DIAGNOSIS:  Encounter Diagnosis  Name Primary?   Malignant neoplasm of upper-outer quadrant of left breast in female, estrogen receptor negative ([place])     SUMMARY OF ONCOLOGIC HISTORY: Oncology History  Malignant neoplasm of upper-outer quadrant of left breast in female, estrogen receptor negative ([place])  02/02/2005 Initial Diagnosis   Left breast needle core biopsy showed invasive mammary cancer   02/11/2005 Breast MRI   Left breast upper outer quadrant: 4.4 x 2.6 x 3.7 cm mass and a smaller adjacent mass measuring 1 x 0.8 x 0.4 cm   03/02/2005 - 07/20/2005 Neo-Adjuvant Chemotherapy   AC4 followed by Taxotere 4   08/03/2005 Surgery   Left breast lumpectomy: 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%, PR 0%, HER-2 negative, Ki-67 31%   09/26/2005 - 11/13/2005 Radiation Therapy   Adjuvant radiation therapy   03/18/2008 Initial Biopsy   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   03/22/2008 Surgery   Right breast lumpectomy 0.9 cm DCIS ER 90% PR 98% stage 0   04/11/2008 Procedure   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   05/10/2008 - 07/02/2008 Radiation Therapy   Adjuvant radiation therapy   07/17/2008 - 04/16/2009 Anti-estrogen oral therapy   Femara then switched to tamoxifen which was discontinued in 2010 due to concern about uterine cancer   05/23/2021 Relapse/Recurrence   Screening detected right breast mass at 10 o'clock position measuring 1.2 cm, ultrasound-guided biopsy revealed grade 3 IDC ER 20%, PR 0%, HER2 positive 3+, Ki-67 50%   05/31/2021 Cancer Staging    Staging form: Breast, AJCC 7th Edition - Clinical: Stage IA (rT1c, N0, M0) - Signed by Nicholas Lose, MD on 05/31/2021 Stage prefix: Recurrence Laterality: Right Biopsy of metastatic site performed: No Estrogen receptor status: Positive Progesterone receptor status: Negative HER2 status: Positive   09/04/2021 Surgery   Bilateral mastectomies Left mastectomy: Benign Right mastectomy: Grade 3 IDC with DCIS 3.6 cm, 0/6 lymph nodes negative, ER 20%, PR 0%, HER2 positive by IHC, Ki-67 50%   09/12/2021 Cancer Staging   Staging form: Breast, AJCC 7th Edition - Pathologic: Stage IIA (T2, N0, cM0) - Signed by Nicholas Lose, MD on 09/12/2021 Estrogen receptor status: Positive Progesterone receptor status: Positive HER2 status: Negative   Breast cancer of upper-outer quadrant of right female breast ([place])  09/04/2021 Initial Diagnosis   Breast cancer of upper-outer quadrant of right female breast ([place])   11/21/2021 -  Chemotherapy   Patient is on Treatment Plan : BREAST Trastuzumab q21d       CHIEF COMPLIANT: Follow-up to discuss anastrozole tolerance  INTERVAL HISTORY: Jordan Willis is a 71- year-old patient with the above mentioned   history of right breast cancer having undergone bilateral mastectomies, currently on chemotherapy with Trastzumab. She presents to the clinic today for follow-up    ALLERGIES:  is allergic to bactrim [sulfamethoxazole-trimethoprim], aspirin, sulfa antibiotics, and tape.  MEDICATIONS:  Current Outpatient Medications  Medication Sig Dispense Refill   anastrozole (ARIMIDEX) 1 MG tablet TAKE 1 TABLET BY MOUTH EVERY DAY 90 tablet 0   Ascorbic Acid (VITAMIN C PO) Take 500 mg by mouth daily.  atorvastatin (LIPITOR) 20 MG tablet TAKE 1 TABLET BY MOUTH EVERYDAY AT BEDTIME 90 tablet 1   b complex vitamins capsule Take 1 capsule by mouth daily.     bumetanide (BUMEX) 1 MG tablet TAKE 1 TABLET BY MOUTH EVERY DAY 30 tablet 1   cetirizine (ZYRTEC) 10 MG tablet  Take 10 mg by mouth in the morning.     Cholecalciferol (VITAMIN D3) 25 MCG (1000 UT) CAPS Take 1,000 Units by mouth in the morning.     FARXIGA 10 MG TABS tablet TAKE 1 TABLET BY MOUTH EVERY DAY BEFORE BREAKFAST 90 tablet 0   fluticasone (FLONASE) 50 MCG/ACT nasal spray Place 1 spray into both nostrils in the morning.     GLUCOSAMINE-CHONDROITIN PO Take 1 tablet by mouth every evening.      glucose blood (ONETOUCH VERIO) test strip TEST ONCE DAILY AS DIRECTED 90     ibuprofen (ADVIL) 200 MG tablet Take 400 mg by mouth every 6 (six) hours as needed (for pain.).     metFORMIN (GLUCOPHAGE-XR) 500 MG 24 hr tablet Take 500 mg by mouth 2 (two) times daily.     metoprolol succinate (TOPROL-XL) 25 MG 24 hr tablet TAKE 1 TABLET (25 MG TOTAL) BY MOUTH IN THE MORNING. HOLD IF SYSTOLIC BLOOD PRESSURE (TOP BLOOD PRESSURE NUMBER) LESS THAN 100 MMHG OR HEART RATE LESS THAN 60 BPM (PULSE). (Patient taking differently: Take 25 mg by mouth daily. Hold if systolic blood pressure (top blood pressure number) is less than 100 MMHG or heart rate less than 60 BPM (pulse)) 90 tablet 3   Multiple Vitamin (MULTIVITAMIN WITH MINERALS) TABS tablet Take 1 tablet by mouth daily. Centrum Silver for Women 50+     Olopatadine HCl 0.2 % SOLN Place 1 drop into both eyes daily as needed (allergies and itching eyes). Pataday     OneTouch Delica Lancets 14G MISC Apply topically.     Probiotic Product (PROBIOTIC DAILY PO) Take 1 capsule by mouth in the morning.     sacubitril-valsartan (ENTRESTO) 49-51 MG Take 1 tablet by mouth 2 (two) times daily. 60 tablet 0   sertraline (ZOLOFT) 50 MG tablet Take 50 mg by mouth in the morning.     solifenacin (VESICARE) 10 MG tablet Take 10 mg by mouth in the morning.     spironolactone (ALDACTONE) 25 MG tablet Take 1 tablet (25 mg total) by mouth every morning. 90 tablet 0   traZODone (DESYREL) 50 MG tablet Take 50 mg by mouth at bedtime as needed for sleep.     No current facility-administered  medications for this visit.    PHYSICAL EXAMINATION: ECOG PERFORMANCE STATUS: 1 - Symptomatic but completely ambulatory  Vitals:   06/04/22 0904  BP: 133/68  Pulse: 74  Resp: 16  Temp: (!) 97 F (36.1 C)  SpO2: 100%   Filed Weights   06/04/22 0904  Weight: 183 lb 4.8 oz (83.1 kg)      LABORATORY DATA:  I have reviewed the data as listed    Latest Ref Rng & Units 03/28/2022    7:58 AM 03/27/2022    7:32 AM 03/06/2022    8:07 AM  CMP  Glucose 70 - 99 mg/dL 115  120  101   BUN 8 - 23 mg/dL 10  9  11    Creatinine 0.44 - 1.00 mg/dL 0.62  0.65  0.73   Sodium 135 - 145 mmol/L 138  140  139   Potassium 3.5 - 5.1 mmol/L 3.7  3.8  4.7   Chloride 98 - 111 mmol/L 106  107  101   CO2 22 - 32 mmol/L 24  24  24    Calcium 8.9 - 10.3 mg/dL 9.8  9.8  10.1   Total Protein 6.5 - 8.1 g/dL 7.9     Total Bilirubin 0.3 - 1.2 mg/dL 0.4     Alkaline Phos 38 - 126 U/L 89     AST 15 - 41 U/L 15     ALT 0 - 44 U/L 11       Lab Results  Component Value Date   WBC 3.8 (L) 03/28/2022   HGB 12.5 03/28/2022   HCT 39.0 03/28/2022   MCV 87.1 03/28/2022   PLT 285 03/28/2022   NEUTROABS 1.9 03/28/2022    ASSESSMENT & PLAN:  Malignant neoplasm of upper-outer quadrant of left breast in female, estrogen receptor negative ([place]) 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% status post lumpectomy radiation and 5 years of tamoxifen completed 04/16/2009 Recurrence: 05/23/2021: Screening mammogram detected right breast mass 10 o'clock position 1.2 cm,  biopsy with grade 3 IDC ER 20%, PR 0%, HER2 positive, Ki-67 5%) Bilateral mastectomies: Right mastectomy: Grade 3 IDC with DCIS 3.6 cm, 0/6 lymph nodes negative, ER 20%, PR 20%, HER2 positive by IHC, Ki-67 50% Received 4 cycles of Herceptin stopped because of decline in ejection fraction BRCA 2 mutation ---------------------------------------------------------------------------------------------------------------------------------------------------- Non-ischemic cardiomyopathy: NYHA clas 2: follows with cardiology. Heart cath: Normal. MUGA planned  Headaches: brain MRI 04/30/22: No mets, chronic microvascular ischemic changes   Recommendation: Anastrozole started 05/03/22 Anastrozole toxicities: Recurrent headaches requiring ibuprofen  Recommended stopping anastrozole for 2 weeks Telephone call after that to discuss switching to exemestane.  Surveillance: She had bilateral mastectomies so there is no role of imaging  Because of her cardiac issues there is no further plan to do Herceptin anymore. Plans per Vernia Buff on 07/06/2022 for a wedding. Her daughter is her primary caregiver and she has been managing her wounds and helping her with recovery.  If necessary we will fill paperwork to enable her to get some payment from the insurance.     No orders of the defined types were placed in this encounter.  The patient has a good understanding of the overall plan. she agrees with it. she will call with any problems that may develop before the next visit here. Total time spent: 30 mins including face to face time and time spent for planning, charting and co-ordination of care   Harriette Ohara, MD 06/04/22    I Gardiner Coins am scribing for [name]  I have reviewed the above documentation for accuracy and completeness, and I agree with the above.

## 2022-05-25 ENCOUNTER — Other Ambulatory Visit: Payer: Self-pay | Admitting: Hematology and Oncology

## 2022-05-25 ENCOUNTER — Other Ambulatory Visit: Payer: Self-pay | Admitting: Cardiology

## 2022-05-28 ENCOUNTER — Other Ambulatory Visit: Payer: Self-pay | Admitting: Cardiology

## 2022-05-28 DIAGNOSIS — I428 Other cardiomyopathies: Secondary | ICD-10-CM

## 2022-05-29 ENCOUNTER — Ambulatory Visit (INDEPENDENT_AMBULATORY_CARE_PROVIDER_SITE_OTHER): Payer: Medicare Other | Admitting: Podiatry

## 2022-05-29 ENCOUNTER — Encounter: Payer: Self-pay | Admitting: Podiatry

## 2022-05-29 DIAGNOSIS — E1142 Type 2 diabetes mellitus with diabetic polyneuropathy: Secondary | ICD-10-CM | POA: Diagnosis not present

## 2022-05-29 NOTE — Progress Notes (Signed)
This patient presents to the office for diabetic foot exam. This patient says there is no pain or discomfort in her feet.  No history of infection or drainage.  This patient presents to the office for foot exam due to having a history of diabetes.  Vascular  Dorsalis pedis and posterior tibial pulses are palpable  B/L.  Capillary return  WNL.  Temperature gradient is  WNL.  Skin turgor  WNL  Sensorium  Senn Weinstein monofilament wire  WNL. Normal tactile sensation.  Nail Exam  Patient has normal nails with no evidence of bacterial or fungal infection.  Orthopedic  Exam  Muscle tone and muscle strength  WNL.  No limitations of motion feet  B/L.  No crepitus or joint effusion noted.  Foot type is unremarkable and digits show no abnormalities.  Mild  HAV  B/L.  Skin  No open lesions.  Normal skin texture and turgor.   Diabetes with no complications  Diabetic foot exam was performed.  There is no evidence of vascular or neurologic pathology.  RTC  1 year.  Patient dispensed compression sock for left ankle pain.   Gardiner Barefoot DPM

## 2022-05-30 ENCOUNTER — Other Ambulatory Visit: Payer: Self-pay

## 2022-05-30 DIAGNOSIS — E042 Nontoxic multinodular goiter: Secondary | ICD-10-CM | POA: Diagnosis not present

## 2022-05-30 DIAGNOSIS — E1165 Type 2 diabetes mellitus with hyperglycemia: Secondary | ICD-10-CM | POA: Diagnosis not present

## 2022-05-30 DIAGNOSIS — E059 Thyrotoxicosis, 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: Secondary | ICD-10-CM | POA: Diagnosis not present

## 2022-05-30 DIAGNOSIS — E118 Type 2 diabetes mellitus with unspecified complications: Secondary | ICD-10-CM | POA: Diagnosis not present

## 2022-05-30 DIAGNOSIS — E7800 Pure hypercholesterolemia, unspecified: Secondary | ICD-10-CM | POA: Diagnosis not present

## 2022-06-01 ENCOUNTER — Telehealth: Payer: Self-pay

## 2022-06-01 NOTE — Telephone Encounter (Signed)
Return call to pt, she states she is having headaches again and is taking ibuprofen to help.  Pt states '800mg'$  is helping her and wants to know if she can continue that through the weekend.  I advised pt she can take '800mg'$  for her symptoms and we will reassess at her appointment on Monday.  Pt prior scan did not show metastatic disease.  Pt verbalized understanding and thanks .

## 2022-06-04 ENCOUNTER — Other Ambulatory Visit: Payer: Self-pay

## 2022-06-04 ENCOUNTER — Inpatient Hospital Stay: Payer: Medicare Other | Attending: Hematology and Oncology | Admitting: Hematology and Oncology

## 2022-06-04 DIAGNOSIS — Z803 Family history of malignant neoplasm of breast: Secondary | ICD-10-CM | POA: Diagnosis not present

## 2022-06-04 DIAGNOSIS — Z9013 Acquired absence of bilateral breasts and nipples: Secondary | ICD-10-CM | POA: Insufficient documentation

## 2022-06-04 DIAGNOSIS — Z923 Personal history of irradiation: Secondary | ICD-10-CM | POA: Insufficient documentation

## 2022-06-04 DIAGNOSIS — C50412 Malignant neoplasm of upper-outer quadrant of left female breast: Secondary | ICD-10-CM | POA: Diagnosis not present

## 2022-06-04 DIAGNOSIS — Z171 Estrogen receptor negative status [ER-]: Secondary | ICD-10-CM | POA: Diagnosis not present

## 2022-06-04 NOTE — Assessment & Plan Note (Addendum)
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% status post lumpectomy radiation and 5 years of tamoxifen completed 04/16/2009 Recurrence: 05/23/2021: Screening mammogram detected right breast mass 10 o'clock position 1.2 cm, biopsy with grade 3 IDC ER 20%, PR 0%, HER2 positive, Ki-67 5%) Bilateral mastectomies: Right mastectomy: Grade 3 IDC with DCIS 3.6 cm, 0/6 lymph nodes negative, ER 20%, PR 20%, HER2 positive by IHC, Ki-67 50% Received 4 cycles of Herceptin stopped because of decline in ejection fraction BRCA 2 mutation ---------------------------------------------------------------------------------------------------------------------------------------------------- Non-ischemic cardiomyopathy: NYHA clas 2: follows with cardiology. Heart cath: Normal. MUGA planned Headaches: brain MRI 04/30/22: No mets, chronic microvascular ischemic changes  Recommendation: Anastrozole started 05/03/22 Anastrozole toxicities: Recurrent headaches requiring ibuprofen  Recommended stopping anastrozole for 2 weeks Telephone call after that to discuss switching to exemestane.  Surveillance: She had bilateral mastectomies so there is no role of imaging  Because of her cardiac issues there is no further plan to do Herceptin anymore. Plans per Vernia Buff on 07/06/2022 for a wedding. Her daughter is her primary caregiver and she has been managing her wounds and helping her with recovery.  If necessary we will fill paperwork to enable her to get some payment from  the insurance.

## 2022-06-05 ENCOUNTER — Telehealth: Payer: Self-pay | Admitting: Hematology and Oncology

## 2022-06-05 DIAGNOSIS — K219 Gastro-esophageal reflux disease without esophagitis: Secondary | ICD-10-CM | POA: Diagnosis not present

## 2022-06-05 DIAGNOSIS — K5900 Constipation, unspecified: Secondary | ICD-10-CM | POA: Diagnosis not present

## 2022-06-05 DIAGNOSIS — Z86010 Personal history of colonic polyps: Secondary | ICD-10-CM | POA: Diagnosis not present

## 2022-06-05 DIAGNOSIS — Z1211 Encounter for screening for malignant neoplasm of colon: Secondary | ICD-10-CM | POA: Diagnosis not present

## 2022-06-05 NOTE — Telephone Encounter (Signed)
Scheduled appointment per 8/14 los. Patient is aware.

## 2022-06-06 ENCOUNTER — Ambulatory Visit: Payer: Medicare Other | Admitting: Plastic Surgery

## 2022-06-06 ENCOUNTER — Ambulatory Visit: Payer: Medicare Other | Admitting: Surgical

## 2022-06-07 DIAGNOSIS — E118 Type 2 diabetes mellitus with unspecified complications: Secondary | ICD-10-CM | POA: Diagnosis not present

## 2022-06-07 DIAGNOSIS — E7800 Pure hypercholesterolemia, unspecified: Secondary | ICD-10-CM | POA: Diagnosis not present

## 2022-06-07 DIAGNOSIS — E059 Thyrotoxicosis, 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: Secondary | ICD-10-CM | POA: Diagnosis not present

## 2022-06-07 DIAGNOSIS — E042 Nontoxic multinodular goiter: Secondary | ICD-10-CM | POA: Diagnosis not present

## 2022-06-10 ENCOUNTER — Other Ambulatory Visit: Payer: Self-pay | Admitting: Cardiology

## 2022-06-10 DIAGNOSIS — I428 Other cardiomyopathies: Secondary | ICD-10-CM

## 2022-06-10 DIAGNOSIS — I5042 Chronic combined systolic (congestive) and diastolic (congestive) heart failure: Secondary | ICD-10-CM

## 2022-06-10 DIAGNOSIS — I10 Essential (primary) hypertension: Secondary | ICD-10-CM

## 2022-06-10 DIAGNOSIS — I429 Cardiomyopathy, unspecified: Secondary | ICD-10-CM

## 2022-06-12 ENCOUNTER — Ambulatory Visit (INDEPENDENT_AMBULATORY_CARE_PROVIDER_SITE_OTHER): Payer: Medicare Other | Admitting: Internal Medicine

## 2022-06-12 ENCOUNTER — Encounter: Payer: Self-pay | Admitting: Internal Medicine

## 2022-06-12 VITALS — BP 126/64 | HR 91 | Ht 67.0 in | Wt 181.2 lb

## 2022-06-12 DIAGNOSIS — I428 Other cardiomyopathies: Secondary | ICD-10-CM | POA: Diagnosis not present

## 2022-06-12 DIAGNOSIS — I5032 Chronic diastolic (congestive) heart failure: Secondary | ICD-10-CM | POA: Diagnosis not present

## 2022-06-12 DIAGNOSIS — I447 Left bundle-branch block, unspecified: Secondary | ICD-10-CM

## 2022-06-12 NOTE — Progress Notes (Signed)
HEMATOLOGY-ONCOLOGY TELEPHONE VISIT PROGRESS NOTE  I connected with our patient on 06/19/22 at  9:45 AM EDT by telephone and verified that I am speaking with the correct person using two identifiers.  I discussed the limitations, risks, security and privacy concerns of performing an evaluation and management service by telephone and the availability of in person appointments.  I also discussed with the patient that there may be a patient responsible charge related to this service. The patient expressed understanding and agreed to proceed.   History of Present Illness: Jordan Willis is a 71- year-old patient with the above mentioned   history of right breast cancer having undergone bilateral mastectomies, was on anastrozole and was discontinued because of intense headaches. She presents to the clinic today for telephone follow-up.  She tells me that the headaches have improved significantly since she stopped anastrozole.  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%, PR 0%, HER-2 negative, Ki-67 31%   09/26/2005 - 11/13/2005 Radiation Therapy   Adjuvant radiation therapy   03/18/2008 Initial Biopsy   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   03/22/2008 Surgery   Right breast lumpectomy 0.9 cm DCIS ER 90% PR 98% stage 0   04/11/2008 Procedure   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   05/10/2008 - 07/02/2008 Radiation  Therapy   Adjuvant radiation therapy   07/17/2008 - 04/16/2009 Anti-estrogen oral therapy   Femara then switched to tamoxifen which was discontinued in 2010 due to concern about uterine cancer   05/23/2021 Relapse/Recurrence   Screening detected right breast mass at 10 o'clock position measuring 1.2 cm, ultrasound-guided biopsy revealed grade 3 IDC ER 20%, PR 0%, HER2 positive 3+, Ki-67 50%   05/31/2021 Cancer Staging   Staging form: Breast, AJCC 7th Edition - Clinical: Stage IA (rT1c, N0, M0) - Signed by Nicholas Lose, MD on 05/31/2021 Stage prefix: Recurrence Laterality: Right Biopsy of metastatic site performed: No Estrogen receptor status: Positive Progesterone receptor status: Negative HER2 status: Positive   09/04/2021 Surgery   Bilateral mastectomies Left mastectomy: Benign Right mastectomy: Grade 3 IDC with DCIS 3.6 cm, 0/6 lymph nodes negative, ER 20%, PR 0%, HER2 positive by IHC, Ki-67 50%   09/12/2021 Cancer Staging   Staging form: Breast, AJCC 7th Edition - Pathologic: Stage IIA (T2, N0, cM0) - Signed by Nicholas Lose, MD on 09/12/2021 Estrogen receptor status: Positive Progesterone receptor status: Positive HER2 status: Negative   Breast cancer of upper-outer quadrant of right female breast ([place])  09/04/2021 Initial Diagnosis   Breast cancer of upper-outer quadrant of right female breast ([place])   11/21/2021 -  Chemotherapy   Patient is on Treatment Plan : BREAST Trastuzumab q21d       REVIEW OF SYSTEMS:   Constitutional: Denies fevers, chills or abnormal weight loss All other systems were reviewed with the patient and are negative. Observations/Objective:     Assessment Plan:  Malignant neoplasm of  upper-outer quadrant of left breast in female, estrogen receptor negative (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% status post lumpectomy radiation and 5 years of tamoxifen completed 04/16/2009 Recurrence: 05/23/2021: Screening mammogram detected right breast mass 10 o'clock position 1.2 cm, biopsy with grade 3 IDC ER 20%, PR 0%, HER2 positive, Ki-67 5%) Bilateral mastectomies: Right mastectomy: Grade 3 IDC with DCIS 3.6 cm, 0/6 lymph nodes negative, ER 20%, PR 20%, HER2 positive by IHC, Ki-67 50% Received 4 cycles of Herceptin stopped because of decline in ejection fraction BRCA 2 mutation ---------------------------------------------------------------------------------------------------------------------------------------------------- Non-ischemic cardiomyopathy: NYHA clas 2: follows with cardiology. Heart cath: Normal. MUGA planned  Headaches: brain MRI 04/30/22: No mets, chronic microvascular ischemic changes   Recommendation: Anastrozole started 05/03/22 stopped 06/05/22, Switched to Exemestane Anastrozole toxicities: Recurrent headaches requiring ibuprofen She has a wedding in [place] in September and she will start the exemestane when she comes back from the waiting around mid September.     Surveillance: She had bilateral mastectomies so there is no role of imaging   Because of her cardiac issues there is no further plan to do Herceptin anymore. Telephone visit in 6 weeks to discuss tolerance to exemestane.    I discussed the assessment and treatment plan with the patient. The patient was provided an opportunity to ask questions and all were answered. The patient agreed with the plan and demonstrated an understanding of the instructions. The patient was advised to call back or  seek an in-person evaluation if the symptoms worsen or if the condition fails to improve as anticipated.   I provided 12 minutes of non-face-to-face time during this encounter.  This includes time for charting and coordination of care   Harriette Ohara, MD   I Gardiner Coins am scribing for [name]  I have reviewed the above documentation for accuracy and completeness, and I agree with the above. Marland Kitchen

## 2022-06-12 NOTE — Patient Instructions (Addendum)
Medication Instructions:  Your physician recommends that you continue on your current medications as directed. Please refer to the Current Medication list given to you today.  *If you need a refill on your cardiac medications before your next appointment, please call your pharmacy*  NO CHANGES WITH YOUR MEDICATIONS  Lab Work: None ordered.  If you have labs (blood work) drawn today and your tests are completely normal, you will receive your results only by: [place] (if you have MyChart) OR A paper copy in the mail If you have any lab test that is abnormal or we need to change your treatment, we will call you to review the results.  Testing/Procedures: None ordered.  Follow-Up:  THE FOLLOWING DATES ARE AVAILABLE FOR A PACEMAKER OR ICD IMPLANT PROCEDURE.    SEPT: 8, 18, 22 OCT: 2, 4, 9, 20  Pacemaker Implantation, Adult Pacemaker implantation is a procedure to place a pacemaker inside the chest. A pacemaker is a small computer that sends electrical signals to the heart and helps the heart beat normally. A pacemaker also stores information about heart rhythms. You may need pacemaker implantation if you have: A slow heartbeat (bradycardia). Loss of consciousness that happens repeatedly (syncope) or repeated episodes of dizziness or light-headedness because of an irregular heart rate. Shortness of breath (dyspnea) due to heart problems. The pacemaker usually attaches to your heart through a wire called a lead. One or two leads may be needed. There are different types of pacemakers: Transvenous pacemaker. This type is placed under the skin or muscle of your upper chest area. The lead goes through a vein in the chest area to reach the inside of the heart. Epicardial pacemaker. This type is placed under the skin or muscle of your chest or abdomen. The lead goes through your chest to the outside of the heart. Tell a health care provider about: Any allergies you have. All medicines you  are taking, including vitamins, herbs, eye drops, creams, and over-the-counter medicines. Any problems you or family members have had with anesthetic medicines. Any blood or bone disorders you have. Any surgeries you have had. Any medical conditions you have. Whether you are pregnant or may be pregnant. What are the risks? Generally, this is a safe procedure. However, problems may occur, including: Infection. Bleeding. Failure of the pacemaker or the lead. Collapse of a lung or bleeding into a lung. Blood clot inside a blood vessel with a lead. Damage to the heart. Infection inside the heart (endocarditis). Allergic reactions to medicines. What happens before the procedure? Staying hydrated Follow instructions from your health care provider about hydration, which may include: Up to 2 hours before the procedure - you may continue to drink clear liquids, such as water, clear fruit juice, black coffee, and plain tea.  Eating and drinking restrictions Follow instructions from your health care provider about eating and drinking, which may include: 8 hours before the procedure - stop eating heavy meals or foods, such as meat, fried foods, or fatty foods. 6 hours before the procedure - stop eating light meals or foods, such as toast or cereal. 6 hours before the procedure - stop drinking milk or drinks that contain milk. 2 hours before the procedure - stop drinking clear liquids. Medicines Ask your health care provider about: Changing or stopping your regular medicines. This is especially important if you are taking diabetes medicines or blood thinners. Taking medicines such as aspirin and ibuprofen. These medicines can thin your blood. Do not take these medicines  unless your health care provider tells you to take them. Taking over-the-counter medicines, vitamins, herbs, and supplements. Tests You may have: A heart evaluation. This may include: An electrocardiogram (ECG). This involves  placing patches on your skin to check your heart rhythm. A chest X-ray. An echocardiogram. This is a test that uses sound waves (ultrasound) to produce an image of the heart. A cardiac rhythm monitor. This is used to record your heart rhythm and any events for a longer period of time. Blood tests. Genetic testing. General instructions Do not use any products that contain nicotine or tobacco for at least 4 weeks before the procedure. These products include cigarettes, e-cigarettes, and chewing tobacco. If you need help quitting, ask your health care provider. Ask your health care provider: How your surgery site will be marked. What steps will be taken to help prevent infection. These steps may include: Removing hair at the surgery site. Washing skin with a germ-killing soap. Receiving antibiotic medicine. Plan to have someone take you home from the hospital or clinic. If you will be going home right after the procedure, plan to have someone with you for 24 hours. What happens during the procedure? An IV will be inserted into one of your veins. You will be given one or more of the following: A medicine to help you relax (sedative). A medicine to numb the area (local anesthetic). A medicine to make you fall asleep (general anesthetic). The next steps vary depending on the type of pacemaker you will be getting. If you are getting a transvenous pacemaker: An incision will be made in your upper chest. A pocket will be made for the pacemaker. It may be placed under the skin or between layers of muscle. The lead will be inserted into a blood vessel that goes to the heart. While X-rays are taken by an imaging machine (fluoroscopy), the lead will be advanced through the vein to the inside of your heart. The other end of the lead will be tunneled under the skin and attached to the pacemaker. If you are getting an epicardial pacemaker: An incision will be made near your ribs or breastbone (sternum)  for the lead. The lead will be attached to the outside of your heart. Another incision will be made in your chest or upper abdomen to create a pocket for the pacemaker. The free end of the lead will be tunneled under the skin and attached to the pacemaker. The transvenous or epicardial pacemaker will be tested. Imaging studies may be done to check the lead position. The incisions will be closed with stitches (sutures), adhesive strips, or skin glue. Bandages (dressings) will be placed over the incisions. The procedure may vary among health care providers and hospitals. What happens after the procedure? Your blood pressure, heart rate, breathing rate, and blood oxygen level will be monitored until you leave the hospital or clinic. You may be given antibiotics. You will be given pain medicine. An ECG and chest X-rays will be done. You may need to wear a continuous type of ECG (Holter monitor) to check your heart rhythm. Your health care provider will program the pacemaker. If you were given a sedative during the procedure, it can affect you for several hours. Do not drive or operate machinery until your health care provider says that it is safe. You will be given a pacemaker identification card. This card lists the implant date, device model, and manufacturer of your pacemaker. Summary A pacemaker is a small computer that sends  electrical signals to the heart and helps the heart beat normally. There are different types of pacemakers. A pacemaker may be placed under the skin or muscle of your chest or abdomen. Follow instructions from your health care provider about eating and drinking and about taking medicines before the procedure. This information is not intended to replace advice given to you by your health care provider. Make sure you discuss any questions you have with your health care provider. Document Revised: 06/20/2021 Document Reviewed: 09/09/2019 Elsevier Patient Education  [place]

## 2022-06-12 NOTE — Progress Notes (Signed)
        HPI Jordan Willis is referred by Dr. Tolia for consideration for biv device insertion. She has a h/o breast CA, s/p resection and chemotherapy. She has developed a non-ischemic CM and LBBB associated with her treatment. She has LV dysfunction with an EF of 30% and no obstructive CAD. SHe has class 2 symptoms and her QRS duration is 150 ms. With LBBB. She has not had syncope. She has been on GDMT.       Allergies  Allergen Reactions   Bactrim [Sulfamethoxazole-Trimethoprim] Shortness Of Breath      Shortness of breath, difficulty breathing, headache, rash, fatigue   Aspirin Palpitations and Other (See Comments)      Heart fluttering, pt takes 40.5 mg (half of 81 mg) daily   Sulfa Antibiotics Hives   Tape Rash      Tears skin off              Current Outpatient Medications  Medication Sig Dispense Refill   anastrozole (ARIMIDEX) 1 MG tablet TAKE 1 TABLET BY MOUTH EVERY DAY 90 tablet 0   Ascorbic Acid (VITAMIN C PO) Take 500 mg by mouth daily.       atorvastatin (LIPITOR) 20 MG tablet TAKE 1 TABLET BY MOUTH EVERYDAY AT BEDTIME 90 tablet 1   b complex vitamins capsule Take 1 capsule by mouth daily.       bumetanide (BUMEX) 1 MG tablet TAKE 1 TABLET BY MOUTH EVERY DAY 30 tablet 1   cetirizine (ZYRTEC) 10 MG tablet Take 10 mg by mouth in the morning.       Cholecalciferol (VITAMIN D3) 25 MCG (1000 UT) CAPS Take 1,000 Units by mouth in the morning.       FARXIGA 10 MG TABS tablet TAKE 1 TABLET BY MOUTH EVERY DAY BEFORE BREAKFAST 90 tablet 0   fluticasone (FLONASE) 50 MCG/ACT nasal spray Place 1 spray into both nostrils in the morning.       GLUCOSAMINE-CHONDROITIN PO Take 1 tablet by mouth every evening.        glucose blood (ONETOUCH VERIO) test strip TEST ONCE DAILY AS DIRECTED 90       ibuprofen (ADVIL) 200 MG tablet Take 400 mg by mouth every 6 (six) hours as needed (for pain.).       metFORMIN (GLUCOPHAGE-XR) 500 MG 24 hr tablet Take 500 mg by mouth 2 (two) times daily.        metoprolol succinate (TOPROL-XL) 25 MG 24 hr tablet TAKE 1 TABLET (25 MG TOTAL) BY MOUTH IN THE MORNING. HOLD IF SYSTOLIC BLOOD PRESSURE (TOP BLOOD PRESSURE NUMBER) LESS THAN 100 MMHG OR HEART RATE LESS THAN 60 BPM (PULSE). (Patient taking differently: Take 25 mg by mouth daily. Hold if systolic blood pressure (top blood pressure number) is less than 100 MMHG or heart rate less than 60 BPM (pulse)) 90 tablet 3   Multiple Vitamin (MULTIVITAMIN WITH MINERALS) TABS tablet Take 1 tablet by mouth daily. Centrum Silver for Women 50+       Olopatadine HCl 0.2 % SOLN Place 1 drop into both eyes daily as needed (allergies and itching eyes). Pataday       OneTouch Delica Lancets 33G MISC Apply topically.       Probiotic Product (PROBIOTIC DAILY PO) Take 1 capsule by mouth in the morning.       sacubitril-valsartan (ENTRESTO) 49-51 MG Take 1 tablet by mouth 2 (two) times daily. 60 tablet 0   sertraline (ZOLOFT) 50   MG tablet Take 50 mg by mouth in the morning.       solifenacin (VESICARE) 10 MG tablet Take 10 mg by mouth in the morning.       spironolactone (ALDACTONE) 25 MG tablet TAKE 1 TABLET BY MOUTH EVERY DAY IN THE MORNING 90 tablet 0   traZODone (DESYREL) 50 MG tablet Take 50 mg by mouth at bedtime as needed for sleep.        No current facility-administered medications for this visit.            Past Medical History:  Diagnosis Date   Anemia     Anginal pain (HCC)     Anxiety     Arthritis      Lumbar spine DDD.   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   Diabetes mellitus without complication (HCC)      Type II   Dyspnea      When patient gets sick uses Pro-Air inhaler   Frequent sinus infections     GERD (gastroesophageal reflux disease)      occ   Goiter     History of cardiomegaly     History of  cataract      Bilateral   History of kidney stones      08/28/2018 currently has a large kidney stone, asymptomatic at this time   History of migraine     History of uterine fibroid     History of uterine prolapse     Hyperlipidemia     Hypertension     LBBB (left bundle branch block)     Menopause 03/03/2012   Nasal turbinate hypertrophy 11/29/2016    Left inferior    Personal history of chemotherapy     Personal history of radiation therapy     Pneumonia      as a child   PONV (postoperative nausea and vomiting)     Sinusitis 2014   Sleep apnea      No CPAP   SUI (stress urinary incontinence, female)     SVD (spontaneous vaginal delivery)      x 2   Thyroid nodule        ROS:    All systems reviewed and negative except as noted in the HPI.          Past Surgical History:  Procedure Laterality Date   ABDOMINAL HYSTERECTOMY       BILATERAL SALPINGOOPHORECTOMY Bilateral     BLADDER SUSPENSION N/A 05/16/2017    Procedure: TRANSVAGINAL TAPE (TVT) PROCEDURE;  Surgeon: Roberts, Angela, MD;  Location: [facility];  Service: Gynecology;  Laterality: N/A;   BREAST BIOPSY Right 2009   BREAST IMPLANT REMOVAL Bilateral 02/05/2022    Procedure: Removal bilateral breast implants;  Surgeon: Pace, Collier S, MD;  Location: [facility];  Service: Plastics;  Laterality: Bilateral;   BREAST LUMPECTOMY Right 2009   BREAST LUMPECTOMY Left 2006   BREAST RECONSTRUCTION WITH PLACEMENT OF TISSUE EXPANDER AND FLEX HD (ACELLULAR HYDRATED DERMIS) Bilateral 09/04/2021    Procedure: BILATERAL BREAST RECONSTRUCTION WITH PLACEMENT OF TISSUE EXPANDER AND FLEX HD (ACELLULAR HYDRATED DERMIS);  Surgeon: Pace, Collier S, MD;  Location: [facility];  Service: Plastics;  Laterality: Bilateral;   CARDIAC CATHETERIZATION         CATARACT EXTRACTION Left 10/23/2017   CATARACT EXTRACTION Right 11/06/2017   COLONOSCOPY   10/22/2009    Normal.  Repeat 5 years.  [name]   CYSTOSCOPY N/A 05/16/2017    Procedure: CYSTOSCOPY;   Surgeon: Roberts, Angela, MD;  Location: [facility];  Service: Gynecology;  Laterality: N/A;   DEBRIDEMENT AND CLOSURE WOUND Bilateral 10/03/2021    Procedure: Debridement bilateral mastectomy flaps;  Surgeon: Pace, Collier S, MD;  Location: [facility];  Service: Plastics;  Laterality: Bilateral;  1.5 hour   EYE SURGERY        bilateral cataracts   LEFT HEART CATH AND CORONARY ANGIOGRAPHY N/A 02/09/2020    Procedure: LEFT HEART CATH AND CORONARY ANGIOGRAPHY;  Surgeon: Patwardhan, Manish J, MD;  Location: [facility];  Service: Cardiovascular;  Laterality: N/A;   LEFT HEART CATH AND CORONARY ANGIOGRAPHY N/A 03/27/2022    Procedure: LEFT HEART CATH AND CORONARY ANGIOGRAPHY;  Surgeon: Ganji, Jay, MD;  [facility]: [facility];  Service: Cardiovascular;  Laterality: N/A;   MASTECTOMY W/ SENTINEL NODE BIOPSY Right 09/04/2021    Procedure: BILATERAL MASTECTOMIES WITH RIGHT SENTINEL LYMPH NODE BIOPSY;  Surgeon: Byerly, Faera, MD;  Location: [facility];  Service: General;  Laterality: Right;   REMOVAL OF BILATERAL TISSUE EXPANDERS WITH PLACEMENT OF BILATERAL BREAST IMPLANTS Bilateral 10/03/2021    Procedure: REMOVAL OF BILATERAL TISSUE EXPANDERS WITH REPLACEMENT OF TISSUE EXPANDERS;  Surgeon: Pace, Collier S, MD;  Location: [facility];  Service: Plastics;  Laterality: Bilateral;   REMOVAL OF BILATERAL TISSUE EXPANDERS WITH PLACEMENT OF BILATERAL BREAST IMPLANTS Bilateral 12/19/2021    Procedure: REMOVAL OF BILATERAL TISSUE EXPANDERS WITH PLACEMENT OF BILATERAL BREAST IMPLANTS;  Surgeon: Pace, Collier S, MD;  Location: [facility];  Service: Plastics;  Laterality: Bilateral;   ROBOTIC ASSISTED TOTAL HYSTERECTOMY N/A 05/16/2017    Procedure: ROBOTIC ASSISTED TOTAL HYSTERECTOMY;  Surgeon: Rivard, Sandra, MD;  Location: [facility];  Service: Gynecology;  Laterality: N/A;   ROTATOR CUFF REPAIR Bilateral     SHOULDER ARTHROSCOPY WITH ROTATOR CUFF REPAIR AND SUBACROMIAL DECOMPRESSION Right 09/03/2018    Procedure: Right shoulder  manipulation under anesthesia, exam under anesthesia, mini open rotator cuff repair, subacromial decompression;  Surgeon: Beane, Jeffrey, MD;  Location: [facility];  Service: Orthopedics;  Laterality: Right;  90 mins   TUBAL LIGATION       WISDOM TOOTH EXTRACTION                 Family History  Problem Relation Age of Onset   Cancer Mother 41        breast cancer   Hypertension Mother     Diabetes Father     Cancer Sister          breast cancer   Breast cancer Sister     Cancer Sister          breast cancer   Breast cancer Sister          Social History         Socioeconomic History   Marital status: Divorced      Spouse name: n/a   Number of children: 2   Years of education: 12   Highest education level: Not on file  Occupational History   Occupation: Retired      Employer: [employer]      Comment: [employer]  Tobacco Use   Smoking status: Never   Smokeless tobacco: Never  Vaping Use   Vaping Use: Never used  Substance and   Sexual Activity   Alcohol use: No   Drug use: No   Sexual activity: Not Currently      Partners: Male      Birth control/protection: Surgical, Post-menopausal      Comment: Hysterectomy  Other Topics Concern   Not on file  Social History Narrative    Marital status: divorced; dating seriously x many years.        Children:  2 children; 1 grandchild.       Employment: [employer] full-time.       Lives: alone       Tobacco: none        Alcohol: none       Exercise:  Walking with sister two days per week.       Seatbelt: 100%       Guns: none         Right-handed    Caffeine: occasional coffee, hot tea or green tea a few times per week    Social Determinants of Health    Financial Resource Strain: Not on file  Food Insecurity: Not on file  Transportation Needs: Not on file  Physical Activity: Not on file  Stress: Not on file  Social Connections: Not on file  Intimate Partner Violence: Not on file        BP 126/64   Pulse  91   Ht 5' 7" (1.702 m)   Wt 181 lb 3.2 oz (82.2 kg)   SpO2 96%   BMI 28.38 kg/m    Physical Exam:   Well appearing NAD HEENT: Unremarkable Neck:  No JVD, no thyromegally Lymphatics:  No adenopathy Back:  No CVA tenderness Lungs:  Clear with no wheezes HEART:  Regular rate rhythm, no murmurs, no rubs, no clicks Abd:  soft, positive bowel sounds, no organomegally, no rebound, no guarding Ext:  2 plus pulses, no edema, no cyanosis, no clubbing Skin:  No rashes no nodules Neuro:  CN II through XII intact, motor grossly intact   EKG - NSR with LBBB   Assess/Plan:  Chronic systolic heart failure due to non-ischemic CM - I have discussed the treatment options with the patient and the risks/benefts/goals/expectations of biv ICD vs biv PPM insertion and she will call us if she wishes to proceed.   Raquel Racey,MD 

## 2022-06-13 ENCOUNTER — Ambulatory Visit (INDEPENDENT_AMBULATORY_CARE_PROVIDER_SITE_OTHER): Payer: Medicare Other | Admitting: Surgical

## 2022-06-13 ENCOUNTER — Encounter: Payer: Self-pay | Admitting: Surgical

## 2022-06-13 ENCOUNTER — Encounter: Payer: Self-pay | Admitting: Hematology and Oncology

## 2022-06-13 DIAGNOSIS — Z9889 Other specified postprocedural states: Secondary | ICD-10-CM

## 2022-06-13 DIAGNOSIS — Z9013 Acquired absence of bilateral breasts and nipples: Secondary | ICD-10-CM | POA: Diagnosis not present

## 2022-06-13 DIAGNOSIS — D0511 Intraductal carcinoma in situ of right breast: Secondary | ICD-10-CM | POA: Diagnosis not present

## 2022-06-13 NOTE — Progress Notes (Signed)
Referring Provider [name], MD [address] [place],  [place] [address]   CC: No chief complaint on file.     Jordan Willis is an 71 y.o. female.  HPI: Patient is a 71 year old female here for follow-up on her bilateral breast cancer.  She has a history of breast cancer with subsequent reconstruction and placement of bilateral breast implants.  Subsequently they were removed on 02/05/2022 due to the right sided implant being exposed.  She does have a history of radiation bilaterally and a number of other significant medical diagnoses.  She was last seen in the office on 05/17/2022 for evaluation of opening of the left breast, has a pinhole opening that is causing some "spotting" drainage.  Today she presents with her daughter and son, Ms. Vitullo reports that she feels as if the drainage has stopped on the left side.  She is not having any infectious symptoms.    She reports that she has spoken with cardiology and she may need to have a pacemaker/defibrillator placed, she has some questions about if her skin of her upper chest will be able to tolerate this due to her history of radiation and the tightness in this area.  She feels restriction with range of motion of her upper extremities when abducting past shoulder height.  She has previously completed physical therapy for this, however this was stopped due to a wound that was draining.  Review of Systems General: No fevers or chills  Physical Exam    06/12/2022   11:58 AM 06/04/2022    9:04 AM 05/17/2022    1:46 PM  Vitals with BMI  Height '5\' 7"'$     Weight 181 lbs 3 oz 183 lbs 5 oz   BMI 16.01    Systolic 093 235 573  Diastolic 64 68 66  Pulse 91 74 81    General:  No acute distress,  Alert and oriented, Non-Toxic, Normal speech and affect Breast: Right breast incisions intact, mastectomy flaps are viable with radiation changes noted.  Mastectomy flaps are adherent to the chest wall and she has abnormal contour due  to scarring.  No subcutaneous fluid collection or erythema noted of the right breast.  Left breast incision overall intact, she does have a small pinpoint wound that is approximately 0.5 x 0.5 mm along the left lateral breast incision.  There is no surrounding erythema.  No active drainage noted.  Left breast does have scarring and radiation changes.  No subcutaneous fluid collection.  Assessment/Plan  Patient is a 71 year old female status post failed breast reconstruction due to ongoing wound complications related to radiation.  She presents today for evaluation of a pinpoint wound to the left breast, feels as if this is getting better.  Recommend continuing to monitor the area, discussed with patient due to the radiation changes that this can take some time to heal.  I discussed with the patient that I do not see any signs of concern on exam, no infection.  We discussed referral for physical therapy for improvement in her range of motion and strengthening.  We discussed that due to radiation she could develop restricted range of motion from the scarring of the mastectomy flaps to her chest wall.  Patient was understanding of this and would like to proceed with physical therapy.  I discussed with the patient that I am okay with her to proceed with therapy even with the small pinpoint wound that is present on the left breast incision.  I would like to see her back in 4 to 6 weeks for reevaluation.  She knows to call with any questions or concerns.  All of her, her daughters and her son's questions were answered to their content.  Carola Rhine [place] 06/13/2022, 10:18 AM

## 2022-06-14 ENCOUNTER — Ambulatory Visit: Payer: Medicare Other | Attending: Surgical

## 2022-06-14 DIAGNOSIS — D0511 Intraductal carcinoma in situ of right breast: Secondary | ICD-10-CM | POA: Insufficient documentation

## 2022-06-14 DIAGNOSIS — Z483 Aftercare following surgery for neoplasm: Secondary | ICD-10-CM | POA: Diagnosis not present

## 2022-06-14 DIAGNOSIS — Z9189 Other specified personal risk factors, not elsewhere classified: Secondary | ICD-10-CM | POA: Insufficient documentation

## 2022-06-14 DIAGNOSIS — Z9013 Acquired absence of bilateral breasts and nipples: Secondary | ICD-10-CM | POA: Insufficient documentation

## 2022-06-14 NOTE — Therapy (Signed)
OUTPATIENT PHYSICAL THERAPY ONCOLOGY RE-EVALUATION  Patient Name: Jordan Willis MRN: [id number] DOB:11/05/1950, 71 y.o., female Today's Date: 06/14/2022   PT End of Session - 06/14/22 0855     Visit Number 2    Number of Visits 13    Date for PT Re-Evaluation 08/09/22    PT Start Time 0900    PT Stop Time 0950    PT Time Calculation (min) 50 min    Activity Tolerance Patient tolerated treatment well    Behavior During Therapy [facility] for tasks assessed/performed             Past Medical History:  Diagnosis Date   Anemia    Anginal pain ([place])    Anxiety    Arthritis    Lumbar spine DDD.   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   Diabetes mellitus without complication (HCC)    Type II   Dyspnea    When patient gets sick uses Pro-Air inhaler   Frequent sinus infections    GERD (gastroesophageal reflux disease)    occ   Goiter    History of cardiomegaly    History of cataract    Bilateral   History of kidney stones    08/28/2018 currently has a large kidney stone, asymptomatic at this time   History of migraine    History of uterine fibroid    History of uterine prolapse    Hyperlipidemia    Hypertension    LBBB (left bundle branch block)    Menopause 03/03/2012   Nasal turbinate hypertrophy 11/29/2016   Left inferior    Personal history of chemotherapy    Personal history of radiation therapy    Pneumonia    as a child   PONV (postoperative nausea and vomiting)    Sinusitis 2014   Sleep apnea    No CPAP   SUI (stress urinary incontinence, female)    SVD (spontaneous vaginal delivery)    x 2   Thyroid nodule    Past Surgical History:  Procedure Laterality Date   ABDOMINAL HYSTERECTOMY     BILATERAL SALPINGOOPHORECTOMY Bilateral    BLADDER SUSPENSION N/A 05/16/2017    Procedure: TRANSVAGINAL TAPE (TVT) PROCEDURE;  Surgeon: Everett Graff, MD;  Location: [facility];  Service: Gynecology;  Laterality: N/A;   BREAST BIOPSY Right 2009   BREAST IMPLANT REMOVAL Bilateral 02/05/2022   Procedure: Removal bilateral breast implants;  Surgeon: [name], MD;  Location: [place];  Service: Plastics;  Laterality: Bilateral;   BREAST LUMPECTOMY Right 2009   BREAST LUMPECTOMY Left 2006   BREAST RECONSTRUCTION WITH PLACEMENT OF TISSUE EXPANDER AND FLEX HD (ACELLULAR HYDRATED DERMIS) Bilateral 09/04/2021   Procedure: BILATERAL BREAST RECONSTRUCTION WITH PLACEMENT OF TISSUE EXPANDER AND FLEX HD (ACELLULAR HYDRATED DERMIS);  Surgeon: [name], MD;  Location: [facility];  Service: Plastics;  Laterality: Bilateral;   CARDIAC CATHETERIZATION     CATARACT EXTRACTION Left 10/23/2017   CATARACT EXTRACTION Right 11/06/2017   COLONOSCOPY  10/22/2009   Normal.  Repeat 5 years.  [name]   CYSTOSCOPY N/A 05/16/2017   Procedure: CYSTOSCOPY;  Surgeon: Everett Graff, MD;  Location: [facility];  Service: Gynecology;  Laterality: N/A;   DEBRIDEMENT AND CLOSURE WOUND Bilateral 10/03/2021   Procedure: Debridement bilateral mastectomy flaps;  Surgeon: Claudia Desanctis,  Steffanie Dunn, MD;  Location: [place];  Service: Plastics;  Laterality: Bilateral;  1.5 hour   EYE SURGERY     bilateral cataracts   LEFT HEART CATH AND CORONARY ANGIOGRAPHY N/A 02/09/2020   Procedure: LEFT HEART CATH AND CORONARY ANGIOGRAPHY;  Surgeon: Nigel Mormon, MD;  Location: [facility];  Service: Cardiovascular;  Laterality: N/A;   LEFT HEART CATH AND CORONARY ANGIOGRAPHY N/A 03/27/2022   Procedure: LEFT HEART CATH AND CORONARY ANGIOGRAPHY;  Surgeon: Adrian Prows, MD;  Location: [facility];  Service: Cardiovascular;  Laterality: N/A;   MASTECTOMY W/ SENTINEL NODE BIOPSY Right 09/04/2021   Procedure: BILATERAL MASTECTOMIES WITH RIGHT SENTINEL LYMPH NODE BIOPSY;  Surgeon: Stark Klein, MD;  Location: [place];  Service:  General;  Laterality: Right;   REMOVAL OF BILATERAL TISSUE EXPANDERS WITH PLACEMENT OF BILATERAL BREAST IMPLANTS Bilateral 10/03/2021   Procedure: REMOVAL OF BILATERAL TISSUE EXPANDERS WITH REPLACEMENT OF TISSUE EXPANDERS;  Surgeon: [name], MD;  Location: [place];  Service: Plastics;  Laterality: Bilateral;   REMOVAL OF BILATERAL TISSUE EXPANDERS WITH PLACEMENT OF BILATERAL BREAST IMPLANTS Bilateral 12/19/2021   Procedure: REMOVAL OF BILATERAL TISSUE EXPANDERS WITH PLACEMENT OF BILATERAL BREAST IMPLANTS;  Surgeon: [name], MD;  Location: [place];  Service: Plastics;  Laterality: Bilateral;   ROBOTIC ASSISTED TOTAL HYSTERECTOMY N/A 05/16/2017   Procedure: ROBOTIC ASSISTED TOTAL HYSTERECTOMY;  Surgeon: Delsa Bern, MD;  Location: [facility];  Service: Gynecology;  Laterality: N/A;   ROTATOR CUFF REPAIR Bilateral    SHOULDER ARTHROSCOPY WITH ROTATOR CUFF REPAIR AND SUBACROMIAL DECOMPRESSION Right 09/03/2018   Procedure: Right shoulder manipulation under anesthesia, exam under anesthesia, mini open rotator cuff repair, subacromial decompression;  Surgeon: Susa Day, MD;  Location: [facility];  Service: Orthopedics;  Laterality: Right;  90 mins   TUBAL LIGATION     WISDOM TOOTH EXTRACTION     Patient Active Problem List   Diagnosis Date Noted   LBBB (left bundle branch block)    Chronic diastolic heart failure (HCC)    NICM (nonischemic cardiomyopathy) (HCC)    Breast cancer of upper-outer quadrant of right female breast ([place]) 09/04/2021   Diabetic neuropathy ([place]) 02/08/2021   Angina pectoris ([place]) 02/09/2020   Abnormal stress test 02/09/2020   Complete rotator cuff tear 09/03/2018   Pelvic prolapse 05/16/2017   Intractable chronic post-traumatic headache 11/19/2016   Malignant neoplasm of upper-outer quadrant of left breast in female, estrogen receptor negative ([place]) 03/03/2012   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  Pt saw MD yesterday and she has a very small area not fully healed but not draining and he wants her to continue PT.  She has not done too many exercises because of stress. She now has a heart problem, and it was suggested she should get a Investment banker, corporate. She hasn't decided about this yet. Her sister has also been very sick for the last year with brain cancer and she has been trying to care for her and she is caring for her 36 year old mother. Both shoulders feel very tight still.  PERTINENT HISTORY:  Type 2 DM, cardiomyopathy, heart failure, recurrent breast cancer s/p bil mastectomy with Rt SLNB on 09/04/21 with [name].  0/6 positive lymph nodes removed.  Bilateral removal of implants 02/05/22 due to poor wound healing. Initial breast cancer treated in 2006 with chemotherapy/Lt lumpectomy/radiation.  Rt lumpectomy in 2009 with radiation on this side.  Currently on herceptin as able.    PAIN:  Are you having pain? Yes NPRS scale: 3/10 Pain location: Across chest feels very tight Pain orientation: Bilateral  PAIN TYPE: tight, achy Pain description: intermittent and constant  Aggravating factors: using the arms Left greater than right, bending, reaching  Relieving factors: pain medication,  ibuprofen  PRECAUTIONS: lymphedema risk Rt UE, possibly Lt LUE unable to find information regarding status  WEIGHT BEARING RESTRICTIONS No  FALLS:  Has patient fallen in last 6 months? No  LIVING ENVIRONMENT: Lives with: lives alone and with 21 year old mother and my daughter is there helping Has following equipment: sometimes uses a walker  OCCUPATION: retired   LEISURE: trying to walking 96mn per day   HAND DOMINANCE : right   PRIOR LEVEL OF FUNCTION: Independent  PATIENT GOALS: reach the dishes and do yard work    OBJECTIVE  COGNITION:  Overall cognitive status: Within functional limits for tasks assessed   PALPATION:   OBSERVATIONS / OTHER ASSESSMENTS: wearing tank top with sanitary pad for drainage from the left lateral incision.  The Right side appears healed but the lateral 3inches of the left incision are open with watery drainage from wound with padding off x 162m dripping down trunk.  Surgeon is aware of status.  See photo from today.  06/14/2022 Incisions right healed, left healed except 1 very tiny area in a crease hard to visualize but without drainage  POSTURE: forward head, rounded shoulders   UPPER EXTREMITY AROM/PROM:  A/PROM RIGHT   eval  06/14/2022  Shoulder extension 30 38  Shoulder flexion 97 - pectoralis muscle 90  Shoulder abduction 72 69  Shoulder internal rotation    Shoulder external rotation      (Blank rows = not tested)  A/PROM LEFT   eval 06/14/2022  Shoulder extension 30 30  Shoulder flexion 75 -tight in upper shoulder  60  Shoulder abduction 55 47  Shoulder internal rotation    Shoulder external rotation      (Blank rows = not tested)   limited  Flexion   Extension   Right lateral flexion 25%  Left lateral flexion 25%  Right rotation 15%  Left rotation 15%     UPPER EXTREMITY STRENGTH: not tested today    LYMPHEDEMA ASSESSMENTS:   LANDMARK RIGHT  eval  10 cm proximal to olecranon process   Olecranon process   10  cm proximal to ulnar styloid process  Just proximal to ulnar styloid process   Across hand at thumb web space   At base of 2nd digit   (Blank rows = not tested)  LANDMARK LEFT  eval  10 cm proximal to olecranon process   Olecranon process   10 cm proximal to ulnar styloid process   Just proximal to ulnar styloid process   Across hand at thumb web space   At base of 2nd digit   (Blank rows = not tested)   FUNCTIONAL TESTS:  Not performed today due to EF  QUICK DASH SURVEY: 59%  TODAY'S TREATMENT  06/14/2022 Assessed AROM. Checked wound area for healing/drainage. Initiated supine clasped hands flexion and stargazer x 5, with visualization to incision and no drainage noted VC's to relax, standing wall slides, seated scapular retractions x 5, updated HEP STM supine to bilateral UT, Pecs and lats to improve relaxation and decrease tightness. Educated in importance of teaching her body how to relax again, and to work on relaxing UT   03/26/2022 Reviewed focusing on wound healing and not stretching overhead much for now and how the right side is free to stretch more Performed each exercise per below with cueing, demo, and instruction as needed  PATIENT EDUCATION:  Education details: 4 post op exs with flexion and star gazer in supine Person educated: Patient Education method: Explanation Education comprehension: verbalized understanding, returned demonstration, verbal cues required, tactile cues required, and needs further education  HOME EXERCISE PROGRAM: Access Code: [email or web address] URL: [email or web address] Date: 03/26/2022 Prepared by: Shan Levans  Exercises - Seated Diaphragmatic Breathing - 2 x daily - 7 x weekly - 10 reps - Seated Scapular Clock (1 to 7) - 2 x daily - 7 x weekly - 10 reps - Seated Shoulder Rolls - 2 x daily - 7 x weekly - 10 reps - Seated Cervical Sidebending Stretch - 2 x daily - 7 x weekly - 1-3 sets - 3 reps - 10-20 seconds hold - Sit to  Stand - 1-2 x daily - 7 x weekly - 2-10 reps - Standing Shoulder Flexion Wall Walk - 2 x daily - 7 x weekly - 1 sets - 3 reps - no hold  ASSESSMENT:  CLINICAL IMPRESSION: Patient is a 71 y.o. female who was seen today for physical therapy re-evaluation and treatment for her bilateral mastectomy care. She had had a long road of implant failure x 2 due to wound healing from DM and previous radiation.  Wound status is improved on the left lateral incision  with a very tiny area that is not fully healed but is not draining and she is ready to resume shoulder ROM exercises  The Rt side is well healed with a small visible stitch. Pt has significant bilateral shoulder ROM restrictions and demonstrates muscular tightness in bil UT,  and neck musculature Lt>Rt, decreased fuctional reach, and pain with ADLs.    OBJECTIVE IMPAIRMENTS decreased activity tolerance, decreased ROM, decreased strength, impaired UE functional use, and pain.   ACTIVITY LIMITATIONS carrying, lifting, and reach over head  PARTICIPATION LIMITATIONS: meal prep, cleaning, laundry, community activity, and yard work  [place] Age and 1-2 comorbidities: previous radiation, DM,   are also affecting patient's functional outcome.   REHAB POTENTIAL: Good  CLINICAL DECISION MAKING: Stable/uncomplicated  EVALUATION COMPLEXITY: Low  GOALS: Goals reviewed with patient? Yes  SHORT TERM GOALS:   Target date 07/12/2022   1. Pt will improve right and left shoulder flexion to 125 degrees   NEW at Re-eval  2  Pt will improve bilateral shoulder abduction to 125 degrees   NEW at Re-eval    LONG TERM GOALS: Target date: 08/09/2022  Pt will improve bil shoulder flexion AROM to at least 145 to improve reach into her cabinets Baseline:  Goal status: Ongoing  2.  Pt will decrease resting Lt UT region pain to 1/10 or less Baseline: 7/10 Goal status: Ongoing  3.  Pt will be able to return to work in her yard without  restructions Baseline:  Goal status: Ongoing  PLAN: PT FREQUENCY: 2-3x per week  PT DURATION: 8 weeks  PLANNED INTERVENTIONS: Therapeutic exercises, Patient/Family education, Manual therapy, and Re-evaluation,   PLAN FOR NEXT SESSION: Measure arm circumference,review post op exercises,  soft tissue mobilization prn, and PROM monitoring incision. Increase AAROM as able, and progress as tolerated. Check stitch on right medial incision and monitor Left incision for drainage but did very well today   Claris Pong, PT 06/14/2022, 9:54 AM

## 2022-06-14 NOTE — Addendum Note (Signed)
Addended by: Drue Novel I on: 06/14/2022 10:44 AM   Modules accepted: Orders

## 2022-06-18 ENCOUNTER — Ambulatory Visit: Payer: Medicare Other

## 2022-06-18 DIAGNOSIS — C50912 Malignant neoplasm of unspecified site of left female breast: Secondary | ICD-10-CM | POA: Diagnosis not present

## 2022-06-18 DIAGNOSIS — C50911 Malignant neoplasm of unspecified site of right female breast: Secondary | ICD-10-CM | POA: Diagnosis not present

## 2022-06-19 ENCOUNTER — Inpatient Hospital Stay (HOSPITAL_BASED_OUTPATIENT_CLINIC_OR_DEPARTMENT_OTHER): Payer: Medicare Other | Admitting: Hematology and Oncology

## 2022-06-19 DIAGNOSIS — Z171 Estrogen receptor negative status [ER-]: Secondary | ICD-10-CM

## 2022-06-19 DIAGNOSIS — C50412 Malignant neoplasm of upper-outer quadrant of left female breast: Secondary | ICD-10-CM | POA: Diagnosis not present

## 2022-06-19 DIAGNOSIS — Z79811 Long term (current) use of aromatase inhibitors: Secondary | ICD-10-CM | POA: Diagnosis not present

## 2022-06-19 DIAGNOSIS — C50912 Malignant neoplasm of unspecified site of left female breast: Secondary | ICD-10-CM | POA: Diagnosis not present

## 2022-06-19 DIAGNOSIS — C50911 Malignant neoplasm of unspecified site of right female breast: Secondary | ICD-10-CM | POA: Diagnosis not present

## 2022-06-19 MED ORDER — EXEMESTANE 25 MG PO TABS: 25.0000 mg | ORAL_TABLET | Freq: Every day | ORAL | 3 refills | Status: DC

## 2022-06-19 NOTE — Assessment & Plan Note (Signed)
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% status post lumpectomy radiation and 5 years of tamoxifen completed 04/16/2009 Recurrence: 05/23/2021: Screening mammogram detected right breast mass 10 o'clock position 1.2 cm, biopsy with grade 3 IDC ER 20%, PR 0%, HER2 positive, Ki-67 5%) Bilateral mastectomies: Right mastectomy: Grade 3 IDC with DCIS 3.6 cm, 0/6 lymph nodes negative, ER 20%, PR 20%, HER2 positive by IHC, Ki-67 50% Received 4 cycles of Herceptin stopped because of decline in ejection fraction BRCA 2 mutation ---------------------------------------------------------------------------------------------------------------------------------------------------- Non-ischemic cardiomyopathy: NYHA clas 2: follows with cardiology. Heart cath: Normal. MUGA planned Headaches: brain MRI7/10/23: No mets, chronic microvascular ischemic changes  Recommendation:Anastrozole started 05/03/22 Anastrozole toxicities: Recurrent headaches requiring ibuprofen  Recommended stopping anastrozole for 2 weeks Telephone call after that to discuss switching to exemestane.  Surveillance: She had bilateral mastectomies so there is no role of imaging  Because of her cardiac issues there is no further plan to do Herceptin anymore. Plans per Vernia Buff on 07/06/2022 for a wedding. Her daughter is her primary caregiver and she has been managing her wounds and helping her with recovery.  If necessary we will fill paperwork to enable her to get some payment from  the insurance.

## 2022-06-20 ENCOUNTER — Telehealth: Payer: Self-pay | Admitting: Hematology and Oncology

## 2022-06-20 ENCOUNTER — Ambulatory Visit: Payer: Medicare Other

## 2022-06-20 DIAGNOSIS — Z483 Aftercare following surgery for neoplasm: Secondary | ICD-10-CM

## 2022-06-20 DIAGNOSIS — Z9013 Acquired absence of bilateral breasts and nipples: Secondary | ICD-10-CM | POA: Diagnosis not present

## 2022-06-20 DIAGNOSIS — Z9189 Other specified personal risk factors, not elsewhere classified: Secondary | ICD-10-CM | POA: Diagnosis not present

## 2022-06-20 DIAGNOSIS — D0511 Intraductal carcinoma in situ of right breast: Secondary | ICD-10-CM | POA: Diagnosis not present

## 2022-06-20 NOTE — Telephone Encounter (Signed)
Scheduled appointment per 8/29 los. Left voicemail. Patient will be mailed an appointment reminder.

## 2022-06-20 NOTE — Patient Instructions (Signed)
Access Code: [email or web address] URL: [email or web address] Date: 06/20/2022 Prepared by: Cheral Almas  Exercises - Supine Lower Trunk Rotation  - 1 x daily - 7 x weekly - 1 sets - 3 reps - 10 hold - Supine Shoulder Flexion with Dowel  - 1 x daily - 7 x weekly - 1 sets - 5 reps - 10 hold

## 2022-06-20 NOTE — Therapy (Signed)
OUTPATIENT PHYSICAL THERAPY ONCOLOGY RE-EVALUATION  Patient Name: CANYON WILLOW MRN: [id number] DOB:October 29, 1950, 71 y.o., female Today's Date: 06/20/2022   PT End of Session - 06/20/22 1512     Visit Number 3    Number of Visits 13    Date for PT Re-Evaluation 08/09/22    PT Start Time 1513   late and went to bathroom   PT Stop Time 1607    PT Time Calculation (min) 54 min    Activity Tolerance Patient tolerated treatment well    Behavior During Therapy [facility] for tasks assessed/performed             Past Medical History:  Diagnosis Date   Anemia    Anginal pain ([place])    Anxiety    Arthritis    Lumbar spine DDD.   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   Diabetes mellitus without complication (HCC)    Type II   Dyspnea    When patient gets sick uses Pro-Air inhaler   Frequent sinus infections    GERD (gastroesophageal reflux disease)    occ   Goiter    History of cardiomegaly    History of cataract    Bilateral   History of kidney stones    08/28/2018 currently has a large kidney stone, asymptomatic at this time   History of migraine    History of uterine fibroid    History of uterine prolapse    Hyperlipidemia    Hypertension    LBBB (left bundle branch block)    Menopause 03/03/2012   Nasal turbinate hypertrophy 11/29/2016   Left inferior    Personal history of chemotherapy    Personal history of radiation therapy    Pneumonia    as a child   PONV (postoperative nausea and vomiting)    Sinusitis 2014   Sleep apnea    No CPAP   SUI (stress urinary incontinence, female)    SVD (spontaneous vaginal delivery)    x 2   Thyroid nodule    Past Surgical History:  Procedure Laterality Date   ABDOMINAL HYSTERECTOMY     BILATERAL SALPINGOOPHORECTOMY Bilateral    BLADDER  SUSPENSION N/A 05/16/2017   Procedure: TRANSVAGINAL TAPE (TVT) PROCEDURE;  Surgeon: Everett Graff, MD;  Location: [facility];  Service: Gynecology;  Laterality: N/A;   BREAST BIOPSY Right 2009   BREAST IMPLANT REMOVAL Bilateral 02/05/2022   Procedure: Removal bilateral breast implants;  Surgeon: [name], MD;  Location: [place];  Service: Plastics;  Laterality: Bilateral;   BREAST LUMPECTOMY Right 2009   BREAST LUMPECTOMY Left 2006   BREAST RECONSTRUCTION WITH PLACEMENT OF TISSUE EXPANDER AND FLEX HD (ACELLULAR HYDRATED DERMIS) Bilateral 09/04/2021   Procedure: BILATERAL BREAST RECONSTRUCTION WITH PLACEMENT OF TISSUE EXPANDER AND FLEX HD (ACELLULAR HYDRATED DERMIS);  Surgeon: [name], MD;  Location: [place];  Service: Plastics;  Laterality: Bilateral;   CARDIAC CATHETERIZATION     CATARACT EXTRACTION Left 10/23/2017   CATARACT EXTRACTION Right 11/06/2017   COLONOSCOPY  10/22/2009   Normal.  Repeat 5 years.  [name]   CYSTOSCOPY N/A 05/16/2017   Procedure: CYSTOSCOPY;  Surgeon: Everett Graff, MD;  Location: [facility];  Service: Gynecology;  Laterality: N/A;   DEBRIDEMENT AND CLOSURE WOUND Bilateral 10/03/2021   Procedure: Debridement  bilateral mastectomy flaps;  Surgeon: [name], MD;  Location: [place];  Service: Plastics;  Laterality: Bilateral;  1.5 hour   EYE SURGERY     bilateral cataracts   LEFT HEART CATH AND CORONARY ANGIOGRAPHY N/A 02/09/2020   Procedure: LEFT HEART CATH AND CORONARY ANGIOGRAPHY;  Surgeon: Nigel Mormon, MD;  Location: [place] [facility];  Service: Cardiovascular;  Laterality: N/A;   LEFT HEART CATH AND CORONARY ANGIOGRAPHY N/A 03/27/2022   Procedure: LEFT HEART CATH AND CORONARY ANGIOGRAPHY;  Surgeon: Adrian Prows, MD;  Location: [place] [facility];  Service: Cardiovascular;  Laterality: N/A;   MASTECTOMY W/ SENTINEL NODE BIOPSY Right 09/04/2021   Procedure: BILATERAL MASTECTOMIES WITH RIGHT SENTINEL LYMPH NODE BIOPSY;  Surgeon: Stark Klein, MD;   Location: [place];  Service: General;  Laterality: Right;   REMOVAL OF BILATERAL TISSUE EXPANDERS WITH PLACEMENT OF BILATERAL BREAST IMPLANTS Bilateral 10/03/2021   Procedure: REMOVAL OF BILATERAL TISSUE EXPANDERS WITH REPLACEMENT OF TISSUE EXPANDERS;  Surgeon: [name], MD;  Location: [place];  Service: Plastics;  Laterality: Bilateral;   REMOVAL OF BILATERAL TISSUE EXPANDERS WITH PLACEMENT OF BILATERAL BREAST IMPLANTS Bilateral 12/19/2021   Procedure: REMOVAL OF BILATERAL TISSUE EXPANDERS WITH PLACEMENT OF BILATERAL BREAST IMPLANTS;  Surgeon: [name], MD;  Location: [place];  Service: Plastics;  Laterality: Bilateral;   ROBOTIC ASSISTED TOTAL HYSTERECTOMY N/A 05/16/2017   Procedure: ROBOTIC ASSISTED TOTAL HYSTERECTOMY;  Surgeon: Delsa Bern, MD;  Location: [facility];  Service: Gynecology;  Laterality: N/A;   ROTATOR CUFF REPAIR Bilateral    SHOULDER ARTHROSCOPY WITH ROTATOR CUFF REPAIR AND SUBACROMIAL DECOMPRESSION Right 09/03/2018   Procedure: Right shoulder manipulation under anesthesia, exam under anesthesia, mini open rotator cuff repair, subacromial decompression;  Surgeon: Susa Day, MD;  Location: [facility];  Service: Orthopedics;  Laterality: Right;  90 mins   TUBAL LIGATION     WISDOM TOOTH EXTRACTION     Patient Active Problem List   Diagnosis Date Noted   LBBB (left bundle branch block)    Chronic diastolic heart failure (HCC)    NICM (nonischemic cardiomyopathy) (HCC)    Breast cancer of upper-outer quadrant of right female breast ([place]) 09/04/2021   Diabetic neuropathy ([place]) 02/08/2021   Angina pectoris ([place]) 02/09/2020   Abnormal stress test 02/09/2020   Complete rotator cuff tear 09/03/2018   Pelvic prolapse 05/16/2017   Intractable chronic post-traumatic headache 11/19/2016   Malignant neoplasm of upper-outer quadrant of left breast in female, estrogen receptor negative ([place]) 03/03/2012   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  Still no drainage from lateral incision.  I think ROM is improving. PERTINENT HISTORY:  Type 2 DM, cardiomyopathy, heart failure, recurrent breast cancer s/p bil mastectomy with Rt SLNB on 09/04/21 with [name].  0/6 positive lymph nodes removed.  Bilateral removal of implants 02/05/22 due to poor wound healing. Initial breast cancer treated in 2006 with chemotherapy/Lt lumpectomy/radiation.  Rt lumpectomy in 2009 with radiation on this side.  Currently on herceptin as able.    PAIN:  Are you having pain? Yes NPRS scale: 3/10 Pain location: Across chest feels very tight Pain orientation: Bilateral  PAIN TYPE: tight, achy Pain description: intermittent and constant  Aggravating factors: using the arms Left greater than right, bending, reaching  Relieving factors: pain medication, ibuprofen  PRECAUTIONS: lymphedema risk Rt UE, possibly Lt LUE unable to find information regarding status  WEIGHT BEARING RESTRICTIONS No  FALLS:  Has patient fallen in last 6 months? No  LIVING ENVIRONMENT: Lives with: lives alone and with 55 year old mother and my daughter is there helping Has following equipment: sometimes uses a walker  OCCUPATION: retired   LEISURE: trying to walking 18mn per day   HAND DOMINANCE : right   PRIOR  LEVEL OF FUNCTION: Independent  PATIENT GOALS: reach the dishes and do yard work    OBJECTIVE  COGNITION:  Overall cognitive status: Within functional limits for tasks assessed   PALPATION:   OBSERVATIONS / OTHER ASSESSMENTS: wearing tank top with sanitary pad for drainage from the left lateral incision.  The Right side appears healed but the lateral 3inches of the left incision are open with watery drainage from wound with padding off x 134m dripping down trunk.  Surgeon is aware of status.  See photo from today.  06/14/2022 Incisions right healed, left healed except 1 very tiny area in a crease hard to visualize but without drainage  POSTURE: forward head, rounded shoulders   UPPER EXTREMITY AROM/PROM:  A/PROM RIGHT   eval  06/14/2022  Shoulder extension 30 38  Shoulder flexion 97 - pectoralis muscle 90  Shoulder abduction 72 69  Shoulder internal rotation    Shoulder external rotation      (Blank rows = not tested)  A/PROM LEFT   eval 06/14/2022  Shoulder extension 30 30  Shoulder flexion 75 -tight in upper shoulder  60  Shoulder abduction 55 47  Shoulder internal rotation    Shoulder external rotation      (Blank rows = not tested)   limited  Flexion   Extension   Right lateral flexion 25%  Left lateral flexion 25%  Right rotation 15%  Left rotation 15%     UPPER EXTREMITY STRENGTH: not tested today    LYMPHEDEMA ASSESSMENTS:   LANDMARK RIGHT  eval  10 cm proximal to olecranon process 33.4  Olecranon process 27.9  10 cm proximal to ulnar styloid process 22.8  Just proximal to ulnar styloid process 17.3  Across hand at thumb web space 20.1  At base of 2nd digit 6.35  (Blank rows = not tested)  LANDMARK LEFT  eval  10 cm proximal to olecranon process 34.1  Olecranon process 27.4  10 cm proximal to ulnar styloid process 21.4  Just proximal to ulnar styloid process 17.55  Across hand at thumb web space 19.9  At base of 2nd digit 6.75  (Blank rows =  not tested)   FUNCTIONAL TESTS:  Not performed today due to  EF  QUICK DASH SURVEY: 59%  TODAY'S TREATMENT   06/20/2022 Measured bilateral arm circumference Performed Soft tissue mobilization to bilateral UT, pectorals and lateral trunk in supine. Pt performed supine shoulder flexion and scaption with wand, and LTR with arms outsretched x 3-5 reps ea. PROM bilateral shoulder flexion, scaption,IR and ER with VC's to relax    06/14/2022 Assessed AROM. Checked wound area for healing/drainage. Initiated supine clasped hands flexion and stargazer x 5, with visualization to incision and no drainage noted VC's to relax, standing wall slides, seated scapular retractions x 5, updated HEP STM supine to bilateral UT, Pecs and lats to improve relaxation and decrease tightness. Educated in importance of teaching her body how to relax again, and to work on relaxing UT   03/26/2022 Reviewed focusing on wound healing and not stretching overhead much for now and how the right side is free to stretch more Performed each exercise per below with cueing, demo, and instruction as needed  PATIENT EDUCATION:  Education details: Access Code: [email or web address] URL: [email or web address] Date: 06/20/2022 Prepared by: Cheral Almas  Exercises - Supine Lower Trunk Rotation  - 1 x daily - 7 x weekly - 1 sets - 3 reps - 10 hold - Supine Shoulder Flexion with Dowel  - 1 x daily - 7 x weekly - 1 sets - 5 reps - 10 hold Person educated: Patient Education method: Explanation Education comprehension: verbalized understanding, returned demonstration, verbal cues required, tactile cues required, and needs further education  HOME EXERCISE PROGRAM: Access Code: [email or web address] URL: [email or web address] Date: 06/20/2022 Prepared by: Cheral Almas  Exercises - Supine Lower Trunk Rotation  - 1 x daily - 7 x weekly - 1 sets - 3 reps - 10 hold - Supine Shoulder Flexion with Dowel  - 1 x daily - 7 x  weekly - 1 sets - 5 reps - 10 hold  Access Code: [email or web address] URL: [email or web address] Date: 03/26/2022 Prepared by: Shan Levans  Exercises - Seated Diaphragmatic Breathing - 2 x daily - 7 x weekly - 10 reps - Seated Scapular Clock (1 to 7) - 2 x daily - 7 x weekly - 10 reps - Seated Shoulder Rolls - 2 x daily - 7 x weekly - 10 reps - Seated Cervical Sidebending Stretch - 2 x daily - 7 x weekly - 1-3 sets - 3 reps - 10-20 seconds hold - Sit to Stand - 1-2 x daily - 7 x weekly - 2-10 reps - Standing Shoulder Flexion Wall Walk - 2 x daily - 7 x weekly - 1 sets - 3 reps - no hold  ASSESSMENT:  CLINICAL IMPRESSION: Wound status is improved on the left lateral incision  with a very tiny area that is not fully healed but is not draining . The Rt side is well healed with a small visible stitch still present although MD apparently removed a different stitch yesterday. Pt has been compliant with HEP and pts ROM has made good visible improvement. She is tight and tender in bilateral pectorals especially axillary border and lateral trunk. She requires occasional VC's to relax for PROM, but has done well overall. HEP was updated with LTR and supine wand in place of clasped hands flexion.       OBJECTIVE IMPAIRMENTS decreased activity tolerance, decreased ROM, decreased strength, impaired UE functional use, and pain.   ACTIVITY LIMITATIONS carrying, lifting, and reach over head  PARTICIPATION LIMITATIONS: meal prep, cleaning, laundry, community activity, and yard work  PERSONAL FACTORS Age and 1-2 comorbidities: previous  radiation, DM,   are also affecting patient's functional outcome.   REHAB POTENTIAL: Good  CLINICAL DECISION MAKING: Stable/uncomplicated  EVALUATION COMPLEXITY: Low  GOALS: Goals reviewed with patient? Yes  SHORT TERM GOALS:   Target date 07/12/2022   1. Pt will improve right and left shoulder flexion to 125 degrees   NEW at Re-eval  2  Pt will improve  bilateral shoulder abduction to 125 degrees   NEW at Re-eval    LONG TERM GOALS: Target date: 08/09/2022  Pt will improve bil shoulder flexion AROM to at least 145 to improve reach into her cabinets Baseline:  Goal status: Ongoing  2.  Pt will decrease resting Lt UT region pain to 1/10 or less Baseline: 7/10 Goal status: Ongoing  3.  Pt will be able to return to work in her yard without restructions Baseline:  Goal status: Ongoing  PLAN: PT FREQUENCY: 2-3x per week  PT DURATION: 8 weeks  PLANNED INTERVENTIONS: Therapeutic exercises, Patient/Family education, Manual therapy, and Re-evaluation,   PLAN FOR NEXT SESSION: review post op exercises,  soft tissue mobilization prn, and PROM monitoring incision. Increase AAROM as able, and progress as tolerated. Check stitch on right medial incision and monitor Left incision for drainage but did very well today, progress to strength when ready, MLD to chest prn.   Claris Pong, PT 06/20/2022, 4:11 PM

## 2022-06-24 ENCOUNTER — Other Ambulatory Visit: Payer: Self-pay | Admitting: Cardiology

## 2022-06-26 ENCOUNTER — Ambulatory Visit: Payer: Medicare Other | Attending: Surgical

## 2022-06-26 DIAGNOSIS — Z9013 Acquired absence of bilateral breasts and nipples: Secondary | ICD-10-CM | POA: Diagnosis not present

## 2022-06-26 DIAGNOSIS — Z9189 Other specified personal risk factors, not elsewhere classified: Secondary | ICD-10-CM | POA: Diagnosis not present

## 2022-06-26 DIAGNOSIS — Z483 Aftercare following surgery for neoplasm: Secondary | ICD-10-CM | POA: Diagnosis not present

## 2022-06-26 NOTE — Therapy (Signed)
OUTPATIENT PHYSICAL THERAPY ONCOLOGY RE-EVALUATION  Patient Name: Jordan Willis MRN: [id number] DOB:Mar 10, 1951, 71 y.o., female Today's Date: 06/26/2022   PT End of Session - 06/26/22 1506     Visit Number 4    Number of Visits 13    Date for PT Re-Evaluation 08/09/22    PT Start Time 1506    PT Stop Time 1556    PT Time Calculation (min) 50 min    Activity Tolerance Patient tolerated treatment well    Behavior During Therapy WFL for tasks assessed/performed             Past Medical History:  Diagnosis Date   Anemia    Anginal pain ([place])    Anxiety    Arthritis    Lumbar spine DDD.   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   Diabetes mellitus without complication (HCC)    Type II   Dyspnea    When patient gets sick uses Pro-Air inhaler   Frequent sinus infections    GERD (gastroesophageal reflux disease)    occ   Goiter    History of cardiomegaly    History of cataract    Bilateral   History of kidney stones    08/28/2018 currently has a large kidney stone, asymptomatic at this time   History of migraine    History of uterine fibroid    History of uterine prolapse    Hyperlipidemia    Hypertension    LBBB (left bundle branch block)    Menopause 03/03/2012   Nasal turbinate hypertrophy 11/29/2016   Left inferior    Personal history of chemotherapy    Personal history of radiation therapy    Pneumonia    as a child   PONV (postoperative nausea and vomiting)    Sinusitis 2014   Sleep apnea    No CPAP   SUI (stress urinary incontinence, female)    SVD (spontaneous vaginal delivery)    x 2   Thyroid nodule    Past Surgical History:  Procedure Laterality Date   ABDOMINAL HYSTERECTOMY     BILATERAL SALPINGOOPHORECTOMY Bilateral    BLADDER SUSPENSION N/A 05/16/2017    Procedure: TRANSVAGINAL TAPE (TVT) PROCEDURE;  Surgeon: Everett Graff, MD;  Location: [facility];  Service: Gynecology;  Laterality: N/A;   BREAST BIOPSY Right 2009   BREAST IMPLANT REMOVAL Bilateral 02/05/2022   Procedure: Removal bilateral breast implants;  Surgeon: [name], MD;  Location: [facility];  Service: Plastics;  Laterality: Bilateral;   BREAST LUMPECTOMY Right 2009   BREAST LUMPECTOMY Left 2006   BREAST RECONSTRUCTION WITH PLACEMENT OF TISSUE EXPANDER AND FLEX HD (ACELLULAR HYDRATED DERMIS) Bilateral 09/04/2021   Procedure: BILATERAL BREAST RECONSTRUCTION WITH PLACEMENT OF TISSUE EXPANDER AND FLEX HD (ACELLULAR HYDRATED DERMIS);  Surgeon: [name], MD;  Location: [facility];  Service: Plastics;  Laterality: Bilateral;   CARDIAC CATHETERIZATION     CATARACT EXTRACTION Left 10/23/2017   CATARACT EXTRACTION Right 11/06/2017   COLONOSCOPY  10/22/2009   Normal.  Repeat 5 years.  [name]   CYSTOSCOPY N/A 05/16/2017   Procedure: CYSTOSCOPY;  Surgeon: Everett Graff, MD;  Location: [facility];  Service: Gynecology;  Laterality: N/A;   DEBRIDEMENT AND CLOSURE WOUND Bilateral 10/03/2021   Procedure: Debridement bilateral mastectomy flaps;  Surgeon: Claudia Desanctis,  Steffanie Dunn, MD;  Location: [facility];  Service: Plastics;  Laterality: Bilateral;  1.5 hour   EYE SURGERY     bilateral cataracts   LEFT HEART CATH AND CORONARY ANGIOGRAPHY N/A 02/09/2020   Procedure: LEFT HEART CATH AND CORONARY ANGIOGRAPHY;  Surgeon: Nigel Mormon, MD;  Location: [facility];  Service: Cardiovascular;  Laterality: N/A;   LEFT HEART CATH AND CORONARY ANGIOGRAPHY N/A 03/27/2022   Procedure: LEFT HEART CATH AND CORONARY ANGIOGRAPHY;  Surgeon: Adrian Prows, MD;  Location: [facility];  Service: Cardiovascular;  Laterality: N/A;   MASTECTOMY W/ SENTINEL NODE BIOPSY Right 09/04/2021   Procedure: BILATERAL MASTECTOMIES WITH RIGHT SENTINEL LYMPH NODE BIOPSY;  Surgeon: Stark Klein, MD;  Location: [facility];  Service:  General;  Laterality: Right;   REMOVAL OF BILATERAL TISSUE EXPANDERS WITH PLACEMENT OF BILATERAL BREAST IMPLANTS Bilateral 10/03/2021   Procedure: REMOVAL OF BILATERAL TISSUE EXPANDERS WITH REPLACEMENT OF TISSUE EXPANDERS;  Surgeon: [name], MD;  Location: [facility];  Service: Plastics;  Laterality: Bilateral;   REMOVAL OF BILATERAL TISSUE EXPANDERS WITH PLACEMENT OF BILATERAL BREAST IMPLANTS Bilateral 12/19/2021   Procedure: REMOVAL OF BILATERAL TISSUE EXPANDERS WITH PLACEMENT OF BILATERAL BREAST IMPLANTS;  Surgeon: [name], MD;  Location: [facility];  Service: Plastics;  Laterality: Bilateral;   ROBOTIC ASSISTED TOTAL HYSTERECTOMY N/A 05/16/2017   Procedure: ROBOTIC ASSISTED TOTAL HYSTERECTOMY;  Surgeon: Delsa Bern, MD;  Location: [facility];  Service: Gynecology;  Laterality: N/A;   ROTATOR CUFF REPAIR Bilateral    SHOULDER ARTHROSCOPY WITH ROTATOR CUFF REPAIR AND SUBACROMIAL DECOMPRESSION Right 09/03/2018   Procedure: Right shoulder manipulation under anesthesia, exam under anesthesia, mini open rotator cuff repair, subacromial decompression;  Surgeon: Susa Day, MD;  Location: [facility];  Service: Orthopedics;  Laterality: Right;  90 mins   TUBAL LIGATION     WISDOM TOOTH EXTRACTION     Patient Active Problem List   Diagnosis Date Noted   LBBB (left bundle branch block)    Chronic diastolic heart failure (HCC)    NICM (nonischemic cardiomyopathy) (HCC)    Breast cancer of upper-outer quadrant of right female breast ([place]) 09/04/2021   Diabetic neuropathy ([place]) 02/08/2021   Angina pectoris ([place]) 02/09/2020   Abnormal stress test 02/09/2020   Complete rotator cuff tear 09/03/2018   Pelvic prolapse 05/16/2017   Intractable chronic post-traumatic headache 11/19/2016   Malignant neoplasm of upper-outer quadrant of left breast in female, estrogen receptor negative ([place]) 03/03/2012   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  I was on the yoga mat doing the exercises and my daughter thought she would help and pushed my arm down and that really hurt.   It calmed down afterwards. Did fine after last visit. I can reach better for dishes, able to put a dress over my head with a little trouble. I was tender under my arm. I put a little healing ointment where the scar is and I haven't seen any drainage.   PERTINENT HISTORY:  Type 2 DM, cardiomyopathy, heart failure, recurrent breast cancer s/p bil mastectomy with Rt SLNB on 09/04/21 with [name].  0/6 positive lymph nodes removed.  Bilateral removal of implants 02/05/22 due to poor wound healing. Initial breast cancer treated in 2006 with chemotherapy/Lt lumpectomy/radiation.  Rt lumpectomy in 2009 with radiation on this side.  Currently on herceptin as able.    PAIN:  Are you having pain? No, mainly tightness NPRS scale: 0/10 Pain location: Across chest feels very tight Pain orientation: Bilateral  PAIN TYPE: tight, achy Pain description: intermittent and constant  Aggravating factors: using the arms Left greater than right, bending, reaching  Relieving factors: pain medication, ibuprofen  PRECAUTIONS: lymphedema risk Rt UE, possibly Lt LUE unable to find information regarding status  WEIGHT BEARING RESTRICTIONS No  FALLS:   Has patient fallen in last 6 months? No  LIVING ENVIRONMENT: Lives with: lives alone and with 104 year old mother and my daughter is there helping Has following equipment: sometimes uses a walker  OCCUPATION: retired   LEISURE: trying to walking 44mn per day   HAND DOMINANCE : right   PRIOR LEVEL OF FUNCTION: Independent  PATIENT GOALS: reach the dishes and do yard work    OBJECTIVE  COGNITION:  Overall cognitive status: Within functional limits for tasks assessed   PALPATION:   OBSERVATIONS / OTHER ASSESSMENTS: wearing tank top with sanitary pad for drainage from the left lateral incision.  The Right side appears healed but the lateral 3inches of the left incision are open with watery drainage from wound with padding off x 140m dripping down trunk.  Surgeon is aware of status.  See photo from today.  06/14/2022 Incisions right healed, left healed except 1 very tiny area in a crease hard to visualize but without drainage  POSTURE: forward head, rounded shoulders   UPPER EXTREMITY AROM/PROM:  A/PROM RIGHT   eval  06/14/2022  Shoulder extension 30 38  Shoulder flexion 97 - pectoralis muscle 90  Shoulder abduction 72 69  Shoulder internal rotation    Shoulder external rotation      (Blank rows = not tested)  A/PROM LEFT   eval 06/14/2022  Shoulder extension 30 30  Shoulder flexion 75 -tight in upper shoulder  60  Shoulder abduction 55 47  Shoulder internal rotation    Shoulder external rotation      (Blank rows = not tested)   limited  Flexion   Extension   Right lateral flexion 25%  Left lateral flexion 25%  Right rotation 15%  Left rotation 15%     UPPER EXTREMITY STRENGTH: not tested today    LYMPHEDEMA ASSESSMENTS:   LANDMARK RIGHT  eval  10 cm proximal to olecranon process 33.4  Olecranon process 27.9  10 cm proximal to ulnar styloid process 22.8  Just proximal to ulnar styloid process 17.3  Across hand at thumb web space 20.1  At base of 2nd  digit 6.35  (Blank rows =  not tested)  LANDMARK LEFT  eval  10 cm proximal to olecranon process 34.1  Olecranon process 27.4  10 cm proximal to ulnar styloid process 21.4  Just proximal to ulnar styloid process 17.55  Across hand at thumb web space 19.9  At base of 2nd digit 6.75  (Blank rows = not tested)   FUNCTIONAL TESTS:  Not performed today due to EF  QUICK DASH SURVEY: 59%  TODAY'S TREATMENT  06/26/2022  Performed Soft tissue mobilization to bilateral UT, pectorals and lateral trunk in supine, and right scapular area in left SL Pt performed  AROM supine shoulder flexion and scaption  and horizontal abd. x5, and LTR with arms outsretched x 3 reps ea. PROM bilateral shoulder flexion, scaption,IR and ER with VC's to relax  06/20/2022 Measured bilateral arm circumference Performed Soft tissue mobilization to bilateral UT, pectorals and lateral trunk in supine,  Pt performed supine shoulder flexion and scaption with wand, and LTR with arms outsretched x 3-5 reps ea. PROM bilateral shoulder flexion, scaption,IR and ER with VC's to relax    06/14/2022 Assessed AROM. Checked wound area for healing/drainage. Initiated supine clasped hands flexion and stargazer x 5, with visualization to incision and no drainage noted VC's to relax, standing wall slides, seated scapular retractions x 5, updated HEP STM supine to bilateral UT, Pecs and lats to improve relaxation and decrease tightness. Educated in importance of teaching her body how to relax again, and to work on relaxing UT   03/26/2022 Reviewed focusing on wound healing and not stretching overhead much for now and how the right side is free to stretch more Performed each exercise per below with cueing, demo, and instruction as needed  PATIENT EDUCATION:  Education details: Access Code: [email or web address] URL: [email or web address] Date: 06/20/2022 Prepared by: Cheral Almas  Exercises - Supine Lower Trunk Rotation  - 1  x daily - 7 x weekly - 1 sets - 3 reps - 10 hold - Supine Shoulder Flexion with Dowel  - 1 x daily - 7 x weekly - 1 sets - 5 reps - 10 hold Person educated: Patient Education method: Explanation Education comprehension: verbalized understanding, returned demonstration, verbal cues required, tactile cues required, and needs further education  HOME EXERCISE PROGRAM: Access Code: [email or web address] URL: [email or web address] Date: 06/20/2022 Prepared by: Cheral Almas  Exercises - Supine Lower Trunk Rotation  - 1 x daily - 7 x weekly - 1 sets - 3 reps - 10 hold - Supine Shoulder Flexion with Dowel  - 1 x daily - 7 x weekly - 1 sets - 5 reps - 10 hold  Access Code: [email or web address] URL: [email or web address] Date: 03/26/2022 Prepared by: Shan Levans  Exercises - Seated Diaphragmatic Breathing - 2 x daily - 7 x weekly - 10 reps - Seated Scapular Clock (1 to 7) - 2 x daily - 7 x weekly - 10 reps - Seated Shoulder Rolls - 2 x daily - 7 x weekly - 10 reps - Seated Cervical Sidebending Stretch - 2 x daily - 7 x weekly - 1-3 sets - 3 reps - 10-20 seconds hold - Sit to Stand - 1-2 x daily - 7 x weekly - 2-10 reps - Standing Shoulder Flexion Wall Walk - 2 x daily - 7 x weekly - 1 sets - 3 reps - no hold  ASSESSMENT:  CLINICAL IMPRESSION: Wound area on left remains the same;no drainage but small hole still present. Pt is compliant with HEP and is making gains with ROM. Will measure next visit.  Being fit for prophyactic sleeve tomorrow.       OBJECTIVE IMPAIRMENTS decreased activity tolerance, decreased ROM, decreased strength, impaired UE functional use, and pain.   ACTIVITY LIMITATIONS carrying, lifting, and reach over head  PARTICIPATION LIMITATIONS: meal prep, cleaning, laundry, community activity, and yard work  [place] Age and 1-2 comorbidities: previous radiation, DM,   are also affecting patient's functional outcome.   REHAB POTENTIAL: Good  CLINICAL  DECISION MAKING: Stable/uncomplicated  EVALUATION COMPLEXITY: Low  GOALS: Goals reviewed with patient? Yes  SHORT TERM GOALS:   Target date 07/12/2022   1. Pt will improve right and left shoulder flexion to 125 degrees   NEW at Re-eval  2  Pt will improve bilateral shoulder abduction to 125 degrees   NEW at Re-eval    LONG TERM GOALS: Target date: 08/09/2022  Pt will improve bil shoulder flexion AROM to at least 145 to improve reach into her cabinets Baseline:  Goal status: Ongoing  2.  Pt will decrease resting Lt UT region pain to 1/10 or less Baseline: 7/10 Goal status: Ongoing  3.  Pt will be able to return to work in her yard without restructions Baseline:  Goal status: Ongoing  PLAN: PT FREQUENCY: 2-3x per week  PT DURATION: 8 weeks  PLANNED INTERVENTIONS: Therapeutic exercises, Patient/Family education, Manual therapy, and Re-evaluation,   PLAN FOR NEXT SESSION:  Measure,review post op exercises,  soft tissue mobilization prn, and PROM monitoring incision. Increase AAROM as able, and progress as tolerated. Check stitch on right medial incision and monitor Left incision for drainage but did very well today, progress to strength when ready, MLD to chest prn. Sent demo and script to [facility] 06/26/2022   Claris Pong, PT 06/26/2022, 3:59 PM

## 2022-06-27 DIAGNOSIS — C50912 Malignant neoplasm of unspecified site of left female breast: Secondary | ICD-10-CM | POA: Diagnosis not present

## 2022-06-28 ENCOUNTER — Ambulatory Visit: Payer: Medicare Other

## 2022-06-28 DIAGNOSIS — Z9013 Acquired absence of bilateral breasts and nipples: Secondary | ICD-10-CM

## 2022-06-28 DIAGNOSIS — Z483 Aftercare following surgery for neoplasm: Secondary | ICD-10-CM

## 2022-06-28 DIAGNOSIS — Z9189 Other specified personal risk factors, not elsewhere classified: Secondary | ICD-10-CM | POA: Diagnosis not present

## 2022-06-28 NOTE — Therapy (Signed)
OUTPATIENT PHYSICAL THERAPY ONCOLOGY RE-EVALUATION  Patient Name: Jordan Willis MRN: [id number] DOB:1950-10-30, 71 y.o., female Today's Date: 06/28/2022   PT End of Session - 06/28/22 1406     Visit Number 5    Number of Visits 13    Date for PT Re-Evaluation 08/09/22    PT Start Time 1408    PT Stop Time 1456    PT Time Calculation (min) 48 min    Activity Tolerance Patient tolerated treatment well    Behavior During Therapy WFL for tasks assessed/performed             Past Medical History:  Diagnosis Date   Anemia    Anginal pain ([place])    Anxiety    Arthritis    Lumbar spine DDD.   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   Diabetes mellitus without complication (HCC)    Type II   Dyspnea    When patient gets sick uses Pro-Air inhaler   Frequent sinus infections    GERD (gastroesophageal reflux disease)    occ   Goiter    History of cardiomegaly    History of cataract    Bilateral   History of kidney stones    08/28/2018 currently has a large kidney stone, asymptomatic at this time   History of migraine    History of uterine fibroid    History of uterine prolapse    Hyperlipidemia    Hypertension    LBBB (left bundle branch block)    Menopause 03/03/2012   Nasal turbinate hypertrophy 11/29/2016   Left inferior    Personal history of chemotherapy    Personal history of radiation therapy    Pneumonia    as a child   PONV (postoperative nausea and vomiting)    Sinusitis 2014   Sleep apnea    No CPAP   SUI (stress urinary incontinence, female)    SVD (spontaneous vaginal delivery)    x 2   Thyroid nodule    Past Surgical History:  Procedure Laterality Date   ABDOMINAL HYSTERECTOMY     BILATERAL SALPINGOOPHORECTOMY Bilateral    BLADDER SUSPENSION N/A 05/16/2017    Procedure: TRANSVAGINAL TAPE (TVT) PROCEDURE;  Surgeon: Everett Graff, MD;  Location: [facility];  Service: Gynecology;  Laterality: N/A;   BREAST BIOPSY Right 2009   BREAST IMPLANT REMOVAL Bilateral 02/05/2022   Procedure: Removal bilateral breast implants;  Surgeon: [name], MD;  Location: [facility];  Service: Plastics;  Laterality: Bilateral;   BREAST LUMPECTOMY Right 2009   BREAST LUMPECTOMY Left 2006   BREAST RECONSTRUCTION WITH PLACEMENT OF TISSUE EXPANDER AND FLEX HD (ACELLULAR HYDRATED DERMIS) Bilateral 09/04/2021   Procedure: BILATERAL BREAST RECONSTRUCTION WITH PLACEMENT OF TISSUE EXPANDER AND FLEX HD (ACELLULAR HYDRATED DERMIS);  Surgeon: [name], MD;  Location: [facility];  Service: Plastics;  Laterality: Bilateral;   CARDIAC CATHETERIZATION     CATARACT EXTRACTION Left 10/23/2017   CATARACT EXTRACTION Right 11/06/2017   COLONOSCOPY  10/22/2009   Normal.  Repeat 5 years.  [name]   CYSTOSCOPY N/A 05/16/2017   Procedure: CYSTOSCOPY;  Surgeon: Everett Graff, MD;  Location: [facility];  Service: Gynecology;  Laterality: N/A;   DEBRIDEMENT AND CLOSURE WOUND Bilateral 10/03/2021   Procedure: Debridement bilateral mastectomy flaps;  Surgeon: Claudia Desanctis,  Steffanie Dunn, MD;  Location: [facility];  Service: Plastics;  Laterality: Bilateral;  1.5 hour   EYE SURGERY     bilateral cataracts   LEFT HEART CATH AND CORONARY ANGIOGRAPHY N/A 02/09/2020   Procedure: LEFT HEART CATH AND CORONARY ANGIOGRAPHY;  Surgeon: Nigel Mormon, MD;  Location: [facility];  Service: Cardiovascular;  Laterality: N/A;   LEFT HEART CATH AND CORONARY ANGIOGRAPHY N/A 03/27/2022   Procedure: LEFT HEART CATH AND CORONARY ANGIOGRAPHY;  Surgeon: Adrian Prows, MD;  Location: [facility];  Service: Cardiovascular;  Laterality: N/A;   MASTECTOMY W/ SENTINEL NODE BIOPSY Right 09/04/2021   Procedure: BILATERAL MASTECTOMIES WITH RIGHT SENTINEL LYMPH NODE BIOPSY;  Surgeon: Stark Klein, MD;  Location: [facility];  Service:  General;  Laterality: Right;   REMOVAL OF BILATERAL TISSUE EXPANDERS WITH PLACEMENT OF BILATERAL BREAST IMPLANTS Bilateral 10/03/2021   Procedure: REMOVAL OF BILATERAL TISSUE EXPANDERS WITH REPLACEMENT OF TISSUE EXPANDERS;  Surgeon: [name], MD;  Location: [facility];  Service: Plastics;  Laterality: Bilateral;   REMOVAL OF BILATERAL TISSUE EXPANDERS WITH PLACEMENT OF BILATERAL BREAST IMPLANTS Bilateral 12/19/2021   Procedure: REMOVAL OF BILATERAL TISSUE EXPANDERS WITH PLACEMENT OF BILATERAL BREAST IMPLANTS;  Surgeon: [name], MD;  Location: [facility];  Service: Plastics;  Laterality: Bilateral;   ROBOTIC ASSISTED TOTAL HYSTERECTOMY N/A 05/16/2017   Procedure: ROBOTIC ASSISTED TOTAL HYSTERECTOMY;  Surgeon: Delsa Bern, MD;  Location: [facility];  Service: Gynecology;  Laterality: N/A;   ROTATOR CUFF REPAIR Bilateral    SHOULDER ARTHROSCOPY WITH ROTATOR CUFF REPAIR AND SUBACROMIAL DECOMPRESSION Right 09/03/2018   Procedure: Right shoulder manipulation under anesthesia, exam under anesthesia, mini open rotator cuff repair, subacromial decompression;  Surgeon: Susa Day, MD;  Location: [facility];  Service: Orthopedics;  Laterality: Right;  90 mins   TUBAL LIGATION     WISDOM TOOTH EXTRACTION     Patient Active Problem List   Diagnosis Date Noted   LBBB (left bundle branch block)    Chronic diastolic heart failure (HCC)    NICM (nonischemic cardiomyopathy) (HCC)    Breast cancer of upper-outer quadrant of right female breast ([place]) 09/04/2021   Diabetic neuropathy ([place]) 02/08/2021   Angina pectoris ([place]) 02/09/2020   Abnormal stress test 02/09/2020   Complete rotator cuff tear 09/03/2018   Pelvic prolapse 05/16/2017   Intractable chronic post-traumatic headache 11/19/2016   Malignant neoplasm of upper-outer quadrant of left breast in female, estrogen receptor negative ([place]) 03/03/2012   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 I am doing better reaching to cabinets, and making my bed. My chest doesn't feel as tight now. I used to feel like I had something binding all the time.    PERTINENT HISTORY:  Type 2 DM, cardiomyopathy, heart failure, recurrent breast cancer s/p bil mastectomy with Rt SLNB on 09/04/21 with [name].  0/6 positive lymph nodes removed.  Bilateral removal of implants 02/05/22 due to poor wound healing. Initial breast cancer treated in 2006 with chemotherapy/Lt lumpectomy/radiation.  Rt lumpectomy in 2009 with radiation on this side.  Currently on herceptin as able.    PAIN:  Are you having pain? No, mainly tightness NPRS scale: 0/10 Pain location: Across chest feels very tight Pain orientation: Bilateral  PAIN TYPE: tight, achy Pain description: intermittent and constant  Aggravating factors: using the arms Left greater than right, bending, reaching  Relieving factors: pain medication, ibuprofen  PRECAUTIONS: lymphedema risk Rt UE, possibly Lt LUE unable to find information regarding status  WEIGHT BEARING RESTRICTIONS No  FALLS:  Has patient fallen in last 6 months? No  LIVING ENVIRONMENT: Lives with: lives alone and with 71 year old mother and my daughter is there helping Has following equipment: sometimes  uses a walker  OCCUPATION: retired   LEISURE: trying to walking 58mn per day   HAND DOMINANCE : right   PRIOR LEVEL OF FUNCTION: Independent  PATIENT GOALS: reach the dishes and do yard work    OBJECTIVE  COGNITION:  Overall cognitive status: Within functional limits for tasks assessed   PALPATION:   OBSERVATIONS / OTHER ASSESSMENTS: wearing tank top with sanitary pad for drainage from the left lateral incision.  The Right side appears healed but the lateral 3inches of the left incision are open with watery drainage from wound with padding off x 148m dripping down trunk.  Surgeon is aware of status.  See photo from today.  06/14/2022 Incisions right healed, left healed except 1 very tiny area in a crease hard to visualize but without drainage  POSTURE: forward head, rounded shoulders   UPPER EXTREMITY AROM/PROM:  A/PROM RIGHT   eval  06/14/2022 06/28/2022  Shoulder extension 30 38 52  Shoulder flexion 97 - pectoralis muscle 90 130  Shoulder abduction 72 69 137  Shoulder internal rotation     Shoulder external rotation       (Blank rows = not tested)  A/PROM LEFT   eval 06/14/2022 06/28/2022  Shoulder extension 30 30 46  Shoulder flexion 75 -tight in upper shoulder  60 130  Shoulder abduction 55 47 111  Shoulder internal rotation     Shoulder external rotation       (Blank rows = not tested)   limited  Flexion   Extension   Right lateral flexion 25%  Left lateral flexion 25%  Right rotation 15%  Left rotation 15%     UPPER EXTREMITY STRENGTH: not tested today    LYMPHEDEMA ASSESSMENTS:   LANDMARK RIGHT  eval  10 cm proximal to olecranon process 33.4  Olecranon process 27.9  10 cm proximal to ulnar styloid process 22.8  Just proximal to ulnar styloid process 17.3  Across hand at thumb web space 20.1  At base of 2nd digit 6.35  (Blank rows = not tested)  LANDMARK LEFT  eval  10 cm proximal to olecranon process 34.1  Olecranon process 27.4  10 cm  proximal to ulnar styloid process 21.4  Just proximal to ulnar styloid process 17.55  Across hand at thumb web space 19.9  At base of 2nd digit 6.75  (Blank rows = not tested)   FUNCTIONAL TESTS:  Not performed today due to EF  QUICK DASH SURVEY: 59%  TODAY'S TREATMENT   06/28/2022 Overhead pulleys for flexion 2 min, scaption and abduction 1 min ea, demonstrated by PT and performed by pt with occasional VC's, Performed Soft tissue mobilization to bilateral UT, pectorals and lateral trunk in supine,  Pt performed  AAROM supine  wand shoulder flexion and scaption  x 5,  PROM bilateral shoulder flexion, scaption,IR and ER with VC's to relax  Measured ROM    06/26/2022  Performed Soft tissue mobilization to bilateral UT, pectorals and lateral trunk in supine, and right scapular area in left SL Pt performed  AROM supine shoulder flexion and scaption  and horizontal abd. x5, and LTR with arms outsretched x 3 reps ea. PROM bilateral shoulder flexion, scaption,IR and ER with VC's to relax  06/20/2022 Measured bilateral arm circumference Performed Soft tissue mobilization to bilateral UT, pectorals and lateral trunk in supine,  Pt performed supine shoulder flexion and scaption with wand, and LTR with arms outsretched x 3-5 reps ea. PROM bilateral shoulder flexion, scaption,IR and ER with VC's to relax     PATIENT EDUCATION:  Education details: Access Code: [email or web address] URL: [email or web address] Date: 06/20/2022 Prepared by: Cheral Almas  Exercises - Supine Lower Trunk Rotation  - 1 x daily - 7 x weekly - 1 sets - 3 reps - 10 hold - Supine Shoulder Flexion with Dowel  - 1 x daily - 7 x weekly - 1 sets - 5 reps - 10 hold Person educated: Patient Education method: Explanation Education comprehension: verbalized understanding, returned demonstration, verbal cues required, tactile cues required, and needs further education  HOME EXERCISE PROGRAM: Access Code:  [email or web address] URL: [email or web address] Date: 06/20/2022 Prepared by: Cheral Almas  Exercises - Supine Lower Trunk Rotation  - 1 x daily - 7 x weekly - 1 sets - 3 reps - 10 hold - Supine Shoulder Flexion with Dowel  - 1 x daily - 7 x weekly - 1 sets - 5 reps - 10 hold  Access Code: [email or web address] URL: [email or web address] Date: 03/26/2022 Prepared by: Shan Levans  Exercises - Seated Diaphragmatic Breathing - 2 x daily - 7 x weekly - 10 reps - Seated Scapular Clock (1 to 7) - 2 x daily - 7 x weekly - 10 reps - Seated Shoulder Rolls - 2 x daily - 7 x weekly - 10 reps - Seated Cervical Sidebending Stretch - 2 x daily - 7 x weekly - 1-3 sets - 3 reps - 10-20 seconds hold - Sit to Stand - 1-2 x daily - 7 x weekly - 2-10 reps - Standing Shoulder Flexion Wall Walk - 2 x daily - 7 x weekly - 1 sets - 3 reps - no hold  ASSESSMENT:  CLINICAL IMPRESSION: Pt achieved STG #1 for shoulder flexion 130 degrees. Making excellent progress with improved function noted. Pt works very hard at home and in therapy.     OBJECTIVE IMPAIRMENTS decreased activity tolerance, decreased ROM, decreased strength, impaired UE functional use, and pain.   ACTIVITY LIMITATIONS carrying, lifting, and reach over head  PARTICIPATION LIMITATIONS: meal prep, cleaning, laundry, community activity, and yard work  Secor Age and 1-2 comorbidities: previous radiation, DM,   are also affecting patient's functional outcome.  REHAB POTENTIAL: Good  CLINICAL DECISION MAKING: Stable/uncomplicated  EVALUATION COMPLEXITY: Low  GOALS: Goals reviewed with patient? Yes  SHORT TERM GOALS:   Target date 07/12/2022   1. Pt will improve right and left shoulder flexion to 125 degrees  MET  06/28/2022  2  Pt will improve bilateral shoulder abduction to 125 degrees   NEW at Re-eval    LONG TERM GOALS: Target date: 08/09/2022  Pt will improve bil shoulder flexion AROM to at least 145 to improve  reach into her cabinets Baseline:  Goal status: Ongoing  2.  Pt will decrease resting Lt UT region pain to 1/10 or less Baseline: 7/10 Goal status: Ongoing  3.  Pt will be able to return to work in her yard without restructions Baseline:  Goal status: Ongoing  PLAN: PT FREQUENCY: 2-3x per week  PT DURATION: 8 weeks  PLANNED INTERVENTIONS: Therapeutic exercises, Patient/Family education, Manual therapy, and Re-evaluation,   PLAN FOR NEXT SESSION:  pulleys,ball soft tissue mobilization prn, and PROM monitoring incision. Increase AAROM as able, and progress as tolerated. Check stitch on right medial incision and monitor Left incision for drainage but did very well today, progress to strength when ready, MLD to chest prn. Sent demo and script to [facility] 06/26/2022   Claris Pong, PT 06/28/2022, 2:58 PM

## 2022-07-02 ENCOUNTER — Ambulatory Visit: Payer: Medicare Other

## 2022-07-02 ENCOUNTER — Encounter: Payer: Self-pay | Admitting: *Deleted

## 2022-07-02 DIAGNOSIS — Z9013 Acquired absence of bilateral breasts and nipples: Secondary | ICD-10-CM | POA: Diagnosis not present

## 2022-07-02 DIAGNOSIS — Z9189 Other specified personal risk factors, not elsewhere classified: Secondary | ICD-10-CM | POA: Diagnosis not present

## 2022-07-02 DIAGNOSIS — Z483 Aftercare following surgery for neoplasm: Secondary | ICD-10-CM | POA: Diagnosis not present

## 2022-07-02 NOTE — Progress Notes (Signed)
Pt dropped off [employer] Request Form for Disabled Adults.  Pt states she has trouble preforming ADL's due to decreased ROM with bilateral mastectomies.  Pt states this is a service that is offered through her insurance company.  Form filled out and signed by MD.  RN successfully faxed form with attached office notes to [place] [phone]).  Pt also requesting copy of paperwork be mailed to address on file.

## 2022-07-02 NOTE — Therapy (Addendum)
OUTPATIENT PHYSICAL THERAPY ONCOLOGY RE-EVALUATION  Patient Name: Jordan Willis MRN: [id number] DOB:May 19, 1951, 71 y.o., female Today's Date: 07/02/2022   PT End of Session - 07/02/22 0805     Visit Number 6   late   Number of Visits 13    Date for PT Re-Evaluation 08/09/22    PT Start Time 0807    PT Stop Time 0855    PT Time Calculation (min) 48 min    Activity Tolerance Patient tolerated treatment well    Behavior During Therapy [facility] for tasks assessed/performed             Past Medical History:  Diagnosis Date   Anemia    Anginal pain ([place])    Anxiety    Arthritis    Lumbar spine DDD.   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   Diabetes mellitus without complication (HCC)    Type II   Dyspnea    When patient gets sick uses Pro-Air inhaler   Frequent sinus infections    GERD (gastroesophageal reflux disease)    occ   Goiter    History of cardiomegaly    History of cataract    Bilateral   History of kidney stones    08/28/2018 currently has a large kidney stone, asymptomatic at this time   History of migraine    History of uterine fibroid    History of uterine prolapse    Hyperlipidemia    Hypertension    LBBB (left bundle branch block)    Menopause 03/03/2012   Nasal turbinate hypertrophy 11/29/2016   Left inferior    Personal history of chemotherapy    Personal history of radiation therapy    Pneumonia    as a child   PONV (postoperative nausea and vomiting)    Sinusitis 2014   Sleep apnea    No CPAP   SUI (stress urinary incontinence, female)    SVD (spontaneous vaginal delivery)    x 2   Thyroid nodule    Past Surgical History:  Procedure Laterality Date   ABDOMINAL HYSTERECTOMY     BILATERAL SALPINGOOPHORECTOMY Bilateral    BLADDER SUSPENSION N/A 05/16/2017    Procedure: TRANSVAGINAL TAPE (TVT) PROCEDURE;  Surgeon: Everett Graff, MD;  Location: [facility];  Service: Gynecology;  Laterality: N/A;   BREAST BIOPSY Right 2009   BREAST IMPLANT REMOVAL Bilateral 02/05/2022   Procedure: Removal bilateral breast implants;  Surgeon: [name], MD;  Location: [place];  Service: Plastics;  Laterality: Bilateral;   BREAST LUMPECTOMY Right 2009   BREAST LUMPECTOMY Left 2006   BREAST RECONSTRUCTION WITH PLACEMENT OF TISSUE EXPANDER AND FLEX HD (ACELLULAR HYDRATED DERMIS) Bilateral 09/04/2021   Procedure: BILATERAL BREAST RECONSTRUCTION WITH PLACEMENT OF TISSUE EXPANDER AND FLEX HD (ACELLULAR HYDRATED DERMIS);  Surgeon: [name], MD;  Location: [place];  Service: Plastics;  Laterality: Bilateral;   CARDIAC CATHETERIZATION     CATARACT EXTRACTION Left 10/23/2017   CATARACT EXTRACTION Right 11/06/2017   COLONOSCOPY  10/22/2009   Normal.  Repeat 5 years.  [name]   CYSTOSCOPY N/A 05/16/2017   Procedure: CYSTOSCOPY;  Surgeon: Everett Graff, MD;  Location: [facility];  Service: Gynecology;  Laterality: N/A;   DEBRIDEMENT AND CLOSURE WOUND Bilateral 10/03/2021   Procedure: Debridement bilateral mastectomy flaps;  Surgeon: [name], MD;  Location: [place];  Service: Plastics;  Laterality: Bilateral;  1.5 hour   EYE SURGERY     bilateral cataracts   LEFT HEART CATH AND CORONARY ANGIOGRAPHY N/A 02/09/2020   Procedure: LEFT HEART CATH AND CORONARY ANGIOGRAPHY;  Surgeon: Nigel Mormon, MD;  Location: [facility];  Service: Cardiovascular;  Laterality: N/A;   LEFT HEART CATH AND CORONARY ANGIOGRAPHY N/A 03/27/2022   Procedure: LEFT HEART CATH AND CORONARY ANGIOGRAPHY;  Surgeon: Adrian Prows, MD;  Location: [facility];  Service: Cardiovascular;  Laterality: N/A;   MASTECTOMY W/ SENTINEL NODE BIOPSY Right 09/04/2021   Procedure: BILATERAL MASTECTOMIES WITH RIGHT SENTINEL LYMPH NODE BIOPSY;  Surgeon: Stark Klein, MD;  Location: [place];  Service:  General;  Laterality: Right;   REMOVAL OF BILATERAL TISSUE EXPANDERS WITH PLACEMENT OF BILATERAL BREAST IMPLANTS Bilateral 10/03/2021   Procedure: REMOVAL OF BILATERAL TISSUE EXPANDERS WITH REPLACEMENT OF TISSUE EXPANDERS;  Surgeon: [name], MD;  Location: [place];  Service: Plastics;  Laterality: Bilateral;   REMOVAL OF BILATERAL TISSUE EXPANDERS WITH PLACEMENT OF BILATERAL BREAST IMPLANTS Bilateral 12/19/2021   Procedure: REMOVAL OF BILATERAL TISSUE EXPANDERS WITH PLACEMENT OF BILATERAL BREAST IMPLANTS;  Surgeon: [name], MD;  Location: [place];  Service: Plastics;  Laterality: Bilateral;   ROBOTIC ASSISTED TOTAL HYSTERECTOMY N/A 05/16/2017   Procedure: ROBOTIC ASSISTED TOTAL HYSTERECTOMY;  Surgeon: Delsa Bern, MD;  Location: [facility];  Service: Gynecology;  Laterality: N/A;   ROTATOR CUFF REPAIR Bilateral    SHOULDER ARTHROSCOPY WITH ROTATOR CUFF REPAIR AND SUBACROMIAL DECOMPRESSION Right 09/03/2018   Procedure: Right shoulder manipulation under anesthesia, exam under anesthesia, mini open rotator cuff repair, subacromial decompression;  Surgeon: Susa Day, MD;  Location: [facility];  Service: Orthopedics;  Laterality: Right;  90 mins   TUBAL LIGATION     WISDOM TOOTH EXTRACTION     Patient Active Problem List   Diagnosis Date Noted   LBBB (left bundle branch block)    Chronic diastolic heart failure (HCC)    NICM (nonischemic cardiomyopathy) (HCC)    Breast cancer of upper-outer quadrant of right female breast ([place]) 09/04/2021   Diabetic neuropathy ([place]) 02/08/2021   Angina pectoris ([place]) 02/09/2020   Abnormal stress test 02/09/2020   Complete rotator cuff tear 09/03/2018   Pelvic prolapse 05/16/2017   Intractable chronic post-traumatic headache 11/19/2016   Malignant neoplasm of upper-outer quadrant of left breast in female, estrogen receptor negative ([place]) 03/03/2012   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   PERTINENT HISTORY:  Type 2 DM, cardiomyopathy, heart failure, recurrent breast cancer s/p bil mastectomy with Rt SLNB on 09/04/21 with [name].  0/6 positive lymph nodes removed.  Bilateral removal of implants 02/05/22 due to poor wound healing. Initial breast cancer treated in 2006 with chemotherapy/Lt lumpectomy/radiation.  Rt lumpectomy in 2009 with radiation on this side.  Currently on herceptin as able.    PAIN:  Are you having pain? No, mainly tightness NPRS scale: 0/10 Pain location: Across chest feels very tight Pain orientation: Bilateral  PAIN TYPE: tight, achy Pain description: intermittent and constant  Aggravating factors: using the arms Left greater than right, bending, reaching  Relieving factors: pain medication, ibuprofen  PRECAUTIONS: lymphedema risk Rt UE, possibly Lt LUE unable to find information regarding status  WEIGHT BEARING RESTRICTIONS No  FALLS:  Has patient fallen in last 6 months? No  LIVING ENVIRONMENT: Lives with: lives alone and with 46 year old mother and my daughter is there helping Has following equipment: sometimes uses a walker  OCCUPATION: retired   LEISURE: trying to walking 41mn per day   HAND DOMINANCE : right   PRIOR LEVEL OF FUNCTION: Independent  PATIENT GOALS: reach the  dishes and do yard work    OBJECTIVE  COGNITION:  Overall cognitive status: Within functional limits for tasks assessed   PALPATION:   OBSERVATIONS / OTHER ASSESSMENTS: wearing tank top with sanitary pad for drainage from the left lateral incision.  The Right side appears healed but the lateral 3inches of the left incision are open with watery drainage from wound with padding off x 176m dripping down trunk.  Surgeon is aware of status.  See photo from today.  06/14/2022 Incisions right healed, left healed except 1 very tiny area in a crease hard to visualize but without drainage  POSTURE: forward head, rounded shoulders   UPPER EXTREMITY AROM/PROM:  A/PROM RIGHT   eval  06/14/2022 06/28/2022  Shoulder extension 30 38 52  Shoulder flexion 97 - pectoralis muscle 90 130  Shoulder abduction 72 69 137  Shoulder internal rotation     Shoulder external rotation       (Blank rows = not tested)  A/PROM LEFT   eval 06/14/2022 06/28/2022  Shoulder extension 30 30 46  Shoulder flexion 75 -tight in upper shoulder  60 130  Shoulder abduction 55 47 111  Shoulder internal rotation     Shoulder external rotation       (Blank rows = not tested)   limited  Flexion   Extension   Right lateral flexion 25%  Left lateral flexion 25%  Right rotation 15%  Left rotation 15%     UPPER EXTREMITY STRENGTH: not tested today    LYMPHEDEMA ASSESSMENTS:   LANDMARK RIGHT  eval  10 cm proximal to olecranon process 33.4  Olecranon process 27.9  10 cm proximal to ulnar styloid process 22.8  Just proximal to ulnar styloid process 17.3  Across hand at thumb web space 20.1  At base of 2nd digit 6.35  (Blank rows = not tested)  LANDMARK LEFT  eval  10 cm proximal to olecranon process 34.1  Olecranon process 27.4  10 cm proximal to ulnar styloid process 21.4  Just proximal to ulnar styloid process 17.55  Across hand at thumb web space 19.9  At base of 2nd digit 6.75  (Blank rows = not  tested)   FUNCTIONAL  TESTS:  Not performed today due to EF  QUICK DASH SURVEY: 59%  TODAY'S TREATMENT  07/02/2022 Overhead pulleys for flexion 2 min, scaption and abduction 1:30 min ea, demonstrated by PT and performed by pt with occasional VC's, Performed Soft tissue mobilization to bilateral UT, pectorals and lateral trunk in supine,  Pt performed  AAROM supine  wand shoulder flexion and scaption  x 5,  PROM bilateral shoulder flexion, scaption,IR and ER with VC's to relax  Supine ER and horizontal abduction with yellow band x 5  06/28/2022 Overhead pulleys for flexion 2 min, scaption and abduction 1 min ea, demonstrated by PT and performed by pt with occasional VC's, Performed Soft tissue mobilization to bilateral UT, pectorals and lateral trunk in supine,  Pt performed  AAROM supine  wand shoulder flexion and scaption  x 5,  PROM bilateral shoulder flexion, scaption,IR and ER with VC's to relax  Measured ROM    06/26/2022  Performed Soft tissue mobilization to bilateral UT, pectorals and lateral trunk in supine, and right scapular area in left SL Pt performed  AROM supine shoulder flexion and scaption  and horizontal abd. x5, and LTR with arms outsretched x 3 reps ea. PROM bilateral shoulder flexion, scaption,IR and ER with VC's to relax  06/20/2022 Measured bilateral arm circumference Performed Soft tissue mobilization to bilateral UT, pectorals and lateral trunk in supine,  Pt performed supine shoulder flexion and scaption with wand, and LTR with arms outsretched x 3-5 reps ea. PROM bilateral shoulder flexion, scaption,IR and ER with VC's to relax     PATIENT EDUCATION:  Education details: Access Code: [email or web address] URL: [email or web address] Date: 06/20/2022 Prepared by: Cheral Almas  Exercises - Supine Lower Trunk Rotation  - 1 x daily - 7 x weekly - 1 sets - 3 reps - 10 hold - Supine Shoulder Flexion with Dowel  - 1 x daily - 7 x weekly - 1 sets - 5 reps -  10 hold Person educated: Patient Education method: Explanation Education comprehension: verbalized understanding, returned demonstration, verbal cues required, tactile cues required, and needs further education  HOME EXERCISE PROGRAM: Access Code: [email or web address] URL: [email or web address] Date: 06/20/2022 Prepared by: Cheral Almas  Exercises - Supine Lower Trunk Rotation  - 1 x daily - 7 x weekly - 1 sets - 3 reps - 10 hold - Supine Shoulder Flexion with Dowel  - 1 x daily - 7 x weekly - 1 sets - 5 reps - 10 hold  Access Code: [email or web address] URL: [email or web address] Date: 03/26/2022 Prepared by: Shan Levans  Exercises - Seated Diaphragmatic Breathing - 2 x daily - 7 x weekly - 10 reps - Seated Scapular Clock (1 to 7) - 2 x daily - 7 x weekly - 10 reps - Seated Shoulder Rolls - 2 x daily - 7 x weekly - 10 reps - Seated Cervical Sidebending Stretch - 2 x daily - 7 x weekly - 1-3 sets - 3 reps - 10-20 seconds hold - Sit to Stand - 1-2 x daily - 7 x weekly - 2-10 reps - Standing Shoulder Flexion Wall Walk - 2 x daily - 7 x weekly - 1 sets - 3 reps - no hold  ASSESSMENT:  CLINICAL IMPRESSION:  Pt had increased pain after last visit for unknown reasons. Doing very well with pulleys and demonstrating good form and ROM. Pt relaxed well for PROM. Pt needs review of Tband exs so not given for HEP.  OBJECTIVE IMPAIRMENTS decreased activity tolerance, decreased ROM, decreased strength, impaired UE  functional use, and pain.   ACTIVITY LIMITATIONS carrying, lifting, and reach over head  PARTICIPATION LIMITATIONS: meal prep, cleaning, laundry, community activity, and yard work  [place] Age and 1-2 comorbidities: previous radiation, DM,   are also affecting patient's functional outcome.   REHAB POTENTIAL: Good  CLINICAL DECISION MAKING: Stable/uncomplicated  EVALUATION COMPLEXITY: Low  GOALS: Goals reviewed with patient? Yes  SHORT TERM GOALS:   Target  date 07/12/2022   1. Pt will improve right and left shoulder flexion to 125 degrees  MET  06/28/2022  2  Pt will improve bilateral shoulder abduction to 125 degrees   NEW at Re-eval    LONG TERM GOALS: Target date: 08/09/2022  Pt will improve bil shoulder flexion AROM to at least 145 to improve reach into her cabinets Baseline:  Goal status: Ongoing  2.  Pt will decrease resting Lt UT region pain to 1/10 or less Baseline: 7/10 Goal status: Ongoing  3.  Pt will be able to return to work in her yard without restructions Baseline:  Goal status: Ongoing  PLAN: PT FREQUENCY: 2-3x per week  PT DURATION: 8 weeks  PLANNED INTERVENTIONS: Therapeutic exercises, Patient/Family education, Manual therapy, and Re-evaluation,   PLAN FOR NEXT SESSION:  pulleys,ball soft tissue mobilization prn, and PROM monitoring incision. Increase AAROM as able, and progress as tolerated. Check stitch on right medial incision and monitor Left incision for drainage but did very well today, progress to strength when ready, MLD to chest prn. Review supine scapular stabs and update.   Claris Pong, PT 07/02/2022, 12:03 PM

## 2022-07-04 ENCOUNTER — Ambulatory Visit: Payer: Medicare Other | Admitting: Rehabilitation

## 2022-07-04 ENCOUNTER — Encounter: Payer: Self-pay | Admitting: Rehabilitation

## 2022-07-04 DIAGNOSIS — Z9013 Acquired absence of bilateral breasts and nipples: Secondary | ICD-10-CM | POA: Diagnosis not present

## 2022-07-04 DIAGNOSIS — Z9189 Other specified personal risk factors, not elsewhere classified: Secondary | ICD-10-CM

## 2022-07-04 DIAGNOSIS — Z483 Aftercare following surgery for neoplasm: Secondary | ICD-10-CM | POA: Diagnosis not present

## 2022-07-04 NOTE — Therapy (Signed)
OUTPATIENT PHYSICAL THERAPY ONCOLOGY TREATMENT  Patient Name: Jordan Willis MRN: [id number] DOB:22-Dec-1950, 71 y.o., female Today's Date: 07/04/2022   PT End of Session - 07/04/22 0808     Visit Number 7    Number of Visits 13    Date for PT Re-Evaluation 08/09/22    PT Start Time 0810    PT Stop Time 0855    PT Time Calculation (min) 45 min    Activity Tolerance Patient tolerated treatment well    Behavior During Therapy [facility] for tasks assessed/performed             Past Medical History:  Diagnosis Date   Anemia    Anginal pain ([place])    Anxiety    Arthritis    Lumbar spine DDD.   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   Diabetes mellitus without complication (HCC)    Type II   Dyspnea    When patient gets sick uses Pro-Air inhaler   Frequent sinus infections    GERD (gastroesophageal reflux disease)    occ   Goiter    History of cardiomegaly    History of cataract    Bilateral   History of kidney stones    08/28/2018 currently has a large kidney stone, asymptomatic at this time   History of migraine    History of uterine fibroid    History of uterine prolapse    Hyperlipidemia    Hypertension    LBBB (left bundle branch block)    Menopause 03/03/2012   Nasal turbinate hypertrophy 11/29/2016   Left inferior    Personal history of chemotherapy    Personal history of radiation therapy    Pneumonia    as a child   PONV (postoperative nausea and vomiting)    Sinusitis 2014   Sleep apnea    No CPAP   SUI (stress urinary incontinence, female)    SVD (spontaneous vaginal delivery)    x 2   Thyroid nodule    Past Surgical History:  Procedure Laterality Date   ABDOMINAL HYSTERECTOMY     BILATERAL SALPINGOOPHORECTOMY Bilateral    BLADDER SUSPENSION N/A 05/16/2017   Procedure:  TRANSVAGINAL TAPE (TVT) PROCEDURE;  Surgeon: Everett Graff, MD;  Location: [place] [facility];  Service: Gynecology;  Laterality: N/A;   BREAST BIOPSY Right 2009   BREAST IMPLANT REMOVAL Bilateral 02/05/2022   Procedure: Removal bilateral breast implants;  Surgeon: [name], MD;  Location: [place];  Service: Plastics;  Laterality: Bilateral;   BREAST LUMPECTOMY Right 2009   BREAST LUMPECTOMY Left 2006   BREAST RECONSTRUCTION WITH PLACEMENT OF TISSUE EXPANDER AND FLEX HD (ACELLULAR HYDRATED DERMIS) Bilateral 09/04/2021   Procedure: BILATERAL BREAST RECONSTRUCTION WITH PLACEMENT OF TISSUE EXPANDER AND FLEX HD (ACELLULAR HYDRATED DERMIS);  Surgeon: [name], MD;  Location: [place];  Service: Plastics;  Laterality: Bilateral;   CARDIAC CATHETERIZATION     CATARACT EXTRACTION Left 10/23/2017   CATARACT EXTRACTION Right 11/06/2017   COLONOSCOPY  10/22/2009   Normal.  Repeat 5 years.  [name]   CYSTOSCOPY N/A 05/16/2017   Procedure: CYSTOSCOPY;  Surgeon: Everett Graff, MD;  Location: [place] [facility];  Service: Gynecology;  Laterality: N/A;   DEBRIDEMENT AND CLOSURE WOUND Bilateral 10/03/2021   Procedure: Debridement bilateral mastectomy flaps;  Surgeon: Claudia Desanctis,  Steffanie Dunn, MD;  Location: [place];  Service: Plastics;  Laterality: Bilateral;  1.5 hour   EYE SURGERY     bilateral cataracts   LEFT HEART CATH AND CORONARY ANGIOGRAPHY N/A 02/09/2020   Procedure: LEFT HEART CATH AND CORONARY ANGIOGRAPHY;  Surgeon: Nigel Mormon, MD;  Location: [place] [facility];  Service: Cardiovascular;  Laterality: N/A;   LEFT HEART CATH AND CORONARY ANGIOGRAPHY N/A 03/27/2022   Procedure: LEFT HEART CATH AND CORONARY ANGIOGRAPHY;  Surgeon: Adrian Prows, MD;  Location: [place] [facility];  Service: Cardiovascular;  Laterality: N/A;   MASTECTOMY W/ SENTINEL NODE BIOPSY Right 09/04/2021   Procedure: BILATERAL MASTECTOMIES WITH RIGHT SENTINEL LYMPH NODE BIOPSY;  Surgeon: Stark Klein, MD;  Location: [place];  Service: General;   Laterality: Right;   REMOVAL OF BILATERAL TISSUE EXPANDERS WITH PLACEMENT OF BILATERAL BREAST IMPLANTS Bilateral 10/03/2021   Procedure: REMOVAL OF BILATERAL TISSUE EXPANDERS WITH REPLACEMENT OF TISSUE EXPANDERS;  Surgeon: [name], MD;  Location: [place];  Service: Plastics;  Laterality: Bilateral;   REMOVAL OF BILATERAL TISSUE EXPANDERS WITH PLACEMENT OF BILATERAL BREAST IMPLANTS Bilateral 12/19/2021   Procedure: REMOVAL OF BILATERAL TISSUE EXPANDERS WITH PLACEMENT OF BILATERAL BREAST IMPLANTS;  Surgeon: [name], MD;  Location: [place];  Service: Plastics;  Laterality: Bilateral;   ROBOTIC ASSISTED TOTAL HYSTERECTOMY N/A 05/16/2017   Procedure: ROBOTIC ASSISTED TOTAL HYSTERECTOMY;  Surgeon: Delsa Bern, MD;  Location: [place] [facility];  Service: Gynecology;  Laterality: N/A;   ROTATOR CUFF REPAIR Bilateral    SHOULDER ARTHROSCOPY WITH ROTATOR CUFF REPAIR AND SUBACROMIAL DECOMPRESSION Right 09/03/2018   Procedure: Right shoulder manipulation under anesthesia, exam under anesthesia, mini open rotator cuff repair, subacromial decompression;  Surgeon: Susa Day, MD;  Location: [facility];  Service: Orthopedics;  Laterality: Right;  90 mins   TUBAL LIGATION     WISDOM TOOTH EXTRACTION     Patient Active Problem List   Diagnosis Date Noted   LBBB (left bundle branch block)    Chronic diastolic heart failure (HCC)    NICM (nonischemic cardiomyopathy) (HCC)    Breast cancer of upper-outer quadrant of right female breast ([place]) 09/04/2021   Diabetic neuropathy ([place]) 02/08/2021   Angina pectoris ([place]) 02/09/2020   Abnormal stress test 02/09/2020   Complete rotator cuff tear 09/03/2018   Pelvic prolapse 05/16/2017   Intractable chronic post-traumatic headache 11/19/2016   Malignant neoplasm of upper-outer quadrant of left breast in female, estrogen receptor negative ([place]) 03/03/2012   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   PERTINENT HISTORY:  Type 2 DM, cardiomyopathy, heart failure, recurrent breast cancer s/p bil mastectomy with Rt SLNB on 09/04/21 with [name].  0/6 positive lymph nodes removed.  Bilateral removal of implants 02/05/22 due to poor wound healing. Initial breast cancer treated in 2006 with chemotherapy/Lt lumpectomy/radiation.  Rt lumpectomy in 2009 with radiation on this side.  Currently on herceptin as able.    PAIN:  Are you having pain? No, mainly tightness NPRS scale: 0/10 Pain location: Across chest feels very tight Pain orientation: Bilateral  PAIN TYPE: tight, achy Pain description: intermittent and constant  Aggravating factors: using the arms Left greater than right, bending, reaching  Relieving factors: pain medication, ibuprofen  PRECAUTIONS: lymphedema risk Rt UE, possibly Lt LUE unable to find information regarding status  WEIGHT BEARING RESTRICTIONS No  FALLS:  Has patient fallen in last 6 months? No  LIVING ENVIRONMENT: Lives with: lives alone and with 15 year old mother and my daughter is there helping Has following equipment: sometimes uses a walker  OCCUPATION: retired   LEISURE: trying to walking 30mn per day   HAND DOMINANCE : right   PRIOR LEVEL OF FUNCTION: Independent  PATIENT GOALS: reach the dishes and do yard work     OBJECTIVE  COGNITION:  Overall cognitive status: Within functional limits for tasks assessed   PALPATION:   OBSERVATIONS / OTHER ASSESSMENTS: wearing tank top with sanitary pad for drainage from the left lateral incision.  The Right side appears healed but the lateral 3inches of the left incision are open with watery drainage from wound with padding off x 122m dripping down trunk.  Surgeon is aware of status.  See photo from today.  06/14/2022 Incisions right healed, left healed except 1 very tiny area in a crease hard to visualize but without drainage  POSTURE: forward head, rounded shoulders   UPPER EXTREMITY AROM/PROM:  A/PROM RIGHT   eval  06/14/2022 06/28/2022  Shoulder extension 30 38 52  Shoulder flexion 97 - pectoralis muscle 90 130  Shoulder abduction 72 69 137  Shoulder internal rotation     Shoulder external rotation       (Blank rows = not tested)  A/PROM LEFT   eval 06/14/2022 06/28/2022  Shoulder extension 30 30 46  Shoulder flexion 75 -tight in upper shoulder  60 130  Shoulder abduction 55 47 111  Shoulder internal rotation     Shoulder external rotation       (Blank rows = not tested)   limited  Flexion   Extension   Right lateral flexion 25%  Left lateral flexion 25%  Right rotation 15%  Left rotation 15%     UPPER EXTREMITY STRENGTH: not tested today    LYMPHEDEMA ASSESSMENTS:   LANDMARK RIGHT  eval  10 cm proximal to olecranon process 33.4  Olecranon process 27.9  10 cm proximal to ulnar styloid process 22.8  Just proximal to ulnar styloid process 17.3  Across hand at thumb web space 20.1  At base of 2nd digit 6.35  (Blank rows = not tested)  LANDMARK LEFT  eval  10 cm proximal to olecranon process 34.1  Olecranon process 27.4  10 cm proximal to ulnar styloid process 21.4  Just proximal to ulnar styloid process 17.55  Across hand at thumb web space 19.9  At base of 2nd digit 6.75  (Blank rows = not tested)   FUNCTIONAL TESTS:   Not performed today due to EF  QUICK DASH  SURVEY: 59%  TODAY'S TREATMENT  07/04/2022 Overhead pulleys for flexion and abduction 2 min each Performed Soft tissue mobilization to bilateral UT, pectorals and lateral trunk in supine,  Pt performed  AAROM supine  wand shoulder x 10 LTR with goalpost arms x 5bil PROM bilateral shoulder flexion, scaption,IR and ER with VC's to relax Supine ER and horizontal abduction with yellow band x 5 with moderate vcs needed for performance   07/02/2022 Overhead pulleys for flexion 2 min, scaption and abduction 1:30 min ea, demonstrated by PT and performed by pt with occasional VC's, Performed Soft tissue mobilization to bilateral UT, pectorals and lateral trunk in supine,  Pt performed  AAROM supine  wand shoulder flexion and scaption  x 5,  PROM bilateral shoulder flexion, scaption,IR and ER with VC's to relax  Supine ER and horizontal abduction with yellow band x 5  06/28/2022 Overhead pulleys for flexion 2 min, scaption and abduction 1 min ea, demonstrated by PT and performed by pt with occasional VC's, Performed Soft tissue mobilization to bilateral UT, pectorals and lateral trunk in supine,  Pt performed  AAROM supine  wand shoulder flexion and scaption  x 5,  PROM bilateral shoulder flexion, scaption,IR and ER with VC's to relax  Measured ROM  PATIENT EDUCATION:  Education details: Access Code: [email or web address] URL: [email or web address] Date: 06/20/2022 Prepared by: Cheral Almas  Exercises - Supine Lower Trunk Rotation  - 1 x daily - 7 x weekly - 1 sets - 3 reps - 10 hold - Supine Shoulder Flexion with Dowel  - 1 x daily - 7 x weekly - 1 sets - 5 reps - 10 hold Exercises - Supine Lower Trunk Rotation  - 1 x daily - 7 x weekly - 1 sets - 3 reps - 10 hold - Supine Shoulder Flexion with Dowel  - 1 x daily - 7 x weekly - 1 sets - 5 reps - 10 hold  Access Code: [email or web address] URL: [email or web address] Date: 03/26/2022 Prepared  by: Shan Levans  Exercises - Seated Diaphragmatic Breathing - 2 x daily - 7 x weekly - 10 reps - Seated Scapular Clock (1 to 7) - 2 x daily - 7 x weekly - 10 reps - Seated Shoulder Rolls - 2 x daily - 7 x weekly - 10 reps - Seated Cervical Sidebending Stretch - 2 x daily - 7 x weekly - 1-3 sets - 3 reps - 10-20 seconds hold - Sit to Stand - 1-2 x daily - 7 x weekly - 2-10 reps - Standing Shoulder Flexion Wall Walk - 2 x daily - 7 x weekly - 1 sets - 3 reps - no hold  ASSESSMENT: CLINICAL IMPRESSION: Pt tolerating all well.  Still with overall tightness and pectoralis tightness especially overhead.    OBJECTIVE IMPAIRMENTS decreased activity tolerance, decreased ROM, decreased strength, impaired UE functional use, and pain.   ACTIVITY LIMITATIONS carrying, lifting, and reach over head  PARTICIPATION LIMITATIONS: meal prep, cleaning, laundry, community activity, and yard work  [place] Age and 1-2 comorbidities: previous radiation, DM,   are also affecting patient's functional outcome.   REHAB POTENTIAL: Good  CLINICAL DECISION MAKING: Stable/uncomplicated  EVALUATION COMPLEXITY: Low  GOALS: Goals reviewed with patient? Yes  SHORT TERM GOALS:   Target date 07/12/2022   1. Pt will improve right and left shoulder flexion to 125 degrees  MET  06/28/2022  2  Pt will improve bilateral shoulder abduction to 125 degrees   NEW at Re-eval    LONG TERM  GOALS: Target date: 08/09/2022  Pt will improve bil shoulder flexion AROM to at least 145 to improve reach into her cabinets Baseline:  Goal status: Ongoing  2.  Pt will decrease resting Lt UT region pain to 1/10 or less Baseline: 7/10 Goal status: Ongoing  3.  Pt will be able to return to work in her yard without restructions Baseline:  Goal status: Ongoing  PLAN: PT FREQUENCY: 2-3x per week  PT DURATION: 8 weeks  PLANNED INTERVENTIONS: Therapeutic exercises, Patient/Family education, Manual therapy, and  Re-evaluation,   PLAN FOR NEXT SESSION:  pulleys,ball soft tissue mobilization prn, and PROM monitoring incision. Increase AAROM as able, and progress as tolerated. Check stitch on right medial incision and monitor Left incision for drainage but did very well today, progress to strength when ready, MLD to chest prn. Review supine scapular stabs and update.   Stark Bray, PT 07/04/2022, 8:55 AM

## 2022-07-11 ENCOUNTER — Telehealth: Payer: Self-pay

## 2022-07-11 ENCOUNTER — Ambulatory Visit: Payer: Medicare Other | Admitting: Rehabilitation

## 2022-07-11 NOTE — Telephone Encounter (Signed)
Yes ST

## 2022-07-11 NOTE — Telephone Encounter (Signed)
A refill request came through for NITROGLYCERIN for patient, but this medication is not on file. OK to refill? Please advise.

## 2022-07-13 ENCOUNTER — Ambulatory Visit: Payer: Medicare Other | Admitting: Physical Therapy

## 2022-07-16 ENCOUNTER — Ambulatory Visit: Payer: Medicare Other

## 2022-07-18 ENCOUNTER — Ambulatory Visit: Payer: Medicare Other

## 2022-07-19 ENCOUNTER — Other Ambulatory Visit: Payer: Self-pay | Admitting: Gastroenterology

## 2022-07-21 ENCOUNTER — Other Ambulatory Visit: Payer: Self-pay | Admitting: Cardiology

## 2022-07-22 ENCOUNTER — Emergency Department (HOSPITAL_COMMUNITY)
Admission: EM | Admit: 2022-07-22 | Discharge: 2022-07-22 | Disposition: A | Discharge status: Home / Self Care | Payer: Medicare Other | Attending: Emergency Medicine | Admitting: Emergency Medicine

## 2022-07-22 ENCOUNTER — Encounter (HOSPITAL_COMMUNITY): Payer: Self-pay | Admitting: Emergency Medicine

## 2022-07-22 ENCOUNTER — Emergency Department (HOSPITAL_COMMUNITY): Payer: Medicare Other

## 2022-07-22 DIAGNOSIS — R5383 Other fatigue: Secondary | ICD-10-CM | POA: Diagnosis not present

## 2022-07-22 DIAGNOSIS — J101 Influenza due to other identified influenza virus with other respiratory manifestations: Secondary | ICD-10-CM | POA: Insufficient documentation

## 2022-07-22 DIAGNOSIS — R059 Cough, unspecified: Secondary | ICD-10-CM | POA: Diagnosis not present

## 2022-07-22 DIAGNOSIS — N200 Calculus of kidney: Secondary | ICD-10-CM | POA: Diagnosis not present

## 2022-07-22 DIAGNOSIS — Z20822 Contact with and (suspected) exposure to covid-19: Secondary | ICD-10-CM | POA: Insufficient documentation

## 2022-07-22 DIAGNOSIS — J111 Influenza due to unidentified influenza virus with other respiratory manifestations: Secondary | ICD-10-CM

## 2022-07-22 DIAGNOSIS — R509 Fever, unspecified: Secondary | ICD-10-CM | POA: Diagnosis present

## 2022-07-22 DIAGNOSIS — R079 Chest pain, unspecified: Secondary | ICD-10-CM | POA: Diagnosis not present

## 2022-07-22 LAB — RESP PANEL BY RT-PCR (FLU A&B, COVID) ARPGX2
Influenza A by PCR: POSITIVE — AB
Influenza B by PCR: NEGATIVE
SARS Coronavirus 2 by RT PCR: NEGATIVE

## 2022-07-22 MED ORDER — ONDANSETRON HCL 4 MG PO TABS: 4.0000 mg | ORAL_TABLET | Freq: Four times a day (QID) | ORAL | 0 refills | Status: AC | PRN

## 2022-07-22 MED ORDER — OSELTAMIVIR PHOSPHATE 75 MG PO CAPS
75.0000 mg | ORAL_CAPSULE | Freq: Once | ORAL | Status: AC
  Administered 2022-07-22: 75 mg via ORAL
  Filled 2022-07-22: qty 1

## 2022-07-22 MED ORDER — OSELTAMIVIR PHOSPHATE 75 MG PO CAPS: 75.0000 mg | ORAL_CAPSULE | Freq: Two times a day (BID) | ORAL | 0 refills | Status: DC

## 2022-07-22 NOTE — ED Triage Notes (Signed)
Pt here from home with c/o chest pain non radiating along with a cough ,congestion  and wants a flu test due to her mom having the flu

## 2022-07-22 NOTE — ED Provider Triage Note (Signed)
Emergency Medicine Provider Triage Evaluation Note  Jordan Willis , a 71 y.o. female  was evaluated in triage.  Pt complains of flu like sx for several days. Pt's mother currently hospitalized with flu. Pt started having nasal congestion, cough, central chest pain (worse with coughing), and diarrhea. Four episodes of diarrhea today. On schedule to have heart surgery with pacemaker/defibrillator this month or next.   Review of Systems  Positive: As above Negative: Fever, N/V, abd pain  Physical Exam  BP 132/71 (BP Location: Left Arm)   Pulse 100   Temp 98 F (36.7 C) (Oral)   Resp 16   SpO2 100%  Gen:   Awake, no distress   Resp:  Normal effort  MSK:   Moves extremities without difficulty  Other:    Medical Decision Making  Medically screening exam initiated at 4:04 PM.  Appropriate orders placed.  Jordan Willis was informed that the remainder of the evaluation will be completed by another provider, this initial triage assessment does not replace that evaluation, and the importance of remaining in the ED until their evaluation is complete.  Workup initiated   Jordan Willis T, PA-C 07/22/22 1606

## 2022-07-22 NOTE — ED Provider Notes (Signed)
[place] DEPT Provider Note   CSN: [id number] Arrival date & time: 07/22/22  1536     History No chief complaint on file.   HPI Jordan Willis is a 71 y.o. female presenting for fever, cough, congestion.  She states that she is worried that she has a pneumonia versus the flu.  Patient states that she was recently stuck in [place] because her mother was diagnosed with flu and a bowel obstruction and she was in very close proximity late with her mother throughout the duration of this hospital illness.  She states that she she has been sick for 2 days but was only just able to get home with her mother and wanted to come to the emergency department after her mother had been admitted.   Fevers and chills earlier today, nausea with vomiting as well as diarrhea.  Muscle aches diffusely including into her chest.  Patient's recorded medical, surgical, social, medication list and allergies were reviewed in the Snapshot window as part of the initial history.   Review of Systems   Review of Systems  Constitutional:  Positive for chills and fever.  HENT:  Positive for congestion. Negative for ear pain and sore throat.   Eyes:  Negative for pain and visual disturbance.  Respiratory:  Positive for cough. Negative for shortness of breath.   Cardiovascular:  Negative for chest pain and palpitations.  Gastrointestinal:  Positive for diarrhea. Negative for abdominal pain and vomiting.  Genitourinary:  Negative for dysuria and hematuria.  Musculoskeletal:  Negative for arthralgias and back pain.  Skin:  Negative for color change and rash.  Neurological:  Negative for seizures and syncope.  All other systems reviewed and are negative.   Physical Exam Updated Vital Signs BP 137/73 (BP Location: Right Arm)   Pulse 85   Temp 98.2 F (36.8 C) (Oral)   Resp 18   SpO2 98%  Physical Exam Vitals and nursing note reviewed.  Constitutional:      General: She is not in  acute distress.    Appearance: She is well-developed.  HENT:     Head: Normocephalic and atraumatic.  Eyes:     Conjunctiva/sclera: Conjunctivae normal.  Cardiovascular:     Rate and Rhythm: Normal rate and regular rhythm.     Heart sounds: No murmur heard. Pulmonary:     Effort: Pulmonary effort is normal. No respiratory distress.     Breath sounds: Normal breath sounds.  Abdominal:     Palpations: Abdomen is soft.     Tenderness: There is no abdominal tenderness.  Musculoskeletal:        General: No swelling.     Cervical back: Neck supple.  Skin:    General: Skin is warm and dry.     Capillary Refill: Capillary refill takes less than 2 seconds.  Neurological:     Mental Status: She is alert.  Psychiatric:        Mood and Affect: Mood normal.      ED Course/ Medical Decision Making/ A&P    Procedures Procedures   Medications Ordered in ED Medications  oseltamivir (TAMIFLU) capsule 75 mg (has no administration in time range)    Medical Decision Making:    Jordan Willis is a 71 y.o. female who presented to the ED today with multiple concerns detailed above.     Patient's presentation is complicated by their history of complex comorbid medical history..  Patient placed on continuous vitals and telemetry monitoring  while in ED which was reviewed periodically.   Complete initial physical exam performed, notably the patient  was hemodynamically stable in no acute distress.      Reviewed and confirmed nursing documentation for past medical history, family history, social history.    Initial Assessment:   Patient's history of present illness and physical exam findings are most consistent with likely influenza infection given the diffuse nature of symptoms and multiple systems involved in her acute illness as well as recent exposure.  This is confirmed on a influenza test at this time. However, patient has substantial risk factors for systemic, cardiac, pulmonary disease.   She was initially tachycardic at 100 on arrival though in no acute distress.  Shared medical decision making with patient regarding depth of work-up.  I recommended troponin testing, blood work, possible CT imaging if abnormalities on blood work.  However patient states that she would like to get back to the care of her mother as soon as possible.  She stated that she thinks it is just the flu and would like to be treated for influenza infection.  Discussed consequences of missed cardiac etiology and patient expressed understanding of the risks but did not want to proceed with blood work at this time. Concerning patient's influenza infection, we will proceed with oseltamavir and ondansetron treatments with plan for patient to be reassessed by her primary care provider within 48 hours or return if she has worsening of her syndrome. Patient expressed understanding of risk factors, potential effectiveness disease and importance of return precautions.  Patient ambulatory tolerating p.o. intake at time of discharge no acute distress stable for outpatient care and management at this time. Clinical Impression:  1. Fatigue, unspecified type   2. Flu      Discharge   Final Clinical Impression(s) / ED Diagnoses Final diagnoses:  Fatigue, unspecified type  Flu    Rx / DC Orders ED Discharge Orders          Ordered    oseltamivir (TAMIFLU) 75 MG capsule  2 times daily        07/22/22 1850    ondansetron (ZOFRAN) 4 MG tablet  Every 6 hours PRN        07/22/22 1850              Tretha Sciara, MD 07/22/22 1850

## 2022-07-23 ENCOUNTER — Ambulatory Visit: Payer: Medicare Other

## 2022-07-25 ENCOUNTER — Encounter: Payer: Self-pay | Admitting: Cardiology

## 2022-07-26 ENCOUNTER — Ambulatory Visit: Payer: Medicare Other | Admitting: Surgical

## 2022-07-26 ENCOUNTER — Encounter: Payer: Self-pay | Admitting: Internal Medicine

## 2022-07-26 ENCOUNTER — Telehealth: Payer: Self-pay | Admitting: Internal Medicine

## 2022-07-26 DIAGNOSIS — Z09 Encounter for follow-up examination after completed treatment for conditions other than malignant neoplasm: Secondary | ICD-10-CM | POA: Diagnosis not present

## 2022-07-26 DIAGNOSIS — I501 Left ventricular failure: Secondary | ICD-10-CM | POA: Diagnosis not present

## 2022-07-26 DIAGNOSIS — I428 Other cardiomyopathies: Secondary | ICD-10-CM

## 2022-07-26 DIAGNOSIS — I110 Hypertensive heart disease with heart failure: Secondary | ICD-10-CM | POA: Diagnosis not present

## 2022-07-26 DIAGNOSIS — G9331 Postviral fatigue syndrome: Secondary | ICD-10-CM | POA: Diagnosis not present

## 2022-07-26 NOTE — Telephone Encounter (Signed)
Called Pt and spoke to daughter, Ms. Shipman. 1st time Pt / family has reached out since 06/12/2022 visit to [facility].    Ms. Caroleen Hamman stated her mother wants the BiV ICD, and wants the 330 pm time slot on 08/17/2022 with Dr. Cristopher Peru.    Dr. Lovena Le in clinic today, but gone at time call received. We need to know what company of device Dr. Lovena Le wants to use.... unable to call scheduling at this time.   I will ask him in the morning, and schedule the procedure, and follow up with the patients daughter tomorrow.    Daughter understood.

## 2022-07-26 NOTE — Telephone Encounter (Signed)
Daughter is calling back regarding this due to not hearing back. She states they were advised this procedure would either be 10/09 or 10/11 and they were never called with confirmation. Please advise.

## 2022-07-26 NOTE — Telephone Encounter (Signed)
New message   Received secure chat from Dr. William Dalton office that pt wishes to proceed with procedure. She would like call back.

## 2022-07-27 ENCOUNTER — Ambulatory Visit: Payer: Medicare Other | Admitting: Surgical

## 2022-07-27 ENCOUNTER — Telehealth: Payer: Self-pay

## 2022-07-27 ENCOUNTER — Ambulatory Visit: Payer: Medicare Other

## 2022-07-27 NOTE — Telephone Encounter (Signed)
   Reason for call: ED-Follow up Visit  Patient  visit on 07/22/2022  at [place] you been able to follow up with your primary care physician? - Yes  The patient was or was not able to obtain any needed medicine or equipment. - Yes  Are there diet recommendations that you are having difficulty following? - No   Patient expresses understanding of discharge instructions and education provided has no other needs at this time.   [place] management  [place], [place]  Main Phone: [phone]  E-mail: [email or web address]  Website: [email or web address]

## 2022-07-30 ENCOUNTER — Ambulatory Visit: Payer: Medicare Other | Attending: Surgical

## 2022-07-30 ENCOUNTER — Other Ambulatory Visit: Payer: Self-pay

## 2022-07-30 ENCOUNTER — Encounter: Payer: Self-pay | Admitting: Adult Health

## 2022-07-30 ENCOUNTER — Inpatient Hospital Stay: Payer: Medicare Other | Attending: Hematology and Oncology | Admitting: Adult Health

## 2022-07-30 VITALS — BP 134/55 | HR 80 | Temp 97.7°F | Resp 18 | Ht 67.0 in | Wt 179.4 lb

## 2022-07-30 DIAGNOSIS — Z803 Family history of malignant neoplasm of breast: Secondary | ICD-10-CM | POA: Diagnosis not present

## 2022-07-30 DIAGNOSIS — Z923 Personal history of irradiation: Secondary | ICD-10-CM | POA: Diagnosis not present

## 2022-07-30 DIAGNOSIS — Z9013 Acquired absence of bilateral breasts and nipples: Secondary | ICD-10-CM | POA: Insufficient documentation

## 2022-07-30 DIAGNOSIS — C50412 Malignant neoplasm of upper-outer quadrant of left female breast: Secondary | ICD-10-CM

## 2022-07-30 DIAGNOSIS — Z1509 Genetic susceptibility to other malignant neoplasm: Secondary | ICD-10-CM

## 2022-07-30 DIAGNOSIS — Z171 Estrogen receptor negative status [ER-]: Secondary | ICD-10-CM

## 2022-07-30 DIAGNOSIS — Z1501 Genetic susceptibility to malignant neoplasm of breast: Secondary | ICD-10-CM

## 2022-07-30 DIAGNOSIS — Z1502 Genetic susceptibility to malignant neoplasm of ovary: Secondary | ICD-10-CM

## 2022-07-30 DIAGNOSIS — Z483 Aftercare following surgery for neoplasm: Secondary | ICD-10-CM | POA: Diagnosis not present

## 2022-07-30 DIAGNOSIS — Z9189 Other specified personal risk factors, not elsewhere classified: Secondary | ICD-10-CM | POA: Diagnosis not present

## 2022-07-30 DIAGNOSIS — E2839 Other primary ovarian failure: Secondary | ICD-10-CM

## 2022-07-30 NOTE — Therapy (Signed)
OUTPATIENT PHYSICAL THERAPY ONCOLOGY TREATMENT  Patient Name: Jordan Willis MRN: [id number] DOB:1951/02/06, 71 y.o., female Today's Date: 07/30/2022   PT End of Session - 07/30/22 1603     Visit Number 8    Number of Visits 13    Date for PT Re-Evaluation 08/09/22    PT Start Time 1611    PT Stop Time 1652    PT Time Calculation (min) 41 min    Activity Tolerance Patient tolerated treatment well    Behavior During Therapy [facility] for tasks assessed/performed             Past Medical History:  Diagnosis Date   Anemia    Anginal pain ([place])    Anxiety    Arthritis    Lumbar spine DDD.   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   Diabetes mellitus without complication (HCC)    Type II   Dyspnea    When patient gets sick uses Pro-Air inhaler   Frequent sinus infections    GERD (gastroesophageal reflux disease)    occ   Goiter    History of cardiomegaly    History of cataract    Bilateral   History of kidney stones    08/28/2018 currently has a large kidney stone, asymptomatic at this time   History of migraine    History of uterine fibroid    History of uterine prolapse    Hyperlipidemia    Hypertension    LBBB (left bundle branch block)    Menopause 03/03/2012   Nasal turbinate hypertrophy 11/29/2016   Left inferior    Personal history of chemotherapy    Personal history of radiation therapy    Pneumonia    as a child   PONV (postoperative nausea and vomiting)    Sinusitis 2014   Sleep apnea    No CPAP   SUI (stress urinary incontinence, female)    SVD (spontaneous vaginal delivery)    x 2   Thyroid nodule    Past Surgical History:  Procedure Laterality Date   ABDOMINAL HYSTERECTOMY     BILATERAL SALPINGOOPHORECTOMY Bilateral    BLADDER SUSPENSION N/A 05/16/2017   Procedure:  TRANSVAGINAL TAPE (TVT) PROCEDURE;  Surgeon: Everett Graff, MD;  Location: [facility];  Service: Gynecology;  Laterality: N/A;   BREAST BIOPSY Right 2009   BREAST IMPLANT REMOVAL Bilateral 02/05/2022   Procedure: Removal bilateral breast implants;  Surgeon: [name], MD;  Location: [place];  Service: Plastics;  Laterality: Bilateral;   BREAST LUMPECTOMY Right 2009   BREAST LUMPECTOMY Left 2006   BREAST RECONSTRUCTION WITH PLACEMENT OF TISSUE EXPANDER AND FLEX HD (ACELLULAR HYDRATED DERMIS) Bilateral 09/04/2021   Procedure: BILATERAL BREAST RECONSTRUCTION WITH PLACEMENT OF TISSUE EXPANDER AND FLEX HD (ACELLULAR HYDRATED DERMIS);  Surgeon: [name], MD;  Location: [facility];  Service: Plastics;  Laterality: Bilateral;   CARDIAC CATHETERIZATION     CATARACT EXTRACTION Left 10/23/2017   CATARACT EXTRACTION Right 11/06/2017   COLONOSCOPY  10/22/2009   Normal.  Repeat 5 years.  [name]   CYSTOSCOPY N/A 05/16/2017   Procedure: CYSTOSCOPY;  Surgeon: Everett Graff, MD;  Location: [facility];  Service: Gynecology;  Laterality: N/A;   DEBRIDEMENT AND CLOSURE WOUND Bilateral 10/03/2021   Procedure: Debridement bilateral mastectomy flaps;  Surgeon: Claudia Desanctis,  Steffanie Dunn, MD;  Location: [place] [facility];  Service: Plastics;  Laterality: Bilateral;  1.5 hour   EYE SURGERY     bilateral cataracts   LEFT HEART CATH AND CORONARY ANGIOGRAPHY N/A 02/09/2020   Procedure: LEFT HEART CATH AND CORONARY ANGIOGRAPHY;  Surgeon: Nigel Mormon, MD;  Location: [facility];  Service: Cardiovascular;  Laterality: N/A;   LEFT HEART CATH AND CORONARY ANGIOGRAPHY N/A 03/27/2022   Procedure: LEFT HEART CATH AND CORONARY ANGIOGRAPHY;  Surgeon: Adrian Prows, MD;  Location: [facility];  Service: Cardiovascular;  Laterality: N/A;   MASTECTOMY W/ SENTINEL NODE BIOPSY Right 09/04/2021   Procedure: BILATERAL MASTECTOMIES WITH RIGHT SENTINEL LYMPH NODE BIOPSY;  Surgeon: Stark Klein, MD;  Location: [place];  Service: General;   Laterality: Right;   REMOVAL OF BILATERAL TISSUE EXPANDERS WITH PLACEMENT OF BILATERAL BREAST IMPLANTS Bilateral 10/03/2021   Procedure: REMOVAL OF BILATERAL TISSUE EXPANDERS WITH REPLACEMENT OF TISSUE EXPANDERS;  Surgeon: [name], MD;  Location: [place];  Service: Plastics;  Laterality: Bilateral;   REMOVAL OF BILATERAL TISSUE EXPANDERS WITH PLACEMENT OF BILATERAL BREAST IMPLANTS Bilateral 12/19/2021   Procedure: REMOVAL OF BILATERAL TISSUE EXPANDERS WITH PLACEMENT OF BILATERAL BREAST IMPLANTS;  Surgeon: [name], MD;  Location: [place];  Service: Plastics;  Laterality: Bilateral;   ROBOTIC ASSISTED TOTAL HYSTERECTOMY N/A 05/16/2017   Procedure: ROBOTIC ASSISTED TOTAL HYSTERECTOMY;  Surgeon: Delsa Bern, MD;  Location: [facility];  Service: Gynecology;  Laterality: N/A;   ROTATOR CUFF REPAIR Bilateral    SHOULDER ARTHROSCOPY WITH ROTATOR CUFF REPAIR AND SUBACROMIAL DECOMPRESSION Right 09/03/2018   Procedure: Right shoulder manipulation under anesthesia, exam under anesthesia, mini open rotator cuff repair, subacromial decompression;  Surgeon: Susa Day, MD;  Location: [facility];  Service: Orthopedics;  Laterality: Right;  90 mins   TUBAL LIGATION     WISDOM TOOTH EXTRACTION     Patient Active Problem List   Diagnosis Date Noted   BRCA2 gene mutation positive in female 07/30/2022   LBBB (left bundle branch block)    Chronic diastolic heart failure (HCC)    NICM (nonischemic cardiomyopathy) ([place])    Breast cancer of upper-outer quadrant of right female breast ([place]) 09/04/2021   Diabetic neuropathy ([place]) 02/08/2021   Angina pectoris ([place]) 02/09/2020   Abnormal stress test 02/09/2020   Complete rotator cuff tear 09/03/2018   Pelvic prolapse 05/16/2017   Intractable chronic post-traumatic headache 11/19/2016   Malignant neoplasm of upper-outer quadrant of left breast in female, estrogen receptor negative ([place]) 03/03/2012   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 The week of the wedding I did my exercises but the next 2 weeks when my mom was sick and I got the flu I couldn't. My right scapular area is very sore today. When we were in [place] I was helping my mom in the tub and the incision at my left chest opened up and started bleeding again. Its not draining presently but the area is a little larger.    PERTINENT HISTORY:  Type 2 DM, cardiomyopathy, heart failure, recurrent breast cancer s/p bil mastectomy with Rt SLNB on 09/04/21 with [name].  0/6 positive lymph nodes removed.  Bilateral removal of implants 02/05/22 due to poor wound healing. Initial breast cancer treated in 2006 with chemotherapy/Lt lumpectomy/radiation.  Rt lumpectomy in 2009 with radiation on this side.  Currently on herceptin as able.    PAIN:  Are you having pain?yes, mainly  NPRS scale: 5/10 Pain location: Across chest feels very tight, right scapular area and Uts are sore from the stress Pain orientation: Bilateral  PAIN TYPE: tight, achy Pain description: intermittent and constant  Aggravating factors: using the arms Left greater than right, bending, reaching  Relieving factors: pain medication, ibuprofen  PRECAUTIONS: lymphedema risk Rt UE, possibly Lt LUE unable to find  information regarding status  WEIGHT BEARING RESTRICTIONS No  FALLS:  Has patient fallen in last 6 months? No  LIVING ENVIRONMENT: Lives with: lives alone and with 52 year old mother and my daughter is there helping Has following equipment: sometimes uses a walker  OCCUPATION: retired   LEISURE: trying to walking 6mn per day   HAND DOMINANCE : right   PRIOR LEVEL OF FUNCTION: Independent  PATIENT GOALS: reach the dishes and do yard work    OBJECTIVE  COGNITION:  Overall cognitive status: Within functional limits for tasks assessed   PALPATION:   OBSERVATIONS / OTHER ASSESSMENTS: wearing tank top with sanitary pad for drainage from the left lateral incision.  The Right side appears healed but the lateral 3inches of the left incision are open with watery drainage from wound with padding off x 166m dripping down trunk.  Surgeon is aware of status.  See photo from today.  06/14/2022 Incisions right healed, left healed except 1 very tiny area in a crease hard to visualize but without drainage 07/30/2022 left chest with enlargement of prior small wound area that was not healed. No drainage but at wound bed glistens slightly. Pt advised to watch and possibly cover to keep clean  POSTURE: forward head, rounded shoulders   UPPER EXTREMITY AROM/PROM:  A/PROM RIGHT   eval  06/14/2022 06/28/2022  Shoulder extension 30 38 52  Shoulder flexion 97 - pectoralis muscle 90 130  Shoulder abduction 72 69 137  Shoulder internal rotation     Shoulder external rotation       (Blank rows = not tested)  A/PROM LEFT   eval 06/14/2022 06/28/2022  Shoulder extension 30 30 46  Shoulder flexion 75 -tight in upper shoulder  60 130  Shoulder abduction 55 47 111  Shoulder internal rotation     Shoulder external rotation       (Blank rows = not tested)   limited  Flexion   Extension   Right lateral flexion 25%  Left lateral flexion 25%  Right rotation 15%  Left rotation 15%     UPPER  EXTREMITY STRENGTH: not tested today    LYMPHEDEMA ASSESSMENTS:  LANDMARK RIGHT  eval  10 cm proximal to olecranon process 33.4  Olecranon process 27.9  10 cm proximal to ulnar styloid process 22.8  Just proximal to ulnar styloid process 17.3  Across hand at thumb web space 20.1  At base of 2nd digit 6.35  (Blank rows = not tested)  LANDMARK LEFT  eval  10 cm proximal to olecranon process 34.1  Olecranon process 27.4  10 cm proximal to ulnar styloid process 21.4  Just proximal to ulnar styloid process 17.55  Across hand at thumb web space 19.9  At base of 2nd digit 6.75  (Blank rows = not tested)   FUNCTIONAL TESTS:  Not performed today due to EF  QUICK DASH SURVEY: 59%  TODAY'S TREATMENT  07/30/2022 Performed Soft tissue mobilization to bilateral UT, pectorals and lateral trunk in supine, and right scapular area in left SL all with cocoa butter Pt performed  AAROM supine  wand shoulder  shoulder flexion and scaption x 5 ea PROM bilateral shoulder flexion, scaption, abduction, ER. VC's required to get pt to relax. Checked wound area left chest; slightly larger than when seen in Sept but no visible drainage. Fairly deep area appears like a puncture.     07/04/2022 Overhead pulleys for flexion and abduction 2 min each Performed Soft tissue mobilization to bilateral UT, pectorals and lateral trunk in supine Pt performed  AAROM supine  wand shoulder x 10 LTR with goalpost arms x 5bil PROM bilateral shoulder flexion, scaption,IR and ER with VC's to relax Supine ER and horizontal abduction with yellow band x 5 with moderate vcs needed for performance   07/02/2022 Overhead pulleys for flexion 2 min, scaption and abduction 1:30 min ea, demonstrated by PT and performed by pt with occasional VC's, Performed Soft tissue mobilization to bilateral UT, pectorals and lateral trunk in supine,  Pt performed  AAROM supine  wand shoulder flexion and scaption  x 5,  PROM bilateral  shoulder flexion, scaption,IR and ER with VC's to relax  Supine ER and horizontal abduction with yellow band x 5  06/28/2022 Overhead pulleys for flexion 2 min, scaption and abduction 1 min ea, demonstrated by PT and performed by pt with occasional VC's, Performed Soft tissue mobilization to bilateral UT, pectorals and lateral trunk in supine,  Pt performed  AAROM supine  wand shoulder flexion and scaption  x 5,  PROM bilateral shoulder flexion, scaption,IR and ER with VC's to relax  Measured ROM  PATIENT EDUCATION:  Education details: Access Code: [email or web address] URL: [email or web address] Date: 06/20/2022 Prepared by: Cheral Almas  Exercises - Supine Lower Trunk Rotation  - 1 x daily - 7 x weekly - 1 sets - 3 reps - 10 hold - Supine Shoulder Flexion with Dowel  - 1 x daily - 7 x weekly - 1 sets - 5 reps - 10 hold Exercises - Supine Lower Trunk Rotation  - 1 x daily - 7 x weekly - 1 sets - 3 reps - 10 hold - Supine Shoulder Flexion with Dowel  - 1 x daily - 7 x weekly - 1 sets - 5 reps - 10 hold  Access Code: [email or web address] URL: [email or web address] Date: 03/26/2022 Prepared by: Shan Levans  Exercises - Seated Diaphragmatic Breathing - 2 x daily - 7 x weekly - 10 reps - Seated Scapular Clock (1 to 7) - 2 x daily - 7 x weekly - 10 reps - Seated Shoulder Rolls - 2 x daily - 7 x weekly - 10 reps - Seated Cervical  Sidebending Stretch - 2 x daily - 7 x weekly - 1-3 sets - 3 reps - 10-20 seconds hold - Sit to Stand - 1-2 x daily - 7 x weekly - 2-10 reps - Standing Shoulder Flexion Wall Walk - 2 x daily - 7 x weekly - 1 sets - 3 reps - no hold  ASSESSMENT: CLINICAL IMPRESSION:  Pt has had a stressful few weeks after flying to a wedding in [place] with her 45 yr old mother who then got sick and was not able to fly home which extended their stay, and then pt got the flu. ROM restricted initially but improved after STM and PROM. ROM looked very godd and felt good when pt  was able to fully relax. Wound area is slightly larger since helping her mom into the tub, but is not presently draining. She will see MD on Friday.  OBJECTIVE IMPAIRMENTS decreased activity tolerance, decreased ROM, decreased strength, impaired UE functional use, and pain.   ACTIVITY LIMITATIONS carrying, lifting, and reach over head  PARTICIPATION LIMITATIONS: meal prep, cleaning, laundry, community activity, and yard work  [place] Age and 1-2 comorbidities: previous radiation, DM,   are also affecting patient's functional outcome.   REHAB POTENTIAL: Good  CLINICAL DECISION MAKING: Stable/uncomplicated  EVALUATION COMPLEXITY: Low  GOALS: Goals reviewed with patient? Yes  SHORT TERM GOALS:   Target date 07/12/2022   1. Pt will improve right and left shoulder flexion to 125 degrees  MET  06/28/2022  2  Pt will improve bilateral shoulder abduction to 125 degrees   NEW at Re-eval    LONG TERM GOALS: Target date: 08/09/2022  Pt will improve bil shoulder flexion AROM to at least 145 to improve reach into her cabinets Baseline:  Goal status: Ongoing  2.  Pt will decrease resting Lt UT region pain to 1/10 or less Baseline: 7/10 Goal status: Ongoing  3.  Pt will be able to return to work in her yard without restructions Baseline:  Goal status: Ongoing  PLAN: PT FREQUENCY: 2-3x per week  PT DURATION: 8 weeks  PLANNED INTERVENTIONS: Therapeutic exercises, Patient/Family education, Manual therapy, and Re-evaluation,   PLAN FOR NEXT SESSION:  pulleys,ball soft tissue mobilization prn, and PROM monitoring incision. Increase AAROM as able, and progress as tolerated. Check stitch on right medial incision and monitor Left incision for drainage but did  well today, progress to strength when ready, MLD to chest prn. Review supine scapular stabs and update.   Claris Pong, PT 07/30/2022, 4:59 PM

## 2022-07-30 NOTE — Progress Notes (Signed)
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%, PR 0%, HER-2 negative, Ki-67 31%   09/26/2005 - 11/13/2005 Radiation Therapy   Adjuvant radiation therapy   03/18/2008 Initial Biopsy   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   03/22/2008 Surgery   Right breast lumpectomy 0.9 cm DCIS ER 90% PR 98% stage 0   04/11/2008 Procedure   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   05/10/2008 - 07/02/2008 Radiation Therapy   Adjuvant radiation therapy   07/17/2008 - 04/16/2009 Anti-estrogen oral therapy   Femara then switched to tamoxifen which was discontinued in 2010 due to concern about uterine cancer   05/31/2021 Cancer Staging   Staging form: Breast, AJCC 7th Edition - Clinical: Stage IA (rT1c, N0, M0) - Signed by Nicholas Lose, MD on 05/31/2021 Stage prefix: Recurrence Laterality: Right Biopsy of metastatic site performed: No Estrogen receptor status: Positive Progesterone receptor status: Negative HER2 status: Positive   09/12/2021 Cancer Staging   Staging form: Breast, AJCC 7th Edition - Pathologic: Stage IIA (T2, N0, cM0) - Signed by Nicholas Lose, MD on 09/12/2021 Estrogen receptor status: Positive Progesterone receptor status: Positive HER2 status: Negative   Breast cancer of upper-outer quadrant of right female breast ([place])  05/23/2021 Relapse/Recurrence    Screening detected right breast mass at 10 o'clock position measuring 1.2 cm, ultrasound-guided biopsy revealed grade 3 IDC ER 20%, PR 0%, HER2 positive 3+, Ki-67 50%   09/04/2021 Surgery   Bilateral mastectomies Left mastectomy: Benign Right mastectomy: Grade 3 IDC with DCIS 3.6 cm, 0/6 lymph nodes negative, ER 20%, PR 0%, HER2 positive by IHC, Ki-67 50%   11/21/2021 - 01/23/2022 Chemotherapy   Patient is on Treatment Plan : BREAST Trastuzumab q21d     04/2022 -  Anti-estrogen oral therapy   Anastrozole daily     INTERVAL HISTORY:  Jordan Willis to review her survivorship care plan detailing her treatment course for breast cancer, as well as monitoring long-term side effects of that treatment, education regarding health maintenance, screening, and overall wellness and health promotion.     Overall, Jordan Willis reports feeling quite well.  She was initially started on anastrozole daily however developed significant headaches that she was taking medication for daily and therefore it was discontinued.  She was prescribed exemestane however has not yet started this because her mom has been in the hospital.  She just recovered from the flu and plans to start the exemestane tomorrow.  About 2 weeks ago she said her scars opened up and she began bleeding from her mastectomy site when she was assisting her mom.  The bleeding did subside but this was concerning to her.  REVIEW OF SYSTEMS:  Review of Systems  Constitutional:  Negative for appetite change, chills, fatigue, fever and unexpected weight change.  HENT:   Negative for hearing loss, lump/mass and trouble swallowing.   Eyes:  Negative for eye problems and icterus.  Respiratory:  Negative for chest tightness, cough and shortness of breath.   Cardiovascular:  Negative for chest pain, leg swelling and palpitations.  Gastrointestinal:  Negative for abdominal distention, abdominal pain, constipation, diarrhea, nausea and vomiting.  Endocrine: Negative  for hot flashes.  Genitourinary:  Negative for difficulty urinating.   Musculoskeletal:  Negative for arthralgias.  Skin:  Negative for itching and rash.  Neurological:  Negative for dizziness, extremity weakness, headaches and numbness.  Hematological:  Negative for adenopathy. Does not bruise/bleed easily.  Psychiatric/Behavioral:  Negative for depression. The patient is not nervous/anxious.        ONCOLOGY TREATMENT TEAM:  1. Surgeon:  [name] at [facility] 2. Medical Oncologist: [name]      PAST MEDICAL/SURGICAL HISTORY:  Past Medical History:  Diagnosis Date  . Anemia   . Anginal pain ([place])   . Anxiety   . Arthritis    Lumbar spine DDD.  Marland Kitchen Breast cancer, female, left 03/03/2012  . Cardiomyopathy ([place])   . Carpal tunnel syndrome    Bilateral, Mild  . Cerebral atherosclerosis   . Chronic sinusitis   . 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 Diabetes mellitus without complication ([place])    Type II  . Dyspnea    When patient gets sick uses Pro-Air inhaler  . Frequent sinus infections   . GERD (gastroesophageal reflux disease)    occ  . Goiter   . History of cardiomegaly   . History of cataract    Bilateral  . History of kidney stones    08/28/2018 currently has a large kidney stone, asymptomatic at this time  . History of migraine   . History of uterine fibroid   . History of uterine prolapse   . Hyperlipidemia   . Hypertension   . LBBB (left bundle branch block)   . Menopause 03/03/2012  . Nasal turbinate hypertrophy 11/29/2016   Left inferior   . Personal history of chemotherapy   . Personal history of radiation therapy   . Pneumonia    as a child  . PONV (postoperative nausea and vomiting)   . Sinusitis 2014  . Sleep apnea    No CPAP  . SUI (stress urinary incontinence, female)   . SVD (spontaneous vaginal delivery)    x 2  . Thyroid nodule    Past Surgical History:  Procedure Laterality Date  .  ABDOMINAL HYSTERECTOMY    . BILATERAL SALPINGOOPHORECTOMY Bilateral   . BLADDER SUSPENSION N/A 05/16/2017   Procedure: TRANSVAGINAL TAPE (TVT) PROCEDURE;  Surgeon: Everett Graff, MD;  Location: [facility];  Service: Gynecology;  Laterality: N/A;  . BREAST BIOPSY Right 2009  . BREAST IMPLANT REMOVAL Bilateral 02/05/2022   Procedure: Removal bilateral breast implants;  Surgeon: [name], MD;  Location: [facility];  Service: Plastics;  Laterality: Bilateral;  . BREAST LUMPECTOMY Right 2009  . BREAST LUMPECTOMY Left 2006  . BREAST RECONSTRUCTION WITH PLACEMENT OF TISSUE EXPANDER AND FLEX HD (ACELLULAR HYDRATED DERMIS) Bilateral 09/04/2021   Procedure: BILATERAL BREAST RECONSTRUCTION WITH PLACEMENT OF TISSUE EXPANDER AND FLEX HD (ACELLULAR HYDRATED DERMIS);  Surgeon: [name], MD;  Location: [facility];  Service: Plastics;  Laterality: Bilateral;  . CARDIAC CATHETERIZATION    . CATARACT EXTRACTION Left 10/23/2017  . CATARACT EXTRACTION Right 11/06/2017  . COLONOSCOPY  10/22/2009   Normal.  Repeat 5 years.  Nat [name]  . CYSTOSCOPY N/A 05/16/2017   Procedure:  CYSTOSCOPY;  Surgeon: Everett Graff, MD;  Location: [facility];  Service: Gynecology;  Laterality: N/A;  . DEBRIDEMENT AND CLOSURE WOUND Bilateral 10/03/2021   Procedure: Debridement bilateral mastectomy flaps;  Surgeon: [name], MD;  Location: [facility];  Service: Plastics;  Laterality: Bilateral;  1.5 hour  . EYE SURGERY     bilateral cataracts  . LEFT HEART CATH AND CORONARY ANGIOGRAPHY N/A 02/09/2020   Procedure: LEFT HEART CATH AND CORONARY ANGIOGRAPHY;  Surgeon: Nigel Mormon, MD;  Location: [facility];  Service: Cardiovascular;  Laterality: N/A;  . LEFT HEART CATH AND CORONARY ANGIOGRAPHY N/A 03/27/2022   Procedure: LEFT HEART CATH AND CORONARY ANGIOGRAPHY;  Surgeon: Adrian Prows, MD;  Location: [facility];  Service: Cardiovascular;  Laterality: N/A;  . MASTECTOMY W/ SENTINEL NODE BIOPSY Right 09/04/2021    Procedure: BILATERAL MASTECTOMIES WITH RIGHT SENTINEL LYMPH NODE BIOPSY;  Surgeon: Stark Klein, MD;  Location: [facility];  Service: General;  Laterality: Right;  . REMOVAL OF BILATERAL TISSUE EXPANDERS WITH PLACEMENT OF BILATERAL BREAST IMPLANTS Bilateral 10/03/2021   Procedure: REMOVAL OF BILATERAL TISSUE EXPANDERS WITH REPLACEMENT OF TISSUE EXPANDERS;  Surgeon: [name], MD;  Location: [facility];  Service: Plastics;  Laterality: Bilateral;  . REMOVAL OF BILATERAL TISSUE EXPANDERS WITH PLACEMENT OF BILATERAL BREAST IMPLANTS Bilateral 12/19/2021   Procedure: REMOVAL OF BILATERAL TISSUE EXPANDERS WITH PLACEMENT OF BILATERAL BREAST IMPLANTS;  Surgeon: [name], MD;  Location: [facility];  Service: Plastics;  Laterality: Bilateral;  . ROBOTIC ASSISTED TOTAL HYSTERECTOMY N/A 05/16/2017   Procedure: ROBOTIC ASSISTED TOTAL HYSTERECTOMY;  Surgeon: Delsa Bern, MD;  Location: [facility];  Service: Gynecology;  Laterality: N/A;  . ROTATOR CUFF REPAIR Bilateral   . SHOULDER ARTHROSCOPY WITH ROTATOR CUFF REPAIR AND SUBACROMIAL DECOMPRESSION Right 09/03/2018   Procedure: Right shoulder manipulation under anesthesia, exam under anesthesia, mini open rotator cuff repair, subacromial decompression;  Surgeon: Susa Day, MD;  Location: [facility];  Service: Orthopedics;  Laterality: Right;  90 mins  . TUBAL LIGATION    . WISDOM TOOTH EXTRACTION       ALLERGIES:  Allergies  Allergen Reactions  . Bactrim [Sulfamethoxazole-Trimethoprim] Shortness Of Breath    Shortness of breath, difficulty breathing, headache, rash, fatigue  . Aspirin Palpitations and Other (See Comments)    Heart fluttering, pt takes 40.5 mg (half of 81 mg) daily Other reaction(s): bradycardia, Not available  . Sulfa Antibiotics Hives  . Tape Rash    Tears skin off     CURRENT MEDICATIONS:  Outpatient Encounter Medications as of 07/30/2022  Medication Sig  . albuterol (VENTOLIN HFA) 108 (90 Base) MCG/ACT inhaler Inhale 8.5 puffs into  the lungs as needed.  Marland Kitchen anastrozole (ARIMIDEX) 1 MG tablet TAKE 1 TABLET BY MOUTH EVERY DAY  . anastrozole (ARIMIDEX) 1 MG tablet Take 1 mg by mouth daily.  . Ascorbic Acid (VITAMIN C PO) Take 500 mg by mouth daily.  Marland Kitchen atorvastatin (LIPITOR) 20 MG tablet TAKE 1 TABLET BY MOUTH EVERYDAY AT BEDTIME  . b complex vitamins capsule Take 1 capsule by mouth daily.  . bumetanide (BUMEX) 1 MG tablet TAKE 1 TABLET BY MOUTH EVERY DAY  . cetirizine (ZYRTEC) 10 MG tablet Take 10 mg by mouth in the morning.  . Cholecalciferol (VITAMIN D3) 25 MCG (1000 UT) CAPS Take 1,000 Units by mouth in the morning.  Marland Kitchen exemestane (AROMASIN) 25 MG tablet Take 1 tablet (25 mg total) by mouth daily after breakfast.  . FARXIGA 10 MG TABS  tablet TAKE 1 TABLET BY MOUTH EVERY DAY BEFORE BREAKFAST  . fluticasone (FLONASE) 50 MCG/ACT nasal spray Place 1 spray into both nostrils in the morning.  Marland Kitchen GLUCOSAMINE-CHONDROITIN PO Take 1 tablet by mouth every evening.   Marland Kitchen glucose blood (ONETOUCH VERIO) test strip TEST ONCE DAILY AS DIRECTED 90  . ibuprofen (ADVIL) 200 MG tablet Take 400 mg by mouth every 6 (six) hours as needed (for pain.).  Marland Kitchen metFORMIN (GLUCOPHAGE-XR) 500 MG 24 hr tablet Take 500 mg by mouth 2 (two) times daily.  . metoprolol succinate (TOPROL-XL) 25 MG 24 hr tablet TAKE 1 TABLET (25 MG TOTAL) BY MOUTH IN THE MORNING. HOLD IF SYSTOLIC BLOOD PRESSURE (TOP BLOOD PRESSURE NUMBER) LESS THAN 100 MMHG OR HEART RATE LESS THAN 60 BPM (PULSE). (Patient taking differently: Take 25 mg by mouth daily. Hold if systolic blood pressure (top blood pressure number) is less than 100 MMHG or heart rate less than 60 BPM (pulse))  . Multiple Vitamin (MULTIVITAMIN WITH MINERALS) TABS tablet Take 1 tablet by mouth daily. Centrum Silver for Women 50+  . nitroGLYCERIN (NITROSTAT) 0.4 MG SL tablet Place 0.4 mg under the tongue as needed.  . Olopatadine HCl 0.2 % SOLN Place 1 drop into both eyes daily as needed (allergies and itching eyes). [place]  .  ondansetron (ZOFRAN) 4 MG tablet Take 1 tablet (4 mg total) by mouth every 6 (six) hours as needed for nausea or vomiting.  . ondansetron (ZOFRAN-ODT) 4 MG disintegrating tablet Take 4 mg by mouth every 6 (six) hours as needed.  Glory Rosebush Delica Lancets 56D MISC Apply topically.  Marland Kitchen oseltamivir (TAMIFLU) 75 MG capsule Take 1 capsule (75 mg total) by mouth 2 (two) times daily.  . Probiotic Product (PROBIOTIC DAILY PO) Take 1 capsule by mouth in the morning.  . sacubitril-valsartan (ENTRESTO) 49-51 MG Take 1 tablet by mouth 2 (two) times daily.  . sertraline (ZOLOFT) 50 MG tablet Take 50 mg by mouth in the morning.  . solifenacin (VESICARE) 10 MG tablet Take 10 mg by mouth in the morning.  Marland Kitchen spironolactone (ALDACTONE) 25 MG tablet TAKE 1 TABLET BY MOUTH EVERY DAY IN THE MORNING  . traZODone (DESYREL) 50 MG tablet Take 50 mg by mouth at bedtime as needed for sleep.   No facility-administered encounter medications on file as of 07/30/2022.     ONCOLOGIC FAMILY HISTORY:  Family History  Problem Relation Age of Onset  . Cancer Mother 16       breast cancer  . Hypertension Mother   . Diabetes Father   . Cancer Sister        breast cancer  . Breast cancer Sister   . Cancer Sister        breast cancer  . Breast cancer Sister      SOCIAL HISTORY:  Social History   Socioeconomic History  . Marital status: Divorced    Spouse name: n/a  . Number of children: 2  . Years of education: 3  . Highest education level: Not on file  Occupational History  . Occupation: Retired    Fish farm manager: [employer]    Comment: [employer]  Tobacco Use  . Smoking status: Never  . Smokeless tobacco: Never  Vaping Use  . Vaping Use: Never used  Substance and Sexual Activity  . Alcohol use: No  . Drug use: No  . Sexual activity: Not Currently    Partners: Male    Birth control/protection: Surgical, Post-menopausal    Comment: Hysterectomy  Other Topics Concern  . Not on file  Social History  Narrative   Marital status: divorced; dating seriously x many years.       Children:  2 children; 1 grandchild.      Employment: Personnel officer.      Lives: alone      Tobacco: none       Alcohol: none      Exercise:  Walking with sister two days per week.      Seatbelt: 100%      Guns: none      Right-handed   Caffeine: occasional coffee, hot tea or green tea a few times per week   Social Determinants of Health   Financial Resource Strain: Not on file  Food Insecurity: Not on file  Transportation Needs: Not on file  Physical Activity: Not on file  Stress: Not on file  Social Connections: Not on file  Intimate Partner Violence: Not on file     OBSERVATIONS/OBJECTIVE:  BP (!) 134/55 (BP Location: Left Arm, Patient Position: Sitting)   Pulse 80   Temp 97.7 F (36.5 C) (Tympanic)   Resp 18   Ht $R'5\' 7"'NA$  (1.702 m)   Wt 179 lb 6.4 oz (81.4 kg)   SpO2 100%   BMI 28.10 kg/m  GENERAL: Patient is a well appearing female in no acute distress HEENT:  Sclerae anicteric.  Oropharynx clear and moist. No ulcerations or evidence of oropharyngeal candidiasis. Neck is supple.  NODES:  No cervical, supraclavicular, or axillary lymphadenopathy palpated.  BREAST EXAM: Status post bilateral mastectomies no sign of local recurrence.  Left mastectomy site shows a small open area there is no bleeding or drainage from this area it does not appear to be infected there is no sign of local recurrence or nodularity near the area. LUNGS:  Clear to auscultation bilaterally.  No wheezes or rhonchi. HEART:  Regular rate and rhythm. No murmur appreciated. ABDOMEN:  Soft, nontender.  Positive, normoactive bowel sounds. No organomegaly palpated. MSK:  No focal spinal tenderness to palpation. Full range of motion bilaterally in the upper extremities. EXTREMITIES:  No peripheral edema.   SKIN:  Clear with no obvious rashes or skin changes. No nail dyscrasia. NEURO:  Nonfocal. Well oriented.  Appropriate  affect.   LABORATORY DATA:  None for this visit.  DIAGNOSTIC IMAGING:  None for this visit.      ASSESSMENT AND PLAN:  Ms.. Willis is a pleasant 71 y.o. 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  She presents to the Survivorship Clinic for our initial meeting and routine follow-up post-completion of treatment for breast cancer.    1. Stage 2A right breast cancer:  Jordan Willis is continuing to recover from definitive treatment for breast cancer. She will follow-up with her medical oncologist, Dr. Payton Mccallum in 8 weeks for follow-up to discuss how she is tolerating exemestane and to review Signatera testing recommendations.  Today, a comprehensive survivorship care plan and treatment summary was reviewed with the patient today detailing her breast cancer diagnosis, treatment course, potential late/long-term effects of treatment, appropriate follow-up care with recommendations for the future, and patient education resources.  A copy of this summary, along with a letter will be sent to the patient's primary care provider via mail/fax/In Basket message after today's visit.    2.  Left mastectomy dehiscence: There is a small opening  that has developed at the mastectomy scar line.  This may need to heal from the inside out but it does not appear to be infected and is not draining.  She will follow-up with surgery about this to discuss management of this open area.  3.  Altered body image: I will place a referral to our social worker to get any set up with counseling.  4.  Advance care planning: Discussed what this means in an considering that and he is going to undergo pacemaker and defibrillator placement later this month I recommended that she go ahead and get these forms completed.  She is very interested in doing so  therefore I gave her the paperwork and the dates and times of our advanced directives clinic.  She understands how to sign up and participate in the clinic so she can get these forms completed.  I also suggested that if one of the clinic times does not work for her that she call our support services phone number listed on the paperwork so that she could get an appointment to review and complete the documentation.  5. Bone health:  Given Jordan Willis's age/history of breast cancer and her current treatment regimen including anti-estrogen therapy with exemestane, she is at risk for bone demineralization.  Has been 10 years since her last bone density testing therefore I placed orders for this to be completed.  She was given education on specific activities to promote bone health.  6. Cancer screening:  Due to Jordan Willis's history and her age, she should receive screening for skin cancers, colon cancer, and gynecologic cancers.  The information and recommendations are listed on the patient's comprehensive care plan/treatment summary and were reviewed in detail with the patient.    7. Health maintenance and wellness promotion: Jordan Willis was encouraged to consume 5-7 servings of fruits and vegetables per day. We reviewed the "Nutrition Rainbow" handout.  She was also encouraged to engage in moderate to vigorous exercise for 30 minutes per day most days of the week. We discussed the [employer] fitness program, which is designed for cancer survivors to help them become more physically fit after cancer treatments.  She was instructed to limit her alcohol consumption and continue to abstain from tobacco use.     8. Support services/counseling: It is not uncommon for this period of the patient's cancer care trajectory to be one of many emotions and stressors. She was given information regarding our available services and encouraged to contact me with any questions or for help enrolling in any of our support  group/programs.    Follow up instructions:    -Return to cancer center in 8 weeks for phone visit with [name] to review exemestane -Bone density testing ordered -Follow up with surgery 2 weeks -She is welcome to return back to the Survivorship Clinic at any time; no additional follow-up needed at this time.  -Consider referral back to survivorship as a long-term survivor for continued surveillance  The patient was provided an opportunity to ask questions and all were answered. The patient agreed with the plan and demonstrated an understanding of the instructions.   Total encounter time:45 minutes*in face-to-face visit time, chart review, lab review, care coordination, order entry, and documentation of the encounter time.    Wilber Bihari, NP 07/30/22 11:11 AM Medical Oncology and Hematology [facility] [place], [place] [address] Tel. [phone]    Fax. [phone]  *Total Encounter Time as defined by the  Centers for Medicare and Medicaid Services includes, in addition to the face-to-face time of a patient visit (documented in the note above) non-face-to-face time: obtaining and reviewing outside history, ordering and reviewing medications, tests or procedures, care coordination (communications with other health care professionals or caregivers) and documentation in the medical record.

## 2022-07-31 ENCOUNTER — Ambulatory Visit (INDEPENDENT_AMBULATORY_CARE_PROVIDER_SITE_OTHER): Payer: Medicare Other | Admitting: Surgical

## 2022-07-31 ENCOUNTER — Encounter: Payer: Self-pay | Admitting: Surgical

## 2022-07-31 ENCOUNTER — Telehealth: Payer: Medicare Other | Admitting: Hematology and Oncology

## 2022-07-31 DIAGNOSIS — D0511 Intraductal carcinoma in situ of right breast: Secondary | ICD-10-CM

## 2022-07-31 DIAGNOSIS — Z9013 Acquired absence of bilateral breasts and nipples: Secondary | ICD-10-CM

## 2022-07-31 DIAGNOSIS — Z923 Personal history of irradiation: Secondary | ICD-10-CM

## 2022-07-31 DIAGNOSIS — S21002A Unspecified open wound of left breast, initial encounter: Secondary | ICD-10-CM | POA: Diagnosis not present

## 2022-07-31 DIAGNOSIS — Z9889 Other specified postprocedural states: Secondary | ICD-10-CM

## 2022-07-31 NOTE — Progress Notes (Signed)
Referring Provider [name], MD [address] [place],  [place] [address]   CC:  Chief Complaint  Patient presents with   Follow-up      Jordan Willis is an 71 y.o. female.  HPI: Patient is a 71 year old female here for follow-up on her bilateral breast cancer.  She has a history of breast cancer with subsequent breast reconstruction and placement of bilateral breast implants.  Implants were subsequently removed on 02/05/2022 due to right sided implant being exposed.  She has a history of radiation to bilateral breast.  She reports she recently returned from [place] for a family wedding.  She had a great time.  She does report at the end of the trip her mother became ill with the flu and was hospitalized in [place] for 2 weeks.  She is now hospitalized here in [place].  She reports that she has been doing physical therapy and reports this has been very helpful with her range of motion.  She is able to lift her left arm much higher.  She does feel as if the left breast wound opened up a bit while she was in [place] helping her mother into the hot tub.  She has been putting Vaseline on the area, but not keeping it covered with gauze.  She is not having any infectious symptoms.  She is here with her son and daughter today.  Review of Systems General: No fevers or chills  Physical Exam    07/30/2022   10:02 AM 07/22/2022    6:47 PM 07/22/2022    4:15 PM  Vitals with BMI  Height '5\' 7"'$     Weight 179 lbs 6 oz    BMI 44.81    Systolic 856 314 970  Diastolic 55 73 63  Pulse 80 85 88    General:  No acute distress,  Alert and oriented, Non-Toxic, Normal speech and affect Breast: Right breast incisions intact, mastectomy flaps with radiation skin changes noted, some scarring and firmness noted.  Mastectomy flaps adherent to the chest wall.  Left breast incision overall intact, she does have a wound that is approximately 3 x 2 x 4 mm.  There is no surrounding  erythema or cellulitic changes noted.  There is no active drainage.  There is a good base of healthy granulation tissue noted.  No foul odor is noted.  Left breast scarring and radiation changes noted.  Assessment/Plan 71 year old female status post abandoned breast reconstruction due to ongoing wound complications related to radiation.  She recently returned from a trip to [place] for a family wedding, she had a great time.  She reports that she has been doing Vaseline to the left breast wound, she has been doing physical therapy which has been very helpful.  I recommend packing the left breast wound with iodoform packing gauze daily, instructed patient's family on how to do the dressings.  Instructed them the importance of removing the packing daily and counting the packing removed to be sure did not leave any packing behind.  Specifically recommended only using 1 packing strip at a time.  We will plan to see her back in a few weeks.  I would like her to consult with Dr. Lovena Le for his input since [name] is no longer here at [facility].  Pictures were obtained of the patient and placed in the chart with the patient's or guardian's permission.   Carola Rhine Giavonni Cizek 07/31/2022, 11:45 AM

## 2022-08-01 ENCOUNTER — Ambulatory Visit: Payer: Medicare Other | Attending: Internal Medicine

## 2022-08-01 DIAGNOSIS — I428 Other cardiomyopathies: Secondary | ICD-10-CM | POA: Diagnosis not present

## 2022-08-01 LAB — CBC WITH DIFFERENTIAL/PLATELET
Basophils Absolute: 0 10*3/uL (ref 0.0–0.2)
Basos: 0 %
EOS (ABSOLUTE): 0.1 10*3/uL (ref 0.0–0.4)
Eos: 1 %
Hematocrit: 35.9 % (ref 34.0–46.6)
Hemoglobin: 12 g/dL (ref 11.1–15.9)
Immature Grans (Abs): 0 10*3/uL (ref 0.0–0.1)
Immature Granulocytes: 0 %
Lymphocytes Absolute: 1.6 10*3/uL (ref 0.7–3.1)
Lymphs: 33 %
MCH: 29.2 pg (ref 26.6–33.0)
MCHC: 33.4 g/dL (ref 31.5–35.7)
MCV: 87 fL (ref 79–97)
Monocytes Absolute: 0.7 10*3/uL (ref 0.1–0.9)
Monocytes: 14 %
Neutrophils Absolute: 2.5 10*3/uL (ref 1.4–7.0)
Neutrophils: 52 %
Platelets: 285 10*3/uL (ref 150–450)
RBC: 4.11 x10E6/uL (ref 3.77–5.28)
RDW: 13.6 % (ref 11.7–15.4)
WBC: 5 10*3/uL (ref 3.4–10.8)

## 2022-08-01 LAB — BASIC METABOLIC PANEL
BUN/Creatinine Ratio: 14 (ref 12–28)
BUN: 11 mg/dL (ref 8–27)
CO2: 26 mmol/L (ref 20–29)
Calcium: 9.8 mg/dL (ref 8.7–10.3)
Chloride: 101 mmol/L (ref 96–106)
Creatinine, Ser: 0.8 mg/dL (ref 0.57–1.00)
Glucose: 99 mg/dL (ref 70–99)
Potassium: 4.3 mmol/L (ref 3.5–5.2)
Sodium: 139 mmol/L (ref 134–144)
eGFR: 79 mL/min/{1.73_m2} (ref >59)

## 2022-08-01 NOTE — Telephone Encounter (Signed)
Pt arrived 08/01/22 for lab draw BMET / CBC for procedure on 08/17/22 with Dr. Cristopher Peru.    Pt given procedure letter, surgical scrub, and educated on procedure with Dr. Lovena Le on 10/27.  Pt questions addressed, and blood samples obtained for procedure.  Pt advised to reach out to [facility] with any other questions; Pt understood.

## 2022-08-01 NOTE — Addendum Note (Signed)
Addended by: Oleta Mouse C on: 08/01/2022 08:59 AM   Modules accepted: Orders

## 2022-08-02 ENCOUNTER — Emergency Department (HOSPITAL_BASED_OUTPATIENT_CLINIC_OR_DEPARTMENT_OTHER)
Admission: EM | Admit: 2022-08-02 | Discharge: 2022-08-02 | Disposition: A | Discharge status: Home / Self Care | Payer: Medicare Other | Attending: Emergency Medicine | Admitting: Emergency Medicine

## 2022-08-02 ENCOUNTER — Other Ambulatory Visit: Payer: Self-pay

## 2022-08-02 ENCOUNTER — Telehealth: Payer: Self-pay

## 2022-08-02 ENCOUNTER — Encounter (HOSPITAL_BASED_OUTPATIENT_CLINIC_OR_DEPARTMENT_OTHER): Payer: Self-pay

## 2022-08-02 DIAGNOSIS — Z7984 Long term (current) use of oral hypoglycemic drugs: Secondary | ICD-10-CM | POA: Insufficient documentation

## 2022-08-02 DIAGNOSIS — U071 COVID-19: Secondary | ICD-10-CM | POA: Insufficient documentation

## 2022-08-02 DIAGNOSIS — H9209 Otalgia, unspecified ear: Secondary | ICD-10-CM | POA: Diagnosis not present

## 2022-08-02 DIAGNOSIS — R059 Cough, unspecified: Secondary | ICD-10-CM | POA: Diagnosis present

## 2022-08-02 LAB — RESP PANEL BY RT-PCR (FLU A&B, COVID) ARPGX2
Influenza A by PCR: NEGATIVE
Influenza B by PCR: NEGATIVE
SARS Coronavirus 2 by RT PCR: POSITIVE — AB

## 2022-08-02 LAB — GROUP A STREP BY PCR: Group A Strep by PCR: NOT DETECTED

## 2022-08-02 MED ORDER — NIRMATRELVIR/RITONAVIR (PAXLOVID)TABLET: 3.0000 | ORAL_TABLET | Freq: Two times a day (BID) | ORAL | 0 refills | Status: AC

## 2022-08-02 NOTE — Discharge Instructions (Addendum)
Take half of your Vesicare while taking the antiviral medication and for 3 days after you finish it..  You also need to stop your trazodone and Lipitor while taking the antiviral medication.  Return to the emergency room if you have any worsening symptoms.

## 2022-08-02 NOTE — ED Triage Notes (Signed)
Patient here POV from Home  Endorses Multiple URI Symptoms including Productive Cough, Neck Pain, Aches, Subjective Fevers, Sore Throat for approximately 48 Hours.   NAD Noted during Triage. A&Ox4. GCS 15. Ambulatory.

## 2022-08-02 NOTE — ED Provider Notes (Signed)
[facility] DEPT Provider Note   CSN: [id number] Arrival date & time: 08/02/22  1627     History  Chief Complaint  Patient presents with   Cough    Jordan Willis is a 71 y.o. female.  Patient is a 71 year old female who presents with cough and congestion.  She recently was diagnosed with the flu and completed a course of Tamiflu.  This was about 10 days ago.  About 2 to 3 days ago she started having a headache some congestion and coughing and myalgias.  She feels a little bit short of breath.  No leg swelling.  No vomiting or diarrhea.  She has some ear congestion and pain.  She had some increased temperatures up to 99.       Home Medications Prior to Admission medications   Medication Sig Start Date End Date Taking? Authorizing Provider  nirmatrelvir/ritonavir EUA (PAXLOVID) 20 x 150 MG & 10 x '100MG'$  TABS Take 3 tablets by mouth 2 (two) times daily for 5 days. Patient GFR is normal. Take nirmatrelvir (150 mg) two tablets twice daily for 5 days and ritonavir (100 mg) one tablet twice daily for 5 days. 08/02/22 08/07/22 Yes Malvin Johns, MD  albuterol (VENTOLIN HFA) 108 (90 Base) MCG/ACT inhaler Inhale 8.5 puffs into the lungs as needed. 06/07/22   [provider]  anastrozole (ARIMIDEX) 1 MG tablet TAKE 1 TABLET BY MOUTH EVERY DAY 05/25/22   Nicholas Lose, MD  anastrozole (ARIMIDEX) 1 MG tablet Take 1 mg by mouth daily.    [provider]  Ascorbic Acid (VITAMIN C PO) Take 500 mg by mouth daily.    [provider]  atorvastatin (LIPITOR) 20 MG tablet TAKE 1 TABLET BY MOUTH EVERYDAY AT BEDTIME 03/12/22   Tolia, Sunit, DO  b complex vitamins capsule Take 1 capsule by mouth daily.    [provider]  bumetanide (BUMEX) 1 MG tablet TAKE 1 TABLET BY MOUTH EVERY DAY 07/23/22   Tolia, Sunit, DO  cetirizine (ZYRTEC) 10 MG tablet Take 10 mg by mouth in the morning.    [provider]  Cholecalciferol (VITAMIN D3) 25 MCG (1000  UT) CAPS Take 1,000 Units by mouth in the morning.    [provider]  exemestane (AROMASIN) 25 MG tablet Take 1 tablet (25 mg total) by mouth daily after breakfast. 07/08/22   Nicholas Lose, MD  FARXIGA 10 MG TABS tablet TAKE 1 TABLET BY MOUTH EVERY DAY BEFORE BREAKFAST 05/28/22   Tolia, Sunit, DO  fluticasone (FLONASE) 50 MCG/ACT nasal spray Place 1 spray into both nostrils in the morning. 12/28/20   [provider]  GLUCOSAMINE-CHONDROITIN PO Take 1 tablet by mouth every evening.     [provider]  glucose blood (ONETOUCH VERIO) test strip TEST ONCE DAILY AS DIRECTED 90 01/06/21   [provider]  ibuprofen (ADVIL) 200 MG tablet Take 400 mg by mouth every 6 (six) hours as needed (for pain.).    [provider]  metFORMIN (GLUCOPHAGE-XR) 500 MG 24 hr tablet Take 500 mg by mouth 2 (two) times daily. 05/24/21   [provider]  metoprolol succinate (TOPROL-XL) 25 MG 24 hr tablet TAKE 1 TABLET (25 MG TOTAL) BY MOUTH IN THE MORNING. HOLD IF SYSTOLIC BLOOD PRESSURE (TOP BLOOD PRESSURE NUMBER) LESS THAN 100 MMHG OR HEART RATE LESS THAN 60 BPM (PULSE). Patient taking differently: Take 25 mg by mouth daily. Hold if systolic blood pressure (top blood pressure number) is less than 100  MMHG or heart rate less than 60 BPM (pulse) 02/02/22   Tolia, Sunit, DO  Multiple Vitamin (MULTIVITAMIN WITH MINERALS) TABS tablet Take 1 tablet by mouth daily. Centrum Silver for Women 50+    [provider]  nitroGLYCERIN (NITROSTAT) 0.4 MG SL tablet Place 0.4 mg under the tongue as needed. 07/26/22   [provider]  Olopatadine HCl 0.2 % SOLN Place 1 drop into both eyes daily as needed (allergies and itching eyes). [place]    [provider]  ondansetron (ZOFRAN) 4 MG tablet Take 1 tablet (4 mg total) by mouth every 6 (six) hours as needed for nausea or vomiting. 07/22/22   Tretha Sciara, MD  ondansetron (ZOFRAN-ODT) 4 MG disintegrating tablet Take 4  mg by mouth every 6 (six) hours as needed. 07/22/22   [provider]  OneTouch Delica Lancets 16X MISC Apply topically. 10/08/20   [provider]  oseltamivir (TAMIFLU) 75 MG capsule Take 1 capsule (75 mg total) by mouth 2 (two) times daily. 07/22/22   Tretha Sciara, MD  Probiotic Product (PROBIOTIC DAILY PO) Take 1 capsule by mouth in the morning.    [provider]  sacubitril-valsartan (ENTRESTO) 49-51 MG Take 1 tablet by mouth 2 (two) times daily. 03/14/22   Tolia, Sunit, DO  sertraline (ZOLOFT) 50 MG tablet Take 50 mg by mouth in the morning.    [provider]  solifenacin (VESICARE) 10 MG tablet Take 10 mg by mouth in the morning.    [provider]  spironolactone (ALDACTONE) 25 MG tablet TAKE 1 TABLET BY MOUTH EVERY DAY IN THE MORNING 06/11/22   Tolia, Sunit, DO  traZODone (DESYREL) 50 MG tablet Take 50 mg by mouth at bedtime as needed for sleep.    [provider]      Allergies    Bactrim [sulfamethoxazole-trimethoprim], Aspirin, Sulfa antibiotics, and Tape    Review of Systems   Review of Systems  Constitutional:  Positive for fatigue and fever. Negative for chills and diaphoresis.  HENT:  Positive for congestion, rhinorrhea and sneezing.   Eyes: Negative.   Respiratory:  Positive for cough and shortness of breath. Negative for chest tightness.   Cardiovascular:  Negative for chest pain and leg swelling.  Gastrointestinal:  Negative for abdominal pain, blood in stool, diarrhea, nausea and vomiting.  Genitourinary:  Negative for difficulty urinating, flank pain, frequency and hematuria.  Musculoskeletal:  Positive for myalgias. Negative for arthralgias and back pain.  Skin:  Negative for rash.  Neurological:  Positive for headaches. Negative for dizziness, speech difficulty, weakness and numbness.    Physical Exam Updated Vital Signs BP 128/62   Pulse 73   Temp 98 F (36.7 C) (Oral)   Resp 18   Ht '5\' 7"'$  (1.702 m)    Wt 81.4 kg   SpO2 97%   BMI 28.11 kg/m  Physical Exam Constitutional:      Appearance: She is well-developed.  HENT:     Head: Normocephalic and atraumatic.     Right Ear: Tympanic membrane normal.     Left Ear: Tympanic membrane normal.     Nose: Congestion present.     Mouth/Throat:     Mouth: Mucous membranes are moist.  Eyes:     Pupils: Pupils are equal, round, and reactive to light.  Neck:     Comments: No meningismus Cardiovascular:     Rate and Rhythm: Normal rate and regular rhythm.     Heart sounds: Normal heart sounds.  Pulmonary:  Effort: Pulmonary effort is normal. No respiratory distress.     Breath sounds: Normal breath sounds. No wheezing or rales.  Chest:     Chest wall: No tenderness.  Abdominal:     General: Bowel sounds are normal.     Palpations: Abdomen is soft.     Tenderness: There is no abdominal tenderness. There is no guarding or rebound.  Musculoskeletal:        General: Normal range of motion.     Cervical back: Normal range of motion and neck supple.  Lymphadenopathy:     Cervical: No cervical adenopathy.  Skin:    General: Skin is warm and dry.     Findings: No rash.  Neurological:     Mental Status: She is alert and oriented to person, place, and time.     ED Results / Procedures / Treatments   Labs (all labs ordered are listed, but only abnormal results are displayed) Labs Reviewed  RESP PANEL BY RT-PCR (FLU A&B, COVID) ARPGX2 - Abnormal; Notable for the following components:      Result Value   SARS Coronavirus 2 by RT PCR POSITIVE (*)    All other components within normal limits  GROUP A STREP BY PCR    EKG None  Radiology No results found.  Procedures Procedures    Medications Ordered in ED Medications - No data to display  ED Course/ Medical Decision Making/ A&P                           Medical Decision Making  Patient is a 71 year old female who presents with cough and cold symptoms that started 2 to 3  days ago.  Her COVID test is positive.  Her lungs are clear without clinical suggestions of pneumonia.  She has no hypoxia.  No vomiting or signs of dehydration.  She is otherwise well-appearing.  I reviewed her chart and she has had an recent creatinine that was normal.  I discussed the patient with the pharmacist on-call who I consulted with.  She advises that Paxlovid is okay with her medications but she needs to hold 3 of her medications which I discussed with the patient.  I also wrote this on her discharge papers.  At this point I do not see any indication for hospitalization.  She was discharged home in good condition.  She was given symptomatic care instructions and given a prescription for Paxlovid.  Return precautions were given.  Final Clinical Impression(s) / ED Diagnoses Final diagnoses:  COVID-19 virus infection    Rx / DC Orders ED Discharge Orders          Ordered    nirmatrelvir/ritonavir EUA (PAXLOVID) 20 x 150 MG & 10 x '100MG'$  TABS  2 times daily        08/02/22 2100              Malvin Johns, MD 08/02/22 2113

## 2022-08-03 ENCOUNTER — Ambulatory Visit: Payer: Medicare Other | Admitting: Surgical

## 2022-08-03 ENCOUNTER — Ambulatory Visit: Payer: Medicare Other

## 2022-08-03 NOTE — Telephone Encounter (Signed)
Upon referral, [name], called patient to schedule counseling appointment. The client reported not feeling well and asked to be called back.   The counselor plans on calling the patient back next week.    [name],  Counseling Intern  [phone]

## 2022-08-06 ENCOUNTER — Ambulatory Visit: Payer: Medicare Other

## 2022-08-06 ENCOUNTER — Telehealth: Payer: Self-pay

## 2022-08-06 ENCOUNTER — Other Ambulatory Visit: Payer: Self-pay | Admitting: Cardiology

## 2022-08-06 NOTE — Telephone Encounter (Signed)
[name], counseling intern, made a follow-up call to patient. The patient reported they are still feeling sick with Covid but slowly recovering. The patient shared some of their recent health history. The patient shared their mother is in the hospital but also recovering. The patient shared their friend passed away two days ago and the patient is working through the grief.   The patient scheduled a counseling session for Monday, October 23rd at 2:30pm.   [name],  Counseling Intern  [phone]

## 2022-08-07 ENCOUNTER — Ambulatory Visit (HOSPITAL_BASED_OUTPATIENT_CLINIC_OR_DEPARTMENT_OTHER): Payer: Medicare Other

## 2022-08-08 ENCOUNTER — Encounter: Payer: Self-pay | Admitting: Cardiology

## 2022-08-08 ENCOUNTER — Ambulatory Visit: Payer: Medicaid Other | Admitting: Cardiology

## 2022-08-08 VITALS — BP 121/61 | HR 80 | Temp 96.4°F | Resp 16 | Ht 67.0 in | Wt 177.8 lb

## 2022-08-08 DIAGNOSIS — I447 Left bundle-branch block, unspecified: Secondary | ICD-10-CM | POA: Diagnosis not present

## 2022-08-08 DIAGNOSIS — E119 Type 2 diabetes mellitus without complications: Secondary | ICD-10-CM | POA: Diagnosis not present

## 2022-08-08 DIAGNOSIS — E782 Mixed hyperlipidemia: Secondary | ICD-10-CM

## 2022-08-08 DIAGNOSIS — I428 Other cardiomyopathies: Secondary | ICD-10-CM

## 2022-08-08 DIAGNOSIS — I10 Essential (primary) hypertension: Secondary | ICD-10-CM

## 2022-08-08 DIAGNOSIS — Z171 Estrogen receptor negative status [ER-]: Secondary | ICD-10-CM

## 2022-08-08 DIAGNOSIS — I5042 Chronic combined systolic (congestive) and diastolic (congestive) heart failure: Secondary | ICD-10-CM | POA: Diagnosis not present

## 2022-08-08 DIAGNOSIS — C50412 Malignant neoplasm of upper-outer quadrant of left female breast: Secondary | ICD-10-CM | POA: Diagnosis not present

## 2022-08-08 NOTE — Progress Notes (Signed)
Jordan Willis Date of Birth: November 30, 1950 MRN: [id number] Primary Care Provider:Pharr, Thayer Jew, MD  Former Cardiology Providers: Miquel Dunn, MSN, APRN, FNP-C Primary Cardiologist: Rex Kras, DO (established care 01/20/2020)  Date: 08/08/22 Last Office Visit: 04/09/2022  Chief Complaint  Patient presents with   Cardiomyopathy   Congestive Heart Failure   Follow-up   HPI  Jordan Willis is a 71 y.o.  female whose past medical history and cardiovascular risk factors include: Left bundle branch block, type 2 diabetes mellitus, hyperlipidemia, nonischemic cardiomyopathy, chronic combined systolic/diastolic heart failure, recurrent breast cancer status post bilateral mastectomies, postmenopausal female, advanced age.  At baseline her LVEF is 50-55%; however, after reoccurrence of breast cancer she was started on chemotherapy and her LVEF is being monitored.  Follow-up echo noted LVEF of 30 to 35% with global and regional wall motion abnormalities.  The regional wall motion normalities could be secondary to LBBB or CAD.  She did undergo left heart catheterization which noted normal epicardial coronary arteries and LVEF was consistent with nonischemic cardiomyopathy.  She was referred to EP for biventricular ICD.  However, patient decided to hold off.  Until recently she has decided to proceed forward and is scheduled to undergo implant later this month.  Since last office visit she has had an episode of flu followed by COVID-19 infection.  She is back to baseline, euvolemic, and no anginal discomfort.  She is accompanied by her daughter at today's office visit who also provides collateral history.  FUNCTIONAL STATUS: No structured exercise program or daily routine.  ALLERGIES: Allergies  Allergen Reactions   Bactrim [Sulfamethoxazole-Trimethoprim] Shortness Of Breath    Shortness of breath, difficulty breathing, headache, rash, fatigue   Aspirin Palpitations and Other (See  Comments)    Heart fluttering, pt takes 40.5 mg (half of 81 mg) daily Other reaction(s): bradycardia, Not available   Sulfa Antibiotics Hives   Tape Rash    Tears skin off   MEDICATION LIST PRIOR TO VISIT: Current Outpatient Medications on File Prior to Visit  Medication Sig Dispense Refill   albuterol (VENTOLIN HFA) 108 (90 Base) MCG/ACT inhaler Inhale 8.5 puffs into the lungs as needed.     anastrozole (ARIMIDEX) 1 MG tablet TAKE 1 TABLET BY MOUTH EVERY DAY 90 tablet 0   anastrozole (ARIMIDEX) 1 MG tablet Take 1 mg by mouth daily.     Ascorbic Acid (VITAMIN C PO) Take 500 mg by mouth daily.     atorvastatin (LIPITOR) 20 MG tablet TAKE 1 TABLET BY MOUTH EVERYDAY AT BEDTIME 90 tablet 1   b complex vitamins capsule Take 1 capsule by mouth daily.     cetirizine (ZYRTEC) 10 MG tablet Take 10 mg by mouth in the morning.     Cholecalciferol (VITAMIN D3) 25 MCG (1000 UT) CAPS Take 1,000 Units by mouth in the morning.     exemestane (AROMASIN) 25 MG tablet Take 1 tablet (25 mg total) by mouth daily after breakfast. 90 tablet 3   FARXIGA 10 MG TABS tablet TAKE 1 TABLET BY MOUTH EVERY DAY BEFORE BREAKFAST 90 tablet 0   fluticasone (FLONASE) 50 MCG/ACT nasal spray Place 1 spray into both nostrils in the morning.     GLUCOSAMINE-CHONDROITIN PO Take 1 tablet by mouth every evening.      glucose blood (ONETOUCH VERIO) test strip TEST ONCE DAILY AS DIRECTED 90     ibuprofen (ADVIL) 200 MG tablet Take 400 mg by mouth every 6 (six) hours as needed (for pain.).  metFORMIN (GLUCOPHAGE-XR) 500 MG 24 hr tablet Take 500 mg by mouth 2 (two) times daily.     metoprolol succinate (TOPROL-XL) 25 MG 24 hr tablet TAKE 1 TABLET (25 MG TOTAL) BY MOUTH IN THE MORNING. HOLD IF SYSTOLIC BLOOD PRESSURE (TOP BLOOD PRESSURE NUMBER) LESS THAN 100 MMHG OR HEART RATE LESS THAN 60 BPM (PULSE). (Patient taking differently: Take 25 mg by mouth daily. Hold if systolic blood pressure (top blood pressure number) is less than 100  MMHG or heart rate less than 60 BPM (pulse)) 90 tablet 3   Multiple Vitamin (MULTIVITAMIN WITH MINERALS) TABS tablet Take 1 tablet by mouth daily. Centrum Silver for Women 50+     nitroGLYCERIN (NITROSTAT) 0.4 MG SL tablet Place 0.4 mg under the tongue as needed.     Olopatadine HCl 0.2 % SOLN Place 1 drop into both eyes daily as needed (allergies and itching eyes). Pataday     ondansetron (ZOFRAN) 4 MG tablet Take 1 tablet (4 mg total) by mouth every 6 (six) hours as needed for nausea or vomiting. 20 tablet 0   ondansetron (ZOFRAN-ODT) 4 MG disintegrating tablet Take 4 mg by mouth every 6 (six) hours as needed.     OneTouch Delica Lancets 30N MISC Apply topically.     Probiotic Product (PROBIOTIC DAILY PO) Take 1 capsule by mouth in the morning.     sacubitril-valsartan (ENTRESTO) 49-51 MG Take 1 tablet by mouth 2 (two) times daily. 60 tablet 0   sertraline (ZOLOFT) 50 MG tablet Take 50 mg by mouth in the morning.     solifenacin (VESICARE) 10 MG tablet Take 10 mg by mouth in the morning.     spironolactone (ALDACTONE) 25 MG tablet TAKE 1 TABLET BY MOUTH EVERY DAY IN THE MORNING 90 tablet 0   traZODone (DESYREL) 50 MG tablet Take 50 mg by mouth at bedtime as needed for sleep.     bumetanide (BUMEX) 1 MG tablet TAKE 1 TABLET BY MOUTH EVERY DAY 90 tablet 1   No current facility-administered medications on file prior to visit.    PAST MEDICAL HISTORY: Past Medical History:  Diagnosis Date   Anemia    Anginal pain (HCC)    Anxiety    Arthritis    Lumbar spine DDD.   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   Diabetes mellitus without complication (HCC)    Type II   Dyspnea    When patient gets sick uses Pro-Air inhaler   Frequent sinus infections    GERD (gastroesophageal reflux disease)    occ   Goiter     History of cardiomegaly    History of cataract    Bilateral   History of kidney stones    08/28/2018 currently has a large kidney stone, asymptomatic at this time   History of migraine    History of uterine fibroid    History of uterine prolapse    Hyperlipidemia    Hypertension    LBBB (left bundle branch block)    Menopause 03/03/2012   Nasal turbinate hypertrophy 11/29/2016   Left inferior    Personal history of chemotherapy    Personal history of radiation therapy    Pneumonia    as a child   PONV (postoperative nausea and vomiting)    Sinusitis  2014   Sleep apnea    No CPAP   SUI (stress urinary incontinence, female)    SVD (spontaneous vaginal delivery)    x 2   Thyroid nodule     PAST SURGICAL HISTORY: Past Surgical History:  Procedure Laterality Date   ABDOMINAL HYSTERECTOMY     BILATERAL SALPINGOOPHORECTOMY Bilateral    BLADDER SUSPENSION N/A 05/16/2017   Procedure: TRANSVAGINAL TAPE (TVT) PROCEDURE;  Surgeon: Everett Graff, MD;  Location: [facility];  Service: Gynecology;  Laterality: N/A;   BREAST BIOPSY Right 2009   BREAST IMPLANT REMOVAL Bilateral 02/05/2022   Procedure: Removal bilateral breast implants;  Surgeon: [name], MD;  Location: [facility];  Service: Plastics;  Laterality: Bilateral;   BREAST LUMPECTOMY Right 2009   BREAST LUMPECTOMY Left 2006   BREAST RECONSTRUCTION WITH PLACEMENT OF TISSUE EXPANDER AND FLEX HD (ACELLULAR HYDRATED DERMIS) Bilateral 09/04/2021   Procedure: BILATERAL BREAST RECONSTRUCTION WITH PLACEMENT OF TISSUE EXPANDER AND FLEX HD (ACELLULAR HYDRATED DERMIS);  Surgeon: [name], MD;  Location: [facility];  Service: Plastics;  Laterality: Bilateral;   CARDIAC CATHETERIZATION     CATARACT EXTRACTION Left 10/23/2017   CATARACT EXTRACTION Right 11/06/2017   COLONOSCOPY  10/22/2009   Normal.  Repeat 5 years.  [name]   CYSTOSCOPY N/A 05/16/2017   Procedure: CYSTOSCOPY;  Surgeon: Everett Graff, MD;  Location: [facility];  Service:  Gynecology;  Laterality: N/A;   DEBRIDEMENT AND CLOSURE WOUND Bilateral 10/03/2021   Procedure: Debridement bilateral mastectomy flaps;  Surgeon: [name], MD;  Location: [facility];  Service: Plastics;  Laterality: Bilateral;  1.5 hour   EYE SURGERY     bilateral cataracts   LEFT HEART CATH AND CORONARY ANGIOGRAPHY N/A 02/09/2020   Procedure: LEFT HEART CATH AND CORONARY ANGIOGRAPHY;  Surgeon: Nigel Mormon, MD;  Location: [facility];  Service: Cardiovascular;  Laterality: N/A;   LEFT HEART CATH AND CORONARY ANGIOGRAPHY N/A 03/27/2022   Procedure: LEFT HEART CATH AND CORONARY ANGIOGRAPHY;  Surgeon: Adrian Prows, MD;  Location: [facility];  Service: Cardiovascular;  Laterality: N/A;   MASTECTOMY W/ SENTINEL NODE BIOPSY Right 09/04/2021   Procedure: BILATERAL MASTECTOMIES WITH RIGHT SENTINEL LYMPH NODE BIOPSY;  Surgeon: Stark Klein, MD;  Location: [facility];  Service: General;  Laterality: Right;   REMOVAL OF BILATERAL TISSUE EXPANDERS WITH PLACEMENT OF BILATERAL BREAST IMPLANTS Bilateral 10/03/2021   Procedure: REMOVAL OF BILATERAL TISSUE EXPANDERS WITH REPLACEMENT OF TISSUE EXPANDERS;  Surgeon: [name], MD;  Location: [facility];  Service: Plastics;  Laterality: Bilateral;   REMOVAL OF BILATERAL TISSUE EXPANDERS WITH PLACEMENT OF BILATERAL BREAST IMPLANTS Bilateral 12/19/2021   Procedure: REMOVAL OF BILATERAL TISSUE EXPANDERS WITH PLACEMENT OF BILATERAL BREAST IMPLANTS;  Surgeon: [name], MD;  Location: [facility];  Service: Plastics;  Laterality: Bilateral;   ROBOTIC ASSISTED TOTAL HYSTERECTOMY N/A 05/16/2017   Procedure: ROBOTIC ASSISTED TOTAL HYSTERECTOMY;  Surgeon: Delsa Bern, MD;  Location: [facility];  Service: Gynecology;  Laterality: N/A;   ROTATOR CUFF REPAIR Bilateral    SHOULDER ARTHROSCOPY WITH ROTATOR CUFF REPAIR AND SUBACROMIAL DECOMPRESSION Right 09/03/2018   Procedure: Right shoulder manipulation under anesthesia, exam under anesthesia, mini open rotator  cuff repair, subacromial decompression;  Surgeon: Susa Day, MD;  Location: [facility];  Service: Orthopedics;  Laterality: Right;  90 mins   TUBAL LIGATION     WISDOM TOOTH EXTRACTION      FAMILY HISTORY: The patient's family history includes Breast cancer in her sister and  sister; Cancer in her sister and sister; Cancer (age of onset: 35) in her mother; Diabetes in her father; Hypertension in her mother.   SOCIAL HISTORY:  The patient  reports that she has never smoked. She has never used smokeless tobacco. She reports that she does not drink alcohol and does not use drugs.  Review of Systems  Cardiovascular:  Negative for chest pain, dyspnea on exertion, leg swelling, orthopnea, palpitations, paroxysmal nocturnal dyspnea and syncope.  Respiratory:  Negative for cough and shortness of breath.     PHYSICAL EXAM:    08/08/2022    9:14 AM 08/02/2022    9:00 PM 08/02/2022    8:45 PM  Vitals with BMI  Height 5' 7"     Weight 177 lbs 13 oz    BMI 16.10    Systolic 960 454 098  Diastolic 61 57 62  Pulse 80 70 73   Physical Exam  Constitutional: No distress.  Age appropriate, hemodynamically stable.   Neck: No JVD present.  Cardiovascular: Normal rate, regular rhythm, S1 normal, S2 normal, intact distal pulses and normal pulses. Exam reveals no gallop, no S3 and no S4.  No murmur heard. Pulmonary/Chest: Effort normal and breath sounds normal. No stridor. She has no wheezes. She has no rales.  Bilateral mastectomies.  Abdominal: Soft. Bowel sounds are normal. She exhibits no distension. There is no abdominal tenderness.  Musculoskeletal:        General: No edema.     Cervical back: Neck supple.  Neurological: She is alert and oriented to person, place, and time. She has intact cranial nerves (2-12).  Skin: Skin is warm and moist.   CARDIAC DATABASE: EKG: 03/13/2022: Normal sinus rhythm, 64 bpm, LAE, LBBB.  Echocardiogram: 03/29/2021:  50-55%, G1DD, moderate MR, Moderate TR,  PASP 58mHG.   06/15/2021: LVEF 30-35%, severe hypokinesis of the anteroseptal and apex, moderate to severe LV dysfunction, grade 1 diastolic dysfunction, elevated LAP, global longitudinal strain -16.7%, mild to moderate Mr.   10/09/2021: 45-50%, no regional wall motion abnormalities, global longitudinal strain -13.2%, mild MR.   01/29/2022: 35-40%, average GLS -111.9% grade 1 diastolic impairment, severe hypokinesis of the left ventricle with regional wall motion abnormalities, moderately dilated left atrium, posterior pericardial effusion without tamponade, moderate to severe MR, estimated RAP 8 mmHg.  03/09/2022: LVEF 30-35%, average GLS -15.4%, mild MR, RAP 3 mmHg.  Stress Testing:  [facility] Achie[facility] 7 METS, normal blood pressure response to exercise, had exertional chest pain that improved/resolved during recovery, normal myocardial perfusion study no EKG abnormalities per report.  Heart Catheterization: Left Heart Catheterization 03/27/22:  LV: 118/3, EDP 11 mmHg.  Ao 128/56, mean 84 mmHg.  No pressure gradient across the aortic valve. LVEF 30 to 35% with global hypokinesis.  LV is mildly dilated. LM: Large caliber vessels, smooth and normal. LAD: Large vessel, gives origin to several small diagonals.  Smooth and normal.  Mid to distal segment is tortuous and may suggest hypertensive heart disease. RI: Large vessel, gives origin to large lateral branches.  Smooth and normal. CX: Large vessel, gives origin to moderate-sized OM1, 2 and continues as OM 3.  Smooth and normal. RCA: Dominant, continues as a PDA.  Smooth and normal.  Impression: Findings consistent with nonischemic dilated cardiomyopathy.  Severe LV systolic dysfunction.  MUGA scan 05/10/2022: 1. Left ventricular ejection fraction is equal to 35.3%. 2. Moderate to marked septal hypokinesis. 3. Left ventricular dilatation.   LABORATORY DATA:  Latest Ref Rng &  Units 08/01/2022    8:42 AM 03/28/2022    7:58 AM 03/27/2022    7:32 AM  CBC  WBC 3.4 - 10.8 x10E3/uL 5.0  3.8  4.7   Hemoglobin 11.1 - 15.9 g/dL 12.0  12.5  12.5   Hematocrit 34.0 - 46.6 % 35.9  39.0  39.5   Platelets 150 - 450 x10E3/uL 285  285  296        Latest Ref Rng & Units 08/01/2022    8:42 AM 03/28/2022    7:58 AM 03/27/2022    7:32 AM  CMP  Glucose 70 - 99 mg/dL 99  115  120   BUN 8 - 27 mg/dL 11  10  9    Creatinine 0.57 - 1.00 mg/dL 0.80  0.62  0.65   Sodium 134 - 144 mmol/L 139  138  140   Potassium 3.5 - 5.2 mmol/L 4.3  3.7  3.8   Chloride 96 - 106 mmol/L 101  106  107   CO2 20 - 29 mmol/L 26  24  24    Calcium 8.7 - 10.3 mg/dL 9.8  9.8  9.8   Total Protein 6.5 - 8.1 g/dL  7.9    Total Bilirubin 0.3 - 1.2 mg/dL  0.4    Alkaline Phos 38 - 126 U/L  89    AST 15 - 41 U/L  15    ALT 0 - 44 U/L  11      Lipid Panel: Outside records 06/24/2018: Total cholesterol 176, triglycerides 104, HDL 52, LDL 103  Lipid Panel  Lab Results  Component Value Date   CHOL 158 10/13/2020   HDL 44 10/13/2020   LDLCALC 94 10/13/2020   TRIG 107 10/13/2020   CHOLHDL 6.2 10/11/2013    Lab Results  Component Value Date   HGBA1C 7.2 (H) 08/01/2021   HGBA1C 6.1 (H) 08/28/2018   HGBA1C 6.8 (H) 10/11/2013   No components found for: "NTPROBNP" Lab Results  Component Value Date   TSH 0.599 10/11/2013   TSH 0.401 07/02/2012    Cardiac Panel (last 3 results) No results for input(s): "CKTOTAL", "CKMB", "TROPONINIHS", "RELINDX" in the last 72 hours.  External Labs: Collected: 09/26/2021 available in Care Everywhere. Sodium 147, potassium 4.6, chloride 107, bicarb 26, BUN 9, creatinine 0.62 AST 25, ALT 39, alkaline phosphatase 88 EGFR 106 Hemoglobin A1c 7.1 TSH 0.52  Collected 02/16/2021: Total cholesterol 182, triglycerides 118, HDL 44, LDL 114, non-HDL 117   FINAL MEDICATION LIST END OF ENCOUNTER: No orders of the defined types were placed in this encounter.   Current  Outpatient Medications:    albuterol (VENTOLIN HFA) 108 (90 Base) MCG/ACT inhaler, Inhale 8.5 puffs into the lungs as needed., Disp: , Rfl:    anastrozole (ARIMIDEX) 1 MG tablet, TAKE 1 TABLET BY MOUTH EVERY DAY, Disp: 90 tablet, Rfl: 0   anastrozole (ARIMIDEX) 1 MG tablet, Take 1 mg by mouth daily., Disp: , Rfl:    Ascorbic Acid (VITAMIN C PO), Take 500 mg by mouth daily., Disp: , Rfl:    atorvastatin (LIPITOR) 20 MG tablet, TAKE 1 TABLET BY MOUTH EVERYDAY AT BEDTIME, Disp: 90 tablet, Rfl: 1   b complex vitamins capsule, Take 1 capsule by mouth daily., Disp: , Rfl:    cetirizine (ZYRTEC) 10 MG tablet, Take 10 mg by mouth in the morning., Disp: , Rfl:    Cholecalciferol (VITAMIN D3) 25 MCG (1000 UT) CAPS, Take 1,000 Units by mouth in the morning.,  Disp: , Rfl:    exemestane (AROMASIN) 25 MG tablet, Take 1 tablet (25 mg total) by mouth daily after breakfast., Disp: 90 tablet, Rfl: 3   FARXIGA 10 MG TABS tablet, TAKE 1 TABLET BY MOUTH EVERY DAY BEFORE BREAKFAST, Disp: 90 tablet, Rfl: 0   fluticasone (FLONASE) 50 MCG/ACT nasal spray, Place 1 spray into both nostrils in the morning., Disp: , Rfl:    GLUCOSAMINE-CHONDROITIN PO, Take 1 tablet by mouth every evening. , Disp: , Rfl:    glucose blood (ONETOUCH VERIO) test strip, TEST ONCE DAILY AS DIRECTED 90, Disp: , Rfl:    ibuprofen (ADVIL) 200 MG tablet, Take 400 mg by mouth every 6 (six) hours as needed (for pain.)., Disp: , Rfl:    metFORMIN (GLUCOPHAGE-XR) 500 MG 24 hr tablet, Take 500 mg by mouth 2 (two) times daily., Disp: , Rfl:    metoprolol succinate (TOPROL-XL) 25 MG 24 hr tablet, TAKE 1 TABLET (25 MG TOTAL) BY MOUTH IN THE MORNING. HOLD IF SYSTOLIC BLOOD PRESSURE (TOP BLOOD PRESSURE NUMBER) LESS THAN 100 MMHG OR HEART RATE LESS THAN 60 BPM (PULSE). (Patient taking differently: Take 25 mg by mouth daily. Hold if systolic blood pressure (top blood pressure number) is less than 100 MMHG or heart rate less than 60 BPM (pulse)), Disp: 90 tablet, Rfl:  3   Multiple Vitamin (MULTIVITAMIN WITH MINERALS) TABS tablet, Take 1 tablet by mouth daily. Centrum Silver for Women 50+, Disp: , Rfl:    nitroGLYCERIN (NITROSTAT) 0.4 MG SL tablet, Place 0.4 mg under the tongue as needed., Disp: , Rfl:    Olopatadine HCl 0.2 % SOLN, Place 1 drop into both eyes daily as needed (allergies and itching eyes). Pataday, Disp: , Rfl:    ondansetron (ZOFRAN) 4 MG tablet, Take 1 tablet (4 mg total) by mouth every 6 (six) hours as needed for nausea or vomiting., Disp: 20 tablet, Rfl: 0   ondansetron (ZOFRAN-ODT) 4 MG disintegrating tablet, Take 4 mg by mouth every 6 (six) hours as needed., Disp: , Rfl:    OneTouch Delica Lancets 37S MISC, Apply topically., Disp: , Rfl:    Probiotic Product (PROBIOTIC DAILY PO), Take 1 capsule by mouth in the morning., Disp: , Rfl:    sacubitril-valsartan (ENTRESTO) 49-51 MG, Take 1 tablet by mouth 2 (two) times daily., Disp: 60 tablet, Rfl: 0   sertraline (ZOLOFT) 50 MG tablet, Take 50 mg by mouth in the morning., Disp: , Rfl:    solifenacin (VESICARE) 10 MG tablet, Take 10 mg by mouth in the morning., Disp: , Rfl:    spironolactone (ALDACTONE) 25 MG tablet, TAKE 1 TABLET BY MOUTH EVERY DAY IN THE MORNING, Disp: 90 tablet, Rfl: 0   traZODone (DESYREL) 50 MG tablet, Take 50 mg by mouth at bedtime as needed for sleep., Disp: , Rfl:    bumetanide (BUMEX) 1 MG tablet, TAKE 1 TABLET BY MOUTH EVERY DAY, Disp: 90 tablet, Rfl: 1  IMPRESSION:    ICD-10-CM   1. NICM (nonischemic cardiomyopathy) ([place])  I42.8 ECHOCARDIOGRAM COMPLETE    2. Chronic combined systolic and diastolic heart failure (HCC)  I50.42 ECHOCARDIOGRAM COMPLETE    3. LBBB (left bundle branch block)  I44.7     4. Essential hypertension  I10     5. Non-insulin dependent type 2 diabetes mellitus ([place])  E11.9     6. Mixed hyperlipidemia  E78.2     7. Malignant neoplasm of upper-outer quadrant of left breast in female, estrogen receptor negative ([place])  C50.412  Z17.1         RECOMMENDATIONS: KENEDI CILIA is a 71 y.o. female whose past medical history and cardiovascular risk factors include: Left bundle branch block, type 2 diabetes mellitus, hyperlipidemia, nonischemic cardiomyopathy, chronic combined systolic/diastolic heart failure, recurrent breast cancer status post bilateral mastectomies (currently on chemotherapy), postmenopausal female, advanced age.  NICM (nonischemic cardiomyopathy) ([place]) / Chronic combined systolic and diastolic heart failure (HCC) / LBBB (left bundle branch block) Stage B, NYHA class II Likely secondary to initiation of chemotherapy/underlying left bundle branch block. Left heart catheterization: Normal epicardial coronaries. MUGA scan: 35.3%. Currently on Bumex, Farxiga, Toprol-XL, Entresto, spironolactone. Strict I's and O's and daily weights We will repeat echocardiogram 90 days after implant of BiV ICD  Essential hypertension Office and home blood pressures are well controlled. Medications reconciled. No changes warranted at this time  Non-insulin dependent type 2 diabetes mellitus ([place]) Most recent hemoglobin A1c reviewed. Reemphasized importance of glycemic control  Mixed hyperlipidemia Currently on atorvastatin.   She denies myalgia or other side effects. Currently managed by primary care provider.  Malignant neoplasm of upper-outer quadrant of left breast in female, estrogen receptor negative ([place]) Currently being followed by oncology.  --Continue cardiac medications as reconciled in final medication list. --Return in about 4 months (around 12/06/2022) for Follow up cardiomyopathy, review echo results.. Or sooner if needed. --Continue follow-up with your primary care physician regarding the management of your other chronic comorbid conditions.  Patient's questions and concerns were addressed to her satisfaction. She voices understanding of the instructions provided during this encounter.   This note was created  using a voice recognition software as a result there may be grammatical errors inadvertently enclosed that do not reflect the nature of this encounter. Every attempt is made to correct such errors.   Rex Kras, [place], [facility]  Pager: [phone] Office: [phone]

## 2022-08-13 NOTE — Progress Notes (Signed)
Jordan Willis, counseling intern, met with client for their first counseling session.   The client shared their mother is being released from the hospital today into a rehab facility after being in the hospital for one month.   The client expressed they are the main caregiver for their mother and their children often tell her that she "fusses" over her mother too much. The patient expressed they often put their heart to their chest to calm themselves. The counselor taught the patient other grounding techniques.   The patient will be undergoing heart surgery on Friday. The counselor told they patient they would call her in a week to check in their progress and to make a follow-up counseling session.   Jordan Willis,  Counseling Intern  [phone]

## 2022-08-16 NOTE — Pre-Procedure Instructions (Signed)
Instructed patient on the following items: Arrival time 1300 Nothing to eat or drink after midnight No meds AM of procedure Responsible person to drive you home and stay with you for 24 hrs Wash with special soap night before and morning of procedure  

## 2022-08-17 ENCOUNTER — Ambulatory Visit (HOSPITAL_COMMUNITY): Admission: RE | Admit: 2022-08-17 | Payer: Medicare Other | Source: Home / Self Care | Admitting: Internal Medicine

## 2022-08-17 ENCOUNTER — Encounter (HOSPITAL_COMMUNITY): Admission: RE | Payer: Medicare Other | Source: Home / Self Care

## 2022-08-17 SURGERY — BIV ICD INSERTION CRT-D

## 2022-08-17 MED ORDER — DOXYCYCLINE HYCLATE 100 MG PO TABS: 100.0000 mg | ORAL_TABLET | Freq: Two times a day (BID) | ORAL | 0 refills | Status: AC

## 2022-08-17 NOTE — Telephone Encounter (Signed)
Patient called the office that she had some drainage from her left breast wound.  Reported it was white and green.  She is with her daughter.  She reports that she is having some fatigue, nausea and vomiting.  She reports that she did have COVID last week, seems to have recovered from this.  She is not having any fevers.  There is no redness of her left breast.  She reports that she is also having some loose stools.  She denies any other symptoms.  She was scheduled for cardiovascular surgery for ICD insertion, however this was canceled due to medical provider emergency.  I discussed with the patient I am unsure what is causing her symptoms, it may be unrelated to her breast, however will prescribe doxycycline for her given that greenish drainage from her left breast wound.  I would like her to follow-up in the office early next week for [place] to evaluate.  Discussed with patient evaluation in urgent care or emergency room this weekend if her symptoms worsen or she develops any new symptoms.  Patient and her daughter were understanding.  All of their questions were answered to their content.

## 2022-08-17 NOTE — Progress Notes (Addendum)
Called patient to inform her that her procedure today will have to be rescheduled.  Dr Tanna Furry schedule will not allow today due to emergencies in the lab.  The office will call her to reschedule the procedure.

## 2022-08-20 ENCOUNTER — Other Ambulatory Visit: Payer: Self-pay | Admitting: Cardiology

## 2022-08-20 DIAGNOSIS — I428 Other cardiomyopathies: Secondary | ICD-10-CM

## 2022-08-20 DIAGNOSIS — I10 Essential (primary) hypertension: Secondary | ICD-10-CM

## 2022-08-20 DIAGNOSIS — I429 Cardiomyopathy, unspecified: Secondary | ICD-10-CM

## 2022-08-20 DIAGNOSIS — I5042 Chronic combined systolic (congestive) and diastolic (congestive) heart failure: Secondary | ICD-10-CM

## 2022-08-21 DIAGNOSIS — E1165 Type 2 diabetes mellitus with hyperglycemia: Secondary | ICD-10-CM | POA: Diagnosis not present

## 2022-08-21 DIAGNOSIS — I10 Essential (primary) hypertension: Secondary | ICD-10-CM | POA: Diagnosis not present

## 2022-08-21 DIAGNOSIS — E7800 Pure hypercholesterolemia, unspecified: Secondary | ICD-10-CM | POA: Diagnosis not present

## 2022-08-22 ENCOUNTER — Ambulatory Visit (INDEPENDENT_AMBULATORY_CARE_PROVIDER_SITE_OTHER): Payer: Medicare Other | Admitting: Physician Assistant

## 2022-08-22 DIAGNOSIS — Z9013 Acquired absence of bilateral breasts and nipples: Secondary | ICD-10-CM

## 2022-08-22 DIAGNOSIS — Z923 Personal history of irradiation: Secondary | ICD-10-CM | POA: Diagnosis not present

## 2022-08-22 DIAGNOSIS — Z9889 Other specified postprocedural states: Secondary | ICD-10-CM

## 2022-08-22 DIAGNOSIS — S21002D Unspecified open wound of left breast, subsequent encounter: Secondary | ICD-10-CM | POA: Diagnosis not present

## 2022-08-22 NOTE — Progress Notes (Signed)
Referring Provider [name], MD [place],  [place] [address]   CC:  Chief Complaint  Patient presents with   Wound Check      Jordan Willis is an 71 y.o. female.  HPI: Patient is a very pleasant 71 year old female with PMH of breast cancer s/p reconstruction with implant exchange performed 12/19/2021 and subsequent removal of bilateral implants performed 02/05/2022 by [name] due to right sided exposure who presents to clinic for postoperative follow-up.  She has a history of radiation to bilateral breast.  She was last seen here in clinic on 07/31/2022.  At that time, she had been feeling improved from a mobility standpoint and had been placing Vaseline over her small left breast incisional wound.  Plan was for packing the wound with iodoform gauze daily.  Plan was also for her to consult with Dr. Lovena Le for input given that [name] is no longer with the clinic.  Wound was noted to be approximately 3 x 2 x 4 mm.  Today, patient is accompanied by her daughter at bedside.  They report that they stopped doing the iodoform gauze packing daily as it was simply falling out of her incisional crease.  She then reported having some worsening pain and drainage from left side last week for which she called into the clinic and was subsequently prescribed an antibiotic.  She reports that since then it has improved and she is no longer having that left-sided discomfort.  She presents for evaluation of her incisional wound.  She feels as though it has gotten smaller since last encounter and had been improving prior to this new onset discomfort.    Allergies  Allergen Reactions   Bactrim [Sulfamethoxazole-Trimethoprim] Shortness Of Breath    Shortness of breath, difficulty breathing, headache, rash, fatigue   Aspirin Palpitations and Other (See Comments)    Heart fluttering, pt takes 40.5 mg (half of 81 mg) daily Other reaction(s): bradycardia, Not available   Sulfa  Antibiotics Hives   Tape Rash    Tears skin off    Outpatient Encounter Medications as of 08/22/2022  Medication Sig   albuterol (VENTOLIN HFA) 108 (90 Base) MCG/ACT inhaler Inhale 2 puffs into the lungs every 6 (six) hours as needed for wheezing or shortness of breath.   anastrozole (ARIMIDEX) 1 MG tablet TAKE 1 TABLET BY MOUTH EVERY DAY   Ascorbic Acid (VITAMIN C PO) Take 500 mg by mouth daily.   atorvastatin (LIPITOR) 20 MG tablet TAKE 1 TABLET BY MOUTH EVERYDAY AT BEDTIME   b complex vitamins capsule Take 1 capsule by mouth daily.   bumetanide (BUMEX) 1 MG tablet TAKE 1 TABLET BY MOUTH EVERY DAY   cetirizine (ZYRTEC) 10 MG tablet Take 10 mg by mouth in the morning.   Cholecalciferol (VITAMIN D3) 25 MCG (1000 UT) CAPS Take 1,000 Units by mouth in the morning.   doxycycline (VIBRA-TABS) 100 MG tablet Take 1 tablet (100 mg total) by mouth 2 (two) times daily for 5 days.   exemestane (AROMASIN) 25 MG tablet Take 1 tablet (25 mg total) by mouth daily after breakfast.   FARXIGA 10 MG TABS tablet TAKE 1 TABLET BY MOUTH EVERY DAY BEFORE BREAKFAST   fluticasone (FLONASE) 50 MCG/ACT nasal spray Place 1 spray into both nostrils in the morning.   GLUCOSAMINE-CHONDROITIN PO Take 1 tablet by mouth every evening.    glucose blood (ONETOUCH VERIO) test strip TEST ONCE DAILY AS DIRECTED 90   ibuprofen (ADVIL) 200  MG tablet Take 400 mg by mouth every 6 (six) hours as needed for mild pain or moderate pain.   metFORMIN (GLUCOPHAGE-XR) 500 MG 24 hr tablet Take 500 mg by mouth 2 (two) times daily.   metoprolol succinate (TOPROL-XL) 25 MG 24 hr tablet TAKE 1 TABLET (25 MG TOTAL) BY MOUTH IN THE MORNING. HOLD IF SYSTOLIC BLOOD PRESSURE (TOP BLOOD PRESSURE NUMBER) LESS THAN 100 MMHG OR HEART RATE LESS THAN 60 BPM (PULSE). (Patient taking differently: Take 25 mg by mouth daily. Hold if systolic blood pressure (top blood pressure number) is less than 100 MMHG or heart rate less than 60 BPM (pulse))   Multiple  Vitamin (MULTIVITAMIN WITH MINERALS) TABS tablet Take 1 tablet by mouth daily. Centrum Silver for Women 50+   nitroGLYCERIN (NITROSTAT) 0.4 MG SL tablet Place 0.4 mg under the tongue every 5 (five) minutes as needed for chest pain.   Olopatadine HCl 0.2 % SOLN Place 1 drop into both eyes daily. Pataday   ondansetron (ZOFRAN) 4 MG tablet Take 1 tablet (4 mg total) by mouth every 6 (six) hours as needed for nausea or vomiting.   ondansetron (ZOFRAN-ODT) 4 MG disintegrating tablet Take 4 mg by mouth every 6 (six) hours as needed.   OneTouch Delica Lancets 49Q MISC Apply topically.   Probiotic Product (PROBIOTIC DAILY PO) Take 1 capsule by mouth in the morning.   sacubitril-valsartan (ENTRESTO) 49-51 MG Take 1 tablet by mouth 2 (two) times daily.   sertraline (ZOLOFT) 50 MG tablet Take 50 mg by mouth in the morning.   solifenacin (VESICARE) 10 MG tablet Take 10 mg by mouth in the morning.   spironolactone (ALDACTONE) 25 MG tablet TAKE 1 TABLET BY MOUTH EVERY DAY IN THE MORNING   traZODone (DESYREL) 50 MG tablet Take 50 mg by mouth at bedtime as needed for sleep.   No facility-administered encounter medications on file as of 08/22/2022.     Past Medical History:  Diagnosis Date   Anemia    Anginal pain (HCC)    Anxiety    Arthritis    Lumbar spine DDD.   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   Diabetes mellitus without complication (HCC)    Type II   Dyspnea    When patient gets sick uses Pro-Air inhaler   Frequent sinus infections    GERD (gastroesophageal reflux disease)    occ   Goiter    History of cardiomegaly    History of cataract    Bilateral   History of kidney stones    08/28/2018 currently has a large kidney stone, asymptomatic at this time   History of migraine    History of uterine fibroid     History of uterine prolapse    Hyperlipidemia    Hypertension    LBBB (left bundle branch block)    Menopause 03/03/2012   Nasal turbinate hypertrophy 11/29/2016   Left inferior    Personal history of chemotherapy    Personal history of radiation therapy    Pneumonia    as a child   PONV (postoperative nausea and vomiting)    Sinusitis 2014   Sleep apnea    No CPAP   SUI (stress urinary incontinence, female)    SVD (spontaneous vaginal delivery)    x 2  Thyroid nodule     Past Surgical History:  Procedure Laterality Date   ABDOMINAL HYSTERECTOMY     BILATERAL SALPINGOOPHORECTOMY Bilateral    BLADDER SUSPENSION N/A 05/16/2017   Procedure: TRANSVAGINAL TAPE (TVT) PROCEDURE;  Surgeon: Everett Graff, MD;  Location: [facility];  Service: Gynecology;  Laterality: N/A;   BREAST BIOPSY Right 2009   BREAST IMPLANT REMOVAL Bilateral 02/05/2022   Procedure: Removal bilateral breast implants;  Surgeon: [name], MD;  Location: [facility];  Service: Plastics;  Laterality: Bilateral;   BREAST LUMPECTOMY Right 2009   BREAST LUMPECTOMY Left 2006   BREAST RECONSTRUCTION WITH PLACEMENT OF TISSUE EXPANDER AND FLEX HD (ACELLULAR HYDRATED DERMIS) Bilateral 09/04/2021   Procedure: BILATERAL BREAST RECONSTRUCTION WITH PLACEMENT OF TISSUE EXPANDER AND FLEX HD (ACELLULAR HYDRATED DERMIS);  Surgeon: [name], MD;  Location: [facility];  Service: Plastics;  Laterality: Bilateral;   CARDIAC CATHETERIZATION     CATARACT EXTRACTION Left 10/23/2017   CATARACT EXTRACTION Right 11/06/2017   COLONOSCOPY  10/22/2009   Normal.  Repeat 5 years.  [name]   CYSTOSCOPY N/A 05/16/2017   Procedure: CYSTOSCOPY;  Surgeon: Everett Graff, MD;  Location: [facility];  Service: Gynecology;  Laterality: N/A;   DEBRIDEMENT AND CLOSURE WOUND Bilateral 10/03/2021   Procedure: Debridement bilateral mastectomy flaps;  Surgeon: [name], MD;  Location: [facility];  Service: Plastics;  Laterality: Bilateral;  1.5 hour   EYE  SURGERY     bilateral cataracts   LEFT HEART CATH AND CORONARY ANGIOGRAPHY N/A 02/09/2020   Procedure: LEFT HEART CATH AND CORONARY ANGIOGRAPHY;  Surgeon: Nigel Mormon, MD;  Location: [facility];  Service: Cardiovascular;  Laterality: N/A;   LEFT HEART CATH AND CORONARY ANGIOGRAPHY N/A 03/27/2022   Procedure: LEFT HEART CATH AND CORONARY ANGIOGRAPHY;  Surgeon: Adrian Prows, MD;  Location: [facility];  Service: Cardiovascular;  Laterality: N/A;   MASTECTOMY W/ SENTINEL NODE BIOPSY Right 09/04/2021   Procedure: BILATERAL MASTECTOMIES WITH RIGHT SENTINEL LYMPH NODE BIOPSY;  Surgeon: Stark Klein, MD;  Location: [facility];  Service: General;  Laterality: Right;   REMOVAL OF BILATERAL TISSUE EXPANDERS WITH PLACEMENT OF BILATERAL BREAST IMPLANTS Bilateral 10/03/2021   Procedure: REMOVAL OF BILATERAL TISSUE EXPANDERS WITH REPLACEMENT OF TISSUE EXPANDERS;  Surgeon: [name], MD;  Location: [facility];  Service: Plastics;  Laterality: Bilateral;   REMOVAL OF BILATERAL TISSUE EXPANDERS WITH PLACEMENT OF BILATERAL BREAST IMPLANTS Bilateral 12/19/2021   Procedure: REMOVAL OF BILATERAL TISSUE EXPANDERS WITH PLACEMENT OF BILATERAL BREAST IMPLANTS;  Surgeon: [name], MD;  Location: [facility];  Service: Plastics;  Laterality: Bilateral;   ROBOTIC ASSISTED TOTAL HYSTERECTOMY N/A 05/16/2017   Procedure: ROBOTIC ASSISTED TOTAL HYSTERECTOMY;  Surgeon: Delsa Bern, MD;  Location: [facility];  Service: Gynecology;  Laterality: N/A;   ROTATOR CUFF REPAIR Bilateral    SHOULDER ARTHROSCOPY WITH ROTATOR CUFF REPAIR AND SUBACROMIAL DECOMPRESSION Right 09/03/2018   Procedure: Right shoulder manipulation under anesthesia, exam under anesthesia, mini open rotator cuff repair, subacromial decompression;  Surgeon: Susa Day, MD;  Location: [facility];  Service: Orthopedics;  Laterality: Right;  90 mins   TUBAL LIGATION     WISDOM TOOTH EXTRACTION      Family History  Problem Relation Age of Onset    Cancer Mother 95       breast cancer   Hypertension Mother    Diabetes Father    Cancer Sister        breast cancer   Breast  cancer Sister    Cancer Sister        breast cancer   Breast cancer Sister     Social History   Social History Narrative   Marital status: divorced; dating seriously x many years.       Children:  2 children; 1 grandchild.      Employment: Personnel officer.      Lives: alone      Tobacco: none       Alcohol: none      Exercise:  Walking with sister two days per week.      Seatbelt: 100%      Guns: none      Right-handed   Caffeine: occasional coffee, hot tea or Chenell Lozon tea a few times per week     Review of Systems General: Denies fevers or chills.  Denies ongoing pain. Skin: Denies any considerable ongoing drainage or surrounding redness  Physical Exam    08/08/2022    9:14 AM 08/02/2022    9:00 PM 08/02/2022    8:45 PM  Vitals with BMI  Height '5\' 7"'$     Weight 177 lbs 13 oz    BMI 02.63    Systolic 785 885 027  Diastolic 61 57 62  Pulse 80 70 73    General:  No acute distress, nontoxic appearing  Respiratory: No increased work of breathing Neuro: Alert and oriented Psychiatric: Normal mood and affect  Skin: She has two left-sided incisional wounds.  Her chronic wound had gotten smaller, approximately 2 x 2 mm.  However, there is a new incisional wound medially approximately 4 x 4 mm.  No drainage noted.  No surrounding erythema.  No induration, crepitus, or fluctuance.  Assessment/Plan  Discussed case with Dr. Lovena Le who personally evaluated patient at bedside.  Given her reported and objective improvement of her incisional wound, recommend continued conservative management.  However, she is also developed a new (similarly pinpoint) wound just medially.  Each of them are nondraining and without any concern for infection at this time.  Recommend that she continue her course of antibiotics until it is completed.  Vaseline and gauze for  conservative management with the hope that it will eventually heal over, particularly given that she has had improvement.  Engaged in lengthy discussion about the damage he incurred from radiation and how it affects wound healing in that area.  If this problem continues to persist or worsen, she will be referred to [facility] or [facility] for tissue transfer of healthy tissue, likely from abdomen.  She will call clinic should she have any new or worsening symptoms.   Picture(s) obtained of the patient and placed in the chart were with the patient's or guardian's permission.   Krista Blue 08/22/2022, 9:32 AM

## 2022-08-23 ENCOUNTER — Other Ambulatory Visit: Payer: Self-pay

## 2022-08-23 ENCOUNTER — Telehealth: Payer: Self-pay

## 2022-08-23 ENCOUNTER — Encounter (HOSPITAL_COMMUNITY): Payer: Self-pay | Admitting: Gastroenterology

## 2022-08-23 MED ORDER — ENTRESTO 49-51 MG PO TABS: 1.0000 | ORAL_TABLET | Freq: Two times a day (BID) | ORAL | 1 refills | Status: DC

## 2022-08-23 NOTE — Telephone Encounter (Signed)
Jordan Willis, counseling intern, called the patient to check in on them after their heart surgery. The patient shared that their surgery was postponed day of for tomorrow. The patient reported they are feeling overwhelmed by their responsibilities and appointments.   The counselor taught the patient box breathing - breathe in for four counts, hold for four counts, breathe out for four counts, hold for four counts, repeat.   The counselor plans on calling the patient next week.   [name],  Counseling Intern  [phone] [email or web address]

## 2022-08-23 NOTE — Pre-Procedure Instructions (Signed)
Instructed patient on the following items: Arrival time 0930 Nothing to eat or drink after midnight No meds AM of procedure Responsible person to drive you home and stay with you for 24 hrs Wash with special soap night before and morning of procedure  

## 2022-08-24 ENCOUNTER — Other Ambulatory Visit: Payer: Self-pay

## 2022-08-24 ENCOUNTER — Observation Stay (HOSPITAL_COMMUNITY)
Admission: RE | Admit: 2022-08-24 | Discharge: 2022-08-25 | Disposition: A | Discharge status: Home / Self Care | Payer: Medicare Other | Attending: Internal Medicine | Admitting: Internal Medicine

## 2022-08-24 ENCOUNTER — Encounter (HOSPITAL_COMMUNITY)
Admission: RE | Disposition: A | Discharge status: Home / Self Care | Payer: Self-pay | Source: Home / Self Care | Attending: Internal Medicine

## 2022-08-24 DIAGNOSIS — Z9221 Personal history of antineoplastic chemotherapy: Secondary | ICD-10-CM | POA: Insufficient documentation

## 2022-08-24 DIAGNOSIS — I429 Cardiomyopathy, unspecified: Secondary | ICD-10-CM

## 2022-08-24 DIAGNOSIS — I447 Left bundle-branch block, unspecified: Secondary | ICD-10-CM | POA: Insufficient documentation

## 2022-08-24 DIAGNOSIS — I5022 Chronic systolic (congestive) heart failure: Principal | ICD-10-CM | POA: Insufficient documentation

## 2022-08-24 DIAGNOSIS — I428 Other cardiomyopathies: Secondary | ICD-10-CM | POA: Insufficient documentation

## 2022-08-24 DIAGNOSIS — R001 Bradycardia, unspecified: Secondary | ICD-10-CM | POA: Diagnosis present

## 2022-08-24 DIAGNOSIS — I2510 Atherosclerotic heart disease of native coronary artery without angina pectoris: Secondary | ICD-10-CM | POA: Insufficient documentation

## 2022-08-24 DIAGNOSIS — I700 Atherosclerosis of aorta: Secondary | ICD-10-CM | POA: Diagnosis not present

## 2022-08-24 DIAGNOSIS — Z853 Personal history of malignant neoplasm of breast: Secondary | ICD-10-CM | POA: Diagnosis not present

## 2022-08-24 HISTORY — PX: BIV ICD INSERTION CRT-D: EP1195

## 2022-08-24 LAB — GLUCOSE, CAPILLARY
Glucose-Capillary: 90 mg/dL (ref 70–99)
Glucose-Capillary: 87 mg/dL (ref 70–99)

## 2022-08-24 SURGERY — BIV ICD INSERTION CRT-D

## 2022-08-24 MED ORDER — POVIDONE-IODINE 10 % EX SWAB: 2.0000 | Freq: Once | CUTANEOUS | Status: DC

## 2022-08-24 MED ORDER — SPIRONOLACTONE 25 MG PO TABS
25.0000 mg | ORAL_TABLET | Freq: Every day | ORAL | Status: DC
  Administered 2022-08-24 – 2022-08-25 (×2): 25 mg via ORAL
  Filled 2022-08-24 (×2): qty 1

## 2022-08-24 MED ORDER — SODIUM CHLORIDE 0.9 % IV SOLN: INTRAVENOUS | Status: DC

## 2022-08-24 MED ORDER — SACUBITRIL-VALSARTAN 49-51 MG PO TABS
1.0000 | ORAL_TABLET | Freq: Two times a day (BID) | ORAL | Status: DC
  Administered 2022-08-24 – 2022-08-25 (×2): 1 via ORAL
  Filled 2022-08-24 (×3): qty 1

## 2022-08-24 MED ORDER — LIDOCAINE HCL (PF) 1 % IJ SOLN
INTRAMUSCULAR | Status: AC
  Filled 2022-08-24: qty 60

## 2022-08-24 MED ORDER — SODIUM CHLORIDE 0.9 % IV SOLN
INTRAVENOUS | Status: AC
  Filled 2022-08-24: qty 2

## 2022-08-24 MED ORDER — SENNA 8.6 MG PO TABS: 1.0000 | ORAL_TABLET | Freq: Once | ORAL | Status: DC

## 2022-08-24 MED ORDER — METOPROLOL SUCCINATE ER 25 MG PO TB24
25.0000 mg | ORAL_TABLET | Freq: Every day | ORAL | Status: DC
  Administered 2022-08-24 – 2022-08-25 (×2): 25 mg via ORAL
  Filled 2022-08-24 (×2): qty 1

## 2022-08-24 MED ORDER — FENTANYL CITRATE (PF) 100 MCG/2ML IJ SOLN
INTRAMUSCULAR | Status: DC | PRN
  Administered 2022-08-24 (×3): 12.5 ug via INTRAVENOUS
  Administered 2022-08-24 (×2): 25 ug via INTRAVENOUS

## 2022-08-24 MED ORDER — CEFAZOLIN SODIUM-DEXTROSE 1-4 GM/50ML-% IV SOLN
1.0000 g | Freq: Four times a day (QID) | INTRAVENOUS | Status: AC
  Administered 2022-08-24 – 2022-08-25 (×3): 1 g via INTRAVENOUS
  Filled 2022-08-24 (×3): qty 50

## 2022-08-24 MED ORDER — MIDAZOLAM HCL 5 MG/5ML IJ SOLN
INTRAMUSCULAR | Status: DC | PRN
  Administered 2022-08-24: 2 mg via INTRAVENOUS
  Administered 2022-08-24 (×4): 1 mg via INTRAVENOUS

## 2022-08-24 MED ORDER — FENTANYL CITRATE (PF) 100 MCG/2ML IJ SOLN
INTRAMUSCULAR | Status: AC
  Filled 2022-08-24: qty 2

## 2022-08-24 MED ORDER — FENTANYL CITRATE PF 50 MCG/ML IJ SOSY
25.0000 ug | PREFILLED_SYRINGE | INTRAMUSCULAR | Status: DC | PRN
  Administered 2022-08-24: 25 ug via INTRAVENOUS
  Filled 2022-08-24: qty 1

## 2022-08-24 MED ORDER — CEFAZOLIN SODIUM-DEXTROSE 2-4 GM/100ML-% IV SOLN
INTRAVENOUS | Status: AC
  Filled 2022-08-24: qty 100

## 2022-08-24 MED ORDER — BUMETANIDE 1 MG PO TABS
1.0000 mg | ORAL_TABLET | Freq: Every day | ORAL | Status: DC
  Administered 2022-08-24 – 2022-08-25 (×2): 1 mg via ORAL
  Filled 2022-08-24 (×2): qty 1

## 2022-08-24 MED ORDER — IODIXANOL 320 MG/ML IV SOLN
INTRAVENOUS | Status: DC | PRN
  Administered 2022-08-24: 30 mL

## 2022-08-24 MED ORDER — CEFAZOLIN SODIUM-DEXTROSE 2-4 GM/100ML-% IV SOLN
2.0000 g | INTRAVENOUS | Status: AC
  Administered 2022-08-24: 2 g via INTRAVENOUS

## 2022-08-24 MED ORDER — MIDAZOLAM HCL 5 MG/5ML IJ SOLN
INTRAMUSCULAR | Status: AC
  Filled 2022-08-24: qty 5

## 2022-08-24 MED ORDER — HEPARIN (PORCINE) IN NACL 1000-0.9 UT/500ML-% IV SOLN
INTRAVENOUS | Status: DC | PRN
  Administered 2022-08-24: 500 mL

## 2022-08-24 MED ORDER — SODIUM CHLORIDE 0.9 % IV SOLN
80.0000 mg | INTRAVENOUS | Status: AC
  Administered 2022-08-24: 80 mg

## 2022-08-24 MED ORDER — CHLORHEXIDINE GLUCONATE 4 % EX LIQD: 4.0000 | Freq: Once | CUTANEOUS | Status: DC

## 2022-08-24 MED ORDER — ONDANSETRON HCL 4 MG/2ML IJ SOLN: 4.0000 mg | Freq: Four times a day (QID) | INTRAMUSCULAR | Status: DC | PRN

## 2022-08-24 MED ORDER — FENTANYL CITRATE (PF) 100 MCG/2ML IJ SOLN
25.0000 ug | INTRAMUSCULAR | Status: DC | PRN
  Administered 2022-08-24 (×2): 25 ug via INTRAVENOUS

## 2022-08-24 MED ORDER — LIDOCAINE HCL (PF) 1 % IJ SOLN
INTRAMUSCULAR | Status: DC | PRN
  Administered 2022-08-24: 60 mL

## 2022-08-24 MED ORDER — DAPAGLIFLOZIN PROPANEDIOL 5 MG PO TABS
5.0000 mg | ORAL_TABLET | Freq: Every day | ORAL | Status: DC
  Administered 2022-08-25: 5 mg via ORAL
  Filled 2022-08-24 (×2): qty 1

## 2022-08-24 MED ORDER — HEPARIN (PORCINE) IN NACL 1000-0.9 UT/500ML-% IV SOLN
INTRAVENOUS | Status: AC
  Filled 2022-08-24: qty 500

## 2022-08-24 MED ORDER — BISACODYL 5 MG PO TBEC
5.0000 mg | DELAYED_RELEASE_TABLET | Freq: Every day | ORAL | Status: DC | PRN
  Administered 2022-08-24: 5 mg via ORAL
  Filled 2022-08-24: qty 1

## 2022-08-24 MED ORDER — TRAZODONE HCL 50 MG PO TABS: 50.0000 mg | ORAL_TABLET | Freq: Every evening | ORAL | Status: DC | PRN

## 2022-08-24 MED ORDER — ACETAMINOPHEN 325 MG PO TABS: 325.0000 mg | ORAL_TABLET | ORAL | Status: DC | PRN

## 2022-08-24 SURGICAL SUPPLY — 24 items
CABLE SURGICAL S-101-97-12 (CABLE) ×1 IMPLANT
CATH CPS DIRECT 135 DS2C020 (CATHETERS) IMPLANT
CATH CPS DIRECT ST DS2C018 (CATHETERS) IMPLANT
CATH CPS LOCATOR 3D MED (CATHETERS) IMPLANT
CATH HEX JOS 2-5-2 65CM 6F REP (CATHETERS) IMPLANT
CATH RIGHTSITE C315HIS02 (CATHETERS) IMPLANT
HELIX LOCKING TOOL (MISCELLANEOUS) ×1
ICD UNIFY ASUR CRT CD3357-40Q (ICD Generator) IMPLANT
KIT MICROPUNCTURE NIT STIFF (SHEATH) IMPLANT
LEAD DURATA 7122Q-58CM (Lead) IMPLANT
LEAD SELECT SECURE 3830 383069 (Lead) IMPLANT
LEAD ULTIPACE 46 LPA1231/46 (Lead) IMPLANT
LEAD ULTIPACE 58 LPA1231/58 (Lead) IMPLANT
PAD DEFIB RADIO PHYSIO CONN (PAD) ×1 IMPLANT
SELECT SECURE 3830 383069 (Lead) ×1 IMPLANT
SHEATH 7FR PRELUDE SNAP 13 (SHEATH) IMPLANT
SHEATH 8FR PRELUDE SNAP 13 (SHEATH) IMPLANT
SHEATH 9.5FR PRELUDE SNAP 13 (SHEATH) IMPLANT
SHEATH PROBE COVER 6X72 (BAG) IMPLANT
SLITTER 6232ADJ (MISCELLANEOUS) IMPLANT
SLITTER AGILIS HISPRO (INSTRUMENTS) IMPLANT
TOOL HELIX LOCKING (MISCELLANEOUS) IMPLANT
TRAY PACEMAKER INSERTION (PACKS) ×1 IMPLANT
WIRE HI TORQ VERSACORE-J 145CM (WIRE) IMPLANT

## 2022-08-24 NOTE — Discharge Instructions (Signed)
After Your ICD (Implantable Cardiac Defibrillator)   You have a [employer] ICD  ACTIVITY Do not lift your arm above shoulder height for 1 week after your procedure. After 7 days, you may progress as below.  You should remove your sling 24 hours after your procedure, unless otherwise instructed by your provider.     Friday August 31, 2022  Saturday September 01, 2022 Sunday September 02, 2022 Monday September 03, 2022   Do not lift, push, pull, or carry anything over 10 pounds with the affected arm until 6 weeks (Friday October 05, 2022 ) after your procedure.   You may drive AFTER your wound check, unless you have been told otherwise by your provider.   Ask your healthcare provider when you can go back to work   INCISION/Dressing If you are on a blood thinner such as Coumadin, Xarelto, Eliquis, Plavix, or Pradaxa please confirm with your provider when this should be resumed.   If large square, outer bandage is left in place, this can be removed after 24 hours from your procedure. Do not remove steri-strips or glue as below.   Monitor your defibrillator site for redness, swelling, and drainage. Call the device clinic at [phone] if you experience these symptoms or fever/chills.  If your incision is sealed with Steri-strips or staples, you may shower 7 days after your procedure or when told by your provider. Do not remove the steri-strips or let the shower hit directly on your site. You may wash around your site with soap and water.    If you were discharged in a sling, please do not wear this during the day more than 48 hours after your surgery unless otherwise instructed. This may increase the risk of stiffness and soreness in your shoulder.   Avoid lotions, ointments, or perfumes over your incision until it is well-healed.  You may use a hot tub or a pool AFTER your wound check appointment if the incision is completely closed.  Your ICD is designed to protect you from life  threatening heart rhythms. Because of this, you may receive a shock.   1 shock with no symptoms:  Call the office during business hours. 1 shock with symptoms (chest pain, chest pressure, dizziness, lightheadedness, shortness of breath, overall feeling unwell):  Call 911. If you experience 2 or more shocks in 24 hours:  Call 911. If you receive a shock, you should not drive for 6 months per the [place] DMV IF you receive appropriate therapy from your ICD.   ICD Alerts:  Some alerts are vibratory and others beep. These are NOT emergencies. Please call our office to let us know. If this occurs at night or on weekends, it can wait until the next business day. Send a remote transmission.  If your device is capable of reading fluid status (for heart failure), you will be offered monthly monitoring to review this with you.   DEVICE MANAGEMENT Remote monitoring is used to monitor your ICD from home. This monitoring is scheduled every 91 days by our office. It allows [place] to keep an eye on the functioning of your device to ensure it is working properly. You will routinely see your Electrophysiologist annually (more often if necessary).   You should receive your ID card for your new device in 4-8 weeks. Keep this card with you at all times once received. Consider wearing a medical alert bracelet or necklace.  Your ICD  may be MRI compatible. This will be discussed at your next  office visit/wound check.  You should avoid contact with strong electric or magnetic fields.   Do not use amateur (ham) radio equipment or electric (arc) welding torches. MP3 player headphones with magnets should not be used. Some devices are safe to use if held at least 12 inches (30 cm) from your defibrillator. These include power tools, lawn mowers, and speakers. If you are unsure if something is safe to use, ask your health care provider.  When using your cell phone, hold it to the ear that is on the opposite side from the  defibrillator. Do not leave your cell phone in a pocket over the defibrillator.  You may safely use electric blankets, heating pads, computers, and microwave ovens.  Call the office right away if: You have chest pain. You feel more than one shock. You feel more short of breath than you have felt before. You feel more light-headed than you have felt before. Your incision starts to open up.  This information is not intended to replace advice given to you by your health care provider. Make sure you discuss any questions you have with your health care provider.

## 2022-08-24 NOTE — Discharge Summary (Incomplete)
ELECTROPHYSIOLOGY PROCEDURE DISCHARGE SUMMARY    Patient ID: Jordan Willis,  MRN: [id number], DOB/AGE: [birth year] 71 y.o.  Admit date: 08/24/2022 Discharge date: 08/25/2022  Primary Care Physician: [name], MD  Primary Cardiologist: None  Electrophysiologist: Dr. Lovena Le    Primary Diagnosis:  Chronic systolic CHF   Secondary Diagnosis: LBBB  Allergies  Allergen Reactions   Bactrim [Sulfamethoxazole-Trimethoprim] Shortness Of Breath    Shortness of breath, difficulty breathing, headache, rash, fatigue   Aspirin Palpitations and Other (See Comments)    Heart fluttering, pt takes 40.5 mg (half of 81 mg) daily Other reaction(s): bradycardia, Not available   Sulfa Antibiotics Hives   Tape Rash    Tears skin off     Procedures This Admission:  1.  Implantation of a Abbott BiV ICD on 08/24/22 by Dr. Lovena Le.  The patient received a Abbott Unify Assura Crt [id number]  with Abbott Ultipace 1231-46 right atrial lead and Abbott Durata 7122 right ventricular lead. Pt received a Medtronic 3830 LV lead. DFTs were deferred at time of implant There were no post procedure complications 2.  CXR on 08/25/2022 demonstrated no pneumothorax status post device implantation.      Brief HPI: Jordan Willis is a 71 y.o. female was referred to electrophysiology in the outpatient setting  for consideration of ICD implantation.  Past medical history includes above.  The patient has persistent LV dysfunction despite guideline directed therapy.  Risks, benefits, and alternatives to ICD implantation were reviewed with the patient who wished to proceed.   Hospital Course:  The patient was admitted and underwent implantation of a  Abbott  BiV ICD with details as outlined above. They were monitored on telemetry overnight which demonstrated appropriate pacing .  Left chest was without hematoma or ecchymosis.  The device was interrogated and found to be functioning normally.  CXR was obtained and demonstrated  no pneumothorax status post device implantation..  Chest x-ray did raise the question of a 2.3 cm pleural-based lesion that was not seen on the previous image.  Chest CT obtained on 08/25/2022 showed no evidence of pulmonary or pleural abnormality to suggest lung mass.  Wound care, arm mobility, and restrictions were reviewed with the patient.  The patient was examined and considered stable for discharge to home.   The patient's discharge medications include an ACE-I/ARB/ARNI Delene Loll) and beta blocker (Toprol).   Anticoagulation resumption This patient is not on anticoagulation.  Physical Exam: Vitals:   08/25/22 0016 08/25/22 0449 08/25/22 0900 08/25/22 1115  BP: (!) 118/56 118/60 (!) 124/56 107/61  Pulse: 69 76    Resp: '16 18  18  '$ Temp: 98.2 F (36.8 C) 97.9 F (36.6 C)  97.7 F (36.5 C)  TempSrc: Oral Oral  Oral  SpO2: 93% 93%    Weight:      Height:        GEN- The patient is well appearing, alert and oriented x 3 today.   HEENT: normocephalic, atraumatic; sclera clear, conjunctiva pink; hearing intact; oropharynx clear; neck supple, no JVP Lymph- no cervical lymphadenopathy Lungs- Clear to ausculation bilaterally, normal work of breathing.  No wheezes, rales, rhonchi Heart- Regular rate and rhythm, no murmurs, rubs or gallops, PMI not laterally displaced GI- soft, non-tender, non-distended, bowel sounds present, no hepatosplenomegaly Extremities- no clubbing, cyanosis, or edema; DP/PT/radial pulses 2+ bilaterally MS- no significant deformity or atrophy Skin- warm and dry, no rash or lesion. ICD site clean, dry, without active bleeding Psych- euthymic mood, full affect  Neuro- strength and sensation are intact   Labs:   Lab Results  Component Value Date   WBC 5.0 08/01/2022   HGB 12.0 08/01/2022   HCT 35.9 08/01/2022   MCV 87 08/01/2022   PLT 285 08/01/2022   No results for input(s): "NA", "K", "CL", "CO2", "BUN", "CREATININE", "CALCIUM", "PROT", "BILITOT", "ALKPHOS",  "ALT", "AST", "GLUCOSE" in the last 168 hours.  Invalid input(s): "LABALBU"  Discharge Medications:  Allergies as of 08/25/2022       Reactions   Bactrim [sulfamethoxazole-trimethoprim] Shortness Of Breath   Shortness of breath, difficulty breathing, headache, rash, fatigue   Aspirin Palpitations, Other (See Comments)   Heart fluttering, pt takes 40.5 mg (half of 81 mg) daily Other reaction(s): bradycardia, Not available   Sulfa Antibiotics Hives   Tape Rash   Tears skin off        Medication List     TAKE these medications    albuterol 108 (90 Base) MCG/ACT inhaler Commonly known as: VENTOLIN HFA Inhale 2 puffs into the lungs every 6 (six) hours as needed for wheezing or shortness of breath.   anastrozole 1 MG tablet Commonly known as: ARIMIDEX TAKE 1 TABLET BY MOUTH EVERY DAY   atorvastatin 20 MG tablet Commonly known as: LIPITOR TAKE 1 TABLET BY MOUTH EVERYDAY AT BEDTIME   b complex vitamins capsule Take 1 capsule by mouth daily.   bumetanide 1 MG tablet Commonly known as: BUMEX TAKE 1 TABLET BY MOUTH EVERY DAY   cetirizine 10 MG tablet Commonly known as: ZYRTEC Take 10 mg by mouth in the morning.   Entresto 49-51 MG Generic drug: sacubitril-valsartan Take 1 tablet by mouth 2 (two) times daily.   exemestane 25 MG tablet Commonly known as: AROMASIN Take 1 tablet (25 mg total) by mouth daily after breakfast.   Farxiga 10 MG Tabs tablet Generic drug: dapagliflozin propanediol TAKE 1 TABLET BY MOUTH EVERY DAY BEFORE BREAKFAST   fluticasone 50 MCG/ACT nasal spray Commonly known as: FLONASE Place 1 spray into both nostrils in the morning.   GLUCOSAMINE-CHONDROITIN PO Take 1 tablet by mouth every evening.   ibuprofen 200 MG tablet Commonly known as: ADVIL Take 400 mg by mouth every 6 (six) hours as needed for mild pain or moderate pain.   metFORMIN 500 MG 24 hr tablet Commonly known as: GLUCOPHAGE-XR Take 500 mg by mouth 2 (two) times daily.    metoprolol succinate 25 MG 24 hr tablet Commonly known as: TOPROL-XL TAKE 1 TABLET (25 MG TOTAL) BY MOUTH IN THE MORNING. HOLD IF SYSTOLIC BLOOD PRESSURE (TOP BLOOD PRESSURE NUMBER) LESS THAN 100 MMHG OR HEART RATE LESS THAN 60 BPM (PULSE). What changed: See the new instructions.   multivitamin with minerals Tabs tablet Take 1 tablet by mouth daily. Centrum Silver for Women 50+   nitroGLYCERIN 0.4 MG SL tablet Commonly known as: NITROSTAT Place 0.4 mg under the tongue every 5 (five) minutes as needed for chest pain.   Olopatadine HCl 0.2 % Soln Place 1 drop into both eyes daily. Pataday   ondansetron 4 MG disintegrating tablet Commonly known as: ZOFRAN-ODT Take 4 mg by mouth every 6 (six) hours as needed.   ondansetron 4 MG tablet Commonly known as: ZOFRAN Take 1 tablet (4 mg total) by mouth every 6 (six) hours as needed for nausea or vomiting.   OneTouch Delica Lancets 52D Misc Apply topically.   OneTouch Verio test strip Generic drug: glucose blood TEST ONCE DAILY AS DIRECTED 90   PROBIOTIC DAILY PO  Take 1 capsule by mouth in the morning.   sertraline 50 MG tablet Commonly known as: ZOLOFT Take 50 mg by mouth in the morning.   solifenacin 10 MG tablet Commonly known as: VESICARE Take 10 mg by mouth in the morning.   spironolactone 25 MG tablet Commonly known as: ALDACTONE TAKE 1 TABLET BY MOUTH EVERY DAY IN THE MORNING   traZODone 50 MG tablet Commonly known as: DESYREL Take 50 mg by mouth at bedtime as needed for sleep.   VITAMIN C PO Take 500 mg by mouth daily.   Vitamin D3 25 MCG (1000 UT) Caps Take 1,000 Units by mouth in the morning.        Disposition: Discharge [facility] Follow up on 08/29/2022.   Specialty: Cardiology Why: 9:20AM. Wound check Contact information: [address] [place]        Evans Lance, MD Follow up on 11/23/2022.   Specialty: Cardiology Why: 1:45PM. Device follow up Contact information: [address] [place] [address] [phone]                 Duration of Discharge Encounter: Greater than 30 minutes including physician time.  Hilbert Corrigan, [place]  08/25/2022 11:51 AM  I have seen and examined this patient with Almyra Deforest.  Agree with above, note added to reflect my findings.  On exam, RRR, no murmurs, lungs clear.  She is now status post CRT-D for CHF.  Device functioning appropriately.  Chest x-ray and interrogation without issue.  Plan for discharge today with follow-up in device clinic.  Stefan Karen M. Elidia Bonenfant MD 08/25/2022 1:41 PM

## 2022-08-24 NOTE — H&P (Signed)
HPI Jordan Willis is referred by Dr. Terri Skains for consideration for biv device insertion. She has a h/o breast CA, s/p resection and chemotherapy. She has developed a non-ischemic CM and LBBB associated with her treatment. She has LV dysfunction with an EF of 30% and no obstructive CAD. SHe has class 2 symptoms and her QRS duration is 150 ms. With LBBB. She has not had syncope. She has been on GDMT.       Allergies  Allergen Reactions   Bactrim [Sulfamethoxazole-Trimethoprim] Shortness Of Breath      Shortness of breath, difficulty breathing, headache, rash, fatigue   Aspirin Palpitations and Other (See Comments)      Heart fluttering, pt takes 40.5 mg (half of 81 mg) daily   Sulfa Antibiotics Hives   Tape Rash      Tears skin off              Current Outpatient Medications  Medication Sig Dispense Refill   anastrozole (ARIMIDEX) 1 MG tablet TAKE 1 TABLET BY MOUTH EVERY DAY 90 tablet 0   Ascorbic Acid (VITAMIN C PO) Take 500 mg by mouth daily.       atorvastatin (LIPITOR) 20 MG tablet TAKE 1 TABLET BY MOUTH EVERYDAY AT BEDTIME 90 tablet 1   b complex vitamins capsule Take 1 capsule by mouth daily.       bumetanide (BUMEX) 1 MG tablet TAKE 1 TABLET BY MOUTH EVERY DAY 30 tablet 1   cetirizine (ZYRTEC) 10 MG tablet Take 10 mg by mouth in the morning.       Cholecalciferol (VITAMIN D3) 25 MCG (1000 UT) CAPS Take 1,000 Units by mouth in the morning.       FARXIGA 10 MG TABS tablet TAKE 1 TABLET BY MOUTH EVERY DAY BEFORE BREAKFAST 90 tablet 0   fluticasone (FLONASE) 50 MCG/ACT nasal spray Place 1 spray into both nostrils in the morning.       GLUCOSAMINE-CHONDROITIN PO Take 1 tablet by mouth every evening.        glucose blood (ONETOUCH VERIO) test strip TEST ONCE DAILY AS DIRECTED 90       ibuprofen (ADVIL) 200 MG tablet Take 400 mg by mouth every 6 (six) hours as needed (for pain.).       metFORMIN (GLUCOPHAGE-XR) 500 MG 24 hr tablet Take 500 mg by mouth 2 (two) times daily.        metoprolol succinate (TOPROL-XL) 25 MG 24 hr tablet TAKE 1 TABLET (25 MG TOTAL) BY MOUTH IN THE MORNING. HOLD IF SYSTOLIC BLOOD PRESSURE (TOP BLOOD PRESSURE NUMBER) LESS THAN 100 MMHG OR HEART RATE LESS THAN 60 BPM (PULSE). (Patient taking differently: Take 25 mg by mouth daily. Hold if systolic blood pressure (top blood pressure number) is less than 100 MMHG or heart rate less than 60 BPM (pulse)) 90 tablet 3   Multiple Vitamin (MULTIVITAMIN WITH MINERALS) TABS tablet Take 1 tablet by mouth daily. Centrum Silver for Women 50+       Olopatadine HCl 0.2 % SOLN Place 1 drop into both eyes daily as needed (allergies and itching eyes). Pataday       OneTouch Delica Lancets 67H MISC Apply topically.       Probiotic Product (PROBIOTIC DAILY PO) Take 1 capsule by mouth in the morning.       sacubitril-valsartan (ENTRESTO) 49-51 MG Take 1 tablet by mouth 2 (two) times daily. 60 tablet 0   sertraline (ZOLOFT) 50  MG tablet Take 50 mg by mouth in the morning.       solifenacin (VESICARE) 10 MG tablet Take 10 mg by mouth in the morning.       spironolactone (ALDACTONE) 25 MG tablet TAKE 1 TABLET BY MOUTH EVERY DAY IN THE MORNING 90 tablet 0   traZODone (DESYREL) 50 MG tablet Take 50 mg by mouth at bedtime as needed for sleep.        No current facility-administered medications for this visit.            Past Medical History:  Diagnosis Date   Anemia     Anginal pain (HCC)     Anxiety     Arthritis      Lumbar spine DDD.   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   Diabetes mellitus without complication (HCC)      Type II   Dyspnea      When patient gets sick uses Pro-Air inhaler   Frequent sinus infections     GERD (gastroesophageal reflux disease)      occ   Goiter     History of cardiomegaly     History of  cataract      Bilateral   History of kidney stones      08/28/2018 currently has a large kidney stone, asymptomatic at this time   History of migraine     History of uterine fibroid     History of uterine prolapse     Hyperlipidemia     Hypertension     LBBB (left bundle branch block)     Menopause 03/03/2012   Nasal turbinate hypertrophy 11/29/2016    Left inferior    Personal history of chemotherapy     Personal history of radiation therapy     Pneumonia      as a child   PONV (postoperative nausea and vomiting)     Sinusitis 2014   Sleep apnea      No CPAP   SUI (stress urinary incontinence, female)     SVD (spontaneous vaginal delivery)      x 2   Thyroid nodule        ROS:    All systems reviewed and negative except as noted in the HPI.          Past Surgical History:  Procedure Laterality Date   ABDOMINAL HYSTERECTOMY       BILATERAL SALPINGOOPHORECTOMY Bilateral     BLADDER SUSPENSION N/A 05/16/2017    Procedure: TRANSVAGINAL TAPE (TVT) PROCEDURE;  Surgeon: Everett Graff, MD;  Location: [facility];  Service: Gynecology;  Laterality: N/A;   BREAST BIOPSY Right 2009   BREAST IMPLANT REMOVAL Bilateral 02/05/2022    Procedure: Removal bilateral breast implants;  Surgeon: [name], MD;  Location: [facility];  Service: Plastics;  Laterality: Bilateral;   BREAST LUMPECTOMY Right 2009   BREAST LUMPECTOMY Left 2006   BREAST RECONSTRUCTION WITH PLACEMENT OF TISSUE EXPANDER AND FLEX HD (ACELLULAR HYDRATED DERMIS) Bilateral 09/04/2021    Procedure: BILATERAL BREAST RECONSTRUCTION WITH PLACEMENT OF TISSUE EXPANDER AND FLEX HD (ACELLULAR HYDRATED DERMIS);  Surgeon: [name], MD;  Location: [facility];  Service: Plastics;  Laterality: Bilateral;   CARDIAC CATHETERIZATION  CATARACT EXTRACTION Left 10/23/2017   CATARACT EXTRACTION Right 11/06/2017   COLONOSCOPY   10/22/2009    Normal.  Repeat 5 years.  [name]   CYSTOSCOPY N/A 05/16/2017    Procedure: CYSTOSCOPY;   Surgeon: Everett Graff, MD;  Location: [facility];  Service: Gynecology;  Laterality: N/A;   DEBRIDEMENT AND CLOSURE WOUND Bilateral 10/03/2021    Procedure: Debridement bilateral mastectomy flaps;  Surgeon: [name], MD;  Location: [facility];  Service: Plastics;  Laterality: Bilateral;  1.5 hour   EYE SURGERY        bilateral cataracts   LEFT HEART CATH AND CORONARY ANGIOGRAPHY N/A 02/09/2020    Procedure: LEFT HEART CATH AND CORONARY ANGIOGRAPHY;  Surgeon: Nigel Mormon, MD;  Location: [facility];  Service: Cardiovascular;  Laterality: N/A;   LEFT HEART CATH AND CORONARY ANGIOGRAPHY N/A 03/27/2022    Procedure: LEFT HEART CATH AND CORONARY ANGIOGRAPHY;  Surgeon: Adrian Prows, MD;  Location: [facility];  Service: Cardiovascular;  Laterality: N/A;   MASTECTOMY W/ SENTINEL NODE BIOPSY Right 09/04/2021    Procedure: BILATERAL MASTECTOMIES WITH RIGHT SENTINEL LYMPH NODE BIOPSY;  Surgeon: Stark Klein, MD;  Location: [facility];  Service: General;  Laterality: Right;   REMOVAL OF BILATERAL TISSUE EXPANDERS WITH PLACEMENT OF BILATERAL BREAST IMPLANTS Bilateral 10/03/2021    Procedure: REMOVAL OF BILATERAL TISSUE EXPANDERS WITH REPLACEMENT OF TISSUE EXPANDERS;  Surgeon: [name], MD;  Location: [facility];  Service: Plastics;  Laterality: Bilateral;   REMOVAL OF BILATERAL TISSUE EXPANDERS WITH PLACEMENT OF BILATERAL BREAST IMPLANTS Bilateral 12/19/2021    Procedure: REMOVAL OF BILATERAL TISSUE EXPANDERS WITH PLACEMENT OF BILATERAL BREAST IMPLANTS;  Surgeon: [name], MD;  Location: [facility];  Service: Plastics;  Laterality: Bilateral;   ROBOTIC ASSISTED TOTAL HYSTERECTOMY N/A 05/16/2017    Procedure: ROBOTIC ASSISTED TOTAL HYSTERECTOMY;  Surgeon: Delsa Bern, MD;  Location: [facility];  Service: Gynecology;  Laterality: N/A;   ROTATOR CUFF REPAIR Bilateral     SHOULDER ARTHROSCOPY WITH ROTATOR CUFF REPAIR AND SUBACROMIAL DECOMPRESSION Right 09/03/2018    Procedure: Right shoulder  manipulation under anesthesia, exam under anesthesia, mini open rotator cuff repair, subacromial decompression;  Surgeon: Susa Day, MD;  Location: [facility];  Service: Orthopedics;  Laterality: Right;  90 mins   TUBAL LIGATION       WISDOM TOOTH EXTRACTION                 Family History  Problem Relation Age of Onset   Cancer Mother 1        breast cancer   Hypertension Mother     Diabetes Father     Cancer Sister          breast cancer   Breast cancer Sister     Cancer Sister          breast cancer   Breast cancer Sister          Social History         Socioeconomic History   Marital status: Divorced      Spouse name: n/a   Number of children: 2   Years of education: 12   Highest education level: Not on file  Occupational History   Occupation: Retired      Fish farm manager: [employer]      Comment: [employer]  Tobacco Use   Smoking status: Never   Smokeless tobacco: Never  Vaping Use   Vaping Use: Never used  Substance and  Sexual Activity   Alcohol use: No   Drug use: No   Sexual activity: Not Currently      Partners: Male      Birth control/protection: Surgical, Post-menopausal      Comment: Hysterectomy  Other Topics Concern   Not on file  Social History Narrative    Marital status: divorced; dating seriously x many years.        Children:  2 children; 1 grandchild.       Employment: Personnel officer.       Lives: alone       Tobacco: none        Alcohol: none       Exercise:  Walking with sister two days per week.       Seatbelt: 100%       Guns: none         Right-handed    Caffeine: occasional coffee, hot tea or green tea a few times per week    Social Determinants of Health    Financial Resource Strain: Not on file  Food Insecurity: Not on file  Transportation Needs: Not on file  Physical Activity: Not on file  Stress: Not on file  Social Connections: Not on file  Intimate Partner Violence: Not on file        BP 126/64   Pulse  91   Ht '5\' 7"'$  (1.702 m)   Wt 181 lb 3.2 oz (82.2 kg)   SpO2 96%   BMI 28.38 kg/m    Physical Exam:   Well appearing NAD HEENT: Unremarkable Neck:  No JVD, no thyromegally Lymphatics:  No adenopathy Back:  No CVA tenderness Lungs:  Clear with no wheezes HEART:  Regular rate rhythm, no murmurs, no rubs, no clicks Abd:  soft, positive bowel sounds, no organomegally, no rebound, no guarding Ext:  2 plus pulses, no edema, no cyanosis, no clubbing Skin:  No rashes no nodules Neuro:  CN II through XII intact, motor grossly intact   EKG - NSR with LBBB   Assess/Plan:  Chronic systolic heart failure due to non-ischemic CM - I have discussed the treatment options with the patient and the risks/benefts/goals/expectations of biv ICD vs biv PPM insertion and she will call us if she wishes to proceed.   Carleene Overlie Daney Moor,MD

## 2022-08-25 ENCOUNTER — Observation Stay (HOSPITAL_COMMUNITY): Payer: Medicare Other

## 2022-08-25 DIAGNOSIS — M47814 Spondylosis without myelopathy or radiculopathy, thoracic region: Secondary | ICD-10-CM | POA: Diagnosis not present

## 2022-08-25 DIAGNOSIS — R918 Other nonspecific abnormal finding of lung field: Secondary | ICD-10-CM | POA: Diagnosis not present

## 2022-08-25 DIAGNOSIS — I428 Other cardiomyopathies: Secondary | ICD-10-CM | POA: Diagnosis not present

## 2022-08-25 DIAGNOSIS — I447 Left bundle-branch block, unspecified: Secondary | ICD-10-CM | POA: Diagnosis not present

## 2022-08-25 DIAGNOSIS — Z9221 Personal history of antineoplastic chemotherapy: Secondary | ICD-10-CM | POA: Diagnosis not present

## 2022-08-25 DIAGNOSIS — Z01818 Encounter for other preprocedural examination: Secondary | ICD-10-CM | POA: Diagnosis not present

## 2022-08-25 DIAGNOSIS — I2510 Atherosclerotic heart disease of native coronary artery without angina pectoris: Secondary | ICD-10-CM | POA: Diagnosis not present

## 2022-08-25 DIAGNOSIS — I5022 Chronic systolic (congestive) heart failure: Secondary | ICD-10-CM | POA: Diagnosis not present

## 2022-08-25 DIAGNOSIS — Z853 Personal history of malignant neoplasm of breast: Secondary | ICD-10-CM | POA: Diagnosis not present

## 2022-08-25 DIAGNOSIS — I771 Stricture of artery: Secondary | ICD-10-CM | POA: Diagnosis not present

## 2022-08-25 DIAGNOSIS — I700 Atherosclerosis of aorta: Secondary | ICD-10-CM | POA: Diagnosis not present

## 2022-08-25 LAB — GLUCOSE, CAPILLARY
Glucose-Capillary: 111 mg/dL — ABNORMAL HIGH (ref 70–99)
Glucose-Capillary: 87 mg/dL (ref 70–99)

## 2022-08-25 MED ORDER — TRAMADOL HCL 50 MG PO TABS
50.0000 mg | ORAL_TABLET | Freq: Once | ORAL | Status: AC
  Administered 2022-08-25: 50 mg via ORAL
  Filled 2022-08-25: qty 1

## 2022-08-25 MED ORDER — TRAMADOL HCL 50 MG PO TABS: 50.0000 mg | ORAL_TABLET | Freq: Two times a day (BID) | ORAL | Status: DC | PRN

## 2022-08-25 NOTE — Plan of Care (Signed)

## 2022-08-27 ENCOUNTER — Encounter (HOSPITAL_COMMUNITY): Payer: Self-pay | Admitting: Internal Medicine

## 2022-08-27 ENCOUNTER — Telehealth: Payer: Self-pay | Admitting: Internal Medicine

## 2022-08-27 NOTE — Telephone Encounter (Signed)
Called patient to advise to use ice over ICD implant area with barrier between skin and ice. Also may use tylenol every 6-8 hours as needed for pain. Patient voiced understanding and appreciative of call.

## 2022-08-27 NOTE — Telephone Encounter (Signed)
Pt states she is in pain from her Icd implant and she was told she would be prescribed some pain meds but she doesn't have any. Please advise

## 2022-08-28 ENCOUNTER — Encounter (HOSPITAL_COMMUNITY): Payer: Self-pay | Admitting: Internal Medicine

## 2022-08-29 ENCOUNTER — Ambulatory Visit (INDEPENDENT_AMBULATORY_CARE_PROVIDER_SITE_OTHER): Payer: Medicare Other | Admitting: Plastic Surgery

## 2022-08-29 ENCOUNTER — Telehealth: Payer: Self-pay

## 2022-08-29 ENCOUNTER — Ambulatory Visit: Payer: Medicare Other

## 2022-08-29 ENCOUNTER — Encounter: Payer: Self-pay | Admitting: Plastic Surgery

## 2022-08-29 DIAGNOSIS — Z171 Estrogen receptor negative status [ER-]: Secondary | ICD-10-CM

## 2022-08-29 DIAGNOSIS — L905 Scar conditions and fibrosis of skin: Secondary | ICD-10-CM

## 2022-08-29 NOTE — Progress Notes (Signed)
Referring Provider Jordan Pretty, MD [place],  [place] [address]   CC:  Chief Complaint  Patient presents with   Follow-up      Jordan Willis is an 71 y.o. female.  HPI: Jordan Willis returns today for evaluation of her left mastectomy scar.  She has had a small opening on the lateral portion of the incision which has been managed conservatively with wound care.  She notes that it has continued to get smaller and she has little or no pain and little or no drainage at this time.  Of note she just underwent placement of a pacemaker this week.  Allergies  Allergen Reactions   Bactrim [Sulfamethoxazole-Trimethoprim] Shortness Of Breath    Shortness of breath, difficulty breathing, headache, rash, fatigue   Aspirin Palpitations and Other (See Comments)    Heart fluttering, pt takes 40.5 mg (half of 81 mg) daily Other reaction(s): bradycardia, Not available   Sulfa Antibiotics Hives   Tape Rash    Tears skin off    Outpatient Encounter Medications as of 08/29/2022  Medication Sig   albuterol (VENTOLIN HFA) 108 (90 Base) MCG/ACT inhaler Inhale 2 puffs into the lungs every 6 (six) hours as needed for wheezing or shortness of breath.   anastrozole (ARIMIDEX) 1 MG tablet TAKE 1 TABLET BY MOUTH EVERY DAY   Ascorbic Acid (VITAMIN C PO) Take 500 mg by mouth daily.   atorvastatin (LIPITOR) 20 MG tablet TAKE 1 TABLET BY MOUTH EVERYDAY AT BEDTIME   b complex vitamins capsule Take 1 capsule by mouth daily.   bumetanide (BUMEX) 1 MG tablet TAKE 1 TABLET BY MOUTH EVERY DAY   cetirizine (ZYRTEC) 10 MG tablet Take 10 mg by mouth in the morning.   Cholecalciferol (VITAMIN D3) 25 MCG (1000 UT) CAPS Take 1,000 Units by mouth in the morning.   exemestane (AROMASIN) 25 MG tablet Take 1 tablet (25 mg total) by mouth daily after breakfast.   FARXIGA 10 MG TABS tablet TAKE 1 TABLET BY MOUTH EVERY DAY BEFORE BREAKFAST   fluticasone (FLONASE) 50 MCG/ACT nasal spray Place 1 spray into  both nostrils in the morning.   GLUCOSAMINE-CHONDROITIN PO Take 1 tablet by mouth every evening.    glucose blood (ONETOUCH VERIO) test strip TEST ONCE DAILY AS DIRECTED 90   ibuprofen (ADVIL) 200 MG tablet Take 400 mg by mouth every 6 (six) hours as needed for mild pain or moderate pain.   metFORMIN (GLUCOPHAGE-XR) 500 MG 24 hr tablet Take 500 mg by mouth 2 (two) times daily.   metoprolol succinate (TOPROL-XL) 25 MG 24 hr tablet TAKE 1 TABLET (25 MG TOTAL) BY MOUTH IN THE MORNING. HOLD IF SYSTOLIC BLOOD PRESSURE (TOP BLOOD PRESSURE NUMBER) LESS THAN 100 MMHG OR HEART RATE LESS THAN 60 BPM (PULSE). (Patient taking differently: Take 25 mg by mouth daily. Hold if systolic blood pressure (top blood pressure number) is less than 100 MMHG or heart rate less than 60 BPM (pulse))   Multiple Vitamin (MULTIVITAMIN WITH MINERALS) TABS tablet Take 1 tablet by mouth daily. Centrum Silver for Women 50+   nitroGLYCERIN (NITROSTAT) 0.4 MG SL tablet Place 0.4 mg under the tongue every 5 (five) minutes as needed for chest pain.   Olopatadine HCl 0.2 % SOLN Place 1 drop into both eyes daily. Pataday   ondansetron (ZOFRAN) 4 MG tablet Take 1 tablet (4 mg total) by mouth every 6 (six) hours as needed for nausea or vomiting.   ondansetron (ZOFRAN-ODT) 4  MG disintegrating tablet Take 4 mg by mouth every 6 (six) hours as needed.   OneTouch Delica Lancets 09T MISC Apply topically.   Probiotic Product (PROBIOTIC DAILY PO) Take 1 capsule by mouth in the morning.   sacubitril-valsartan (ENTRESTO) 49-51 MG Take 1 tablet by mouth 2 (two) times daily.   sertraline (ZOLOFT) 50 MG tablet Take 50 mg by mouth in the morning.   solifenacin (VESICARE) 10 MG tablet Take 10 mg by mouth in the morning.   spironolactone (ALDACTONE) 25 MG tablet TAKE 1 TABLET BY MOUTH EVERY DAY IN THE MORNING   traZODone (DESYREL) 50 MG tablet Take 50 mg by mouth at bedtime as needed for sleep.   No facility-administered encounter medications on file as  of 08/29/2022.     Past Medical History:  Diagnosis Date   Anemia    Anginal pain (HCC)    Anxiety    Arthritis    Lumbar spine DDD.   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   Diabetes mellitus without complication (HCC)    Type II   Dyspnea    When patient gets sick uses Pro-Air inhaler   Frequent sinus infections    GERD (gastroesophageal reflux disease)    occ   Goiter    History of cardiomegaly    History of cataract    Bilateral   History of kidney stones    08/28/2018 currently has a large kidney stone, asymptomatic at this time   History of migraine    History of uterine fibroid    History of uterine prolapse    Hyperlipidemia    Hypertension    LBBB (left bundle branch block)    Menopause 03/03/2012   Nasal turbinate hypertrophy 11/29/2016   Left inferior    Personal history of chemotherapy    Personal history of radiation therapy    Pneumonia    as a child   PONV (postoperative nausea and vomiting)    Sinusitis 2014   Sleep apnea    No CPAP   SUI (stress urinary incontinence, female)    SVD (spontaneous vaginal delivery)    x 2   Thyroid nodule     Past Surgical History:  Procedure Laterality Date   ABDOMINAL HYSTERECTOMY     BILATERAL SALPINGOOPHORECTOMY Bilateral    BIV ICD INSERTION CRT-D N/A 08/24/2022   Procedure: BIV ICD INSERTION CRT-D;  Surgeon: Evans Lance, MD;  Location: [facility];  Service: Cardiovascular;  Laterality: N/A;   BLADDER SUSPENSION N/A 05/16/2017   Procedure: TRANSVAGINAL TAPE (TVT) PROCEDURE;  Surgeon: Everett Graff, MD;  Location: [facility];  Service: Gynecology;  Laterality: N/A;   BREAST BIOPSY Right 2009   BREAST IMPLANT REMOVAL Bilateral 02/05/2022   Procedure: Removal bilateral breast implants;  Surgeon: [name], MD;   Location: [facility];  Service: Plastics;  Laterality: Bilateral;   BREAST LUMPECTOMY Right 2009   BREAST LUMPECTOMY Left 2006   BREAST RECONSTRUCTION WITH PLACEMENT OF TISSUE EXPANDER AND FLEX HD (ACELLULAR HYDRATED DERMIS) Bilateral 09/04/2021   Procedure: BILATERAL BREAST RECONSTRUCTION WITH PLACEMENT OF TISSUE EXPANDER AND FLEX HD (ACELLULAR HYDRATED DERMIS);  Surgeon: [name], MD;  Location: [facility];  Service: Plastics;  Laterality: Bilateral;   CARDIAC CATHETERIZATION     CATARACT EXTRACTION Left 10/23/2017  CATARACT EXTRACTION Right 11/06/2017   COLONOSCOPY  10/22/2009   Normal.  Repeat 5 years.  [name]   CYSTOSCOPY N/A 05/16/2017   Procedure: CYSTOSCOPY;  Surgeon: Everett Graff, MD;  Location: [facility];  Service: Gynecology;  Laterality: N/A;   DEBRIDEMENT AND CLOSURE WOUND Bilateral 10/03/2021   Procedure: Debridement bilateral mastectomy flaps;  Surgeon: [name], MD;  Location: [facility];  Service: Plastics;  Laterality: Bilateral;  1.5 hour   EYE SURGERY     bilateral cataracts   LEFT HEART CATH AND CORONARY ANGIOGRAPHY N/A 02/09/2020   Procedure: LEFT HEART CATH AND CORONARY ANGIOGRAPHY;  Surgeon: Nigel Mormon, MD;  Location: [facility];  Service: Cardiovascular;  Laterality: N/A;   LEFT HEART CATH AND CORONARY ANGIOGRAPHY N/A 03/27/2022   Procedure: LEFT HEART CATH AND CORONARY ANGIOGRAPHY;  Surgeon: Adrian Prows, MD;  Location: [facility];  Service: Cardiovascular;  Laterality: N/A;   MASTECTOMY W/ SENTINEL NODE BIOPSY Right 09/04/2021   Procedure: BILATERAL MASTECTOMIES WITH RIGHT SENTINEL LYMPH NODE BIOPSY;  Surgeon: Stark Klein, MD;  Location: [facility];  Service: General;  Laterality: Right;   REMOVAL OF BILATERAL TISSUE EXPANDERS WITH PLACEMENT OF BILATERAL BREAST IMPLANTS Bilateral 10/03/2021   Procedure: REMOVAL OF BILATERAL TISSUE EXPANDERS WITH REPLACEMENT OF TISSUE EXPANDERS;  Surgeon: [name], MD;  Location: [facility];  Service: Plastics;   Laterality: Bilateral;   REMOVAL OF BILATERAL TISSUE EXPANDERS WITH PLACEMENT OF BILATERAL BREAST IMPLANTS Bilateral 12/19/2021   Procedure: REMOVAL OF BILATERAL TISSUE EXPANDERS WITH PLACEMENT OF BILATERAL BREAST IMPLANTS;  Surgeon: [name], MD;  Location: [facility];  Service: Plastics;  Laterality: Bilateral;   ROBOTIC ASSISTED TOTAL HYSTERECTOMY N/A 05/16/2017   Procedure: ROBOTIC ASSISTED TOTAL HYSTERECTOMY;  Surgeon: Delsa Bern, MD;  Location: [facility];  Service: Gynecology;  Laterality: N/A;   ROTATOR CUFF REPAIR Bilateral    SHOULDER ARTHROSCOPY WITH ROTATOR CUFF REPAIR AND SUBACROMIAL DECOMPRESSION Right 09/03/2018   Procedure: Right shoulder manipulation under anesthesia, exam under anesthesia, mini open rotator cuff repair, subacromial decompression;  Surgeon: Susa Day, MD;  Location: [facility];  Service: Orthopedics;  Laterality: Right;  90 mins   TUBAL LIGATION     WISDOM TOOTH EXTRACTION      Family History  Problem Relation Age of Onset   Cancer Mother 65       breast cancer   Hypertension Mother    Diabetes Father    Cancer Sister        breast cancer   Breast cancer Sister    Cancer Sister        breast cancer   Breast cancer Sister     Social History   Social History Narrative   Marital status: divorced; dating seriously x many years.       Children:  2 children; 1 grandchild.      Employment: Personnel officer.      Lives: alone      Tobacco: none       Alcohol: none      Exercise:  Walking with sister two days per week.      Seatbelt: 100%      Guns: none      Right-handed   Caffeine: occasional coffee, hot tea or green tea a few times per week     Review of Systems General: Denies fevers, chills, weight loss CV: Denies chest pain, shortness of breath, palpitations Left mastectomy scar: She denies pain or drainage  Physical Exam  08/25/2022   11:15 AM 08/25/2022    9:00 AM 08/25/2022    4:49 AM  Vitals with BMI  Systolic [phone]   Diastolic 61 56 60  Pulse   76    General:  No acute distress,  Alert and oriented, Non-Toxic, Normal speech and affect Left mastectomy scar: The wound at the lateral aspect of the scar is imperceptible at this point. Mammogram: Not applicable Assessment/Plan Left mastectomy scar: The wound seems to be healing on its own and the family and patient are all happy to continue with conservative therapy.  They understand they may return at any time they have concerns and I will see them again after Christmas.  Jordan Willis 08/29/2022, 1:34 PM

## 2022-08-29 NOTE — Telephone Encounter (Signed)
[name], counseling intern, called patient to check on them after their heart surgery. The patient reported they were awake during their heart surgery. The patient reported it was "a lot." The patient reported their mother is home from the hospital also but because the patient is healing the care work is done by their daughter. The patient reported they are often the one to care for others and it is difficult to stay resting.   The patient reported they will call the counselor to set up an appointment when they feel healed enough to travel to the [place].   [name],  Counseling Intern  [phone] [email or web address]

## 2022-08-30 DIAGNOSIS — C50412 Malignant neoplasm of upper-outer quadrant of left female breast: Secondary | ICD-10-CM | POA: Diagnosis not present

## 2022-08-30 DIAGNOSIS — E1165 Type 2 diabetes mellitus with hyperglycemia: Secondary | ICD-10-CM | POA: Diagnosis not present

## 2022-08-30 DIAGNOSIS — Z23 Encounter for immunization: Secondary | ICD-10-CM | POA: Diagnosis not present

## 2022-08-30 DIAGNOSIS — Z95810 Presence of automatic (implantable) cardiac defibrillator: Secondary | ICD-10-CM | POA: Diagnosis not present

## 2022-08-31 ENCOUNTER — Ambulatory Visit (HOSPITAL_COMMUNITY): Admission: RE | Admit: 2022-08-31 | Payer: Medicare Other | Source: Home / Self Care | Admitting: Gastroenterology

## 2022-08-31 SURGERY — COLONOSCOPY WITH PROPOFOL
Anesthesia: Monitor Anesthesia Care

## 2022-09-05 ENCOUNTER — Ambulatory Visit: Payer: Medicare Other | Attending: Internal Medicine

## 2022-09-05 DIAGNOSIS — I428 Other cardiomyopathies: Secondary | ICD-10-CM | POA: Diagnosis not present

## 2022-09-05 NOTE — Patient Instructions (Signed)
   After Your ICD (Implantable Cardiac Defibrillator)    Monitor your defibrillator site for redness, swelling, and drainage. Call the device clinic at [phone] if you experience these symptoms or fever/chills.  Your incision was closed with Steri-strips or staples:  You may shower 7 days after your procedure and wash your incision with soap and water. Avoid lotions, ointments, or perfumes over your incision until it is well-healed.  You may use a hot tub or a pool after your wound check appointment if the incision is completely closed.  Do not lift, push or pull greater than 10 pounds with the affected arm until 6 weeks after your procedure. There are no other restrictions in arm movement after your wound check appointment. Until After December 15th   Your ICD is designed to protect you from life threatening heart rhythms. Because of this, you may receive a shock.   1 shock with no symptoms:  Call the office during business hours. 1 shock with symptoms (chest pain, chest pressure, dizziness, lightheadedness, shortness of breath, overall feeling unwell):  Call 911. If you experience 2 or more shocks in 24 hours:  Call 911. If you receive a shock, you should not drive.  [place] DMV - no driving for 6 months if you receive appropriate therapy from your ICD.   ICD Alerts:  Some alerts are vibratory and others beep. These are NOT emergencies. Please call our office to let us know. If this occurs at night or on weekends, it can wait until the next business day. Send a remote transmission.  If your device is capable of reading fluid status (for heart failure), you will be offered monthly monitoring to review this with you.   Remote monitoring is used to monitor your ICD from home. This monitoring is scheduled every 91 days by our office. It allows [place] to keep an eye on the functioning of your device to ensure it is working properly. You will routinely see your Electrophysiologist annually (more  often if necessary).

## 2022-09-06 LAB — CUP PACEART INCLINIC DEVICE CHECK
Battery Remaining Longevity: 45 mo
Brady Statistic RA Percent Paced: 3 %
Brady Statistic RV Percent Paced: 99.26 %
Date Time Interrogation Session: 20231115172800
HighPow Impedance: 54 Ohm
Implantable Lead Connection Status: 753985
Implantable Lead Connection Status: 753985
Implantable Lead Connection Status: 753985
Implantable Lead Implant Date: 20231103
Implantable Lead Implant Date: 20231103
Implantable Lead Implant Date: 20231103
Implantable Lead Location: 753858
Implantable Lead Location: 753859
Implantable Lead Location: 753860
Implantable Lead Model: 3830
Implantable Pulse Generator Implant Date: 20231103
Lead Channel Impedance Value: 312.5 Ohm
Lead Channel Impedance Value: 475 Ohm
Lead Channel Impedance Value: 550 Ohm
Lead Channel Pacing Threshold Amplitude: 0.5 V
Lead Channel Pacing Threshold Amplitude: 0.5 V
Lead Channel Pacing Threshold Amplitude: 0.5 V
Lead Channel Pacing Threshold Amplitude: 0.5 V
Lead Channel Pacing Threshold Amplitude: 0.5 V
Lead Channel Pacing Threshold Amplitude: 0.5 V
Lead Channel Pacing Threshold Pulse Width: 0.5 ms
Lead Channel Pacing Threshold Pulse Width: 0.5 ms
Lead Channel Pacing Threshold Pulse Width: 0.5 ms
Lead Channel Pacing Threshold Pulse Width: 0.5 ms
Lead Channel Pacing Threshold Pulse Width: 0.5 ms
Lead Channel Pacing Threshold Pulse Width: 0.5 ms
Lead Channel Sensing Intrinsic Amplitude: 4.5 mV
Lead Channel Sensing Intrinsic Amplitude: 9.2 mV
Lead Channel Setting Pacing Amplitude: 3.5 V
Lead Channel Setting Pacing Amplitude: 3.5 V
Lead Channel Setting Pacing Amplitude: 3.5 V
Lead Channel Setting Pacing Pulse Width: 0.5 ms
Lead Channel Setting Pacing Pulse Width: 0.5 ms
Lead Channel Setting Sensing Sensitivity: 0.5 mV
Pulse Gen Serial Number: 5548670
Zone Setting Status: 755011

## 2022-09-06 NOTE — Progress Notes (Signed)
CRT-D device check in office/Wound check 10/14 day post implant.  Steri strips removed, wound edges well approximated and well healed.  No signs of infection or hematoma.  Patient does report some intermittent pain and stinging around device site area and states she does take ibuprofen to help manage.  Thresholds and sensing consistent with previous device measurements. Lead impedance trends stable over time. No mode switch episodes recorded. No ventricular arrhythmia episodes recorded. Patient bi-ventricularly pacing >99% of the time. Device programmed with appropriate safety margins.   Heart failure diagnostics reviewed and trends are stable for patient and not much data at this point since in the first 2 weeks post implant period. Audible/vibratory alerts demonstrated for patient. No changes made this session. Estimated longevity (in acute post implant phase).  Patient enrolled in remote follow up. Plan to check device remotely every 3 months and patient scheduled to see Dr. Lovena Le in February for 91 day post implant follow up. Patient education completed including shock plan.

## 2022-09-19 DIAGNOSIS — I428 Other cardiomyopathies: Secondary | ICD-10-CM | POA: Diagnosis not present

## 2022-09-20 DIAGNOSIS — E7800 Pure hypercholesterolemia, unspecified: Secondary | ICD-10-CM | POA: Diagnosis not present

## 2022-09-20 DIAGNOSIS — E1165 Type 2 diabetes mellitus with hyperglycemia: Secondary | ICD-10-CM | POA: Diagnosis not present

## 2022-09-20 DIAGNOSIS — I10 Essential (primary) hypertension: Secondary | ICD-10-CM | POA: Diagnosis not present

## 2022-09-20 LAB — BASIC METABOLIC PANEL
BUN/Creatinine Ratio: 19 (ref 12–28)
BUN: 13 mg/dL (ref 8–27)
CO2: 24 mmol/L (ref 20–29)
Calcium: 10.2 mg/dL (ref 8.7–10.3)
Chloride: 98 mmol/L (ref 96–106)
Creatinine, Ser: 0.67 mg/dL (ref 0.57–1.00)
Glucose: 101 mg/dL — ABNORMAL HIGH (ref 70–99)
Potassium: 4.1 mmol/L (ref 3.5–5.2)
Sodium: 135 mmol/L (ref 134–144)
eGFR: 94 mL/min/{1.73_m2} (ref >59)

## 2022-09-20 LAB — CBC WITH DIFFERENTIAL/PLATELET
Basophils Absolute: 0 10*3/uL (ref 0.0–0.2)
Basos: 1 %
EOS (ABSOLUTE): 0.2 10*3/uL (ref 0.0–0.4)
Eos: 4 %
Hematocrit: 35.4 % (ref 34.0–46.6)
Hemoglobin: 11.9 g/dL (ref 11.1–15.9)
Immature Grans (Abs): 0 10*3/uL (ref 0.0–0.1)
Immature Granulocytes: 0 %
Lymphocytes Absolute: 1.7 10*3/uL (ref 0.7–3.1)
Lymphs: 35 %
MCH: 29.5 pg (ref 26.6–33.0)
MCHC: 33.6 g/dL (ref 31.5–35.7)
MCV: 88 fL (ref 79–97)
Monocytes Absolute: 0.5 10*3/uL (ref 0.1–0.9)
Monocytes: 10 %
Neutrophils Absolute: 2.4 10*3/uL (ref 1.4–7.0)
Neutrophils: 50 %
Platelets: 239 10*3/uL (ref 150–450)
RBC: 4.03 x10E6/uL (ref 3.77–5.28)
RDW: 12.9 % (ref 11.7–15.4)
WBC: 4.8 10*3/uL (ref 3.4–10.8)

## 2022-09-21 NOTE — Progress Notes (Signed)
HEMATOLOGY-ONCOLOGY TELEPHONE VISIT PROGRESS NOTE  I connected with our patient on 09/24/22 at 11:30 AM EST by telephone and verified that I am speaking with the correct person using two identifiers.  I discussed the limitations, risks, security and privacy concerns of performing an evaluation and management service by telephone and the availability of in person appointments.  I also discussed with the patient that there may be a patient responsible charge related to this service. The patient expressed understanding and agreed to proceed.   History of Present Illness: Jordan Willis is a 71- year-old patient with the above mentioned   history of right breast cancer having undergone bilateral mastectomies, was on anastrozole and was discontinued because of intense headaches. She switched to exemestane. She presents to the clinic today for telephone follow-up discuss tolerance to exemestane.  .   Oncology History  Malignant neoplasm of upper-outer quadrant of left breast in female, estrogen receptor negative ([place])  02/02/2005 Initial Diagnosis   Left breast needle core biopsy showed invasive mammary cancer   02/11/2005 Breast MRI   Left breast upper outer quadrant: 4.4 x 2.6 x 3.7 cm mass and a smaller adjacent mass measuring 1 x 0.8 x 0.4 cm   03/02/2005 - 07/20/2005 Neo-Adjuvant Chemotherapy   AC4 followed by Taxotere 4   08/03/2005 Surgery   Left breast lumpectomy: 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%, PR 0%, HER-2 negative, Ki-67 31%   09/26/2005 - 11/13/2005 Radiation Therapy   Adjuvant radiation therapy   03/18/2008 Initial Biopsy   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   03/22/2008 Surgery   Right breast lumpectomy 0.9 cm DCIS ER 90% PR 98% stage 0   04/11/2008 Procedure   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   05/10/2008 - 07/02/2008 Radiation Therapy   Adjuvant radiation  therapy   07/17/2008 - 04/16/2009 Anti-estrogen oral therapy   Femara then switched to tamoxifen which was discontinued in 2010 due to concern about uterine cancer   05/31/2021 Cancer Staging   Staging form: Breast, AJCC 7th Edition - Clinical: Stage IA (rT1c, N0, M0) - Signed by Nicholas Lose, MD on 05/31/2021 Stage prefix: Recurrence Laterality: Right Biopsy of metastatic site performed: No Estrogen receptor status: Positive Progesterone receptor status: Negative HER2 status: Positive   09/12/2021 Cancer Staging   Staging form: Breast, AJCC 7th Edition - Pathologic: Stage IIA (T2, N0, cM0) - Signed by Nicholas Lose, MD on 09/12/2021 Estrogen receptor status: Positive Progesterone receptor status: Positive HER2 status: Negative   Breast cancer of upper-outer quadrant of right female breast ([place])  05/23/2021 Relapse/Recurrence   Screening detected right breast mass at 10 o'clock position measuring 1.2 cm, ultrasound-guided biopsy revealed grade 3 IDC ER 20%, PR 0%, HER2 positive 3+, Ki-67 50%   09/04/2021 Surgery   Bilateral mastectomies Left mastectomy: Benign Right mastectomy: Grade 3 IDC with DCIS 3.6 cm, 0/6 lymph nodes negative, ER 20%, PR 0%, HER2 positive by IHC, Ki-67 50%   11/21/2021 - 01/23/2022 Chemotherapy   Patient is on Treatment Plan : BREAST Trastuzumab q21d     04/2022 -  Anti-estrogen oral therapy   Anastrozole daily     REVIEW OF SYSTEMS:   Constitutional: Denies fevers, chills or abnormal weight loss All other systems were reviewed with the patient and are negative. 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 HER-2  negative grade 3 status post neo adjuvant chemotherapy with FEC 4 followed by Taxotere 4 completed 07/20/2005 status post lumpectomy 1.2 cm, grade 3, triple negative, Ki-67 31%, status post radiation completed 11/13/2005 2009: 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%, PR 98% status post lumpectomy radiation and 5 years of tamoxifen completed 04/16/2009 Recurrence: 05/23/2021: Screening mammogram detected right breast mass 10 o'clock position 1.2 cm, biopsy with grade 3 IDC ER 20%, PR 0%, HER2 positive, Ki-67 5%) Bilateral mastectomies: Right mastectomy: Grade 3 IDC with DCIS 3.6 cm, 0/6 lymph nodes negative, ER 20%, PR 20%, HER2 positive by IHC, Ki-67 50% Received 4 cycles of Herceptin stopped because of decline in ejection fraction BRCA 2 mutation ---------------------------------------------------------------------------------------------------------------------------------------------------- Non-ischemic cardiomyopathy: NYHA clas 2: follows with cardiology. Heart cath: Normal. MUGA planned  Headaches: brain MRI 04/30/22: No mets, chronic microvascular ischemic changes   Recommendation: Anastrozole started 05/03/22 stopped 06/05/22, Switched to Exemestane 06/19/2022 Exemestane toxicities: Tolerating it much better.   Surveillance: She had bilateral mastectomies so there is no role of imaging  Because of her cardiac issues there is no further plan to do Herceptin anymore. Her mom got sick in [place] and was in hospital for 2 weeks.  RTC in 1 year  I discussed the assessment and treatment plan with the patient. The patient was provided an opportunity to ask questions and all were answered. The patient agreed with the plan and demonstrated an understanding of the instructions. The patient was advised to call back or seek an in-person evaluation if the symptoms worsen or if the condition fails to improve as anticipated.   I provided 12 minutes of non-face-to-face time during this encounter.  This includes time for  charting and coordination of care   Harriette Ohara, MD  I Gardiner Coins am scribing for [name]  I have reviewed the above documentation for accuracy and completeness, and I agree with the above.

## 2022-09-24 ENCOUNTER — Inpatient Hospital Stay: Payer: Medicare Other | Attending: Hematology and Oncology | Admitting: Hematology and Oncology

## 2022-09-24 DIAGNOSIS — Z923 Personal history of irradiation: Secondary | ICD-10-CM | POA: Insufficient documentation

## 2022-09-24 DIAGNOSIS — Z1501 Genetic susceptibility to malignant neoplasm of breast: Secondary | ICD-10-CM | POA: Insufficient documentation

## 2022-09-24 DIAGNOSIS — Z171 Estrogen receptor negative status [ER-]: Secondary | ICD-10-CM | POA: Diagnosis not present

## 2022-09-24 DIAGNOSIS — C50412 Malignant neoplasm of upper-outer quadrant of left female breast: Secondary | ICD-10-CM | POA: Insufficient documentation

## 2022-09-24 DIAGNOSIS — N6310 Unspecified lump in the right breast, unspecified quadrant: Secondary | ICD-10-CM | POA: Insufficient documentation

## 2022-09-24 DIAGNOSIS — Z1509 Genetic susceptibility to other malignant neoplasm: Secondary | ICD-10-CM | POA: Insufficient documentation

## 2022-09-24 DIAGNOSIS — Z803 Family history of malignant neoplasm of breast: Secondary | ICD-10-CM | POA: Insufficient documentation

## 2022-09-24 DIAGNOSIS — Z9013 Acquired absence of bilateral breasts and nipples: Secondary | ICD-10-CM | POA: Insufficient documentation

## 2022-09-24 DIAGNOSIS — Z79811 Long term (current) use of aromatase inhibitors: Secondary | ICD-10-CM | POA: Insufficient documentation

## 2022-09-24 NOTE — Assessment & Plan Note (Signed)
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% status post lumpectomy radiation and 5 years of tamoxifen completed 04/16/2009 Recurrence: 05/23/2021: Screening mammogram detected right breast mass 10 o'clock position 1.2 cm, biopsy with grade 3 IDC ER 20%, PR 0%, HER2 positive, Ki-67 5%) Bilateral mastectomies: Right mastectomy: Grade 3 IDC with DCIS 3.6 cm, 0/6 lymph nodes negative, ER 20%, PR 20%, HER2 positive by IHC, Ki-67 50% Received 4 cycles of Herceptin stopped because of decline in ejection fraction BRCA 2 mutation ---------------------------------------------------------------------------------------------------------------------------------------------------- Non-ischemic cardiomyopathy: NYHA clas 2: follows with cardiology. Heart cath: Normal. MUGA planned  Headaches: brain MRI 04/30/22: No mets, chronic microvascular ischemic changes   Recommendation: Anastrozole started 05/03/22 stopped 06/05/22, Switched to Exemestane 06/19/2022  Exemestane toxicities:  She has a wedding in [place] in September and she will start the exemestane when she comes back from the waiting around mid September.     Surveillance: She had bilateral mastectomies so there is no role of imaging   Because of her cardiac issues there is no further plan to do Herceptin anymore. Telephone visit in 6 weeks to discuss tolerance to exemestane. 

## 2022-10-01 ENCOUNTER — Telehealth: Payer: Self-pay

## 2022-10-01 ENCOUNTER — Encounter: Payer: Self-pay | Admitting: Internal Medicine

## 2022-10-01 ENCOUNTER — Telehealth: Payer: Self-pay | Admitting: Plastic Surgery

## 2022-10-01 NOTE — Telephone Encounter (Signed)
Please call pt at her request due to left breast drainage, sx 2/28 with gel implants.

## 2022-10-01 NOTE — Telephone Encounter (Signed)
Patient returned call

## 2022-10-01 NOTE — Telephone Encounter (Signed)
Returned patients call.   Patient reports of a small raised area beside the pacemaker. No redness, drainage or warmth. Tenderness noted to touch for several days.   Patient had to get off phone during conversation. Patient would like device clinic apt for area to be assessed.

## 2022-10-01 NOTE — Telephone Encounter (Signed)
[name], counseling intern, called patient after patient left a message on the counselor's voicemail.   Patient reported they are worried about drainage after their surgery and a lump they think might be scar tissue.   The patient shared they would like to set up a counseling session. The counselor shared they would be gone for two weeks and would call the patient the first week of January to set up an appointment.   [name],  Counseling Intern  [phone] [email or web address]

## 2022-10-01 NOTE — Telephone Encounter (Signed)
Patient called with concerns about drainage from her left breast.  She states that the drainage to her left breast started about 3 days ago.  Patient reports the drainage is brownish in color.  Patient denies any malodor or redness.  She denies any fevers or chills.  Patient states she is unable to visualize a wound from which it is draining.  Patient reports that before the drainage started, she felt a little bit of tightness in the area.  She states that the tightness is now resolved after the drainage began.  She states it is still little bit sore though.  I discussed with the patient that she should be evaluated in the clinic tomorrow.  I discussed with the patient that she can apply gauze over the area and replace as needed.  I instructed the patient to call in the meantime if she has any questions or concerns.

## 2022-10-01 NOTE — Telephone Encounter (Signed)
Pt called and states she noticed a lump to her right chest wall by her defibrillator. Pt states she does not know if this is scar tissue or recurrence. She sees her plastic surgeon tomorrow as her left breast spacer is draining. She will have Dr Lovena Le look at right side as well. She knows to call us if she were to need an appt with symptom management.

## 2022-10-02 ENCOUNTER — Telehealth: Payer: Self-pay | Admitting: *Deleted

## 2022-10-02 ENCOUNTER — Encounter: Payer: Self-pay | Admitting: Physician Assistant

## 2022-10-02 ENCOUNTER — Ambulatory Visit (INDEPENDENT_AMBULATORY_CARE_PROVIDER_SITE_OTHER): Payer: Medicare Other | Admitting: Physician Assistant

## 2022-10-02 VITALS — BP 158/66 | HR 75

## 2022-10-02 DIAGNOSIS — Z1509 Genetic susceptibility to other malignant neoplasm: Secondary | ICD-10-CM | POA: Diagnosis not present

## 2022-10-02 DIAGNOSIS — N6459 Other signs and symptoms in breast: Secondary | ICD-10-CM

## 2022-10-02 DIAGNOSIS — Z9889 Other specified postprocedural states: Secondary | ICD-10-CM

## 2022-10-02 DIAGNOSIS — Z9013 Acquired absence of bilateral breasts and nipples: Secondary | ICD-10-CM | POA: Diagnosis not present

## 2022-10-02 DIAGNOSIS — Z171 Estrogen receptor negative status [ER-]: Secondary | ICD-10-CM | POA: Diagnosis not present

## 2022-10-02 DIAGNOSIS — Z803 Family history of malignant neoplasm of breast: Secondary | ICD-10-CM | POA: Diagnosis not present

## 2022-10-02 DIAGNOSIS — Z79811 Long term (current) use of aromatase inhibitors: Secondary | ICD-10-CM | POA: Diagnosis not present

## 2022-10-02 DIAGNOSIS — Z923 Personal history of irradiation: Secondary | ICD-10-CM | POA: Diagnosis not present

## 2022-10-02 DIAGNOSIS — N6310 Unspecified lump in the right breast, unspecified quadrant: Secondary | ICD-10-CM | POA: Diagnosis not present

## 2022-10-02 DIAGNOSIS — Z1501 Genetic susceptibility to malignant neoplasm of breast: Secondary | ICD-10-CM | POA: Diagnosis not present

## 2022-10-02 DIAGNOSIS — C50412 Malignant neoplasm of upper-outer quadrant of left female breast: Secondary | ICD-10-CM | POA: Diagnosis not present

## 2022-10-02 MED ORDER — DOXYCYCLINE HYCLATE 100 MG PO TABS: 100.0000 mg | ORAL_TABLET | Freq: Two times a day (BID) | ORAL | 0 refills | Status: DC

## 2022-10-02 NOTE — Telephone Encounter (Signed)
Pt returning nurse call

## 2022-10-02 NOTE — Telephone Encounter (Signed)
Received call from pt regarding lump on right chest wall.  Pt states she was seen by plastic surgery today who has suggested our office assess the area. Visit scheduled, pt educated of appt date and time and verbalized understanding.

## 2022-10-02 NOTE — Addendum Note (Signed)
Addended byPenni Bombard, Alese Furniss on: 10/02/2022 04:08 PM   Modules accepted: Orders

## 2022-10-02 NOTE — Progress Notes (Signed)
   Referring Provider [name], MD [address] [place],  [place] [address]   CC:  Chief Complaint  Patient presents with   Follow-up      Jordan Willis is an 71 y.o. female.   HPI: This is a 71 year old female with past medical history of breast cancer status post reconstruction with implant exchange performed on 12/19/2021 and subsequent removal of bilateral implants performed 02/05/2022 by [name] due to right sided exposure who presents to clinic for follow-up evaluation.  Patient was most recently seen in the clinic on 08/29/2022.  The patient notes a small opening with intermittent drainage.  At her last office visit she had stated no significant drainage.  She notes over the last 3 days she is developing some swelling and discomfort in the left breast at the site of the mastectomy scar, she notes a purulent drainage.  She denies any fever chills nausea vomiting.  She also notes a nodule along the right breast.  Review of Systems General: negative for fever  Physical Exam    10/02/2022   11:42 AM 08/25/2022   11:15 AM 08/25/2022    9:00 AM  Vitals with BMI  Systolic 127 517 001  Diastolic 66 61 56  Pulse 75      General:  No acute distress,  Alert and oriented, Non-Toxic, Normal speech and affect Bilateral breast flaps viable, right mastectomy incision is clean dry and intact, she does have a approximate 1 cm nodule along the left outer upper breast.  The left mastectomy flaps are viable, there is a very small wound along the lateral mastectomy incision with purulent drainage, no surrounding redness, no warmth, minimal tenderness to palpation.   Assessment/Plan  This is a 71 year old female seen in our office for follow-up evaluation of a chronic wound to her left breast.  On my evaluation today there is purulent discharge from the wound.  The patient has no signs of systemic illness, the wound is open and draining, I was able to express approximately 5 cc of  purulent fluid today.  The patient is diabetic but notes her blood sugars been doing well.  I do think it is prudent to put the patient on antibiotics today, I like to see her back in our office on Friday for repeat evaluation.  She was accompanied by her children, I instructed them to reach out to our office immediately if she develops any new or worsening signs or symptoms.  Both the patient and her children verbalized understanding and agreement to this plan and had no further questions or concerns at today's visit.  [name] Tesean Stump 10/02/2022, 3:17 PM

## 2022-10-02 NOTE — Telephone Encounter (Signed)
Device Clinic apt made 10/03/22 @ 0800

## 2022-10-02 NOTE — Telephone Encounter (Signed)
Spoke to patients daughter who advised patient was in doctors appointment at this time. Advised to have patient call us back when she is available. Voiced understanding.

## 2022-10-03 ENCOUNTER — Ambulatory Visit: Payer: Medicare Other | Attending: Internal Medicine

## 2022-10-03 DIAGNOSIS — I428 Other cardiomyopathies: Secondary | ICD-10-CM

## 2022-10-03 NOTE — Patient Instructions (Signed)
Follow up as scheduled/per recall.

## 2022-10-03 NOTE — Progress Notes (Signed)
Pt seen in clinic to evaluate "knot" noted near device. Nodule evaluated by Dr. Caryl Comes.  Advises it is unrelated to device and she should discuss with her oncologist.  Pt states she has an appointment with oncologist 10/04/2022 and will have it evaluated.

## 2022-10-04 ENCOUNTER — Telehealth: Payer: Self-pay | Admitting: *Deleted

## 2022-10-04 ENCOUNTER — Other Ambulatory Visit: Payer: Self-pay

## 2022-10-04 ENCOUNTER — Encounter: Payer: Self-pay | Admitting: Adult Health

## 2022-10-04 ENCOUNTER — Inpatient Hospital Stay (HOSPITAL_BASED_OUTPATIENT_CLINIC_OR_DEPARTMENT_OTHER): Payer: Medicare Other | Admitting: Adult Health

## 2022-10-04 VITALS — BP 139/67 | HR 69 | Temp 96.9°F | Resp 17 | Wt 179.6 lb

## 2022-10-04 DIAGNOSIS — N6310 Unspecified lump in the right breast, unspecified quadrant: Secondary | ICD-10-CM | POA: Diagnosis not present

## 2022-10-04 DIAGNOSIS — R0789 Other chest pain: Secondary | ICD-10-CM

## 2022-10-04 DIAGNOSIS — Z171 Estrogen receptor negative status [ER-]: Secondary | ICD-10-CM | POA: Diagnosis not present

## 2022-10-04 DIAGNOSIS — Z79811 Long term (current) use of aromatase inhibitors: Secondary | ICD-10-CM | POA: Diagnosis not present

## 2022-10-04 DIAGNOSIS — C50412 Malignant neoplasm of upper-outer quadrant of left female breast: Secondary | ICD-10-CM

## 2022-10-04 DIAGNOSIS — Z1501 Genetic susceptibility to malignant neoplasm of breast: Secondary | ICD-10-CM

## 2022-10-04 DIAGNOSIS — Z803 Family history of malignant neoplasm of breast: Secondary | ICD-10-CM | POA: Diagnosis not present

## 2022-10-04 DIAGNOSIS — R222 Localized swelling, mass and lump, trunk: Secondary | ICD-10-CM

## 2022-10-04 DIAGNOSIS — Z9013 Acquired absence of bilateral breasts and nipples: Secondary | ICD-10-CM | POA: Diagnosis not present

## 2022-10-04 DIAGNOSIS — Z1509 Genetic susceptibility to other malignant neoplasm: Secondary | ICD-10-CM

## 2022-10-04 DIAGNOSIS — Z923 Personal history of irradiation: Secondary | ICD-10-CM | POA: Diagnosis not present

## 2022-10-04 DIAGNOSIS — Z1502 Genetic susceptibility to malignant neoplasm of ovary: Secondary | ICD-10-CM

## 2022-10-04 MED ORDER — OXYCODONE HCL 5 MG PO TABS: 5.0000 mg | ORAL_TABLET | Freq: Four times a day (QID) | ORAL | 0 refills | Status: DC | PRN

## 2022-10-04 NOTE — Telephone Encounter (Signed)
Called pt to make aware of appt with [facility] for breast ultrasound for 12/14 at 9:15 am. Advised to arrive 10-15 min early and advised not to wear powders or lotions. Pt expressed gratitude and verbalized understanding

## 2022-10-04 NOTE — Progress Notes (Signed)
[facility] Follow up:    [name], MD [address] [place] [address]   DIAGNOSIS:  Cancer Staging  Malignant neoplasm of upper-outer quadrant of left breast in female, estrogen receptor negative ([place]) Staging form: Breast, AJCC 7th Edition - Clinical: Stage IA (rT1c, N0, M0) - Signed by Nicholas Lose, MD on 05/31/2021 Stage prefix: Recurrence Laterality: Right Biopsy of metastatic site performed: No Estrogen receptor status: Positive Progesterone receptor status: Negative HER2 status: Positive - Pathologic: Stage IIA (T2, N0, cM0) - Signed by Nicholas Lose, MD on 09/12/2021 Estrogen receptor status: Positive Progesterone receptor status: Positive HER2 status: Negative   SUMMARY OF ONCOLOGIC HISTORY: Oncology History  Malignant neoplasm of upper-outer quadrant of left breast in female, estrogen receptor negative ([place])  02/02/2005 Initial Diagnosis   Left breast needle core biopsy showed invasive mammary cancer   02/11/2005 Breast MRI   Left breast upper outer quadrant: 4.4 x 2.6 x 3.7 cm mass and a smaller adjacent mass measuring 1 x 0.8 x 0.4 cm   03/02/2005 - 07/20/2005 Neo-Adjuvant Chemotherapy   AC4 followed by Taxotere 4   08/03/2005 Surgery   Left breast lumpectomy: 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%, PR 0%, HER-2 negative, Ki-67 31%   09/26/2005 - 11/13/2005 Radiation Therapy   Adjuvant radiation therapy   03/18/2008 Initial Biopsy   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   03/22/2008 Surgery   Right breast lumpectomy 0.9 cm DCIS ER 90% PR 98% stage 0   04/11/2008 Procedure   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   05/10/2008 - 07/02/2008 Radiation Therapy   Adjuvant radiation therapy   07/17/2008 - 04/16/2009 Anti-estrogen oral therapy   Femara then switched to tamoxifen which was discontinued in 2010 due  to concern about uterine cancer   05/31/2021 Cancer Staging   Staging form: Breast, AJCC 7th Edition - Clinical: Stage IA (rT1c, N0, M0) - Signed by Nicholas Lose, MD on 05/31/2021 Stage prefix: Recurrence Laterality: Right Biopsy of metastatic site performed: No Estrogen receptor status: Positive Progesterone receptor status: Negative HER2 status: Positive   09/12/2021 Cancer Staging   Staging form: Breast, AJCC 7th Edition - Pathologic: Stage IIA (T2, N0, cM0) - Signed by Nicholas Lose, MD on 09/12/2021 Estrogen receptor status: Positive Progesterone receptor status: Positive HER2 status: Negative   Breast cancer of upper-outer quadrant of right female breast ([place])  05/23/2021 Relapse/Recurrence   Screening detected right breast mass at 10 o'clock position measuring 1.2 cm, ultrasound-guided biopsy revealed grade 3 IDC ER 20%, PR 0%, HER2 positive 3+, Ki-67 50%   09/04/2021 Surgery   Bilateral mastectomies Left mastectomy: Benign Right mastectomy: Grade 3 IDC with DCIS 3.6 cm, 0/6 lymph nodes negative, ER 20%, PR 0%, HER2 positive by IHC, Ki-67 50%   11/21/2021 - 01/23/2022 Chemotherapy   Patient is on Treatment Plan : BREAST Trastuzumab q21d     04/2022 -  Anti-estrogen oral therapy   Anastrozole daily     CURRENT THERAPY: Exemestane  INTERVAL HISTORY: LAINY WROBLESKI 71 y.o. female returns for f/u and evaluation of a right mastectomy nodule she noticed about 5 days ago.  She also has been experiencing bleeding from her right mastectomy site that began this past week.  She saw plastic surgery for this and they obtained a wound culture that demonstrates no growth to date.  She is taking Doxycycline bid.  Patient Active Problem List   Diagnosis Date Noted   Bradycardia 08/24/2022   BRCA2 gene mutation positive in female 07/30/2022   LBBB (left bundle branch block)    Chronic diastolic heart failure (HCC)    NICM (nonischemic cardiomyopathy) ([place])    Breast cancer of  upper-outer quadrant of right female breast ([place]) 09/04/2021   Diabetic neuropathy ([place]) 02/08/2021   Angina pectoris ([place]) 02/09/2020   Abnormal stress test 02/09/2020   Complete rotator cuff tear 09/03/2018   Pelvic prolapse 05/16/2017   Intractable chronic post-traumatic headache 11/19/2016   Malignant neoplasm of upper-outer quadrant of left breast in female, estrogen receptor negative ([place]) 03/03/2012   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  MEDICAL HISTORY: Past Medical History:  Diagnosis Date   Anemia    Anginal pain (HCC)    Anxiety    Arthritis    Lumbar spine DDD.   Breast cancer, female, left 03/03/2012   Cardiomyopathy ([place])    Carpal tunnel syndrome    Bilateral, Mild   Cerebral atherosclerosis    Chronic sinusitis    DCIS (ductal carcinoma in situ) of breast, right 03/03/2012   Diabetes mellitus without complication (HCC)    Type II   Dyspnea    When patient gets sick uses Pro-Air inhaler   Frequent sinus infections    GERD (gastroesophageal reflux disease)    occ   Goiter    History of cardiomegaly    History of cataract    Bilateral   History of kidney stones    08/28/2018 currently has a large kidney stone, asymptomatic at this time   History of migraine    History of uterine fibroid    History of uterine prolapse    Hyperlipidemia    Hypertension    LBBB (left bundle branch block)    Menopause 03/03/2012   Nasal turbinate hypertrophy 11/29/2016   Left inferior    Personal history of chemotherapy    Personal history of radiation therapy    Pneumonia    as a child   PONV (postoperative nausea and vomiting)    Sinusitis 2014   Sleep apnea    No CPAP   SUI (stress urinary incontinence, female)    SVD (spontaneous vaginal delivery)    x 2   Thyroid nodule     SURGICAL HISTORY: Past Surgical History:   Procedure Laterality Date   ABDOMINAL HYSTERECTOMY     BILATERAL SALPINGOOPHORECTOMY Bilateral    BIV ICD INSERTION CRT-D N/A 08/24/2022   Procedure: BIV ICD INSERTION CRT-D;  Surgeon: Evans Lance, MD;  Location: [facility];  Service: Cardiovascular;  Laterality: N/A;   BLADDER SUSPENSION N/A 05/16/2017   Procedure: TRANSVAGINAL TAPE (TVT) PROCEDURE;  Surgeon: Everett Graff, MD;  Location: [facility];  Service: Gynecology;  Laterality: N/A;   BREAST BIOPSY Right 2009   BREAST IMPLANT REMOVAL Bilateral 02/05/2022   Procedure: Removal bilateral breast implants;  Surgeon: [name], MD;  Location: [facility];  Service: Plastics;  Laterality: Bilateral;   BREAST LUMPECTOMY Right 2009   BREAST LUMPECTOMY Left 2006   BREAST RECONSTRUCTION WITH PLACEMENT OF TISSUE EXPANDER AND FLEX HD (ACELLULAR HYDRATED DERMIS) Bilateral 09/04/2021   Procedure: BILATERAL BREAST RECONSTRUCTION WITH PLACEMENT OF TISSUE EXPANDER AND FLEX HD (ACELLULAR HYDRATED DERMIS);  Surgeon: [name], MD;  Location: [facility];  Service: Plastics;  Laterality: Bilateral;  CARDIAC CATHETERIZATION     CATARACT EXTRACTION Left 10/23/2017   CATARACT EXTRACTION Right 11/06/2017   COLONOSCOPY  10/22/2009   Normal.  Repeat 5 years.  [name]   CYSTOSCOPY N/A 05/16/2017   Procedure: CYSTOSCOPY;  Surgeon: Everett Graff, MD;  Location: [facility];  Service: Gynecology;  Laterality: N/A;   DEBRIDEMENT AND CLOSURE WOUND Bilateral 10/03/2021   Procedure: Debridement bilateral mastectomy flaps;  Surgeon: [name], MD;  Location: [facility];  Service: Plastics;  Laterality: Bilateral;  1.5 hour   EYE SURGERY     bilateral cataracts   LEFT HEART CATH AND CORONARY ANGIOGRAPHY N/A 02/09/2020   Procedure: LEFT HEART CATH AND CORONARY ANGIOGRAPHY;  Surgeon: Nigel Mormon, MD;  Location: [facility];  Service: Cardiovascular;  Laterality: N/A;   LEFT HEART CATH AND CORONARY ANGIOGRAPHY N/A 03/27/2022   Procedure: LEFT  HEART CATH AND CORONARY ANGIOGRAPHY;  Surgeon: Adrian Prows, MD;  Location: [facility];  Service: Cardiovascular;  Laterality: N/A;   MASTECTOMY W/ SENTINEL NODE BIOPSY Right 09/04/2021   Procedure: BILATERAL MASTECTOMIES WITH RIGHT SENTINEL LYMPH NODE BIOPSY;  Surgeon: Stark Klein, MD;  Location: [facility];  Service: General;  Laterality: Right;   REMOVAL OF BILATERAL TISSUE EXPANDERS WITH PLACEMENT OF BILATERAL BREAST IMPLANTS Bilateral 10/03/2021   Procedure: REMOVAL OF BILATERAL TISSUE EXPANDERS WITH REPLACEMENT OF TISSUE EXPANDERS;  Surgeon: [name], MD;  Location: [facility];  Service: Plastics;  Laterality: Bilateral;   REMOVAL OF BILATERAL TISSUE EXPANDERS WITH PLACEMENT OF BILATERAL BREAST IMPLANTS Bilateral 12/19/2021   Procedure: REMOVAL OF BILATERAL TISSUE EXPANDERS WITH PLACEMENT OF BILATERAL BREAST IMPLANTS;  Surgeon: [name], MD;  Location: [facility];  Service: Plastics;  Laterality: Bilateral;   ROBOTIC ASSISTED TOTAL HYSTERECTOMY N/A 05/16/2017   Procedure: ROBOTIC ASSISTED TOTAL HYSTERECTOMY;  Surgeon: Delsa Bern, MD;  Location: [facility];  Service: Gynecology;  Laterality: N/A;   ROTATOR CUFF REPAIR Bilateral    SHOULDER ARTHROSCOPY WITH ROTATOR CUFF REPAIR AND SUBACROMIAL DECOMPRESSION Right 09/03/2018   Procedure: Right shoulder manipulation under anesthesia, exam under anesthesia, mini open rotator cuff repair, subacromial decompression;  Surgeon: Susa Day, MD;  Location: [facility];  Service: Orthopedics;  Laterality: Right;  90 mins   TUBAL LIGATION     WISDOM TOOTH EXTRACTION      SOCIAL HISTORY: Social History   Socioeconomic History   Marital status: Divorced    Spouse name: n/a   Number of children: 2   Years of education: 12   Highest education level: Not on file  Occupational History   Occupation: Retired    Fish farm manager: [employer]    Comment: Psychologist, forensic  Tobacco Use   Smoking status: Never   Smokeless tobacco: Never  Vaping Use   Vaping  Use: Never used  Substance and Sexual Activity   Alcohol use: No   Drug use: No   Sexual activity: Not Currently    Partners: Male    Birth control/protection: Surgical, Post-menopausal    Comment: Hysterectomy  Other Topics Concern   Not on file  Social History Narrative   Marital status: divorced; dating seriously x many years.       Children:  2 children; 1 grandchild.      Employment: Personnel officer.      Lives: alone      Tobacco: none       Alcohol: none      Exercise:  Walking with sister two days per week.  Seatbelt: 100%      Guns: none      Right-handed   Caffeine: occasional coffee, hot tea or green tea a few times per week   Social Determinants of Health   Financial Resource Strain: Not on file  Food Insecurity: Not on file  Transportation Needs: Not on file  Physical Activity: Not on file  Stress: Not on file  Social Connections: Not on file  Intimate Partner Violence: Not on file    FAMILY HISTORY: Family History  Problem Relation Age of Onset   Cancer Mother 35       breast cancer   Hypertension Mother    Diabetes Father    Cancer Sister        breast cancer   Breast cancer Sister    Cancer Sister        breast cancer   Breast cancer Sister     Review of Systems  Constitutional:  Negative for appetite change, chills, fatigue, fever and unexpected weight change.  HENT:   Negative for hearing loss, lump/mass and trouble swallowing.   Eyes:  Negative for eye problems and icterus.  Respiratory:  Negative for chest tightness, cough and shortness of breath.   Cardiovascular:  Negative for chest pain, leg swelling and palpitations.  Gastrointestinal:  Negative for abdominal distention, abdominal pain, constipation, diarrhea, nausea and vomiting.  Endocrine: Negative for hot flashes.  Genitourinary:  Negative for difficulty urinating.   Musculoskeletal:  Negative for arthralgias.  Skin:  Negative for itching and rash.  Neurological:   Negative for dizziness, extremity weakness, headaches and numbness.  Hematological:  Negative for adenopathy. Does not bruise/bleed easily.  Psychiatric/Behavioral:  Negative for depression. The patient is not nervous/anxious.       PHYSICAL EXAMINATION  ECOG PERFORMANCE STATUS: 1 - Symptomatic but completely ambulatory  Vitals:   10/04/22 0906  BP: 139/67  Pulse: 69  Resp: 17  Temp: (!) 96.9 F (36.1 C)  SpO2: 100%    Physical Exam Constitutional:      General: She is not in acute distress.    Appearance: Normal appearance. She is not toxic-appearing.  HENT:     Head: Normocephalic and atraumatic.  Eyes:     General: No scleral icterus. Cardiovascular:     Rate and Rhythm: Normal rate and regular rhythm.     Pulses: Normal pulses.     Heart sounds: Normal heart sounds.  Pulmonary:     Effort: Pulmonary effort is normal.     Breath sounds: Normal breath sounds.  Chest:     Comments: Right mastectomy site with a 1 cm nodule on right lateral scar line, left breast s/p mastectomy, wound has opened, no sign of infection noted, with SS drainage from incision line.   Abdominal:     General: Abdomen is flat. Bowel sounds are normal. There is no distension.     Palpations: Abdomen is soft.     Tenderness: There is no abdominal tenderness.  Musculoskeletal:        General: No swelling.     Cervical back: Neck supple.  Lymphadenopathy:     Cervical: No cervical adenopathy.  Skin:    General: Skin is warm and dry.     Findings: No rash.  Neurological:     General: No focal deficit present.     Mental Status: She is alert.  Psychiatric:        Mood and Affect: Mood normal.        Behavior:  Behavior normal.     LABORATORY DATA: None for this visit    ASSESSMENT and THERAPY PLAN:   Malignant neoplasm of upper-outer quadrant of left breast in female, estrogen receptor negative ([place]) Falecia is here today for concerns at her bilateral mastectomy sites.  First, she has  a nodule at her right lateral mastectomy scar line.  Second, her incision scar has opened and is draining.    Due to these issues, I ordered bilateral ultrasounds to evaluate for recurrence.  I recommended that she f/u with plastic surgery and complete her antibiotics as prescribed by their office.    I recommended that she continue taking the Exemestane as she has been for her breast cancer.   We will f/u with her based on her ultrasound results.        All questions were answered. The patient knows to call the clinic with any problems, questions or concerns. We can certainly see the patient much sooner if necessary.  Total encounter time:30 minutes*in face-to-face visit time, chart review, lab review, care coordination, order entry, and documentation of the encounter time.    Wilber Bihari, NP 10/05/22 4:11 PM Medical Oncology and Hematology [facility] [place], [place] [address] Tel. [phone]    Fax. [phone]  *Total Encounter Time as defined by the Centers for Medicare and Medicaid Services includes, in addition to the face-to-face time of a patient visit (documented in the note above) non-face-to-face time: obtaining and reviewing outside history, ordering and reviewing medications, tests or procedures, care coordination (communications with other health care professionals or caregivers) and documentation in the medical record.

## 2022-10-05 ENCOUNTER — Ambulatory Visit (INDEPENDENT_AMBULATORY_CARE_PROVIDER_SITE_OTHER): Payer: Medicare Other | Admitting: Physician Assistant

## 2022-10-05 ENCOUNTER — Encounter: Payer: Self-pay | Admitting: Physician Assistant

## 2022-10-05 VITALS — BP 119/71 | HR 80 | Wt 178.0 lb

## 2022-10-05 DIAGNOSIS — Z9889 Other specified postprocedural states: Secondary | ICD-10-CM

## 2022-10-05 DIAGNOSIS — N6322 Unspecified lump in the left breast, upper inner quadrant: Secondary | ICD-10-CM | POA: Diagnosis not present

## 2022-10-05 DIAGNOSIS — N6321 Unspecified lump in the left breast, upper outer quadrant: Secondary | ICD-10-CM | POA: Diagnosis not present

## 2022-10-05 DIAGNOSIS — N6459 Other signs and symptoms in breast: Secondary | ICD-10-CM

## 2022-10-05 DIAGNOSIS — N6313 Unspecified lump in the right breast, lower outer quadrant: Secondary | ICD-10-CM | POA: Diagnosis not present

## 2022-10-05 DIAGNOSIS — N6311 Unspecified lump in the right breast, upper outer quadrant: Secondary | ICD-10-CM | POA: Diagnosis not present

## 2022-10-05 NOTE — Progress Notes (Signed)
   Referring Provider [name], MD [address] [place],  [place] [address]   CC:  Chief Complaint  Patient presents with   Follow-up    Pt stated--having pain left breast.      Jordan Willis is an 71 y.o. female.   HPI: This is a 71 year old female with past medical history of breast cancer status post reconstruction with implant exchange performed on 12/19/2021 and subsequent removal of bilateral implants performed 02/05/2022 by [name] due to right sided exposure who presents to clinic for follow-up evaluation.  Seen in office on 10/02/2022.  At that time she had reported drainage out of the left breast, she had purulent drainage at that time, no surrounding redness fever or systemic symptoms.  I did place her on antibiotics.  She notes since that time she has continued to have drainage which has lessened over the last day, she denies any fever chills nausea or vomiting.  She additionally followed up with oncology for a nodule in her right breast and has an ultrasound of bilateral breast today.  Review of Systems General: negative for fever  Physical Exam    10/05/2022    8:13 AM 10/04/2022    9:06 AM 10/02/2022   11:42 AM  Vitals with BMI  Weight 178 lbs 179 lbs 9 oz   Systolic 720 947 096  Diastolic 71 67 66  Pulse 80 69 75    General:  No acute distress,  Alert and oriented, Non-Toxic, Normal speech and affect Bilateral breast flaps viable, right mastectomy incision is clean dry and intact, she does have a approximate 1 cm nodule along the left outer upper breast.  The left mastectomy flaps are viable, there is a very small wound along the lateral mastectomy incision with purulent drainage, no surrounding redness, no warmth, minimal tenderness to palpation.  Assessment/Plan  This is a 71 year old female seen in clinic for follow-up evaluation of chronic wound with purulence.  The patient is doing well, I was able to express purulence again today.  No  surrounding redness or swelling, no fever or chills.  This is a very difficult situation given the fact that she has poor wound healing and is status post radiation of the left breast.  The wound is already draining, she is on antibiotics we will continue to monitor closely.  The patient understands to reach out to our office immediately if she develops any new or worsening signs or symptoms.  We will see her Monday morning, she verbalized understanding and agreement to today's plan had no further questions or concerns   [name] [name] 10/05/2022, 8:39 AM

## 2022-10-05 NOTE — Assessment & Plan Note (Addendum)
Jordan Willis is here today for concerns at her bilateral mastectomy sites.  First, she has a nodule at her right lateral mastectomy scar line.  Second, her incision scar has opened and is draining.    Due to these issues, I ordered bilateral ultrasounds to evaluate for recurrence.  I recommended that she f/u with plastic surgery and complete her antibiotics as prescribed by their office.    I recommended that she continue taking the Exemestane as she has been for her breast cancer.   We will f/u with her based on her ultrasound results.

## 2022-10-07 LAB — AEROBIC/ANAEROBIC CULTURE W GRAM STAIN (SURGICAL/DEEP WOUND)

## 2022-10-08 ENCOUNTER — Ambulatory Visit (INDEPENDENT_AMBULATORY_CARE_PROVIDER_SITE_OTHER): Payer: Medicare Other | Admitting: Physician Assistant

## 2022-10-08 ENCOUNTER — Telehealth: Payer: Self-pay

## 2022-10-08 VITALS — BP 122/72 | HR 72

## 2022-10-08 DIAGNOSIS — Z923 Personal history of irradiation: Secondary | ICD-10-CM | POA: Diagnosis not present

## 2022-10-08 DIAGNOSIS — N6459 Other signs and symptoms in breast: Secondary | ICD-10-CM

## 2022-10-08 DIAGNOSIS — Z9889 Other specified postprocedural states: Secondary | ICD-10-CM

## 2022-10-08 NOTE — Telephone Encounter (Signed)
Received order request from [facility] for [place] bx to right chest 10/16/22. Signed order per NP and faxed back to [facility]. Called pt to offer MD f/u appt for 10/25/22. Pt accepted offer for 0815.

## 2022-10-08 NOTE — Progress Notes (Signed)
Referring Provider [name], MD [address] [place] [address],  [place] [id number]   CC:  Chief Complaint  Patient presents with   Post-op Follow-up      Jordan Willis is an 71 y.o. female.  HPI: Patient is a very pleasant 71 year old female with PMH of breast cancer s/p reconstruction with implant exchange performed 12/19/2021 and subsequent removal of bilateral implants performed 02/05/2022 by [name] due to right sided exposure who presents to clinic for postoperative follow-up.  She has a history of radiation to bilateral breast.   She was last seen here in clinic on 10/02/2022.  At that time, she complained of purulent drainage from a small opening in the left breast.  On exam, small wound noted along the lateral mastectomy incision with purulent drainage, but no surrounding cellulitic changes.  Discussed medical management.  She was placed on doxycycline.  Today, patient is accompanied by family at bedside.  She reports that her drainage has improved compared to the large volume output she was having when she first called the clinic last week.  However, she states that she still has drainage noted on her gauze when she performs her dressing changes.  She reports that she has tenderness at her mastectomy site, but pain is relatively well-controlled.  Denies any fevers or other systemic symptoms.  She states that her oncologist, [name], had performed an ultrasound of the left mastectomy site which revealed a small pocket of fluid.  She states that she also had an ultrasound the right side where she has a mass that is going to require biopsy 10/16/2022.  She has been continuing with her doxycycline, as directed.   Allergies  Allergen Reactions   Bactrim [Sulfamethoxazole-Trimethoprim] Shortness Of Breath    Shortness of breath, difficulty breathing, headache, rash, fatigue   Aspirin Palpitations and Other (See Comments)    Heart fluttering, pt takes 40.5 mg (half of 81 mg)  daily Other reaction(s): bradycardia, Not available   Sulfa Antibiotics Hives   Tape Rash    Tears skin off    Outpatient Encounter Medications as of 10/08/2022  Medication Sig   albuterol (VENTOLIN HFA) 108 (90 Base) MCG/ACT inhaler Inhale 2 puffs into the lungs every 6 (six) hours as needed for wheezing or shortness of breath.   Ascorbic Acid (VITAMIN C PO) Take 500 mg by mouth daily.   b complex vitamins capsule Take 1 capsule by mouth daily.   bumetanide (BUMEX) 1 MG tablet TAKE 1 TABLET BY MOUTH EVERY DAY   cetirizine (ZYRTEC) 10 MG tablet Take 10 mg by mouth in the morning.   Cholecalciferol (VITAMIN D3) 25 MCG (1000 UT) CAPS Take 1,000 Units by mouth in the morning.   doxycycline (VIBRA-TABS) 100 MG tablet Take 1 tablet (100 mg total) by mouth 2 (two) times daily.   exemestane (AROMASIN) 25 MG tablet Take 1 tablet (25 mg total) by mouth daily after breakfast.   FARXIGA 10 MG TABS tablet TAKE 1 TABLET BY MOUTH EVERY DAY BEFORE BREAKFAST   fluticasone (FLONASE) 50 MCG/ACT nasal spray Place 1 spray into both nostrils in the morning.   GLUCOSAMINE-CHONDROITIN PO Take 1 tablet by mouth every evening.    glucose blood (ONETOUCH VERIO) test strip TEST ONCE DAILY AS DIRECTED 90   ibuprofen (ADVIL) 200 MG tablet Take 400 mg by mouth every 6 (six) hours as needed for mild pain or moderate pain.   metFORMIN (GLUCOPHAGE-XR) 500 MG 24 hr tablet Take 500 mg by  mouth 2 (two) times daily.   metoprolol succinate (TOPROL-XL) 25 MG 24 hr tablet TAKE 1 TABLET (25 MG TOTAL) BY MOUTH IN THE MORNING. HOLD IF SYSTOLIC BLOOD PRESSURE (TOP BLOOD PRESSURE NUMBER) LESS THAN 100 MMHG OR HEART RATE LESS THAN 60 BPM (PULSE). (Patient taking differently: Take 25 mg by mouth daily. Hold if systolic blood pressure (top blood pressure number) is less than 100 MMHG or heart rate less than 60 BPM (pulse))   Multiple Vitamin (MULTIVITAMIN WITH MINERALS) TABS tablet Take 1 tablet by mouth daily. Centrum Silver for Women 50+    nitroGLYCERIN (NITROSTAT) 0.4 MG SL tablet Place 0.4 mg under the tongue every 5 (five) minutes as needed for chest pain.   Olopatadine HCl 0.2 % SOLN Place 1 drop into both eyes daily. Pataday   ondansetron (ZOFRAN) 4 MG tablet Take 1 tablet (4 mg total) by mouth every 6 (six) hours as needed for nausea or vomiting.   OneTouch Delica Lancets 65H MISC Apply topically.   oxyCODONE (OXY IR/ROXICODONE) 5 MG immediate release tablet Take 1 tablet (5 mg total) by mouth every 6 (six) hours as needed for severe pain.   Probiotic Product (PROBIOTIC DAILY PO) Take 1 capsule by mouth in the morning.   rosuvastatin (CRESTOR) 20 MG tablet Take 20 mg by mouth daily.   sacubitril-valsartan (ENTRESTO) 49-51 MG Take 1 tablet by mouth 2 (two) times daily.   sertraline (ZOLOFT) 50 MG tablet Take 50 mg by mouth in the morning.   solifenacin (VESICARE) 10 MG tablet Take 10 mg by mouth in the morning.   spironolactone (ALDACTONE) 25 MG tablet TAKE 1 TABLET BY MOUTH EVERY DAY IN THE MORNING   traZODone (DESYREL) 50 MG tablet Take 50 mg by mouth at bedtime as needed for sleep.   No facility-administered encounter medications on file as of 10/08/2022.     Past Medical History:  Diagnosis Date   Anemia    Anginal pain (HCC)    Anxiety    Arthritis    Lumbar spine DDD.   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   Diabetes mellitus without complication (HCC)    Type II   Dyspnea    When patient gets sick uses Pro-Air inhaler   Frequent sinus infections    GERD (gastroesophageal reflux disease)    occ   Goiter    History of cardiomegaly    History of cataract    Bilateral   History of kidney stones    08/28/2018 currently has a large kidney stone, asymptomatic at this time   History of migraine    History of uterine fibroid     History of uterine prolapse    Hyperlipidemia    Hypertension    LBBB (left bundle branch block)    Menopause 03/03/2012   Nasal turbinate hypertrophy 11/29/2016   Left inferior    Personal history of chemotherapy    Personal history of radiation therapy    Pneumonia    as a child   PONV (postoperative nausea and vomiting)    Sinusitis 2014   Sleep apnea    No CPAP   SUI (stress urinary incontinence, female)    SVD (spontaneous vaginal delivery)    x 2   Thyroid nodule     Past Surgical History:  Procedure Laterality Date   ABDOMINAL HYSTERECTOMY     BILATERAL SALPINGOOPHORECTOMY Bilateral    BIV ICD INSERTION CRT-D N/A 08/24/2022   Procedure: BIV ICD INSERTION CRT-D;  Surgeon: Evans Lance, MD;  Location: [facility];  Service: Cardiovascular;  Laterality: N/A;   BLADDER SUSPENSION N/A 05/16/2017   Procedure: TRANSVAGINAL TAPE (TVT) PROCEDURE;  Surgeon: Everett Graff, MD;  Location: [facility];  Service: Gynecology;  Laterality: N/A;   BREAST BIOPSY Right 2009   BREAST IMPLANT REMOVAL Bilateral 02/05/2022   Procedure: Removal bilateral breast implants;  Surgeon: [name], MD;  Location: [facility];  Service: Plastics;  Laterality: Bilateral;   BREAST LUMPECTOMY Right 2009   BREAST LUMPECTOMY Left 2006   BREAST RECONSTRUCTION WITH PLACEMENT OF TISSUE EXPANDER AND FLEX HD (ACELLULAR HYDRATED DERMIS) Bilateral 09/04/2021   Procedure: BILATERAL BREAST RECONSTRUCTION WITH PLACEMENT OF TISSUE EXPANDER AND FLEX HD (ACELLULAR HYDRATED DERMIS);  Surgeon: [name], MD;  Location: [facility];  Service: Plastics;  Laterality: Bilateral;   CARDIAC CATHETERIZATION     CATARACT EXTRACTION Left 10/23/2017   CATARACT EXTRACTION Right 11/06/2017   COLONOSCOPY  10/22/2009   Normal.  Repeat 5 years.  [name]   CYSTOSCOPY N/A 05/16/2017   Procedure: CYSTOSCOPY;  Surgeon: Everett Graff, MD;  Location: [facility];  Service: Gynecology;  Laterality: N/A;   DEBRIDEMENT AND CLOSURE  WOUND Bilateral 10/03/2021   Procedure: Debridement bilateral mastectomy flaps;  Surgeon: [name], MD;  Location: [facility];  Service: Plastics;  Laterality: Bilateral;  1.5 hour   EYE SURGERY     bilateral cataracts   LEFT HEART CATH AND CORONARY ANGIOGRAPHY N/A 02/09/2020   Procedure: LEFT HEART CATH AND CORONARY ANGIOGRAPHY;  Surgeon: Nigel Mormon, MD;  Location: [facility];  Service: Cardiovascular;  Laterality: N/A;   LEFT HEART CATH AND CORONARY ANGIOGRAPHY N/A 03/27/2022   Procedure: LEFT HEART CATH AND CORONARY ANGIOGRAPHY;  Surgeon: Adrian Prows, MD;  Location: [facility];  Service: Cardiovascular;  Laterality: N/A;   MASTECTOMY W/ SENTINEL NODE BIOPSY Right 09/04/2021   Procedure: BILATERAL MASTECTOMIES WITH RIGHT SENTINEL LYMPH NODE BIOPSY;  Surgeon: Stark Klein, MD;  Location: [facility];  Service: General;  Laterality: Right;   REMOVAL OF BILATERAL TISSUE EXPANDERS WITH PLACEMENT OF BILATERAL BREAST IMPLANTS Bilateral 10/03/2021   Procedure: REMOVAL OF BILATERAL TISSUE EXPANDERS WITH REPLACEMENT OF TISSUE EXPANDERS;  Surgeon: [name], MD;  Location: [facility];  Service: Plastics;  Laterality: Bilateral;   REMOVAL OF BILATERAL TISSUE EXPANDERS WITH PLACEMENT OF BILATERAL BREAST IMPLANTS Bilateral 12/19/2021   Procedure: REMOVAL OF BILATERAL TISSUE EXPANDERS WITH PLACEMENT OF BILATERAL BREAST IMPLANTS;  Surgeon: [name], MD;  Location: [facility];  Service: Plastics;  Laterality: Bilateral;   ROBOTIC ASSISTED TOTAL HYSTERECTOMY N/A 05/16/2017   Procedure: ROBOTIC ASSISTED TOTAL HYSTERECTOMY;  Surgeon: Delsa Bern, MD;  Location: [facility];  Service: Gynecology;  Laterality: N/A;   ROTATOR CUFF REPAIR Bilateral    SHOULDER ARTHROSCOPY WITH ROTATOR CUFF REPAIR AND SUBACROMIAL DECOMPRESSION Right 09/03/2018   Procedure: Right shoulder manipulation under anesthesia, exam under anesthesia, mini open rotator cuff repair, subacromial decompression;  Surgeon: Susa Day, MD;  Location: [facility];  Service: Orthopedics;  Laterality: Right;  90 mins   TUBAL LIGATION     WISDOM TOOTH EXTRACTION      Family History  Problem Relation Age of Onset   Cancer Mother 35       breast cancer   Hypertension  Mother    Diabetes Father    Cancer Sister        breast cancer   Breast cancer Sister    Cancer Sister        breast cancer   Breast cancer Sister     Social History   Social History Narrative   Marital status: divorced; dating seriously x many years.       Children:  2 children; 1 grandchild.      Employment: Personnel officer.      Lives: alone      Tobacco: none       Alcohol: none      Exercise:  Walking with sister two days per week.      Seatbelt: 100%      Guns: none      Right-handed   Caffeine: occasional coffee, hot tea or Teriyah Purington tea a few times per week     Review of Systems General: Denies fevers or chills Cardio: Denies chest pain Pulmonary: Denies difficulty breathing  Physical Exam    10/08/2022   10:09 AM 10/05/2022    8:13 AM 10/04/2022    9:06 AM  Vitals with BMI  Weight  178 lbs 373 lbs 9 oz  Systolic 428 768 115  Diastolic 72 71 67  Pulse 72 80 69    General:  No acute distress, nontoxic appearing  Respiratory: No increased work of breathing Neuro: Alert and oriented Psychiatric: Normal mood and affect  Left vasectomy site: Pinpoint incisional wound, draining a cloudy brownish thin drainage.  No surrounding induration or erythema.  Drainage site is mildly tender to palpation.  No overt fluctuance concerning for large abscess.    Assessment/Plan  Chronic draining wound left mastectomy site:  Unfortunately this patient has been experiencing persistent issues at her left mastectomy site.  She previously had expander placed, but it was removed due to infection.  She reports that recent imaging obtained revealed a small pocket of fluid.  While I cannot verify in chart, can confirm that she feels as though  she has a pocket just underneath her mastectomy site incision that would be amenable to recurrent infection.  Unfortunately, due to her radiation she has not healed well postoperatively.  Discussed case with Dr. Lovena Le who personally evaluated patient at bedside.  He believes that she would benefit from more definitive intervention via surgery to close the pocket and mitigate risk of recurrent infections.  Patient wants to have her right-sided biopsy addressed before considering any revisional surgery of the left mastectomy site.  However, she is agreeable that the ongoing wound care and drainage concerns are problematic and that she would benefit from a more definitive intervention rather than repetitive antibiotic prescriptions.  Clinically she is well-appearing and her exam is without any overt cellulitis.  She does have however expressible cloudy drainage.  Purulence versus liquefied fat necrosis.  Finish course of doxycycline.  Monitor for signs of worsening infection.  Return in 1 month for consult with Dr. Lovena Le to discuss possible surgical steps.  Krista Blue 10/08/2022, 1:31 PM

## 2022-10-09 ENCOUNTER — Encounter: Payer: Self-pay | Admitting: Adult Health

## 2022-10-11 ENCOUNTER — Other Ambulatory Visit: Payer: Self-pay | Admitting: Gastroenterology

## 2022-10-16 ENCOUNTER — Other Ambulatory Visit: Payer: Self-pay

## 2022-10-16 DIAGNOSIS — C50811 Malignant neoplasm of overlapping sites of right female breast: Secondary | ICD-10-CM | POA: Diagnosis not present

## 2022-10-18 ENCOUNTER — Ambulatory Visit: Payer: Medicare Other | Admitting: Plastic Surgery

## 2022-10-22 NOTE — Progress Notes (Signed)
Patient Care Team: [name], MD as PCP - General (Internal Medicine) Delsa Bern, MD as Attending Physician (Obstetrics and Gynecology) Nicholas Lose, MD as Consulting Physician (Hematology and Oncology) Stark Klein, MD as Consulting Physician (General Surgery) Evans Lance, MD as Consulting Physician (Cardiology)  DIAGNOSIS: No diagnosis found.  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%, PR 0%, HER-2 negative, Ki-67 31%   09/26/2005 - 11/13/2005 Radiation Therapy   Adjuvant radiation therapy   03/18/2008 Initial Biopsy   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   03/22/2008 Surgery   Right breast lumpectomy 0.9 cm DCIS ER 90% PR 98% stage 0   04/11/2008 Procedure   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   05/10/2008 - 07/02/2008 Radiation Therapy   Adjuvant radiation therapy   07/17/2008 - 04/16/2009 Anti-estrogen oral therapy   Femara then switched to tamoxifen which was discontinued in 2010 due to concern about uterine cancer   05/31/2021 Cancer Staging   Staging form: Breast, AJCC 7th Edition - Clinical: Stage IA (rT1c, N0, M0) - Signed by Nicholas Lose, MD on 05/31/2021 Stage prefix: Recurrence Laterality: Right Biopsy of metastatic site performed: No Estrogen receptor status: Positive Progesterone receptor status: Negative HER2 status: Positive    09/12/2021 Cancer Staging   Staging form: Breast, AJCC 7th Edition - Pathologic: Stage IIA (T2, N0, cM0) - Signed by Nicholas Lose, MD on 09/12/2021 Estrogen receptor status: Positive Progesterone receptor status: Positive HER2 status: Negative   Breast cancer of upper-outer quadrant of right female breast ([place])  05/23/2021 Relapse/Recurrence   Screening detected right breast mass at 10 o'clock position measuring 1.2 cm, ultrasound-guided biopsy revealed grade 3 IDC ER 20%, PR 0%, HER2 positive 3+, Ki-67 50%   09/04/2021 Surgery   Bilateral mastectomies Left mastectomy: Benign Right mastectomy: Grade 3 IDC with DCIS 3.6 cm, 0/6 lymph nodes negative, ER 20%, PR 0%, HER2 positive by IHC, Ki-67 50%   11/21/2021 - 01/23/2022 Chemotherapy   Patient is on Treatment Plan : BREAST Trastuzumab q21d     04/2022 -  Anti-estrogen oral therapy   Anastrozole daily     CHIEF COMPLIANT: Follow-up right breast cancer   INTERVAL HISTORY: Jordan Willis is a 72- year-old patient with the above mentioned   history of right breast cancer having undergone bilateral mastectomies, currently on chemotherapy with Trastzumab. She presents to the clinic today for follow-up      ALLERGIES:  is allergic to bactrim [sulfamethoxazole-trimethoprim], aspirin, sulfa antibiotics, and tape.  MEDICATIONS:  Current Outpatient Medications  Medication Sig Dispense Refill   albuterol (VENTOLIN HFA) 108 (90 Base) MCG/ACT inhaler Inhale 2 puffs into the lungs every 6 (six) hours as needed for wheezing or shortness of breath.     Ascorbic Acid (VITAMIN C PO) Take 500 mg by mouth daily.     b complex vitamins capsule Take 1 capsule by mouth daily.  bumetanide (BUMEX) 1 MG tablet TAKE 1 TABLET BY MOUTH EVERY DAY 90 tablet 1   cetirizine (ZYRTEC) 10 MG tablet Take 10 mg by mouth in the morning.     Cholecalciferol (VITAMIN D3) 25 MCG (1000 UT) CAPS Take 1,000 Units by mouth in the morning.     doxycycline (VIBRA-TABS) 100 MG  tablet Take 1 tablet (100 mg total) by mouth 2 (two) times daily. 20 tablet 0   exemestane (AROMASIN) 25 MG tablet Take 1 tablet (25 mg total) by mouth daily after breakfast. 90 tablet 3   FARXIGA 10 MG TABS tablet TAKE 1 TABLET BY MOUTH EVERY DAY BEFORE BREAKFAST 90 tablet 0   fluticasone (FLONASE) 50 MCG/ACT nasal spray Place 1 spray into both nostrils in the morning.     GLUCOSAMINE-CHONDROITIN PO Take 1 tablet by mouth every evening.      glucose blood (ONETOUCH VERIO) test strip TEST ONCE DAILY AS DIRECTED 90     ibuprofen (ADVIL) 200 MG tablet Take 400 mg by mouth every 6 (six) hours as needed for mild pain or moderate pain.     metFORMIN (GLUCOPHAGE-XR) 500 MG 24 hr tablet Take 500 mg by mouth 2 (two) times daily.     metoprolol succinate (TOPROL-XL) 25 MG 24 hr tablet TAKE 1 TABLET (25 MG TOTAL) BY MOUTH IN THE MORNING. HOLD IF SYSTOLIC BLOOD PRESSURE (TOP BLOOD PRESSURE NUMBER) LESS THAN 100 MMHG OR HEART RATE LESS THAN 60 BPM (PULSE). (Patient taking differently: Take 25 mg by mouth daily. Hold if systolic blood pressure (top blood pressure number) is less than 100 MMHG or heart rate less than 60 BPM (pulse)) 90 tablet 3   Multiple Vitamin (MULTIVITAMIN WITH MINERALS) TABS tablet Take 1 tablet by mouth daily. Centrum Silver for Women 50+     nitroGLYCERIN (NITROSTAT) 0.4 MG SL tablet Place 0.4 mg under the tongue every 5 (five) minutes as needed for chest pain.     Olopatadine HCl 0.2 % SOLN Place 1 drop into both eyes daily. Pataday     ondansetron (ZOFRAN) 4 MG tablet Take 1 tablet (4 mg total) by mouth every 6 (six) hours as needed for nausea or vomiting. 20 tablet 0   OneTouch Delica Lancets 44Y MISC Apply topically.     oxyCODONE (OXY IR/ROXICODONE) 5 MG immediate release tablet Take 1 tablet (5 mg total) by mouth every 6 (six) hours as needed for severe pain. 30 tablet 0   Probiotic Product (PROBIOTIC DAILY PO) Take 1 capsule by mouth in the morning.     rosuvastatin (CRESTOR) 20 MG  tablet Take 20 mg by mouth daily.     sacubitril-valsartan (ENTRESTO) 49-51 MG Take 1 tablet by mouth 2 (two) times daily. 180 tablet 1   sertraline (ZOLOFT) 50 MG tablet Take 50 mg by mouth in the morning.     solifenacin (VESICARE) 10 MG tablet Take 10 mg by mouth in the morning.     spironolactone (ALDACTONE) 25 MG tablet TAKE 1 TABLET BY MOUTH EVERY DAY IN THE MORNING 90 tablet 0   traZODone (DESYREL) 50 MG tablet Take 50 mg by mouth at bedtime as needed for sleep.     No current facility-administered medications for this visit.    PHYSICAL EXAMINATION: ECOG PERFORMANCE STATUS: {CHL ONC ECOG [phone]}  There were no vitals filed for this visit. There were no vitals filed for this visit.  BREAST:*** No palpable masses or nodules in either right or left breasts. No palpable axillary supraclavicular or  infraclavicular adenopathy no breast tenderness or nipple discharge. (exam performed in the presence of a chaperone)  LABORATORY DATA:  I have reviewed the data as listed    Latest Ref Rng & Units 09/19/2022    8:33 AM 08/01/2022    8:42 AM 03/28/2022    7:58 AM  CMP  Glucose 70 - 99 mg/dL 101  99  115   BUN 8 - 27 mg/dL _0 Creatinine 0.57 - 1.00 mg/dL 0.67  0.80  0.62   Sodium 134 - 144 mmol/L 135  139  138   Potassium 3.5 - 5.2 mmol/L 4.1  4.3  3.7   Chloride 96 - 106 mmol/L 98  101  106   CO2 20 - 29 mmol/L _1 Calcium 8.7 - 10.3 mg/dL 10.2  9.8  9.8   Total Protein 6.5 - 8.1 g/dL   7.9   Total Bilirubin 0.3 - 1.2 mg/dL   0.4   Alkaline Phos 38 - 126 U/L   89   AST 15 - 41 U/L   15   ALT 0 - 44 U/L   11     Lab Results  Component Value Date   WBC 4.8 09/19/2022   HGB 11.9 09/19/2022   HCT 35.4 09/19/2022   MCV 88 09/19/2022   PLT 239 09/19/2022   NEUTROABS 2.4 09/19/2022    ASSESSMENT & PLAN:  No problem-specific Assessment & Plan notes found for this encounter.    No orders of the defined types were placed in this encounter.  The  patient has a good understanding of the overall plan. she agrees with it. she will call with any problems that may develop before the next visit here. Total time spent: 30 mins including face to face time and time spent for planning, charting and co-ordination of care   Suzzette Righter, [place] 10/22/22    I Gardiner Coins am acting as a Education administrator for [employer]  ***

## 2022-10-24 ENCOUNTER — Telehealth: Payer: Self-pay

## 2022-10-24 NOTE — Telephone Encounter (Signed)
[name], counseling intern, called patient to schedule a counseling session.   The patient shared they are feeling like they are carrying a heavy load because they were recently diagnosed with cancer for the fourth time. The patient shared they have had seven surgeries this year.   The patient reported they were at a doctor's appointment for their mother and did not have access to their calendar.   The counselor told the patient they will call them tomorrow to set up an appointment for next week.   [name],  Counseling Intern  [phone] [email or web address]

## 2022-10-25 ENCOUNTER — Ambulatory Visit (INDEPENDENT_AMBULATORY_CARE_PROVIDER_SITE_OTHER): Payer: 59 | Admitting: Student

## 2022-10-25 ENCOUNTER — Inpatient Hospital Stay: Payer: 59 | Attending: Hematology and Oncology | Admitting: Hematology and Oncology

## 2022-10-25 ENCOUNTER — Other Ambulatory Visit: Payer: Self-pay

## 2022-10-25 ENCOUNTER — Encounter: Payer: Self-pay | Admitting: *Deleted

## 2022-10-25 ENCOUNTER — Telehealth: Payer: Self-pay

## 2022-10-25 VITALS — BP 136/80 | HR 76 | Temp 98.0°F | Resp 18

## 2022-10-25 VITALS — BP 143/62 | HR 67 | Temp 97.6°F | Wt 182.2 lb

## 2022-10-25 DIAGNOSIS — I427 Cardiomyopathy due to drug and external agent: Secondary | ICD-10-CM

## 2022-10-25 DIAGNOSIS — Z1501 Genetic susceptibility to malignant neoplasm of breast: Secondary | ICD-10-CM | POA: Diagnosis not present

## 2022-10-25 DIAGNOSIS — C50412 Malignant neoplasm of upper-outer quadrant of left female breast: Secondary | ICD-10-CM

## 2022-10-25 DIAGNOSIS — Z171 Estrogen receptor negative status [ER-]: Secondary | ICD-10-CM

## 2022-10-25 DIAGNOSIS — Z803 Family history of malignant neoplasm of breast: Secondary | ICD-10-CM | POA: Insufficient documentation

## 2022-10-25 DIAGNOSIS — Z1509 Genetic susceptibility to other malignant neoplasm: Secondary | ICD-10-CM | POA: Insufficient documentation

## 2022-10-25 DIAGNOSIS — S21002D Unspecified open wound of left breast, subsequent encounter: Secondary | ICD-10-CM

## 2022-10-25 DIAGNOSIS — Z9013 Acquired absence of bilateral breasts and nipples: Secondary | ICD-10-CM | POA: Insufficient documentation

## 2022-10-25 DIAGNOSIS — N6310 Unspecified lump in the right breast, unspecified quadrant: Secondary | ICD-10-CM | POA: Diagnosis not present

## 2022-10-25 DIAGNOSIS — Z923 Personal history of irradiation: Secondary | ICD-10-CM

## 2022-10-25 DIAGNOSIS — Z79811 Long term (current) use of aromatase inhibitors: Secondary | ICD-10-CM | POA: Diagnosis not present

## 2022-10-25 NOTE — Progress Notes (Signed)
Referring Provider [name], MD [place],  [place] [address]   CC:  Chief Complaint  Patient presents with   Follow-up      Jordan Willis is an 72 y.o. female.  HPI: Patient is a 72 year old female with history of breast cancer.  Patient is status post breast reconstruction with implant exchange performed on 12/19/2021 and subsequent removal of bilateral implants performed on 02/05/2022 by [name] due to right-sided exposure.  Patient has history of radiation to the bilateral breasts.  Patient presents to the clinic today for increased drainage and bleeding from her left breast wound.  Patient was last seen in the clinic on 10/08/2022.  At this visit, patient reported she is still experiencing drainage on her gauze when she would perform her dressing changes.  She reported that she had some tenderness at the mastectomy site.  She also reported at this visit that her oncologist, [name] had performed an ultrasound of the left mastectomy site which revealed a small pocket of fluid, and she also had an ultrasound on the right side where she had a mass that would require further biopsy.  On exam, there is a pinpoint incisional wound to the left mastectomy site with a cloudy/brownish thin drainage.  There is no surrounding induration or erythema the possibility of a more definitive intervention via surgery to close the pocket and mitigate the risk of recurrent infections was discussed with the patient at this visit.  Patient wanted to have her right-sided biopsy addressed before considering any revisional surgery to the left side.  Patient was taking doxycycline at that time and was instructed to finish her course of antibiotics and follow-up in 1 month.  Patient is accompanied by her daughter today.  Today, patient reports she is doing okay.  Patient reports that she noticed drainage from her left breast yesterday.  She reports she has some tenderness in the area.  She  denies any fevers or chills.  Patient also reports she had a lump to her right mastectomy scar which was biopsied.  Patient reports that she was told today that this biopsy showed cancer.  Patient states that she is going to meet with [name] soon to discuss surgery for removal.  Patient is requesting that if she were to have further surgery on the left breast wound, she would like to have it done at the same time as surgery with [name].  Review of Systems General: Denies fevers or chills  Physical Exam    10/25/2022   11:35 AM 10/25/2022    8:32 AM 10/08/2022   10:09 AM  Vitals with BMI  Weight  182 lbs 3 oz   Systolic 364 680 321  Diastolic 80 62 72  Pulse 76 67 72    General:  No acute distress,  Alert and oriented, Non-Toxic, Normal speech and affect Chaperone present on exam.  On exam, patient is sitting upright in no acute distress.  There is a small pinpoint wound to the left breast mastectomy scar.  There is thin brownish/white and a little bit of bloody drainage coming from the wound.  There is some mild tenderness to palpation.  There is no surrounding erythema.  There is no fluctuance noted on exam.  Assessment/Plan  Malignant neoplasm of upper-outer quadrant of left breast in female, estrogen receptor negative ([place]) - Plan: CBC   I discussed with the patient that this could be infection versus the effects of radiation.  I discussed with the patient to continue to monitor the area closely.  I discussed with the patient and that if she develops any fevers or chills, the area becomes red, the area becomes more painful, or if she has any other worsening symptoms, to let us know and we will place her on antibiotics.  We will also plan to get a CBC.  Order was given to patient.  I discussed with the patient to continue to reinforce the area with gauze, maxipads or ABD pads.  Dr. Lovena Le also had the opportunity to examine the patient and discussed the plan with her.  The  possibility of wound excision, irrigation of the area and closure was discussed with the patient.  We will try to coordinate with [name] to see if it is possible to have this done at the same time as the right breast cancer removal.  I instructed the patient and the patient's daughter to call in the meantime if she has any questions or concerns.  Clance Boll 10/25/2022, 1:05 PM

## 2022-10-25 NOTE — Telephone Encounter (Signed)
Jordan Willis, would you mind reaching out to Jordan Willis today and see if she can come in. Im happy to see her tomorrow if she can't come in today. I think Jordan Willis saw her last but I have seen her recently as well. Jordan Willis has seen her as well. Im not sure what the next step is going to be but Jordan Willis is in clinic today. Thanks

## 2022-10-25 NOTE — Telephone Encounter (Signed)
Jordan Willis, counseling intern, called patient to schedule counseling session.   Patient scheduled their next counseling session for Monday, 1/8 at [place],  Counseling Intern [phone] [email or web address]

## 2022-10-25 NOTE — Assessment & Plan Note (Addendum)
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% status post lumpectomy radiation and 5 years of tamoxifen completed 04/16/2009  Recurrence: 05/23/2021: Screening mammogram detected right breast mass 10 o'clock position 1.2 cm, biopsy with grade 3 IDC ER 20%, PR 0%, HER2 positive, Ki-67 5%) Bilateral mastectomies: Right mastectomy: Grade 3 IDC with DCIS 3.6 cm, 0/6 lymph nodes negative, ER 20%, PR 20%, HER2 positive by IHC, Ki-67 50% Received 4 cycles of Herceptin stopped because of decline in ejection fraction BRCA 2 mutation ---------------------------------------------------------------------------------------------------------------------------------------------------- Non-ischemic cardiomyopathy: NYHA clas 2: follows with cardiology. Heart cath: Normal. MUGA planned  Headaches: brain MRI 04/30/22: No mets, chronic microvascular ischemic changes   Recommendation: Anastrozole started 05/03/22 stopped 06/05/22, Switched to Exemestane 06/19/2022 Exemestane toxicities: Tolerating it much better.   Relapse: 10/05/2022:Palpable lump in the right mastectomy scar: 1.4 x 1.3 x 1.3 cm and a 0.4 cm mass (presence of small residual fluid): Biopsy revealed grade 3 IDC with necrosis ER 90% PR 0% Ki-67 70%, HER2 3+ positive  Treatment plan: Restaging scans Surgical excision of them mass Consideration for adjuvant Kadcyla (we will check another echocardiogram given her history of cardiomyopathy) Continuation of antiestrogen therapy with  exemestane  Patient is concerned that she does not have enough space in the chest wall for another port placement.  She will discuss this with [name].  Plastic surgery issues: Patient still is not healed from left chest wall that appears to be still nonhealing wound.  She is working with Dr. Lovena Le with plastic surgery.  Apparently she might need a skin graft.  Briefly discussed the pros and cons of doing treatment with Kadcyla including the risk of cardiomyopathy getting worse as well as neuropathy and fatigue.  Telephone visit after the scans to discuss results.

## 2022-10-26 ENCOUNTER — Telehealth: Payer: Self-pay | Admitting: Hematology and Oncology

## 2022-10-26 ENCOUNTER — Telehealth: Payer: Self-pay | Admitting: Physician Assistant

## 2022-10-26 LAB — CBC
Hematocrit: 38 % (ref 34.0–46.6)
Hemoglobin: 12.6 g/dL (ref 11.1–15.9)
MCH: 29.4 pg (ref 26.6–33.0)
MCHC: 33.2 g/dL (ref 31.5–35.7)
MCV: 89 fL (ref 79–97)
Platelets: 305 10*3/uL (ref 150–450)
RBC: 4.28 x10E6/uL (ref 3.77–5.28)
RDW: 13.2 % (ref 11.7–15.4)
WBC: 5.1 10*3/uL (ref 3.4–10.8)

## 2022-10-26 NOTE — Telephone Encounter (Signed)
Scheduled appointment per 1/4 los. Patient is aware.

## 2022-10-26 NOTE — Telephone Encounter (Signed)
I spoke with Jordan Willis on the phone, I gave her an update on her CBC which was unremarkable with no elevation in WBC.  We will continue to monitor her closely.  She understands to call with any questions or concerns.

## 2022-10-29 ENCOUNTER — Ambulatory Visit (HOSPITAL_COMMUNITY)
Admission: RE | Admit: 2022-10-29 | Discharge: 2022-10-29 | Disposition: A | Discharge status: Home / Self Care | Payer: Medicare Other | Source: Ambulatory Visit | Attending: Hematology and Oncology | Admitting: Hematology and Oncology

## 2022-10-29 ENCOUNTER — Encounter: Payer: Self-pay | Admitting: Adult Health

## 2022-10-29 ENCOUNTER — Encounter: Payer: Self-pay | Admitting: *Deleted

## 2022-10-29 ENCOUNTER — Other Ambulatory Visit: Payer: Self-pay | Admitting: Endocrinology

## 2022-10-29 DIAGNOSIS — Z0189 Encounter for other specified special examinations: Secondary | ICD-10-CM | POA: Diagnosis not present

## 2022-10-29 DIAGNOSIS — E042 Nontoxic multinodular goiter: Secondary | ICD-10-CM

## 2022-10-29 DIAGNOSIS — I447 Left bundle-branch block, unspecified: Secondary | ICD-10-CM | POA: Diagnosis not present

## 2022-10-29 DIAGNOSIS — C50412 Malignant neoplasm of upper-outer quadrant of left female breast: Secondary | ICD-10-CM | POA: Insufficient documentation

## 2022-10-29 DIAGNOSIS — Z171 Estrogen receptor negative status [ER-]: Secondary | ICD-10-CM

## 2022-10-29 DIAGNOSIS — G4730 Sleep apnea, unspecified: Secondary | ICD-10-CM | POA: Insufficient documentation

## 2022-10-29 DIAGNOSIS — E119 Type 2 diabetes mellitus without complications: Secondary | ICD-10-CM | POA: Diagnosis not present

## 2022-10-29 DIAGNOSIS — E785 Hyperlipidemia, unspecified: Secondary | ICD-10-CM | POA: Insufficient documentation

## 2022-10-29 DIAGNOSIS — I427 Cardiomyopathy due to drug and external agent: Secondary | ICD-10-CM

## 2022-10-29 DIAGNOSIS — I10 Essential (primary) hypertension: Secondary | ICD-10-CM | POA: Insufficient documentation

## 2022-10-29 LAB — ECHOCARDIOGRAM COMPLETE
Area-P 1/2: 3.12 cm2
S' Lateral: 3.8 cm
Single Plane A4C EF: 30.9 %

## 2022-10-29 NOTE — Progress Notes (Signed)
Jordan Willis, counseling intern, met with patient for their scheduled counseling session.   The patient shared they were diagnosed with cancer for the second time in a year. The patient shared, over the last year, they have had "one medical thing after the other."   The patient shared in their professional life and personal life they have always taken on the "caregiver" role and it is difficult now to be the person who is cared for.   The patient shared they made dinner for their sister everyday for a year when their sister was passing away from cancer. The patient shared when their sister passed away their sister took a part of the patient with her.   The patient explained they are holding stress in their body. The counselor taught the patient progressive muscle relaxation.   The patient scheduled their next counseling session for Thursday, January 18th at [facility],  Counseling Intern  [phone] [email or web address]

## 2022-10-29 NOTE — Progress Notes (Signed)
  Echocardiogram 2D Echocardiogram has been performed.  Jordan Willis 10/29/2022, 9:07 AM

## 2022-11-02 ENCOUNTER — Other Ambulatory Visit: Payer: Self-pay | Admitting: General Surgery

## 2022-11-02 DIAGNOSIS — C50911 Malignant neoplasm of unspecified site of right female breast: Secondary | ICD-10-CM | POA: Diagnosis not present

## 2022-11-06 ENCOUNTER — Ambulatory Visit (HOSPITAL_COMMUNITY)
Admission: RE | Admit: 2022-11-06 | Discharge: 2022-11-06 | Disposition: A | Discharge status: Home / Self Care | Payer: 59 | Source: Ambulatory Visit | Attending: Hematology and Oncology | Admitting: Hematology and Oncology

## 2022-11-06 DIAGNOSIS — Z171 Estrogen receptor negative status [ER-]: Secondary | ICD-10-CM | POA: Insufficient documentation

## 2022-11-06 DIAGNOSIS — J929 Pleural plaque without asbestos: Secondary | ICD-10-CM | POA: Diagnosis not present

## 2022-11-06 DIAGNOSIS — K429 Umbilical hernia without obstruction or gangrene: Secondary | ICD-10-CM | POA: Diagnosis not present

## 2022-11-06 DIAGNOSIS — C50412 Malignant neoplasm of upper-outer quadrant of left female breast: Secondary | ICD-10-CM | POA: Diagnosis not present

## 2022-11-06 DIAGNOSIS — M47816 Spondylosis without myelopathy or radiculopathy, lumbar region: Secondary | ICD-10-CM | POA: Diagnosis not present

## 2022-11-06 DIAGNOSIS — I700 Atherosclerosis of aorta: Secondary | ICD-10-CM | POA: Diagnosis not present

## 2022-11-06 DIAGNOSIS — K760 Fatty (change of) liver, not elsewhere classified: Secondary | ICD-10-CM | POA: Diagnosis not present

## 2022-11-06 DIAGNOSIS — M160 Bilateral primary osteoarthritis of hip: Secondary | ICD-10-CM | POA: Diagnosis not present

## 2022-11-06 DIAGNOSIS — Z853 Personal history of malignant neoplasm of breast: Secondary | ICD-10-CM | POA: Diagnosis not present

## 2022-11-06 DIAGNOSIS — R590 Localized enlarged lymph nodes: Secondary | ICD-10-CM | POA: Diagnosis not present

## 2022-11-06 DIAGNOSIS — N200 Calculus of kidney: Secondary | ICD-10-CM | POA: Diagnosis not present

## 2022-11-06 DIAGNOSIS — J479 Bronchiectasis, uncomplicated: Secondary | ICD-10-CM | POA: Diagnosis not present

## 2022-11-06 DIAGNOSIS — Z85830 Personal history of malignant neoplasm of bone: Secondary | ICD-10-CM | POA: Diagnosis not present

## 2022-11-06 MED ORDER — TECHNETIUM TC 99M MEDRONATE IV KIT
20.0000 | PACK | Freq: Once | INTRAVENOUS | Status: AC | PRN
  Administered 2022-11-06: 17.5 via INTRAVENOUS

## 2022-11-06 MED ORDER — IOHEXOL 300 MG/ML  SOLN
100.0000 mL | Freq: Once | INTRAMUSCULAR | Status: AC | PRN
  Administered 2022-11-06: 100 mL via INTRAVENOUS

## 2022-11-07 ENCOUNTER — Ambulatory Visit: Payer: Medicare Other | Admitting: Plastic Surgery

## 2022-11-08 ENCOUNTER — Encounter: Payer: Self-pay | Admitting: Plastic Surgery

## 2022-11-08 ENCOUNTER — Other Ambulatory Visit: Payer: Medicare Other

## 2022-11-08 ENCOUNTER — Ambulatory Visit (INDEPENDENT_AMBULATORY_CARE_PROVIDER_SITE_OTHER): Payer: 59 | Admitting: Plastic Surgery

## 2022-11-08 VITALS — BP 129/63 | HR 77

## 2022-11-08 DIAGNOSIS — L905 Scar conditions and fibrosis of skin: Secondary | ICD-10-CM

## 2022-11-08 DIAGNOSIS — Z9889 Other specified postprocedural states: Secondary | ICD-10-CM

## 2022-11-08 NOTE — Progress Notes (Signed)
Ms. Diperna returns today for evaluation and scheduling for her procedure.  [name] has agreed to allowing Jordan Willis to have the revision of her left scar done at the same time as her mastectomy on the right.  There is no drainage today and no evidence of opening in the wound.  At length with her the possibility that this will not fix the problem but is the best solution I have at this time.  Will proceed with left scar revision at her request.

## 2022-11-08 NOTE — Progress Notes (Signed)
Jordan Willis, counseling intern, met with patient for their scheduled counseling session.   The patient shared they find it hard to "slow down" and to not "take care of everyone." The patient shared the doctor reflected their wounds aren't healing because they are physically doing too much.   The patient shared their was a time when she fainted at work and diagnosed with chronic fatigue. At the time, the patient was working two jobs and raising two children as a single mother    The patient reflected they have always been the caregiver in the family. The patient shared when they turned 73 years old they moved from living with their grandmother to living with their mother. When the patient began living with her mother, the patient was expected to take care of their younger siblings and to care for the house.   The patient and counselor spoke about what the patient wished she had heard from her caregivers as a 72 year old. The counselor asked the patient to spend time this week telling the 72 year old within herself what she wished she had heard as a child.   The counselor plans on calling the patient next week to schedule her next counseling session.   Jordan Willis,  Counseling Intern  [phone] [email or web address]

## 2022-11-09 ENCOUNTER — Inpatient Hospital Stay (HOSPITAL_BASED_OUTPATIENT_CLINIC_OR_DEPARTMENT_OTHER): Payer: 59 | Admitting: Hematology and Oncology

## 2022-11-09 DIAGNOSIS — Z171 Estrogen receptor negative status [ER-]: Secondary | ICD-10-CM | POA: Diagnosis not present

## 2022-11-09 DIAGNOSIS — C50412 Malignant neoplasm of upper-outer quadrant of left female breast: Secondary | ICD-10-CM | POA: Diagnosis not present

## 2022-11-09 NOTE — Assessment & Plan Note (Signed)
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% status post lumpectomy radiation and 5 years of tamoxifen completed 04/16/2009   Recurrence: 05/23/2021: Screening mammogram detected right breast mass 10 o'clock position 1.2 cm, biopsy with grade 3 IDC ER 20%, PR 0%, HER2 positive, Ki-67 5%) Bilateral mastectomies: Right mastectomy: Grade 3 IDC with DCIS 3.6 cm, 0/6 lymph nodes negative, ER 20%, PR 20%, HER2 positive by IHC, Ki-67 50% Received 4 cycles of Herceptin stopped because of decline in ejection fraction BRCA 2 mutation ---------------------------------------------------------------------------------------------------------------------------------------------------- Non-ischemic cardiomyopathy: NYHA clas 2: follows with cardiology. Heart cath: Normal. MUGA planned  Headaches: brain MRI 04/30/22: No mets, chronic microvascular ischemic changes   Recommendation: Anastrozole started 05/03/22 stopped 06/05/22, Switched to Exemestane 06/19/2022 Exemestane toxicities: Tolerating it much better.   Relapse: 10/05/2022:Palpable lump in the right mastectomy scar: 1.4 x 1.3 x 1.3 cm and a 0.4 cm mass (presence of small residual fluid): Biopsy revealed grade 3 IDC with necrosis ER 90% PR 0% Ki-67 70%, HER2 3+ positive   Treatment plan: CT CAP 11/06/2022: Right chest wall nodule, persistent sclerosis along the sternum Bone scan 11/06/2022: Intense sternal/manubrial tracer activity with lucency and sclerosis compatible with bone  metastases. Surgical excision of the mass 11/04/2022: Echocardiogram: EF 30 to 35%   Continuation of antiestrogen therapy with exemestane Based on the above scan results I will request radiology to see if the sternum can be biopsied.

## 2022-11-09 NOTE — Progress Notes (Signed)
HEMATOLOGY-ONCOLOGY TELEPHONE VISIT PROGRESS NOTE  I connected with our patient on 11/09/22 at  8:45 AM EST by telephone and verified that I am speaking with the correct person using two identifiers.  I discussed the limitations, risks, security and privacy concerns of performing an evaluation and management service by telephone and the availability of in person appointments.  I also discussed with the patient that there may be a patient responsible charge related to this service. The patient expressed understanding and agreed to proceed.   History of Present Illness: Telephone family called to discuss the results of the scans.  The patient reports that the tumor on the chest wall appears to be very much adjacent to the pacemaker.  Previously there was a gap between the nodule on the pacemaker and currently she tells me that there was not any space on her recent examination.  Oncology History  Malignant neoplasm of upper-outer quadrant of left breast in female, estrogen receptor negative ([place])  02/02/2005 Initial Diagnosis   Left breast needle core biopsy showed invasive mammary cancer   02/11/2005 Breast MRI   Left breast upper outer quadrant: 4.4 x 2.6 x 3.7 cm mass and a smaller adjacent mass measuring 1 x 0.8 x 0.4 cm   03/02/2005 - 07/20/2005 Neo-Adjuvant Chemotherapy   AC4 followed by Taxotere 4   08/03/2005 Surgery   Left breast lumpectomy: 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%, PR 0%, HER-2 negative, Ki-67 31%   09/26/2005 - 11/13/2005 Radiation Therapy   Adjuvant radiation therapy   03/18/2008 Initial Biopsy   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   03/22/2008 Surgery   Right breast lumpectomy 0.9 cm DCIS ER 90% PR 98% stage 0   04/11/2008 Procedure   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   05/10/2008 - 07/02/2008 Radiation Therapy   Adjuvant radiation therapy    07/17/2008 - 04/16/2009 Anti-estrogen oral therapy   Femara then switched to tamoxifen which was discontinued in 2010 due to concern about uterine cancer   05/31/2021 Cancer Staging   Staging form: Breast, AJCC 7th Edition - Clinical: Stage IA (rT1c, N0, M0) - Signed by Nicholas Lose, MD on 05/31/2021 Stage prefix: Recurrence Laterality: Right Biopsy of metastatic site performed: No Estrogen receptor status: Positive Progesterone receptor status: Negative HER2 status: Positive   09/12/2021 Cancer Staging   Staging form: Breast, AJCC 7th Edition - Pathologic: Stage IIA (T2, N0, cM0) - Signed by Nicholas Lose, MD on 09/12/2021 Estrogen receptor status: Positive Progesterone receptor status: Positive HER2 status: Negative   10/05/2022 Relapse/Recurrence   Palpable lump in the right mastectomy scar: 1.4 x 1.3 x 1.3 cm and a 0.4 cm mass (presence of small residual fluid): Biopsy revealed grade 3 IDC with necrosis ER 90% PR 0% Ki-67 70%, HER2 3+ positive   Breast cancer of upper-outer quadrant of right female breast ([place])  05/23/2021 Relapse/Recurrence   Screening detected right breast mass at 10 o'clock position measuring 1.2 cm, ultrasound-guided biopsy revealed grade 3 IDC ER 20%, PR 0%, HER2 positive 3+, Ki-67 50%   09/04/2021 Surgery   Bilateral mastectomies Left mastectomy: Benign Right mastectomy: Grade 3 IDC with DCIS 3.6 cm, 0/6 lymph nodes negative, ER 20%, PR 0%, HER2 positive by IHC, Ki-67 50%   11/21/2021 - 01/23/2022 Chemotherapy   Patient is on Treatment Plan : BREAST Trastuzumab q21d     04/2022 -  Anti-estrogen oral therapy  Anastrozole daily     REVIEW OF SYSTEMS:   Constitutional: Denies fevers, chills or abnormal weight loss All other systems were reviewed with the patient and are negative. 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 status post neo adjuvant chemotherapy with FEC 4 followed by Taxotere 4 completed 07/20/2005 status post lumpectomy 1.2 cm, grade 3, triple negative, Ki-67 31%, status post radiation completed 11/13/2005 2009: 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%, 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% status post lumpectomy radiation and 5 years of tamoxifen completed 04/16/2009   Recurrence: 05/23/2021: Screening mammogram detected right breast mass 10 o'clock position 1.2 cm, biopsy with grade 3 IDC ER 20%, PR 0%, HER2 positive, Ki-67 5%) Bilateral mastectomies: Right mastectomy: Grade 3 IDC with DCIS 3.6 cm, 0/6 lymph nodes negative, ER 20%, PR 20%, HER2 positive by IHC, Ki-67 50% Received 4 cycles of Herceptin stopped because of decline in ejection fraction BRCA 2 mutation ---------------------------------------------------------------------------------------------------------------------------------------------------- Non-ischemic cardiomyopathy: NYHA clas 2: follows with cardiology. Heart cath: Normal. MUGA planned  Headaches: brain MRI 04/30/22: No mets, chronic microvascular ischemic changes   Recommendation: Anastrozole started 05/03/22 stopped 06/05/22, Switched to Exemestane 06/19/2022 Exemestane toxicities: Tolerating it much better.   Relapse: 10/05/2022:Palpable lump in the right mastectomy scar: 1.4 x 1.3 x 1.3 cm and a 0.4 cm mass (presence of small residual fluid): Biopsy revealed grade 3 IDC with necrosis ER 90% PR 0% Ki-67 70%, HER2 3+ positive   Treatment plan: CT CAP 11/06/2022: Right chest wall nodule, persistent sclerosis along the sternum Bone scan 11/06/2022: Intense sternal/manubrial tracer activity with lucency and sclerosis compatible with bone metastases. Surgical excision of  the mass 11/04/2022: Echocardiogram: EF 30 to 35%   Continuation of antiestrogen therapy with exemestane Based on the above scan results I will request radiology to see if the sternum can be biopsied.  I will discuss with [name] about getting her in for surgical excision of the nodule. She tells me that the nodule is very close to the pacemaker. I will discuss with Dr. Terri Skains regarding the low ejection fraction as well as a nodule being close to the pacemaker.  Also about the ejection fraction being quite low and whether it contraindicates anti-HER2 therapy.    I discussed the assessment and treatment plan with the patient. The patient was provided an opportunity to ask questions and all were answered. The patient agreed with the plan and demonstrated an understanding of the instructions. The patient was advised to call back or seek an in-person evaluation if the symptoms worsen or if the condition fails to improve as anticipated.   I provided 69mnutes of non-face-to-face time and coordination of care during this encounter.  This includes time for charting and coordination of care   VHarriette Ohara MD

## 2022-11-10 ENCOUNTER — Telehealth: Payer: Self-pay | Admitting: *Deleted

## 2022-11-10 NOTE — Telephone Encounter (Signed)
No pac req for [id number], [id number], 13102 - [id number]

## 2022-11-14 ENCOUNTER — Telehealth: Payer: Self-pay

## 2022-11-14 ENCOUNTER — Ambulatory Visit (INDEPENDENT_AMBULATORY_CARE_PROVIDER_SITE_OTHER): Payer: 59 | Admitting: Physician Assistant

## 2022-11-14 ENCOUNTER — Encounter: Payer: Self-pay | Admitting: Physician Assistant

## 2022-11-14 VITALS — BP 139/67 | HR 71

## 2022-11-14 DIAGNOSIS — N6459 Other signs and symptoms in breast: Secondary | ICD-10-CM

## 2022-11-14 DIAGNOSIS — Z9889 Other specified postprocedural states: Secondary | ICD-10-CM

## 2022-11-14 NOTE — Telephone Encounter (Signed)
[name], counseling intern, called patient to schedule a counseling session.   The patient did not answer the phone. The counselor left a message and will try again next week.   [name],  Counseling Intern  [phone] [email or web address]

## 2022-11-14 NOTE — Progress Notes (Signed)
   Referring Provider [name], MD [place],  [place] [address]   CC:  Chief Complaint  Patient presents with   Follow-up      Jordan Willis is an 72 y.o. female.    HPI:   This is a 72 year old female with a history of breast cancer.  She is status post breast reconstruction with implant exchange performed on 12/19/2021 and subsequent removal of bilateral implants performed on 02/05/2022 by [name] due to right-sided exposure.  The patient does have a history of radiation to the bilateral breasts.  She was most recently seen in the clinic on 11/08/2022.  Since her last office visit she notes she has had some drainage from the left breast wound.  This has been an ongoing issue with waxing and waning drainage that has been treated with antibiotics several times.  The patient is scheduled for right-sided lumpectomy by [name] for right-sided recurrence.  At that time Dr. Lovena Le will be doing skin excision on the left.  The patient notes that over the last several days she has had some purulent drainage from the left breast wound.  She denies any fever or any other systemic signs of illness.  She notes this is similar to previous.  She notes that she developed pressure and pain to the left breast, once the wound started leaking a large matted purulence came out and the pain subsided.  Review of Systems General: Negative for fever, positive for left-sided breast wound  Physical Exam    11/14/2022    1:44 PM 11/08/2022    2:18 PM 10/25/2022   11:35 AM  Vitals with BMI  Systolic 263 335 456  Diastolic 67 63 80  Pulse 71 77 76    General:  No acute distress,  Alert and oriented, Non-Toxic, Normal speech and affect Bilateral mastectomy flaps are viable, left breast with purulence from a small wound along the incision, no surrounding redness, minimal tenderness, no induration.  Right breast flaps viable with no overlying redness warmth.  Assessment/Plan Ms.  Perella presents today for follow-up evaluation of her left breast wound.  She does have some purulence from the wound, this has nearly completely drained.  She has no induration or surrounding redness.  She is afebrile.  Given its characteristic appearance and ongoing issues with the left side I do feel it is reasonable to treat with an antibiotic.  She has had good results with doxycycline based on previous infections.  Given the purulence is tracking along the skin I do not feel a wound would be warranted at this time as it would likely show contaminant.  I will treat with doxycycline.  The patient has a follow-up of evaluation in 1 week for her preop appointment.  If she continues to have ongoing or worsening drainage or any new or worsening signs or symptoms she will follow-up prior to that, if her symptoms improve she will continue using antibiotics to be seen at the preoperative visit.  Both the patient and her daughter verbalized understanding and agreement to today's plan had no further questions or concerns.  [name] Lemar Bakos 11/14/2022, 2:14 PM

## 2022-11-15 ENCOUNTER — Telehealth: Payer: Self-pay

## 2022-11-15 ENCOUNTER — Telehealth: Payer: Self-pay | Admitting: Physician Assistant

## 2022-11-15 MED ORDER — DOXYCYCLINE HYCLATE 100 MG PO TABS: 100.0000 mg | ORAL_TABLET | Freq: Two times a day (BID) | ORAL | 0 refills | Status: DC

## 2022-11-15 NOTE — Addendum Note (Signed)
Addended byPenni Bombard, Lahna Nath on: 11/15/2022 09:35 AM   Modules accepted: Orders

## 2022-11-15 NOTE — Telephone Encounter (Signed)
Jordan Willis, counseling intern, called patient to schedule their next session.   Patient scheduled their next session for Monday, 1/29 at [place],  Counseling Intern  [phone] [email or web address]

## 2022-11-15 NOTE — Telephone Encounter (Signed)
Pt went to pick medication and [facility] on [address] states they do not have an order for it.  Can you please check to see if it went through and give pt a call back.

## 2022-11-19 ENCOUNTER — Telehealth: Payer: Self-pay | Admitting: *Deleted

## 2022-11-19 NOTE — Progress Notes (Signed)
Jordan Willis, counseling intern, met with patient for their scheduled counseling session.   The patient shared they feel overwhelmingly tired. The patient shared after our last session they have been trying to rest more. The patient shared this sometimes looks sweeping half the kitchen then resting and coming back to sweep the rest later.   The patient shared difficult experiences in their childhood and the ways it has affecting them in adulthood. One of these ways is not being honest with the people they are close to regarding the amount of pain they are experiencing.   The counselor told the patient they would share with them a list of mental health resources that take medicaid the next time we meet.   The patient scheduled their next counseling session for Thursday, Feb 8th at [place],  Counseling Intern,  [phone] [email or web address]

## 2022-11-19 NOTE — Telephone Encounter (Signed)
Received VM from pt.  RN attempt x1 to return call, no answer. LVM for pt to return call to the office.  

## 2022-11-20 NOTE — Progress Notes (Unsigned)
Cardiology Office Note Date:  11/22/2022  Patient ID:  Jordan Willis, Jordan Willis 06/16/1951, MRN [id number] PCP:  [name], MD  Cardiologist:  Dr. Terri Skains Electrophysiologist: Dr. Lovena Le    Chief Complaint: pt requested early visit  History of Present Illness: Jordan Willis is a 72 y.o. female with history of breast cancer  s/p surgical (b/l mastectomies) and chemo with development of LBBB and NICM  Referred to Dr. Lovena Le Aug 2023 for consideration of CRT-D with persistent CM despite tx, planned for implant.  Underwent device implant 08/24/22  Had her wound check visit 11/15 with the device clinic, felt to be healing well, pt reported stinging type pain in the area, appeared to be healing well, stable device findings  Called 12/11 with concerns of a knot near the device Was seen 12/13, by RN and in clinic EP MD< not felt to be device/pocket related and to f/u with her oncologist  I dont see that she has seen Dr. Terri Skains or Dr. Lovena Le since her implant  She saw heme-onc 11/09/22,  Treatment plan: CT CAP 11/06/2022: Right chest wall nodule, persistent sclerosis along the sternum Bone scan 11/06/2022: Intense sternal/manubrial tracer activity with lucency and sclerosis compatible with bone metastases. Surgical excision of the mass 11/04/2022: Echocardiogram: EF 30 to 35%   Continuation of antiestrogen therapy with exemestane Based on the above scan results I will request radiology to see if the sternum can be biopsied.   I will discuss with [name] about getting her in for surgical excision of the nodule. She tells me that the nodule is very close to the pacemaker. I will discuss with Dr. Terri Skains regarding the low ejection fraction as well as a nodule being close to the pacemaker.  Also about the ejection fraction being quite low and whether it contraindicates anti-HER2 therapy.  She saw plastics 11/14/22, decribed: Since her last office visit she notes she has had some drainage from the  left breast wound.  This has been an ongoing issue with waxing and waning drainage that has been treated with antibiotics several times.  The patient is scheduled for right-sided lumpectomy by [name] for right-sided recurrence.  At that time Dr. Drema Pry (plastic surgery) will be doing skin excision on the left. The patient notes that over the last several days she has had some purulent drainage from the left breast wound.  She denies any fever or any other systemic signs of illness.  She notes this is similar to previous.  She notes that she developed pressure and pain to the left breast, once the wound started leaking a large matted purulence came out and the pain subsided. STARTED ON DOXYCYCLINE   TODAY She is accompanied by her son and daughter. They come  today with concerns about her upcoming surgery and heart condition, weakness. Cardiac-wise, no CP, palpitations or cardiac awareness No rest SOB, some mild DOE No near syncope or syncope.   She is scheduled on 12/04/22 for excision of R breast mass. They are very concerned about her ICD (noted R side), near the mass and her planned surgery, she is also planned for port placement at the same time as well as plastic surgeon is going to excise skin/wound are from the LEFT side.  They report that the breast surgeon felt it was fine to remove the mass without concerns of the device. But the patient/kids are concerned Also concerned about her heart weakness, and why it has not gotten any better.  Device information [employer] CRT-D implanted 08/24/22   Past Medical History:  Diagnosis Date   Anemia    Anginal pain (HCC)    Anxiety    Arthritis    Lumbar spine DDD.   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   Diabetes mellitus without complication  (HCC)    Type II   Dyspnea    When patient gets sick uses Pro-Air inhaler   Frequent sinus infections    GERD (gastroesophageal reflux disease)    occ   Goiter    History of cardiomegaly    History of cataract    Bilateral   History of kidney stones    08/28/2018 currently has a large kidney stone, asymptomatic at this time   History of migraine    History of uterine fibroid    History of uterine prolapse    Hyperlipidemia    Hypertension    LBBB (left bundle branch block)    Menopause 03/03/2012   Nasal turbinate hypertrophy 11/29/2016   Left inferior    Personal history of chemotherapy    Personal history of radiation therapy    Pneumonia    as a child   PONV (postoperative nausea and vomiting)    Sinusitis 2014   Sleep apnea    No CPAP   SUI (stress urinary incontinence, female)    SVD (spontaneous vaginal delivery)    x 2   Thyroid nodule     Past Surgical History:  Procedure Laterality Date   ABDOMINAL HYSTERECTOMY     BILATERAL SALPINGOOPHORECTOMY Bilateral    BIV ICD INSERTION CRT-D N/A 08/24/2022   Procedure: BIV ICD INSERTION CRT-D;  Surgeon: Evans Lance, MD;  Location: [facility];  Service: Cardiovascular;  Laterality: N/A;   BLADDER SUSPENSION N/A 05/16/2017   Procedure: TRANSVAGINAL TAPE (TVT) PROCEDURE;  Surgeon: Everett Graff, MD;  Location: [facility];  Service: Gynecology;  Laterality: N/A;   BREAST BIOPSY Right 2009   BREAST IMPLANT REMOVAL Bilateral 02/05/2022   Procedure: Removal bilateral breast implants;  Surgeon: [name], MD;  Location: [facility];  Service: Plastics;  Laterality: Bilateral;   BREAST LUMPECTOMY Right 2009   BREAST LUMPECTOMY Left 2006   BREAST RECONSTRUCTION WITH PLACEMENT OF TISSUE EXPANDER AND FLEX HD (ACELLULAR HYDRATED DERMIS) Bilateral 09/04/2021   Procedure: BILATERAL BREAST RECONSTRUCTION WITH PLACEMENT OF TISSUE EXPANDER AND FLEX HD (ACELLULAR HYDRATED DERMIS);  Surgeon: [name], MD;  Location: [facility];   Service: Plastics;  Laterality: Bilateral;   CARDIAC CATHETERIZATION     CATARACT EXTRACTION Left 10/23/2017   CATARACT EXTRACTION Right 11/06/2017   COLONOSCOPY  10/22/2009   Normal.  Repeat 5 years.  [name]   CYSTOSCOPY N/A 05/16/2017   Procedure: CYSTOSCOPY;  Surgeon: Everett Graff, MD;  Location: [facility];  Service: Gynecology;  Laterality: N/A;   DEBRIDEMENT AND CLOSURE WOUND Bilateral 10/03/2021   Procedure: Debridement bilateral mastectomy flaps;  Surgeon: [name], MD;  Location: [facility];  Service: Plastics;  Laterality: Bilateral;  1.5 hour   EYE SURGERY     bilateral cataracts   LEFT HEART CATH AND CORONARY ANGIOGRAPHY N/A 02/09/2020   Procedure: LEFT HEART CATH AND CORONARY ANGIOGRAPHY;  Surgeon: Nigel Mormon, MD;  Location: [facility];  Service: Cardiovascular;  Laterality:  N/A;   LEFT HEART CATH AND CORONARY ANGIOGRAPHY N/A 03/27/2022   Procedure: LEFT HEART CATH AND CORONARY ANGIOGRAPHY;  Surgeon: Adrian Prows, MD;  Location: [facility];  Service: Cardiovascular;  Laterality: N/A;   MASTECTOMY W/ SENTINEL NODE BIOPSY Right 09/04/2021   Procedure: BILATERAL MASTECTOMIES WITH RIGHT SENTINEL LYMPH NODE BIOPSY;  Surgeon: Stark Klein, MD;  Location: [facility];  Service: General;  Laterality: Right;   REMOVAL OF BILATERAL TISSUE EXPANDERS WITH PLACEMENT OF BILATERAL BREAST IMPLANTS Bilateral 10/03/2021   Procedure: REMOVAL OF BILATERAL TISSUE EXPANDERS WITH REPLACEMENT OF TISSUE EXPANDERS;  Surgeon: [name], MD;  Location: [facility];  Service: Plastics;  Laterality: Bilateral;   REMOVAL OF BILATERAL TISSUE EXPANDERS WITH PLACEMENT OF BILATERAL BREAST IMPLANTS Bilateral 12/19/2021   Procedure: REMOVAL OF BILATERAL TISSUE EXPANDERS WITH PLACEMENT OF BILATERAL BREAST IMPLANTS;  Surgeon: [name], MD;  Location: [facility];  Service: Plastics;  Laterality: Bilateral;   ROBOTIC ASSISTED TOTAL HYSTERECTOMY N/A 05/16/2017   Procedure: ROBOTIC ASSISTED TOTAL  HYSTERECTOMY;  Surgeon: Delsa Bern, MD;  Location: [facility];  Service: Gynecology;  Laterality: N/A;   ROTATOR CUFF REPAIR Bilateral    SHOULDER ARTHROSCOPY WITH ROTATOR CUFF REPAIR AND SUBACROMIAL DECOMPRESSION Right 09/03/2018   Procedure: Right shoulder manipulation under anesthesia, exam under anesthesia, mini open rotator cuff repair, subacromial decompression;  Surgeon: Susa Day, MD;  Location: [facility];  Service: Orthopedics;  Laterality: Right;  90 mins   TUBAL LIGATION     WISDOM TOOTH EXTRACTION      Current Outpatient Medications  Medication Sig Dispense Refill   albuterol (VENTOLIN HFA) 108 (90 Base) MCG/ACT inhaler Inhale 2 puffs into the lungs every 6 (six) hours as needed for wheezing or shortness of breath.     b complex vitamins capsule Take 1 capsule by mouth daily.     baclofen (LIORESAL) 10 MG tablet Take 10 mg by mouth 2 (two) times daily as needed for muscle spasms.     bumetanide (BUMEX) 1 MG tablet TAKE 1 TABLET BY MOUTH EVERY DAY 90 tablet 1   cetirizine (ZYRTEC) 10 MG tablet Take 10 mg by mouth in the morning.     Cholecalciferol (VITAMIN D3) 25 MCG (1000 UT) CAPS Take 1,000 Units by mouth in the morning.     doxycycline (VIBRA-TABS) 100 MG tablet Take 1 tablet (100 mg total) by mouth 2 (two) times daily. 20 tablet 0   exemestane (AROMASIN) 25 MG tablet Take 1 tablet (25 mg total) by mouth daily after breakfast. 90 tablet 3   FARXIGA 10 MG TABS tablet TAKE 1 TABLET BY MOUTH EVERY DAY BEFORE BREAKFAST 90 tablet 0   fluticasone (FLONASE) 50 MCG/ACT nasal spray Place 1 spray into both nostrils in the morning.     GLUCOSAMINE-CHONDROITIN PO Take 1 tablet by mouth every evening.      glucose blood (ONETOUCH VERIO) test strip TEST ONCE DAILY AS DIRECTED 90     ibuprofen (ADVIL) 200 MG tablet Take 400 mg by mouth every 6 (six) hours as needed for mild pain or moderate pain.     metFORMIN (GLUCOPHAGE-XR) 500 MG 24 hr tablet Take 500 mg by mouth 2 (two) times daily.      metoprolol succinate (TOPROL-XL) 25 MG 24 hr tablet TAKE 1 TABLET (25 MG TOTAL) BY MOUTH IN THE MORNING. HOLD IF SYSTOLIC BLOOD PRESSURE (TOP BLOOD PRESSURE NUMBER) LESS THAN 100 MMHG OR HEART RATE LESS THAN 60 BPM (PULSE). 90 tablet 3   Multiple Vitamin (  MULTIVITAMIN WITH MINERALS) TABS tablet Take 1 tablet by mouth daily. Centrum Silver for Women 50+     nitroGLYCERIN (NITROSTAT) 0.4 MG SL tablet Place 0.4 mg under the tongue every 5 (five) minutes as needed for chest pain.     Olopatadine HCl 0.2 % SOLN Place 1 drop into both eyes daily. Pataday     OneTouch Delica Lancets 87O MISC Apply topically.     Probiotic Product (PROBIOTIC DAILY PO) Take 1 capsule by mouth in the morning.     rosuvastatin (CRESTOR) 20 MG tablet Take 20 mg by mouth at bedtime.     sacubitril-valsartan (ENTRESTO) 49-51 MG Take 1 tablet by mouth 2 (two) times daily. 180 tablet 1   sertraline (ZOLOFT) 50 MG tablet Take 50 mg by mouth in the morning.     spironolactone (ALDACTONE) 25 MG tablet TAKE 1 TABLET BY MOUTH EVERY DAY IN THE MORNING 90 tablet 0   diclofenac Sodium (VOLTAREN) 1 % GEL Apply 1 Application topically 4 (four) times daily as needed (pain).     ondansetron (ZOFRAN) 4 MG tablet Take 1 tablet (4 mg total) by mouth every 6 (six) hours as needed for nausea or vomiting. 20 tablet 0   ondansetron (ZOFRAN-ODT) 4 MG disintegrating tablet Take 1 tablet (4 mg total) by mouth every 8 (eight) hours as needed for nausea or vomiting. 20 tablet 0   oxyCODONE (OXY IR/ROXICODONE) 5 MG immediate release tablet Take 1 tablet (5 mg total) by mouth every 6 (six) hours as needed for severe pain. 30 tablet 0   oxyCODONE (ROXICODONE) 5 MG immediate release tablet Take 1 tablet (5 mg total) by mouth every 8 (eight) hours as needed for up to 7 days for severe pain. (Patient not taking: Reported on 11/22/2022) 20 tablet 0   No current facility-administered medications for this visit.    Allergies:   Bactrim  [sulfamethoxazole-trimethoprim], Aspirin, Sulfa antibiotics, and Tape   Social History:  The patient  reports that she has never smoked. She has never used smokeless tobacco. She reports that she does not drink alcohol and does not use drugs.   Family History:  The patient's family history includes Breast cancer in her sister and sister; Cancer in her sister and sister; Cancer (age of onset: 80) in her mother; Diabetes in her father; Hypertension in her mother.  ROS:  Please see the history of present illness.    All other systems are reviewed and otherwise negative.   PHYSICAL EXAM:  VS:  BP 116/74   Pulse 72   Ht 5' 7.5" (1.715 m)   Wt 176 lb 12.8 oz (80.2 kg)   SpO2 99%   BMI 27.28 kg/m  BMI: Body mass index is 27.28 kg/m. Well nourished, well developed, in no acute distress HEENT: normocephalic, atraumatic Neck: no JVD, carotid bruits or masses Cardiac:  RRR; no significant murmurs, no rubs, or gallops Lungs:  CTA b/l, no wheezing, rhonchi or rales Abd: soft, nontender MS: no deformity or atrophy Ext: no edema Skin: warm and dry, no rash Neuro:  No gross deficits appreciated Psych: euthymic mood, full affect  ICD site is stable, no tethering or discomfort It does sit low right chest  She is s/p b/l mastectomy R side where her new mass is, I am not able to appreciate particularly what is mass vs scar tissue with firm areas throughout the ara of mastectomy/puckered/skin folds  No obvious drainage currently on the left   EKG:  not done today  Device interrogation done today and reviewed by myself:  Battery and lead measurements are stable No arrhythmias Acute implant outputs remain  10/29/22: TTE 1. Left ventricular ejection fraction, by estimation, is 30 to 35%. The  left ventricle has moderately decreased function. The left ventricle  demonstrates regional wall motion abnormalities. Abnormal (paradoxical)  septal motion, consistent with left  bundle branch block       There is mild asymmetric left ventricular hypertrophy of the  basal-septal segment. Left ventricular diastolic parameters are consistent  with Grade I diastolic dysfunction (impaired relaxation).   2. Right ventricular systolic function is low normal. The right  ventricular size is normal. There is normal pulmonary artery systolic  pressure. The estimated right ventricular systolic pressure is 20.9 mmHg.   3. The mitral valve is normal in structure. Trivial mitral valve  regurgitation. No evidence of mitral stenosis.   4. Pulmonic valve regurgitation is moderate.   5. The aortic valve is tricuspid. There is mild calcification of the  aortic valve. Aortic valve regurgitation is not visualized. Aortic valve  sclerosis/calcification is present, without any evidence of aortic  stenosis.   6. The inferior vena cava is normal in size with greater than 50%  respiratory variability, suggesting right atrial pressure of 3 mmHg.    Left Heart Catheterization 03/27/22:  LV: 118/3, EDP 11 mmHg.  Ao 128/56, mean 84 mmHg.  No pressure gradient across the aortic valve. LVEF 30 to 35% with global hypokinesis.  LV is mildly dilated. LM: Large caliber vessels, smooth and normal. LAD: Large vessel, gives origin to several small diagonals.  Smooth and normal.  Mid to distal segment is tortuous and may suggest hypertensive heart disease. RI: Large vessel, gives origin to large lateral branches.  Smooth and normal. CX: Large vessel, gives origin to moderate-sized OM1, 2 and continues as OM 3.  Smooth and normal. RCA: Dominant, continues as a PDA.  Smooth and normal.  Impression: Findings consistent with nonischemic dilated cardiomyopathy.  Severe LV systolic dysfunction.  Recent Labs: 02/16/2022: Magnesium 2.2 03/06/2022: NT-Pro BNP 338 03/28/2022: ALT 11 09/19/2022: BUN 13; Creatinine, Ser 0.67; Potassium 4.1; Sodium 135 10/25/2022: Hemoglobin 12.6; Platelets 305  No results found for requested labs within  last 365 days.   CrCl cannot be calculated (Patient's most recent lab result is older than the maximum 21 days allowed.).   Wt Readings from Last 3 Encounters:  11/22/22 176 lb 12.8 oz (80.2 kg)  11/21/22 175 lb (79.4 kg)  10/25/22 182 lb 3.2 oz (82.6 kg)     Other studies reviewed: Additional studies/records reviewed today include: summarized above  ASSESSMENT AND PLAN:  CRT-D Intact function No programming changes made Not yet to 91 days  Proximity of planned surgery and port placement are concerning from a device perspective.  NICM Chronic CHF She appear well compensated CoreVue early, wobbles up/down threshold so far Exam does not support volume OL Discussed would be very early to see any improvement in EF yet from a device perspective C/w Dr. Terri Skains  I will send a message to Drs Terri Skains and Dr. Lovena Le regarding her upcoming surgery/proximity to her device, planned port and nay device management savageries we may/may not need to pursue There is NO plans for radiation  I discussed with them that decision on chemo would be left to her oncologist and Dr. Terri Skains given her CM.   ADDEND: Dr. Lovena Le felt confident that the surgeon would avoid device pocket, and surgical decision/management to their team, not felt  to need our input. I will ask my MA to make the patient aware. I have sent my note and message to Dr. Terri Skains as I said I would to make sure he is aware of upcoming surgery and likely chemo   Disposition: F/u with Dr. Lovena Le in a month, sooner if needed, they have follow up with Dr. Terri Skains in place late Feb  Current medicines are reviewed at length with the patient today.  The patient did not have any concerns regarding medicines.  Venetia Night, PA-C 11/22/2022 1:13 PM     [address] [place] [id number] [phone] (office)  [phone] (fax)

## 2022-11-21 ENCOUNTER — Encounter: Payer: Self-pay | Admitting: Physician Assistant

## 2022-11-21 ENCOUNTER — Ambulatory Visit (INDEPENDENT_AMBULATORY_CARE_PROVIDER_SITE_OTHER): Payer: 59 | Admitting: Physician Assistant

## 2022-11-21 ENCOUNTER — Other Ambulatory Visit: Payer: Self-pay | Admitting: Cardiology

## 2022-11-21 VITALS — BP 123/73 | HR 74 | Ht 67.5 in | Wt 175.0 lb

## 2022-11-21 DIAGNOSIS — E7800 Pure hypercholesterolemia, unspecified: Secondary | ICD-10-CM | POA: Diagnosis not present

## 2022-11-21 DIAGNOSIS — I5042 Chronic combined systolic (congestive) and diastolic (congestive) heart failure: Secondary | ICD-10-CM

## 2022-11-21 DIAGNOSIS — Z9889 Other specified postprocedural states: Secondary | ICD-10-CM

## 2022-11-21 DIAGNOSIS — I429 Cardiomyopathy, unspecified: Secondary | ICD-10-CM

## 2022-11-21 DIAGNOSIS — I10 Essential (primary) hypertension: Secondary | ICD-10-CM

## 2022-11-21 DIAGNOSIS — E1165 Type 2 diabetes mellitus with hyperglycemia: Secondary | ICD-10-CM | POA: Diagnosis not present

## 2022-11-21 DIAGNOSIS — I428 Other cardiomyopathies: Secondary | ICD-10-CM

## 2022-11-21 MED ORDER — ONDANSETRON 4 MG PO TBDP: 4.0000 mg | ORAL_TABLET | Freq: Three times a day (TID) | ORAL | 0 refills | Status: DC | PRN

## 2022-11-21 MED ORDER — OXYCODONE HCL 5 MG PO TABS: 5.0000 mg | ORAL_TABLET | Freq: Three times a day (TID) | ORAL | 0 refills | Status: AC | PRN

## 2022-11-21 NOTE — Progress Notes (Signed)
   Patient ID: Jordan Willis, female    DOB: 08/19/1951, 71 y.o.   MRN: [id number]  Chief Complaint  Patient presents with   Pre-op Exam      ICD-10-CM   1. S/P breast reconstruction  Z98.890        History of Present Illness: Jordan Willis is a 71 y.o.  female  with a history of breast cancer and bilateral breast radiation s/p reconstruction with implant change performed 12/19/2021 and subsequent removal 02/05/2022 by Dr. Pace.  She presents for preoperative evaluation for upcoming procedure, right-sided lumpectomy with Dr. Byerly and left-sided scar revision, scheduled for 12/04/2022 with Dr.  Taylor .  The patient has experienced PONV and constipation from recent surgeries.  She will make the anesthesiologist aware prior to surgery and we discussed postoperative management.  She denies any personal or family history of blood clots or clotting disorder.  She does not smoke or use nicotine-containing products.  She is not on any blood thinners.  She had a pacemaker placed in the past year and plans to follow-up with the surgeon who placed that tomorrow.  The newly diagnosed lesion on the right side is abutting the pacemaker.  Patient tells me that she also plans to have a port placed on the right side by Dr. Byerly at time of surgery.  She plans to hold her ibuprofen 3 days prior to surgery and all vitamins and supplements 1 week prior to surgery.  She does have a tape allergy/sensitive skin, will consider dressing her left-sided excision with a soft silicone bordered Mepilex dressing.  She states that she is no longer draining from the left side, but is looking forward to surgical management given her months of medical management.  Summary of Previous Visit: She was seen most recently on 11/14/2022.  At that time, she had continued to experience drainage from left-sided breast wound which has failed medical management over the course of 6+ months.  The condition has been waxing and waning.  She was  without any systemic illness.  Plan was for doxycycline and follow-up for the preoperative exam, as scheduled.  She discussed left-sided breast wound revision with Dr. Taylor 11/08/2022 and for to be done on the same day as her right-sided lumpectomy.  Discussed risks of failure among others, patient voiced understanding and was agreeable to the plan.  PMH Significant for: Breast cancer and bilateral breast radiation s/p reconstruction with subsequent complications leading to removal of implants, type II DM, LBBB, HTN, HLD, GERD, PONV, and NICM.   Past Medical History: Allergies: Allergies  Allergen Reactions   Bactrim [Sulfamethoxazole-Trimethoprim] Shortness Of Breath    Shortness of breath, difficulty breathing, headache, rash, fatigue   Aspirin Palpitations and Other (See Comments)    Heart fluttering, pt takes 40.5 mg (half of 81 mg) daily Other reaction(s): bradycardia, Not available   Sulfa Antibiotics Hives   Tape Rash    Tears skin off    Current Medications:  Current Outpatient Medications:    albuterol (VENTOLIN HFA) 108 (90 Base) MCG/ACT inhaler, Inhale 2 puffs into the lungs every 6 (six) hours as needed for wheezing or shortness of breath., Disp: , Rfl:    Ascorbic Acid (VITAMIN C PO), Take 500 mg by mouth daily., Disp: , Rfl:    b complex vitamins capsule, Take 1 capsule by mouth daily., Disp: , Rfl:    baclofen (LIORESAL) 10 MG tablet, PLEASE SEE ATTACHED FOR DETAILED DIRECTIONS, Disp: , Rfl:      bumetanide (BUMEX) 1 MG tablet, TAKE 1 TABLET BY MOUTH EVERY DAY, Disp: 90 tablet, Rfl: 1   cetirizine (ZYRTEC) 10 MG tablet, Take 10 mg by mouth in the morning., Disp: , Rfl:    Cholecalciferol (VITAMIN D3) 25 MCG (1000 UT) CAPS, Take 1,000 Units by mouth in the morning., Disp: , Rfl:    doxycycline (VIBRA-TABS) 100 MG tablet, Take 1 tablet (100 mg total) by mouth 2 (two) times daily., Disp: 20 tablet, Rfl: 0   exemestane (AROMASIN) 25 MG tablet, Take 1 tablet (25 mg total) by  mouth daily after breakfast., Disp: 90 tablet, Rfl: 3   fluticasone (FLONASE) 50 MCG/ACT nasal spray, Place 1 spray into both nostrils in the morning., Disp: , Rfl:    GLUCOSAMINE-CHONDROITIN PO, Take 1 tablet by mouth every evening. , Disp: , Rfl:    glucose blood (ONETOUCH VERIO) test strip, TEST ONCE DAILY AS DIRECTED 90, Disp: , Rfl:    ibuprofen (ADVIL) 200 MG tablet, Take 400 mg by mouth every 6 (six) hours as needed for mild pain or moderate pain., Disp: , Rfl:    metFORMIN (GLUCOPHAGE-XR) 500 MG 24 hr tablet, Take 500 mg by mouth 2 (two) times daily., Disp: , Rfl:    metoprolol succinate (TOPROL-XL) 25 MG 24 hr tablet, TAKE 1 TABLET (25 MG TOTAL) BY MOUTH IN THE MORNING. HOLD IF SYSTOLIC BLOOD PRESSURE (TOP BLOOD PRESSURE NUMBER) LESS THAN 100 MMHG OR HEART RATE LESS THAN 60 BPM (PULSE). (Patient taking differently: Take 25 mg by mouth daily. Hold if systolic blood pressure (top blood pressure number) is less than 100 MMHG or heart rate less than 60 BPM (pulse)), Disp: 90 tablet, Rfl: 3   Multiple Vitamin (MULTIVITAMIN WITH MINERALS) TABS tablet, Take 1 tablet by mouth daily. Centrum Silver for Women 50+, Disp: , Rfl:    nitroGLYCERIN (NITROSTAT) 0.4 MG SL tablet, Place 0.4 mg under the tongue every 5 (five) minutes as needed for chest pain., Disp: , Rfl:    Olopatadine HCl 0.2 % SOLN, Place 1 drop into both eyes daily. [place], Disp: , Rfl:    ondansetron (ZOFRAN) 4 MG tablet, Take 1 tablet (4 mg total) by mouth every 6 (six) hours as needed for nausea or vomiting., Disp: 20 tablet, Rfl: 0   ondansetron (ZOFRAN-ODT) 4 MG disintegrating tablet, Take 1 tablet (4 mg total) by mouth every 8 (eight) hours as needed for nausea or vomiting., Disp: 20 tablet, Rfl: 0   OneTouch Delica Lancets 33G MISC, Apply topically., Disp: , Rfl:    oxyCODONE (OXY IR/ROXICODONE) 5 MG immediate release tablet, Take 1 tablet (5 mg total) by mouth every 6 (six) hours as needed for severe pain., Disp: 30 tablet, Rfl: 0    oxyCODONE (ROXICODONE) 5 MG immediate release tablet, Take 1 tablet (5 mg total) by mouth every 8 (eight) hours as needed for up to 7 days for severe pain., Disp: 20 tablet, Rfl: 0   Probiotic Product (PROBIOTIC DAILY PO), Take 1 capsule by mouth in the morning., Disp: , Rfl:    rosuvastatin (CRESTOR) 20 MG tablet, Take 20 mg by mouth daily., Disp: , Rfl:    sacubitril-valsartan (ENTRESTO) 49-51 MG, Take 1 tablet by mouth 2 (two) times daily., Disp: 180 tablet, Rfl: 1   sertraline (ZOLOFT) 50 MG tablet, Take 50 mg by mouth in the morning., Disp: , Rfl:    solifenacin (VESICARE) 10 MG tablet, Take 10 mg by mouth in the morning., Disp: , Rfl:    traZODone (DESYREL)   50 MG tablet, Take 50 mg by mouth at bedtime as needed for sleep., Disp: , Rfl:    FARXIGA 10 MG TABS tablet, TAKE 1 TABLET BY MOUTH EVERY DAY BEFORE BREAKFAST, Disp: 90 tablet, Rfl: 0   spironolactone (ALDACTONE) 25 MG tablet, TAKE 1 TABLET BY MOUTH EVERY DAY IN THE MORNING, Disp: 90 tablet, Rfl: 0  Past Medical Problems: Past Medical History:  Diagnosis Date   Anemia    Anginal pain (HCC)    Anxiety    Arthritis    Lumbar spine DDD.   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   Diabetes mellitus without complication (HCC)    Type II   Dyspnea    When patient gets sick uses Pro-Air inhaler   Frequent sinus infections    GERD (gastroesophageal reflux disease)    occ   Goiter    History of cardiomegaly    History of cataract    Bilateral   History of kidney stones    08/28/2018 currently has a large kidney stone, asymptomatic at this time   History of migraine    History of uterine fibroid    History of uterine prolapse    Hyperlipidemia    Hypertension    LBBB (left bundle branch block)    Menopause 03/03/2012   Nasal turbinate hypertrophy  11/29/2016   Left inferior    Personal history of chemotherapy    Personal history of radiation therapy    Pneumonia    as a child   PONV (postoperative nausea and vomiting)    Sinusitis 2014   Sleep apnea    No CPAP   SUI (stress urinary incontinence, female)    SVD (spontaneous vaginal delivery)    x 2   Thyroid nodule     Past Surgical History: Past Surgical History:  Procedure Laterality Date   ABDOMINAL HYSTERECTOMY     BILATERAL SALPINGOOPHORECTOMY Bilateral    BIV ICD INSERTION CRT-D N/A 08/24/2022   Procedure: BIV ICD INSERTION CRT-D;  Surgeon: Taylor, Gregg W, MD;  Location: [facility];  Service: Cardiovascular;  Laterality: N/A;   BLADDER SUSPENSION N/A 05/16/2017   Procedure: TRANSVAGINAL TAPE (TVT) PROCEDURE;  Surgeon: Roberts, Angela, MD;  Location: [facility];  Service: Gynecology;  Laterality: N/A;   BREAST BIOPSY Right 2009   BREAST IMPLANT REMOVAL Bilateral 02/05/2022   Procedure: Removal bilateral breast implants;  Surgeon: Pace, Collier S, MD;  Location: [facility];  Service: Plastics;  Laterality: Bilateral;   BREAST LUMPECTOMY Right 2009   BREAST LUMPECTOMY Left 2006   BREAST RECONSTRUCTION WITH PLACEMENT OF TISSUE EXPANDER AND FLEX HD (ACELLULAR HYDRATED DERMIS) Bilateral 09/04/2021   Procedure: BILATERAL BREAST RECONSTRUCTION WITH PLACEMENT OF TISSUE EXPANDER AND FLEX HD (ACELLULAR HYDRATED DERMIS);  Surgeon: Pace, Collier S, MD;  Location: [facility];  Service: Plastics;  Laterality: Bilateral;   CARDIAC CATHETERIZATION     CATARACT EXTRACTION Left 10/23/2017   CATARACT EXTRACTION Right 11/06/2017   COLONOSCOPY  10/22/2009   Normal.  Repeat 5 years.  [name]   CYSTOSCOPY N/A 05/16/2017   Procedure: CYSTOSCOPY;  Surgeon: Roberts, Angela, MD;  Location: [facility];  Service: Gynecology;  Laterality: N/A;   DEBRIDEMENT AND CLOSURE WOUND Bilateral 10/03/2021   Procedure: Debridement bilateral mastectomy flaps;  Surgeon: Pace, Collier S, MD;    Location: [facility];  Service:  Plastics;  Laterality: Bilateral;  1.5 hour   EYE SURGERY     bilateral cataracts   LEFT HEART CATH AND CORONARY ANGIOGRAPHY N/A 02/09/2020   Procedure: LEFT HEART CATH AND CORONARY ANGIOGRAPHY;  Surgeon: Patwardhan, Manish J, MD;  Location: [facility];  Service: Cardiovascular;  Laterality: N/A;   LEFT HEART CATH AND CORONARY ANGIOGRAPHY N/A 03/27/2022   Procedure: LEFT HEART CATH AND CORONARY ANGIOGRAPHY;  Surgeon: Ganji, Jay, MD;  Location: [facility];  Service: Cardiovascular;  Laterality: N/A;   MASTECTOMY W/ SENTINEL NODE BIOPSY Right 09/04/2021   Procedure: BILATERAL MASTECTOMIES WITH RIGHT SENTINEL LYMPH NODE BIOPSY;  Surgeon: Byerly, Faera, MD;  Location: [facility];  Service: General;  Laterality: Right;   REMOVAL OF BILATERAL TISSUE EXPANDERS WITH PLACEMENT OF BILATERAL BREAST IMPLANTS Bilateral 10/03/2021   Procedure: REMOVAL OF BILATERAL TISSUE EXPANDERS WITH REPLACEMENT OF TISSUE EXPANDERS;  Surgeon: Pace, Collier S, MD;  Location: [facility];  Service: Plastics;  Laterality: Bilateral;   REMOVAL OF BILATERAL TISSUE EXPANDERS WITH PLACEMENT OF BILATERAL BREAST IMPLANTS Bilateral 12/19/2021   Procedure: REMOVAL OF BILATERAL TISSUE EXPANDERS WITH PLACEMENT OF BILATERAL BREAST IMPLANTS;  Surgeon: Pace, Collier S, MD;  Location: [facility];  Service: Plastics;  Laterality: Bilateral;   ROBOTIC ASSISTED TOTAL HYSTERECTOMY N/A 05/16/2017   Procedure: ROBOTIC ASSISTED TOTAL HYSTERECTOMY;  Surgeon: Rivard, Sandra, MD;  Location: [facility];  Service: Gynecology;  Laterality: N/A;   ROTATOR CUFF REPAIR Bilateral    SHOULDER ARTHROSCOPY WITH ROTATOR CUFF REPAIR AND SUBACROMIAL DECOMPRESSION Right 09/03/2018   Procedure: Right shoulder manipulation under anesthesia, exam under anesthesia, mini open rotator cuff repair, subacromial decompression;  Surgeon: Beane, Jeffrey, MD;  Location: [facility];  Service: Orthopedics;  Laterality: Right;  90 mins   TUBAL LIGATION     WISDOM TOOTH EXTRACTION       Social History: Social History   Socioeconomic History   Marital status: Divorced    Spouse name: n/a   Number of children: 2   Years of education: 12   Highest education level: Not on file  Occupational History   Occupation: Retired    Employer: [employer]    Comment: [employer]  Tobacco Use   Smoking status: Never   Smokeless tobacco: Never  Vaping Use   Vaping Use: Never used  Substance and Sexual Activity   Alcohol use: No   Drug use: No   Sexual activity: Not Currently    Partners: Male    Birth control/protection: Surgical, Post-menopausal    Comment: Hysterectomy  Other Topics Concern   Not on file  Social History Narrative   Marital status: divorced; dating seriously x many years.       Children:  2 children; 1 grandchild.      Employment: [employer] full-time.      Lives: alone      Tobacco: none       Alcohol: none      Exercise:  Walking with sister two days per week.      Seatbelt: 100%      Guns: none      Right-handed   Caffeine: occasional coffee, hot tea or Krissi Willaims tea a few times per week   Social Determinants of Health   Financial Resource Strain: Not on file  Food Insecurity: Not on file  Transportation Needs: Not on file  Physical Activity: Not on file  Stress: Not on file  Social Connections: Not on file    Intimate Partner Violence: Not on file    Family History: Family History  Problem Relation Age of Onset   Cancer Mother 41       breast cancer   Hypertension Mother    Diabetes Father    Cancer Sister        breast cancer   Breast cancer Sister    Cancer Sister        breast cancer   Breast cancer Sister     Review of Systems: ROS Denies any recent chest pain, difficulty breathing, leg swelling, or fevers.  Physical Exam: Vital Signs BP 123/73 (BP Location: Left Arm, Patient Position: Sitting, Cuff Size: Large)   Pulse 74   Ht 5' 7.5" (1.715 m)   Wt 175 lb (79.4 kg)   SpO2 100%   BMI 27.00 kg/m    Physical Exam Constitutional:      General: Not in acute distress.    Appearance: Normal appearance. Not ill-appearing.  HENT:     Head: Normocephalic and atraumatic.  Eyes:     Pupils: Pupils are equal, round. Cardiovascular:     Rate and Rhythm: Normal rate.    Pulses: Normal pulses.  Pulmonary:     Effort: No respiratory distress or increased work of breathing.  Speaks in full sentences. Abdominal:     General: Abdomen is flat. No distension.   Musculoskeletal: Normal range of motion. No lower extremity swelling or edema.  Scattered spider veins, but no varicosities. Skin:    General: Skin is warm and dry.     Findings: No erythema or rash.  Neurological:     Mental Status: Alert and oriented to person, place, and time.  Psychiatric:        Mood and Affect: Mood normal.        Behavior: Behavior normal.    Assessment/Plan: The patient is scheduled for right-sided lumpectomy with Dr. Byerly and left some scar revision with Dr.  Taylor .  Risks, benefits, and alternatives of procedure discussed, questions answered and consent obtained.    Smoking Status: Non-smoker. Last Mammogram: S/p double mastectomy.  CT obtained 11/06/2022 with small nodule along right chest wall lateral to the pectoralis muscle concerning for malignant lesion.  Caprini Score: 7; Risk Factors include: Age, BMI greater than 25, history of breast cancer, and length of planned surgery. Recommendation for mechanical and possibly pharmacological prophylaxis. Will discuss with surgeon.  Encourage early ambulation.   Pictures obtained: 10/02/2022  Post-op Rx sent to pharmacy: Oxycodone and Zofran.    Patient was provided with the General Surgical Risk consent document and Pain Medication Agreement prior to their appointment.  They had adequate time to read through the risk consent documents and Pain Medication Agreement. We also discussed them in person together during this preop appointment. All of their  questions were answered to their satisfaction.  Recommended calling if they have any further questions.  Risk consent form and Pain Medication Agreement to be scanned into patient's chart.    Electronically signed by: Khadeejah Castner, PA-C 11/21/2022 10:05 AM 

## 2022-11-21 NOTE — H&P (View-Only) (Signed)
Patient ID: Jordan Willis, female    DOB: 16-Jul-1951, 72 y.o.   MRN: [id number]  Chief Complaint  Patient presents with   Pre-op Exam      ICD-10-CM   1. S/P breast reconstruction  Z98.890        History of Present Illness: Jordan Willis is a 72 y.o.  female  with a history of breast cancer and bilateral breast radiation s/p reconstruction with implant change performed 12/19/2021 and subsequent removal 02/05/2022 by [name].  She presents for preoperative evaluation for upcoming procedure, right-sided lumpectomy with [name] and left-sided scar revision, scheduled for 12/04/2022 with Dr.  Lovena Le .  The patient has experienced PONV and constipation from recent surgeries.  She will make the anesthesiologist aware prior to surgery and we discussed postoperative management.  She denies any personal or family history of blood clots or clotting disorder.  She does not smoke or use nicotine-containing products.  She is not on any blood thinners.  She had a pacemaker placed in the past year and plans to follow-up with the surgeon who placed that tomorrow.  The newly diagnosed lesion on the right side is abutting the pacemaker.  Patient tells me that she also plans to have a port placed on the right side by [name] at time of surgery.  She plans to hold her ibuprofen 3 days prior to surgery and all vitamins and supplements 1 week prior to surgery.  She does have a tape allergy/sensitive skin, will consider dressing her left-sided excision with a soft silicone bordered Mepilex dressing.  She states that she is no longer draining from the left side, but is looking forward to surgical management given her months of medical management.  Summary of Previous Visit: She was seen most recently on 11/14/2022.  At that time, she had continued to experience drainage from left-sided breast wound which has failed medical management over the course of 6+ months.  The condition has been waxing and waning.  She was  without any systemic illness.  Plan was for doxycycline and follow-up for the preoperative exam, as scheduled.  She discussed left-sided breast wound revision with Dr. Lovena Le 11/08/2022 and for to be done on the same day as her right-sided lumpectomy.  Discussed risks of failure among others, patient voiced understanding and was agreeable to the plan.  PMH Significant for: Breast cancer and bilateral breast radiation s/p reconstruction with subsequent complications leading to removal of implants, type II DM, LBBB, HTN, HLD, GERD, PONV, and NICM.   Past Medical History: Allergies: Allergies  Allergen Reactions   Bactrim [Sulfamethoxazole-Trimethoprim] Shortness Of Breath    Shortness of breath, difficulty breathing, headache, rash, fatigue   Aspirin Palpitations and Other (See Comments)    Heart fluttering, pt takes 40.5 mg (half of 81 mg) daily Other reaction(s): bradycardia, Not available   Sulfa Antibiotics Hives   Tape Rash    Tears skin off    Current Medications:  Current Outpatient Medications:    albuterol (VENTOLIN HFA) 108 (90 Base) MCG/ACT inhaler, Inhale 2 puffs into the lungs every 6 (six) hours as needed for wheezing or shortness of breath., Disp: , Rfl:    Ascorbic Acid (VITAMIN C PO), Take 500 mg by mouth daily., Disp: , Rfl:    b complex vitamins capsule, Take 1 capsule by mouth daily., Disp: , Rfl:    baclofen (LIORESAL) 10 MG tablet, PLEASE SEE ATTACHED FOR DETAILED DIRECTIONS, Disp: , Rfl:  bumetanide (BUMEX) 1 MG tablet, TAKE 1 TABLET BY MOUTH EVERY DAY, Disp: 90 tablet, Rfl: 1   cetirizine (ZYRTEC) 10 MG tablet, Take 10 mg by mouth in the morning., Disp: , Rfl:    Cholecalciferol (VITAMIN D3) 25 MCG (1000 UT) CAPS, Take 1,000 Units by mouth in the morning., Disp: , Rfl:    doxycycline (VIBRA-TABS) 100 MG tablet, Take 1 tablet (100 mg total) by mouth 2 (two) times daily., Disp: 20 tablet, Rfl: 0   exemestane (AROMASIN) 25 MG tablet, Take 1 tablet (25 mg total) by  mouth daily after breakfast., Disp: 90 tablet, Rfl: 3   fluticasone (FLONASE) 50 MCG/ACT nasal spray, Place 1 spray into both nostrils in the morning., Disp: , Rfl:    GLUCOSAMINE-CHONDROITIN PO, Take 1 tablet by mouth every evening. , Disp: , Rfl:    glucose blood (ONETOUCH VERIO) test strip, TEST ONCE DAILY AS DIRECTED 90, Disp: , Rfl:    ibuprofen (ADVIL) 200 MG tablet, Take 400 mg by mouth every 6 (six) hours as needed for mild pain or moderate pain., Disp: , Rfl:    metFORMIN (GLUCOPHAGE-XR) 500 MG 24 hr tablet, Take 500 mg by mouth 2 (two) times daily., Disp: , Rfl:    metoprolol succinate (TOPROL-XL) 25 MG 24 hr tablet, TAKE 1 TABLET (25 MG TOTAL) BY MOUTH IN THE MORNING. HOLD IF SYSTOLIC BLOOD PRESSURE (TOP BLOOD PRESSURE NUMBER) LESS THAN 100 MMHG OR HEART RATE LESS THAN 60 BPM (PULSE). (Patient taking differently: Take 25 mg by mouth daily. Hold if systolic blood pressure (top blood pressure number) is less than 100 MMHG or heart rate less than 60 BPM (pulse)), Disp: 90 tablet, Rfl: 3   Multiple Vitamin (MULTIVITAMIN WITH MINERALS) TABS tablet, Take 1 tablet by mouth daily. Centrum Silver for Women 50+, Disp: , Rfl:    nitroGLYCERIN (NITROSTAT) 0.4 MG SL tablet, Place 0.4 mg under the tongue every 5 (five) minutes as needed for chest pain., Disp: , Rfl:    Olopatadine HCl 0.2 % SOLN, Place 1 drop into both eyes daily. [place], Disp: , Rfl:    ondansetron (ZOFRAN) 4 MG tablet, Take 1 tablet (4 mg total) by mouth every 6 (six) hours as needed for nausea or vomiting., Disp: 20 tablet, Rfl: 0   ondansetron (ZOFRAN-ODT) 4 MG disintegrating tablet, Take 1 tablet (4 mg total) by mouth every 8 (eight) hours as needed for nausea or vomiting., Disp: 20 tablet, Rfl: 0   OneTouch Delica Lancets [id number] MISC, Apply topically., Disp: , Rfl:    oxyCODONE (OXY IR/ROXICODONE) 5 MG immediate release tablet, Take 1 tablet (5 mg total) by mouth every 6 (six) hours as needed for severe pain., Disp: 30 tablet, Rfl: 0    oxyCODONE (ROXICODONE) 5 MG immediate release tablet, Take 1 tablet (5 mg total) by mouth every 8 (eight) hours as needed for up to 7 days for severe pain., Disp: 20 tablet, Rfl: 0   Probiotic Product (PROBIOTIC DAILY PO), Take 1 capsule by mouth in the morning., Disp: , Rfl:    rosuvastatin (CRESTOR) 20 MG tablet, Take 20 mg by mouth daily., Disp: , Rfl:    sacubitril-valsartan (ENTRESTO) 49-51 MG, Take 1 tablet by mouth 2 (two) times daily., Disp: 180 tablet, Rfl: 1   sertraline (ZOLOFT) 50 MG tablet, Take 50 mg by mouth in the morning., Disp: , Rfl:    solifenacin (VESICARE) 10 MG tablet, Take 10 mg by mouth in the morning., Disp: , Rfl:    traZODone (DESYREL)  50 MG tablet, Take 50 mg by mouth at bedtime as needed for sleep., Disp: , Rfl:    FARXIGA 10 MG TABS tablet, TAKE 1 TABLET BY MOUTH EVERY DAY BEFORE BREAKFAST, Disp: 90 tablet, Rfl: 0   spironolactone (ALDACTONE) 25 MG tablet, TAKE 1 TABLET BY MOUTH EVERY DAY IN THE MORNING, Disp: 90 tablet, Rfl: 0  Past Medical Problems: Past Medical History:  Diagnosis Date   Anemia    Anginal pain (HCC)    Anxiety    Arthritis    Lumbar spine DDD.   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   Diabetes mellitus without complication (HCC)    Type II   Dyspnea    When patient gets sick uses Pro-Air inhaler   Frequent sinus infections    GERD (gastroesophageal reflux disease)    occ   Goiter    History of cardiomegaly    History of cataract    Bilateral   History of kidney stones    08/28/2018 currently has a large kidney stone, asymptomatic at this time   History of migraine    History of uterine fibroid    History of uterine prolapse    Hyperlipidemia    Hypertension    LBBB (left bundle branch block)    Menopause 03/03/2012   Nasal turbinate hypertrophy  11/29/2016   Left inferior    Personal history of chemotherapy    Personal history of radiation therapy    Pneumonia    as a child   PONV (postoperative nausea and vomiting)    Sinusitis 2014   Sleep apnea    No CPAP   SUI (stress urinary incontinence, female)    SVD (spontaneous vaginal delivery)    x 2   Thyroid nodule     Past Surgical History: Past Surgical History:  Procedure Laterality Date   ABDOMINAL HYSTERECTOMY     BILATERAL SALPINGOOPHORECTOMY Bilateral    BIV ICD INSERTION CRT-D N/A 08/24/2022   Procedure: BIV ICD INSERTION CRT-D;  Surgeon: Evans Lance, MD;  Location: [facility];  Service: Cardiovascular;  Laterality: N/A;   BLADDER SUSPENSION N/A 05/16/2017   Procedure: TRANSVAGINAL TAPE (TVT) PROCEDURE;  Surgeon: Everett Graff, MD;  Location: [facility];  Service: Gynecology;  Laterality: N/A;   BREAST BIOPSY Right 2009   BREAST IMPLANT REMOVAL Bilateral 02/05/2022   Procedure: Removal bilateral breast implants;  Surgeon: [name], MD;  Location: [facility];  Service: Plastics;  Laterality: Bilateral;   BREAST LUMPECTOMY Right 2009   BREAST LUMPECTOMY Left 2006   BREAST RECONSTRUCTION WITH PLACEMENT OF TISSUE EXPANDER AND FLEX HD (ACELLULAR HYDRATED DERMIS) Bilateral 09/04/2021   Procedure: BILATERAL BREAST RECONSTRUCTION WITH PLACEMENT OF TISSUE EXPANDER AND FLEX HD (ACELLULAR HYDRATED DERMIS);  Surgeon: [name], MD;  Location: [facility];  Service: Plastics;  Laterality: Bilateral;   CARDIAC CATHETERIZATION     CATARACT EXTRACTION Left 10/23/2017   CATARACT EXTRACTION Right 11/06/2017   COLONOSCOPY  10/22/2009   Normal.  Repeat 5 years.  [name]   CYSTOSCOPY N/A 05/16/2017   Procedure: CYSTOSCOPY;  Surgeon: Everett Graff, MD;  Location: [facility];  Service: Gynecology;  Laterality: N/A;   DEBRIDEMENT AND CLOSURE WOUND Bilateral 10/03/2021   Procedure: Debridement bilateral mastectomy flaps;  Surgeon: [name], MD;  Location: [facility];  Service:  Plastics;  Laterality: Bilateral;  1.5 hour   EYE SURGERY     bilateral cataracts   LEFT HEART CATH AND CORONARY ANGIOGRAPHY N/A 02/09/2020   Procedure: LEFT HEART CATH AND CORONARY ANGIOGRAPHY;  Surgeon: Nigel Mormon, MD;  Location: [facility];  Service: Cardiovascular;  Laterality: N/A;   LEFT HEART CATH AND CORONARY ANGIOGRAPHY N/A 03/27/2022   Procedure: LEFT HEART CATH AND CORONARY ANGIOGRAPHY;  Surgeon: Adrian Prows, MD;  Location: [facility];  Service: Cardiovascular;  Laterality: N/A;   MASTECTOMY W/ SENTINEL NODE BIOPSY Right 09/04/2021   Procedure: BILATERAL MASTECTOMIES WITH RIGHT SENTINEL LYMPH NODE BIOPSY;  Surgeon: Stark Klein, MD;  Location: [facility];  Service: General;  Laterality: Right;   REMOVAL OF BILATERAL TISSUE EXPANDERS WITH PLACEMENT OF BILATERAL BREAST IMPLANTS Bilateral 10/03/2021   Procedure: REMOVAL OF BILATERAL TISSUE EXPANDERS WITH REPLACEMENT OF TISSUE EXPANDERS;  Surgeon: [name], MD;  Location: [facility];  Service: Plastics;  Laterality: Bilateral;   REMOVAL OF BILATERAL TISSUE EXPANDERS WITH PLACEMENT OF BILATERAL BREAST IMPLANTS Bilateral 12/19/2021   Procedure: REMOVAL OF BILATERAL TISSUE EXPANDERS WITH PLACEMENT OF BILATERAL BREAST IMPLANTS;  Surgeon: [name], MD;  Location: [facility];  Service: Plastics;  Laterality: Bilateral;   ROBOTIC ASSISTED TOTAL HYSTERECTOMY N/A 05/16/2017   Procedure: ROBOTIC ASSISTED TOTAL HYSTERECTOMY;  Surgeon: Delsa Bern, MD;  Location: [facility];  Service: Gynecology;  Laterality: N/A;   ROTATOR CUFF REPAIR Bilateral    SHOULDER ARTHROSCOPY WITH ROTATOR CUFF REPAIR AND SUBACROMIAL DECOMPRESSION Right 09/03/2018   Procedure: Right shoulder manipulation under anesthesia, exam under anesthesia, mini open rotator cuff repair, subacromial decompression;  Surgeon: Susa Day, MD;  Location: [facility];  Service: Orthopedics;  Laterality: Right;  90 mins   TUBAL LIGATION     WISDOM TOOTH EXTRACTION       Social History: Social History   Socioeconomic History   Marital status: Divorced    Spouse name: n/a   Number of children: 2   Years of education: 12   Highest education level: Not on file  Occupational History   Occupation: Retired    Fish farm manager: [employer]    Comment: Psychologist, forensic  Tobacco Use   Smoking status: Never   Smokeless tobacco: Never  Vaping Use   Vaping Use: Never used  Substance and Sexual Activity   Alcohol use: No   Drug use: No   Sexual activity: Not Currently    Partners: Male    Birth control/protection: Surgical, Post-menopausal    Comment: Hysterectomy  Other Topics Concern   Not on file  Social History Narrative   Marital status: divorced; dating seriously x many years.       Children:  2 children; 1 grandchild.      Employment: Personnel officer.      Lives: alone      Tobacco: none       Alcohol: none      Exercise:  Walking with sister two days per week.      Seatbelt: 100%      Guns: none      Right-handed   Caffeine: occasional coffee, hot tea or [place] Marchiano tea a few times per week   Social Determinants of Health   Financial Resource Strain: Not on file  Food Insecurity: Not on file  Transportation Needs: Not on file  Physical Activity: Not on file  Stress: Not on file  Social Connections: Not on file  Intimate Partner Violence: Not on file    Family History: Family History  Problem Relation Age of Onset   Cancer Mother 57       breast cancer   Hypertension Mother    Diabetes Father    Cancer Sister        breast cancer   Breast cancer Sister    Cancer Sister        breast cancer   Breast cancer Sister     Review of Systems: ROS Denies any recent chest pain, difficulty breathing, leg swelling, or fevers.  Physical Exam: Vital Signs BP 123/73 (BP Location: Left Arm, Patient Position: Sitting, Cuff Size: Large)   Pulse 74   Ht 5' 7.5" (1.715 m)   Wt 175 lb (79.4 kg)   SpO2 100%   BMI 27.00 kg/m    Physical Exam Constitutional:      General: Not in acute distress.    Appearance: Normal appearance. Not ill-appearing.  HENT:     Head: Normocephalic and atraumatic.  Eyes:     Pupils: Pupils are equal, round. Cardiovascular:     Rate and Rhythm: Normal rate.    Pulses: Normal pulses.  Pulmonary:     Effort: No respiratory distress or increased work of breathing.  Speaks in full sentences. Abdominal:     General: Abdomen is flat. No distension.   Musculoskeletal: Normal range of motion. No lower extremity swelling or edema.  Scattered spider veins, but no varicosities. Skin:    General: Skin is warm and dry.     Findings: No erythema or rash.  Neurological:     Mental Status: Alert and oriented to person, place, and time.  Psychiatric:        Mood and Affect: Mood normal.        Behavior: Behavior normal.    Assessment/Plan: The patient is scheduled for right-sided lumpectomy with [name] and left some scar revision with Dr.  Lovena Le .  Risks, benefits, and alternatives of procedure discussed, questions answered and consent obtained.    Smoking Status: Non-smoker. Last Mammogram: S/p double mastectomy.  CT obtained 11/06/2022 with small nodule along right chest wall lateral to the pectoralis muscle concerning for malignant lesion.  Caprini Score: 7; Risk Factors include: Age, BMI greater than 25, history of breast cancer, and length of planned surgery. Recommendation for mechanical and possibly pharmacological prophylaxis. Will discuss with surgeon.  Encourage early ambulation.   Pictures obtained: 10/02/2022  Post-op Rx sent to pharmacy: Oxycodone and Zofran.    Patient was provided with the General Surgical Risk consent document and Pain Medication Agreement prior to their appointment.  They had adequate time to read through the risk consent documents and Pain Medication Agreement. We also discussed them in person together during this preop appointment. All of their  questions were answered to their satisfaction.  Recommended calling if they have any further questions.  Risk consent form and Pain Medication Agreement to be scanned into patient's chart.    Electronically signed by: Krista Blue, PA-C 11/21/2022 10:05 AM

## 2022-11-22 ENCOUNTER — Encounter: Payer: Self-pay | Admitting: *Deleted

## 2022-11-22 ENCOUNTER — Telehealth: Payer: Self-pay | Admitting: *Deleted

## 2022-11-22 ENCOUNTER — Encounter: Payer: Self-pay | Admitting: Physician Assistant

## 2022-11-22 ENCOUNTER — Ambulatory Visit: Payer: 59 | Admitting: Physician Assistant

## 2022-11-22 ENCOUNTER — Ambulatory Visit: Payer: 59 | Attending: Internal Medicine | Admitting: Physician Assistant

## 2022-11-22 VITALS — BP 116/74 | HR 72 | Ht 67.5 in | Wt 176.8 lb

## 2022-11-22 DIAGNOSIS — I428 Other cardiomyopathies: Secondary | ICD-10-CM | POA: Diagnosis not present

## 2022-11-22 DIAGNOSIS — I5022 Chronic systolic (congestive) heart failure: Secondary | ICD-10-CM | POA: Diagnosis not present

## 2022-11-22 DIAGNOSIS — Z95810 Presence of automatic (implantable) cardiac defibrillator: Secondary | ICD-10-CM

## 2022-11-22 LAB — CUP PACEART INCLINIC DEVICE CHECK
Battery Remaining Longevity: 43 mo
Brady Statistic RA Percent Paced: 4.3 %
Brady Statistic RV Percent Paced: 99.81 %
Date Time Interrogation Session: 20240201165246
HighPow Impedance: 57.375
Implantable Lead Connection Status: 753985
Implantable Lead Connection Status: 753985
Implantable Lead Connection Status: 753985
Implantable Lead Implant Date: 20231103
Implantable Lead Implant Date: 20231103
Implantable Lead Implant Date: 20231103
Implantable Lead Location: 753858
Implantable Lead Location: 753859
Implantable Lead Location: 753860
Implantable Lead Model: 3830
Implantable Pulse Generator Implant Date: 20231103
Lead Channel Impedance Value: 262.5 Ohm
Lead Channel Impedance Value: 537.5 Ohm
Lead Channel Impedance Value: 537.5 Ohm
Lead Channel Pacing Threshold Amplitude: 0.5 V
Lead Channel Pacing Threshold Amplitude: 0.5 V
Lead Channel Pacing Threshold Amplitude: 0.5 V
Lead Channel Pacing Threshold Amplitude: 0.5 V
Lead Channel Pacing Threshold Amplitude: 0.75 V
Lead Channel Pacing Threshold Amplitude: 0.75 V
Lead Channel Pacing Threshold Pulse Width: 0.5 ms
Lead Channel Pacing Threshold Pulse Width: 0.5 ms
Lead Channel Pacing Threshold Pulse Width: 0.5 ms
Lead Channel Pacing Threshold Pulse Width: 0.5 ms
Lead Channel Pacing Threshold Pulse Width: 0.5 ms
Lead Channel Pacing Threshold Pulse Width: 0.5 ms
Lead Channel Sensing Intrinsic Amplitude: 11.7 mV
Lead Channel Sensing Intrinsic Amplitude: 3.7 mV
Lead Channel Setting Pacing Amplitude: 3.5 V
Lead Channel Setting Pacing Amplitude: 3.5 V
Lead Channel Setting Pacing Amplitude: 3.5 V
Lead Channel Setting Pacing Pulse Width: 0.5 ms
Lead Channel Setting Pacing Pulse Width: 0.5 ms
Lead Channel Setting Sensing Sensitivity: 0.5 mV
Pulse Gen Serial Number: 5548670
Zone Setting Status: 755011

## 2022-11-22 NOTE — Telephone Encounter (Signed)
-----  Message from [facility], [place] sent at 11/22/2022  1:40 PM EST ----- Please lt the patient know that I reached out to Dr. Lovena Le (our dr. Lovena Le, not her plastic surgeon).  Dr. Lovena Le was very confident that her surgeon would do a great job and knows to avoid her device/device pocket, he did not think we needed to be involved.  Please also let her know that I sent my note and a message to dr. Terri Skains and they should would follow up with with their concerns regarding her potential/planned chemotherapy.

## 2022-11-22 NOTE — Patient Instructions (Signed)
Medication Instructions:   Your physician recommends that you continue on your current medications as directed. Please refer to the Current Medication list given to you today.   *If you need a refill on your cardiac medications before your next appointment, please call your pharmacy*   Lab Work: [place]    If you have labs (blood work) drawn today and your tests are completely normal, you will receive your results only by: [place] (if you have MyChart) OR A paper copy in the mail If you have any lab test that is abnormal or we need to change your treatment, we will call you to review the results.   Testing/Procedures: NONE ORDERED  TODAY    Follow-Up: At [facility], you and your health needs are our priority.  As part of our continuing mission to provide you with exceptional heart care, we have created designated Provider Care Teams.  These Care Teams include your primary Cardiologist (physician) and Advanced Practice Providers (APPs -  Physician Assistants and Nurse Practitioners) who all work together to provide you with the care you need, when you need it.  We recommend signing up for the patient portal called "MyChart".  Sign up information is provided on this After Visit Summary.  MyChart is used to connect with patients for Virtual Visits (Telemedicine).  Patients are able to view lab/test results, encounter notes, upcoming appointments, etc.  Non-urgent messages can be sent to your provider as well.   To learn more about what you can do with MyChart, go to [email or web address].    Your next appointment:   1 month(s) ( CONTACT [facility] FOR [facility] [place] )   Provider:   You may see Dr.Taylor    Other Instructions

## 2022-11-22 NOTE — Telephone Encounter (Signed)
Lvm for patient on updates Per Dr Lovena Le recommendations. Also left direct number to me POD J   for any other questions.

## 2022-11-23 ENCOUNTER — Encounter: Payer: Medicare Other | Admitting: Internal Medicine

## 2022-11-23 ENCOUNTER — Encounter: Payer: Self-pay | Admitting: Internal Medicine

## 2022-11-23 NOTE — Progress Notes (Signed)
PERIOPERATIVE PRESCRIPTION FOR IMPLANTED CARDIAC DEVICE PROGRAMMING  Patient Information: Name:  Jordan Willis  DOB:  1951-05-12  MRN:  [id number]    Planned Procedure:  Excision of Right Chest Wall Malignant Mass, Revision of Right Mastectomy Scar, Sentinel Node Biopsy, Possible Port Placement.   Surgeon:  Dr. Stark Klein   Date of Procedure:  12/04/22   Cautery will be used.   Position during surgery:  Supine   Please send documentation back to:  [facility] (Fax # [phone])   Device Information:  Clinic EP Physician:  Cristopher Peru, MD   Device Type:  Pacemaker and Defibrillator Manufacturer and Phone #:  [employer]: [phone] Pacemaker Dependent?:  No. Date of Last Device Check:  11/22/22 Normal Device Function?:  Yes.    Electrophysiologist's Recommendations:  Have magnet available. Provide continuous ECG monitoring when magnet is used or reprogramming is to be performed.  Procedure may interfere with device function.  Magnet should be placed over device during procedure.  Per Device Clinic Standing Orders, Wanda Plump, RN  11:41 AM 11/23/2022

## 2022-11-23 NOTE — Pre-Procedure Instructions (Addendum)
Surgical Instructions    Your procedure is scheduled on Tuesday 12/04/22.   Report to [facility] Main Entrance "A" at 08:30 A.M., then check in with the Admitting office.  Call this number if you have problems the morning of surgery:  [phone]   If you have any questions prior to your surgery date call [phone]: Open Monday-Friday 8am-4pm If you experience any cold or flu symptoms such as cough, fever, chills, shortness of breath, etc. between now and your scheduled surgery, please notify us at the above number     Remember:  Do not eat after midnight the night before your surgery  You may drink clear liquids until 07:30 A.M. the morning of your surgery.   Clear liquids allowed are: Water, Non-Citrus Juices (without pulp), Carbonated Beverages, Clear Tea, Black Coffee ONLY (NO MILK, CREAM OR POWDERED CREAMER of any kind), and Gatorade    Take these medicines the morning of surgery with A SIP OF WATER:  baclofen (LIORESAL)   cetirizine (ZYRTEC)   doxycycline (VIBRA-TABS)   fluticasone (FLONASE)   metoprolol succinate (TOPROL-XL)  sertraline (ZOLOFT)   exemestane (AROMASIN)     Take these medicines if needed:   albuterol (VENTOLIN HFA) 108 (90 Base)   nitroGLYCERIN (NITROSTAT)   Olopatadine   ondansetron (ZOFRAN-ODT)   oxyCODONE (OXY IR/ROXICODONE)    As of today, STOP taking any Aspirin (unless otherwise instructed by your surgeon) Aleve, Naproxen, Ibuprofen, Motrin, Advil, Goody's, BC's, all herbal medications, fish oil, diclofenac Sodium (VOLTAREN), and all vitamins.  WHAT DO I DO ABOUT MY DIABETES MEDICATION?   Do not take oral diabetes medicines (pills) the morning of surgery.  DO NOT TAKE FARXIGA three days prior to surgery. The last dose should be taken on 11/30/22.  DO NOT TAKE metFORMIN (GLUCOPHAGE-XR) the morning of surgery.   The day of surgery, do not take other diabetes injectables, including Byetta (exenatide), Bydureon (exenatide ER), Victoza  (liraglutide), or Trulicity (dulaglutide).  HOW TO MANAGE YOUR DIABETES BEFORE AND AFTER SURGERY  Why is it important to control my blood sugar before and after surgery? Improving blood sugar levels before and after surgery helps healing and can limit problems. A way of improving blood sugar control is eating a healthy diet by:  Eating less sugar and carbohydrates  Increasing activity/exercise  Talking with your doctor about reaching your blood sugar goals High blood sugars (greater than 180 mg/dL) can raise your risk of infections and slow your recovery, so you will need to focus on controlling your diabetes during the weeks before surgery. Make sure that the doctor who takes care of your diabetes knows about your planned surgery including the date and location.  How do I manage my blood sugar before surgery? Check your blood sugar at least 4 times a day, starting 2 days before surgery, to make sure that the level is not too high or low.  Check your blood sugar the morning of your surgery when you wake up and every 2 hours until you get to the Short Stay unit.  If your blood sugar is less than 70 mg/dL, you will need to treat for low blood sugar: Do not take insulin. Treat a low blood sugar (less than 70 mg/dL) with  cup of clear juice (cranberry or apple), 4 glucose tablets, OR glucose gel. Recheck blood sugar in 15 minutes after treatment (to make sure it is greater than 70 mg/dL). If your blood sugar is not greater than 70 mg/dL on recheck, call [phone] for  further instructions. Report your blood sugar to the short stay nurse when you get to Short Stay.  If you are admitted to the hospital after surgery: Your blood sugar will be checked by the staff and you will probably be given insulin after surgery (instead of oral diabetes medicines) to make sure you have good blood sugar levels. The goal for blood sugar control after surgery is 80-180 mg/dL.           Do not wear jewelry  or makeup. Do not wear lotions, powders, perfumes/cologne or deodorant. Do not shave 48 hours prior to surgery.  Men may shave face and neck. Do not bring valuables to the hospital. Do not wear nail polish, gel polish, artificial nails, or any other type of covering on natural nails (fingers and toes) If you have artificial nails or gel coating that need to be removed by a nail salon, please have this removed prior to surgery. Artificial nails or gel coating may interfere with anesthesia's ability to adequately monitor your vital signs.  [facility] is not responsible for any belongings or valuables.    Do NOT Smoke (Tobacco/Vaping)  24 hours prior to your procedure  If you use a CPAP at night, you may bring your mask for your overnight stay.   Contacts, glasses, hearing aids, dentures or partials may not be worn into surgery, please bring cases for these belongings   For patients admitted to the hospital, discharge time will be determined by your treatment team.   Patients discharged the day of surgery will not be allowed to drive home, and someone needs to stay with them for 24 hours.   SURGICAL WAITING ROOM VISITATION Patients having surgery or a procedure may have no more than 2 support people in the waiting area - these visitors may rotate.   Children under the age of 65 must have an adult with them who is not the patient. If the patient needs to stay at the hospital during part of their recovery, the visitor guidelines for inpatient rooms apply. Pre-op nurse will coordinate an appropriate time for 1 support person to accompany patient in pre-op.  This support person may not rotate.   Please refer to [email or web address] for the visitor guidelines for Inpatients (after your surgery is over and you are in a regular room).    Special instructions:    Oral Hygiene is also important to reduce your risk of infection.  Remember - BRUSH  YOUR TEETH THE MORNING OF SURGERY WITH YOUR REGULAR TOOTHPASTE   [facility]- Preparing For Surgery  Before surgery, you can play an important role. Because skin is not sterile, your skin needs to be as free of germs as possible. You can reduce the number of germs on your skin by washing with CHG (chlorahexidine gluconate) Soap before surgery.  CHG is an antiseptic cleaner which kills germs and bonds with the skin to continue killing germs even after washing.     Please do not use if you have an allergy to CHG or antibacterial soaps. If your skin becomes reddened/irritated stop using the CHG.  Do not shave (including legs and underarms) for at least 48 hours prior to first CHG shower. It is OK to shave your face.  Please follow these instructions carefully.     Shower the NIGHT BEFORE SURGERY and the MORNING OF SURGERY with CHG Soap.   If you chose to wash your hair, wash your hair first as usual with your normal shampoo. After  you shampoo, rinse your hair and body thoroughly to remove the shampoo.  Then [employer] and genitals (private parts) with your normal soap and rinse thoroughly to remove soap.  After that Use CHG Soap as you would any other liquid soap. You can apply CHG directly to the skin and wash gently with a scrungie or a clean washcloth.   Apply the CHG Soap to your body ONLY FROM THE NECK DOWN.  Do not use on open wounds or open sores. Avoid contact with your eyes, ears, mouth and genitals (private parts). Wash Face and genitals (private parts)  with your normal soap.   Wash thoroughly, paying special attention to the area where your surgery will be performed.  Thoroughly rinse your body with warm water from the neck down.  DO NOT shower/wash with your normal soap after using and rinsing off the CHG Soap.  Pat yourself dry with a CLEAN TOWEL.  Wear CLEAN PAJAMAS to bed the night before surgery  Place CLEAN SHEETS on your bed the night before your surgery  DO NOT SLEEP  WITH PETS.   Day of Surgery:  Take a shower with CHG soap. Wear Clean/Comfortable clothing the morning of surgery Do not apply any deodorants/lotions.   Remember to brush your teeth WITH YOUR REGULAR TOOTHPASTE.    If you received a COVID test during your pre-op visit, it is requested that you wear a mask when out in public, stay away from anyone that may not be feeling well, and notify your surgeon if you develop symptoms. If you have been in contact with anyone that has tested positive in the last 10 days, please notify your surgeon.    Please read over the following fact sheets that you were given.

## 2022-11-23 NOTE — Progress Notes (Signed)
Spoke to representative [employer] with [place] to notify him of patient's upcoming surgery on 12/04/22. Aaron Edelman was made aware of date, time, and location. Aaron Edelman stated that a representative will be present on the day of surgery to consult with anesthesia.

## 2022-11-23 NOTE — Progress Notes (Signed)
Device orders request sent via [employer].

## 2022-11-26 ENCOUNTER — Encounter (HOSPITAL_COMMUNITY)
Admission: RE | Admit: 2022-11-26 | Discharge: 2022-11-26 | Disposition: A | Discharge status: Home / Self Care | Payer: 59 | Source: Ambulatory Visit | Attending: Gastroenterology | Admitting: Gastroenterology

## 2022-11-26 ENCOUNTER — Encounter (HOSPITAL_COMMUNITY): Payer: Self-pay

## 2022-11-26 ENCOUNTER — Other Ambulatory Visit: Payer: Self-pay

## 2022-11-26 VITALS — BP 124/68 | HR 65 | Temp 97.9°F | Resp 17 | Ht 67.5 in | Wt 178.0 lb

## 2022-11-26 DIAGNOSIS — Z01812 Encounter for preprocedural laboratory examination: Secondary | ICD-10-CM | POA: Insufficient documentation

## 2022-11-26 DIAGNOSIS — Z01818 Encounter for other preprocedural examination: Secondary | ICD-10-CM

## 2022-11-26 DIAGNOSIS — E1142 Type 2 diabetes mellitus with diabetic polyneuropathy: Secondary | ICD-10-CM | POA: Diagnosis not present

## 2022-11-26 HISTORY — DX: Presence of automatic (implantable) cardiac defibrillator: Z95.810

## 2022-11-26 LAB — BASIC METABOLIC PANEL
Anion gap: 12 (ref 5–15)
BUN: 8 mg/dL (ref 8–23)
CO2: 23 mmol/L (ref 22–32)
Calcium: 9.7 mg/dL (ref 8.9–10.3)
Chloride: 102 mmol/L (ref 98–111)
Creatinine, Ser: 0.72 mg/dL (ref 0.44–1.00)
GFR, Estimated: >60 mL/min (ref >60)
Glucose, Bld: 107 mg/dL — ABNORMAL HIGH (ref 70–99)
Potassium: 3.7 mmol/L (ref 3.5–5.1)
Sodium: 137 mmol/L (ref 135–145)

## 2022-11-26 LAB — GLUCOSE, CAPILLARY: Glucose-Capillary: 109 mg/dL — ABNORMAL HIGH (ref 70–99)

## 2022-11-26 LAB — CBC
HCT: 39.4 % (ref 36.0–46.0)
Hemoglobin: 12.9 g/dL (ref 12.0–15.0)
MCH: 29.5 pg (ref 26.0–34.0)
MCHC: 32.7 g/dL (ref 30.0–36.0)
MCV: 90 fL (ref 80.0–100.0)
Platelets: 254 10*3/uL (ref 150–400)
RBC: 4.38 MIL/uL (ref 3.87–5.11)
RDW: 12.4 % (ref 11.5–15.5)
WBC: 4.7 10*3/uL (ref 4.0–10.5)
nRBC: 0 % (ref 0.0–0.2)

## 2022-11-26 LAB — HEMOGLOBIN A1C
Hgb A1c MFr Bld: 6.1 % — ABNORMAL HIGH (ref 4.8–5.6)
Mean Plasma Glucose: 128.37 mg/dL

## 2022-11-26 NOTE — Progress Notes (Signed)
PCP - walter pharr EP Cardiologist - gregg taylor Cardiologist: Terri Skains Oncologist: Nicholas Lose  PPM/ICD - st jude  Device Orders - in chart and EPIC Rep Notified - called and notified on surgery date  Chest x-ray - n/a EKG - 08/25/22 Stress Test - 02/01/20 ECHO - 10/29/22 Cardiac Cath - 03/27/22  Sleep Study - denies   Fasting Blood Sugar - 100-120 Checks Blood Sugar once a day   As of today, STOP taking any Aspirin (unless otherwise instructed by your surgeon) Aleve, Naproxen, Ibuprofen, Motrin, Advil, Goody's, BC's, all herbal medications, fish oil, diclofenac Sodium (VOLTAREN), and all vitamins.   ERAS Protcol -yes   COVID TEST- not needed   Anesthesia review: yes, cardiac history  Patient denies shortness of breath, fever, cough and chest pain at PAT appointment   All instructions explained to the patient, with a verbal understanding of the material. Patient agrees to go over the instructions while at home for a better understanding. Patient also instructed to self quarantine after being tested for COVID-19. The opportunity to ask questions was provided.

## 2022-11-27 ENCOUNTER — Ambulatory Visit: Payer: 59

## 2022-11-27 ENCOUNTER — Encounter: Payer: Medicare Other | Admitting: Internal Medicine

## 2022-11-27 DIAGNOSIS — I428 Other cardiomyopathies: Secondary | ICD-10-CM

## 2022-11-27 LAB — CUP PACEART REMOTE DEVICE CHECK
Battery Remaining Longevity: 41 mo
Battery Remaining Percentage: 90 %
Battery Voltage: 3.07 V
Brady Statistic AP VP Percent: 11 %
Brady Statistic AP VS Percent: 1 %
Brady Statistic AS VP Percent: 89 %
Brady Statistic AS VS Percent: 1 %
Brady Statistic RA Percent Paced: 11 %
Date Time Interrogation Session: 20240206020020
HighPow Impedance: 56 Ohm
HighPow Impedance: 56 Ohm
Implantable Lead Connection Status: 753985
Implantable Lead Connection Status: 753985
Implantable Lead Connection Status: 753985
Implantable Lead Implant Date: 20231103
Implantable Lead Implant Date: 20231103
Implantable Lead Implant Date: 20231103
Implantable Lead Location: 753858
Implantable Lead Location: 753859
Implantable Lead Location: 753860
Implantable Lead Model: 3830
Implantable Pulse Generator Implant Date: 20231103
Lead Channel Impedance Value: 260 Ohm
Lead Channel Impedance Value: 450 Ohm
Lead Channel Impedance Value: 450 Ohm
Lead Channel Pacing Threshold Amplitude: 0.5 V
Lead Channel Pacing Threshold Amplitude: 0.5 V
Lead Channel Pacing Threshold Amplitude: 0.75 V
Lead Channel Pacing Threshold Pulse Width: 0.5 ms
Lead Channel Pacing Threshold Pulse Width: 0.5 ms
Lead Channel Pacing Threshold Pulse Width: 0.5 ms
Lead Channel Sensing Intrinsic Amplitude: 11.7 mV
Lead Channel Sensing Intrinsic Amplitude: 3.1 mV
Lead Channel Setting Pacing Amplitude: 3.5 V
Lead Channel Setting Pacing Amplitude: 3.5 V
Lead Channel Setting Pacing Amplitude: 3.5 V
Lead Channel Setting Pacing Pulse Width: 0.5 ms
Lead Channel Setting Pacing Pulse Width: 0.5 ms
Lead Channel Setting Sensing Sensitivity: 0.5 mV
Pulse Gen Serial Number: 5548670
Zone Setting Status: 755011

## 2022-11-27 NOTE — Progress Notes (Signed)
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  EF 30 to 35% by echo 10/2022.  Last seen in the EP clinic by Tommye Standard, PA-C 11/22/2022.  Upcoming surgery was discussed.  Per note, "I will send a message to Drs Terri Skains and Dr. Lovena Le regarding her upcoming surgery/proximity to her device, planned port and nay device management savageries we may/may not need to pursue. There is NO plans for radiation. I discussed with them that decision on chemo would be left to her oncologist and Dr. Terri Skains given her CM. ADDEND: Dr. Lovena Le felt confident that the surgeon would avoid device pocket, and surgical decision/management to their team, not felt to need our input. I will ask my MA to make the patient aware. I have sent my note and message to Dr. Terri Skains as I said I would to make sure he is aware of upcoming surgery and likely chemo."  Follows with oncology for history of breast cancer s/p bilateral mastectomies and radiation.  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  Non-insulin-dependent DM2, well-controlled, A1c 6.1 on preop labs.  Preop labs reviewed, unremarkable.  EKG 08/25/2022: Atrial sensed ventricular paced rhythm.  Rate 73.  Perioperative prescription for implanted cardiac device programming per note 12/03/2022: Device Information:   Clinic EP Physician:  Cristopher Peru, MD    Device Type:  Pacemaker and Defibrillator Manufacturer and Phone #:  St[employer] Jude/Abbott: [phone] Pacemaker Dependent?:  No. Date of Last Device Check:  11/22/22 Normal Device Function?:  Yes.     Electrophysiologist's Recommendations:   Have magnet available. Provide continuous ECG monitoring when magnet is used or reprogramming is to be performed.  Procedure may interfere with device function.   Magnet should be placed over device during procedure.  TTE 10/29/2022:  1. Left ventricular ejection fraction, by estimation, is 30 to 35%. The  left ventricle has moderately decreased function. The left ventricle  demonstrates regional wall motion abnormalities. Abnormal (paradoxical)  septal motion, consistent with left  bundle branch block      There is mild asymmetric left ventricular hypertrophy of the  basal-septal segment. Left ventricular diastolic parameters are consistent  with Grade I diastolic dysfunction (impaired relaxation).   2. Right ventricular systolic function is low normal. The right  ventricular size is normal. There is normal pulmonary artery systolic  pressure. The estimated right ventricular systolic pressure is 55.9 mmHg.   3. The mitral valve is normal in structure. Trivial mitral valve  regurgitation. No evidence of mitral stenosis.   4. Pulmonic valve regurgitation is moderate.   5. The aortic valve is tricuspid. There is mild calcification of the  aortic valve. Aortic valve regurgitation is not visualized. Aortic valve  sclerosis/calcification is present, without any evidence of aortic  stenosis.   6. The inferior vena cava is normal in size with greater than 50%  respiratory variability, suggesting right atrial pressure of 3 mmHg.   Left Heart Catheterization 03/27/22:  LV: 118/3, EDP 11 mmHg.  Ao 128/56, mean 84 mmHg.  No pressure gradient across the aortic valve. LVEF 30 to 35% with global hypokinesis.  LV is mildly dilated. LM: Large caliber vessels, smooth and normal. LAD: Large vessel, gives origin to several small diagonals.  Smooth and normal.  Mid to distal segment is tortuous and may suggest hypertensive heart disease. RI: Large vessel, gives  origin to large lateral branches.  Smooth and normal. CX: Large vessel, gives origin to moderate-sized OM1, 2 and continues as OM 3.  Smooth and normal. RCA: Dominant, continues as a PDA.  Smooth and  normal.  Impression: Findings consistent with nonischemic dilated cardiomyopathy.  Severe LV systolic dysfunction.    Wynonia Musty [facility]/Anesthesiology Phone [phone] 11/27/2022 3:28 PM

## 2022-11-27 NOTE — Anesthesia Preprocedure Evaluation (Addendum)
Anesthesia Evaluation  Patient identified by MRN, date of birth, ID band Patient awake    Reviewed: Allergy & Precautions, NPO status , Patient's Chart, lab work & pertinent test results  History of Anesthesia Complications (+) PONV and history of anesthetic complications  Airway Mallampati: III  TM Distance: >3 FB Neck ROM: Full    Dental  (+) Teeth Intact, Dental Advisory Given   Pulmonary sleep apnea    Pulmonary exam normal breath sounds clear to auscultation       Cardiovascular hypertension, Pt. on home beta blockers and Pt. on medications + angina  +CHF  Normal cardiovascular exam+ Cardiac Defibrillator  Rhythm:Regular Rate:Normal  Follows with cardiology for history of left bundle branch block and nonischemic cardiomyopathy s/p Abbott CRT-D PPM implant 08/24/2022  TTE 10/29/2022:  1. Left ventricular ejection fraction, by estimation, is 30 to 35%. The  left ventricle has moderately decreased function. The left ventricle  demonstrates regional wall motion abnormalities. Abnormal (paradoxical)  septal motion, consistent with left  bundle branch block      There is mild asymmetric left ventricular hypertrophy of the  basal-septal segment. Left ventricular diastolic parameters are consistent  with Grade I diastolic dysfunction (impaired relaxation).   2. Right ventricular systolic function is low normal. The right  ventricular size is normal. There is normal pulmonary artery systolic  pressure. The estimated right ventricular systolic pressure is [id number] mmHg.   3. The mitral valve is normal in structure. Trivial mitral valve  regurgitation. No evidence of mitral stenosis.   4. Pulmonic valve regurgitation is moderate.   5. The aortic valve is tricuspid. There is mild calcification of the  aortic valve. Aortic valve regurgitation is not visualized. Aortic valve  sclerosis/calcification is present, without any evidence of  aortic  stenosis.   6. The inferior vena cava is normal in size with greater than 50%  respiratory variability, suggesting right atrial pressure of 3 mmHg.    Left Heart Catheterization 03/27/22:  LV: 118/3, EDP 11 mmHg.  Ao 128/56, mean 84 mmHg.  No pressure gradient across the aortic valve. LVEF 30 to 35% with global hypokinesis.  LV is mildly dilated. LM: Large caliber vessels, smooth and normal. LAD: Large vessel, gives origin to several small diagonals.  Smooth and normal.  Mid to distal segment is tortuous and may suggest hypertensive heart disease. RI: Large vessel, gives origin to large lateral branches.  Smooth and normal. CX: Large vessel, gives origin to moderate-sized OM1, 2 and continues as OM 3.  Smooth and normal. RCA: Dominant, continues as a PDA.  Smooth and normal.  Impression: Findings consistent with nonischemic dilated cardiomyopathy.  Severe LV systolic dysfunction.    Neuro/Psych  Headaches PSYCHIATRIC DISORDERS Anxiety        GI/Hepatic Neg liver ROS,GERD  ,,  Endo/Other  diabetes, Type 2, Oral Hypoglycemic Agents    Renal/GU negative Renal ROS  negative genitourinary   Musculoskeletal  (+) Arthritis ,    Abdominal   Peds  Hematology negative hematology ROS (+)   Anesthesia Other Findings   Reproductive/Obstetrics                             Anesthesia Physical Anesthesia Plan  ASA: 3  Anesthesia Plan: General   Post-op Pain Management: Tylenol PO (pre-op)*   Induction: Intravenous  PONV Risk Score and Plan: 4 or greater and Dexamethasone, Ondansetron and Treatment may vary due to age or medical condition  Airway Management Planned: Oral ETT  Additional Equipment:   Intra-op Plan:   Post-operative Plan: Extubation in OR  Informed Consent: I have reviewed the patients History and Physical, chart, labs and discussed the procedure including the risks, benefits and alternatives for the proposed anesthesia with  the patient or authorized representative who has indicated his/her understanding and acceptance.     Dental advisory given  Plan Discussed with: CRNA  Anesthesia Plan Comments:         Anesthesia Quick Evaluation

## 2022-11-29 NOTE — Progress Notes (Signed)
[name], counseling intern, met with patient for their scheduled counseling session.   The patient entered the session sharing they were feeling stressed. The patient shared their sister is in from out of town and they have a lot on their to do list.  The patient shared hardships in childhood and reflected on how it might be affecting the way she cares for people. The patient shared in this session, and has shared in the past, she "goes overboard" when caring for people. The patient reported she is trying to take care of herself more and has found that when she takes small rests, she gets more done and feels better.   The patient has surgery on Feb. 13th. The counselor said she would call the patient post surgery to set up their next appointment.   [name],  Counseling Intern  [phone] [email or web address]

## 2022-11-30 ENCOUNTER — Ambulatory Visit
Admission: RE | Admit: 2022-11-30 | Discharge: 2022-11-30 | Disposition: A | Discharge status: Home / Self Care | Payer: 59 | Source: Ambulatory Visit | Attending: Endocrinology | Admitting: Endocrinology

## 2022-11-30 DIAGNOSIS — E042 Nontoxic multinodular goiter: Secondary | ICD-10-CM

## 2022-12-03 NOTE — H&P (Signed)
Patient Care Team: Alden Server, MD as PCP - General (Internal Medicine)  MRN: [id number] DOB: 1951-03-13   Chief Complaint: Discuss breast surgery   History of Present Illness: Jordan Willis is a 72 y.o. female who is seen today for breast cancer.  Initial history:  Pt was diagnosed with right breast cancer dx 05/2021. She has BRCA2 genetic mutation and has had left breast cancer in 2006 with chemo/breast conservation and XRT. She then had right breast cancer (dcis) with lumpectomy and radiation only. Dr. Bubba Camp was her surgeon. She presented this time with a palpable painful mass in the upper outer right breast. Dx imaging showed a 1.2 cm mass at 10 o'clock. 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 The cardiomyopathy recovered, but the neuropathy did not. Her sister and mother have both had breast cancer.   Pt underwent bilateral mastectomies with right sentinel node biopsy and expander reconstruction (Pace) 09/04/21. Final path at that time was [id number] with 6 negative nodes and negative margins.  Interval history:  Patient ended up having the expanders removed because of continued drainage and discomfort. She has had a chronic nonhealing region that we will open up and drain on the left mastectomy scar. Plastic surgery has been seeing her for that.  Unfortunately, patient developed pain on the right lateral mastectomy scar. She was found to have a nodule there and diagnostic imaging was performed. Ultrasound showed a 1.3 cm irregular mass at 9:00. Core needle biopsy was performed and this demonstrated invasive ductal carcinoma, grade 3. This was weakly ER positive, PR negative, HER2 positive, Ki-67 70%.   Review of Systems: A complete review of systems was obtained from the patient. I have reviewed this information and discussed as appropriate with the patient. See HPI as well for other  ROS.  Review of Systems All other systems reviewed and are negative.   Medical History: Past Medical History: Diagnosis Date Arthritis Diabetes mellitus without complication (CMS-HCC) History of cancer  Patient Active Problem List Diagnosis Malignant neoplasm of upper-outer quadrant of right breast in female, estrogen receptor positive History of right breast cancer History of left breast cancer BRCA2 positive Abnormal stress test Abnormal stress test Alkaline phosphatase raised Allergic rhinitis Angina pectoris (CMS-HCC) Body mass index (BMI) 30.0-30.9, adult Cerebral atherosclerosis Chronic pain Complete rotator cuff tear Tear of right rotator cuff Diabetic neuropathy (CMS-HCC) Ductal carcinoma in situ (DCIS) of right breast Dysphagia Annual physical exam Colon cancer screening Encounter for general adult medical examination with abnormal findings Encounter for postoperative care Essential hypertension Family history of diabetes mellitus Fecal urgency Female genital prolapse, unspecified Gastroesophageal reflux disease Generalized anxiety disorder Panic disorder History of chemotherapy History of colonic polyps History of nephrolithiasis History of radiation therapy Hypercholesterolemia Hyperglycemia due to type 2 diabetes mellitus (CMS-HCC) Hypertensive heart failure (CMS-HCC) Left heart failure (CMS-HCC) Hyporeflexia Inflammatory arthritis Osteoarthritis Influenza vaccine needed Intractable chronic post-traumatic headache Left bundle branch block Mitral valve disorder Muscle contraction headache Non-toxic multinodular goiter Other cardiomyopathies (CMS-HCC) Pain in joint of right shoulder Menopause Postmenopausal state Stress incontinence (female) (female) Goiter Thyrotoxicosis, 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 Personal history of malignant neoplasm of breast Malignant neoplasm of upper-outer quadrant of left breast in female,  estrogen receptor negative Recurrent breast cancer, right (CMS-HCC)  Past Surgical History: Procedure Laterality Date fibroid tumor N/A HYSTERECTOMY MASTECTOMY PARTIAL / LUMPECTOMY N/A   Allergies Allergen Reactions Aluminum Aspirin Palpitations Aspirin Palpitations  and Other (See Comments) Heart fluttering Heart fluttering  Adhesive Rash Adhesive Tape-Silicones Rash  Current Outpatient Medications on File Prior to Visit Medication Sig Dispense Refill bumetanide (BUMEX) 1 MG tablet Take 1 tablet by mouth once daily exemestane (AROMASIN) 25 mg tablet Take by mouth FARXIGA 10 mg tablet Take 1 tablet by mouth every morning before breakfast (0630) oxyCODONE (ROXICODONE) 5 MG immediate release tablet Take by mouth rosuvastatin (CRESTOR) 20 MG tablet Take 20 mg by mouth once daily spironolactone (ALDACTONE) 25 MG tablet Take 1 tablet by mouth every morning amoxicillin-clavulanate (AUGMENTIN) 875-125 mg tablet amoxicillin 875 mg-potassium clavulanate 125 mg tablet (Patient not taking: Reported on 11/02/2022) ascorbic acid, vitamin C, (VITAMIN C) 500 MG tablet Take 1,000 mg by mouth aspirin 81 MG chewable tablet 1 tablet (Patient not taking: Reported on 11/02/2022) atorvastatin (LIPITOR) 20 MG tablet Take 1 tablet by mouth once daily blood glucose diagnostic (ONETOUCH VERIO TEST STRIPS) test strip TEST ONCE DAILY AS DIRECTED cholecalciferol (VITAMIN D3) 2,000 unit tablet 2000 units by oral route. chromium picolinate 1,000 mcg Tab chromium picolinate 1,000 mcg tablet 1000 ugs by oral route. codeine-guaifenesin 10-100 mg/5 mL oral liquid codeine 10 mg-guaifenesin 100 mg/5 mL oral liquid TAKE 10 MLS BY MOUTH 4 TIMES A DAY AS NEEDED FOR COUGH cyanocobalamin, vitamin B-12, 5,000 mcg Cap Take by mouth fluticasone propionate (FLONASE) 50 mcg/actuation nasal spray Place 2 sprays into both nostrils once daily gabapentin (NEURONTIN) 100 MG capsule Take 1 capsule (100 mg total) by mouth 3 (three)  times daily as needed 60 capsule 1 HYDROcodone-acetaminophen (NORCO) 5-325 mg tablet hydrocodone 5 mg-acetaminophen 325 mg tablet TAKE 1 TABLET BY MOUTH THREE TIMES A DAY AS NEEDED FOR 7 DAYS (Patient not taking: Reported on 11/02/2022) ibuprofen (MOTRIN) 600 MG tablet ibuprofen 600 mg tablet lancets (ONETOUCH DELICA LANCETS) TEST ONCE DAILY AS DIRECTED metFORMIN (GLUCOPHAGE) 500 MG tablet Take by mouth methocarbamoL (ROBAXIN) 750 MG tablet TAKE 1 TABLET BY MOUTH EVERY DAY AT BEDTIME AS NEEDED EAR PAIN metoprolol succinate (TOPROL-XL) 25 MG XL tablet Take by mouth nitroGLYcerin (NITROSTAT) 0.4 MG SL tablet Place under the tongue predniSONE (DELTASONE) 20 MG tablet prednisone 20 mg tablet (Patient not taking: Reported on 11/02/2022) sacubitriL-valsartan (ENTRESTO) 24-26 mg tablet 1 sertraline (ZOLOFT) 50 MG tablet TAKE 1/2 TABLET FOR 4 DAYS, THEN TAKE 1 TABLET EACH MORNING  No current facility-administered medications on file prior to visit.  Family History Problem Relation Age of Onset High blood pressure (Hypertension) Mother Hyperlipidemia (Elevated cholesterol) Mother Breast cancer Mother High blood pressure (Hypertension) Father Diabetes Father Hyperlipidemia (Elevated cholesterol) Sister Breast cancer Sister   Social History  Tobacco Use Smoking Status Never Smokeless Tobacco Never   Social History  Socioeconomic History Marital status: Divorced Tobacco Use Smoking status: Never Smokeless tobacco: Never Vaping Use Vaping Use: Never used Substance and Sexual Activity Alcohol use: Never Drug use: Never  Objective:  Vitals:  Height: 171.5 cm (5' 7.5") PainSc: 2  Body mass index is 30.62 kg/m.  Head: Normocephalic and atraumatic. Eyes: Conjunctivae are normal. Pupils are equal, round, and reactive to light. No scleral icterus. Neck: Normal range of motion. Neck supple. No tracheal deviation present. No thyromegaly present. Resp: No respiratory distress,  normal effort. Breast: Both breasts are surgically absent. She has a right-sided pacemaker. At the right superior lateral mastectomy scar, there is a 1 and half centimeter from nodule that is nonmobile. She has excess skin on both sides since she had the expanders removed. She has no lymphadenopathy palpable on either  side. There is no other chest wall nodules palpable. There are chronic changes from radiation bilaterally. Abd: Abdomen is soft, non distended and non tender. No masses are palpable. There is no rebound and no guarding. Neurological: Alert and oriented to person, place, and time. Coordination normal. Skin: Skin is warm and dry. No rash noted. No diaphoretic. No erythema. No pallor. Psychiatric: Normal mood and affect. Normal behavior. Judgment and thought content normal.  Labs, Imaging and Diagnostic Testing:  N/a  Assessment and Plan:  Diagnoses and all orders for this visit:  Recurrent breast cancer, right (CMS-HCC) - baclofen (LIORESAL) 10 MG tablet; Take 1 tablet (10 mg total) by mouth 3 (three) times daily as needed (muscle spasms) for up to 30 days  BRCA2 positive   Unfortunately, patient has recurrent right breast cancer. This is functionally hormone negative and HER2 positive. Patient has staging scans early next week. I do think that even if the staging scans are positive for metastatic disease, I would still recommend excision of the mass. She has quite a lot of pain from this and I think that she would benefit from a palliative removal of this. Also, she may still benefit from having excision of the excess skin to improve the chronic wound issue on the left.  I have contacted [name] to see about a port. I do think I could place a port on the left side. I would also restage her right axilla if she does not have metastatic disease. The dye may not map. If not, I would not plan an axillary lymph node dissection. She does not have any gross metastatic disease in her  axilla based on her ultrasound.  She is requiring narcotic pain medicine for the discomfort from her recurrent tumor. I am writing her for her muscle relaxant. She reports that she does not need any additional pain medication and that she is trying very hard not to take it. We also discussed alternating heat and ice.

## 2022-12-04 ENCOUNTER — Encounter (HOSPITAL_COMMUNITY): Payer: Self-pay | Admitting: General Surgery

## 2022-12-04 ENCOUNTER — Other Ambulatory Visit: Payer: Self-pay

## 2022-12-04 ENCOUNTER — Ambulatory Visit (HOSPITAL_COMMUNITY)
Admission: RE | Admit: 2022-12-04 | Discharge: 2022-12-04 | Disposition: A | Discharge status: Home / Self Care | Payer: 59 | Attending: General Surgery | Admitting: General Surgery

## 2022-12-04 ENCOUNTER — Encounter (HOSPITAL_COMMUNITY)
Admission: RE | Disposition: A | Discharge status: Home / Self Care | Payer: Self-pay | Source: Home / Self Care | Attending: General Surgery

## 2022-12-04 ENCOUNTER — Ambulatory Visit (HOSPITAL_COMMUNITY): Payer: 59 | Admitting: Physician Assistant

## 2022-12-04 ENCOUNTER — Ambulatory Visit (HOSPITAL_BASED_OUTPATIENT_CLINIC_OR_DEPARTMENT_OTHER): Payer: 59 | Admitting: Physician Assistant

## 2022-12-04 DIAGNOSIS — M9689 Other intraoperative and postprocedural complications and disorders of the musculoskeletal system: Secondary | ICD-10-CM

## 2022-12-04 DIAGNOSIS — G620 Drug-induced polyneuropathy: Secondary | ICD-10-CM | POA: Diagnosis not present

## 2022-12-04 DIAGNOSIS — T8149XA Infection following a procedure, other surgical site, initial encounter: Secondary | ICD-10-CM | POA: Diagnosis not present

## 2022-12-04 DIAGNOSIS — C50911 Malignant neoplasm of unspecified site of right female breast: Secondary | ICD-10-CM | POA: Diagnosis not present

## 2022-12-04 DIAGNOSIS — I427 Cardiomyopathy due to drug and external agent: Secondary | ICD-10-CM | POA: Diagnosis not present

## 2022-12-04 DIAGNOSIS — E119 Type 2 diabetes mellitus without complications: Secondary | ICD-10-CM | POA: Insufficient documentation

## 2022-12-04 DIAGNOSIS — I509 Heart failure, unspecified: Secondary | ICD-10-CM | POA: Insufficient documentation

## 2022-12-04 DIAGNOSIS — C50919 Malignant neoplasm of unspecified site of unspecified female breast: Secondary | ICD-10-CM

## 2022-12-04 DIAGNOSIS — K219 Gastro-esophageal reflux disease without esophagitis: Secondary | ICD-10-CM | POA: Insufficient documentation

## 2022-12-04 DIAGNOSIS — L905 Scar conditions and fibrosis of skin: Secondary | ICD-10-CM | POA: Insufficient documentation

## 2022-12-04 DIAGNOSIS — Z1501 Genetic susceptibility to malignant neoplasm of breast: Secondary | ICD-10-CM | POA: Diagnosis not present

## 2022-12-04 DIAGNOSIS — C44591 Other specified malignant neoplasm of skin of breast: Secondary | ICD-10-CM | POA: Diagnosis not present

## 2022-12-04 DIAGNOSIS — Z7984 Long term (current) use of oral hypoglycemic drugs: Secondary | ICD-10-CM | POA: Diagnosis not present

## 2022-12-04 DIAGNOSIS — Z170 Estrogen receptor positive status [ER+]: Secondary | ICD-10-CM | POA: Diagnosis not present

## 2022-12-04 DIAGNOSIS — G4730 Sleep apnea, unspecified: Secondary | ICD-10-CM

## 2022-12-04 DIAGNOSIS — Y831 Surgical operation with implant of artificial internal device as the cause of abnormal reaction of the patient, or of later complication, without mention of misadventure at the time of the procedure: Secondary | ICD-10-CM | POA: Diagnosis not present

## 2022-12-04 DIAGNOSIS — S21002D Unspecified open wound of left breast, subsequent encounter: Secondary | ICD-10-CM

## 2022-12-04 DIAGNOSIS — N641 Fat necrosis of breast: Secondary | ICD-10-CM | POA: Diagnosis not present

## 2022-12-04 DIAGNOSIS — E1142 Type 2 diabetes mellitus with diabetic polyneuropathy: Secondary | ICD-10-CM

## 2022-12-04 DIAGNOSIS — C641 Malignant neoplasm of right kidney, except renal pelvis: Secondary | ICD-10-CM | POA: Diagnosis not present

## 2022-12-04 DIAGNOSIS — L929 Granulomatous disorder of the skin and subcutaneous tissue, unspecified: Secondary | ICD-10-CM | POA: Diagnosis not present

## 2022-12-04 DIAGNOSIS — Z803 Family history of malignant neoplasm of breast: Secondary | ICD-10-CM | POA: Diagnosis not present

## 2022-12-04 DIAGNOSIS — I110 Hypertensive heart disease with heart failure: Secondary | ICD-10-CM | POA: Insufficient documentation

## 2022-12-04 DIAGNOSIS — Z9013 Acquired absence of bilateral breasts and nipples: Secondary | ICD-10-CM | POA: Diagnosis not present

## 2022-12-04 DIAGNOSIS — N6032 Fibrosclerosis of left breast: Secondary | ICD-10-CM | POA: Diagnosis not present

## 2022-12-04 DIAGNOSIS — R0789 Other chest pain: Secondary | ICD-10-CM

## 2022-12-04 HISTORY — PX: MASS EXCISION: SHX2000

## 2022-12-04 HISTORY — PX: SCAR REVISION: SHX5285

## 2022-12-04 LAB — GLUCOSE, CAPILLARY
Glucose-Capillary: 105 mg/dL — ABNORMAL HIGH (ref 70–99)
Glucose-Capillary: 119 mg/dL — ABNORMAL HIGH (ref 70–99)
Glucose-Capillary: 118 mg/dL — ABNORMAL HIGH (ref 70–99)

## 2022-12-04 SURGERY — EXCISION MASS
Anesthesia: General | Laterality: Right

## 2022-12-04 MED ORDER — FENTANYL CITRATE (PF) 250 MCG/5ML IJ SOLN
INTRAMUSCULAR | Status: DC | PRN
  Administered 2022-12-04 (×5): 50 ug via INTRAVENOUS

## 2022-12-04 MED ORDER — PROPOFOL 10 MG/ML IV BOLUS
INTRAVENOUS | Status: AC
  Filled 2022-12-04: qty 20

## 2022-12-04 MED ORDER — LIDOCAINE-EPINEPHRINE 1 %-1:100000 IJ SOLN
INTRAMUSCULAR | Status: AC
  Filled 2022-12-04: qty 1

## 2022-12-04 MED ORDER — OXYCODONE HCL 5 MG PO TABS: 5.0000 mg | ORAL_TABLET | Freq: Four times a day (QID) | ORAL | 0 refills | Status: DC | PRN

## 2022-12-04 MED ORDER — HEPARIN SOD (PORK) LOCK FLUSH 100 UNIT/ML IV SOLN
INTRAVENOUS | Status: AC
  Filled 2022-12-04: qty 10

## 2022-12-04 MED ORDER — LIDOCAINE 2% (20 MG/ML) 5 ML SYRINGE
INTRAMUSCULAR | Status: DC | PRN
  Administered 2022-12-04: 20 mg via INTRAVENOUS

## 2022-12-04 MED ORDER — HEPARIN 6000 UNIT IRRIGATION SOLUTION
Status: AC
  Filled 2022-12-04: qty 500

## 2022-12-04 MED ORDER — CHLORHEXIDINE GLUCONATE CLOTH 2 % EX PADS: 6.0000 | MEDICATED_PAD | Freq: Once | CUTANEOUS | Status: DC

## 2022-12-04 MED ORDER — SUGAMMADEX SODIUM 200 MG/2ML IV SOLN
INTRAVENOUS | Status: DC | PRN
  Administered 2022-12-04: 200 mg via INTRAVENOUS

## 2022-12-04 MED ORDER — INSULIN ASPART 100 UNIT/ML IJ SOLN: 0.0000 [IU] | INTRAMUSCULAR | Status: DC | PRN

## 2022-12-04 MED ORDER — ROCURONIUM BROMIDE 10 MG/ML (PF) SYRINGE
PREFILLED_SYRINGE | INTRAVENOUS | Status: AC
  Filled 2022-12-04: qty 10

## 2022-12-04 MED ORDER — DEXAMETHASONE SODIUM PHOSPHATE 10 MG/ML IJ SOLN
INTRAMUSCULAR | Status: DC | PRN
  Administered 2022-12-04: 4 mg via INTRAVENOUS

## 2022-12-04 MED ORDER — LIDOCAINE 2% (20 MG/ML) 5 ML SYRINGE
INTRAMUSCULAR | Status: AC
  Filled 2022-12-04: qty 5

## 2022-12-04 MED ORDER — 0.9 % SODIUM CHLORIDE (POUR BTL) OPTIME
TOPICAL | Status: DC | PRN
  Administered 2022-12-04: 1000 mL

## 2022-12-04 MED ORDER — ORAL CARE MOUTH RINSE: 15.0000 mL | Freq: Once | OROMUCOSAL | Status: AC

## 2022-12-04 MED ORDER — GABAPENTIN 100 MG PO CAPS
100.0000 mg | ORAL_CAPSULE | ORAL | Status: AC
  Administered 2022-12-04: 100 mg via ORAL
  Filled 2022-12-04: qty 1

## 2022-12-04 MED ORDER — ROCURONIUM BROMIDE 10 MG/ML (PF) SYRINGE
PREFILLED_SYRINGE | INTRAVENOUS | Status: DC | PRN
  Administered 2022-12-04: 50 mg via INTRAVENOUS

## 2022-12-04 MED ORDER — PHENYLEPHRINE HCL-NACL 20-0.9 MG/250ML-% IV SOLN
INTRAVENOUS | Status: DC | PRN
  Administered 2022-12-04: 25 ug/min via INTRAVENOUS

## 2022-12-04 MED ORDER — ACETAMINOPHEN 500 MG PO TABS
1000.0000 mg | ORAL_TABLET | Freq: Once | ORAL | Status: AC
  Administered 2022-12-04: 1000 mg via ORAL
  Filled 2022-12-04: qty 2

## 2022-12-04 MED ORDER — LIDOCAINE-EPINEPHRINE 1 %-1:100000 IJ SOLN: INTRAMUSCULAR | Status: DC | PRN

## 2022-12-04 MED ORDER — FENTANYL CITRATE (PF) 100 MCG/2ML IJ SOLN
INTRAMUSCULAR | Status: AC
  Filled 2022-12-04: qty 2

## 2022-12-04 MED ORDER — HEMOSTATIC AGENTS (NO CHARGE) OPTIME
TOPICAL | Status: DC | PRN
  Administered 2022-12-04: 1 via TOPICAL

## 2022-12-04 MED ORDER — LACTATED RINGERS IV SOLN: INTRAVENOUS | Status: DC

## 2022-12-04 MED ORDER — DEXAMETHASONE SODIUM PHOSPHATE 10 MG/ML IJ SOLN
INTRAMUSCULAR | Status: AC
  Filled 2022-12-04: qty 1

## 2022-12-04 MED ORDER — BUPIVACAINE HCL (PF) 0.25 % IJ SOLN
INTRAMUSCULAR | Status: AC
  Filled 2022-12-04: qty 30

## 2022-12-04 MED ORDER — CHLORHEXIDINE GLUCONATE 0.12 % MT SOLN
15.0000 mL | Freq: Once | OROMUCOSAL | Status: AC
  Administered 2022-12-04: 15 mL via OROMUCOSAL
  Filled 2022-12-04: qty 15

## 2022-12-04 MED ORDER — ONDANSETRON HCL 4 MG/2ML IJ SOLN
INTRAMUSCULAR | Status: DC | PRN
  Administered 2022-12-04: 4 mg via INTRAVENOUS

## 2022-12-04 MED ORDER — CEFAZOLIN SODIUM-DEXTROSE 2-4 GM/100ML-% IV SOLN
2.0000 g | INTRAVENOUS | Status: AC
  Administered 2022-12-04: 2 g via INTRAVENOUS
  Filled 2022-12-04: qty 100

## 2022-12-04 MED ORDER — ACETAMINOPHEN 500 MG PO TABS: 1000.0000 mg | ORAL_TABLET | ORAL | Status: DC

## 2022-12-04 MED ORDER — ONDANSETRON HCL 4 MG/2ML IJ SOLN
INTRAMUSCULAR | Status: AC
  Filled 2022-12-04: qty 2

## 2022-12-04 MED ORDER — FENTANYL CITRATE (PF) 100 MCG/2ML IJ SOLN
25.0000 ug | INTRAMUSCULAR | Status: DC | PRN
  Administered 2022-12-04 (×3): 50 ug via INTRAVENOUS

## 2022-12-04 MED ORDER — OXYCODONE HCL 5 MG PO TABS
5.0000 mg | ORAL_TABLET | Freq: Once | ORAL | Status: AC
  Administered 2022-12-04: 5 mg via ORAL

## 2022-12-04 MED ORDER — PHENYLEPHRINE 80 MCG/ML (10ML) SYRINGE FOR IV PUSH (FOR BLOOD PRESSURE SUPPORT)
PREFILLED_SYRINGE | INTRAVENOUS | Status: DC | PRN
  Administered 2022-12-04: 160 ug via INTRAVENOUS

## 2022-12-04 MED ORDER — PROPOFOL 10 MG/ML IV BOLUS
INTRAVENOUS | Status: DC | PRN
  Administered 2022-12-04: 30 mg via INTRAVENOUS
  Administered 2022-12-04: 70 mg via INTRAVENOUS

## 2022-12-04 MED ORDER — OXYCODONE HCL 5 MG PO TABS
ORAL_TABLET | ORAL | Status: AC
  Filled 2022-12-04: qty 1

## 2022-12-04 MED ORDER — FENTANYL CITRATE (PF) 250 MCG/5ML IJ SOLN
INTRAMUSCULAR | Status: AC
  Filled 2022-12-04: qty 5

## 2022-12-04 SURGICAL SUPPLY — 95 items
BAG COUNTER SPONGE SURGICOUNT (BAG) ×6 IMPLANT
BAG DECANTER FOR FLEXI CONT (MISCELLANEOUS) ×6 IMPLANT
BENZOIN TINCTURE PRP APPL 2/3 (GAUZE/BANDAGES/DRESSINGS) ×3 IMPLANT
BINDER BREAST LRG (GAUZE/BANDAGES/DRESSINGS) IMPLANT
BINDER BREAST XLRG (GAUZE/BANDAGES/DRESSINGS) IMPLANT
BINDER BREAST XXLRG (GAUZE/BANDAGES/DRESSINGS) IMPLANT
BIOPATCH RED 1 DISK 7.0 (GAUZE/BANDAGES/DRESSINGS) ×3 IMPLANT
BNDG COHESIVE 6X5 TAN STRL LF (GAUZE/BANDAGES/DRESSINGS) ×3 IMPLANT
CANISTER SUCT 3000ML PPV (MISCELLANEOUS) ×6 IMPLANT
CHLORAPREP W/TINT 26 (MISCELLANEOUS) ×3 IMPLANT
CLIP VESOCCLUDE MED 6/CT (CLIP) ×3 IMPLANT
CNTNR URN SCR LID CUP LEK RST (MISCELLANEOUS) IMPLANT
CONT SPEC 4OZ STRL OR WHT (MISCELLANEOUS)
COVER MAYO STAND STRL (DRAPES) ×3 IMPLANT
COVER PROBE W GEL 5X96 (DRAPES) IMPLANT
COVER SURGICAL LIGHT HANDLE (MISCELLANEOUS) ×6 IMPLANT
COVER TRANSDUCER ULTRASND GEL (DISPOSABLE) IMPLANT
DERMABOND ADVANCED .7 DNX12 (GAUZE/BANDAGES/DRESSINGS) ×6 IMPLANT
DRAIN CHANNEL 19F RND (DRAIN) ×3 IMPLANT
DRAIN JP 15F RND TROCAR (DRAIN) IMPLANT
DRAPE C-ARM 42X120 X-RAY (DRAPES) ×3 IMPLANT
DRAPE CHEST BREAST 15X10 FENES (DRAPES) ×3 IMPLANT
DRAPE HALF SHEET 40X57 (DRAPES) ×6 IMPLANT
DRAPE LAPAROSCOPIC ABDOMINAL (DRAPES) IMPLANT
DRAPE LAPAROTOMY 100X72 PEDS (DRAPES) IMPLANT
DRAPE ORTHO SPLIT 77X108 STRL (DRAPES) ×6
DRAPE SURG 17X23 STRL (DRAPES) ×12 IMPLANT
DRAPE SURG ORHT 6 SPLT 77X108 (DRAPES) ×6 IMPLANT
DRAPE WARM FLUID 44X44 (DRAPES) ×3 IMPLANT
DRSG MEPILEX POST OP 4X12 (GAUZE/BANDAGES/DRESSINGS) IMPLANT
DRSG TEGADERM 4X4.75 (GAUZE/BANDAGES/DRESSINGS) IMPLANT
ELECT BLADE 4.0 EZ CLEAN MEGAD (MISCELLANEOUS) ×3
ELECT COATED BLADE 2.86 ST (ELECTRODE) ×3 IMPLANT
ELECT REM PT RETURN 9FT ADLT (ELECTROSURGICAL) ×6
ELECTRODE BLDE 4.0 EZ CLN MEGD (MISCELLANEOUS) ×3 IMPLANT
ELECTRODE REM PT RTRN 9FT ADLT (ELECTROSURGICAL) ×6 IMPLANT
EVACUATOR SILICONE 100CC (DRAIN) ×3 IMPLANT
GAUZE 4X4 16PLY ~~LOC~~+RFID DBL (SPONGE) ×3 IMPLANT
GAUZE PAD ABD 8X10 STRL (GAUZE/BANDAGES/DRESSINGS) ×6 IMPLANT
GAUZE SPONGE 4X4 12PLY STRL (GAUZE/BANDAGES/DRESSINGS) ×6 IMPLANT
GEL ULTRASOUND 20GR AQUASONIC (MISCELLANEOUS) IMPLANT
GLOVE BIO SURGEON STRL SZ 6 (GLOVE) ×3 IMPLANT
GLOVE BIO SURGEON STRL SZ8 (GLOVE) ×3 IMPLANT
GLOVE BIOGEL PI IND STRL 8 (GLOVE) ×3 IMPLANT
GLOVE INDICATOR 6.5 STRL GRN (GLOVE) ×3 IMPLANT
GOWN STRL REUS W/ TWL LRG LVL3 (GOWN DISPOSABLE) ×9 IMPLANT
GOWN STRL REUS W/ TWL XL LVL3 (GOWN DISPOSABLE) ×6 IMPLANT
GOWN STRL REUS W/TWL LRG LVL3 (GOWN DISPOSABLE) ×9
GOWN STRL REUS W/TWL XL LVL3 (GOWN DISPOSABLE) ×9 IMPLANT
HEMOSTAT ARISTA ABSORB 3G PWDR (HEMOSTASIS) IMPLANT
HEMOSTAT HEMOBLAST BELLOWS (HEMOSTASIS) IMPLANT
KIT BASIN OR (CUSTOM PROCEDURE TRAY) ×6 IMPLANT
KIT TURNOVER KIT B (KITS) ×6 IMPLANT
NDL 22X1.5 STRL (OR ONLY) (MISCELLANEOUS) ×3 IMPLANT
NDL FILTER BLUNT 18X1 1/2 (NEEDLE) IMPLANT
NDL HYPO 25GX1X1/2 BEV (NEEDLE) ×3 IMPLANT
NEEDLE 22X1.5 STRL (OR ONLY) (MISCELLANEOUS) ×3 IMPLANT
NEEDLE FILTER BLUNT 18X1 1/2 (NEEDLE) IMPLANT
NEEDLE HYPO 25GX1X1/2 BEV (NEEDLE) ×3 IMPLANT
NS IRRIG 1000ML POUR BTL (IV SOLUTION) ×9 IMPLANT
PACK GENERAL/GYN (CUSTOM PROCEDURE TRAY) ×6 IMPLANT
PACK UNIVERSAL I (CUSTOM PROCEDURE TRAY) ×3 IMPLANT
PAD ARMBOARD 7.5X6 YLW CONV (MISCELLANEOUS) ×9 IMPLANT
PENCIL BUTTON HOLSTER BLD 10FT (ELECTRODE) ×3 IMPLANT
PENCIL SMOKE EVACUATOR (MISCELLANEOUS) ×6 IMPLANT
PIN SAFETY STERILE (MISCELLANEOUS) ×3 IMPLANT
POSITIONER HEAD DONUT 9IN (MISCELLANEOUS) ×3 IMPLANT
SOLUTION BETADINE 4OZ (MISCELLANEOUS) ×3 IMPLANT
SPECIMEN JAR SMALL (MISCELLANEOUS) ×3 IMPLANT
SPIKE FLUID TRANSFER (MISCELLANEOUS) ×6 IMPLANT
SPONGE T-LAP 4X18 ~~LOC~~+RFID (SPONGE) IMPLANT
STAPLER VISISTAT 35W (STAPLE) IMPLANT
STRIP CLOSURE SKIN 1/2X4 (GAUZE/BANDAGES/DRESSINGS) ×3 IMPLANT
SUT MNCRL AB 3-0 PS2 18 (SUTURE) IMPLANT
SUT MNCRL AB 4-0 PS2 18 (SUTURE) IMPLANT
SUT MON AB 4-0 PC3 18 (SUTURE) ×3 IMPLANT
SUT PDS AB 2-0 CT1 27 (SUTURE) IMPLANT
SUT PROLENE 2 0 SH DA (SUTURE) ×6 IMPLANT
SUT PROLENE 4 0 PS 2 18 (SUTURE) IMPLANT
SUT SILK 2 0 PERMA HAND 18 BK (SUTURE) ×3 IMPLANT
SUT SILK 3 0 SH 30 (SUTURE) IMPLANT
SUT VIC AB 3-0 SH 27 (SUTURE) ×3
SUT VIC AB 3-0 SH 27X BRD (SUTURE) ×3 IMPLANT
SUT VIC AB 3-0 SH 8-18 (SUTURE) IMPLANT
SYR 5ML LUER SLIP (SYRINGE) ×3 IMPLANT
SYR CONTROL 10ML LL (SYRINGE) ×3 IMPLANT
TAPE CLOTH SURG 4X10 WHT LF (GAUZE/BANDAGES/DRESSINGS) IMPLANT
TOWEL GREEN STERILE (TOWEL DISPOSABLE) ×6 IMPLANT
TOWEL GREEN STERILE FF (TOWEL DISPOSABLE) ×6 IMPLANT
TRAY FOLEY MTR SLVR 14FR STAT (SET/KITS/TRAYS/PACK) IMPLANT
TRAY LAPAROSCOPIC MC (CUSTOM PROCEDURE TRAY) ×3 IMPLANT
TUBE CONNECTING 12X1/4 (SUCTIONS) IMPLANT
TUBING INFILTRATION IT-10001 (TUBING) ×3 IMPLANT
TUBING SET GRADUATE ASPIR 12FT (MISCELLANEOUS) ×3 IMPLANT
YANKAUER SUCT BULB TIP NO VENT (SUCTIONS) IMPLANT

## 2022-12-04 NOTE — Op Note (Signed)
PRE-OPERATIVE DIAGNOSIS: recurrent right breast cancer, BRCA 2, cardiomyopathy secondary to chemotherapy.    POST-OPERATIVE DIAGNOSIS:  Same  PROCEDURE:  Procedure(s): Excision of right chest wall malignant mass (breast cancer recurrence), revision of right mastectomy scar  SURGEON:  Surgeon(s): Stark Klein, MD  ASSIST: Pryor Curia, RNFA.   ANESTHESIA:   general  DRAINS: (19 Fr) Blake drain(s) in the right chest wall    LOCAL MEDICATIONS USED:  BUPIVICAINE  and LIDOCAINE   SPECIMEN:  Source of Specimen:  right mastectomy scar with mass, additional inferior margin  DISPOSITION OF SPECIMEN:  PATHOLOGY  COUNTS:  YES  DICTATION: .Dragon Dictation  PLAN OF CARE: Discharge to home after PACU  PATIENT DISPOSITION:  PACU - hemodynamically stable.  FINDINGS:  scar/excess tissue removed was 18 x 7.5 x 3 cm and mass was 3 x 3 x 2 cm.    EBL: min  PROCEDURE:  Pt was identified in the holding area and taken to the OR where she was laid supine on the OR table.  Sequential compression devices were placed.  General anesthesia was induced.  The right chest wall and arm were prepped and draped in sterile fashion.  A time out was performed according to the surgical safety checklist.  When all was correct, we continued.    A magnet was placed over the pacer on the right upper chest.    An incision line was drawn out above and below the prior scar to incorporate the excess tissue and the mass.  This was made with a #10 blade.  The mass was close to the pacemaker.  This was carefully dissected away from the pacer.  There was scar tissue adjacent to the pacer, but it didn't feel like tumor at that point.  The margin was marked specifically that was close to the pacemaker.   The cautery was used to perform the dissection.  Additional skin was taken in this region to assist with the incision lying flat.  The superior flap could not be mobilized much given the prior radiation and the scar tissue.   The inferior flap was elevated and the cautery was used to mobilize the skin/subcutaneous fat away from the muscle.  Laterally, there was some fatty tissue that was excess and this was also removed to facilitate a flat closure.  The axilla was examined.  There were no palpable nodes.  The axilla was not remapped as there appears to be metastatic disease.    The incision was irrigated and hemostasis was achieved.  Hemoblast was applied to the chest wall.  A 19 Fr blake drain was placed laterally.  The skin was then reapproximated with 3-0 vicryl deep dermal interrupted sutures and 4-0 monocryl running subcuticular suture.     The skin was then cleaned, dried, and dressed with dermabond and steristrips.  Counts were correct.    She was left in the OR for Dr. Lovena Le to work on the other side.

## 2022-12-04 NOTE — Interval H&P Note (Signed)
History and Physical Interval Note: No change in physical exam or indication for surgery. Surgical site marked. Will proceed with excision and revision of left mastectomy scar ar [name] request  12/04/2022 11:56 AM  Jordan Willis  has presented today for surgery, with the diagnosis of BREAST CANCER.  The various methods of treatment have been discussed with the patient and family. After consideration of risks, benefits and other options for treatment, the patient has consented to  Procedure(s): EXCISION OF RIGHT CHEST WALL MASS (Right) REVISION OF MASTECTOMY SCAR (N/A) RIGHT SENTINEL NODE BIOPSY (Right) INSERTION PORT-A-CATH (N/A) Left Mastectomy scar revision (Left) as a surgical intervention.  The patient's history has been reviewed, patient examined, no change in status, stable for surgery.  I have reviewed the patient's chart and labs.  Questions were answered to the patient's satisfaction.     Camillia Herter

## 2022-12-04 NOTE — Anesthesia Procedure Notes (Signed)
Procedure Name: Intubation Date/Time: 12/04/2022 12:13 PM  Performed by: Lowella Dell, CRNAPre-anesthesia Checklist: Patient identified, Emergency Drugs available, Suction available and Patient being monitored Patient Re-evaluated:Patient Re-evaluated prior to induction Oxygen Delivery Method: Circle System Utilized Preoxygenation: Pre-oxygenation with 100% oxygen Induction Type: IV induction Ventilation: Mask ventilation without difficulty Laryngoscope Size: Mac and 3 Grade View: Grade I Tube type: Oral Number of attempts: 1 Airway Equipment and Method: Stylet Placement Confirmation: ETT inserted through vocal cords under direct vision, positive ETCO2 and breath sounds checked- equal and bilateral Secured at: 22 cm Tube secured with: Tape Dental Injury: Teeth and Oropharynx as per pre-operative assessment

## 2022-12-04 NOTE — Interval H&P Note (Signed)
History and Physical Interval Note:  12/04/2022 10:59 AM  Jordan Willis  has presented today for surgery, with the diagnosis of BREAST CANCER.  The various methods of treatment have been discussed with the patient and family. After consideration of risks, benefits and other options for treatment, the patient has consented to  Procedure(s): EXCISION OF RIGHT CHEST WALL MASS (Right) REVISION OF MASTECTOMY SCAR (N/A) RIGHT axillary lymph node biopsy as a surgical intervention.  The patient's history has been reviewed, patient examined, no change in status, stable for surgery.  I have reviewed the patient's chart and labs.  Questions were answered to the patient's satisfaction.     Stark Klein

## 2022-12-04 NOTE — Discharge Instructions (Addendum)
[facility] Office Phone Number [phone]  BREAST BIOPSY/ PARTIAL MASTECTOMY: POST OP INSTRUCTIONS  Always review your discharge instruction sheet given to you by the facility where your surgery was performed.  IF YOU HAVE DISABILITY OR FAMILY LEAVE FORMS, YOU MUST BRING THEM TO THE OFFICE FOR PROCESSING.  DO NOT GIVE THEM TO YOUR DOCTOR.  Take 2 tylenol (acetominophen) three times a day for 3 days.  If you still have pain, add ibuprofen with food in between if able to take this (if you have kidney issues or stomach issues, do not take ibuprofen).  If both of those are not enough, add the narcotic pain pill.  If you find you are needing a lot of this overnight after surgery, call the next morning for a refill.    Prescriptions will not be filled after 5pm or on week-ends. Take your usually prescribed medications unless otherwise directed You should eat very light the first 24 hours after surgery, such as soup, crackers, pudding, etc.  Resume your normal diet the day after surgery. Most patients will experience some swelling and bruising in the breast.  Ice packs and a good support bra will help.  Swelling and bruising can take several days to resolve.  It is common to experience some constipation if taking pain medication after surgery.  Increasing fluid intake and taking a stool softener will usually help or prevent this problem from occurring.  A mild laxative (Milk of Magnesia or Miralax) should be taken according to package directions if there are no bowel movements after 48 hours. Unless discharge instructions indicate otherwise, you may remove your bandages 48 hours after surgery, and you may shower at that time.  You may have steri-strips (small skin tapes) in place directly over the incision.  These strips should be left on the skin at least for for 7-10 days.    ACTIVITIES:  You may resume regular daily activities (gradually increasing) beginning the next day.  Wearing a  good support bra or sports bra (or the breast binder) minimizes pain and swelling.  You may have sexual intercourse when it is comfortable. No heavy lifting for 1-2 weeks (not over around 10 pounds).  You may drive when you no longer are taking prescription pain medication, you can comfortably wear a seatbelt, and you can safely maneuver your car and apply brakes. RETURN TO WORK:  __________3-14 days depending on job. _______________ You should see your doctor in the office for a follow-up appointment approximately two weeks after your surgery.  Your doctor's nurse will typically make your follow-up appointment when she calls you with your pathology report.  Expect your pathology report 3-4 business days after your surgery.  You may call to check if you do not hear from us after three days.   WHEN TO CALL YOUR DOCTOR: Fever over 101.0 Nausea and/or vomiting. Extreme swelling or bruising. Continued bleeding from incision. Increased pain, redness, or drainage from the incision.  The clinic staff is available to answer your questions during regular business hours.  Please don't hesitate to call and ask to speak to one of the nurses for clinical concerns.  If you have a medical emergency, go to the nearest emergency room or call 911.  A surgeon from [facility] is always on call at the hospital.  For further questions, please visit [email or web address]   

## 2022-12-04 NOTE — Transfer of Care (Signed)
Immediate Anesthesia Transfer of Care Note  Patient: Jordan Willis  Procedure(s) Performed: EXCISION OF RIGHT CHEST WALL MASS (Right) REVISION OF MASTECTOMY SCAR Left Mastectomy scar revision (Left)  Patient Location: PACU  Anesthesia Type:General  Level of Consciousness: awake, alert , and oriented  Airway & Oxygen Therapy: Patient Spontanous Breathing and Patient connected to face mask oxygen  Post-op Assessment: Report given to RN and Post -op Vital signs reviewed and stable  Post vital signs: Reviewed and stable  Last Vitals:  Vitals Value Taken Time  BP 123/63 12/04/22 1446  Temp 36.3 C 12/04/22 1436  Pulse 90 12/04/22 1449  Resp 14 12/04/22 1449  SpO2 93 % 12/04/22 1449  Vitals shown include unvalidated device data.  Last Pain:  Vitals:   12/04/22 1436  TempSrc:   PainSc: 8          Complications: No notable events documented.

## 2022-12-05 ENCOUNTER — Other Ambulatory Visit (HOSPITAL_COMMUNITY): Payer: Medicare Other

## 2022-12-05 ENCOUNTER — Encounter (HOSPITAL_COMMUNITY): Payer: Self-pay | Admitting: General Surgery

## 2022-12-05 ENCOUNTER — Ambulatory Visit (INDEPENDENT_AMBULATORY_CARE_PROVIDER_SITE_OTHER): Payer: Self-pay | Admitting: Physician Assistant

## 2022-12-05 DIAGNOSIS — Z9889 Other specified postprocedural states: Secondary | ICD-10-CM

## 2022-12-05 NOTE — Anesthesia Postprocedure Evaluation (Signed)
Anesthesia Post Note  Patient: LIAH STARBUCK  Procedure(s) Performed: EXCISION OF RIGHT CHEST WALL MASS (Right) REVISION OF MASTECTOMY SCAR Left Mastectomy scar revision (Left)     Patient location during evaluation: PACU Anesthesia Type: General Level of consciousness: awake and alert Pain management: pain level controlled Vital Signs Assessment: post-procedure vital signs reviewed and stable Respiratory status: spontaneous breathing, nonlabored ventilation, respiratory function stable and patient connected to nasal cannula oxygen Cardiovascular status: blood pressure returned to baseline and stable Postop Assessment: no apparent nausea or vomiting Anesthetic complications: no  No notable events documented.  Last Vitals:  Vitals:   12/04/22 1515 12/04/22 1530  BP: 114/81 (!) 140/63  Pulse: 76 81  Resp: 18 13  Temp:  (!) 36.3 C  SpO2: 99% 92%    Last Pain:  Vitals:   12/04/22 1508  TempSrc:   PainSc: Asleep   Pain Goal:                   Artina Minella L Savva Beamer

## 2022-12-05 NOTE — Op Note (Signed)
DATE OF OPERATION: 12/05/2022  LOCATION: [facility] Main operating Room  PREOPERATIVE DIAGNOSIS: Persistent draining wound, left mastectomy incision  POSTOPERATIVE DIAGNOSIS: Same  PROCEDURE: Excision of mastectomy scar, debridement of 5 cm of granulation tissue, complex closure of 12 cm incision  SURGEON: Jeanann Lewandowsky, MD  ASSISTANT: Krista Blue, primary, Donnamarie Rossetti  EBL: 10 cc  CONDITION: Stable  COMPLICATIONS: None  INDICATION: The patient, Jordan Willis, is a 72 y.o. female born on 1951/10/20, is here for treatment of persistent drainage from the left mastectomy incision.   PROCEDURE DETAILS:  The patient was seen prior to surgery and marked.  IV antibiotics were given. The patient was taken to the operating room by [name] and given a general anesthetic.  Once [name] had completed her portion of the procedure a second time out was performed for the left mastectomy scar excision and all information was confirmed by those in the room. SCDs were placed.  I made an elliptical incision around the previous mastectomy scar.  The lateral portion of the scar was densely adherent to the underlying chest wall.  On inspection of the previous mastectomy site I found an area approximately 5 cm in length of fibrinous granulation tissue in the base of the wound.  This tissue was excised and a portion was sent for culture and the remainder was sent with the mastectomy scar to pathology for routine examination.  A curette was then used to completely debride the wound.  The wound was irrigated with saline.  Meticulous hemostasis was achieved with the electrocautery.  I mobilized the superior and inferior skin flaps to allow for a tension-free closure and additional soft tissue to be approximated at the base of the wound.  The deep tissues were approximated with a 3-0 Monocryl suture the dermis was also approximated with a 3-0 Monocryl suture and the skin was closed with a running 4-0 Prolene suture.   Prior to closure of the wound a drain was placed and brought out through a separate stab incision.  Dressings were applied. The patient was allowed to wake up and taken to recovery room in stable condition at the end of the case. The family was notified at the end of the case.   The advanced practice practitioner (APP) assisted throughout the case.  The APP was essential in retraction and counter traction when needed to make the case progress smoothly.  This retraction and assistance made it possible to see the tissue plans for the procedure.  The assistance was needed for blood control, tissue re-approximation and assisted with closure of the incision site.

## 2022-12-05 NOTE — Progress Notes (Signed)
Patient is a pleasant 72 year old female with PMH of breast cancer and bilateral breast radiation s/p reconstruction 11/2021 and removal 01/2022 with chronic left mastectomy site drainage now s/p left-sided scar revision at time of right-sided lumpectomy performed in conjunction by Dr. Lovena Le and [name] 12/04/2022 who joins via telephone.  The patient was at home and this provider was calling from their office.  A total of 7 minutes was spent speaking with the patient and reviewing chart.    She tells me that her pain has been well-controlled with the prescribed analgesics.  Occasional muscle spasms are improved with muscle relaxants.  She is eating, drinking, voiding, and ambulatory.  Overall she is doing well.    Discussed leaving the bordered Mepilex dressing in place until her scheduled visit next week.  Informed patient that her left-sided scar revision was repaired using running Prolene sutures that will need to be removed at a subsequent encounter, likely in 2 to 3 weeks.  She will continue to wear her compressive binder and will call the clinic should she have any questions or concerns in the interim.

## 2022-12-06 LAB — SURGICAL PATHOLOGY

## 2022-12-07 ENCOUNTER — Telehealth: Payer: Self-pay | Admitting: Hematology and Oncology

## 2022-12-07 NOTE — Telephone Encounter (Signed)
Scheduled appointment per provider PAL. Patient is aware of the changes made to her upcoming appointment.

## 2022-12-09 LAB — AEROBIC/ANAEROBIC CULTURE W GRAM STAIN (SURGICAL/DEEP WOUND)
Culture: NO GROWTH
Gram Stain: NONE SEEN

## 2022-12-10 DIAGNOSIS — E118 Type 2 diabetes mellitus with unspecified complications: Secondary | ICD-10-CM | POA: Diagnosis not present

## 2022-12-10 DIAGNOSIS — I10 Essential (primary) hypertension: Secondary | ICD-10-CM | POA: Diagnosis not present

## 2022-12-10 DIAGNOSIS — E059 Thyrotoxicosis, 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: Secondary | ICD-10-CM | POA: Diagnosis not present

## 2022-12-10 DIAGNOSIS — E1165 Type 2 diabetes mellitus with hyperglycemia: Secondary | ICD-10-CM | POA: Diagnosis not present

## 2022-12-10 DIAGNOSIS — E042 Nontoxic multinodular goiter: Secondary | ICD-10-CM | POA: Diagnosis not present

## 2022-12-12 ENCOUNTER — Encounter: Payer: Self-pay | Admitting: *Deleted

## 2022-12-13 ENCOUNTER — Ambulatory Visit: Payer: 59 | Admitting: Cardiology

## 2022-12-13 ENCOUNTER — Encounter: Payer: Self-pay | Admitting: Cardiology

## 2022-12-13 ENCOUNTER — Telehealth: Payer: Self-pay

## 2022-12-13 ENCOUNTER — Ambulatory Visit (INDEPENDENT_AMBULATORY_CARE_PROVIDER_SITE_OTHER): Payer: 59 | Admitting: Physician Assistant

## 2022-12-13 ENCOUNTER — Encounter: Payer: Self-pay | Admitting: Physician Assistant

## 2022-12-13 VITALS — BP 117/56 | HR 85

## 2022-12-13 VITALS — BP 129/57 | HR 67 | Resp 16 | Ht 67.5 in | Wt 184.2 lb

## 2022-12-13 DIAGNOSIS — I428 Other cardiomyopathies: Secondary | ICD-10-CM

## 2022-12-13 DIAGNOSIS — I5042 Chronic combined systolic (congestive) and diastolic (congestive) heart failure: Secondary | ICD-10-CM | POA: Diagnosis not present

## 2022-12-13 DIAGNOSIS — E119 Type 2 diabetes mellitus without complications: Secondary | ICD-10-CM

## 2022-12-13 DIAGNOSIS — Z95810 Presence of automatic (implantable) cardiac defibrillator: Secondary | ICD-10-CM | POA: Diagnosis not present

## 2022-12-13 DIAGNOSIS — Z171 Estrogen receptor negative status [ER-]: Secondary | ICD-10-CM

## 2022-12-13 DIAGNOSIS — I10 Essential (primary) hypertension: Secondary | ICD-10-CM | POA: Diagnosis not present

## 2022-12-13 DIAGNOSIS — E782 Mixed hyperlipidemia: Secondary | ICD-10-CM

## 2022-12-13 DIAGNOSIS — I447 Left bundle-branch block, unspecified: Secondary | ICD-10-CM

## 2022-12-13 DIAGNOSIS — C50412 Malignant neoplasm of upper-outer quadrant of left female breast: Secondary | ICD-10-CM | POA: Diagnosis not present

## 2022-12-13 DIAGNOSIS — Z9889 Other specified postprocedural states: Secondary | ICD-10-CM

## 2022-12-13 MED ORDER — ENTRESTO 49-51 MG PO TABS: 1.0000 | ORAL_TABLET | Freq: Two times a day (BID) | ORAL | 1 refills | Status: DC

## 2022-12-13 NOTE — Progress Notes (Signed)
Patient is a pleasant 72 year old female with PMH of breast cancer and bilateral breast radiation s/p reconstruction 11/2021 and removal 01/2022 with chronic left mastectomy site drainage now s/p left-sided scar revision at time of right-sided lumpectomy performed in conjunction by Dr. Lovena Le and [name] 12/04/2022 who presents to clinic for postoperative follow-up.    Today, patient is accompanied by son and daughter at bedside.  She states that overall she has been doing well since her surgery.  She does endorse some right-sided discomfort in the area of her drain tube, but denies any significant discomfort on the left.  She reports 75 cc/day output from right-sided drain and only 20 cc/day output from left-sided drain.  She has been ambulatory, eating and drinking without difficulty, voiding.  Denies any leg swelling, fevers, or other systemic symptoms.  On exam, bordered Mepilex dressing is removed from left-sided scar revision.  Running Prolene sutures in place, good skin edge approximation.  CDI.  JP drain intact and functional, normal-appearing output in bulb.  No significant swelling, redness, or other overlying skin changes.  No leg swelling noted.  Will leave right-sided lumpectomy site dressings in place and defer drain management to general surgery team.  Left-sided drain removed without complication or difficulty at bedside.  Plan for return in 1 week for removal of running Prolene sutures.  ABD placed over revision site in interim for comfort.  Will obtain postoperative photos at subsequent encounter.  Dr. Lovena Le also personally evaluated patient and agrees with assessment and plan.

## 2022-12-13 NOTE — Progress Notes (Signed)
Jordan Willis Date of Birth: 1951/03/05 MRN: [id number] Primary Care Provider:Pharr, Thayer Jew, MD  Former Cardiology Providers: Miquel Dunn, MSN, APRN, FNP-C Primary Cardiologist: Rex Kras, DO (established care 01/20/2020)  Date: 12/13/22 Last Office Visit: 08/08/2022  Chief Complaint  Patient presents with   Cardiomyopathy   Follow-up    4 months   HPI  Jordan Willis is a 72 y.o.  female whose past medical history and cardiovascular risk factors include: Left bundle branch block, type 2 diabetes mellitus, hyperlipidemia, nonischemic cardiomyopathy, chronic combined systolic/diastolic heart failure, recurrent breast cancer status post bilateral mastectomies, postmenopausal female, advanced age.  Patient presents today accompanied by her son and daughter who provide collateral history as part of today's encounter.  Back in June 2022 she had low normal LVEF and after recurrence of breast cancer she was placed on Herceptin for approximately 3 months  Jan 2023-April 2023.  LVEF in April 2023 was noted to be 35-40% and Herceptin was discontinued.  Despite stopping Herceptin her LVEF has not improved.  She has been on maximally tolerated GDMT and also underwent BiV ICD placement in November 2023.  She had a repeat echocardiogram in January 2024 without any significant improvement in LVEF. She follows [name] from hematology/oncology and wanted to consider a trial of anti-HER2 therapy (Kadcyla).  After discussion w/ [name] given the risk of worsening cardiotoxicity the shared decision was to continue monitoring/surveillance.  Clinically she denies chest pain or heart failure symptoms.  She feels tired/fatigue due to recent surgical interventions given her underlying breast cancer.  FUNCTIONAL STATUS: No structured exercise program or daily routine.  ALLERGIES: Allergies  Allergen Reactions   Bactrim [Sulfamethoxazole-Trimethoprim] Shortness Of Breath    Shortness of  breath, difficulty breathing, headache, rash, fatigue   Aspirin Palpitations and Other (See Comments)    Heart fluttering, pt takes 40.5 mg (half of 81 mg) daily Other reaction(s): bradycardia, Not available   Sulfa Antibiotics Hives   Tape Rash    Tears skin off   MEDICATION LIST PRIOR TO VISIT: Current Outpatient Medications on File Prior to Visit  Medication Sig Dispense Refill   albuterol (VENTOLIN HFA) 108 (90 Base) MCG/ACT inhaler Inhale 2 puffs into the lungs every 6 (six) hours as needed for wheezing or shortness of breath.     b complex vitamins capsule Take 1 capsule by mouth daily.     baclofen (LIORESAL) 10 MG tablet Take 10 mg by mouth 3 (three) times daily.     bumetanide (BUMEX) 1 MG tablet TAKE 1 TABLET BY MOUTH EVERY DAY 90 tablet 1   cetirizine (ZYRTEC) 10 MG tablet Take 10 mg by mouth in the morning.     Cholecalciferol (VITAMIN D3) 25 MCG (1000 UT) CAPS Take 1,000 Units by mouth in the morning.     diclofenac Sodium (VOLTAREN) 1 % GEL Apply 1 Application topically 4 (four) times daily as needed (pain).     exemestane (AROMASIN) 25 MG tablet Take 1 tablet (25 mg total) by mouth daily after breakfast. 90 tablet 3   FARXIGA 10 MG TABS tablet TAKE 1 TABLET BY MOUTH EVERY DAY BEFORE BREAKFAST 90 tablet 0   fluticasone (FLONASE) 50 MCG/ACT nasal spray Place 1 spray into both nostrils in the morning.     GLUCOSAMINE-CHONDROITIN PO Take 1 tablet by mouth every evening.      glucose blood (ONETOUCH VERIO) test strip TEST ONCE DAILY AS DIRECTED 90     ibuprofen (ADVIL) 200 MG tablet Take  400 mg by mouth every 6 (six) hours as needed for mild pain or moderate pain.     metFORMIN (GLUCOPHAGE-XR) 500 MG 24 hr tablet Take 500 mg by mouth 2 (two) times daily.     metoprolol succinate (TOPROL-XL) 25 MG 24 hr tablet TAKE 1 TABLET (25 MG TOTAL) BY MOUTH IN THE MORNING. HOLD IF SYSTOLIC BLOOD PRESSURE (TOP BLOOD PRESSURE NUMBER) LESS THAN 100 MMHG OR HEART RATE LESS THAN 60 BPM (PULSE). 90  tablet 3   Multiple Vitamin (MULTIVITAMIN WITH MINERALS) TABS tablet Take 1 tablet by mouth daily. Centrum Silver for Women 50+     nitroGLYCERIN (NITROSTAT) 0.4 MG SL tablet Place 0.4 mg under the tongue every 5 (five) minutes as needed for chest pain.     Olopatadine HCl 0.2 % SOLN Place 1 drop into both eyes daily. Pataday     ondansetron (ZOFRAN) 4 MG tablet Take 1 tablet (4 mg total) by mouth every 6 (six) hours as needed for nausea or vomiting. 20 tablet 0   OneTouch Delica Lancets 99991111 MISC Apply topically.     oxyCODONE (OXY IR/ROXICODONE) 5 MG immediate release tablet Take 1 tablet (5 mg total) by mouth every 6 (six) hours as needed for severe pain or moderate pain. 30 tablet 0   Probiotic Product (PROBIOTIC DAILY PO) Take 1 capsule by mouth in the morning.     rosuvastatin (CRESTOR) 20 MG tablet Take 20 mg by mouth at bedtime.     sertraline (ZOLOFT) 50 MG tablet Take 50 mg by mouth in the morning.     spironolactone (ALDACTONE) 25 MG tablet TAKE 1 TABLET BY MOUTH EVERY DAY IN THE MORNING 90 tablet 0   No current facility-administered medications on file prior to visit.    PAST MEDICAL HISTORY: Past Medical History:  Diagnosis Date   AICD (automatic cardioverter/defibrillator) present    Anemia    Anginal pain (HCC)    Anxiety    Arthritis    Lumbar spine DDD.   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   Diabetes mellitus without complication (HCC)    Type II   Dyspnea    When patient gets sick uses Pro-Air inhaler   Frequent sinus infections    GERD (gastroesophageal reflux disease)    occ   Goiter    History of cardiomegaly    History of cataract    Bilateral   History of kidney stones    08/28/2018 currently has a large kidney stone, asymptomatic at this time   History of migraine     History of uterine fibroid    History of uterine prolapse    Hyperlipidemia    Hypertension    LBBB (left bundle branch block)    Menopause 03/03/2012   Nasal turbinate hypertrophy 11/29/2016   Left inferior    Personal history of chemotherapy    Personal history of radiation therapy    Pneumonia    as a child   PONV (postoperative nausea and vomiting)    Sinusitis 2014   Sleep apnea    No CPAP   SUI (stress urinary incontinence, female)    SVD (spontaneous vaginal delivery)    x 2   Thyroid nodule     PAST SURGICAL HISTORY: Past Surgical History:  Procedure Laterality Date  ABDOMINAL HYSTERECTOMY     BILATERAL SALPINGOOPHORECTOMY Bilateral    BIV ICD INSERTION CRT-D N/A 08/24/2022   Procedure: BIV ICD INSERTION CRT-D;  Surgeon: Evans Lance, MD;  Location: [facility];  Service: Cardiovascular;  Laterality: N/A;   BLADDER SUSPENSION N/A 05/16/2017   Procedure: TRANSVAGINAL TAPE (TVT) PROCEDURE;  Surgeon: Everett Graff, MD;  Location: [facility];  Service: Gynecology;  Laterality: N/A;   BREAST BIOPSY Right 2009   BREAST IMPLANT REMOVAL Bilateral 02/05/2022   Procedure: Removal bilateral breast implants;  Surgeon: [name], MD;  Location: [facility];  Service: Plastics;  Laterality: Bilateral;   BREAST LUMPECTOMY Right 2009   BREAST LUMPECTOMY Left 2006   BREAST RECONSTRUCTION WITH PLACEMENT OF TISSUE EXPANDER AND FLEX HD (ACELLULAR HYDRATED DERMIS) Bilateral 09/04/2021   Procedure: BILATERAL BREAST RECONSTRUCTION WITH PLACEMENT OF TISSUE EXPANDER AND FLEX HD (ACELLULAR HYDRATED DERMIS);  Surgeon: [name], MD;  Location: [facility];  Service: Plastics;  Laterality: Bilateral;   CARDIAC CATHETERIZATION     CATARACT EXTRACTION Left 10/23/2017   CATARACT EXTRACTION Right 11/06/2017   COLONOSCOPY  10/22/2009   Normal.  Repeat 5 years.  [name]   CYSTOSCOPY N/A 05/16/2017   Procedure: CYSTOSCOPY;  Surgeon: Everett Graff, MD;  Location: [facility];  Service:  Gynecology;  Laterality: N/A;   DEBRIDEMENT AND CLOSURE WOUND Bilateral 10/03/2021   Procedure: Debridement bilateral mastectomy flaps;  Surgeon: [name], MD;  Location: [facility];  Service: Plastics;  Laterality: Bilateral;  1.5 hour   EYE SURGERY     bilateral cataracts   LEFT HEART CATH AND CORONARY ANGIOGRAPHY N/A 02/09/2020   Procedure: LEFT HEART CATH AND CORONARY ANGIOGRAPHY;  Surgeon: Nigel Mormon, MD;  Location: [facility];  Service: Cardiovascular;  Laterality: N/A;   LEFT HEART CATH AND CORONARY ANGIOGRAPHY N/A 03/27/2022   Procedure: LEFT HEART CATH AND CORONARY ANGIOGRAPHY;  Surgeon: Adrian Prows, MD;  Location: [facility];  Service: Cardiovascular;  Laterality: N/A;   MASS EXCISION Right 12/04/2022   Procedure: EXCISION OF RIGHT CHEST WALL MASS;  Surgeon: Stark Klein, MD;  Location: [facility];  Service: General;  Laterality: Right;   MASTECTOMY W/ SENTINEL NODE BIOPSY Right 09/04/2021   Procedure: BILATERAL MASTECTOMIES WITH RIGHT SENTINEL LYMPH NODE BIOPSY;  Surgeon: Stark Klein, MD;  Location: [facility];  Service: General;  Laterality: Right;   REMOVAL OF BILATERAL TISSUE EXPANDERS WITH PLACEMENT OF BILATERAL BREAST IMPLANTS Bilateral 10/03/2021   Procedure: REMOVAL OF BILATERAL TISSUE EXPANDERS WITH REPLACEMENT OF TISSUE EXPANDERS;  Surgeon: [name], MD;  Location: [facility];  Service: Plastics;  Laterality: Bilateral;   REMOVAL OF BILATERAL TISSUE EXPANDERS WITH PLACEMENT OF BILATERAL BREAST IMPLANTS Bilateral 12/19/2021   Procedure: REMOVAL OF BILATERAL TISSUE EXPANDERS WITH PLACEMENT OF BILATERAL BREAST IMPLANTS;  Surgeon: [name], MD;  Location: [facility];  Service: Plastics;  Laterality: Bilateral;   ROBOTIC ASSISTED TOTAL HYSTERECTOMY N/A 05/16/2017   Procedure: ROBOTIC ASSISTED TOTAL HYSTERECTOMY;  Surgeon: Delsa Bern, MD;  Location: [facility];  Service: Gynecology;  Laterality: N/A;   ROTATOR CUFF REPAIR Bilateral    SCAR REVISION N/A 12/04/2022    Procedure: REVISION OF MASTECTOMY SCAR;  Surgeon: Stark Klein, MD;  Location: [facility];  Service: General;  Laterality: N/A;   SHOULDER ARTHROSCOPY WITH ROTATOR CUFF REPAIR AND SUBACROMIAL DECOMPRESSION Right 09/03/2018   Procedure: Right shoulder manipulation under anesthesia, exam under anesthesia, mini open rotator cuff repair, subacromial decompression;  Surgeon: Susa Day, MD;  Location: [facility];  Service: Orthopedics;  Laterality: Right;  90 mins   TUBAL LIGATION     WISDOM TOOTH EXTRACTION      FAMILY HISTORY: The patient's family history includes Breast cancer in her sister and sister; Cancer in her sister and sister; Cancer (age of onset: 38) in her mother; Diabetes in her father; Hypertension in her mother.   SOCIAL HISTORY:  The patient  reports that she has never smoked. She has never used smokeless tobacco. She reports that she does not drink alcohol and does not use drugs.  Review of Systems  Cardiovascular:  Positive for dyspnea on exertion. Negative for chest pain, leg swelling, orthopnea, palpitations, paroxysmal nocturnal dyspnea and syncope.  Respiratory:  Negative for cough and shortness of breath.     PHYSICAL EXAM:    12/13/2022   11:30 AM 12/13/2022    8:56 AM 12/04/2022    3:30 PM  Vitals with BMI  Height 5' 7.5"    Weight 184 lbs 3 oz    BMI 123456    Systolic [id number]  Diastolic 57 56 63  Pulse 67 85 81   Physical Exam  Constitutional: No distress.  Age appropriate, hemodynamically stable.   Neck: No JVD present.  Cardiovascular: Normal rate, regular rhythm, S1 normal, S2 normal, intact distal pulses and normal pulses. Exam reveals no gallop, no S3 and no S4.  No murmur heard. Pulmonary/Chest: Effort normal and breath sounds normal. No stridor. She has no wheezes. She has no rales.  Bilateral mastectomies.  Patient wearing a binder.  1 JP drain noted on the right lateral wall.  Abdominal: Soft. Bowel sounds are normal. She exhibits no distension.  There is no abdominal tenderness.  Musculoskeletal:        General: No edema.     Cervical back: Neck supple.  Neurological: She is alert and oriented to person, place, and time. She has intact cranial nerves (2-12).  Skin: Skin is warm and moist.   CARDIAC DATABASE: EKG: 03/13/2022: Normal sinus rhythm, 64 bpm, LAE, LBBB.  Echocardiogram: 03/29/2021:  50-55%, G1DD, moderate MR, Moderate TR, PASP 3mHG.   06/15/2021: LVEF 30-35%, severe hypokinesis of the anteroseptal and apex, moderate to severe LV dysfunction, grade 1 diastolic dysfunction, elevated LAP, global longitudinal strain -16.7%, mild to moderate Mr.   10/09/2021: 45-50%, no regional wall motion abnormalities, global longitudinal strain -13.2%, mild MR.   01/29/2022: 35-40%, average GLS -[id number] grade 1 diastolic impairment, severe hypokinesis of the left ventricle with regional wall motion abnormalities, moderately dilated left atrium, posterior pericardial effusion without tamponade, moderate to severe MR, estimated RAP 8 mmHg.  10/29/2022  1. Left ventricular ejection fraction, by estimation, is 30 to 35%. The  left ventricle has moderately decreased function. The left ventricle  demonstrates regional wall motion abnormalities. Abnormal (paradoxical)  septal motion, consistent with left  bundle branch block      There is mild asymmetric left ventricular hypertrophy of the  basal-septal segment. Left ventricular diastolic parameters are consistent  with Grade I diastolic dysfunction (impaired relaxation).   2. Right ventricular systolic function is low normal. The right  ventricular size is normal. There is normal pulmonary artery systolic  pressure. The estimated right ventricular systolic pressure is 2Q000111QmmHg.   3. The mitral valve is normal in structure. Trivial mitral valve  regurgitation. No evidence of mitral stenosis.   4. Pulmonic valve regurgitation is moderate.   5. The aortic valve is tricuspid. There is mild  calcification  of the  aortic valve. Aortic valve regurgitation is not visualized. Aortic valve  sclerosis/calcification is present, without any evidence of aortic  stenosis.   6. The inferior vena cava is normal in size with greater than 50%  respiratory variability, suggesting right atrial pressure of 3 mmHg.   Stress Testing:  02/14/2018 exercise nuclear stress test at [facility]: Achieved 7 METS, normal blood pressure response to exercise, had exertional chest pain that improved/resolved during recovery, normal myocardial perfusion study no EKG abnormalities per report.  Heart Catheterization: Left Heart Catheterization 03/27/22:  LV: 118/3, EDP 11 mmHg.  Ao 128/56, mean 84 mmHg.  No pressure gradient across the aortic valve. LVEF 30 to 35% with global hypokinesis.  LV is mildly dilated. LM: Large caliber vessels, smooth and normal. LAD: Large vessel, gives origin to several small diagonals.  Smooth and normal.  Mid to distal segment is tortuous and may suggest hypertensive heart disease. RI: Large vessel, gives origin to large lateral branches.  Smooth and normal. CX: Large vessel, gives origin to moderate-sized OM1, 2 and continues as OM 3.  Smooth and normal. RCA: Dominant, continues as a PDA.  Smooth and normal.  Impression: Findings consistent with nonischemic dilated cardiomyopathy.  Severe LV systolic dysfunction.  MUGA scan 05/10/2022: 1. Left ventricular ejection fraction is equal to 35.3%. 2. Moderate to marked septal hypokinesis. 3. Left ventricular dilatation.   LABORATORY DATA:    Latest Ref Rng & Units 11/26/2022    8:21 AM 10/25/2022    1:10 PM 09/19/2022    8:33 AM  CBC  WBC 4.0 - 10.5 K/uL 4.7  5.1  4.8   Hemoglobin 12.0 - 15.0 g/dL 12.9  12.6  11.9   Hematocrit 36.0 - 46.0 % 39.4  38.0  35.4   Platelets 150 - 400 K/uL 254  305  239        Latest Ref Rng & Units 11/26/2022    8:21 AM 09/19/2022    8:33 AM 08/01/2022    8:42 AM  CMP  Glucose 70 -  99 mg/dL 107  101  99   BUN 8 - 23 mg/dL 8  13  11   $ Creatinine 0.44 - 1.00 mg/dL 0.72  0.67  0.80   Sodium 135 - 145 mmol/L 137  135  139   Potassium 3.5 - 5.1 mmol/L 3.7  4.1  4.3   Chloride 98 - 111 mmol/L 102  98  101   CO2 22 - 32 mmol/L 23  24  26   $ Calcium 8.9 - 10.3 mg/dL 9.7  10.2  9.8     Lipid Panel: Outside records 06/24/2018: Total cholesterol 176, triglycerides 104, HDL 52, LDL 103  Lipid Panel  Lab Results  Component Value Date   CHOL 158 10/13/2020   HDL 44 10/13/2020   LDLCALC 94 10/13/2020   TRIG 107 10/13/2020   CHOLHDL 6.2 10/11/2013    Lab Results  Component Value Date   HGBA1C 6.1 (H) 11/26/2022   HGBA1C 7.2 (H) 08/01/2021   HGBA1C 6.1 (H) 08/28/2018   No components found for: "NTPROBNP" Lab Results  Component Value Date   TSH 0.599 10/11/2013   TSH 0.401 07/02/2012    Cardiac Panel (last 3 results) No results for input(s): "CKTOTAL", "CKMB", "TROPONINIHS", "RELINDX" in the last 72 hours.  External Labs: Collected: 09/26/2021 available in Care Everywhere. Sodium 147, potassium 4.6, chloride 107, bicarb 26, BUN 9, creatinine 0.62 AST 25, ALT 39, alkaline phosphatase 88 EGFR 106 Hemoglobin  A1c 7.1 TSH 0.52  Collected 02/16/2021: Total cholesterol 182, triglycerides 118, HDL 44, LDL 114, non-HDL 117   FINAL MEDICATION LIST END OF ENCOUNTER: Meds ordered this encounter  Medications   sacubitril-valsartan (ENTRESTO) 49-51 MG    Sig: Take 1 tablet by mouth 2 (two) times daily.    Dispense:  180 tablet    Refill:  1    Current Outpatient Medications:    albuterol (VENTOLIN HFA) 108 (90 Base) MCG/ACT inhaler, Inhale 2 puffs into the lungs every 6 (six) hours as needed for wheezing or shortness of breath., Disp: , Rfl:    b complex vitamins capsule, Take 1 capsule by mouth daily., Disp: , Rfl:    baclofen (LIORESAL) 10 MG tablet, Take 10 mg by mouth 3 (three) times daily., Disp: , Rfl:    bumetanide (BUMEX) 1 MG tablet, TAKE 1 TABLET BY MOUTH  EVERY DAY, Disp: 90 tablet, Rfl: 1   cetirizine (ZYRTEC) 10 MG tablet, Take 10 mg by mouth in the morning., Disp: , Rfl:    Cholecalciferol (VITAMIN D3) 25 MCG (1000 UT) CAPS, Take 1,000 Units by mouth in the morning., Disp: , Rfl:    diclofenac Sodium (VOLTAREN) 1 % GEL, Apply 1 Application topically 4 (four) times daily as needed (pain)., Disp: , Rfl:    exemestane (AROMASIN) 25 MG tablet, Take 1 tablet (25 mg total) by mouth daily after breakfast., Disp: 90 tablet, Rfl: 3   FARXIGA 10 MG TABS tablet, TAKE 1 TABLET BY MOUTH EVERY DAY BEFORE BREAKFAST, Disp: 90 tablet, Rfl: 0   fluticasone (FLONASE) 50 MCG/ACT nasal spray, Place 1 spray into both nostrils in the morning., Disp: , Rfl:    GLUCOSAMINE-CHONDROITIN PO, Take 1 tablet by mouth every evening. , Disp: , Rfl:    glucose blood (ONETOUCH VERIO) test strip, TEST ONCE DAILY AS DIRECTED 90, Disp: , Rfl:    ibuprofen (ADVIL) 200 MG tablet, Take 400 mg by mouth every 6 (six) hours as needed for mild pain or moderate pain., Disp: , Rfl:    metFORMIN (GLUCOPHAGE-XR) 500 MG 24 hr tablet, Take 500 mg by mouth 2 (two) times daily., Disp: , Rfl:    metoprolol succinate (TOPROL-XL) 25 MG 24 hr tablet, TAKE 1 TABLET (25 MG TOTAL) BY MOUTH IN THE MORNING. HOLD IF SYSTOLIC BLOOD PRESSURE (TOP BLOOD PRESSURE NUMBER) LESS THAN 100 MMHG OR HEART RATE LESS THAN 60 BPM (PULSE)., Disp: 90 tablet, Rfl: 3   Multiple Vitamin (MULTIVITAMIN WITH MINERALS) TABS tablet, Take 1 tablet by mouth daily. Centrum Silver for Women 50+, Disp: , Rfl:    nitroGLYCERIN (NITROSTAT) 0.4 MG SL tablet, Place 0.4 mg under the tongue every 5 (five) minutes as needed for chest pain., Disp: , Rfl:    Olopatadine HCl 0.2 % SOLN, Place 1 drop into both eyes daily. Pataday, Disp: , Rfl:    ondansetron (ZOFRAN) 4 MG tablet, Take 1 tablet (4 mg total) by mouth every 6 (six) hours as needed for nausea or vomiting., Disp: 20 tablet, Rfl: 0   OneTouch Delica Lancets 99991111 MISC, Apply topically.,  Disp: , Rfl:    oxyCODONE (OXY IR/ROXICODONE) 5 MG immediate release tablet, Take 1 tablet (5 mg total) by mouth every 6 (six) hours as needed for severe pain or moderate pain., Disp: 30 tablet, Rfl: 0   Probiotic Product (PROBIOTIC DAILY PO), Take 1 capsule by mouth in the morning., Disp: , Rfl:    rosuvastatin (CRESTOR) 20 MG tablet, Take 20 mg by mouth at  bedtime., Disp: , Rfl:    sertraline (ZOLOFT) 50 MG tablet, Take 50 mg by mouth in the morning., Disp: , Rfl:    spironolactone (ALDACTONE) 25 MG tablet, TAKE 1 TABLET BY MOUTH EVERY DAY IN THE MORNING, Disp: 90 tablet, Rfl: 0   sacubitril-valsartan (ENTRESTO) 49-51 MG, Take 1 tablet by mouth 2 (two) times daily., Disp: 180 tablet, Rfl: 1  IMPRESSION:    ICD-10-CM   1. NICM (nonischemic cardiomyopathy) ([place])  I42.8 Ambulatory referral to Cardiology    sacubitril-valsartan (ENTRESTO) 49-51 MG    2. Biventricular implantable cardioverter-defibrillator in situ  Z95.810 Ambulatory referral to Cardiology    sacubitril-valsartan (ENTRESTO) 49-51 MG    3. Chronic combined systolic and diastolic heart failure (HCC)  I50.42     4. LBBB (left bundle branch block)  I44.7     5. Essential hypertension  I10     6. Non-insulin dependent type 2 diabetes mellitus ([place])  E11.9     7. Mixed hyperlipidemia  E78.2     8. Malignant neoplasm of upper-outer quadrant of left breast in female, estrogen receptor negative ([place])  C50.412    Z17.1        RECOMMENDATIONS: LAZARA BIENVENUE is a 72 y.o. female whose past medical history and cardiovascular risk factors include: Left bundle branch block, type 2 diabetes mellitus, hyperlipidemia, nonischemic cardiomyopathy, chronic combined systolic/diastolic heart failure, recurrent breast cancer status post bilateral mastectomies (currently on chemotherapy), postmenopausal female, advanced age.  NICM (nonischemic cardiomyopathy) (HCC) Chronic combined systolic and diastolic heart failure (HCC) LBBB (left bundle  branch block). Status post BiV ICD Stage B, NYHA class II Likely secondary to initiation of chemotherapy/underlying left bundle branch block. Left heart catheterization: Normal epicardial coronaries. MUGA scan: 35.3%. Medications can set up. Entresto refilled. Given her recurrent breast cancer hem/oncology considering wanted to consider a trial of anti-HER2 therapy (Kadcyla).  Since she already has reduced LVEF my concern is worsening of cardiotoxicity.  She is already on GDMT at maximally tolerated doses.  Will refer her to advanced heart failure specialist Dr. Haroldine Laws for second opinion on whether she is a candidate for Kadcyla.  In the meantime I have asked the patient to discuss with her oncologist if there are any other chemotherapy options that could considered.   Essential hypertension Office and home blood pressures are well controlled. Medications reconciled. No changes warranted at this time  Non-insulin dependent type 2 diabetes mellitus ([place]) Most recent hemoglobin A1c reviewed. Reemphasized importance of glycemic control  Mixed hyperlipidemia Currently on atorvastatin.   She denies myalgia or other side effects. Currently managed by primary care provider.  Malignant neoplasm of upper-outer quadrant of left breast in female, estrogen receptor negative ([place]) Currently being followed by oncology.  --Continue cardiac medications as reconciled in final medication list. --Return in about 3 months (around 03/13/2023) for Follow up CMP. Or sooner if needed. --Continue follow-up with your primary care physician regarding the management of your other chronic comorbid conditions.  Patient's questions and concerns were addressed to her satisfaction. She voices understanding of the instructions provided during this encounter.   This note was created using a voice recognition software as a result there may be grammatical errors inadvertently enclosed that do not reflect the nature of  this encounter. Every attempt is made to correct such errors.   Rex Kras, [place], [facility]  Pager: [phone] Office: [phone]

## 2022-12-13 NOTE — Progress Notes (Signed)
Patient Care Team: [name], MD as PCP - General (Internal Medicine) Delsa Bern, MD as Attending Physician (Obstetrics and Gynecology) Nicholas Lose, MD as Consulting Physician (Hematology and Oncology) Stark Klein, MD as Consulting Physician (General Surgery) Evans Lance, MD as Consulting Physician (Cardiology) Mauro Kaufmann, RN as Oncology Nurse Navigator Rockwell Germany, RN as Oncology Nurse Navigator  DIAGNOSIS: No diagnosis found.  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%, PR 0%, HER-2 negative, Ki-67 31%   09/26/2005 - 11/13/2005 Radiation Therapy   Adjuvant radiation therapy   03/18/2008 Initial Biopsy   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   03/22/2008 Surgery   Right breast lumpectomy 0.9 cm DCIS ER 90% PR 98% stage 0   04/11/2008 Procedure   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   05/10/2008 - 07/02/2008 Radiation Therapy   Adjuvant radiation therapy   07/17/2008 - 04/16/2009 Anti-estrogen oral therapy   Femara then switched to tamoxifen which was discontinued in 2010 due to concern about uterine cancer   05/31/2021 Cancer Staging   Staging form: Breast, AJCC 7th Edition - Clinical: Stage IA (rT1c, N0, M0) - Signed by Nicholas Lose, MD on 05/31/2021 Stage prefix: Recurrence Laterality: Right Biopsy of metastatic site performed:  No Estrogen receptor status: Positive Progesterone receptor status: Negative HER2 status: Positive   09/12/2021 Cancer Staging   Staging form: Breast, AJCC 7th Edition - Pathologic: Stage IIA (T2, N0, cM0) - Signed by Nicholas Lose, MD on 09/12/2021 Estrogen receptor status: Positive Progesterone receptor status: Positive HER2 status: Negative   10/05/2022 Relapse/Recurrence   Palpable lump in the right mastectomy scar: 1.4 x 1.3 x 1.3 cm and a 0.4 cm mass (presence of small residual fluid): Biopsy revealed grade 3 IDC with necrosis ER 90% PR 0% Ki-67 70%, HER2 3+ positive   Breast cancer of upper-outer quadrant of right female breast ([place])  05/23/2021 Relapse/Recurrence   Screening detected right breast mass at 10 o'clock position measuring 1.2 cm, ultrasound-guided biopsy revealed grade 3 IDC ER 20%, PR 0%, HER2 positive 3+, Ki-67 50%   09/04/2021 Surgery   Bilateral mastectomies Left mastectomy: Benign Right mastectomy: Grade 3 IDC with DCIS 3.6 cm, 0/6 lymph nodes negative, ER 20%, PR 0%, HER2 positive by IHC, Ki-67 50%   11/21/2021 - 01/23/2022 Chemotherapy   Patient is on Treatment Plan : BREAST Trastuzumab q21d     04/2022 -  Anti-estrogen oral therapy   Anastrozole daily     CHIEF COMPLIANT:  Follow-up right breast cancer    INTERVAL HISTORY: Jordan Willis is a 72- year-old patient with the above mentioned   history of right breast cancer having undergone bilateral mastectomies, currently on chemotherapy with Trastzumab. She presents to the clinic today for follow-up.      ALLERGIES:  is allergic to bactrim [sulfamethoxazole-trimethoprim], aspirin, sulfa antibiotics, and tape.  MEDICATIONS:  Current Outpatient Medications  Medication Sig Dispense Refill   albuterol (VENTOLIN HFA) 108 (90 Base) MCG/ACT inhaler Inhale 2 puffs into the lungs every 6 (six) hours as needed for wheezing or shortness of breath.     b complex vitamins capsule Take 1 capsule by mouth daily.      bumetanide (BUMEX) 1 MG tablet TAKE 1 TABLET BY MOUTH EVERY DAY 90 tablet 1   cetirizine (ZYRTEC) 10 MG tablet Take 10 mg by mouth in the morning.     Cholecalciferol (VITAMIN D3) 25 MCG (1000 UT) CAPS Take 1,000 Units by mouth in the morning.     diclofenac Sodium (VOLTAREN) 1 % GEL Apply 1 Application topically 4 (four) times daily as needed (pain).     doxycycline (VIBRA-TABS) 100 MG tablet Take 1 tablet (100 mg total) by mouth 2 (two) times daily. 20 tablet 0   exemestane (AROMASIN) 25 MG tablet Take 1 tablet (25 mg total) by mouth daily after breakfast. 90 tablet 3   FARXIGA 10 MG TABS tablet TAKE 1 TABLET BY MOUTH EVERY DAY BEFORE BREAKFAST 90 tablet 0   fluticasone (FLONASE) 50 MCG/ACT nasal spray Place 1 spray into both nostrils in the morning.     GLUCOSAMINE-CHONDROITIN PO Take 1 tablet by mouth every evening.      glucose blood (ONETOUCH VERIO) test strip TEST ONCE DAILY AS DIRECTED 90     ibuprofen (ADVIL) 200 MG tablet Take 400 mg by mouth every 6 (six) hours as needed for mild pain or moderate pain.     metFORMIN (GLUCOPHAGE-XR) 500 MG 24 hr tablet Take 500 mg by mouth 2 (two) times daily.     metoprolol succinate (TOPROL-XL) 25 MG 24 hr tablet TAKE 1 TABLET (25 MG TOTAL) BY MOUTH IN THE MORNING. HOLD IF SYSTOLIC BLOOD PRESSURE (TOP BLOOD PRESSURE NUMBER) LESS THAN 100 MMHG OR HEART RATE LESS THAN 60 BPM (PULSE). 90 tablet 3   Multiple Vitamin (MULTIVITAMIN WITH MINERALS) TABS tablet Take 1 tablet by mouth daily. Centrum Silver for Women 50+     nitroGLYCERIN (NITROSTAT) 0.4 MG SL tablet Place 0.4 mg under the tongue every 5 (five) minutes as needed for chest pain.     Olopatadine HCl 0.2 % SOLN Place 1 drop into both eyes daily. Pataday     ondansetron (ZOFRAN) 4 MG tablet Take 1 tablet (4 mg total) by mouth every 6 (six) hours as needed for nausea or vomiting. 20 tablet 0   ondansetron (ZOFRAN-ODT) 4 MG disintegrating tablet Take 1 tablet (4 mg total) by mouth every 8 (eight) hours  as needed for nausea or vomiting. 20 tablet 0   OneTouch Delica Lancets [id number] MISC Apply topically.     oxyCODONE (OXY IR/ROXICODONE) 5 MG immediate release tablet Take 1 tablet (5 mg total) by mouth every 6 (six) hours as needed for severe pain or moderate pain. 30 tablet 0   Probiotic Product (PROBIOTIC DAILY PO) Take 1 capsule by mouth in the morning.     rosuvastatin (CRESTOR) 20 MG tablet Take 20 mg by mouth at bedtime.     sacubitril-valsartan (ENTRESTO) 49-51 MG Take 1 tablet by mouth 2 (two) times daily. 180 tablet 1   sertraline (ZOLOFT) 50 MG tablet Take 50 mg by mouth in the morning.     spironolactone (ALDACTONE) 25 MG tablet TAKE 1 TABLET BY MOUTH EVERY DAY IN THE MORNING 90 tablet 0   No current facility-administered medications for this visit.    PHYSICAL EXAMINATION: ECOG PERFORMANCE STATUS: {CHL ONC ECOG  [id number]  There were no vitals filed for this visit. There were no vitals filed for this visit.  BREAST:*** No palpable masses or nodules in either right or left breasts. No palpable axillary supraclavicular or infraclavicular adenopathy no breast tenderness or nipple discharge. (exam performed in the presence of a chaperone)  LABORATORY DATA:  I have reviewed the data as listed    Latest Ref Rng & Units 11/26/2022    8:21 AM 09/19/2022    8:33 AM 08/01/2022    8:42 AM  CMP  Glucose 70 - 99 mg/dL 107  101  99   BUN 8 - 23 mg/dL '8  13  11   '$ Creatinine 0.44 - 1.00 mg/dL 0.72  0.67  0.80   Sodium 135 - 145 mmol/L 137  135  139   Potassium 3.5 - 5.1 mmol/L 3.7  4.1  4.3   Chloride 98 - 111 mmol/L 102  98  101   CO2 22 - 32 mmol/L '23  24  26   '$ Calcium 8.9 - 10.3 mg/dL 9.7  10.2  9.8     Lab Results  Component Value Date   WBC 4.7 11/26/2022   HGB 12.9 11/26/2022   HCT 39.4 11/26/2022   MCV 90.0 11/26/2022   PLT 254 11/26/2022   NEUTROABS 2.4 09/19/2022    ASSESSMENT & PLAN:  No problem-specific Assessment & Plan notes found for this encounter.    No  orders of the defined types were placed in this encounter.  The patient has a good understanding of the overall plan. she agrees with it. she will call with any problems that may develop before the next visit here. Total time spent: 30 mins including face to face time and time spent for planning, charting and co-ordination of care   Suzzette Righter, [place] 12/13/22    I Gardiner Coins am acting as a Education administrator for [employer]  ***

## 2022-12-13 NOTE — Telephone Encounter (Signed)
[name], counseling intern, called patient to schedule counseling session. The patient did not answer the phone. The counselor left a message and will try again next week.   [name],  Counseling Intern  [phone] [email or web address]

## 2022-12-14 ENCOUNTER — Ambulatory Visit: Payer: 59 | Admitting: Hematology and Oncology

## 2022-12-14 ENCOUNTER — Encounter: Payer: Self-pay | Admitting: *Deleted

## 2022-12-14 ENCOUNTER — Encounter (HOSPITAL_COMMUNITY): Payer: Self-pay | Admitting: Gastroenterology

## 2022-12-14 NOTE — Progress Notes (Signed)
Attempted to obtain medical history via telephone, unable to reach at this time. HIPAA compliant voicemail message left requesting return call to pre surgical testing department. 

## 2022-12-17 ENCOUNTER — Inpatient Hospital Stay: Payer: 59 | Attending: Hematology and Oncology | Admitting: Hematology and Oncology

## 2022-12-17 ENCOUNTER — Other Ambulatory Visit: Payer: Self-pay

## 2022-12-17 VITALS — BP 131/69 | HR 95 | Temp 97.6°F | Resp 16 | Wt 176.8 lb

## 2022-12-17 DIAGNOSIS — Z171 Estrogen receptor negative status [ER-]: Secondary | ICD-10-CM | POA: Diagnosis not present

## 2022-12-17 DIAGNOSIS — C50412 Malignant neoplasm of upper-outer quadrant of left female breast: Secondary | ICD-10-CM

## 2022-12-17 DIAGNOSIS — Z1501 Genetic susceptibility to malignant neoplasm of breast: Secondary | ICD-10-CM | POA: Diagnosis not present

## 2022-12-17 DIAGNOSIS — Z79811 Long term (current) use of aromatase inhibitors: Secondary | ICD-10-CM | POA: Insufficient documentation

## 2022-12-17 DIAGNOSIS — Z803 Family history of malignant neoplasm of breast: Secondary | ICD-10-CM | POA: Insufficient documentation

## 2022-12-17 DIAGNOSIS — R5383 Other fatigue: Secondary | ICD-10-CM | POA: Diagnosis not present

## 2022-12-17 DIAGNOSIS — Z923 Personal history of irradiation: Secondary | ICD-10-CM | POA: Diagnosis not present

## 2022-12-17 DIAGNOSIS — Z1509 Genetic susceptibility to other malignant neoplasm: Secondary | ICD-10-CM | POA: Insufficient documentation

## 2022-12-17 DIAGNOSIS — Z9013 Acquired absence of bilateral breasts and nipples: Secondary | ICD-10-CM | POA: Diagnosis not present

## 2022-12-17 DIAGNOSIS — R42 Dizziness and giddiness: Secondary | ICD-10-CM | POA: Insufficient documentation

## 2022-12-17 NOTE — Assessment & Plan Note (Addendum)
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  ER 90%, PR 0%, Ki-67 70%, HER2 3+ positive Left breast mastectomy scar reexcision: Benign ------------------------------------------------------------------------------------------------------------------------------ Treatment plan: Antiestrogen therapy with exemestane. Even though the cancer risk HER2 positive given her severe cardiomyopathy we are unable to treat her with anti-HER2 therapy. Patient has a PET scan showing increased activity in the sternum with lucency and sclerosis.  However this could not be biopsied. Since she had previous bilateral breast radiation treatments there is no further role of adjuvant radiation.  However we will discuss this in tumor board.  Patient is unfortunately at high risk of local and distant recurrence because of her  inability to use anti-HER2 therapies.

## 2022-12-18 NOTE — Progress Notes (Signed)
Patient is a pleasant 72 year old female with PMH of breast cancer and bilateral breast radiation s/p reconstruction 11/2021 and removal 01/2022 with chronic left mastectomy site drainage now s/p left-sided scar revision at time of right-sided lumpectomy performed in conjunction by Dr. Lovena Le and [name] 12/04/2022 who presents to clinic for postoperative follow-up.  She was last seen here in clinic on 12/13/2022.  At that time, she was not having any issues with the left side where we did the scar revision surgery.  JP drain on that side was removed without complication or difficulty.  Decision was made not to address the right-sided lumpectomy site dressings and drain given deference to general surgery team.  Plan was for her to return in 1 week for removal of running Prolene sutures.  ABD placed over revision site in interim.  Plan for postoperative photos at subsequent encounter.  Today,

## 2022-12-19 ENCOUNTER — Encounter: Payer: Self-pay | Admitting: *Deleted

## 2022-12-19 ENCOUNTER — Encounter: Payer: 59 | Admitting: Physician Assistant

## 2022-12-20 ENCOUNTER — Ambulatory Visit (INDEPENDENT_AMBULATORY_CARE_PROVIDER_SITE_OTHER): Payer: 59 | Admitting: Physician Assistant

## 2022-12-20 DIAGNOSIS — E1165 Type 2 diabetes mellitus with hyperglycemia: Secondary | ICD-10-CM | POA: Diagnosis not present

## 2022-12-20 DIAGNOSIS — E7800 Pure hypercholesterolemia, unspecified: Secondary | ICD-10-CM | POA: Diagnosis not present

## 2022-12-20 DIAGNOSIS — I10 Essential (primary) hypertension: Secondary | ICD-10-CM | POA: Diagnosis not present

## 2022-12-20 DIAGNOSIS — Z9889 Other specified postprocedural states: Secondary | ICD-10-CM

## 2022-12-20 NOTE — Anesthesia Preprocedure Evaluation (Addendum)
Anesthesia Evaluation  Patient identified by MRN, date of birth, ID band Patient awake    Reviewed: Allergy & Precautions, NPO status , Patient's Chart, lab work & pertinent test results  History of Anesthesia Complications (+) PONV and history of anesthetic complications  Airway Mallampati: II  TM Distance: >3 FB Neck ROM: Full    Dental no notable dental hx. (+) Dental Advisory Given   Pulmonary sleep apnea    Pulmonary exam normal        Cardiovascular hypertension, Pt. on home beta blockers and Pt. on medications + angina  +CHF  Normal cardiovascular exam+ Cardiac Defibrillator   Follows with cardiology for history of left bundle branch block and nonischemic cardiomyopathy s/p Abbott CRT-D PPM implant 08/24/2022  TTE 10/29/2022:  1. Left ventricular ejection fraction, by estimation, is 30 to 35%. The  left ventricle has moderately decreased function. The left ventricle  demonstrates regional wall motion abnormalities. Abnormal (paradoxical)  septal motion, consistent with left  bundle branch block      There is mild asymmetric left ventricular hypertrophy of the  basal-septal segment. Left ventricular diastolic parameters are consistent  with Grade I diastolic dysfunction (impaired relaxation).   2. Right ventricular systolic function is low normal. The right  ventricular size is normal. There is normal pulmonary artery systolic  pressure. The estimated right ventricular systolic pressure is [id number] mmHg.   3. The mitral valve is normal in structure. Trivial mitral valve  regurgitation. No evidence of mitral stenosis.   4. Pulmonic valve regurgitation is moderate.   5. The aortic valve is tricuspid. There is mild calcification of the  aortic valve. Aortic valve regurgitation is not visualized. Aortic valve  sclerosis/calcification is present, without any evidence of aortic  stenosis.   6. The inferior vena cava is normal in  size with greater than 50%  respiratory variability, suggesting right atrial pressure of 3 mmHg.    Left Heart Catheterization 03/27/22:  LV: 118/3, EDP 11 mmHg.  Ao 128/56, mean 84 mmHg.  No pressure gradient across the aortic valve. LVEF 30 to 35% with global hypokinesis.  LV is mildly dilated. LM: Large caliber vessels, smooth and normal. LAD: Large vessel, gives origin to several small diagonals.  Smooth and normal.  Mid to distal segment is tortuous and may suggest hypertensive heart disease. RI: Large vessel, gives origin to large lateral branches.  Smooth and normal. CX: Large vessel, gives origin to moderate-sized OM1, 2 and continues as OM 3.  Smooth and normal. RCA: Dominant, continues as a PDA.  Smooth and normal.  Impression: Findings consistent with nonischemic dilated cardiomyopathy.  Severe LV systolic dysfunction.    Neuro/Psych  Headaches PSYCHIATRIC DISORDERS Anxiety        GI/Hepatic Neg liver ROS,GERD  ,,  Endo/Other  diabetes, Type 2, Oral Hypoglycemic Agents    Renal/GU negative Renal ROS  negative genitourinary   Musculoskeletal  (+) Arthritis ,    Abdominal   Peds  Hematology negative hematology ROS (+)   Anesthesia Other Findings   Reproductive/Obstetrics                              Anesthesia Physical Anesthesia Plan  ASA: 3  Anesthesia Plan: MAC   Post-op Pain Management: Minimal or no pain anticipated   Induction:   PONV Risk Score and Plan: 3 and Dexamethasone, Ondansetron, Treatment may vary due to age or medical condition and Propofol infusion  Airway  Management Planned: Natural Airway  Additional Equipment:   Intra-op Plan:   Post-operative Plan:   Informed Consent: I have reviewed the patients History and Physical, chart, labs and discussed the procedure including the risks, benefits and alternatives for the proposed anesthesia with the patient or authorized representative who has indicated his/her  understanding and acceptance.     Dental advisory given  Plan Discussed with: Anesthesiologist and CRNA  Anesthesia Plan Comments:          Anesthesia Quick Evaluation

## 2022-12-21 ENCOUNTER — Other Ambulatory Visit: Payer: Self-pay

## 2022-12-21 ENCOUNTER — Encounter (HOSPITAL_COMMUNITY)
Admission: RE | Disposition: A | Discharge status: Home / Self Care | Payer: Self-pay | Source: Home / Self Care | Attending: Gastroenterology

## 2022-12-21 ENCOUNTER — Ambulatory Visit (HOSPITAL_BASED_OUTPATIENT_CLINIC_OR_DEPARTMENT_OTHER): Payer: 59 | Admitting: Anesthesiology

## 2022-12-21 ENCOUNTER — Ambulatory Visit (HOSPITAL_COMMUNITY)
Admission: RE | Admit: 2022-12-21 | Discharge: 2022-12-21 | Disposition: A | Discharge status: Home / Self Care | Payer: 59 | Attending: Gastroenterology | Admitting: Gastroenterology

## 2022-12-21 ENCOUNTER — Ambulatory Visit (HOSPITAL_COMMUNITY): Payer: 59 | Admitting: Anesthesiology

## 2022-12-21 ENCOUNTER — Encounter (HOSPITAL_COMMUNITY): Payer: Self-pay | Admitting: Gastroenterology

## 2022-12-21 DIAGNOSIS — Z7984 Long term (current) use of oral hypoglycemic drugs: Secondary | ICD-10-CM | POA: Diagnosis not present

## 2022-12-21 DIAGNOSIS — I509 Heart failure, unspecified: Secondary | ICD-10-CM | POA: Insufficient documentation

## 2022-12-21 DIAGNOSIS — I110 Hypertensive heart disease with heart failure: Secondary | ICD-10-CM

## 2022-12-21 DIAGNOSIS — K219 Gastro-esophageal reflux disease without esophagitis: Secondary | ICD-10-CM | POA: Diagnosis not present

## 2022-12-21 DIAGNOSIS — Z95810 Presence of automatic (implantable) cardiac defibrillator: Secondary | ICD-10-CM | POA: Insufficient documentation

## 2022-12-21 DIAGNOSIS — Z8249 Family history of ischemic heart disease and other diseases of the circulatory system: Secondary | ICD-10-CM | POA: Insufficient documentation

## 2022-12-21 DIAGNOSIS — E119 Type 2 diabetes mellitus without complications: Secondary | ICD-10-CM

## 2022-12-21 DIAGNOSIS — Z86010 Personal history of colonic polyps: Secondary | ICD-10-CM

## 2022-12-21 DIAGNOSIS — Z1211 Encounter for screening for malignant neoplasm of colon: Secondary | ICD-10-CM | POA: Diagnosis not present

## 2022-12-21 DIAGNOSIS — M1990 Unspecified osteoarthritis, unspecified site: Secondary | ICD-10-CM | POA: Insufficient documentation

## 2022-12-21 DIAGNOSIS — I209 Angina pectoris, unspecified: Secondary | ICD-10-CM

## 2022-12-21 DIAGNOSIS — Z833 Family history of diabetes mellitus: Secondary | ICD-10-CM | POA: Insufficient documentation

## 2022-12-21 DIAGNOSIS — G4730 Sleep apnea, unspecified: Secondary | ICD-10-CM | POA: Insufficient documentation

## 2022-12-21 DIAGNOSIS — Z79899 Other long term (current) drug therapy: Secondary | ICD-10-CM | POA: Insufficient documentation

## 2022-12-21 DIAGNOSIS — I428 Other cardiomyopathies: Secondary | ICD-10-CM | POA: Insufficient documentation

## 2022-12-21 DIAGNOSIS — I447 Left bundle-branch block, unspecified: Secondary | ICD-10-CM | POA: Diagnosis not present

## 2022-12-21 HISTORY — PX: COLONOSCOPY WITH PROPOFOL: SHX5780

## 2022-12-21 LAB — GLUCOSE, CAPILLARY
Glucose-Capillary: 110 mg/dL — ABNORMAL HIGH (ref 70–99)
Glucose-Capillary: 100 mg/dL — ABNORMAL HIGH (ref 70–99)

## 2022-12-21 SURGERY — COLONOSCOPY WITH PROPOFOL
Anesthesia: Monitor Anesthesia Care

## 2022-12-21 MED ORDER — LACTATED RINGERS IV SOLN: INTRAVENOUS | Status: DC | PRN

## 2022-12-21 MED ORDER — PROPOFOL 500 MG/50ML IV EMUL
INTRAVENOUS | Status: DC | PRN
  Administered 2022-12-21: 125 ug/kg/min via INTRAVENOUS

## 2022-12-21 MED ORDER — PROPOFOL 10 MG/ML IV BOLUS
INTRAVENOUS | Status: DC | PRN
  Administered 2022-12-21 (×2): 30 mg via INTRAVENOUS

## 2022-12-21 MED ORDER — SODIUM CHLORIDE 0.9 % IV SOLN: INTRAVENOUS | Status: DC

## 2022-12-21 MED ORDER — PROPOFOL 1000 MG/100ML IV EMUL
INTRAVENOUS | Status: AC
  Filled 2022-12-21: qty 100

## 2022-12-21 MED ORDER — LIDOCAINE 2% (20 MG/ML) 5 ML SYRINGE
INTRAMUSCULAR | Status: DC | PRN
  Administered 2022-12-21: 4 mg via INTRAVENOUS

## 2022-12-21 SURGICAL SUPPLY — 22 items

## 2022-12-21 NOTE — H&P (Signed)
Jordan Willis HPI: This 72 year old black female presents to the office for colorectal cancer screening. She is accompanied by her daughter and son today. She has 1-2 BM's per day with no obvious blood or mucus in the stool. She take Omeprazole 20 mg as needed for acid reflux. She has a good appetite and her weight has been stable. She denies having any complaints of abdominal pain, nausea, vomiting, dysphagia or odynophagia. She denies having a family history of colon cancer, celiac sprue or IBD. Her last colonoscopy was done on 06/26/2019 when fragments of tubulovillous adenomas were removed from the cecum and tubular adenomas were removed from the rectosigmoid colon.   Past Medical History:  Diagnosis Date   AICD (automatic cardioverter/defibrillator) present    Anemia    Anginal pain (HCC)    Anxiety    Arthritis    Lumbar spine DDD.   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   Diabetes mellitus without complication (HCC)    Type II   Dyspnea    When patient gets sick uses Pro-Air inhaler   Frequent sinus infections    GERD (gastroesophageal reflux disease)    occ   Goiter    History of cardiomegaly    History of cataract    Bilateral   History of kidney stones    08/28/2018 currently has a large kidney stone, asymptomatic at this time   History of migraine    History of uterine fibroid    History of uterine prolapse    Hyperlipidemia    Hypertension    LBBB (left bundle branch block)    Menopause 03/03/2012   Nasal turbinate hypertrophy 11/29/2016   Left inferior    Personal history of chemotherapy    Personal history of radiation therapy    Pneumonia    as a child   PONV (postoperative nausea and vomiting)    Sinusitis 2014   Sleep apnea    No CPAP   SUI (stress urinary incontinence, female)     SVD (spontaneous vaginal delivery)    x 2   Thyroid nodule     Past Surgical History:  Procedure Laterality Date   ABDOMINAL HYSTERECTOMY     BILATERAL SALPINGOOPHORECTOMY Bilateral    BIV ICD INSERTION CRT-D N/A 08/24/2022   Procedure: BIV ICD INSERTION CRT-D;  Surgeon: Evans Lance, MD;  Location: [facility];  Service: Cardiovascular;  Laterality: N/A;   BLADDER SUSPENSION N/A 05/16/2017   Procedure: TRANSVAGINAL TAPE (TVT) PROCEDURE;  Surgeon: Everett Graff, MD;  Location: [facility];  Service: Gynecology;  Laterality: N/A;   BREAST BIOPSY Right 2009   BREAST IMPLANT REMOVAL Bilateral 02/05/2022   Procedure: Removal bilateral breast implants;  Surgeon: [name], MD;  Location: [facility];  Service: Plastics;  Laterality: Bilateral;   BREAST LUMPECTOMY Right 2009   BREAST LUMPECTOMY Left 2006   BREAST RECONSTRUCTION WITH PLACEMENT OF TISSUE EXPANDER AND FLEX HD (ACELLULAR HYDRATED DERMIS) Bilateral 09/04/2021   Procedure: BILATERAL BREAST RECONSTRUCTION WITH PLACEMENT OF TISSUE EXPANDER AND FLEX HD (ACELLULAR HYDRATED DERMIS);  Surgeon: [name], MD;  Location: [facility];  Service: Plastics;  Laterality: Bilateral;   CARDIAC CATHETERIZATION     CATARACT EXTRACTION Left 10/23/2017   CATARACT EXTRACTION Right 11/06/2017   COLONOSCOPY  10/22/2009   Normal.  Repeat 5 years.  [name]   CYSTOSCOPY N/A 05/16/2017   Procedure: CYSTOSCOPY;  Surgeon: Everett Graff, MD;  Location: [facility];  Service: Gynecology;  Laterality: N/A;   DEBRIDEMENT AND CLOSURE WOUND Bilateral 10/03/2021   Procedure: Debridement bilateral mastectomy flaps;  Surgeon: [name], MD;  Location: [facility];  Service: Plastics;  Laterality: Bilateral;  1.5 hour   EYE SURGERY     bilateral cataracts   LEFT HEART CATH AND CORONARY ANGIOGRAPHY N/A 02/09/2020   Procedure: LEFT HEART CATH AND CORONARY ANGIOGRAPHY;  Surgeon: Nigel Mormon, MD;  Location: [facility];  Service: Cardiovascular;   Laterality: N/A;   LEFT HEART CATH AND CORONARY ANGIOGRAPHY N/A 03/27/2022   Procedure: LEFT HEART CATH AND CORONARY ANGIOGRAPHY;  Surgeon: Adrian Prows, MD;  Location: [facility];  Service: Cardiovascular;  Laterality: N/A;   MASS EXCISION Right 12/04/2022   Procedure: EXCISION OF RIGHT CHEST WALL MASS;  Surgeon: Stark Klein, MD;  Location: [facility];  Service: General;  Laterality: Right;   MASTECTOMY W/ SENTINEL NODE BIOPSY Right 09/04/2021   Procedure: BILATERAL MASTECTOMIES WITH RIGHT SENTINEL LYMPH NODE BIOPSY;  Surgeon: Stark Klein, MD;  Location: [facility];  Service: General;  Laterality: Right;   REMOVAL OF BILATERAL TISSUE EXPANDERS WITH PLACEMENT OF BILATERAL BREAST IMPLANTS Bilateral 10/03/2021   Procedure: REMOVAL OF BILATERAL TISSUE EXPANDERS WITH REPLACEMENT OF TISSUE EXPANDERS;  Surgeon: [name], MD;  Location: [facility];  Service: Plastics;  Laterality: Bilateral;   REMOVAL OF BILATERAL TISSUE EXPANDERS WITH PLACEMENT OF BILATERAL BREAST IMPLANTS Bilateral 12/19/2021   Procedure: REMOVAL OF BILATERAL TISSUE EXPANDERS WITH PLACEMENT OF BILATERAL BREAST IMPLANTS;  Surgeon: [name], MD;  Location: [facility];  Service: Plastics;  Laterality: Bilateral;   ROBOTIC ASSISTED TOTAL HYSTERECTOMY N/A 05/16/2017   Procedure: ROBOTIC ASSISTED TOTAL HYSTERECTOMY;  Surgeon: Delsa Bern, MD;  Location: [facility];  Service: Gynecology;  Laterality: N/A;   ROTATOR CUFF REPAIR Bilateral    SCAR REVISION N/A 12/04/2022   Procedure: REVISION OF MASTECTOMY SCAR;  Surgeon: Stark Klein, MD;  Location: [facility];  Service: General;  Laterality: N/A;   SHOULDER ARTHROSCOPY WITH ROTATOR CUFF REPAIR AND SUBACROMIAL DECOMPRESSION Right 09/03/2018   Procedure: Right shoulder manipulation under anesthesia, exam under anesthesia, mini open rotator cuff repair, subacromial decompression;  Surgeon: Susa Day, MD;  Location: [facility];  Service: Orthopedics;  Laterality: Right;  90 mins   TUBAL LIGATION      WISDOM TOOTH EXTRACTION      Family History  Problem Relation Age of Onset   Cancer Mother 53       breast cancer   Hypertension Mother    Diabetes Father    Cancer Sister        breast cancer   Breast cancer Sister    Cancer Sister        breast cancer   Breast cancer Sister     Social History:  reports that she has never smoked. She has never used smokeless tobacco. She reports that she does not drink alcohol and does not use drugs.  Allergies:  Allergies  Allergen Reactions   Bactrim [Sulfamethoxazole-Trimethoprim] Shortness Of Breath    Shortness of breath, difficulty breathing, headache, rash, fatigue   Aspirin Palpitations and Other (See Comments)    Heart fluttering, pt takes 40.5 mg (half of 81 mg) daily Other reaction(s): bradycardia, Not available   Sulfa Antibiotics Hives   Tape Rash  Tears skin off    Medications: Scheduled: Continuous:  sodium chloride      No results found for this or any previous visit (from the past 24 hour(s)).   No results found.  ROS:  As stated above in the HPI otherwise negative.  There were no vitals taken for this visit.    PE: Gen: NAD, Alert and Oriented HEENT:  [place]/AT, EOMI Neck: Supple, no LAD Lungs: CTA Bilaterally CV: RRR without M/G/R ABD: Soft, NTND, +BS Ext: No C/C/E  Assessment/Plan: 1) Personal history of polyps - colonoscopy.  Morning Halberg D 12/21/2022, 9:14 AM

## 2022-12-21 NOTE — Transfer of Care (Signed)
Immediate Anesthesia Transfer of Care Note  Patient: Jordan Willis  Procedure(s) Performed: COLONOSCOPY WITH PROPOFOL  Patient Location: Endoscopy Unit  Anesthesia Type:MAC  Level of Consciousness: oriented, sedated, and patient cooperative  Airway & Oxygen Therapy: Patient Spontanous Breathing and Patient connected to nasal cannula oxygen  Post-op Assessment: Report given to RN and Post -op Vital signs reviewed and stable  Post vital signs: Reviewed  Last Vitals:  Vitals Value Taken Time  BP    Temp    Pulse    Resp    SpO2      Last Pain:  Vitals:   12/21/22 0915  TempSrc: Oral  PainSc: 7          Complications: No notable events documented.

## 2022-12-21 NOTE — Anesthesia Postprocedure Evaluation (Signed)
Anesthesia Post Note  Patient: Jordan Willis  Procedure(s) Performed: COLONOSCOPY WITH PROPOFOL     Patient location during evaluation: Endoscopy Anesthesia Type: MAC Level of consciousness: awake and alert Pain management: pain level controlled Vital Signs Assessment: post-procedure vital signs reviewed and stable Respiratory status: spontaneous breathing and respiratory function stable Cardiovascular status: stable Postop Assessment: no apparent nausea or vomiting Anesthetic complications: no   No notable events documented.  Last Vitals:  Vitals:   12/21/22 1014 12/21/22 1025  BP: 102/66 (!) 114/56  Pulse: 63 67  Resp: 18 (!) 21  Temp:    SpO2: 100% 97%    Last Pain:  Vitals:   12/21/22 1025  TempSrc:   PainSc: 0-No pain                 Oletta Buehring DANIEL

## 2022-12-21 NOTE — Op Note (Signed)
[facility] Patient Name: Jordan Willis Procedure Date: 12/21/2022 MRN: [id number] Attending MD: Carol Ada , MD, [id number] Date of Birth: August 19, 1951 CSN: [id number] Age: 72 Admit Type: Outpatient Procedure:                Colonoscopy Indications:              High risk colon cancer surveillance: Personal                            history of colonic polyps Providers:                Carol Ada, MD, Jamison Neighbor RN, RN, Brien Mates, Technician Referring MD:              Medicines:                 Complications:            No immediate complications. Estimated Blood Loss:     Estimated blood loss: none. Procedure:                Pre-Anesthesia Assessment:                           - Prior to the procedure, a History and Physical                            was performed, and patient medications and                            allergies were reviewed. The patient's tolerance of                            previous anesthesia was also reviewed. The risks                            and benefits of the procedure and the sedation                            options and risks were discussed with the patient.                            All questions were answered, and informed consent                            was obtained. Prior Anticoagulants: The patient has                            taken no anticoagulant or antiplatelet agents. ASA                            Grade Assessment: III - A patient with severe                            systemic disease.  After reviewing the risks and                            benefits, the patient was deemed in satisfactory                            condition to undergo the procedure.                           - Sedation was administered by an anesthesia                            professional. Deep sedation was attained.                           After obtaining informed consent, the colonoscope                             was passed under direct vision. Throughout the                            procedure, the patient's blood pressure, pulse, and                            oxygen saturations were monitored continuously. The                            [id number]) Olympus colonoscope was                            introduced through the anus and advanced to the the                            cecum, identified by appendiceal orifice and                            ileocecal valve. The colonoscopy was performed                            without difficulty. The patient tolerated the                            procedure well. The quality of the bowel                            preparation was evaluated using the BBPS [facility]                            Bowel Preparation Scale) with scores of: Right                            Colon = 3 (entire mucosa seen well with no residual  staining, small fragments of stool or opaque                            liquid), Transverse Colon = 3 (entire mucosa seen                            well with no residual staining, small fragments of                            stool or opaque liquid) and Left Colon = 3 (entire                            mucosa seen well with no residual staining, small                            fragments of stool or opaque liquid). The total                            BBPS score equals 9. The quality of the bowel                            preparation was good. The ileocecal valve,                            appendiceal orifice, and rectum were photographed. Scope In: 9:45:54 AM Scope Out: 9:58:07 AM Scope Withdrawal Time: 0 hours 8 minutes 22 seconds  Total Procedure Duration: 0 hours 12 minutes 13 seconds  Findings:      The entire examined colon appeared normal. Impression:               - The entire examined colon is normal.                           - No specimens collected. Moderate Sedation:      Not  Applicable - Patient had care per Anesthesia. Recommendation:           - Patient has a contact number available for                            emergencies. The signs and symptoms of potential                            delayed complications were discussed with the                            patient. Return to normal activities tomorrow.                            Written discharge instructions were provided to the                            patient.                           -  Resume previous diet.                           - Continue present medications.                           - Repeat colonoscopy in 5 years for surveillance,                            if clinically appropriate. 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 Procedure Code(s):        --- Professional ---                           [phone], Colonoscopy, flexible; diagnostic, including                            collection of specimen(s) by brushing or washing,                            when performed (separate procedure) Diagnosis Code(s):        --- Professional ---                           Z86.010, Personal history of colonic polyps CPT copyright 2022 American Medical Association. All rights reserved. The codes documented in this report are preliminary and upon coder review may  be revised to meet current compliance requirements. Carol Ada, MD Carol Ada, MD 12/21/2022 10:03:41 AM This report has been signed electronically. Number of Addenda: 0

## 2022-12-21 NOTE — Anesthesia Procedure Notes (Signed)
Procedure Name: MAC Date/Time: 12/21/2022 9:35 AM  Performed by: Jenne Campus, CRNAPre-anesthesia Checklist: Patient identified, Emergency Drugs available, Suction available and Patient being monitored Oxygen Delivery Method: Nasal cannula

## 2022-12-24 ENCOUNTER — Encounter (HOSPITAL_COMMUNITY): Payer: Self-pay | Admitting: Gastroenterology

## 2022-12-25 ENCOUNTER — Ambulatory Visit: Payer: 59 | Attending: Cardiology | Admitting: Internal Medicine

## 2022-12-25 ENCOUNTER — Encounter: Payer: Self-pay | Admitting: Internal Medicine

## 2022-12-25 VITALS — BP 124/72 | HR 77 | Ht 67.5 in | Wt 179.4 lb

## 2022-12-25 DIAGNOSIS — Z95810 Presence of automatic (implantable) cardiac defibrillator: Secondary | ICD-10-CM | POA: Insufficient documentation

## 2022-12-25 DIAGNOSIS — I428 Other cardiomyopathies: Secondary | ICD-10-CM | POA: Diagnosis not present

## 2022-12-25 DIAGNOSIS — I447 Left bundle-branch block, unspecified: Secondary | ICD-10-CM

## 2022-12-25 DIAGNOSIS — I5032 Chronic diastolic (congestive) heart failure: Secondary | ICD-10-CM

## 2022-12-25 NOTE — Patient Instructions (Addendum)
Medication Instructions:  Your physician recommends that you continue on your current medications as directed. Please refer to the Current Medication list given to you today.  *If you need a refill on your cardiac medications before your next appointment, please call your pharmacy*  Lab Work: None ordered.  If you have labs (blood work) drawn today and your tests are completely normal, you will receive your results only by: [place] (if you have MyChart) OR A paper copy in the mail If you have any lab test that is abnormal or we need to change your treatment, we will call you to review the results.  Testing/Procedures: None ordered.  Follow-Up: At [facility], you and your health needs are our priority.  As part of our continuing mission to provide you with exceptional heart care, we have created designated Provider Care Teams.  These Care Teams include your primary Cardiologist (physician) and Advanced Practice Providers (APPs -  Physician Assistants and Nurse Practitioners) who all work together to provide you with the care you need, when you need it.  We recommend signing up for the patient portal called "MyChart".  Sign up information is provided on this After Visit Summary.  MyChart is used to connect with patients for Virtual Visits (Telemedicine).  Patients are able to view lab/test results, encounter notes, upcoming appointments, etc.  Non-urgent messages can be sent to your provider as well.   To learn more about what you can do with MyChart, go to [email or web address].    Your next appointment:   Please schedule a 3 month follow up appointment with Dr. Cristopher Peru   The format for your next appointment:   In Person  Provider:   Cristopher Peru, MD{or one of the following Advanced Practice Providers on your designated Care Team:   Tommye Standard, [place] Legrand Como "Jonni Sanger" Chalmers Cater, [place]  Remote monitoring is used to monitor your ICD from home. This monitoring reduces the  number of office visits required to check your device to one time per year. It allows [place] to keep an eye on the functioning of your device to ensure it is working properly. You are scheduled for a device check from home on 02/26/2023. You may send your transmission at any time that day. If you have a wireless device, the transmission will be sent automatically. After your physician reviews your transmission, you will receive a postcard with your next transmission date.

## 2022-12-25 NOTE — Progress Notes (Signed)
HPI Mrs. Terrazas returns today for followup. The patient has a h/o chronic systolic heart failure, LBBB, breast CA, and a non-ischemic CM. She underwent Biv ICD insertion about 5 months ago. In the interim, she notes fatigue. She underwent mastectomy a few weeks ago.  Allergies  Allergen Reactions   Bactrim [Sulfamethoxazole-Trimethoprim] Shortness Of Breath    Shortness of breath, difficulty breathing, headache, rash, fatigue   Aspirin Palpitations and Other (See Comments)    Heart fluttering, pt takes 40.5 mg (half of 81 mg) daily Other reaction(s): bradycardia, Not available   Sulfa Antibiotics Hives   Tape Rash    Tears skin off     Current Outpatient Medications  Medication Sig Dispense Refill   albuterol (VENTOLIN HFA) 108 (90 Base) MCG/ACT inhaler Inhale 2 puffs into the lungs every 6 (six) hours as needed for wheezing or shortness of breath.     b complex vitamins capsule Take 1 capsule by mouth daily.     baclofen (LIORESAL) 10 MG tablet Take 10 mg by mouth 3 (three) times daily.     bumetanide (BUMEX) 1 MG tablet TAKE 1 TABLET BY MOUTH EVERY DAY 90 tablet 1   cetirizine (ZYRTEC) 10 MG tablet Take 10 mg by mouth in the morning.     Cholecalciferol (VITAMIN D3) 25 MCG (1000 UT) CAPS Take 1,000 Units by mouth in the morning.     diclofenac Sodium (VOLTAREN) 1 % GEL Apply 1 Application topically 4 (four) times daily as needed (pain).     exemestane (AROMASIN) 25 MG tablet Take 1 tablet (25 mg total) by mouth daily after breakfast. 90 tablet 3   FARXIGA 10 MG TABS tablet TAKE 1 TABLET BY MOUTH EVERY DAY BEFORE BREAKFAST 90 tablet 0   fluticasone (FLONASE) 50 MCG/ACT nasal spray Place 1 spray into both nostrils in the morning.     GLUCOSAMINE-CHONDROITIN PO Take 1 tablet by mouth every evening.      glucose blood (ONETOUCH VERIO) test strip TEST ONCE DAILY AS DIRECTED 90     ibuprofen (ADVIL) 200 MG tablet Take 400 mg by mouth every 6 (six) hours as needed for mild pain or  moderate pain.     metFORMIN (GLUCOPHAGE-XR) 500 MG 24 hr tablet Take 500 mg by mouth 2 (two) times daily.     metoprolol succinate (TOPROL-XL) 25 MG 24 hr tablet TAKE 1 TABLET (25 MG TOTAL) BY MOUTH IN THE MORNING. HOLD IF SYSTOLIC BLOOD PRESSURE (TOP BLOOD PRESSURE NUMBER) LESS THAN 100 MMHG OR HEART RATE LESS THAN 60 BPM (PULSE). 90 tablet 3   Multiple Vitamin (MULTIVITAMIN WITH MINERALS) TABS tablet Take 1 tablet by mouth daily. Centrum Silver for Women 50+     nitroGLYCERIN (NITROSTAT) 0.4 MG SL tablet Place 0.4 mg under the tongue every 5 (five) minutes as needed for chest pain.     Olopatadine HCl 0.2 % SOLN Place 1 drop into both eyes daily. Pataday     ondansetron (ZOFRAN) 4 MG tablet Take 1 tablet (4 mg total) by mouth every 6 (six) hours as needed for nausea or vomiting. 20 tablet 0   OneTouch Delica Lancets [id number] MISC Apply topically.     oxyCODONE (OXY IR/ROXICODONE) 5 MG immediate release tablet Take 1 tablet (5 mg total) by mouth every 6 (six) hours as needed for severe pain or moderate pain. 30 tablet 0   Probiotic Product (PROBIOTIC DAILY PO) Take 1 capsule by mouth in the morning.     rosuvastatin (  CRESTOR) 20 MG tablet Take 20 mg by mouth at bedtime.     sacubitril-valsartan (ENTRESTO) 49-51 MG Take 1 tablet by mouth 2 (two) times daily. 180 tablet 1   sertraline (ZOLOFT) 50 MG tablet Take 50 mg by mouth in the morning.     spironolactone (ALDACTONE) 25 MG tablet TAKE 1 TABLET BY MOUTH EVERY DAY IN THE MORNING 90 tablet 0   No current facility-administered medications for this visit.     Past Medical History:  Diagnosis Date   AICD (automatic cardioverter/defibrillator) present    Anemia    Anginal pain (HCC)    Anxiety    Arthritis    Lumbar spine DDD.   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   Diabetes mellitus without complication (HCC)    Type II   Dyspnea    When patient gets sick uses Pro-Air inhaler   Frequent sinus infections    GERD (gastroesophageal reflux disease)    occ   Goiter    History of cardiomegaly    History of cataract    Bilateral   History of kidney stones    08/28/2018 currently has a large kidney stone, asymptomatic at this time   History of migraine    History of uterine fibroid    History of uterine prolapse    Hyperlipidemia    Hypertension    LBBB (left bundle branch block)    Menopause 03/03/2012   Nasal turbinate hypertrophy 11/29/2016   Left inferior    Personal history of chemotherapy    Personal history of radiation therapy    Pneumonia    as a child   PONV (postoperative nausea and vomiting)    Sinusitis 2014   Sleep apnea    No CPAP   SUI (stress urinary incontinence, female)    SVD (spontaneous vaginal delivery)    x 2   Thyroid nodule     ROS:   All systems reviewed and negative except as noted in the HPI.   Past Surgical History:  Procedure Laterality Date   ABDOMINAL HYSTERECTOMY     BILATERAL SALPINGOOPHORECTOMY Bilateral    BIV ICD INSERTION CRT-D N/A 08/24/2022   Procedure: BIV ICD INSERTION CRT-D;  Surgeon: Evans Lance, MD;  Location: [facility];  Service: Cardiovascular;  Laterality: N/A;   BLADDER SUSPENSION N/A 05/16/2017   Procedure: TRANSVAGINAL TAPE (TVT) PROCEDURE;  Surgeon: Everett Graff, MD;  Location: [facility];  Service: Gynecology;  Laterality: N/A;   BREAST BIOPSY Right 2009   BREAST IMPLANT REMOVAL Bilateral 02/05/2022   Procedure: Removal bilateral breast implants;  Surgeon: [name], MD;  Location: [place];  Service: Plastics;  Laterality: Bilateral;   BREAST LUMPECTOMY Right 2009   BREAST LUMPECTOMY Left 2006   BREAST RECONSTRUCTION WITH PLACEMENT OF TISSUE EXPANDER AND FLEX HD (ACELLULAR HYDRATED DERMIS) Bilateral 09/04/2021   Procedure: BILATERAL BREAST  RECONSTRUCTION WITH PLACEMENT OF TISSUE EXPANDER AND FLEX HD (ACELLULAR HYDRATED DERMIS);  Surgeon: [name], MD;  Location: [place];  Service: Plastics;  Laterality: Bilateral;   CARDIAC CATHETERIZATION     CATARACT EXTRACTION Left 10/23/2017   CATARACT EXTRACTION Right 11/06/2017   COLONOSCOPY  10/22/2009   Normal.  Repeat 5 years.  [name]   COLONOSCOPY WITH PROPOFOL N/A 12/21/2022  Procedure: COLONOSCOPY WITH PROPOFOL;  Surgeon: Carol Ada, MD;  Location: [facility];  Service: Gastroenterology;  Laterality: N/A;   CYSTOSCOPY N/A 05/16/2017   Procedure: CYSTOSCOPY;  Surgeon: Everett Graff, MD;  Location: [facility];  Service: Gynecology;  Laterality: N/A;   DEBRIDEMENT AND CLOSURE WOUND Bilateral 10/03/2021   Procedure: Debridement bilateral mastectomy flaps;  Surgeon: [name], MD;  Location: [facility];  Service: Plastics;  Laterality: Bilateral;  1.5 hour   EYE SURGERY     bilateral cataracts   LEFT HEART CATH AND CORONARY ANGIOGRAPHY N/A 02/09/2020   Procedure: LEFT HEART CATH AND CORONARY ANGIOGRAPHY;  Surgeon: Nigel Mormon, MD;  Location: [facility];  Service: Cardiovascular;  Laterality: N/A;   LEFT HEART CATH AND CORONARY ANGIOGRAPHY N/A 03/27/2022   Procedure: LEFT HEART CATH AND CORONARY ANGIOGRAPHY;  Surgeon: Adrian Prows, MD;  Location: [facility];  Service: Cardiovascular;  Laterality: N/A;   MASS EXCISION Right 12/04/2022   Procedure: EXCISION OF RIGHT CHEST WALL MASS;  Surgeon: Stark Klein, MD;  Location: [place];  Service: General;  Laterality: Right;   MASTECTOMY W/ SENTINEL NODE BIOPSY Right 09/04/2021   Procedure: BILATERAL MASTECTOMIES WITH RIGHT SENTINEL LYMPH NODE BIOPSY;  Surgeon: Stark Klein, MD;  Location: [place];  Service: General;  Laterality: Right;   REMOVAL OF BILATERAL TISSUE EXPANDERS WITH PLACEMENT OF BILATERAL BREAST IMPLANTS Bilateral 10/03/2021   Procedure: REMOVAL OF BILATERAL TISSUE EXPANDERS WITH REPLACEMENT OF TISSUE  EXPANDERS;  Surgeon: [name], MD;  Location: [place];  Service: Plastics;  Laterality: Bilateral;   REMOVAL OF BILATERAL TISSUE EXPANDERS WITH PLACEMENT OF BILATERAL BREAST IMPLANTS Bilateral 12/19/2021   Procedure: REMOVAL OF BILATERAL TISSUE EXPANDERS WITH PLACEMENT OF BILATERAL BREAST IMPLANTS;  Surgeon: [name], MD;  Location: [place];  Service: Plastics;  Laterality: Bilateral;   ROBOTIC ASSISTED TOTAL HYSTERECTOMY N/A 05/16/2017   Procedure: ROBOTIC ASSISTED TOTAL HYSTERECTOMY;  Surgeon: Delsa Bern, MD;  Location: [facility];  Service: Gynecology;  Laterality: N/A;   ROTATOR CUFF REPAIR Bilateral    SCAR REVISION N/A 12/04/2022   Procedure: REVISION OF MASTECTOMY SCAR;  Surgeon: Stark Klein, MD;  Location: [facility];  Service: General;  Laterality: N/A;   SHOULDER ARTHROSCOPY WITH ROTATOR CUFF REPAIR AND SUBACROMIAL DECOMPRESSION Right 09/03/2018   Procedure: Right shoulder manipulation under anesthesia, exam under anesthesia, mini open rotator cuff repair, subacromial decompression;  Surgeon: Susa Day, MD;  Location: [facility];  Service: Orthopedics;  Laterality: Right;  90 mins   TUBAL LIGATION     WISDOM TOOTH EXTRACTION       Family History  Problem Relation Age of Onset   Cancer Mother 14       breast cancer   Hypertension Mother    Diabetes Father    Cancer Sister        breast cancer   Breast cancer Sister    Cancer Sister        breast cancer   Breast cancer Sister      Social History   Socioeconomic History   Marital status: Divorced    Spouse name: n/a   Number of children: 2   Years of education: 12   Highest education level: Not on file  Occupational History   Occupation: Retired    Fish farm manager: [employer]    Comment: [employer]  Tobacco Use   Smoking status: Never   Smokeless tobacco: Never  Vaping Use   Vaping Use: Never used  Substance and  Sexual Activity   Alcohol use: No   Drug use: No   Sexual activity: Not Currently     Partners: Male    Birth control/protection: Surgical, Post-menopausal    Comment: Hysterectomy  Other Topics Concern   Not on file  Social History Narrative   Marital status: divorced; dating seriously x many years.       Children:  2 children; 1 grandchild.      Employment: Personnel officer.      Lives: alone      Tobacco: none       Alcohol: none      Exercise:  Walking with sister two days per week.      Seatbelt: 100%      Guns: none      Right-handed   Caffeine: occasional coffee, hot tea or green tea a few times per week   Social Determinants of Health   Financial Resource Strain: Not on file  Food Insecurity: Not on file  Transportation Needs: Not on file  Physical Activity: Not on file  Stress: Not on file  Social Connections: Not on file  Intimate Partner Violence: Not on file     BP 124/72   Pulse 77   Ht 5' 7.5" (1.715 m)   Wt 179 lb 6.4 oz (81.4 kg)   SpO2 99%   BMI 27.68 kg/m   Physical Exam:  Well appearing NAD HEENT: Unremarkable Neck:  No JVD, no thyromegally Lymphatics:  No adenopathy Back:  No CVA tenderness Lungs:  Clear with no wheezes HEART:  Regular rate rhythm, no murmurs, no rubs, no clicks Abd:  soft, positive bowel sounds, no organomegally, no rebound, no guarding Ext:  2 plus pulses, no edema, no cyanosis, no clubbing Skin:  No rashes no nodules Neuro:  CN II through XII intact, motor grossly intact  EKG - NSR with ventricular pacing  DEVICE  Normal device function.  See PaceArt for details.   Assess/Plan: Chronic systolic heart failure - no change in symptoms. I tried reprogramming her device but could not make her QRS narrow. Biv ICD - her CS did not have an acceptable vein for biv pacing and her left bundle area pacing was initially good but it looks like she is only capturing the septum as her paced QRS does not have evidence of conduction system capture. If her symptoms worsen, we could consider a revision but we will  let her heal from her mastectomy.  Carleene Overlie Shakeya Kerkman,MD

## 2022-12-26 ENCOUNTER — Other Ambulatory Visit: Payer: Self-pay

## 2022-12-26 DIAGNOSIS — C50412 Malignant neoplasm of upper-outer quadrant of left female breast: Secondary | ICD-10-CM

## 2022-12-27 ENCOUNTER — Encounter: Payer: 59 | Admitting: Physician Assistant

## 2022-12-31 ENCOUNTER — Ambulatory Visit (INDEPENDENT_AMBULATORY_CARE_PROVIDER_SITE_OTHER): Payer: 59 | Admitting: Physician Assistant

## 2022-12-31 ENCOUNTER — Encounter: Payer: Self-pay | Admitting: Physician Assistant

## 2022-12-31 VITALS — BP 155/76 | HR 85

## 2022-12-31 DIAGNOSIS — Z9889 Other specified postprocedural states: Secondary | ICD-10-CM

## 2022-12-31 NOTE — Progress Notes (Signed)
Patient is a pleasant 72 year old female with PMH of breast cancer and bilateral breast radiation s/p reconstruction 11/2021 and removal 01/2022 with chronic left mastectomy site drainage now s/p left-sided scar revision at time of right-sided lumpectomy performed in conjunction by Dr. Lovena Le and [name] 12/04/2022 who presents to clinic for postoperative follow-up.   She was last seen here in clinic on 12/20/2022.  There was a small area of tenderness over the middle portion of the scar revision.  Running Prolene suture was removed from both the medial and lateral aspects of scar revision, but left in place over area of concern.  Plan is for her to follow-up in 2 weeks for removal.  She was evaluated by Dr. Lovena Le who agrees with that assessment and plan.  Today, patient is accompanied by daughter at bedside.  Reports that the discomfort/tenderness over middle portion of scar revision at last encounter has improved.  She feels as though things are getting better and she is ready for suture removal.  On exam, no erythema, swelling, or other obvious concern for infection.  Remainder of Prolene running suture is removed without complication or difficulty.  She does have questionable incisional wound versus step-off over the same area of concern from last visit.  This is approximately at middle of mastectomy incision where it turns to start moving inferiorly.  It is particularly tight in this area.  No considerable drainage appreciated.  No crepitance or fluctuance.  Minimal tenderness to palpation.  No obvious cellulitis.  No malodor.  Small incisional wound/area of step-off between skin edges.  This is at the same spot where she had her previous pinpoint wound leading to the scar revision, but at this time do not feel as though antibiotics are warranted.  No evidence particularly concerning for infection.  Recommend that she continue to keep it clean and covered with Vaseline once daily followed by a bandage.   Remainder of mastectomy incision is well-healed.  Unfortunately her skin/tissue is not particularly healthy and is tight due to radiation.  Will have her follow-up with Dr. Lovena Le for next visit.

## 2023-01-01 NOTE — Progress Notes (Signed)
Remote ICD transmission.   

## 2023-01-09 ENCOUNTER — Other Ambulatory Visit: Payer: Self-pay | Admitting: Cardiology

## 2023-01-09 DIAGNOSIS — I428 Other cardiomyopathies: Secondary | ICD-10-CM

## 2023-01-10 ENCOUNTER — Ambulatory Visit (INDEPENDENT_AMBULATORY_CARE_PROVIDER_SITE_OTHER): Payer: 59 | Admitting: Physician Assistant

## 2023-01-10 ENCOUNTER — Telehealth: Payer: Self-pay

## 2023-01-10 DIAGNOSIS — Z9889 Other specified postprocedural states: Secondary | ICD-10-CM

## 2023-01-10 DIAGNOSIS — Z171 Estrogen receptor negative status [ER-]: Secondary | ICD-10-CM | POA: Diagnosis not present

## 2023-01-10 DIAGNOSIS — C50412 Malignant neoplasm of upper-outer quadrant of left female breast: Secondary | ICD-10-CM | POA: Diagnosis not present

## 2023-01-10 NOTE — Telephone Encounter (Signed)
Patient called to inform us that she has been feeling dizzy and fatigue. Patient mention she called her PCP and was told to give [place] a call due that he thinks it could be one of her cardiac medications

## 2023-01-10 NOTE — Progress Notes (Signed)
Patient is a pleasant 72 year old female with PMH of breast cancer and bilateral breast radiation s/p reconstruction 11/2021 and removal 01/2022 with chronic left mastectomy site drainage now s/p left-sided scar revision at time of right-sided lumpectomy performed in conjunction by Dr. Lovena Le and [name] 12/04/2022 who presents to clinic for postoperative follow-up.  She was last seen here in clinic on 12/31/2022.  At that time, she felt as though the discomfort over medial portion of scar revision had improved and she felt prepared for suture removal.  Remainder of Prolene suture was removed without complication or difficulty.  She did have some questionable incisional wound versus step-off over the same area of concern that prompted her to go to surgery for revision.  Skin is particularly tight.  No considerable drainage or evidence concerning for infection.  Recommended Vaseline once daily followed by bandage.  Remainder of incision well-healed.  Today, she is doing well.  She is accompanied by her daughter at bedside.  She tells me that she has had minimal drainage, if at all.  Usually she places Vaseline followed by gauze and at most there is a small spot of drainage at the end of the day.  She feels as though the pain and discomfort has improved along with the surrounding redness.  She is overall quite pleased with her progress and continues to deny any systemic symptoms.  On exam, incision appears to be all well-healed with the exception of a 1.25 x 0.2 x 0.2 cm incisional wound at the point of concern.  This is only visualized by actively spreading the skin edges for close inspection.  No drainage noted.  No surrounding skin changes.  Recommend continued Vaseline and gauze to keep it clean and covered.  Her radiated tissue is making progress and healing, albeit slowly.  Advised continue activity modifications until she is without any drainage at all.  At this point, feel comfortable with her following  up in 4 weeks.  She understands that she can certainly call the clinic should she have any questions or concerns in interim.  Picture(s) obtained of the patient and placed in the chart were with the patient's or guardian's permission.

## 2023-01-13 ENCOUNTER — Other Ambulatory Visit: Payer: Self-pay | Admitting: Cardiology

## 2023-01-13 DIAGNOSIS — I5042 Chronic combined systolic (congestive) and diastolic (congestive) heart failure: Secondary | ICD-10-CM

## 2023-01-13 DIAGNOSIS — I429 Cardiomyopathy, unspecified: Secondary | ICD-10-CM

## 2023-01-13 DIAGNOSIS — I10 Essential (primary) hypertension: Secondary | ICD-10-CM

## 2023-01-13 DIAGNOSIS — I428 Other cardiomyopathies: Secondary | ICD-10-CM

## 2023-01-14 NOTE — Telephone Encounter (Signed)
Spoke to the patient over the phone.  Her blood pressures are now much better.  She did hold medications when her blood pressures were soft.  She recently saw Dr. Lovena Le and we spoke about the visit and the current status of the BiV pacemaker/ICD.  She has an appointment with Dr. Haroldine Laws in the coming weeks to discuss her candidacy for breast cancer medications given her cardiomyopathy and the overall clinical trajectory.  I have advised the patient to make sure that at least her daughter accompanies her during the visit.   Jordan Willis [place], DO, [facility]

## 2023-01-20 DIAGNOSIS — I10 Essential (primary) hypertension: Secondary | ICD-10-CM | POA: Diagnosis not present

## 2023-01-20 DIAGNOSIS — E1165 Type 2 diabetes mellitus with hyperglycemia: Secondary | ICD-10-CM | POA: Diagnosis not present

## 2023-01-20 DIAGNOSIS — E7800 Pure hypercholesterolemia, unspecified: Secondary | ICD-10-CM | POA: Diagnosis not present

## 2023-01-22 ENCOUNTER — Encounter (HOSPITAL_COMMUNITY): Payer: Self-pay | Admitting: Internal Medicine

## 2023-01-22 ENCOUNTER — Telehealth: Payer: Self-pay | Admitting: Hematology and Oncology

## 2023-01-22 ENCOUNTER — Ambulatory Visit (HOSPITAL_COMMUNITY)
Admission: RE | Admit: 2023-01-22 | Discharge: 2023-01-22 | Disposition: A | Discharge status: Home / Self Care | Payer: 59 | Source: Ambulatory Visit | Attending: Internal Medicine | Admitting: Internal Medicine

## 2023-01-22 VITALS — BP 120/68 | HR 74 | Wt 179.6 lb

## 2023-01-22 DIAGNOSIS — I428 Other cardiomyopathies: Secondary | ICD-10-CM | POA: Insufficient documentation

## 2023-01-22 DIAGNOSIS — Z923 Personal history of irradiation: Secondary | ICD-10-CM | POA: Insufficient documentation

## 2023-01-22 DIAGNOSIS — I447 Left bundle-branch block, unspecified: Secondary | ICD-10-CM | POA: Insufficient documentation

## 2023-01-22 DIAGNOSIS — C50412 Malignant neoplasm of upper-outer quadrant of left female breast: Secondary | ICD-10-CM | POA: Diagnosis not present

## 2023-01-22 DIAGNOSIS — Z853 Personal history of malignant neoplasm of breast: Secondary | ICD-10-CM | POA: Diagnosis not present

## 2023-01-22 DIAGNOSIS — Z79899 Other long term (current) drug therapy: Secondary | ICD-10-CM | POA: Diagnosis not present

## 2023-01-22 DIAGNOSIS — Z1501 Genetic susceptibility to malignant neoplasm of breast: Secondary | ICD-10-CM | POA: Insufficient documentation

## 2023-01-22 DIAGNOSIS — Z95810 Presence of automatic (implantable) cardiac defibrillator: Secondary | ICD-10-CM | POA: Diagnosis not present

## 2023-01-22 DIAGNOSIS — I110 Hypertensive heart disease with heart failure: Secondary | ICD-10-CM | POA: Diagnosis not present

## 2023-01-22 DIAGNOSIS — Z7984 Long term (current) use of oral hypoglycemic drugs: Secondary | ICD-10-CM | POA: Diagnosis not present

## 2023-01-22 DIAGNOSIS — I5022 Chronic systolic (congestive) heart failure: Secondary | ICD-10-CM | POA: Insufficient documentation

## 2023-01-22 DIAGNOSIS — R5383 Other fatigue: Secondary | ICD-10-CM | POA: Diagnosis not present

## 2023-01-22 DIAGNOSIS — Z9013 Acquired absence of bilateral breasts and nipples: Secondary | ICD-10-CM | POA: Diagnosis not present

## 2023-01-22 DIAGNOSIS — I5032 Chronic diastolic (congestive) heart failure: Secondary | ICD-10-CM | POA: Diagnosis not present

## 2023-01-22 DIAGNOSIS — Z171 Estrogen receptor negative status [ER-]: Secondary | ICD-10-CM | POA: Diagnosis not present

## 2023-01-22 DIAGNOSIS — E1136 Type 2 diabetes mellitus with diabetic cataract: Secondary | ICD-10-CM | POA: Diagnosis not present

## 2023-01-22 MED ORDER — ENTRESTO 24-26 MG PO TABS: 1.0000 | ORAL_TABLET | Freq: Two times a day (BID) | ORAL | 6 refills | Status: DC

## 2023-01-22 NOTE — Telephone Encounter (Signed)
I discussed with Dr. Haroldine Laws about the pros and cons of initiating anti-HER2 therapy.  Patient has wall motion abnormalities that are more indicative of bundle branch block rather than cardiomyopathy.  Her ejection fraction is about 40%. I discussed with Dr. Haroldine Laws about the risks and benefits and I will discuss this once again with the patient.  I suspect that her risk of recurrence distantly could be anywhere between 20 to 25% in the next 5 years.  Kadcyla would markedly reduce the risk.  However if the cardiac function worsens then she has significant immediate risk of cardiac failure as well.  Therefore I would like to discuss all of these options with her with a telephone call in 1 week.

## 2023-01-22 NOTE — Progress Notes (Addendum)
CARDIO-ONCOLOGY CLINIC CONSULT NOTE  Referring Physician: Dr. Terri Skains Primary Care: [name], MD Primary Cardiologist: Dr. Terri Skains Oncologist: [name]  HPI:    Jordan Willis is a 72 y.o. with LBBB, DM2,recurrent breast cancer (BRCA +) status post bilateral mastectomies, chronic systolic HF due to NICM referred by Dr. Terri Skains for further evaluation of her HF.    Diagnosed with L breast CA in 2006. 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  ER 90%, PR 0%, Ki-67 70%, HER2 3+ positive Left breast mastectomy scar reexcision: Benign    Echo 6/22 EF 50-55%   Back in June 2022 she had low normal LVEF and after recurrence of breast cancer  cancer she was placed on Herceptin for approximately 3 months  Jan 2023-April 2023.  LVEF in April 2023 was noted to be 35-40% and Herceptin was discontinued.  Despite stopping Herceptin her LVEF has not improved.  She has been on maximally tolerated GDMT and also underwent BiV ICD placement in November 2023.   She had a repeat echocardiogram in January 2024 without any significant improvement in LVEF. She follows [name] from hematology/oncology and wanted to consider a trial of anti-HER2 therapy (Kadcyla).  After discussion w/ [name] given the risk of worsening cardiotoxicity the shared decision was to continue monitoring/surveillance.  Echocardiogram: 03/29/2021:  50-55%, G1DD, moderate MR, Moderate  TR, PASP 77mmHG.    06/15/2021: LVEF 30-35%, severe hypokinesis of the anteroseptal and apex, moderate to severe LV dysfunction, grade 1 diastolic dysfunction, elevated LAP, global longitudinal strain -16.7%, mild to moderate Mr.    10/09/2021: 45-50%, no regional wall motion abnormalities, global longitudinal strain -13.2%, mild MR.    01/29/2022: 35-40%, average GLS [id number], grade 1 diastolic impairment, severe hypokinesis of the left ventricle with regional wall motion abnormalities, moderately dilated left atrium, posterior pericardial effusion without tamponade, moderate to severe MR, estimated RAP 8 mmHg.   10/29/2022 EF 30-35% RV low norma    Stress Testing:  02/14/2018 exercise nuclear stress test at [facility]: Achieved 7 METS, normal blood pressure response to exercise, had exertional chest pain that improved/resolved during recovery, normal myocardial perfusion study no EKG abnormalities per report.   Heart Catheterization: Left Heart Catheterization 03/27/22: No CAD EF 30-35%   MUGA scan 05/10/2022: 1. Left ventricular ejection fraction is equal to 35.3%. 2. Moderate to marked septal hypokinesis. 3. Left ventricular dilatation.  Here with son and daughter. Remains fatigued. BP remains low at times and will hold metoprolol. Checks BP every morning. SBP typically 90-120. No edema. Lives with daughter and her mother. Can do ADLs but gets fatigued easily and has to take her time. Feels fatigue is getting worse. Saw Dr. Lovena Le in 3/24 her CS did not have an acceptable vein for biv pacing and her left bundle area pacing was initially good but currently was only capturing the septum so not getting effective Biv pacing. Discussed option for lead revision.    Review of Systems: [  y] = yes, [ ]  = no   General: Weight gain [ ] ; Weight loss [ ] ; Anorexia [ ] ; Fatigue Cove.Etienne ]; Fever [ ] ; Chills [ ] ; Weakness [ ]   Cardiac: Chest pain/pressure [ ] ; Resting SOB [ ] ; Exertional SOB [ y];  Orthopnea [ ] ; Pedal Edema [ ] ; Palpitations [ ] ; Syncope [ ] ; Presyncope [ ] ; Paroxysmal nocturnal dyspnea[ ]   Pulmonary: Cough [ ] ; Wheezing[ ] ; Hemoptysis[ ] ; Sputum [ ] ; Snoring [ ]   GI: Vomiting[ ] ; Dysphagia[ ] ; Melena[ ] ; Hematochezia [ ] ; Heartburn[ ] ; Abdominal pain [ ] ; Constipation [ ] ; Diarrhea [ ] ; BRBPR [ ]   GU: Hematuria[ ] ; Dysuria [ ] ; Nocturia[ ]   Vascular: Pain in legs with walking [ ] ; Pain in feet with lying flat [ ] ; Non-healing sores [ ] ; Stroke [ ] ; TIA [ ] ; Slurred speech [ ] ;  Neuro: Headaches[ ] ; Vertigo[ ] ; Seizures[ ] ; Paresthesias[ ] ;Blurred vision [ ] ; Diplopia [ ] ; Vision changes [ ]   Ortho/Skin: Arthritis [ y]; Joint pain Cove.Etienne ]; Muscle pain [ ] ; Joint swelling [ ] ; Back Pain [ ] ; Rash [ ]   Psych: Depression[ ] ; Anxiety[ ]   Heme: Bleeding problems [ ] ; Clotting disorders [ ] ; Anemia [ ]   Endocrine: Diabetes [ ] ; Thyroid dysfunction[ ]    Past Medical History:  Diagnosis Date   AICD (automatic cardioverter/defibrillator) present    Anemia    Anginal pain    Anxiety    Arthritis    Lumbar spine DDD.   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   Diabetes mellitus without complication    Type II   Dyspnea    When patient gets sick uses Pro-Air inhaler   Frequent sinus infections    GERD (gastroesophageal reflux disease)    occ   Goiter    History of cardiomegaly    History of cataract    Bilateral   History of kidney stones    08/28/2018 currently has a large kidney stone, asymptomatic at this time   History of migraine    History of uterine fibroid    History of uterine prolapse    Hyperlipidemia    Hypertension    LBBB (left bundle branch block)    Menopause 03/03/2012   Nasal turbinate hypertrophy 11/29/2016   Left inferior    Personal history of chemotherapy    Personal  history of radiation therapy    Pneumonia    as a child   PONV (postoperative nausea and vomiting)    Sinusitis 2014   Sleep apnea    No CPAP   SUI (stress urinary incontinence, female)    SVD (spontaneous vaginal delivery)    x 2   Thyroid nodule     Current Outpatient Medications  Medication Sig Dispense Refill   albuterol (VENTOLIN HFA) 108 (90 Base) MCG/ACT inhaler Inhale 2 puffs into the lungs every 6 (six) hours as needed for wheezing or shortness of breath.     b complex vitamins capsule Take 1 capsule by mouth daily.     baclofen (LIORESAL) 10 MG tablet Take 10 mg by mouth 3 (three) times daily.     bumetanide (BUMEX) 1 MG tablet TAKE 1 TABLET BY MOUTH EVERY DAY 90 tablet 1   cetirizine (ZYRTEC) 10 MG tablet Take 10 mg by  mouth in the morning.     Cholecalciferol (VITAMIN D3) 25 MCG (1000 UT) CAPS Take 1,000 Units by mouth in the morning.     diclofenac Sodium (VOLTAREN) 1 % GEL Apply 1 Application topically 4 (four) times daily as needed (pain).     exemestane (AROMASIN) 25 MG tablet Take 1 tablet (25 mg total) by mouth daily after breakfast. 90 tablet 3   FARXIGA 10 MG TABS tablet TAKE 1 TABLET BY MOUTH EVERY DAY BEFORE BREAKFAST 90 tablet 0   fluticasone (FLONASE) 50 MCG/ACT nasal spray Place 1 spray into both nostrils in the morning.     GLUCOSAMINE-CHONDROITIN PO Take 1 tablet by mouth every evening.      glucose blood (ONETOUCH VERIO) test strip TEST ONCE DAILY AS DIRECTED 90     ibuprofen (ADVIL) 200 MG tablet Take 400 mg by mouth every 6 (six) hours as needed for mild pain or moderate pain.     metFORMIN (GLUCOPHAGE-XR) 500 MG 24 hr tablet Take 500 mg by mouth 2 (two) times daily.     metoprolol succinate (TOPROL-XL) 25 MG 24 hr tablet TAKE 1 TABLET (25 MG TOTAL) BY MOUTH IN THE MORNING. HOLD IF SYSTOLIC BLOOD PRESSURE (TOP BLOOD PRESSURE NUMBER) LESS THAN 100 MMHG OR HEART RATE LESS THAN 60 BPM (PULSE). 90 tablet 3   Multiple Vitamin (MULTIVITAMIN WITH MINERALS) TABS  tablet Take 1 tablet by mouth daily. Centrum Silver for Women 50+     nitroGLYCERIN (NITROSTAT) 0.4 MG SL tablet Place 0.4 mg under the tongue every 5 (five) minutes as needed for chest pain.     Olopatadine HCl 0.2 % SOLN Place 1 drop into both eyes daily. Pataday     ondansetron (ZOFRAN) 4 MG tablet Take 1 tablet (4 mg total) by mouth every 6 (six) hours as needed for nausea or vomiting. 20 tablet 0   OneTouch Delica Lancets 99991111 MISC Apply topically.     oxyCODONE (OXY IR/ROXICODONE) 5 MG immediate release tablet Take 1 tablet (5 mg total) by mouth every 6 (six) hours as needed for severe pain or moderate pain. 30 tablet 0   Probiotic Product (PROBIOTIC DAILY PO) Take 1 capsule by mouth in the morning.     rosuvastatin (CRESTOR) 20 MG tablet Take 20 mg by mouth at bedtime.     sacubitril-valsartan (ENTRESTO) 49-51 MG Take 1 tablet by mouth 2 (two) times daily. 180 tablet 1   sertraline (ZOLOFT) 50 MG tablet Take 50 mg by mouth in the morning.     spironolactone (ALDACTONE) 25 MG tablet TAKE 1 TABLET BY MOUTH EVERY DAY IN THE MORNING 90 tablet 0   No current facility-administered medications for this encounter.    Allergies  Allergen Reactions   Bactrim [Sulfamethoxazole-Trimethoprim] Shortness Of Breath    Shortness of breath, difficulty breathing, headache, rash, fatigue   Aspirin Palpitations and Other (See Comments)    Heart fluttering, pt takes 40.5 mg (half of 81 mg) daily Other reaction(s): bradycardia, Not available   Sulfa Antibiotics Hives   Tape Rash    Tears skin off      Social History   Socioeconomic History   Marital status: Divorced    Spouse name: n/a   Number of children: 2   Years of education: 12   Highest education level: Not on file  Occupational History   Occupation: Retired    Fish farm manager: [employer]    Comment: [employer]  Tobacco Use   Smoking status: Never   Smokeless  tobacco: Never  Vaping Use   Vaping Use: Never used  Substance and Sexual  Activity   Alcohol use: No   Drug use: No   Sexual activity: Not Currently    Partners: Male    Birth control/protection: Surgical, Post-menopausal    Comment: Hysterectomy  Other Topics Concern   Not on file  Social History Narrative   Marital status: divorced; dating seriously x many years.       Children:  2 children; 1 grandchild.      Employment: Research officer, political party.      Lives: alone      Tobacco: none       Alcohol: none      Exercise:  Walking with sister two days per week.      Seatbelt: 100%      Guns: none      Right-handed   Caffeine: occasional coffee, hot tea or green tea a few times per week   Social Determinants of Health   Financial Resource Strain: Not on file  Food Insecurity: Not on file  Transportation Needs: Not on file  Physical Activity: Not on file  Stress: Not on file  Social Connections: Not on file  Intimate Partner Violence: Not on file      Family History  Problem Relation Age of Onset   Cancer Mother 70       breast cancer   Hypertension Mother    Diabetes Father    Cancer Sister        breast cancer   Breast cancer Sister    Cancer Sister        breast cancer   Breast cancer Sister     Vitals:   01/22/23 1504  BP: 120/68  Pulse: 74  SpO2: 97%  Weight: 81.5 kg (179 lb 9.6 oz)    PHYSICAL EXAM: General:  Elderly No respiratory difficulty HEENT: normal Neck: supple. no JVD. Carotids 2+ bilat; no bruits. No lymphadenopathy or thryomegaly appreciated. Cor: PMI nondisplaced. S/p bilateral mastectomies. Scar from recent surgery on scar healing well. Regular rate & rhythm. No rubs, gallops or murmurs. Lungs: clear Abdomen: soft, nontender, nondistended. No hepatosplenomegaly. No bruits or masses. Good bowel sounds. Extremities: no cyanosis, clubbing, rash, edema Neuro: alert & oriented x 3, cranial nerves grossly intact. moves all 4 extremities w/o difficulty. Affect pleasant.  ECG: NSR 77 LBBB 154 ms Personally  reviewed   ASSESSMENT & PLAN:  1. Systolic HF due to NICM  - Echo 6/22: EF 50-55%, G1DD, moderate MR, Moderate TR, PASP .  - Echo 8.22: EF 30-35%, severe anteroseptal/apical HK, G1DD mild-mod MR - Echo 4/23: EF 35-40% G1DD mod-sev MR - MUGA 7/23 EF 35% Moderate to marked septal hypokinesis. - Cath 6/23 No CAD EF 30-35% - Echo 1/24 EF 30-35% RV low normal triv MR - h/o LBBB s/p failed CRT-D (unable to place CS lead successfully - NYHA III - Volume status stable - In looking at her echos I suspect that majority of LV dysfunction is likely due to her LBBB and dyssynchrony but may have component of chemo-induced toxicity - Unable to get cMRI with ICD - Long talk with patient and fmily (as well as Dr. Pamelia Hoit regarding possibility of further chemo RX for recurrent/advanced breast CA. I told them that if risk of recurrence/death from recent breast CA is hgh > 30-40%, I would support cautious trial of Keytruda therapy understanding that the risk of acute myocarditis and eath is small but not trivial -  In speaking with Dr. Pamelia Hoit he feels as if the risk is < 20% and that watchful waiting is likely best plan for now.  - If remains stable from breast CA standpoint, will need to revisit attempt at LBBB with Dr. Ladona Ridgel understanding that this would be very technically challenging - Continue current GDMT with Marcelline Deist, spiro, Etnresto and Toprol per Dr. Odis Hollingshead. I have decreased Entresto to 24/26 bid given low BP (may need to switch back to losartan)  2. Recurrent breast CA - plan as above  Total time spent > 60 minutes. Over half that time spent discussing above.    Arvilla Meres, MD  3:21 PM

## 2023-01-22 NOTE — Patient Instructions (Signed)
DECREASE Entresto  to 24/26 mg Twice daily  Your physician recommends that you schedule a follow-up appointment in: 4 months(August) Call office in June to schedule an appointment  If you have any questions or concerns before your next appointment please send [place] a message through [place] or call our office at [phone].    TO LEAVE A MESSAGE FOR THE NURSE SELECT OPTION 2, PLEASE LEAVE A MESSAGE INCLUDING: YOUR NAME DATE OF BIRTH CALL BACK NUMBER REASON FOR CALL**this is important as we prioritize the call backs  YOU WILL RECEIVE A CALL BACK THE SAME DAY AS LONG AS YOU CALL BEFORE 4:00 PM  At the [facility], you and your health needs are our priority. As part of our continuing mission to provide you with exceptional heart care, we have created designated Provider Care Teams. These Care Teams include your primary Cardiologist (physician) and Advanced Practice Providers (APPs- Physician Assistants and Nurse Practitioners) who all work together to provide you with the care you need, when you need it.   You may see any of the following providers on your designated Care Team at your next follow up: Dr Glori Bickers Dr Loralie Champagne Dr. Roxana Hires, NP Lyda Jester, [place] [address] [facility] [place], [place] Forestine Na, NP Audry Riles, PharmD   Please be sure to bring in all your medications bottles to every appointment.    Thank you for choosing [facility]

## 2023-01-27 LAB — SIGNATERA ONLY (NATERA MANAGED)
SIGNATERA MTM READOUT: 0.07 MTM/ml — AB
SIGNATERA TEST RESULT: POSITIVE — AB

## 2023-01-30 ENCOUNTER — Encounter (HOSPITAL_COMMUNITY): Payer: Self-pay

## 2023-01-30 NOTE — Progress Notes (Signed)
Patient Care Team: Merri Brunette, MD as PCP - General (Internal Medicine) Silverio Lay, MD as Attending Physician (Obstetrics and Gynecology) Serena Croissant, MD as Consulting Physician (Hematology and Oncology) Almond Lint, MD as Consulting Physician (General Surgery) Marinus Maw, MD as Consulting Physician (Cardiology) Pershing Proud, RN as Oncology Nurse Navigator Donnelly Angelica, RN as Oncology Nurse Navigator  DIAGNOSIS: No diagnosis found.  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%, PR 0%, HER-2 negative, Ki-67 31%   09/26/2005 - 11/13/2005 Radiation Therapy   Adjuvant radiation therapy   03/18/2008 Initial Biopsy   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   03/22/2008 Surgery   Right breast lumpectomy 0.9 cm DCIS ER 90% PR 98% stage 0   04/11/2008 Procedure   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   05/10/2008 - 07/02/2008 Radiation Therapy   Adjuvant radiation therapy   07/17/2008 - 04/16/2009 Anti-estrogen oral therapy   Femara then switched to tamoxifen which was discontinued in 2010 due to concern about uterine cancer   05/31/2021 Cancer Staging   Staging form: Breast, AJCC 7th Edition - Clinical: Stage IA (rT1c, N0, M0) - Signed by Serena Croissant, MD on 05/31/2021 Stage prefix: Recurrence Laterality: Right Biopsy of metastatic site performed: No Estrogen  receptor status: Positive Progesterone receptor status: Negative HER2 status: Positive   09/12/2021 Cancer Staging   Staging form: Breast, AJCC 7th Edition - Pathologic: Stage IIA (T2, N0, cM0) - Signed by Serena Croissant, MD on 09/12/2021 Estrogen receptor status: Positive Progesterone receptor status: Positive HER2 status: Negative   10/05/2022 Relapse/Recurrence   Palpable lump in the right mastectomy scar: 1.4 x 1.3 x 1.3 cm and a 0.4 cm mass (presence of small residual fluid): Biopsy revealed grade 3 IDC with necrosis ER 90% PR 0% Ki-67 70%, HER2 3+ positive   12/04/2022 Surgery   Right breast mastectomy scar with chest wall mass 3 exertion: Grade 3 poorly differentiated ductal adenocarcinoma with extensive tumor necrosis, margins negative involving subcutis and deep dermis, vascular invasion present.  ER 90%, PR 0%, Ki-67 70%, HER2 3+ positive Left breast mastectomy scar reexcision: Benign   Breast cancer of upper-outer quadrant of right female breast  05/23/2021 Relapse/Recurrence   Screening detected right breast mass at 10 o'clock position measuring 1.2 cm, ultrasound-guided biopsy revealed grade 3 IDC ER 20%, PR 0%, HER2 positive 3+, Ki-67 50%   09/04/2021 Surgery   Bilateral mastectomies Left mastectomy: Benign Right mastectomy: Grade 3 IDC with DCIS 3.6 cm, 0/6 lymph nodes negative, ER 20%, PR 0%, HER2 positive by IHC, Ki-67 50%   11/21/2021 - 01/23/2022 Chemotherapy   Patient is on Treatment Plan : BREAST Trastuzumab q21d     04/2022 -  Anti-estrogen oral therapy   Anastrozole daily     CHIEF COMPLIANT:   INTERVAL HISTORY: ALIAH LEMELIN is a   ALLERGIES:  is allergic to bactrim [sulfamethoxazole-trimethoprim], aspirin,  sulfa antibiotics, and tape.  MEDICATIONS:  Current Outpatient Medications  Medication Sig Dispense Refill   albuterol (VENTOLIN HFA) 108 (90 Base) MCG/ACT inhaler Inhale 2 puffs into the lungs every 6 (six) hours as needed for wheezing or shortness of  breath.     b complex vitamins capsule Take 1 capsule by mouth daily.     baclofen (LIORESAL) 10 MG tablet Take 10 mg by mouth 3 (three) times daily.     bumetanide (BUMEX) 1 MG tablet TAKE 1 TABLET BY MOUTH EVERY DAY 90 tablet 1   cetirizine (ZYRTEC) 10 MG tablet Take 10 mg by mouth in the morning.     Cholecalciferol (VITAMIN D3) 25 MCG (1000 UT) CAPS Take 1,000 Units by mouth in the morning.     diclofenac Sodium (VOLTAREN) 1 % GEL Apply 1 Application topically 4 (four) times daily as needed (pain).     exemestane (AROMASIN) 25 MG tablet Take 1 tablet (25 mg total) by mouth daily after breakfast. 90 tablet 3   FARXIGA 10 MG TABS tablet TAKE 1 TABLET BY MOUTH EVERY DAY BEFORE BREAKFAST 90 tablet 0   fluticasone (FLONASE) 50 MCG/ACT nasal spray Place 1 spray into both nostrils in the morning.     GLUCOSAMINE-CHONDROITIN PO Take 1 tablet by mouth every evening.      glucose blood (ONETOUCH VERIO) test strip TEST ONCE DAILY AS DIRECTED 90     ibuprofen (ADVIL) 200 MG tablet Take 400 mg by mouth every 6 (six) hours as needed for mild pain or moderate pain.     metFORMIN (GLUCOPHAGE-XR) 500 MG 24 hr tablet Take 500 mg by mouth 2 (two) times daily.     metoprolol succinate (TOPROL-XL) 25 MG 24 hr tablet TAKE 1 TABLET (25 MG TOTAL) BY MOUTH IN THE MORNING. HOLD IF SYSTOLIC BLOOD PRESSURE (TOP BLOOD PRESSURE NUMBER) LESS THAN 100 MMHG OR HEART RATE LESS THAN 60 BPM (PULSE). 90 tablet 3   Multiple Vitamin (MULTIVITAMIN WITH MINERALS) TABS tablet Take 1 tablet by mouth daily. Centrum Silver for Women 50+     nitroGLYCERIN (NITROSTAT) 0.4 MG SL tablet Place 0.4 mg under the tongue every 5 (five) minutes as needed for chest pain.     Olopatadine HCl 0.2 % SOLN Place 1 drop into both eyes daily. Pataday     ondansetron (ZOFRAN) 4 MG tablet Take 1 tablet (4 mg total) by mouth every 6 (six) hours as needed for nausea or vomiting. 20 tablet 0   OneTouch Delica Lancets 33G MISC Apply topically.     oxyCODONE  (OXY IR/ROXICODONE) 5 MG immediate release tablet Take 1 tablet (5 mg total) by mouth every 6 (six) hours as needed for severe pain or moderate pain. 30 tablet 0   Probiotic Product (PROBIOTIC DAILY PO) Take 1 capsule by mouth in the morning.     rosuvastatin (CRESTOR) 20 MG tablet Take 20 mg by mouth at bedtime.     sacubitril-valsartan (ENTRESTO) 24-26 MG Take 1 tablet by mouth 2 (two) times daily. 60 tablet 6   sertraline (ZOLOFT) 50 MG tablet Take 50 mg by mouth in the morning.     spironolactone (ALDACTONE) 25 MG tablet TAKE 1 TABLET BY MOUTH EVERY DAY IN THE MORNING 90 tablet 0   No current facility-administered medications for this visit.    PHYSICAL EXAMINATION: ECOG PERFORMANCE STATUS: {CHL ONC ECOG PS:[phone]}  There were no vitals filed for this visit. There were no vitals filed for this visit.  BREAST:*** No palpable  masses or nodules in either right or left breasts. No palpable axillary supraclavicular or infraclavicular adenopathy no breast tenderness or nipple discharge. (exam performed in the presence of a chaperone)  LABORATORY DATA:  I have reviewed the data as listed    Latest Ref Rng & Units 11/26/2022    8:21 AM 09/19/2022    8:33 AM 08/01/2022    8:42 AM  CMP  Glucose 70 - 99 mg/dL 098107  119101  99   BUN 8 - 23 mg/dL 8  13  11    Creatinine 0.44 - 1.00 mg/dL 1.470.72  8.290.67  5.620.80   Sodium 135 - 145 mmol/L 137  135  139   Potassium 3.5 - 5.1 mmol/L 3.7  4.1  4.3   Chloride 98 - 111 mmol/L 102  98  101   CO2 22 - 32 mmol/L 23  24  26    Calcium 8.9 - 10.3 mg/dL 9.7  13.010.2  9.8     Lab Results  Component Value Date   WBC 4.7 11/26/2022   HGB 12.9 11/26/2022   HCT 39.4 11/26/2022   MCV 90.0 11/26/2022   PLT 254 11/26/2022   NEUTROABS 2.4 09/19/2022    ASSESSMENT & PLAN:  No problem-specific Assessment & Plan notes found for this encounter.    No orders of the defined types were placed in this encounter.  The patient has a good understanding of the overall  plan. she agrees with it. she will call with any problems that may develop before the next visit here. Total time spent: 30 mins including face to face time and time spent for planning, charting and co-ordination of care   Sherlyn LickDeritra L Griffith Santilli, CMA 01/30/23    I Janan Ridgeeritra, Jasiyah Poland am acting as a Neurosurgeonscribe for [employer] Service[employer] CompanyDr.Vinay Gudena  ***

## 2023-01-31 ENCOUNTER — Other Ambulatory Visit: Payer: Self-pay

## 2023-01-31 ENCOUNTER — Inpatient Hospital Stay: Payer: 59 | Attending: Hematology and Oncology | Admitting: Hematology and Oncology

## 2023-01-31 ENCOUNTER — Encounter: Payer: Self-pay | Admitting: Hematology and Oncology

## 2023-01-31 VITALS — BP 119/60 | HR 82 | Temp 97.8°F | Resp 18 | Ht 67.5 in | Wt 181.1 lb

## 2023-01-31 DIAGNOSIS — Z923 Personal history of irradiation: Secondary | ICD-10-CM | POA: Diagnosis not present

## 2023-01-31 DIAGNOSIS — Z853 Personal history of malignant neoplasm of breast: Secondary | ICD-10-CM | POA: Diagnosis not present

## 2023-01-31 DIAGNOSIS — C50412 Malignant neoplasm of upper-outer quadrant of left female breast: Secondary | ICD-10-CM

## 2023-01-31 DIAGNOSIS — Z1509 Genetic susceptibility to other malignant neoplasm: Secondary | ICD-10-CM | POA: Insufficient documentation

## 2023-01-31 DIAGNOSIS — Z79811 Long term (current) use of aromatase inhibitors: Secondary | ICD-10-CM | POA: Insufficient documentation

## 2023-01-31 DIAGNOSIS — Z171 Estrogen receptor negative status [ER-]: Secondary | ICD-10-CM

## 2023-01-31 DIAGNOSIS — Z9013 Acquired absence of bilateral breasts and nipples: Secondary | ICD-10-CM | POA: Insufficient documentation

## 2023-01-31 DIAGNOSIS — Z1501 Genetic susceptibility to malignant neoplasm of breast: Secondary | ICD-10-CM | POA: Insufficient documentation

## 2023-01-31 DIAGNOSIS — Z803 Family history of malignant neoplasm of breast: Secondary | ICD-10-CM | POA: Diagnosis not present

## 2023-01-31 NOTE — Progress Notes (Signed)
Referring Provider Merri Brunette, MD [address] [place],  [place] [address]   CC:  Chief Complaint  Patient presents with   Wound Check      Jordan Willis is an 72 y.o. female.  HPI: Patient is a pleasant 72 year old female with PMH of breast cancer and bilateral breast radiation s/p reconstruction 11/2021 and removal 01/2022 with chronic left mastectomy site drainage now s/p left-sided scar revision at time of right-sided lumpectomy performed in conjunction by Dr. Ladona Ridgel and Dr. Donell Beers 12/04/2022 who presents to clinic for postoperative follow-up.   She was last seen here in clinic on 01/10/2023.  At that time, incision appeared to be well-healed with the exception of a 1.25 x 0.2 x 0.2 cm incisional wound at the area of concern.  It is only visualized by mechanically spreading the skin edges under close inspection.  No drainage noted.  No surrounding skin changes.  No evidence concerning for infection.  Plan was for continued gauze and bandages, follow-up in 4 weeks.  Unfortunately this has been a persistent area of concern over the course of the past 12 months given the radiated tissue and focal healing limitations.  Today, patient is accompanied by daughter at bedside.  She denies any obvious drainage or issues from the left side.  She continues to deny any leg swelling, fevers, pain, or other complications.  However, she was informed that she would need to restart her chemotherapy after an abnormal lab test.     Allergies  Allergen Reactions   Bactrim [Sulfamethoxazole-Trimethoprim] Shortness Of Breath    Shortness of breath, difficulty breathing, headache, rash, fatigue   Aspirin Palpitations and Other (See Comments)    Heart fluttering, pt takes 40.5 mg (half of 81 mg) daily Other reaction(s): bradycardia, Not available   Sulfa Antibiotics Hives   Tape Rash    Tears skin off    Outpatient Encounter Medications as of 02/04/2023  Medication Sig   albuterol  (VENTOLIN HFA) 108 (90 Base) MCG/ACT inhaler Inhale 2 puffs into the lungs every 6 (six) hours as needed for wheezing or shortness of breath.   b complex vitamins capsule Take 1 capsule by mouth daily.   baclofen (LIORESAL) 10 MG tablet Take 10 mg by mouth 3 (three) times daily.   bumetanide (BUMEX) 1 MG tablet TAKE 1 TABLET BY MOUTH EVERY DAY   cetirizine (ZYRTEC) 10 MG tablet Take 10 mg by mouth in the morning.   Cholecalciferol (VITAMIN D3) 25 MCG (1000 UT) CAPS Take 1,000 Units by mouth in the morning.   diclofenac Sodium (VOLTAREN) 1 % GEL Apply 1 Application topically 4 (four) times daily as needed (pain).   exemestane (AROMASIN) 25 MG tablet Take 1 tablet (25 mg total) by mouth daily after breakfast.   FARXIGA 10 MG TABS tablet TAKE 1 TABLET BY MOUTH EVERY DAY BEFORE BREAKFAST   fluticasone (FLONASE) 50 MCG/ACT nasal spray Place 1 spray into both nostrils in the morning.   GLUCOSAMINE-CHONDROITIN PO Take 1 tablet by mouth every evening.    glucose blood (ONETOUCH VERIO) test strip TEST ONCE DAILY AS DIRECTED 90   ibuprofen (ADVIL) 200 MG tablet Take 400 mg by mouth every 6 (six) hours as needed for mild pain or moderate pain.   metFORMIN (GLUCOPHAGE-XR) 500 MG 24 hr tablet Take 500 mg by mouth 2 (two) times daily.   metoprolol succinate (TOPROL-XL) 25 MG 24 hr tablet TAKE 1 TABLET (25 MG TOTAL) BY MOUTH IN THE MORNING. HOLD IF  SYSTOLIC BLOOD PRESSURE (TOP BLOOD PRESSURE NUMBER) LESS THAN 100 MMHG OR HEART RATE LESS THAN 60 BPM (PULSE).   Multiple Vitamin (MULTIVITAMIN WITH MINERALS) TABS tablet Take 1 tablet by mouth daily. Centrum Silver for Women 50+   nitroGLYCERIN (NITROSTAT) 0.4 MG SL tablet Place 0.4 mg under the tongue every 5 (five) minutes as needed for chest pain.   Olopatadine HCl 0.2 % SOLN Place 1 drop into both eyes daily. Pataday   ondansetron (ZOFRAN) 4 MG tablet Take 1 tablet (4 mg total) by mouth every 6 (six) hours as needed for nausea or vomiting.   OneTouch Delica  Lancets 33G MISC Apply topically.   oxyCODONE (OXY IR/ROXICODONE) 5 MG immediate release tablet Take 1 tablet (5 mg total) by mouth every 6 (six) hours as needed for severe pain or moderate pain.   Probiotic Product (PROBIOTIC DAILY PO) Take 1 capsule by mouth in the morning.   rosuvastatin (CRESTOR) 20 MG tablet Take 20 mg by mouth at bedtime.   sacubitril-valsartan (ENTRESTO) 24-26 MG Take 1 tablet by mouth 2 (two) times daily.   sertraline (ZOLOFT) 50 MG tablet Take 50 mg by mouth in the morning.   spironolactone (ALDACTONE) 25 MG tablet TAKE 1 TABLET BY MOUTH EVERY DAY IN THE MORNING   No facility-administered encounter medications on file as of 02/04/2023.     Past Medical History:  Diagnosis Date   AICD (automatic cardioverter/defibrillator) present    Anemia    Anginal pain    Anxiety    Arthritis    Lumbar spine DDD.   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   Diabetes mellitus without complication    Type II   Dyspnea    When patient gets sick uses Pro-Air inhaler   Frequent sinus infections    GERD (gastroesophageal reflux disease)    occ   Goiter    History of cardiomegaly    History of cataract    Bilateral   History of kidney stones    08/28/2018 currently has a large kidney stone, asymptomatic at this time   History of migraine    History of uterine fibroid    History of uterine prolapse    Hyperlipidemia    Hypertension    LBBB (left bundle branch block)    Menopause 03/03/2012   Nasal turbinate hypertrophy 11/29/2016   Left inferior    Personal history of chemotherapy    Personal history of radiation therapy    Pneumonia    as a child   PONV (postoperative nausea and vomiting)    Sinusitis 2014   Sleep apnea    No CPAP   SUI (stress urinary incontinence, female)    SVD  (spontaneous vaginal delivery)    x 2   Thyroid nodule     Past Surgical History:  Procedure Laterality Date   ABDOMINAL HYSTERECTOMY     BILATERAL SALPINGOOPHORECTOMY Bilateral    BIV ICD INSERTION CRT-D N/A 08/24/2022   Procedure: BIV ICD INSERTION CRT-D;  Surgeon: Marinus Maw, MD;  Location: [facility] INVASIVE CV LAB;  Service: Cardiovascular;  Laterality: N/A;   BLADDER SUSPENSION N/A 05/16/2017   Procedure: TRANSVAGINAL TAPE (TVT) PROCEDURE;  Surgeon: Osborn Coho, MD;  Location: [facility] ORS;  Service: Gynecology;  Laterality: N/A;   BREAST BIOPSY Right 2009  BREAST IMPLANT REMOVAL Bilateral 02/05/2022   Procedure: Removal bilateral breast implants;  Surgeon: Allena Napoleon, MD;  Location[facility] OR;  Service: Plastics;  Laterality: Bilateral;   BREAST LUMPECTOMY Right 2009   BREAST LUMPECTOMY Left 2006   BREAST RECONSTRUCTION WITH PLACEMENT OF TISSUE EXPANDER AND FLEX HD (ACELLULAR HYDRATED DERMIS) Bilateral 09/04/2021   Procedure: BILATERAL BREAST RECONSTRUCTION WITH PLACEMENT OF TISSUE EXPANDER AND FLEX HD (ACELLULAR HYDRATED DERMIS);  Surgeon: Allena Napoleon, MD;  Location: [facility] OR;  Service: Plastics;  Laterality: Bilateral;   CARDIAC CATHETERIZATION     CATARACT EXTRACTION Left 10/23/2017   CATARACT EXTRACTION Right 11/06/2017   COLONOSCOPY  10/22/2009   Normal.  Repeat 5 years.  [name]   COLONOSCOPY WITH PROPOFOL N/A 12/21/2022   Procedure: COLONOSCOPY WITH PROPOFOL;  Surgeon: Jeani Hawking, MD;  Location: [facility] ENDOSCOPY;  Service: Gastroenterology;  Laterality: N/A;   CYSTOSCOPY N/A 05/16/2017   Procedure: CYSTOSCOPY;  Surgeon: Osborn Coho, MD;  Location: [facility] ORS;  Service: Gynecology;  Laterality: N/A;   DEBRIDEMENT AND CLOSURE WOUND Bilateral 10/03/2021   Procedure: Debridement bilateral mastectomy flaps;  Surgeon: Allena Napoleon, MD;  Location: [facility] OR;  Service: Plastics;  Laterality: Bilateral;  1.5 hour   EYE SURGERY     bilateral cataracts   LEFT HEART CATH AND CORONARY  ANGIOGRAPHY N/A 02/09/2020   Procedure: LEFT HEART CATH AND CORONARY ANGIOGRAPHY;  Surgeon: Elder Negus, MD;  Location: [facility] INVASIVE CV LAB;  Service: Cardiovascular;  Laterality: N/A;   LEFT HEART CATH AND CORONARY ANGIOGRAPHY N/A 03/27/2022   Procedure: LEFT HEART CATH AND CORONARY ANGIOGRAPHY;  Surgeon: Yates Decamp, MD;  Location: [facility] INVASIVE CV LAB;  Service: Cardiovascular;  Laterality: N/A;   MASS EXCISION Right 12/04/2022   Procedure: EXCISION OF RIGHT CHEST WALL MASS;  Surgeon: Almond Lint, MD;  Location: [facility] OR;  Service: General;  Laterality: Right;   MASTECTOMY W/ SENTINEL NODE BIOPSY Right 09/04/2021   Procedure: BILATERAL MASTECTOMIES WITH RIGHT SENTINEL LYMPH NODE BIOPSY;  Surgeon: Almond Lint, MD;  Location: [facility] OR;  Service: General;  Laterality: Right;   REMOVAL OF BILATERAL TISSUE EXPANDERS WITH PLACEMENT OF BILATERAL BREAST IMPLANTS Bilateral 10/03/2021   Procedure: REMOVAL OF BILATERAL TISSUE EXPANDERS WITH REPLACEMENT OF TISSUE EXPANDERS;  Surgeon: Allena Napoleon, MD;  Location: [facility] OR;  Service: Plastics;  Laterality: Bilateral;   REMOVAL OF BILATERAL TISSUE EXPANDERS WITH PLACEMENT OF BILATERAL BREAST IMPLANTS Bilateral 12/19/2021   Procedure: REMOVAL OF BILATERAL TISSUE EXPANDERS WITH PLACEMENT OF BILATERAL BREAST IMPLANTS;  Surgeon: Allena Napoleon, MD;  Location: [facility] OR;  Service: Plastics;  Laterality: Bilateral;   ROBOTIC ASSISTED TOTAL HYSTERECTOMY N/A 05/16/2017   Procedure: ROBOTIC ASSISTED TOTAL HYSTERECTOMY;  Surgeon: Silverio Lay, MD;  Location: [facility] ORS;  Service: Gynecology;  Laterality: N/A;   ROTATOR CUFF REPAIR Bilateral    SCAR REVISION N/A 12/04/2022   Procedure: REVISION OF MASTECTOMY SCAR;  Surgeon: Almond Lint, MD;  Location: [facility] OR;  Service: General;  Laterality: N/A;   SHOULDER ARTHROSCOPY WITH ROTATOR CUFF REPAIR AND SUBACROMIAL DECOMPRESSION Right 09/03/2018   Procedure: Right shoulder manipulation under anesthesia, exam under anesthesia, mini  open rotator cuff repair, subacromial decompression;  Surgeon: Jene Every, MD;  Location: [facility] ORS;  Service: Orthopedics;  Laterality: Right;  90 mins   TUBAL LIGATION     WISDOM TOOTH EXTRACTION      Family History  Problem Relation Age of Onset   Cancer Mother 58       breast cancer  Hypertension Mother    Diabetes Father    Cancer Sister        breast cancer   Breast cancer Sister    Cancer Sister        breast cancer   Breast cancer Sister     Social History   Social History Narrative   Marital status: divorced; dating seriously x many years.       Children:  2 children; 1 grandchild.      Employment: Research officer, political party.      Lives: alone      Tobacco: none       Alcohol: none      Exercise:  Walking with sister two days per week.      Seatbelt: 100%      Guns: none      Right-handed   Caffeine: occasional coffee, hot tea or Psalm Arman tea a few times per week     Review of Systems General: Denies fevers or chills Skin: Denies any obvious drainage   Physical Exam    01/31/2023    9:54 AM 01/22/2023    3:04 PM 12/31/2022    3:51 PM  Vitals with BMI  Height 5' 7.5"    Weight 181 lbs 2 oz 179 lbs 10 oz   BMI 27.93    Systolic 119 120 025  Diastolic 60 68 76  Pulse 82 74 85    General:  No acute distress, nontoxic appearing  Respiratory: No increased work of breathing Neuro: Alert and oriented Psychiatric: Normal mood and affect  Skin: 3 mm incisional opening at left mastectomy site.  No surrounding erythema.  No expressible drainage.  Remainder of vasectomy incision well-healed.  Assessment/Plan  S/p left mastectomy scar revision:  Unfortunately the patient still has a 3 mm incisional opening at the area of concern on left side mastectomy scar.  This is the same area where she had previously opened which then subsequently began draining leading to the scar revision.  Currently, there is no evidence concerning for infection.  It is not draining,  erythematous, swollen, or tender.  However, unless it granulates and heal from the bottom up, suspect that the problem may recur.  This is likely due to the flaps being thin as well as the area being radiated.  She may require tissue transfer/flap for more definitive intervention. This patient is also scheduled to begin chemotherapy which may further contribute to poor wound healing.  Picture obtained and placed in chart.  Will discuss with surgeon.  Follow-up to be scheduled with Dr. Ladona Ridgel.  Evelena Leyden 02/04/2023, 8:58 AM

## 2023-01-31 NOTE — Assessment & Plan Note (Addendum)
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 Even though the cancer risk HER2 positive given her severe cardiomyopathy we were unable to treat her with anti-HER2 therapy.  Signatera: 01/02/2023: Positive 0.07 MTM/ml  Dr. Gala Romney performed extensive workup and felt that the treatment would be reasonable if we felt that her risk of dying from breast cancer is significantly high. I discussed this with the patient that because of [place] N[place] is positive it is predicting that she may have minimal residual disease and therefore I recommended that we treat her with Herceptin for a few rounds.  Patient has a PET scan showing increased activity in the sternum with lucency and sclerosis.  We will plan to do CT scans in June 2024.  Return to clinic next week to start Herceptin injections

## 2023-01-31 NOTE — Progress Notes (Signed)
OFF PATHWAY REGIMEN - Breast  No Change  Continue With Treatment as Ordered.  Original Decision Date/Time: 10/24/2021 08:58   [id number]:Trastuzumab and hyaluronidase-oysk 600 mg/10,000 units SUBQ D1 q21 Days:   A cycle is every 21 days:     Trastuzumab and hyaluronidase-oysk   **Always confirm dose/schedule in your pharmacy ordering system**  Patient Characteristics: Postoperative without Neoadjuvant Therapy (Pathologic Staging), Invasive Disease, Adjuvant Therapy, HER2 Positive, ER Positive, Node Negative, pT1c, pN0/N68mi Therapeutic Status: Postoperative without Neoadjuvant Therapy (Pathologic Staging) AJCC Grade: G2 AJCC N Category: pN0 AJCC M Category: cM0 ER Status: Positive (+) AJCC 8 Stage Grouping: IA HER2 Status: Positive (+) Oncotype Dx Recurrence Score: Not Appropriate AJCC T Category: pT1c PR Status: Negative (-) Adjuvant Therapy Status: No Adjuvant Therapy Received Yet or Changing Initial Adjuvant Regimen due to Tolerance Intent of Therapy: Curative Intent, Discussed with Patient

## 2023-02-01 ENCOUNTER — Telehealth (HOSPITAL_COMMUNITY): Payer: Self-pay | Admitting: Cardiology

## 2023-02-01 ENCOUNTER — Other Ambulatory Visit: Payer: Self-pay

## 2023-02-01 NOTE — Telephone Encounter (Signed)
Order for echo placed  Attempted to contact patient to check availability  [facility]

## 2023-02-01 NOTE — Telephone Encounter (Signed)
-----   Message from Dolores Patty, MD sent at 01/31/2023  2:55 PM EDT ----- Regarding: Appointment  Hey. Can you make sure she has an appt with me in 2-3 months with echo?  Thanks.

## 2023-02-04 ENCOUNTER — Ambulatory Visit (INDEPENDENT_AMBULATORY_CARE_PROVIDER_SITE_OTHER): Payer: 59 | Admitting: Physician Assistant

## 2023-02-04 ENCOUNTER — Encounter: Payer: Self-pay | Admitting: General Practice

## 2023-02-04 VITALS — BP 122/66 | HR 82 | Resp 18

## 2023-02-04 DIAGNOSIS — T8131XD Disruption of external operation (surgical) wound, not elsewhere classified, subsequent encounter: Secondary | ICD-10-CM | POA: Diagnosis not present

## 2023-02-04 DIAGNOSIS — Z9889 Other specified postprocedural states: Secondary | ICD-10-CM

## 2023-02-04 DIAGNOSIS — Z923 Personal history of irradiation: Secondary | ICD-10-CM | POA: Diagnosis not present

## 2023-02-04 NOTE — Progress Notes (Signed)
[facility] Spiritual Care Note  Received and returned voicemail from Ms Bulls re referral from Counseling Intern Colleen Pesci. Left direct number, encouraging return call.   [address] [place], [place], [facility] Pager [phone] Voicemail [phone]

## 2023-02-05 ENCOUNTER — Encounter: Payer: Self-pay | Admitting: Hematology and Oncology

## 2023-02-06 ENCOUNTER — Other Ambulatory Visit: Payer: Self-pay

## 2023-02-06 ENCOUNTER — Inpatient Hospital Stay: Payer: 59

## 2023-02-06 VITALS — BP 135/55 | HR 64 | Temp 97.9°F | Resp 16

## 2023-02-06 DIAGNOSIS — Z853 Personal history of malignant neoplasm of breast: Secondary | ICD-10-CM | POA: Diagnosis not present

## 2023-02-06 DIAGNOSIS — Z923 Personal history of irradiation: Secondary | ICD-10-CM | POA: Diagnosis not present

## 2023-02-06 DIAGNOSIS — Z9013 Acquired absence of bilateral breasts and nipples: Secondary | ICD-10-CM | POA: Diagnosis not present

## 2023-02-06 DIAGNOSIS — Z803 Family history of malignant neoplasm of breast: Secondary | ICD-10-CM | POA: Diagnosis not present

## 2023-02-06 DIAGNOSIS — C50412 Malignant neoplasm of upper-outer quadrant of left female breast: Secondary | ICD-10-CM | POA: Diagnosis not present

## 2023-02-06 DIAGNOSIS — Z171 Estrogen receptor negative status [ER-]: Secondary | ICD-10-CM

## 2023-02-06 DIAGNOSIS — Z79811 Long term (current) use of aromatase inhibitors: Secondary | ICD-10-CM | POA: Diagnosis not present

## 2023-02-06 MED ORDER — ACETAMINOPHEN 325 MG PO TABS
650.0000 mg | ORAL_TABLET | Freq: Once | ORAL | Status: AC
  Administered 2023-02-06: 650 mg via ORAL
  Filled 2023-02-06: qty 2

## 2023-02-06 MED ORDER — DIPHENHYDRAMINE HCL 25 MG PO CAPS
25.0000 mg | ORAL_CAPSULE | Freq: Once | ORAL | Status: AC
  Administered 2023-02-06: 25 mg via ORAL
  Filled 2023-02-06: qty 1

## 2023-02-06 MED ORDER — TRASTUZUMAB-HYALURONIDASE-OYSK 600-10000 MG-UNT/5ML ~~LOC~~ SOLN
600.0000 mg | Freq: Once | SUBCUTANEOUS | Status: AC
  Administered 2023-02-06: 600 mg via SUBCUTANEOUS
  Filled 2023-02-06: qty 5

## 2023-02-06 NOTE — Patient Instructions (Signed)
[facility]  Discharge Instructions: Thank you for choosing [facility] to provide your oncology and hematology care.   If you have a lab appointment with the Cancer Center, please go directly to the Cancer Center and check in at the registration area.   Wear comfortable clothing and clothing appropriate for easy access to any Portacath or PICC line.   We strive to give you quality time with your provider. You may need to reschedule your appointment if you arrive late (15 or more minutes).  Arriving late affects you and other patients whose appointments are after yours.  Also, if you miss three or more appointments without notifying the office, you may be dismissed from the clinic at the provider's discretion.      For prescription refill requests, have your pharmacy contact our office and allow 72 hours for refills to be completed.    Today you received the following chemotherapy and/or immunotherapy agents: Herceptin Hylecta      To help prevent nausea and vomiting after your treatment, we encourage you to take your nausea medication as directed.  BELOW ARE SYMPTOMS THAT SHOULD BE REPORTED IMMEDIATELY: *FEVER GREATER THAN 100.4 F (38 C) OR HIGHER *CHILLS OR SWEATING *NAUSEA AND VOMITING THAT IS NOT CONTROLLED WITH YOUR NAUSEA MEDICATION *UNUSUAL SHORTNESS OF BREATH *UNUSUAL BRUISING OR BLEEDING *URINARY PROBLEMS (pain or burning when urinating, or frequent urination) *BOWEL PROBLEMS (unusual diarrhea, constipation, pain near the anus) TENDERNESS IN MOUTH AND THROAT WITH OR WITHOUT PRESENCE OF ULCERS (sore throat, sores in mouth, or a toothache) UNUSUAL RASH, SWELLING OR PAIN  UNUSUAL VAGINAL DISCHARGE OR ITCHING   Items with * indicate a potential emergency and should be followed up as soon as possible or go to the Emergency Department if any problems should occur.  Please show the CHEMOTHERAPY ALERT CARD or IMMUNOTHERAPY ALERT CARD  at check-in to the Emergency Department and triage nurse.  Should you have questions after your visit or need to cancel or reschedule your appointment, please contact [facility]  Dept: [phone]  and follow the prompts.  Office hours are 8:00 a.m. to 4:30 p.m. Monday - Friday. Please note that voicemails left after 4:00 p.m. may not be returned until the following business day.  We are closed weekends and major holidays. You have access to a nurse at all times for urgent questions. Please call the main number to the clinic Dept: [phone] and follow the prompts.   For any non-urgent questions, you may also contact your provider using MyChart. We now offer e-Visits for anyone 18 and older to request care online for non-urgent symptoms. For details visit [email or web address].   Also download the MyChart app! Go to the app store, search "MyChart", open the app, select , and log in with your MyChart username and password.   

## 2023-02-06 NOTE — Progress Notes (Signed)
Per Dr. Earmon Phoenix note from 01/31/23- ok to treat today with January's ECHO with an EF of 30-35%. Double checked with Dr. Pamelia Hoit- ok to treat.  Patient observed for 30 minutes- no major issues. Patient did have very minimal nausea at the end of the 30 minutes, but had not eaten a lot today. Has nausea meds at home and stated that she would take them when she got home. Patient advised to take it easy and rest. Patient agreed.

## 2023-02-06 NOTE — Telephone Encounter (Signed)
[facility] LETTER MAILED

## 2023-02-07 ENCOUNTER — Telehealth: Payer: Self-pay | Admitting: Internal Medicine

## 2023-02-07 ENCOUNTER — Encounter: Payer: Self-pay | Admitting: Internal Medicine

## 2023-02-07 NOTE — Telephone Encounter (Signed)
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 States that its causing her discomfort/pain. Please advise

## 2023-02-07 NOTE — Telephone Encounter (Signed)
Patient reports of pain at device site. Denies swelling, redness, drainage, bleeding, warmth, fever or chills. Patient advised to use ice and take tylenol as needed for pain. DC apt scheduled per pt request 02/14/23 @ 10:40 AM. Dr. Ladona Ridgel in office if needed. Advised to call if she has further questions or concerns.

## 2023-02-08 ENCOUNTER — Other Ambulatory Visit: Payer: Self-pay | Admitting: Cardiology

## 2023-02-12 ENCOUNTER — Telehealth: Payer: Self-pay | Admitting: *Deleted

## 2023-02-12 NOTE — Telephone Encounter (Signed)
Received call from pt with complaint of ongoing acid reflux and headache x 2 days.  Pt states she is not eating or drinking much due to the acid flux that is no alleviated with OTC tums. Per MD pt to take OTC Pepcid BID x 1 week and then daily after that for the duration of tx. Pt educated that headache could be contributed to dehydration due to decreased appetite while experiencing acid reflux.  Pt educated to start Pepcid and take OTC tylenol for headache relief.  Pt educated to contact our office if symptoms become worse or do not go away for [facility] visit. Pt verbalized understanding.

## 2023-02-14 ENCOUNTER — Ambulatory Visit: Payer: 59 | Attending: Cardiovascular Disease

## 2023-02-14 DIAGNOSIS — I447 Left bundle-branch block, unspecified: Secondary | ICD-10-CM | POA: Diagnosis not present

## 2023-02-14 DIAGNOSIS — I428 Other cardiomyopathies: Secondary | ICD-10-CM

## 2023-02-14 LAB — CUP PACEART INCLINIC DEVICE CHECK
Battery Remaining Longevity: 97 mo
Brady Statistic RA Percent Paced: 0 %
Brady Statistic RV Percent Paced: 0.94 %
Date Time Interrogation Session: 20240425203752
HighPow Impedance: 61.875
Implantable Lead Connection Status: 753985
Implantable Lead Connection Status: 753985
Implantable Lead Connection Status: 753985
Implantable Lead Implant Date: 20231103
Implantable Lead Implant Date: 20231103
Implantable Lead Implant Date: 20231103
Implantable Lead Location: 753858
Implantable Lead Location: 753859
Implantable Lead Location: 753860
Implantable Lead Model: 3830
Implantable Pulse Generator Implant Date: 20231103
Lead Channel Impedance Value: 275 Ohm
Lead Channel Impedance Value: 487.5 Ohm
Lead Channel Impedance Value: 512.5 Ohm
Lead Channel Pacing Threshold Amplitude: 0.5 V
Lead Channel Pacing Threshold Amplitude: 0.5 V
Lead Channel Pacing Threshold Amplitude: 1 V
Lead Channel Pacing Threshold Amplitude: 1 V
Lead Channel Pacing Threshold Pulse Width: 0.5 ms
Lead Channel Pacing Threshold Pulse Width: 0.5 ms
Lead Channel Pacing Threshold Pulse Width: 0.5 ms
Lead Channel Pacing Threshold Pulse Width: 0.5 ms
Lead Channel Sensing Intrinsic Amplitude: 11.7 mV
Lead Channel Sensing Intrinsic Amplitude: 4 mV
Lead Channel Setting Pacing Amplitude: 2.5 V
Lead Channel Setting Pacing Amplitude: 2.5 V
Lead Channel Setting Pacing Pulse Width: 0.5 ms
Lead Channel Setting Pacing Pulse Width: 0.5 ms
Lead Channel Setting Sensing Sensitivity: 0.5 mV
Pulse Gen Serial Number: 5548670
Zone Setting Status: 755011

## 2023-02-14 NOTE — Progress Notes (Signed)
ICD check in clinic. Patient in today complaining of pain at device site, episode of fast heart rate waking her up last Thursday and concern for lead malfunction.  She was falsely under the impression that her Left Bundle lead had dislodged and she didn't have pacing support at all.  Device interrogation performed. Normal device function. Thresholds and sensing consistent with previous device measurements. Impedance trends stable over time. No mode switches. No ventricular arrhythmias. Histogram distribution appropriate for patient and level of activity. No changes made this session. Device programmed at appropriate safety margins. Device programmed to optimize intrinsic conduction. Estimated longevity 6-8 years. She is Bi-V pacing effectively <1% due to inability of the Left bundle lead to capture beyond the septum to promote best electrical conduction.   Dr. Ladona Ridgel reviewed all concerns with patient.  He informed patient that her leads were working and she does have pacing support.  He clarified for her that her LBundle lead did not dislodge, it is in place and working but not appropriately narrowing her QRS as was the intent.  He reviewed potential options for next steps and will discuss with Dr. Gala Romney how to best proceed and follow up with patient.  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  Tylenol/Advil use as well as ice packs prn for the discomfort were discussed.    Plan is to follow up with patient after further discussion with Dr. Gala Romney.  Patient has next follow up in office with Dr. Ladona Ridgel currently scheduled for June.

## 2023-02-17 ENCOUNTER — Other Ambulatory Visit: Payer: Self-pay | Admitting: Cardiology

## 2023-02-17 ENCOUNTER — Other Ambulatory Visit: Payer: Self-pay | Admitting: Hematology and Oncology

## 2023-02-17 DIAGNOSIS — I428 Other cardiomyopathies: Secondary | ICD-10-CM

## 2023-02-21 DIAGNOSIS — Z0001 Encounter for general adult medical examination with abnormal findings: Secondary | ICD-10-CM | POA: Diagnosis not present

## 2023-02-21 DIAGNOSIS — E7800 Pure hypercholesterolemia, unspecified: Secondary | ICD-10-CM | POA: Diagnosis not present

## 2023-02-26 ENCOUNTER — Ambulatory Visit (INDEPENDENT_AMBULATORY_CARE_PROVIDER_SITE_OTHER): Payer: 59

## 2023-02-26 DIAGNOSIS — E059 Thyrotoxicosis, 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: Secondary | ICD-10-CM | POA: Diagnosis not present

## 2023-02-26 DIAGNOSIS — I447 Left bundle-branch block, unspecified: Secondary | ICD-10-CM | POA: Diagnosis not present

## 2023-02-26 DIAGNOSIS — Z0000 Encounter for general adult medical examination without abnormal findings: Secondary | ICD-10-CM | POA: Diagnosis not present

## 2023-02-26 DIAGNOSIS — R748 Abnormal levels of other serum enzymes: Secondary | ICD-10-CM | POA: Diagnosis not present

## 2023-02-26 LAB — CUP PACEART REMOTE DEVICE CHECK
Battery Remaining Longevity: 85 mo
Battery Remaining Percentage: 87 %
Battery Voltage: 3.08 V
Date Time Interrogation Session: 20240507020017
HighPow Impedance: 53 Ohm
HighPow Impedance: 53 Ohm
Implantable Lead Connection Status: 753985
Implantable Lead Connection Status: 753985
Implantable Lead Connection Status: 753985
Implantable Lead Implant Date: 20231103
Implantable Lead Implant Date: 20231103
Implantable Lead Implant Date: 20231103
Implantable Lead Location: 753858
Implantable Lead Location: 753859
Implantable Lead Location: 753860
Implantable Lead Model: 3830
Implantable Pulse Generator Implant Date: 20231103
Lead Channel Impedance Value: 260 Ohm
Lead Channel Impedance Value: 440 Ohm
Lead Channel Impedance Value: 450 Ohm
Lead Channel Pacing Threshold Amplitude: 0.5 V
Lead Channel Pacing Threshold Amplitude: 1 V
Lead Channel Pacing Threshold Pulse Width: 0.5 ms
Lead Channel Pacing Threshold Pulse Width: 0.5 ms
Lead Channel Sensing Intrinsic Amplitude: 11.6 mV
Lead Channel Sensing Intrinsic Amplitude: 2.5 mV
Lead Channel Setting Pacing Amplitude: 2.5 V
Lead Channel Setting Pacing Amplitude: 2.5 V
Lead Channel Setting Pacing Pulse Width: 0.5 ms
Lead Channel Setting Pacing Pulse Width: 0.5 ms
Lead Channel Setting Sensing Sensitivity: 0.5 mV
Pulse Gen Serial Number: 5548670
Zone Setting Status: 755011

## 2023-02-28 ENCOUNTER — Inpatient Hospital Stay: Payer: 59

## 2023-02-28 ENCOUNTER — Inpatient Hospital Stay: Payer: 59 | Attending: Hematology and Oncology

## 2023-02-28 ENCOUNTER — Other Ambulatory Visit: Payer: Self-pay

## 2023-02-28 VITALS — BP 132/66 | HR 64 | Temp 98.2°F | Resp 16

## 2023-02-28 DIAGNOSIS — Z79811 Long term (current) use of aromatase inhibitors: Secondary | ICD-10-CM | POA: Diagnosis not present

## 2023-02-28 DIAGNOSIS — Z1501 Genetic susceptibility to malignant neoplasm of breast: Secondary | ICD-10-CM | POA: Diagnosis not present

## 2023-02-28 DIAGNOSIS — Z853 Personal history of malignant neoplasm of breast: Secondary | ICD-10-CM | POA: Insufficient documentation

## 2023-02-28 DIAGNOSIS — Z803 Family history of malignant neoplasm of breast: Secondary | ICD-10-CM | POA: Diagnosis not present

## 2023-02-28 DIAGNOSIS — Z923 Personal history of irradiation: Secondary | ICD-10-CM | POA: Diagnosis not present

## 2023-02-28 DIAGNOSIS — C50412 Malignant neoplasm of upper-outer quadrant of left female breast: Secondary | ICD-10-CM | POA: Insufficient documentation

## 2023-02-28 DIAGNOSIS — Z9013 Acquired absence of bilateral breasts and nipples: Secondary | ICD-10-CM | POA: Diagnosis not present

## 2023-02-28 DIAGNOSIS — Z171 Estrogen receptor negative status [ER-]: Secondary | ICD-10-CM | POA: Diagnosis not present

## 2023-02-28 DIAGNOSIS — Z1509 Genetic susceptibility to other malignant neoplasm: Secondary | ICD-10-CM | POA: Diagnosis not present

## 2023-02-28 MED ORDER — ACETAMINOPHEN 325 MG PO TABS
650.0000 mg | ORAL_TABLET | Freq: Once | ORAL | Status: AC
  Administered 2023-02-28: 650 mg via ORAL
  Filled 2023-02-28: qty 2

## 2023-02-28 MED ORDER — DIPHENHYDRAMINE HCL 25 MG PO CAPS
25.0000 mg | ORAL_CAPSULE | Freq: Once | ORAL | Status: AC
  Administered 2023-02-28: 25 mg via ORAL
  Filled 2023-02-28: qty 1

## 2023-02-28 MED ORDER — TRASTUZUMAB-HYALURONIDASE-OYSK 600-10000 MG-UNT/5ML ~~LOC~~ SOLN
600.0000 mg | Freq: Once | SUBCUTANEOUS | Status: AC
  Administered 2023-02-28: 600 mg via SUBCUTANEOUS
  Filled 2023-02-28: qty 5

## 2023-02-28 NOTE — Patient Instructions (Signed)
[facility]  Discharge Instructions: Thank you for choosing [facility] to provide your oncology and hematology care.   If you have a lab appointment with the Cancer Center, please go directly to the Cancer Center and check in at the registration area.   Wear comfortable clothing and clothing appropriate for easy access to any Portacath or PICC line.   We strive to give you quality time with your provider. You may need to reschedule your appointment if you arrive late (15 or more minutes).  Arriving late affects you and other patients whose appointments are after yours.  Also, if you miss three or more appointments without notifying the office, you may be dismissed from the clinic at the provider's discretion.      For prescription refill requests, have your pharmacy contact our office and allow 72 hours for refills to be completed.    Today you received the following chemotherapy and/or immunotherapy agents: Herceptin Hylecta      To help prevent nausea and vomiting after your treatment, we encourage you to take your nausea medication as directed.  BELOW ARE SYMPTOMS THAT SHOULD BE REPORTED IMMEDIATELY: *FEVER GREATER THAN 100.4 F (38 C) OR HIGHER *CHILLS OR SWEATING *NAUSEA AND VOMITING THAT IS NOT CONTROLLED WITH YOUR NAUSEA MEDICATION *UNUSUAL SHORTNESS OF BREATH *UNUSUAL BRUISING OR BLEEDING *URINARY PROBLEMS (pain or burning when urinating, or frequent urination) *BOWEL PROBLEMS (unusual diarrhea, constipation, pain near the anus) TENDERNESS IN MOUTH AND THROAT WITH OR WITHOUT PRESENCE OF ULCERS (sore throat, sores in mouth, or a toothache) UNUSUAL RASH, SWELLING OR PAIN  UNUSUAL VAGINAL DISCHARGE OR ITCHING   Items with * indicate a potential emergency and should be followed up as soon as possible or go to the Emergency Department if any problems should occur.  Please show the CHEMOTHERAPY ALERT CARD or IMMUNOTHERAPY ALERT CARD  at check-in to the Emergency Department and triage nurse.  Should you have questions after your visit or need to cancel or reschedule your appointment, please contact [facility]  Dept: [phone]  and follow the prompts.  Office hours are 8:00 a.m. to 4:30 p.m. Monday - Friday. Please note that voicemails left after 4:00 p.m. may not be returned until the following business day.  We are closed weekends and major holidays. You have access to a nurse at all times for urgent questions. Please call the main number to the clinic Dept: [phone] and follow the prompts.   For any non-urgent questions, you may also contact your provider using MyChart. We now offer e-Visits for anyone 18 and older to request care online for non-urgent symptoms. For details visit [email or web address].   Also download the MyChart app! Go to the app store, search "MyChart", open the app, select , and log in with your MyChart username and password.   

## 2023-03-01 DIAGNOSIS — E118 Type 2 diabetes mellitus with unspecified complications: Secondary | ICD-10-CM | POA: Diagnosis not present

## 2023-03-01 DIAGNOSIS — E059 Thyrotoxicosis, 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: Secondary | ICD-10-CM | POA: Diagnosis not present

## 2023-03-01 DIAGNOSIS — E042 Nontoxic multinodular goiter: Secondary | ICD-10-CM | POA: Diagnosis not present

## 2023-03-08 ENCOUNTER — Encounter: Payer: Self-pay | Admitting: Hematology and Oncology

## 2023-03-15 ENCOUNTER — Ambulatory Visit: Payer: 59 | Admitting: Cardiology

## 2023-03-15 ENCOUNTER — Encounter: Payer: Self-pay | Admitting: Cardiology

## 2023-03-15 VITALS — BP 138/73 | HR 84 | Resp 16 | Ht 67.5 in | Wt 185.0 lb

## 2023-03-15 DIAGNOSIS — E782 Mixed hyperlipidemia: Secondary | ICD-10-CM | POA: Diagnosis not present

## 2023-03-15 DIAGNOSIS — Z95810 Presence of automatic (implantable) cardiac defibrillator: Secondary | ICD-10-CM | POA: Diagnosis not present

## 2023-03-15 DIAGNOSIS — I10 Essential (primary) hypertension: Secondary | ICD-10-CM

## 2023-03-15 DIAGNOSIS — I5042 Chronic combined systolic (congestive) and diastolic (congestive) heart failure: Secondary | ICD-10-CM

## 2023-03-15 DIAGNOSIS — I428 Other cardiomyopathies: Secondary | ICD-10-CM

## 2023-03-15 DIAGNOSIS — Z79899 Other long term (current) drug therapy: Secondary | ICD-10-CM | POA: Diagnosis not present

## 2023-03-15 DIAGNOSIS — E119 Type 2 diabetes mellitus without complications: Secondary | ICD-10-CM

## 2023-03-15 DIAGNOSIS — I447 Left bundle-branch block, unspecified: Secondary | ICD-10-CM | POA: Diagnosis not present

## 2023-03-15 NOTE — Progress Notes (Signed)
Jordan Willis Date of Birth: 02-14-1951 MRN: [id number] Primary Care Provider:Pharr, Zollie Beckers, MD  Former Cardiology Providers: Toniann Fail, MSN, APRN, FNP-C Primary Cardiologist: Tessa Lerner, DO (established care 01/20/2020) Advanced heart failure specialist: Dr. Gala Romney Cardiac electrophysiologist: Dr. Ladona Ridgel  Date: 03/15/23 Last Office Visit: 12/13/2022  Chief Complaint  Patient presents with   Follow-up    Cardiomyopathy, HFrEF   HPI  Jordan Willis is a 72 y.o.  female whose past medical history and cardiovascular risk factors include: Left bundle branch block, type 2 diabetes mellitus, hyperlipidemia, nonischemic cardiomyopathy, chronic combined systolic/diastolic heart failure, recurrent breast cancer status post bilateral mastectomies, postmenopausal female, advanced age.  Patient presents today accompanied by her son and daughter who provide collateral history as part of today's encounter.  Back in June 2022 she had low normal LVEF and after recurrence of breast cancer she was placed on Herceptin for approximately 3 months  Jan 2023-April 2023.  LVEF in April 2023 was noted to be 35-40% and Herceptin was discontinued.  Despite stopping Herceptin her LVEF has not improved.  She has been on maximally tolerated GDMT and also underwent BiV ICD placement in November 2023.  She had a repeat echocardiogram in January 2024 without any significant improvement in LVEF. She follows Dr. Pamelia Hoit from hematology/oncology and wanted to consider a trial of anti-HER2 therapy ([place]cyla).    However given her nonischemic cardiomyopathy and a reduced LVEF she would be at high risk of cardiotoxicity with a trial of anti-HER2 therapy.  I referred her to advanced heart failure, Dr. Gala Romney, for reconsideration chemotherapy in the setting of recurrent/advanced breast cancer and reduced LVEF.  Dr. Gala Romney was kind enough to see the patient and has had discussions with Dr. Pamelia Hoit. Consult and  progress notes reviewed.  Because her R[place] Nail was positive the shared decision between the patient/family and her providers was to treat her with Herceptin.  Patient will have repeat echocardiogram and follow-up with Dr. Gala Romney in June.  Since initiation of Herceptin patient states that she is doing well.  She denies orthopnea, PND, or lower extremity swelling.  She denies anginal chest pain.  FUNCTIONAL STATUS: No structured exercise program or daily routine.  ALLERGIES: Allergies  Allergen Reactions   Bactrim [Sulfamethoxazole-Trimethoprim] Shortness Of Breath    Shortness of breath, difficulty breathing, headache, rash, fatigue   Aspirin Palpitations and Other (See Comments)    Heart fluttering, pt takes 40.5 mg (half of 81 mg) daily Other reaction(s): bradycardia, Not available   Sulfa Antibiotics Hives   Tape Rash    Tears skin off   MEDICATION LIST PRIOR TO VISIT: Current Outpatient Medications on File Prior to Visit  Medication Sig Dispense Refill   albuterol (VENTOLIN HFA) 108 (90 Base) MCG/ACT inhaler Inhale 2 puffs into the lungs every 6 (six) hours as needed for wheezing or shortness of breath.     b complex vitamins capsule Take 1 capsule by mouth daily.     baclofen (LIORESAL) 10 MG tablet Take 10 mg by mouth 3 (three) times daily.     bumetanide (BUMEX) 1 MG tablet TAKE 1 TABLET BY MOUTH EVERY DAY 90 tablet 1   cetirizine (ZYRTEC) 10 MG tablet Take 10 mg by mouth in the morning.     Cholecalciferol (VITAMIN D3) 25 MCG (1000 UT) CAPS Take 1,000 Units by mouth in the morning.     diclofenac Sodium (VOLTAREN) 1 % GEL Apply 1 Application topically 4 (four) times daily as needed (pain).  exemestane (AROMASIN) 25 MG tablet Take 1 tablet (25 mg total) by mouth daily after breakfast. 90 tablet 3   FARXIGA 10 MG TABS tablet TAKE 1 TABLET BY MOUTH EVERY DAY BEFORE BREAKFAST 90 tablet 0   fluticasone (FLONASE) 50 MCG/ACT nasal spray Place 1 spray into both nostrils in the  morning.     GLUCOSAMINE-CHONDROITIN PO Take 1 tablet by mouth every evening.      glucose blood (ONETOUCH VERIO) test strip TEST ONCE DAILY AS DIRECTED 90     ibuprofen (ADVIL) 200 MG tablet Take 400 mg by mouth every 6 (six) hours as needed for mild pain or moderate pain.     metFORMIN (GLUCOPHAGE-XR) 500 MG 24 hr tablet Take 500 mg by mouth 2 (two) times daily.     metoprolol succinate (TOPROL-XL) 25 MG 24 hr tablet TAKE 1 TABLET (25 MG TOTAL) BY MOUTH IN THE MORNING. HOLD IF SYSTOLIC BLOOD PRESSURE (TOP BLOOD PRESSURE NUMBER) LESS THAN 100 MMHG OR HEART RATE LESS THAN 60 BPM (PULSE). 90 tablet 3   Multiple Vitamin (MULTIVITAMIN WITH MINERALS) TABS tablet Take 1 tablet by mouth daily. Centrum Silver for Women 50+     nitroGLYCERIN (NITROSTAT) 0.4 MG SL tablet Place 0.4 mg under the tongue every 5 (five) minutes as needed for chest pain.     Olopatadine HCl 0.2 % SOLN Place 1 drop into both eyes daily. Pataday     ondansetron (ZOFRAN) 4 MG tablet Take 1 tablet (4 mg total) by mouth every 6 (six) hours as needed for nausea or vomiting. 20 tablet 0   OneTouch Delica Lancets 33G MISC Apply topically.     oxyCODONE (OXY IR/ROXICODONE) 5 MG immediate release tablet Take 1 tablet (5 mg total) by mouth every 6 (six) hours as needed for severe pain or moderate pain. 30 tablet 0   Probiotic Product (PROBIOTIC DAILY PO) Take 1 capsule by mouth in the morning.     rosuvastatin (CRESTOR) 20 MG tablet Take 20 mg by mouth at bedtime.     sacubitril-valsartan (ENTRESTO) 24-26 MG Take 1 tablet by mouth 2 (two) times daily. 60 tablet 6   sertraline (ZOLOFT) 50 MG tablet Take 50 mg by mouth in the morning.     spironolactone (ALDACTONE) 25 MG tablet TAKE 1 TABLET BY MOUTH EVERY DAY IN THE MORNING 90 tablet 0   No current facility-administered medications on file prior to visit.    PAST MEDICAL HISTORY: Past Medical History:  Diagnosis Date   AICD (automatic cardioverter/defibrillator) present    Anemia     Anginal pain (HCC)    Anxiety    Arthritis    Lumbar spine DDD.   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   Diabetes mellitus without complication (HCC)    Type II   Dyspnea    When patient gets sick uses Pro-Air inhaler   Frequent sinus infections    GERD (gastroesophageal reflux disease)    occ   Goiter    History of cardiomegaly    History of cataract    Bilateral   History of kidney stones    08/28/2018 currently has a large kidney stone, asymptomatic at this time   History of migraine    History of uterine fibroid    History of uterine prolapse  Hyperlipidemia    Hypertension    LBBB (left bundle branch block)    Menopause 03/03/2012   Nasal turbinate hypertrophy 11/29/2016   Left inferior    Personal history of chemotherapy    Personal history of radiation therapy    Pneumonia    as a child   PONV (postoperative nausea and vomiting)    Sinusitis 2014   Sleep apnea    No CPAP   SUI (stress urinary incontinence, female)    SVD (spontaneous vaginal delivery)    x 2   Thyroid nodule     PAST SURGICAL HISTORY: Past Surgical History:  Procedure Laterality Date   ABDOMINAL HYSTERECTOMY     BILATERAL SALPINGOOPHORECTOMY Bilateral    BIV ICD INSERTION CRT-D N/A 08/24/2022   Procedure: BIV ICD INSERTION CRT-D;  Surgeon: Marinus Maw, MD;  Location: [facility] INVASIVE CV LAB;  Service: Cardiovascular;  Laterality: N/A;   BLADDER SUSPENSION N/A 05/16/2017   Procedure: TRANSVAGINAL TAPE (TVT) PROCEDURE;  Surgeon: Osborn Coho, MD;  Location: [facility] ORS;  Service: Gynecology;  Laterality: N/A;   BREAST BIOPSY Right 2009   BREAST IMPLANT REMOVAL Bilateral 02/05/2022   Procedure: Removal bilateral breast implants;  Surgeon: Allena Napoleon, MD;  Location: [facility] OR;  Service: Plastics;  Laterality:  Bilateral;   BREAST LUMPECTOMY Right 2009   BREAST LUMPECTOMY Left 2006   BREAST RECONSTRUCTION WITH PLACEMENT OF TISSUE EXPANDER AND FLEX HD (ACELLULAR HYDRATED DERMIS) Bilateral 09/04/2021   Procedure: BILATERAL BREAST RECONSTRUCTION WITH PLACEMENT OF TISSUE EXPANDER AND FLEX HD (ACELLULAR HYDRATED DERMIS);  Surgeon: Allena Napoleon, MD;  Location: [facility] OR;  Service: Plastics;  Laterality: Bilateral;   CARDIAC CATHETERIZATION     CATARACT EXTRACTION Left 10/23/2017   CATARACT EXTRACTION Right 11/06/2017   COLONOSCOPY  10/22/2009   Normal.  Repeat 5 years.  [name]   COLONOSCOPY WITH PROPOFOL N/A 12/21/2022   Procedure: COLONOSCOPY WITH PROPOFOL;  Surgeon: Jeani Hawking, MD;  Location: [facility] ENDOSCOPY;  Service: Gastroenterology;  Laterality: N/A;   CYSTOSCOPY N/A 05/16/2017   Procedure: CYSTOSCOPY;  Surgeon: Osborn Coho, MD;  Location: [facility] ORS;  Service: Gynecology;  Laterality: N/A;   DEBRIDEMENT AND CLOSURE WOUND Bilateral 10/03/2021   Procedure: Debridement bilateral mastectomy flaps;  Surgeon: Allena Napoleon, MD;  Location: [facility] OR;  Service: Plastics;  Laterality: Bilateral;  1.5 hour   EYE SURGERY     bilateral cataracts   LEFT HEART CATH AND CORONARY ANGIOGRAPHY N/A 02/09/2020   Procedure: LEFT HEART CATH AND CORONARY ANGIOGRAPHY;  Surgeon: Elder Negus, MD;  Location: [facility] INVASIVE CV LAB;  Service: Cardiovascular;  Laterality: N/A;   LEFT HEART CATH AND CORONARY ANGIOGRAPHY N/A 03/27/2022   Procedure: LEFT HEART CATH AND CORONARY ANGIOGRAPHY;  Surgeon: Yates Decamp, MD;  Location: [facility] INVASIVE CV LAB;  Service: Cardiovascular;  Laterality: N/A;   MASS EXCISION Right 12/04/2022   Procedure: EXCISION OF RIGHT CHEST WALL MASS;  Surgeon: Almond Lint, MD;  Location: [facility] OR;  Service: General;  Laterality: Right;   MASTECTOMY W/ SENTINEL NODE BIOPSY Right 09/04/2021   Procedure: BILATERAL MASTECTOMIES WITH RIGHT SENTINEL LYMPH NODE BIOPSY;  Surgeon: Almond Lint, MD;  Location: [facility] OR;   Service: General;  Laterality: Right;   REMOVAL OF BILATERAL TISSUE EXPANDERS WITH PLACEMENT OF BILATERAL BREAST IMPLANTS Bilateral 10/03/2021   Procedure: REMOVAL OF BILATERAL TISSUE EXPANDERS WITH REPLACEMENT OF TISSUE EXPANDERS;  Surgeon: Allena Napoleon, MD;  Location: [facility] OR;  Service: Plastics;  Laterality: Bilateral;   REMOVAL OF BILATERAL  TISSUE EXPANDERS WITH PLACEMENT OF BILATERAL BREAST IMPLANTS Bilateral 12/19/2021   Procedure: REMOVAL OF BILATERAL TISSUE EXPANDERS WITH PLACEMENT OF BILATERAL BREAST IMPLANTS;  Surgeon: Allena Napoleon, MD;  Location: [facility] OR;  Service: Plastics;  Laterality: Bilateral;   ROBOTIC ASSISTED TOTAL HYSTERECTOMY N/A 05/16/2017   Procedure: ROBOTIC ASSISTED TOTAL HYSTERECTOMY;  Surgeon: Silverio Lay, MD;  Location: [facility] ORS;  Service: Gynecology;  Laterality: N/A;   ROTATOR CUFF REPAIR Bilateral    SCAR REVISION N/A 12/04/2022   Procedure: REVISION OF MASTECTOMY SCAR;  Surgeon: Almond Lint, MD;  Location: [facility] OR;  Service: General;  Laterality: N/A;   SHOULDER ARTHROSCOPY WITH ROTATOR CUFF REPAIR AND SUBACROMIAL DECOMPRESSION Right 09/03/2018   Procedure: Right shoulder manipulation under anesthesia, exam under anesthesia, mini open rotator cuff repair, subacromial decompression;  Surgeon: Jene Every, MD;  Location: [facility] ORS;  Service: Orthopedics;  Laterality: Right;  90 mins   TUBAL LIGATION     WISDOM TOOTH EXTRACTION      FAMILY HISTORY: The patient's family history includes Breast cancer in her sister and sister; Cancer in her sister and sister; Cancer (age of onset: 64) in her mother; Diabetes in her father; Hypertension in her mother.   SOCIAL HISTORY:  The patient  reports that she has never smoked. She has never used smokeless tobacco. She reports that she does not drink alcohol and does not use drugs.  Review of Systems  Cardiovascular:  Positive for dyspnea on exertion (Chronic and stable). Negative for chest pain, leg swelling, orthopnea,  palpitations, paroxysmal nocturnal dyspnea and syncope.  Respiratory:  Negative for cough and shortness of breath.     PHYSICAL EXAM:    03/15/2023   10:48 AM 02/28/2023   10:37 AM 02/28/2023    9:40 AM  Vitals with BMI  Height 5' 7.5"    Weight 185 lbs    BMI 28.53    Systolic 138 132 829  Diastolic 73 66 62  Pulse 84 64 72   Physical Exam  Constitutional: No distress.  Age appropriate, hemodynamically stable.   Neck: No JVD present.  Cardiovascular: Normal rate, regular rhythm, S1 normal, S2 normal, intact distal pulses and normal pulses. Exam reveals no gallop, no S3 and no S4.  No murmur heard. Pulmonary/Chest: Effort normal and breath sounds normal. No stridor. She has no wheezes. She has no rales.  Bilateral mastectomies.  Patient wearing a binder.  1 JP drain noted on the right lateral wall.  Abdominal: Soft. Bowel sounds are normal. She exhibits no distension. There is no abdominal tenderness.  Musculoskeletal:        General: No edema.     Cervical back: Neck supple.  Neurological: She is alert and oriented to person, place, and time. She has intact cranial nerves (2-12).  Skin: Skin is warm and moist.   CARDIAC DATABASE: EKG: 03/15/2023: Sinus rhythm, 78 bpm, LAE, LBBB, ST-T changes inferior and lateral leads likely secondary to LBBB underlying ischemia cannot be without.  Echocardiogram: 03/29/2021:  50-55%, G1DD, moderate MR, Moderate TR, PASP .   06/15/2021: LVEF 30-35%, severe hypokinesis of the anteroseptal and apex, moderate to severe LV dysfunction, grade 1 diastolic dysfunction, elevated LAP, global longitudinal strain -16.7%, mild to moderate Mr.   10/09/2021: 45-50%, no regional wall motion abnormalities, global longitudinal strain -13.2%, mild MR.   01/29/2022: 35-40%, average GLS -10.3%, grade 1 diastolic impairment, severe hypokinesis of the left ventricle with regional wall motion abnormalities, moderately dilated left atrium, posterior pericardial  effusion without tamponade,  moderate to severe MR, estimated RAP 8 mmHg.  10/29/2022  1. Left ventricular ejection fraction, by estimation, is 30 to 35%. The  left ventricle has moderately decreased function. The left ventricle  demonstrates regional wall motion abnormalities. Abnormal (paradoxical)  septal motion, consistent with left  bundle branch block      There is mild asymmetric left ventricular hypertrophy of the  basal-septal segment. Left ventricular diastolic parameters are consistent  with Grade I diastolic dysfunction (impaired relaxation).   2. Right ventricular systolic function is low normal. The right  ventricular size is normal. There is normal pulmonary artery systolic  pressure. The estimated right ventricular systolic pressure is 28.2 mmHg.   3. The mitral valve is normal in structure. Trivial mitral valve  regurgitation. No evidence of mitral stenosis.   4. Pulmonic valve regurgitation is moderate.   5. The aortic valve is tricuspid. There is mild calcification of the  aortic valve. Aortic valve regurgitation is not visualized. Aortic valve  sclerosis/calcification is present, without any evidence of aortic  stenosis.   6. The inferior vena cava is normal in size with greater than 50%  respiratory variability, suggesting right atrial pressure of 3 mmHg.   Stress Testing:  02/14/2018 exercise nuclear stress test at [facility]: Achieved 7 METS, normal blood pressure response to exercise, had exertional chest pain that improved/resolved during recovery, normal myocardial perfusion study no EKG abnormalities per report.  Heart Catheterization: Left Heart Catheterization 03/27/22:  LV: 118/3, EDP 11 mmHg.  Ao 128/56, mean 84 mmHg.  No pressure gradient across the aortic valve. LVEF 30 to 35% with global hypokinesis.  LV is mildly dilated. LM: Large caliber vessels, smooth and normal. LAD: Large vessel, gives origin to several small diagonals.  Smooth and  normal.  Mid to distal segment is tortuous and may suggest hypertensive heart disease. RI: Large vessel, gives origin to large lateral branches.  Smooth and normal. CX: Large vessel, gives origin to moderate-sized OM1, 2 and continues as OM 3.  Smooth and normal. RCA: Dominant, continues as a PDA.  Smooth and normal.  Impression: Findings consistent with nonischemic dilated cardiomyopathy.  Severe LV systolic dysfunction.  MUGA scan 05/10/2022: 1. Left ventricular ejection fraction is equal to 35.3%. 2. Moderate to marked septal hypokinesis. 3. Left ventricular dilatation.   LABORATORY DATA:    Latest Ref Rng & Units 11/26/2022    8:21 AM 10/25/2022    1:10 PM 09/19/2022    8:33 AM  CBC  WBC 4.0 - 10.5 K/uL 4.7  5.1  4.8   Hemoglobin 12.0 - 15.0 g/dL 16.1  09.6  04.5   Hematocrit 36.0 - 46.0 % 39.4  38.0  35.4   Platelets 150 - 400 K/uL 254  305  239        Latest Ref Rng & Units 11/26/2022    8:21 AM 09/19/2022    8:33 AM 08/01/2022    8:42 AM  CMP  Glucose 70 - 99 mg/dL 409  811  99   BUN 8 - 23 mg/dL 8  13  11    Creatinine 0.44 - 1.00 mg/dL 9.14  7.82  9.56   Sodium 135 - 145 mmol/L 137  135  139   Potassium 3.5 - 5.1 mmol/L 3.7  4.1  4.3   Chloride 98 - 111 mmol/L 102  98  101   CO2 22 - 32 mmol/L 23  24  26    Calcium 8.9 - 10.3 mg/dL 9.7  21.3  9.8     Lipid Panel: Outside records 06/24/2018: Total cholesterol 176, triglycerides 104, HDL 52, LDL 103  Lipid Panel  Lab Results  Component Value Date   CHOL 158 10/13/2020   HDL 44 10/13/2020   LDLCALC 94 10/13/2020   TRIG 107 10/13/2020   CHOLHDL 6.2 10/11/2013    Lab Results  Component Value Date   HGBA1C 6.1 (H) 11/26/2022   HGBA1C 7.2 (H) 08/01/2021   HGBA1C 6.1 (H) 08/28/2018   No components found for: "NTPROBNP" Lab Results  Component Value Date   TSH 0.599 10/11/2013   TSH 0.401 07/02/2012    Cardiac Panel (last 3 results) No results for input(s): "CKTOTAL", "CKMB", "TROPONINIHS", "RELINDX" in the  last 72 hours.  External Labs: Collected: 09/26/2021 available in Care Everywhere. Sodium 147, potassium 4.6, chloride 107, bicarb 26, BUN 9, creatinine 0.62 AST 25, ALT 39, alkaline phosphatase 88 EGFR 106 Hemoglobin A1c 7.1 TSH 0.52  Collected 02/16/2021: Total cholesterol 182, triglycerides 118, HDL 44, LDL 114, non-HDL 117   FINAL MEDICATION LIST END OF ENCOUNTER: No orders of the defined types were placed in this encounter.   Current Outpatient Medications:    albuterol (VENTOLIN HFA) 108 (90 Base) MCG/ACT inhaler, Inhale 2 puffs into the lungs every 6 (six) hours as needed for wheezing or shortness of breath., Disp: , Rfl:    b complex vitamins capsule, Take 1 capsule by mouth daily., Disp: , Rfl:    baclofen (LIORESAL) 10 MG tablet, Take 10 mg by mouth 3 (three) times daily., Disp: , Rfl:    bumetanide (BUMEX) 1 MG tablet, TAKE 1 TABLET BY MOUTH EVERY DAY, Disp: 90 tablet, Rfl: 1   cetirizine (ZYRTEC) 10 MG tablet, Take 10 mg by mouth in the morning., Disp: , Rfl:    Cholecalciferol (VITAMIN D3) 25 MCG (1000 UT) CAPS, Take 1,000 Units by mouth in the morning., Disp: , Rfl:    diclofenac Sodium (VOLTAREN) 1 % GEL, Apply 1 Application topically 4 (four) times daily as needed (pain)., Disp: , Rfl:    exemestane (AROMASIN) 25 MG tablet, Take 1 tablet (25 mg total) by mouth daily after breakfast., Disp: 90 tablet, Rfl: 3   FARXIGA 10 MG TABS tablet, TAKE 1 TABLET BY MOUTH EVERY DAY BEFORE BREAKFAST, Disp: 90 tablet, Rfl: 0   fluticasone (FLONASE) 50 MCG/ACT nasal spray, Place 1 spray into both nostrils in the morning., Disp: , Rfl:    GLUCOSAMINE-CHONDROITIN PO, Take 1 tablet by mouth every evening. , Disp: , Rfl:    glucose blood (ONETOUCH VERIO) test strip, TEST ONCE DAILY AS DIRECTED 90, Disp: , Rfl:    ibuprofen (ADVIL) 200 MG tablet, Take 400 mg by mouth every 6 (six) hours as needed for mild pain or moderate pain., Disp: , Rfl:    metFORMIN (GLUCOPHAGE-XR) 500 MG 24 hr tablet,  Take 500 mg by mouth 2 (two) times daily., Disp: , Rfl:    metoprolol succinate (TOPROL-XL) 25 MG 24 hr tablet, TAKE 1 TABLET (25 MG TOTAL) BY MOUTH IN THE MORNING. HOLD IF SYSTOLIC BLOOD PRESSURE (TOP BLOOD PRESSURE NUMBER) LESS THAN 100 MMHG OR HEART RATE LESS THAN 60 BPM (PULSE)., Disp: 90 tablet, Rfl: 3   Multiple Vitamin (MULTIVITAMIN WITH MINERALS) TABS tablet, Take 1 tablet by mouth daily. Centrum Silver for Women 50+, Disp: , Rfl:    nitroGLYCERIN (NITROSTAT) 0.4 MG SL tablet, Place 0.4 mg under the tongue every 5 (five) minutes as needed for chest pain., Disp: , Rfl:  Olopatadine HCl 0.2 % SOLN, Place 1 drop into both eyes daily. Pataday, Disp: , Rfl:    ondansetron (ZOFRAN) 4 MG tablet, Take 1 tablet (4 mg total) by mouth every 6 (six) hours as needed for nausea or vomiting., Disp: 20 tablet, Rfl: 0   OneTouch Delica Lancets 33G MISC, Apply topically., Disp: , Rfl:    oxyCODONE (OXY IR/ROXICODONE) 5 MG immediate release tablet, Take 1 tablet (5 mg total) by mouth every 6 (six) hours as needed for severe pain or moderate pain., Disp: 30 tablet, Rfl: 0   Probiotic Product (PROBIOTIC DAILY PO), Take 1 capsule by mouth in the morning., Disp: , Rfl:    rosuvastatin (CRESTOR) 20 MG tablet, Take 20 mg by mouth at bedtime., Disp: , Rfl:    sacubitril-valsartan (ENTRESTO) 24-26 MG, Take 1 tablet by mouth 2 (two) times daily., Disp: 60 tablet, Rfl: 6   sertraline (ZOLOFT) 50 MG tablet, Take 50 mg by mouth in the morning., Disp: , Rfl:    spironolactone (ALDACTONE) 25 MG tablet, TAKE 1 TABLET BY MOUTH EVERY DAY IN THE MORNING, Disp: 90 tablet, Rfl: 0  IMPRESSION:    ICD-10-CM   1. NICM (nonischemic cardiomyopathy) (HCC)  I42.8     2. Chronic combined systolic and diastolic heart failure (HCC)  U04.54     3. LBBB (left bundle branch block)  I44.7 EKG 12-Lead    4. Biventricular implantable cardioverter-defibrillator in situ  Z95.810     5. Essential hypertension  I10     6. Non-insulin  dependent type 2 diabetes mellitus (HCC)  E11.9     7. Mixed hyperlipidemia  E78.2     8. Encounter for monitoring cardiotoxic drug therapy  Z51.81    Z79.899        RECOMMENDATIONS: Jordan Willis is a 72 y.o. female whose past medical history and cardiovascular risk factors include: Left bundle branch block, type 2 diabetes mellitus, hyperlipidemia, nonischemic cardiomyopathy, chronic combined systolic/diastolic heart failure, recurrent breast cancer status post bilateral mastectomies (currently on chemotherapy), postmenopausal female, advanced age.  NICM (nonischemic cardiomyopathy) (HCC) Chronic combined systolic and diastolic heart failure (HCC) LBBB (left bundle branch block). Status post BiV ICD Stage B, NYHA class II Likely secondary to  chemotherapy/underlying left bundle branch block. Left heart catheterization: Normal epicardial coronaries. Status post BiV ICD managed by Dr. Soyla Murphy on Herceptin after prolonged discussions with advanced heart failure and oncology. She continues to be on cardioprotective medications. Does not complain of heart failure symptoms and plans to have a repeat echocardiogram prior to her appointment with Dr. Gala Romney The dose of Entresto was reduced to 24/26 mg p.o. twice daily it appears that she is tolerating it well.  She does not complain of hypotension/orthostasis.  Essential hypertension Office and home blood pressures are well controlled. Medications reconciled. No changes warranted at this time  Non-insulin dependent type 2 diabetes mellitus (HCC) Most recent hemoglobin A1c reviewed. Reemphasized importance of glycemic control  Mixed hyperlipidemia Currently on atorvastatin.   She denies myalgia or other side effects. Currently managed by primary care provider.  Malignant neoplasm of upper-outer quadrant of left breast in female, estrogen receptor negative (HCC) Currently being followed by oncology.  Both patient and family  are overwhelmed with office visits with multiple medical providers. Currently she is euvolemic and not in congestive heart failure and is status post initiation of Herceptin therapy.  Patient and family understand that she is at high risk of developing acute heart failure or myocarditis and  needs to be monitored closely. However, to help decrease burden I will keep my appointments to 6 months for now as she is already seeing Dr. Gala Romney for advanced heart failure and Dr. Ladona Ridgel from EP. She is more than welcome to see me sooner if she develops any symptoms of heart failure or needs to be reevaluated.  Both family and patient are thankful. Greatly appreciate coordination of care between multiple subspecialties of medicine (EP, advanced heart failure, cardiology, oncology) in taking care of this pleasant patient.  --Continue cardiac medications as reconciled in final medication list. --Return in about 6 months (around 09/15/2023) for Follow up, heart failure management.. Or sooner if needed. --Continue follow-up with your primary care physician regarding the management of your other chronic comorbid conditions.  Patient's questions and concerns were addressed to her satisfaction. She voices understanding of the instructions provided during this encounter.   This note was created using a voice recognition software as a result there may be grammatical errors inadvertently enclosed that do not reflect the nature of this encounter. Every attempt is made to correct such errors.  Tessa Lerner, [place], [facility]  Pager:  [phone] Office: [phone]

## 2023-03-19 ENCOUNTER — Ambulatory Visit: Payer: 59 | Admitting: Hematology and Oncology

## 2023-03-20 NOTE — Progress Notes (Signed)
Remote ICD transmission.   

## 2023-03-21 ENCOUNTER — Inpatient Hospital Stay: Payer: 59

## 2023-03-21 ENCOUNTER — Inpatient Hospital Stay (HOSPITAL_BASED_OUTPATIENT_CLINIC_OR_DEPARTMENT_OTHER): Payer: 59 | Admitting: Adult Health

## 2023-03-21 ENCOUNTER — Telehealth: Payer: Self-pay | Admitting: *Deleted

## 2023-03-21 ENCOUNTER — Encounter: Payer: Self-pay | Admitting: Adult Health

## 2023-03-21 VITALS — BP 135/63 | HR 77 | Temp 97.3°F | Resp 17 | Ht 67.5 in | Wt 185.5 lb

## 2023-03-21 DIAGNOSIS — Z170 Estrogen receptor positive status [ER+]: Secondary | ICD-10-CM | POA: Diagnosis not present

## 2023-03-21 DIAGNOSIS — Z803 Family history of malignant neoplasm of breast: Secondary | ICD-10-CM | POA: Diagnosis not present

## 2023-03-21 DIAGNOSIS — C50412 Malignant neoplasm of upper-outer quadrant of left female breast: Secondary | ICD-10-CM

## 2023-03-21 DIAGNOSIS — Z79811 Long term (current) use of aromatase inhibitors: Secondary | ICD-10-CM | POA: Diagnosis not present

## 2023-03-21 DIAGNOSIS — C50411 Malignant neoplasm of upper-outer quadrant of right female breast: Secondary | ICD-10-CM | POA: Diagnosis not present

## 2023-03-21 DIAGNOSIS — Z171 Estrogen receptor negative status [ER-]: Secondary | ICD-10-CM

## 2023-03-21 DIAGNOSIS — Z923 Personal history of irradiation: Secondary | ICD-10-CM | POA: Diagnosis not present

## 2023-03-21 DIAGNOSIS — Z9013 Acquired absence of bilateral breasts and nipples: Secondary | ICD-10-CM | POA: Diagnosis not present

## 2023-03-21 DIAGNOSIS — Z853 Personal history of malignant neoplasm of breast: Secondary | ICD-10-CM | POA: Diagnosis not present

## 2023-03-21 MED ORDER — DIPHENHYDRAMINE HCL 25 MG PO CAPS
25.0000 mg | ORAL_CAPSULE | Freq: Once | ORAL | Status: AC
  Administered 2023-03-21: 25 mg via ORAL
  Filled 2023-03-21: qty 1

## 2023-03-21 MED ORDER — SODIUM CHLORIDE 0.9 % IV SOLN: Freq: Once | INTRAVENOUS | Status: DC

## 2023-03-21 MED ORDER — ACETAMINOPHEN 325 MG PO TABS
650.0000 mg | ORAL_TABLET | Freq: Once | ORAL | Status: AC
  Administered 2023-03-21: 650 mg via ORAL
  Filled 2023-03-21: qty 2

## 2023-03-21 MED ORDER — MAGIC MOUTHWASH W/LIDOCAINE: ORAL | 2 refills | Status: DC

## 2023-03-21 MED ORDER — TRASTUZUMAB-HYALURONIDASE-OYSK 600-10000 MG-UNT/5ML ~~LOC~~ SOLN
600.0000 mg | Freq: Once | SUBCUTANEOUS | Status: AC
  Administered 2023-03-21: 600 mg via SUBCUTANEOUS
  Filled 2023-03-21: qty 5

## 2023-03-21 NOTE — Progress Notes (Signed)
Per Mardella Layman PA, pt ok to treat with ECHO from 10/29/22 showing EF 30-35%.

## 2023-03-21 NOTE — Progress Notes (Signed)
[facility] Cancer Follow up:    Jordan Brunette, MD [address] [place] [address]   DIAGNOSIS:  Cancer Staging  Malignant neoplasm of upper-outer quadrant of left breast in female, estrogen receptor negative (HCC) Staging form: Breast, AJCC 7th Edition - Clinical: Stage IA (rT1c, N0, M0) - Signed by Serena Croissant, MD on 05/31/2021 Stage prefix: Recurrence Laterality: Right Biopsy of metastatic site performed: No Estrogen receptor status: Positive Progesterone receptor status: Negative HER2 status: Positive - Pathologic: Stage IIA (T2, N0, cM0) - Signed by Serena Croissant, MD on 09/12/2021 Estrogen receptor status: Positive Progesterone receptor status: Positive HER2 status: Negative   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%, PR 0%, HER-2 negative, Ki-67 31%   09/26/2005 - 11/13/2005 Radiation Therapy   Adjuvant radiation therapy   03/18/2008 Initial Biopsy   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   03/22/2008 Surgery   Right breast lumpectomy 0.9 cm DCIS ER 90% PR 98% stage 0   04/11/2008 Procedure   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   05/10/2008 - 07/02/2008 Radiation Therapy   Adjuvant radiation therapy   07/17/2008 - 04/16/2009 Anti-estrogen oral therapy   Femara then switched to tamoxifen which was discontinued in 2010 due  to concern about uterine cancer   05/31/2021 Cancer Staging   Staging form: Breast, AJCC 7th Edition - Clinical: Stage IA (rT1c, N0, M0) - Signed by Serena Croissant, MD on 05/31/2021 Stage prefix: Recurrence Laterality: Right Biopsy of metastatic site performed: No Estrogen receptor status: Positive Progesterone receptor status: Negative HER2 status: Positive   09/12/2021 Cancer Staging   Staging form: Breast, AJCC 7th Edition - Pathologic: Stage IIA (T2, N0, cM0) - Signed by Serena Croissant, MD on 09/12/2021 Estrogen receptor status: Positive Progesterone receptor status: Positive HER2 status: Negative   10/05/2022 Relapse/Recurrence   Palpable lump in the right mastectomy scar: 1.4 x 1.3 x 1.3 cm and a 0.4 cm mass (presence of small residual fluid): Biopsy revealed grade 3 IDC with necrosis ER 90% PR 0% Ki-67 70%, HER2 3+ positive   11/06/2022 Imaging   CT chest/abdomen/pelvis Developing small nodule along the right chest wall lateral to the pectoralis muscle inferior to the battery pack of the patient's defibrillator. A malignant lesion is possible. Recommend further workup such as ultrasound.   No developing other new mass lesion, fluid collection or lymph node enlargement.   Persistent sclerosis diffusely along the sternum.   Fatty liver infiltration.   Left-sided renal stone   Aortic atherosclerosis.  ICD 10 code-I70.0    11/06/2022 Imaging   Intense sternal/manubrial radiotracer uptake corresponding with the expansile lucency and sclerosis seen on CT is compatible with osseous metastatic disease.   No other convincing scintigraphic evidence of osteoblastic metastatic disease   12/04/2022 Surgery   Right breast mastectomy scar with chest wall mass 3 exertion: Grade 3 poorly differentiated ductal  adenocarcinoma with extensive tumor necrosis, margins negative involving subcutis and deep dermis, vascular invasion present.  ER 90%, PR 0%, Ki-67 70%, HER2 3+ positive Left  breast mastectomy scar reexcision: Benign   12/04/2022 Relapse/Recurrence   12/04/2022: Right breast mastectomy scar with chest wall mass 3 exertion: Grade 3 poorly differentiated ductal adenocarcinoma with extensive tumor necrosis, margins negative involving subcutis and deep dermis, vascular invasion present. ER 90%, PR 0%, Ki-67 70%, HER2 3+ positive    02/06/2023 -  Chemotherapy   Patient is on Treatment Plan : BREAST MAINTENANCE Trastuzumab IV (6) or SQ (600) D1 q21d X 11 Cycles     Breast cancer of upper-outer quadrant of right female breast (HCC)  05/23/2021 Relapse/Recurrence   Screening detected right breast mass at 10 o'clock position measuring 1.2 cm, ultrasound-guided biopsy revealed grade 3 IDC ER 20%, PR 0%, HER2 positive 3+, Ki-67 50%   09/04/2021 Surgery   Bilateral mastectomies Left mastectomy: Benign Right mastectomy: Grade 3 IDC with DCIS 3.6 cm, 0/6 lymph nodes negative, ER 20%, PR 0%, HER2 positive by IHC, Ki-67 50%   11/21/2021 - 01/23/2022 Chemotherapy   Patient is on Treatment Plan : BREAST Trastuzumab q21d     04/2022 -  Anti-estrogen oral therapy   Anastrozole daily; changed to Exemestane     CURRENT THERAPY: Exemestane; Herceptin  INTERVAL HISTORY: Jordan Willis 72 y.o. female returns for follow-up prior to receiving treatment with Herceptin.  This is her third dose of this medication.  She has a decreased ejection fraction of 30 to 35% however has been approved to proceed with treatment per Dr. Pamelia Hoit and Dr. Gala Romney with close follow-up with Dr. Gala Romney and repeat echocardiogram that is scheduled on April 08, 2023.  Fatigue that she feels is worse than it was previously however she cannot pinpoint any timeframe that this fatigue began or when the fatigue worsened.  Her weight today is stable.  Her blood pressure is within normal range.  And her heart rate is normal as well.  She denies any increased shortness of breath on exertion.  She is taking exemestane daily  and continues to tolerate it well.   Patient Active Problem List   Diagnosis Date Noted   ICD (implantable cardioverter-defibrillator) in place 12/25/2022   Bradycardia 08/24/2022   BRCA2 gene mutation positive in female 07/30/2022   LBBB (left bundle branch block)    Chronic diastolic heart failure (HCC)    NICM (nonischemic cardiomyopathy) (HCC)    Breast cancer of upper-outer quadrant of right female breast (HCC) 09/04/2021   Diabetic neuropathy (HCC) 02/08/2021   Angina pectoris (HCC) 02/09/2020   Abnormal stress test 02/09/2020   Complete rotator cuff tear 09/03/2018   Pelvic prolapse 05/16/2017   Intractable chronic post-traumatic headache 11/19/2016   Malignant neoplasm of upper-outer quadrant of left breast in female, estrogen receptor negative (HCC) 03/03/2012   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  MEDICAL HISTORY: Past Medical History:  Diagnosis Date   AICD (automatic cardioverter/defibrillator) present    Anemia    Anginal pain (HCC)    Anxiety    Arthritis    Lumbar spine DDD.   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  Diabetes mellitus without complication (HCC)    Type II   Dyspnea    When patient gets sick uses Pro-Air inhaler   Frequent sinus infections    GERD (gastroesophageal reflux disease)    occ   Goiter    History of cardiomegaly    History of cataract    Bilateral   History of kidney stones    08/28/2018 currently has a large kidney stone, asymptomatic at this time   History of migraine    History of uterine fibroid    History of uterine prolapse    Hyperlipidemia    Hypertension    LBBB (left bundle branch block)     Menopause 03/03/2012   Nasal turbinate hypertrophy 11/29/2016   Left inferior    Personal history of chemotherapy    Personal history of radiation therapy    Pneumonia    as a child   PONV (postoperative nausea and vomiting)    Sinusitis 2014   Sleep apnea    No CPAP   SUI (stress urinary incontinence, female)    SVD (spontaneous vaginal delivery)    x 2   Thyroid nodule     SURGICAL HISTORY: Past Surgical History:  Procedure Laterality Date   ABDOMINAL HYSTERECTOMY     BILATERAL SALPINGOOPHORECTOMY Bilateral    BIV ICD INSERTION CRT-D N/A 08/24/2022   Procedure: BIV ICD INSERTION CRT-D;  Surgeon: Marinus Maw, MD;  Location: [facility] INVASIVE CV LAB;  Service: Cardiovascular;  Laterality: N/A;   BLADDER SUSPENSION N/A 05/16/2017   Procedure: TRANSVAGINAL TAPE (TVT) PROCEDURE;  Surgeon: Osborn Coho, MD;  Location: [facility] ORS;  Service: Gynecology;  Laterality: N/A;   BREAST BIOPSY Right 2009   BREAST IMPLANT REMOVAL Bilateral 02/05/2022   Procedure: Removal bilateral breast implants;  Surgeon: Allena Napoleon, MD;  Location: [facility] OR;  Service: Plastics;  Laterality: Bilateral;   BREAST LUMPECTOMY Right 2009   BREAST LUMPECTOMY Left 2006   BREAST RECONSTRUCTION WITH PLACEMENT OF TISSUE EXPANDER AND FLEX HD (ACELLULAR HYDRATED DERMIS) Bilateral 09/04/2021   Procedure: BILATERAL BREAST RECONSTRUCTION WITH PLACEMENT OF TISSUE EXPANDER AND FLEX HD (ACELLULAR HYDRATED DERMIS);  Surgeon: Allena Napoleon, MD;  Location: [facility] OR;  Service: Plastics;  Laterality: Bilateral;   CARDIAC CATHETERIZATION     CATARACT EXTRACTION Left 10/23/2017   CATARACT EXTRACTION Right 11/06/2017   COLONOSCOPY  10/22/2009   Normal.  Repeat 5 years.  [name]   COLONOSCOPY WITH PROPOFOL N/A 12/21/2022   Procedure: COLONOSCOPY WITH PROPOFOL;  Surgeon: Jeani Hawking, MD;  Location: [facility] ENDOSCOPY;  Service: Gastroenterology;  Laterality: N/A;   CYSTOSCOPY N/A 05/16/2017   Procedure: CYSTOSCOPY;  Surgeon: Osborn Coho,  MD;  Location: [facility] ORS;  Service: Gynecology;  Laterality: N/A;   DEBRIDEMENT AND CLOSURE WOUND Bilateral 10/03/2021   Procedure: Debridement bilateral mastectomy flaps;  Surgeon: Allena Napoleon, MD;  Location: [facility] OR;  Service: Plastics;  Laterality: Bilateral;  1.5 hour   EYE SURGERY     bilateral cataracts   LEFT HEART CATH AND CORONARY ANGIOGRAPHY N/A 02/09/2020   Procedure: LEFT HEART CATH AND CORONARY ANGIOGRAPHY;  Surgeon: Elder Negus, MD;  Location: [facility] INVASIVE CV LAB;  Service: Cardiovascular;  Laterality: N/A;   LEFT HEART CATH AND CORONARY ANGIOGRAPHY N/A 03/27/2022   Procedure: LEFT HEART CATH AND CORONARY ANGIOGRAPHY;  Surgeon: Yates Decamp, MD;  Location: [facility] INVASIVE CV LAB;  Service: Cardiovascular;  Laterality: N/A;   MASS EXCISION Right 12/04/2022   Procedure: EXCISION OF RIGHT CHEST WALL MASS;  Surgeon: Almond Lint, MD;  Location: [facility] OR;  Service: General;  Laterality: Right;   MASTECTOMY W/ SENTINEL NODE BIOPSY Right 09/04/2021   Procedure: BILATERAL MASTECTOMIES WITH RIGHT SENTINEL LYMPH NODE BIOPSY;  Surgeon: Almond Lint, MD;  Location: [facility] OR;  Service: General;  Laterality: Right;   REMOVAL OF BILATERAL TISSUE EXPANDERS WITH PLACEMENT OF BILATERAL BREAST IMPLANTS Bilateral 10/03/2021   Procedure: REMOVAL OF BILATERAL TISSUE EXPANDERS WITH REPLACEMENT OF TISSUE EXPANDERS;  Surgeon: Allena Napoleon, MD;  Location: [facility] OR;  Service: Plastics;  Laterality: Bilateral;   REMOVAL OF BILATERAL TISSUE EXPANDERS WITH PLACEMENT OF BILATERAL BREAST IMPLANTS Bilateral 12/19/2021   Procedure: REMOVAL OF BILATERAL TISSUE EXPANDERS WITH PLACEMENT OF BILATERAL BREAST IMPLANTS;  Surgeon: Allena Napoleon, MD;  Location: [facility] OR;  Service: Plastics;  Laterality: Bilateral;   ROBOTIC ASSISTED TOTAL HYSTERECTOMY N/A 05/16/2017   Procedure: ROBOTIC ASSISTED TOTAL HYSTERECTOMY;  Surgeon: Silverio Lay, MD;  Location: [facility] ORS;  Service: Gynecology;  Laterality: N/A;   ROTATOR CUFF REPAIR Bilateral     SCAR REVISION N/A 12/04/2022   Procedure: REVISION OF MASTECTOMY SCAR;  Surgeon: Almond Lint, MD;  Location: [facility] OR;  Service: General;  Laterality: N/A;   SHOULDER ARTHROSCOPY WITH ROTATOR CUFF REPAIR AND SUBACROMIAL DECOMPRESSION Right 09/03/2018   Procedure: Right shoulder manipulation under anesthesia, exam under anesthesia, mini open rotator cuff repair, subacromial decompression;  Surgeon: Jene Every, MD;  Location: [facility] ORS;  Service: Orthopedics;  Laterality: Right;  90 mins   TUBAL LIGATION     WISDOM TOOTH EXTRACTION      SOCIAL HISTORY: Social History   Socioeconomic History   Marital status: Divorced    Spouse name: n/a   Number of children: 2   Years of education: 12   Highest education level: Not on file  Occupational History   Occupation: Retired    Associate Professor: [employer]    Comment: Diplomatic Services operational officer  Tobacco Use   Smoking status: Never   Smokeless tobacco: Never  Vaping Use   Vaping Use: Never used  Substance and Sexual Activity   Alcohol use: No   Drug use: No   Sexual activity: Not Currently    Partners: Male    Birth control/protection: Surgical, Post-menopausal    Comment: Hysterectomy  Other Topics Concern   Not on file  Social History Narrative   Marital status: divorced; dating seriously x many years.       Children:  2 children; 1 grandchild.      Employment: Research officer, political party.      Lives: alone      Tobacco: none       Alcohol: none      Exercise:  Walking with sister two days per week.      Seatbelt: 100%      Guns: none      Right-handed   Caffeine: occasional coffee, hot tea or green tea a few times per week   Social Determinants of Health   Financial Resource Strain: Not on file  Food Insecurity: Not on file  Transportation Needs: Not on file  Physical Activity: Not on file  Stress: Not on file  Social Connections: Not on file  Intimate Partner Violence: Not on file    FAMILY HISTORY: Family History  Problem Relation  Age of Onset   Cancer Mother 47       breast cancer   Hypertension Mother    Diabetes Father    Cancer Sister  breast cancer   Breast cancer Sister    Cancer Sister        breast cancer   Breast cancer Sister     Review of Systems  Constitutional:  Positive for fatigue. Negative for appetite change, chills, fever and unexpected weight change.  HENT:   Negative for hearing loss, lump/mass and trouble swallowing.   Eyes:  Negative for eye problems and icterus.  Respiratory:  Negative for chest tightness, cough and shortness of breath.   Cardiovascular:  Negative for chest pain, leg swelling and palpitations.  Gastrointestinal:  Negative for abdominal distention, abdominal pain, constipation, diarrhea, nausea and vomiting.  Endocrine: Negative for hot flashes.  Genitourinary:  Negative for difficulty urinating.   Musculoskeletal:  Negative for arthralgias.  Skin:  Negative for itching and rash.  Neurological:  Negative for dizziness, extremity weakness, headaches and numbness.  Hematological:  Negative for adenopathy. Does not bruise/bleed easily.  Psychiatric/Behavioral:  Negative for depression. The patient is not nervous/anxious.       PHYSICAL EXAMINATION    Vitals:   03/21/23 0932  BP: 135/63  Pulse: 77  Resp: 17  Temp: (!) 97.3 F (36.3 C)  SpO2: 100%    Physical Exam Constitutional:      General: She is not in acute distress.    Appearance: Normal appearance. She is not toxic-appearing.  HENT:     Head: Normocephalic and atraumatic.     Mouth/Throat:     Mouth: Mucous membranes are moist.     Pharynx: Oropharynx is clear. No oropharyngeal exudate or posterior oropharyngeal erythema.  Eyes:     General: No scleral icterus. Cardiovascular:     Rate and Rhythm: Normal rate and regular rhythm.     Pulses: Normal pulses.     Heart sounds: Normal heart sounds.  Pulmonary:     Effort: Pulmonary effort is normal.     Breath sounds: Normal breath sounds.   Abdominal:     General: Abdomen is flat. Bowel sounds are normal. There is no distension.     Palpations: Abdomen is soft.     Tenderness: There is no abdominal tenderness.  Musculoskeletal:        General: No swelling.     Cervical back: Neck supple.  Lymphadenopathy:     Cervical: No cervical adenopathy.  Skin:    General: Skin is warm and dry.     Findings: No rash.  Neurological:     General: No focal deficit present.     Mental Status: She is alert.  Psychiatric:        Mood and Affect: Mood normal.        Behavior: Behavior normal.     LABORATORY DATA:  CBC    Component Value Date/Time   WBC 4.7 11/26/2022 0821   RBC 4.38 11/26/2022 0821   HGB 12.9 11/26/2022 0821   HGB 12.6 10/25/2022 1310   HGB 14.9 10/06/2013 0859   HCT 39.4 11/26/2022 0821   HCT 38.0 10/25/2022 1310   HCT 43.2 10/06/2013 0859   PLT 254 11/26/2022 0821   PLT 305 10/25/2022 1310   MCV 90.0 11/26/2022 0821   MCV 89 10/25/2022 1310   MCV 88.9 10/06/2013 0859   MCH 29.5 11/26/2022 0821   MCHC 32.7 11/26/2022 0821   RDW 12.4 11/26/2022 0821   RDW 13.2 10/25/2022 1310   RDW 13.0 10/06/2013 0859   LYMPHSABS 1.7 09/19/2022 0833   LYMPHSABS 2.6 10/06/2013 0859   MONOABS 0.3 03/28/2022 0758  MONOABS 0.4 10/06/2013 0859   EOSABS 0.2 09/19/2022 0833   BASOSABS 0.0 09/19/2022 0833   BASOSABS 0.1 10/06/2013 0859    CMP     Component Value Date/Time   NA 137 11/26/2022 0821   NA 135 09/19/2022 0833   NA 139 10/06/2013 0900   K 3.7 11/26/2022 0821   K 3.6 10/06/2013 0900   CL 102 11/26/2022 0821   CL 104 02/20/2013 1109   CO2 23 11/26/2022 0821   CO2 24 10/06/2013 0900   GLUCOSE 107 (H) 11/26/2022 0821   GLUCOSE 106 10/06/2013 0900   GLUCOSE 98 02/20/2013 1109   BUN 8 11/26/2022 0821   BUN 13 09/19/2022 0833   BUN 10.4 10/06/2013 0900   CREATININE 0.72 11/26/2022 0821   CREATININE 0.62 03/28/2022 0758   CREATININE 0.7 10/06/2013 0900   CALCIUM 9.7 11/26/2022 0821   CALCIUM 10.3  10/06/2013 0900   PROT 7.9 03/28/2022 0758   PROT 8.6 (H) 10/06/2013 0900   ALBUMIN 4.5 03/28/2022 0758   ALBUMIN 4.4 10/06/2013 0900   AST 15 03/28/2022 0758   AST 22 10/06/2013 0900   ALT 11 03/28/2022 0758   ALT 25 10/06/2013 0900   ALKPHOS 89 03/28/2022 0758   ALKPHOS 133 10/06/2013 0900   BILITOT 0.4 03/28/2022 0758   BILITOT 0.24 10/06/2013 0900   GFRNONAA >60 11/26/2022 0821   GFRNONAA >60 03/28/2022 0758   GFRAA 111 10/13/2020 1001        ASSESSMENT and THERAPY PLAN:   Malignant neoplasm of upper-outer quadrant of left breast in female, estrogen receptor negative (HCC) Robertine is here for follow-up and evaluation of her metastatic breast cancer.  She is currently on treatment with Herceptin and exemestane.  Stage IV breast cancer: We discovered this through [facility] testing that revealed a bone scan that showed sternal mets.  She will continue on her treatment with exemestane and Herceptin.  I reached out to Dr. Pamelia Hoit to understand if he is following [facility] alone or if he wants a bone scan prior to her next visit with [place] in July.  Heart failure: She is receiving Herceptin every 3 weeks with close follow-up with Dr. Gala Romney.  Today she will receive her third dose and she will follow-up with Dr. Gala Romney on June 17.  She has been cleared to proceed with treatment per Dr. Pamelia Hoit and Dr. Gala Romney.  She has no new symptoms of worsening heart failure.  She will proceed with treatment today.  Requested to see a nutritionist during her next visit therefore I placed a referral so one of our registered dietitians can meet with her during her next treatment.  RTC in 3 weeks for Herceptin and in 6 weeks for f/u with Dr. Pamelia Hoit and Herceptin.      All questions were answered. The patient knows to call the clinic with any problems, questions or concerns. We can certainly see the patient much sooner if necessary.  Total encounter time:30 minutes*in face-to-face visit time,  chart review, lab review, care coordination, order entry, and documentation of the encounter time.    Lillard Anes, NP 03/21/23 10:25 AM Medical Oncology and Hematology [facility] [address] [place], [place] [address] Tel. [phone]    Fax. [phone]  *Total Encounter Time as defined by the Centers for Medicare and Medicaid Services includes, in addition to the face-to-face time of a patient visit (documented in the note above) non-face-to-face time: obtaining and reviewing outside history, ordering and reviewing medications, tests or procedures,  care coordination (communications with other health care professionals or caregivers) and documentation in the medical record.

## 2023-03-21 NOTE — Assessment & Plan Note (Addendum)
Jordan Willis is here for follow-up and evaluation of her metastatic breast cancer.  She is currently on treatment with Herceptin and exemestane.  Stage IV breast cancer: We discovered this through [facility] testing that revealed a bone scan that showed sternal mets.  She will continue on her treatment with exemestane and Herceptin.  I reached out to Dr. Pamelia Hoit to understand if he is following [facility] alone or if he wants a bone scan prior to her next visit with [place] in July.  Heart failure: She is receiving Herceptin every 3 weeks with close follow-up with Dr. Gala Romney.  Today she will receive her third dose and she will follow-up with Dr. Gala Romney on June 17.  She has been cleared to proceed with treatment per Dr. Pamelia Hoit and Dr. Gala Romney.  She has no new symptoms of worsening heart failure.  She will proceed with treatment today.  Requested to see a nutritionist during her next visit therefore I placed a referral so one of our registered dietitians can meet with her during her next treatment.  RTC in 3 weeks for Herceptin and in 6 weeks for f/u with Dr. Pamelia Hoit and Herceptin.

## 2023-03-21 NOTE — Telephone Encounter (Signed)
MMW called into pt's pharmacy due to inability to send electronically.

## 2023-03-21 NOTE — Patient Instructions (Signed)
[facility]  Discharge Instructions: Thank you for choosing [facility] to provide your oncology and hematology care.   If you have a lab appointment with the Cancer Center, please go directly to the Cancer Center and check in at the registration area.   Wear comfortable clothing and clothing appropriate for easy access to any Portacath or PICC line.   We strive to give you quality time with your provider. You may need to reschedule your appointment if you arrive late (15 or more minutes).  Arriving late affects you and other patients whose appointments are after yours.  Also, if you miss three or more appointments without notifying the office, you may be dismissed from the clinic at the provider's discretion.      For prescription refill requests, have your pharmacy contact our office and allow 72 hours for refills to be completed.    Today you received the following chemotherapy and/or immunotherapy agents: Herceptin Hylecta      To help prevent nausea and vomiting after your treatment, we encourage you to take your nausea medication as directed.  BELOW ARE SYMPTOMS THAT SHOULD BE REPORTED IMMEDIATELY: *FEVER GREATER THAN 100.4 F (38 C) OR HIGHER *CHILLS OR SWEATING *NAUSEA AND VOMITING THAT IS NOT CONTROLLED WITH YOUR NAUSEA MEDICATION *UNUSUAL SHORTNESS OF BREATH *UNUSUAL BRUISING OR BLEEDING *URINARY PROBLEMS (pain or burning when urinating, or frequent urination) *BOWEL PROBLEMS (unusual diarrhea, constipation, pain near the anus) TENDERNESS IN MOUTH AND THROAT WITH OR WITHOUT PRESENCE OF ULCERS (sore throat, sores in mouth, or a toothache) UNUSUAL RASH, SWELLING OR PAIN  UNUSUAL VAGINAL DISCHARGE OR ITCHING   Items with * indicate a potential emergency and should be followed up as soon as possible or go to the Emergency Department if any problems should occur.  Please show the CHEMOTHERAPY ALERT CARD or IMMUNOTHERAPY ALERT CARD  at check-in to the Emergency Department and triage nurse.  Should you have questions after your visit or need to cancel or reschedule your appointment, please contact [facility]  Dept: [phone]  and follow the prompts.  Office hours are 8:00 a.m. to 4:30 p.m. Monday - Friday. Please note that voicemails left after 4:00 p.m. may not be returned until the following business day.  We are closed weekends and major holidays. You have access to a nurse at all times for urgent questions. Please call the main number to the clinic Dept: [phone] and follow the prompts.   For any non-urgent questions, you may also contact your provider using MyChart. We now offer e-Visits for anyone 18 and older to request care online for non-urgent symptoms. For details visit [email or web address].   Also download the MyChart app! Go to the app store, search "MyChart", open the app, select [place], and log in with your MyChart username and password.   

## 2023-03-22 ENCOUNTER — Other Ambulatory Visit: Payer: Self-pay | Admitting: Adult Health

## 2023-03-22 DIAGNOSIS — Z171 Estrogen receptor negative status [ER-]: Secondary | ICD-10-CM

## 2023-03-22 DIAGNOSIS — C419 Malignant neoplasm of bone and articular cartilage, unspecified: Secondary | ICD-10-CM

## 2023-03-25 DIAGNOSIS — M8580 Other specified disorders of bone density and structure, unspecified site: Secondary | ICD-10-CM | POA: Diagnosis not present

## 2023-04-01 ENCOUNTER — Ambulatory Visit (INDEPENDENT_AMBULATORY_CARE_PROVIDER_SITE_OTHER): Payer: 59 | Admitting: Plastic Surgery

## 2023-04-01 ENCOUNTER — Encounter: Payer: Self-pay | Admitting: Plastic Surgery

## 2023-04-01 VITALS — BP 129/69 | HR 96

## 2023-04-01 DIAGNOSIS — L905 Scar conditions and fibrosis of skin: Secondary | ICD-10-CM

## 2023-04-01 DIAGNOSIS — Z9889 Other specified postprocedural states: Secondary | ICD-10-CM

## 2023-04-01 NOTE — Progress Notes (Signed)
Ms. Jordan Willis returns today for evaluation of her left mastectomy site.  She underwent a scar revision in February 2024 due to a nonhealing wound.  Since that time she has done well and she returns today with her wound completely healed and she is happy with the both the appearance and the closure.  On examination the incision is well-healed and there is no evidence of drainage or infection.  We discussed activity and care of the scar.  She will continue scar massage as she thinks about it.  He may return to all activities as tolerated.  I will place a consult for physical therapy to help with range of motion.  She may follow-up with me as needed.

## 2023-04-02 ENCOUNTER — Ambulatory Visit: Payer: 59 | Attending: Internal Medicine | Admitting: Internal Medicine

## 2023-04-02 ENCOUNTER — Encounter: Payer: Self-pay | Admitting: Internal Medicine

## 2023-04-02 ENCOUNTER — Encounter: Payer: Self-pay | Admitting: Hematology and Oncology

## 2023-04-02 VITALS — BP 110/58 | HR 77 | Ht 67.5 in | Wt 182.0 lb

## 2023-04-02 DIAGNOSIS — I428 Other cardiomyopathies: Secondary | ICD-10-CM | POA: Diagnosis not present

## 2023-04-02 DIAGNOSIS — Z95810 Presence of automatic (implantable) cardiac defibrillator: Secondary | ICD-10-CM | POA: Diagnosis not present

## 2023-04-02 DIAGNOSIS — I447 Left bundle-branch block, unspecified: Secondary | ICD-10-CM

## 2023-04-02 LAB — CUP PACEART INCLINIC DEVICE CHECK
Battery Remaining Longevity: 97 mo
Brady Statistic RA Percent Paced: 0 %
Brady Statistic RV Percent Paced: 1.1 %
Date Time Interrogation Session: 20240611101749
HighPow Impedance: 56.25 Ohm
Implantable Lead Connection Status: 753985
Implantable Lead Connection Status: 753985
Implantable Lead Connection Status: 753985
Implantable Lead Implant Date: 20231103
Implantable Lead Implant Date: 20231103
Implantable Lead Implant Date: 20231103
Implantable Lead Location: 753858
Implantable Lead Location: 753859
Implantable Lead Location: 753860
Implantable Lead Model: 3830
Implantable Pulse Generator Implant Date: 20231103
Lead Channel Impedance Value: 300 Ohm
Lead Channel Impedance Value: 475 Ohm
Lead Channel Impedance Value: 487.5 Ohm
Lead Channel Pacing Threshold Amplitude: 0.75 V
Lead Channel Pacing Threshold Amplitude: 0.75 V
Lead Channel Pacing Threshold Amplitude: 0.75 V
Lead Channel Pacing Threshold Amplitude: 0.75 V
Lead Channel Pacing Threshold Pulse Width: 0.5 ms
Lead Channel Pacing Threshold Pulse Width: 0.5 ms
Lead Channel Pacing Threshold Pulse Width: 0.5 ms
Lead Channel Pacing Threshold Pulse Width: 0.5 ms
Lead Channel Sensing Intrinsic Amplitude: 11.7 mV
Lead Channel Sensing Intrinsic Amplitude: 3.4 mV
Lead Channel Setting Pacing Amplitude: 2.5 V
Lead Channel Setting Pacing Amplitude: 2.5 V
Lead Channel Setting Pacing Pulse Width: 0.5 ms
Lead Channel Setting Pacing Pulse Width: 0.5 ms
Lead Channel Setting Sensing Sensitivity: 0.5 mV
Pulse Gen Serial Number: 5548670
Zone Setting Status: 755011

## 2023-04-02 NOTE — Progress Notes (Signed)
HPI Ms. Caballes returns today for followup. She is a pleasant 72 yo woman with bilateral breast CA, non-ischemic CM, chronic systolic heart failure, class 2, and LBBB. She underwent biv ICD insertion. Her CS was quite toruous and her veins diminutive and we opted to placed a left bundle area lead. Her initial ECG post op was narrow but subsequent showed septal capture and pacing induced LBBB. We turned off her pacing. In the interim, she has begun to exercise by going to a pool and doing water aerobics. She can walk but not too fast.  Allergies  Allergen Reactions   Bactrim [Sulfamethoxazole-Trimethoprim] Shortness Of Breath    Shortness of breath, difficulty breathing, headache, rash, fatigue   Aspirin Palpitations and Other (See Comments)    Heart fluttering, pt takes 40.5 mg (half of 81 mg) daily Other reaction(s): bradycardia, Not available   Sulfa Antibiotics Hives   Tape Rash    Tears skin off     Current Outpatient Medications  Medication Sig Dispense Refill   albuterol (VENTOLIN HFA) 108 (90 Base) MCG/ACT inhaler Inhale 2 puffs into the lungs every 6 (six) hours as needed for wheezing or shortness of breath.     b complex vitamins capsule Take 1 capsule by mouth daily.     baclofen (LIORESAL) 10 MG tablet Take 10 mg by mouth 3 (three) times daily.     bumetanide (BUMEX) 1 MG tablet TAKE 1 TABLET BY MOUTH EVERY DAY 90 tablet 1   cetirizine (ZYRTEC) 10 MG tablet Take 10 mg by mouth in the morning.     Cholecalciferol (VITAMIN D3) 25 MCG (1000 UT) CAPS Take 1,000 Units by mouth in the morning.     diclofenac Sodium (VOLTAREN) 1 % GEL Apply 1 Application topically 4 (four) times daily as needed (pain).     exemestane (AROMASIN) 25 MG tablet Take 1 tablet (25 mg total) by mouth daily after breakfast. 90 tablet 3   FARXIGA 10 MG TABS tablet TAKE 1 TABLET BY MOUTH EVERY DAY BEFORE BREAKFAST 90 tablet 0   fluticasone (FLONASE) 50 MCG/ACT nasal spray Place 1 spray into both nostrils  in the morning.     GLUCOSAMINE-CHONDROITIN PO Take 1 tablet by mouth every evening.      glucose blood (ONETOUCH VERIO) test strip TEST ONCE DAILY AS DIRECTED 90     ibuprofen (ADVIL) 200 MG tablet Take 400 mg by mouth every 6 (six) hours as needed for mild pain or moderate pain.     magic mouthwash w/lidocaine SOLN 1 part viscous lidocaine 1 part Maalox 1 part diphenhydramine 12.5 mg per 5 ml  Pt to use 5-10 ml qid prn swish gargle and spit 240 mL 2   metFORMIN (GLUCOPHAGE-XR) 500 MG 24 hr tablet Take 500 mg by mouth 2 (two) times daily.     metoprolol succinate (TOPROL-XL) 25 MG 24 hr tablet TAKE 1 TABLET (25 MG TOTAL) BY MOUTH IN THE MORNING. HOLD IF SYSTOLIC BLOOD PRESSURE (TOP BLOOD PRESSURE NUMBER) LESS THAN 100 MMHG OR HEART RATE LESS THAN 60 BPM (PULSE). 90 tablet 3   Multiple Vitamin (MULTIVITAMIN WITH MINERALS) TABS tablet Take 1 tablet by mouth daily. Centrum Silver for Women 50+     nitroGLYCERIN (NITROSTAT) 0.4 MG SL tablet Place 0.4 mg under the tongue every 5 (five) minutes as needed for chest pain.     Olopatadine HCl 0.2 % SOLN Place 1 drop into both eyes daily. Pataday     ondansetron [facility])  4 MG tablet Take 1 tablet (4 mg total) by mouth every 6 (six) hours as needed for nausea or vomiting. 20 tablet 0   OneTouch Delica Lancets 33G MISC Apply topically.     oxyCODONE (OXY IR/ROXICODONE) 5 MG immediate release tablet Take 1 tablet (5 mg total) by mouth every 6 (six) hours as needed for severe pain or moderate pain. 30 tablet 0   Probiotic Product (PROBIOTIC DAILY PO) Take 1 capsule by mouth in the morning.     rosuvastatin (CRESTOR) 20 MG tablet Take 20 mg by mouth at bedtime.     sacubitril-valsartan (ENTRESTO) 24-26 MG Take 1 tablet by mouth 2 (two) times daily. 60 tablet 6   sertraline (ZOLOFT) 50 MG tablet Take 50 mg by mouth in the morning.     spironolactone (ALDACTONE) 25 MG tablet TAKE 1 TABLET BY MOUTH EVERY DAY IN THE MORNING 90 tablet 0   No current  facility-administered medications for this visit.     Past Medical History:  Diagnosis Date   AICD (automatic cardioverter/defibrillator) present    Anemia    Anginal pain (HCC)    Anxiety    Arthritis    Lumbar spine DDD.   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   Diabetes mellitus without complication (HCC)    Type II   Dyspnea    When patient gets sick uses Pro-Air inhaler   Frequent sinus infections    GERD (gastroesophageal reflux disease)    occ   Goiter    History of cardiomegaly    History of cataract    Bilateral   History of kidney stones    08/28/2018 currently has a large kidney stone, asymptomatic at this time   History of migraine    History of uterine fibroid    History of uterine prolapse    Hyperlipidemia    Hypertension    LBBB (left bundle branch block)    Menopause 03/03/2012   Nasal turbinate hypertrophy 11/29/2016   Left inferior    Personal history of chemotherapy    Personal history of radiation therapy    Pneumonia    as a child   PONV (postoperative nausea and vomiting)    Sinusitis 2014   Sleep apnea    No CPAP   SUI (stress urinary incontinence, female)    SVD (spontaneous vaginal delivery)    x 2   Thyroid nodule     ROS:   All systems reviewed and negative except as noted in the HPI.   Past Surgical History:  Procedure Laterality Date   ABDOMINAL HYSTERECTOMY     BILATERAL SALPINGOOPHORECTOMY Bilateral    BIV ICD INSERTION CRT-D N/A 08/24/2022   Procedure: BIV ICD INSERTION CRT-D;  Surgeon: Marinus Maw, MD;  Location: [facility] INVASIVE CV LAB;  Service: Cardiovascular;  Laterality: N/A;   BLADDER SUSPENSION N/A 05/16/2017   Procedure: TRANSVAGINAL TAPE (TVT) PROCEDURE;  Surgeon: Osborn Coho, MD;  Location: [facility] ORS;  Service: Gynecology;   Laterality: N/A;   BREAST BIOPSY Right 2009   BREAST IMPLANT REMOVAL Bilateral 02/05/2022   Procedure: Removal bilateral breast implants;  Surgeon: Allena Napoleon, MD;  Location: [facility] OR;  Service: Plastics;  Laterality: Bilateral;   BREAST LUMPECTOMY Right 2009   BREAST LUMPECTOMY Left 2006   BREAST  RECONSTRUCTION WITH PLACEMENT OF TISSUE EXPANDER AND FLEX HD (ACELLULAR HYDRATED DERMIS) Bilateral 09/04/2021   Procedure: BILATERAL BREAST RECONSTRUCTION WITH PLACEMENT OF TISSUE EXPANDER AND FLEX HD (ACELLULAR HYDRATED DERMIS);  Surgeon: Allena Napoleon, MD;  Location[facility] OR;  Service: Plastics;  Laterality: Bilateral;   CARDIAC CATHETERIZATION     CATARACT EXTRACTION Left 10/23/2017   CATARACT EXTRACTION Right 11/06/2017   COLONOSCOPY  10/22/2009   Normal.  Repeat 5 years.  [name]   COLONOSCOPY WITH PROPOFOL N/A 12/21/2022   Procedure: COLONOSCOPY WITH PROPOFOL;  Surgeon: Jeani Hawking, MD;  Location: [facility] ENDOSCOPY;  Service: Gastroenterology;  Laterality: N/A;   CYSTOSCOPY N/A 05/16/2017   Procedure: CYSTOSCOPY;  Surgeon: Osborn Coho, MD;  Location: [facility] ORS;  Service: Gynecology;  Laterality: N/A;   DEBRIDEMENT AND CLOSURE WOUND Bilateral 10/03/2021   Procedure: Debridement bilateral mastectomy flaps;  Surgeon: Allena Napoleon, MD;  Location: [facility] OR;  Service: Plastics;  Laterality: Bilateral;  1.5 hour   EYE SURGERY     bilateral cataracts   LEFT HEART CATH AND CORONARY ANGIOGRAPHY N/A 02/09/2020   Procedure: LEFT HEART CATH AND CORONARY ANGIOGRAPHY;  Surgeon: Elder Negus, MD;  Location: [facility] INVASIVE CV LAB;  Service: Cardiovascular;  Laterality: N/A;   LEFT HEART CATH AND CORONARY ANGIOGRAPHY N/A 03/27/2022   Procedure: LEFT HEART CATH AND CORONARY ANGIOGRAPHY;  Surgeon: Yates Decamp, MD;  Location: [facility] INVASIVE CV LAB;  Service: Cardiovascular;  Laterality: N/A;   MASS EXCISION Right 12/04/2022   Procedure: EXCISION OF RIGHT CHEST WALL MASS;  Surgeon: Almond Lint, MD;  Location: [facility]  OR;  Service: General;  Laterality: Right;   MASTECTOMY W/ SENTINEL NODE BIOPSY Right 09/04/2021   Procedure: BILATERAL MASTECTOMIES WITH RIGHT SENTINEL LYMPH NODE BIOPSY;  Surgeon: Almond Lint, MD;  Location: [facility] OR;  Service: General;  Laterality: Right;   REMOVAL OF BILATERAL TISSUE EXPANDERS WITH PLACEMENT OF BILATERAL BREAST IMPLANTS Bilateral 10/03/2021   Procedure: REMOVAL OF BILATERAL TISSUE EXPANDERS WITH REPLACEMENT OF TISSUE EXPANDERS;  Surgeon: Allena Napoleon, MD;  Location: [facility] OR;  Service: Plastics;  Laterality: Bilateral;   REMOVAL OF BILATERAL TISSUE EXPANDERS WITH PLACEMENT OF BILATERAL BREAST IMPLANTS Bilateral 12/19/2021   Procedure: REMOVAL OF BILATERAL TISSUE EXPANDERS WITH PLACEMENT OF BILATERAL BREAST IMPLANTS;  Surgeon: Allena Napoleon, MD;  Location: [facility] OR;  Service: Plastics;  Laterality: Bilateral;   ROBOTIC ASSISTED TOTAL HYSTERECTOMY N/A 05/16/2017   Procedure: ROBOTIC ASSISTED TOTAL HYSTERECTOMY;  Surgeon: Silverio Lay, MD;  Location: [facility] ORS;  Service: Gynecology;  Laterality: N/A;   ROTATOR CUFF REPAIR Bilateral    SCAR REVISION N/A 12/04/2022   Procedure: REVISION OF MASTECTOMY SCAR;  Surgeon: Almond Lint, MD;  Location: [facility] OR;  Service: General;  Laterality: N/A;   SHOULDER ARTHROSCOPY WITH ROTATOR CUFF REPAIR AND SUBACROMIAL DECOMPRESSION Right 09/03/2018   Procedure: Right shoulder manipulation under anesthesia, exam under anesthesia, mini open rotator cuff repair, subacromial decompression;  Surgeon: Jene Every, MD;  Location: [facility] ORS;  Service: Orthopedics;  Laterality: Right;  90 mins   TUBAL LIGATION     WISDOM TOOTH EXTRACTION       Family History  Problem Relation Age of Onset   Cancer Mother 31       breast cancer   Hypertension Mother    Diabetes Father    Cancer Sister        breast cancer   Breast cancer Sister    Cancer Sister        breast cancer  Breast cancer Sister      Social History   Socioeconomic History   Marital  status: Divorced    Spouse name: n/a   Number of children: 2   Years of education: 12   Highest education level: Not on file  Occupational History   Occupation: Retired    Associate Professor: [employer]    Comment: Diplomatic Services operational officer  Tobacco Use   Smoking status: Never   Smokeless tobacco: Never  Vaping Use   Vaping Use: Never used  Substance and Sexual Activity   Alcohol use: No   Drug use: No   Sexual activity: Not Currently    Partners: Male    Birth control/protection: Surgical, Post-menopausal    Comment: Hysterectomy  Other Topics Concern   Not on file  Social History Narrative   Marital status: divorced; dating seriously x many years.       Children:  2 children; 1 grandchild.      Employment: Research officer, political party.      Lives: alone      Tobacco: none       Alcohol: none      Exercise:  Walking with sister two days per week.      Seatbelt: 100%      Guns: none      Right-handed   Caffeine: occasional coffee, hot tea or green tea a few times per week   Social Determinants of Health   Financial Resource Strain: Not on file  Food Insecurity: Not on file  Transportation Needs: Not on file  Physical Activity: Not on file  Stress: Not on file  Social Connections: Not on file  Intimate Partner Violence: Not on file     BP (!) 110/58   Pulse 77   Ht 5' 7.5" (1.715 m)   Wt 182 lb (82.6 kg)   SpO2 99%   BMI 28.08 kg/m   Physical Exam:  Well appearing NAD HEENT: Unremarkable Neck:  No JVD, no thyromegally Lymphatics:  No adenopathy Back:  No CVA tenderness Lungs:  Clear with no wheezes HEART:  Regular rate rhythm, no murmurs, no rubs, no clicks Abd:  soft, positive bowel sounds, no organomegally, no rebound, no guarding Ext:  2 plus pulses, no edema, no cyanosis, no clubbing Skin:  No rashes no nodules Neuro:  CN II through XII intact, motor grossly intact  DEVICE  Normal device function.  See PaceArt for details.   Assess/Plan: Chronic systolic heart  failure - her symptoms are class 2. She will continue her current meds and I have encouraged regular exercise. ICD - her device Iis programmed VVI 60 and she is sensing appropriately and not pacing.  Breast CA - she is s/p bilateral mastectomy. She appears to have finally healed her incisions.   Sharlot Gowda Talishia Betzler,MD

## 2023-04-02 NOTE — Patient Instructions (Signed)
Medication Instructions:  Your physician recommends that you continue on your current medications as directed. Please refer to the Current Medication list given to you today.  *If you need a refill on your cardiac medications before your next appointment, please call your pharmacy*  Lab Work: None ordered.  If you have labs (blood work) drawn today and your tests are completely normal, you will receive your results only by: MyChart Message (if you have MyChart) OR A paper copy in the mail If you have any lab test that is abnormal or we need to change your treatment, we will call you to review the results.  Testing/Procedures: None ordered.  Follow-Up: At [facility], you and your health needs are our priority.  As part of our continuing mission to provide you with exceptional heart care, we have created designated Provider Care Teams.  These Care Teams include your primary Cardiologist (physician) and Advanced Practice Providers (APPs -  Physician Assistants and Nurse Practitioners) who all work together to provide you with the care you need, when you need it.  Your next appointment:   1 year(s)  The format for your next appointment:   In Person  Provider:   Lewayne Bunting, MD{or one of the following Advanced Practice Providers on your designated Care Team:   Francis Dowse, [place] Casimiro Needle "Mardelle Matte" Lanna Poche, [place]  Remote monitoring is used to monitor your ICD from home. This monitoring reduces the number of office visits required to check your device to one time per year. It allows [place] to keep an eye on the functioning of your device to ensure it is working properly. You are scheduled for a device check from home on 8/6. You may send your transmission at any time that day. If you have a wireless device, the transmission will be sent automatically. After your physician reviews your transmission, you will receive a postcard with your next transmission date.

## 2023-04-03 ENCOUNTER — Other Ambulatory Visit (HOSPITAL_COMMUNITY): Payer: Self-pay

## 2023-04-03 DIAGNOSIS — Z170 Estrogen receptor positive status [ER+]: Secondary | ICD-10-CM

## 2023-04-04 DIAGNOSIS — Z171 Estrogen receptor negative status [ER-]: Secondary | ICD-10-CM | POA: Diagnosis not present

## 2023-04-04 DIAGNOSIS — C50412 Malignant neoplasm of upper-outer quadrant of left female breast: Secondary | ICD-10-CM | POA: Diagnosis not present

## 2023-04-08 ENCOUNTER — Ambulatory Visit (HOSPITAL_COMMUNITY)
Admission: RE | Admit: 2023-04-08 | Discharge: 2023-04-08 | Disposition: A | Discharge status: Home / Self Care | Payer: 59 | Source: Ambulatory Visit | Attending: Internal Medicine | Admitting: Internal Medicine

## 2023-04-08 ENCOUNTER — Ambulatory Visit (HOSPITAL_BASED_OUTPATIENT_CLINIC_OR_DEPARTMENT_OTHER)
Admission: RE | Admit: 2023-04-08 | Discharge: 2023-04-08 | Disposition: A | Discharge status: Home / Self Care | Payer: 59 | Source: Ambulatory Visit | Attending: Internal Medicine | Admitting: Internal Medicine

## 2023-04-08 ENCOUNTER — Encounter (HOSPITAL_COMMUNITY): Payer: Self-pay | Admitting: Internal Medicine

## 2023-04-08 VITALS — BP 120/62 | HR 88 | Wt 181.8 lb

## 2023-04-08 DIAGNOSIS — I110 Hypertensive heart disease with heart failure: Secondary | ICD-10-CM | POA: Diagnosis not present

## 2023-04-08 DIAGNOSIS — C50412 Malignant neoplasm of upper-outer quadrant of left female breast: Secondary | ICD-10-CM | POA: Diagnosis not present

## 2023-04-08 DIAGNOSIS — E785 Hyperlipidemia, unspecified: Secondary | ICD-10-CM | POA: Insufficient documentation

## 2023-04-08 DIAGNOSIS — I428 Other cardiomyopathies: Secondary | ICD-10-CM

## 2023-04-08 DIAGNOSIS — E1136 Type 2 diabetes mellitus with diabetic cataract: Secondary | ICD-10-CM | POA: Insufficient documentation

## 2023-04-08 DIAGNOSIS — Z79899 Other long term (current) drug therapy: Secondary | ICD-10-CM | POA: Insufficient documentation

## 2023-04-08 DIAGNOSIS — Z171 Estrogen receptor negative status [ER-]: Secondary | ICD-10-CM | POA: Insufficient documentation

## 2023-04-08 DIAGNOSIS — Z01818 Encounter for other preprocedural examination: Secondary | ICD-10-CM | POA: Diagnosis not present

## 2023-04-08 DIAGNOSIS — C50411 Malignant neoplasm of upper-outer quadrant of right female breast: Secondary | ICD-10-CM

## 2023-04-08 DIAGNOSIS — Z170 Estrogen receptor positive status [ER+]: Secondary | ICD-10-CM

## 2023-04-08 DIAGNOSIS — I5022 Chronic systolic (congestive) heart failure: Secondary | ICD-10-CM

## 2023-04-08 DIAGNOSIS — I447 Left bundle-branch block, unspecified: Secondary | ICD-10-CM | POA: Insufficient documentation

## 2023-04-08 DIAGNOSIS — Z0189 Encounter for other specified special examinations: Secondary | ICD-10-CM | POA: Diagnosis not present

## 2023-04-08 DIAGNOSIS — Z7984 Long term (current) use of oral hypoglycemic drugs: Secondary | ICD-10-CM | POA: Diagnosis not present

## 2023-04-08 DIAGNOSIS — Z1501 Genetic susceptibility to malignant neoplasm of breast: Secondary | ICD-10-CM | POA: Insufficient documentation

## 2023-04-08 DIAGNOSIS — Z95810 Presence of automatic (implantable) cardiac defibrillator: Secondary | ICD-10-CM | POA: Insufficient documentation

## 2023-04-08 LAB — ECHOCARDIOGRAM COMPLETE
Calc EF: 36.5 %
S' Lateral: 3.9 cm
Single Plane A2C EF: 38.6 %
Single Plane A4C EF: 30.9 %

## 2023-04-08 NOTE — Patient Instructions (Signed)
There has been no changes to your medications.  Your physician has requested that you have an echocardiogram. Echocardiography is a painless test that uses sound waves to create images of your heart. It provides your doctor with information about the size and shape of your heart and how well your heart's chambers and valves are working. This procedure takes approximately one hour. There are no restrictions for this procedure. Please do NOT wear cologne, perfume, aftershave, or lotions (deodorant is allowed). Please arrive 15 minutes prior to your appointment time.  Your physician recommends that you schedule a follow-up appointment in: 3 months  If you have any questions or concerns before your next appointment please send [place] a message through [place] or call our office at [phone].    TO LEAVE A MESSAGE FOR THE NURSE SELECT OPTION 2, PLEASE LEAVE A MESSAGE INCLUDING: YOUR NAME DATE OF BIRTH CALL BACK NUMBER REASON FOR CALL**this is important as we prioritize the call backs  YOU WILL RECEIVE A CALL BACK THE SAME DAY AS LONG AS YOU CALL BEFORE 4:00 PM  At the [facility], you and your health needs are our priority. As part of our continuing mission to provide you with exceptional heart care, we have created designated Provider Care Teams. These Care Teams include your primary Cardiologist (physician) and Advanced Practice Providers (APPs- Physician Assistants and Nurse Practitioners) who all work together to provide you with the care you need, when you need it.   You may see any of the following providers on your designated Care Team at your next follow up: Dr Arvilla Meres Dr Marca Ancona Dr. Marcos Eke, NP Robbie Lis, [place] [facility] [place], [place] Brynda Peon, NP Karle Plumber, PharmD   Please be sure to bring in all your medications bottles to every appointment.    Thank you for choosing [facility]

## 2023-04-08 NOTE — Progress Notes (Signed)
CARDIO-ONCOLOGY CLINIC CONSULT NOTE  Referring Physician: Dr. Odis Hollingshead Primary Care: Merri Brunette, MD Primary Cardiologist: Dr. Odis Hollingshead Oncologist: Dr. Pamelia Hoit  HPI:    Jordan Willis is a 72 y.o. with LBBB, DM2,recurrent breast cancer (BRCA +) status post bilateral mastectomies, chronic systolic HF due to NICM referred by Dr. Odis Hollingshead for further evaluation of her HF.    Diagnosed with L breast CA in 2006. 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  ER 90%, PR 0%, Ki-67 70%, HER2 3+ positive Left breast mastectomy scar reexcision: Benign   Echo 6/22 EF 50-55%   Back in June 2022 she had low normal LVEF and after recurrence of breast cancer  cancer she was placed on Herceptin for approximately 3 months  Jan 2023-April 2023.  LVEF in April 2023 was noted to be 35-40% and Herceptin was discontinued.  Despite stopping Herceptin her LVEF has not improved.  She has been on maximally tolerated GDMT and also underwent BiV ICD placement in November 2023.   She had a repeat echocardiogram in January 2024 without any significant improvement in LVEF. She follows Dr. Pamelia Hoit from hematology/oncology and wanted to consider a trial of anti-HER2 therapy (Kadcyla).  After discussion w/ Dr. Pamelia Hoit given the risk of worsening cardiotoxicity the shared decision was to continue monitoring/surveillance.  Saw Dr. Ladona Ridgel in 3/24 her CS did not have an acceptable vein for  biv pacing and her left bundle area pacing was initially good but currently was only capturing the septum so not getting effective Biv pacing. Discussed option for lead revision. Saw Dr. Ladona Ridgel again in 6/24. Device stable decided not to upgrade device   Echo today 04/08/23  EF 30-35 with severe septal dyssynchrony  Here for f/u with her daughter. Now back on Herceptin since 4/24. Feels ok. Gets tired easily but can do ADLs. No CP. No edema, orthopnea or PND. SBP runs low.    Cardiac studies:  Echocardiogram: 03/29/2021:  50-55%, G1DD, moderate MR, Moderate TR, PASP .    06/15/2021: LVEF 30-35%, severe hypokinesis of the anteroseptal and apex, moderate to severe LV dysfunction, grade 1 diastolic dysfunction, elevated LAP, global longitudinal strain -16.7%, mild to moderate Mr.    10/09/2021: 45-50%, no regional wall motion abnormalities, global longitudinal strain -13.2%, mild MR.    01/29/2022: 35-40%, average GLS -10.3%, grade 1 diastolic impairment, severe hypokinesis of the left ventricle with regional wall motion abnormalities, moderately dilated left atrium, posterior pericardial effusion without tamponade, moderate to severe MR, estimated RAP 8 mmHg.   10/29/2022 EF 30-35% RV low normal   Stress Testing:  02/14/2018 exercise nuclear stress test at [facility]: Achieved 7 METS, normal blood pressure response to exercise, had exertional chest pain that improved/resolved during recovery, normal myocardial perfusion study no EKG abnormalities per report.   Heart Catheterization: Left Heart Catheterization 03/27/22: No CAD EF 30-35%   MUGA scan 05/10/2022: 1. Left ventricular ejection fraction is equal to 35.3%. 2. Moderate to marked septal  hypokinesis. 3. Left ventricular dilatation.   Past Medical History:  Diagnosis Date   AICD (automatic cardioverter/defibrillator) present    Anemia    Anginal pain (HCC)    Anxiety    Arthritis    Lumbar spine DDD.   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   Diabetes mellitus without complication (HCC)    Type II   Dyspnea    When patient gets sick uses Pro-Air inhaler   Frequent sinus infections    GERD (gastroesophageal reflux disease)    occ   Goiter    History of cardiomegaly    History of cataract    Bilateral   History of kidney stones    08/28/2018 currently has a large kidney stone, asymptomatic at this time   History of migraine    History of uterine fibroid    History of uterine prolapse    Hyperlipidemia    Hypertension    LBBB (left bundle branch block)    Menopause 03/03/2012   Nasal turbinate hypertrophy 11/29/2016   Left inferior    Personal history of chemotherapy    Personal history of radiation therapy    Pneumonia    as a child   PONV (postoperative nausea and vomiting)    Sinusitis 2014   Sleep apnea    No CPAP   SUI (stress urinary incontinence, female)    SVD (spontaneous vaginal delivery)    x 2   Thyroid nodule     Current Outpatient Medications  Medication Sig Dispense Refill   albuterol (VENTOLIN HFA) 108 (90 Base) MCG/ACT inhaler Inhale 2 puffs into the lungs every 6 (six) hours as needed for wheezing or shortness of breath.     b complex vitamins capsule Take 1 capsule by mouth daily.     baclofen (LIORESAL) 10 MG tablet Take 10 mg by mouth as needed for muscle spasms.     bumetanide (BUMEX) 1 MG tablet TAKE 1 TABLET BY MOUTH EVERY DAY 90 tablet 1   cetirizine (ZYRTEC) 10 MG tablet Take 10 mg by mouth in the morning.     Cholecalciferol (VITAMIN D3) 25 MCG (1000 UT) CAPS Take 1,000 Units by mouth in the morning.     diclofenac Sodium (VOLTAREN) 1 % GEL Apply 1 Application topically 4 (four) times daily as needed (pain).     exemestane (AROMASIN) 25 MG tablet Take 1 tablet (25 mg  total) by mouth daily after breakfast. 90 tablet 3   FARXIGA 10 MG TABS tablet TAKE 1 TABLET BY MOUTH EVERY DAY BEFORE BREAKFAST 90 tablet 0   fluticasone (FLONASE) 50 MCG/ACT nasal spray Place 1 spray into both nostrils in the morning.     GLUCOSAMINE-CHONDROITIN PO Take 1 tablet by mouth every evening.      glucose blood (ONETOUCH VERIO) test strip TEST ONCE DAILY AS DIRECTED 90     ibuprofen (ADVIL) 200 MG tablet Take 400 mg by mouth every 6 (six) hours as needed for mild pain or moderate pain.     metFORMIN (GLUCOPHAGE-XR) 500 MG 24 hr tablet Take 500 mg by mouth 2 (two) times daily.     metoprolol succinate (TOPROL-XL) 25 MG 24 hr tablet TAKE 1 TABLET (25 MG TOTAL) BY MOUTH IN THE MORNING. HOLD IF SYSTOLIC BLOOD PRESSURE (TOP BLOOD  PRESSURE NUMBER) LESS THAN 100 MMHG OR HEART RATE LESS THAN 60 BPM (PULSE). 90 tablet 3   Multiple Vitamin (MULTIVITAMIN WITH MINERALS) TABS tablet Take 1 tablet by mouth daily. Centrum Silver for Women 50+     nitroGLYCERIN (NITROSTAT) 0.4 MG SL tablet Place 0.4 mg under the tongue every 5 (five) minutes as needed for chest pain.     Olopatadine HCl 0.2 % SOLN Place 1 drop into both eyes daily. Pataday     ondansetron (ZOFRAN) 4 MG tablet Take 1 tablet (4 mg total) by mouth every 6 (six) hours as needed for nausea or vomiting. 20 tablet 0   OneTouch Delica Lancets 33G MISC Apply topically.     oxyCODONE (OXY IR/ROXICODONE) 5 MG immediate release tablet Take 1 tablet (5 mg total) by mouth every 6 (six) hours as needed for severe pain or moderate pain. 30 tablet 0   Probiotic Product (PROBIOTIC DAILY PO) Take 1 capsule by mouth in the morning.     rosuvastatin (CRESTOR) 20 MG tablet Take 20 mg by mouth at bedtime.     sacubitril-valsartan (ENTRESTO) 24-26 MG Take 1 tablet by mouth 2 (two) times daily. 60 tablet 6   sertraline (ZOLOFT) 50 MG tablet Take 50 mg by mouth in the morning.     spironolactone (ALDACTONE) 25 MG tablet TAKE 1 TABLET BY MOUTH EVERY DAY IN THE  MORNING 90 tablet 0   No current facility-administered medications for this encounter.    Allergies  Allergen Reactions   Bactrim [Sulfamethoxazole-Trimethoprim] Shortness Of Breath    Shortness of breath, difficulty breathing, headache, rash, fatigue   Aspirin Palpitations and Other (See Comments)    Heart fluttering, pt takes 40.5 mg (half of 81 mg) daily Other reaction(s): bradycardia, Not available   Sulfa Antibiotics Hives   Tape Rash    Tears skin off      Social History   Socioeconomic History   Marital status: Divorced    Spouse name: n/a   Number of children: 2   Years of education: 12   Highest education level: Not on file  Occupational History   Occupation: Retired    Associate Professor: [employer]    Comment: Diplomatic Services operational officer  Tobacco Use   Smoking status: Never   Smokeless tobacco: Never  Vaping Use   Vaping Use: Never used  Substance and Sexual Activity   Alcohol use: No   Drug use: No   Sexual activity: Not Currently    Partners: Male    Birth control/protection: Surgical, Post-menopausal    Comment: Hysterectomy  Other Topics Concern   Not on file  Social History Narrative   Marital status: divorced; dating seriously x many years.       Children:  2 children; 1 grandchild.      Employment: Research officer, political party.      Lives: alone      Tobacco: none       Alcohol: none      Exercise:  Walking with sister two days per week.      Seatbelt: 100%      Guns: none      Right-handed   Caffeine: occasional coffee, hot tea or green tea a few times per week   Social Determinants of Health   Financial Resource Strain: Not on file  Food Insecurity: Not on file  Transportation Needs: Not on file  Physical Activity: Not on file  Stress: Not on file  Social Connections: Not on file  Intimate Partner  Violence: Not on file      Family History  Problem Relation Age of Onset   Cancer Mother 27       breast cancer   Hypertension Mother    Diabetes Father     Cancer Sister        breast cancer   Breast cancer Sister    Cancer Sister        breast cancer   Breast cancer Sister     Vitals:   04/08/23 1401  BP: 120/62  Pulse: 88  SpO2: 99%  Weight: 82.5 kg (181 lb 12.8 oz)    PHYSICAL EXAM: General:  Elderly No resp difficulty HEENT: normal Neck: supple. no JVD. Carotids 2+ bilat; no bruits. No lymphadenopathy or thryomegaly appreciated. Cor: PMI nondisplaced. Regular rate & rhythm. No rubs, gallops or murmurs. Lungs: clear Abdomen: soft, nontender, nondistended. No hepatosplenomegaly. No bruits or masses. Good bowel sounds. Extremities: no cyanosis, clubbing, rash, edema Neuro: alert & orientedx3, cranial nerves grossly intact. moves all 4 extremities w/o difficulty. Affect pleasant   ASSESSMENT & PLAN:  1. Systolic HF due to NICM  - Echo 6/22: EF 50-55%, G1DD, moderate MR, Moderate TR, PASP .  - Echo 8.22: EF 30-35%, severe anteroseptal/apical HK, G1DD mild-mod MR - Echo 4/23: EF 35-40% G1DD mod-sev MR - MUGA 7/23 EF 35% Moderate to marked septal hypokinesis. - Cath 6/23 No CAD EF 30-35% - Echo 1/24 EF 30-35% RV low normal triv MR - Echo today 04/08/23  EF 30% Personally reviewed - h/o LBBB s/p failed CRT-D (unable to place CS lead successfully - Stable NYHA III - Volume status stable - In looking at her echos I suspect that majority of LV dysfunction is likely due to her LBBB and dyssynchrony but may have component of chemo-induced toxicity - Unable to get cMRI with ICD - Continue current GDMT with Marcelline Deist, spiro, Entresto and Toprol per Dr. Odis Hollingshead. - Echo today stable. Continue Herceptin. Follow echos closely while on therapy - If EF getting worse or more symptomatic can consider repeat attempt at adding LV or LBBB lead  2. Recurrent breast CA - plan as above - continue Herceptin  Arvilla Meres, MD  2:23 PM

## 2023-04-09 ENCOUNTER — Ambulatory Visit: Payer: 59 | Attending: Plastic Surgery | Admitting: Physical Therapy

## 2023-04-09 DIAGNOSIS — Z483 Aftercare following surgery for neoplasm: Secondary | ICD-10-CM | POA: Diagnosis not present

## 2023-04-09 DIAGNOSIS — M25611 Stiffness of right shoulder, not elsewhere classified: Secondary | ICD-10-CM | POA: Diagnosis not present

## 2023-04-09 DIAGNOSIS — Z9189 Other specified personal risk factors, not elsewhere classified: Secondary | ICD-10-CM | POA: Diagnosis not present

## 2023-04-09 DIAGNOSIS — Z9889 Other specified postprocedural states: Secondary | ICD-10-CM | POA: Insufficient documentation

## 2023-04-09 DIAGNOSIS — Z9013 Acquired absence of bilateral breasts and nipples: Secondary | ICD-10-CM | POA: Diagnosis not present

## 2023-04-09 DIAGNOSIS — M25612 Stiffness of left shoulder, not elsewhere classified: Secondary | ICD-10-CM | POA: Insufficient documentation

## 2023-04-09 NOTE — Therapy (Signed)
[facility] [address] [place], [place], [address] Phone: [phone]   Fax:  [phone]  Patient Details  Name: Jordan Willis MRN: [id number] Date of Birth: Oct 05, 1951 Referring Provider:  Santiago Glad, MD  Encounter Date: 04/09/2023   OUTPATIENT PHYSICAL THERAPY ONCOLOGY EVALUATION  Patient Name: Jordan Willis MRN: [id number] DOB:[birth year], 72 y.o., female Today's Date: 04/09/2023  END OF SESSION:  PT End of Session - 04/09/23 1037     Visit Number 1    Number of Visits 17    Date for PT Re-Evaluation 06/10/23    PT Start Time 0915    PT Stop Time 0945    PT Time Calculation (min) 30 min    Activity Tolerance Patient limited by fatigue    Behavior During Therapy [facility] for tasks assessed/performed             Past Medical History:  Diagnosis Date   AICD (automatic cardioverter/defibrillator) present    Anemia    Anginal pain (HCC)    Anxiety    Arthritis    Lumbar spine DDD.   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   Diabetes mellitus without complication (HCC)    Type II   Dyspnea    When patient gets sick uses Pro-Air inhaler   Frequent sinus infections    GERD (gastroesophageal reflux disease)    occ   Goiter    History of cardiomegaly    History of cataract    Bilateral   History of kidney stones    08/28/2018 currently has a large kidney stone, asymptomatic at this time   History of migraine    History of uterine fibroid    History of uterine prolapse    Hyperlipidemia    Hypertension    LBBB (left bundle branch block)    Menopause 03/03/2012   Nasal turbinate hypertrophy 11/29/2016   Left inferior    Personal history of chemotherapy    Personal history of radiation therapy    Pneumonia    as a child   PONV  (postoperative nausea and vomiting)    Sinusitis 2014   Sleep apnea    No CPAP   SUI (stress urinary incontinence, female)    SVD (spontaneous vaginal delivery)    x 2   Thyroid nodule    Past Surgical History:  Procedure Laterality Date   ABDOMINAL HYSTERECTOMY     BILATERAL SALPINGOOPHORECTOMY Bilateral    BIV ICD INSERTION CRT-D N/A 08/24/2022   Procedure: BIV ICD INSERTION CRT-D;  Surgeon: Marinus Maw, MD;  Location: [facility] INVASIVE CV LAB;  Service: Cardiovascular;  Laterality: N/A;   BLADDER SUSPENSION N/A 05/16/2017   Procedure: TRANSVAGINAL TAPE (TVT) PROCEDURE;  Surgeon: Osborn Coho, MD;  Location: [facility] ORS;  Service: Gynecology;  Laterality: N/A;   BREAST BIOPSY Right 2009   BREAST IMPLANT REMOVAL Bilateral 02/05/2022   Procedure: Removal bilateral breast implants;  Surgeon: Allena Napoleon, MD;  Location: [facility] OR;  Service: Plastics;  Laterality: Bilateral;   BREAST LUMPECTOMY Right 2009   BREAST LUMPECTOMY Left 2006   BREAST RECONSTRUCTION WITH PLACEMENT OF TISSUE EXPANDER AND FLEX HD (ACELLULAR HYDRATED DERMIS) Bilateral 09/04/2021   Procedure: BILATERAL BREAST RECONSTRUCTION WITH PLACEMENT OF TISSUE EXPANDER AND FLEX HD (ACELLULAR HYDRATED DERMIS);  Surgeon: Allena Napoleon, MD;  Location: [facility] OR;  Service: Plastics;  Laterality: Bilateral;   CARDIAC CATHETERIZATION     CATARACT EXTRACTION Left 10/23/2017   CATARACT EXTRACTION Right 11/06/2017   COLONOSCOPY  10/22/2009   Normal.  Repeat 5 years.  [name]   COLONOSCOPY WITH PROPOFOL N/A 12/21/2022   Procedure: COLONOSCOPY WITH PROPOFOL;  Surgeon: Jeani Hawking, MD;  Location: [facility] ENDOSCOPY;  Service: Gastroenterology;  Laterality: N/A;   CYSTOSCOPY N/A 05/16/2017   Procedure: CYSTOSCOPY;  Surgeon: Osborn Coho, MD;  Location: [facility] ORS;  Service: Gynecology;  Laterality: N/A;   DEBRIDEMENT AND CLOSURE WOUND Bilateral 10/03/2021   Procedure: Debridement bilateral mastectomy flaps;  Surgeon: Allena Napoleon, MD;  Location: [facility]  OR;  Service: Plastics;  Laterality: Bilateral;  1.5 hour   EYE SURGERY     bilateral cataracts   LEFT HEART CATH AND CORONARY ANGIOGRAPHY N/A 02/09/2020   Procedure: LEFT HEART CATH AND CORONARY ANGIOGRAPHY;  Surgeon: Elder Negus, MD;  Location: [facility] INVASIVE CV LAB;  Service: Cardiovascular;  Laterality: N/A;   LEFT HEART CATH AND CORONARY ANGIOGRAPHY N/A 03/27/2022   Procedure: LEFT HEART CATH AND CORONARY ANGIOGRAPHY;  Surgeon: Yates Decamp, MD;  Location: [facility] INVASIVE CV LAB;  Service: Cardiovascular;  Laterality: N/A;   MASS EXCISION Right 12/04/2022   Procedure: EXCISION OF RIGHT CHEST WALL MASS;  Surgeon: Almond Lint, MD;  Location: [facility] OR;  Service: General;  Laterality: Right;   MASTECTOMY W/ SENTINEL NODE BIOPSY Right 09/04/2021   Procedure: BILATERAL MASTECTOMIES WITH RIGHT SENTINEL LYMPH NODE BIOPSY;  Surgeon: Almond Lint, MD;  Location: [facility] OR;  Service: General;  Laterality: Right;   REMOVAL OF BILATERAL TISSUE EXPANDERS WITH PLACEMENT OF BILATERAL BREAST IMPLANTS Bilateral 10/03/2021   Procedure: REMOVAL OF BILATERAL TISSUE EXPANDERS WITH REPLACEMENT OF TISSUE EXPANDERS;  Surgeon: Allena Napoleon, MD;  Location: [facility] OR;  Service: Plastics;  Laterality: Bilateral;   REMOVAL OF BILATERAL TISSUE EXPANDERS WITH PLACEMENT OF BILATERAL BREAST IMPLANTS Bilateral 12/19/2021   Procedure: REMOVAL OF BILATERAL TISSUE EXPANDERS WITH PLACEMENT OF BILATERAL BREAST IMPLANTS;  Surgeon: Allena Napoleon, MD;  Location: [facility] OR;  Service: Plastics;  Laterality: Bilateral;   ROBOTIC ASSISTED TOTAL HYSTERECTOMY N/A 05/16/2017   Procedure: ROBOTIC ASSISTED TOTAL HYSTERECTOMY;  Surgeon: Silverio Lay, MD;  Location: [facility] ORS;  Service: Gynecology;  Laterality: N/A;   ROTATOR CUFF REPAIR Bilateral    SCAR REVISION N/A 12/04/2022   Procedure: REVISION OF MASTECTOMY SCAR;  Surgeon: Almond Lint, MD;  Location: [facility] OR;  Service: General;  Laterality: N/A;   SHOULDER ARTHROSCOPY WITH ROTATOR CUFF REPAIR AND  SUBACROMIAL DECOMPRESSION Right 09/03/2018   Procedure: Right shoulder manipulation under anesthesia, exam under anesthesia, mini open rotator cuff repair, subacromial decompression;  Surgeon: Jene Every, MD;  Location: [facility] ORS;  Service: Orthopedics;  Laterality: Right;  90 mins   TUBAL LIGATION     WISDOM TOOTH EXTRACTION     Patient Active Problem List   Diagnosis Date Noted   ICD (implantable cardioverter-defibrillator) in place 12/25/2022   Bradycardia 08/24/2022   BRCA2 gene mutation positive in female 07/30/2022   LBBB (left bundle branch block)    Chronic diastolic heart failure (HCC)    NICM (nonischemic cardiomyopathy) (HCC)    Breast cancer of upper-outer quadrant of right female breast (HCC) 09/04/2021   Diabetic neuropathy (HCC) 02/08/2021   Angina pectoris (HCC) 02/09/2020   Abnormal stress test 02/09/2020   Complete rotator cuff tear 09/03/2018   Pelvic prolapse 05/16/2017  Intractable chronic post-traumatic headache 11/19/2016   Malignant neoplasm of upper-outer quadrant of left breast in female, estrogen receptor negative (HCC) 03/03/2012   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   Menopause 03/03/2012    PCP: Merri Brunette, MD  REFERRING PROVIDER: Santiago Glad, MD  REFERRING DIAG: breast cancer, stiffness in shoulders  Z98.890 (ICD-10-CM) - S/P scar revision THERAPY DIAG:  History of bilateral mastectomy  Aftercare following surgery for neoplasm  At risk for lymphedema  Stiffness of left shoulder, not elsewhere classified  Stiffness of right shoulder, not elsewhere classified  ONSET DATE:  February 2024   Rationale for Evaluation and Treatment: Rehabilitation  SUBJECTIVE:                                                                                                                                                                                           SUBJECTIVE STATEMENT: Pt states her doctor said its ok for her start  getting therapy again. She wants to be able to get her arms up over her head. She feels like she has a tight towel across her chest .  PERTINENT HISTORY: Pt was diagnosed with right breast cancer dx 05/2021. She has BRCA2 genetic mutation and has had left breast cancer in 2006 with chemo/breast conservation and XRT. She then had right breast cancer (dcis) with lumpectomy and radiation only. Dr. Lurene Shadow was her surgeon. She presented this time with a palpable painful mass in the upper outer right breast. Dx imaging showed a 1.2 cm mass at 10 o'clock. 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 The cardiomyopathy recovered, but the neuropathy did not. Her sister and mother have both had breast cancer. Pt underwent bilateral mastectomies with right sentinel node biopsy and expander reconstruction (Pace) 09/04/21. Final path at that time was [id number] with 6 negative nodes and negative margins. Patient ended up having the expanders removed because of continued drainage and discomfort.  2.14.2024 : revision of left mastectomy scar and removal of mass on right lateral mastectomy scar. She was found to have a nodule there and diagnostic imaging was performed. Ultrasound showed a 1.3 cm irregular mass at 9:00. Core needle biopsy was performed and this demonstrated invasive ductal carcinoma, grade 3. This was weakly ER positive, PR negative, HER2 positive, Ki-67 70%. She is currently undergoing chemotherapy   Pt also has a pacemaker  and defibrillator.  She has a decrease ejection fraction of 30% likely exacerbated by the chemotherapy.    Review of Systems:  PAIN:  Are you having pain?  No She feels that she has tightness in her neck from stress  She rates the tightness across her chest at 7/10  PRECAUTIONS: Fall and Other: at risk for lymphedema, cardiac precautions   WEIGHT BEARING RESTRICTIONS: No  FALLS:  Has  patient fallen in last 6 months? No  LIVING ENVIRONMENT: Lives with: lives with their family OCCUPATION: does not work   LEISURE: wants to go to [employer] she can only walk one block before she fatigues   HAND DOMINANCE: right   PRIOR LEVEL OF FUNCTION: Independent though limited by fatigue   PATIENT GOALS: to get things back on the top shelf of her cabinets    OBJECTIVE:  COGNITION: Overall cognitive status: Within functional limits for tasks assessed   PALPATION: Tight limited skin excursion across both sides of her chest with limited scar mobility   OBSERVATIONS / OTHER ASSESSMENTS: pt with visible tightness at chest with contraction of scars and fullness above scars    POSTURE: forward head rounded shoulders   UPPER EXTREMITY AROM/PROM:  A/PROM RIGHT   eval   Shoulder extension 20  Shoulder flexion 110  Shoulder abduction 90  Shoulder internal rotation 20 with pain   Shoulder external rotation 90    (Blank rows = not tested)  A/PROM LEFT   eval  Shoulder extension 20  Shoulder flexion 120  Shoulder abduction 95  Shoulder internal rotation 5 with pain   Shoulder external rotation 90    (Blank rows = not tested)   UPPER EXTREMITY STRENGTH: less that 3/5 as pt is not able to lift arms to end range of her available range of motion, diffuse muscle atrophy visible   L LYMPHEDEMA ASSESSMENTS:   SURGERY TYPE/DATE: multiple surgeries 2006, 2022, 2024  NUMBER OF LYMPH NODES REMOVED: 6  CHEMOTHERAPY: yes, ongoing   RADIATION:yes  HORMONE TREATMENT: yes   INFECTIONS: no  LYMPHEDEMA ASSESSMENTS:   LANDMARK RIGHT  eval  10 cm proximal to olecranon process 35  Olecranon process 28  10 cm proximal to ulnar styloid process 22.7  Just proximal to ulnar styloid process 17.5  Across hand at thumb web space 20  At base of 2nd digit 6.5  (Blank rows = not tested)  LANDMARK LEFT  eval  10 cm proximal to olecranon process 35  Olecranon process 27.5   10 cm proximal to ulnar styloid process 21.5  Just proximal to ulnar styloid process 17.5  Across hand at thumb web space 20  At base of 2nd digit 6.5  (Blank rows = not tested)  FUNCTIONAL TESTS:  6 reps in  30 seconds chair stand test  " pt very slow      QUICK DASH SURVEY: 45.45   TODAY'S TREATMENT:  DATE: 04/09/2023 evaluation   PATIENT EDUCATION:  Education details: goals of therapy  Person educated: Patient Education method: Explanation Education comprehension: verbalized understanding  HOME EXERCISE PROGRAM: Pt encouraged to do the broomstick exercises and shoulder exercises she has previously been doing   ASSESSMENT:  CLINICAL IMPRESSION: Patient is a 72 y.o. female  who was seen today for physical therapy evaluation and treatment for stiffness of both shoulders following multiple surgeries for breast cancer   OBJECTIVE IMPAIRMENTS: cardiopulmonary status limiting activity, decreased activity tolerance, decreased endurance, decreased ROM, decreased strength, increased fascial restrictions, impaired perceived functional ability, impaired UE functional use, and postural dysfunction.   ACTIVITY LIMITATIONS: carrying, lifting, standing, reach over head, and locomotion level  PARTICIPATION LIMITATIONS: meal prep, cleaning, laundry, and yard work  PERSONAL FACTORS: Age, Past/current experiences, and 3+ comorbidities: previous surgery, decreased cardiac status, onling chemotherapy   are also affecting patient's functional outcome.   REHAB POTENTIAL: Good  CLINICAL DECISION MAKING: Stable/uncomplicated  EVALUATION COMPLEXITY: Low  GOALS: Goals reviewed with patient? Yes  SHORT TERM GOALS: Target date: 05/09/2023  Pt will be independent in a basic exercise program for shoulder ROM and Strenth  Baseline:needs review Goal status:  INITIAL  LONG TERM GOALS: Target date: 8/18.2024  Pt will decrease quick DASH score to < 20 indicating an improvement in functional use of her UE's Baseline: 45.45 Goal status: INITIAL  2.  Pt will have increased shoulder flextion of >  135 degrees in both shoulders so that she can use her arms better at home  Baseline: 110 on right and 120 on left  Goal status: INITIAL  3.  Pt will report that she can put things on the top shelf of her cabinets at home without using a stepstool  Baseline: unable  Goal status: INITIAL  4.  Pt will increase to 10 reps of sit to stand in 30 seconds indicating an improvement in activity tolerance and functional strength  Baseline: 6 Goal status: INITIAL  PLAN:  PT FREQUENCY: 1-2x/week  PT DURATION: 8 weeks  PLANNED INTERVENTIONS: Therapeutic exercises, Therapeutic activity, Neuromuscular re-education, Balance training, Gait training, Patient/Family education, Self Care, Joint mobilization, scar mobilization, and Manual therapy  PLAN FOR NEXT SESSION: maual work for soft tissue and scar mobilization, passice and active excise for shoulders eventually add nustep and functional endurance exercise   Rosey Bath K. Manson Passey PT  Donnetta Hail, PT 04/09/2023, 11:04 AM   [facility] [address] [place], [place], [address] Phone: [phone]   Fax:  [phone]

## 2023-04-11 ENCOUNTER — Other Ambulatory Visit: Payer: Self-pay

## 2023-04-11 ENCOUNTER — Inpatient Hospital Stay: Payer: 59 | Attending: Hematology and Oncology

## 2023-04-11 ENCOUNTER — Encounter: Payer: Self-pay | Admitting: *Deleted

## 2023-04-11 VITALS — BP 132/58 | HR 72 | Temp 97.8°F | Resp 16

## 2023-04-11 DIAGNOSIS — Z171 Estrogen receptor negative status [ER-]: Secondary | ICD-10-CM | POA: Insufficient documentation

## 2023-04-11 DIAGNOSIS — Z1501 Genetic susceptibility to malignant neoplasm of breast: Secondary | ICD-10-CM | POA: Diagnosis not present

## 2023-04-11 DIAGNOSIS — Z803 Family history of malignant neoplasm of breast: Secondary | ICD-10-CM | POA: Insufficient documentation

## 2023-04-11 DIAGNOSIS — Z79811 Long term (current) use of aromatase inhibitors: Secondary | ICD-10-CM | POA: Insufficient documentation

## 2023-04-11 DIAGNOSIS — C50412 Malignant neoplasm of upper-outer quadrant of left female breast: Secondary | ICD-10-CM | POA: Insufficient documentation

## 2023-04-11 DIAGNOSIS — Z853 Personal history of malignant neoplasm of breast: Secondary | ICD-10-CM | POA: Insufficient documentation

## 2023-04-11 DIAGNOSIS — Z923 Personal history of irradiation: Secondary | ICD-10-CM | POA: Insufficient documentation

## 2023-04-11 DIAGNOSIS — Z1509 Genetic susceptibility to other malignant neoplasm: Secondary | ICD-10-CM | POA: Insufficient documentation

## 2023-04-11 DIAGNOSIS — Z9013 Acquired absence of bilateral breasts and nipples: Secondary | ICD-10-CM | POA: Insufficient documentation

## 2023-04-11 MED ORDER — TRASTUZUMAB-HYALURONIDASE-OYSK 600-10000 MG-UNT/5ML ~~LOC~~ SOLN
600.0000 mg | Freq: Once | SUBCUTANEOUS | Status: AC
  Administered 2023-04-11: 600 mg via SUBCUTANEOUS
  Filled 2023-04-11: qty 5

## 2023-04-11 MED ORDER — ACETAMINOPHEN 325 MG PO TABS
650.0000 mg | ORAL_TABLET | Freq: Once | ORAL | Status: AC
  Administered 2023-04-11: 650 mg via ORAL
  Filled 2023-04-11: qty 2

## 2023-04-11 MED ORDER — DIPHENHYDRAMINE HCL 25 MG PO CAPS
25.0000 mg | ORAL_CAPSULE | Freq: Once | ORAL | Status: AC
  Administered 2023-04-11: 25 mg via ORAL
  Filled 2023-04-11: qty 1

## 2023-04-11 NOTE — Progress Notes (Signed)
Ok to continue Trastuzumab per Dr. Pamelia Hoit with Echo from 04/08/23  EF 30-35% Also cleared by cardiology.

## 2023-04-11 NOTE — Progress Notes (Signed)
Timing error--these VS were obtained at approx 0945

## 2023-04-11 NOTE — Patient Instructions (Addendum)
[facility]  Discharge Instructions: Thank you for choosing [facility] to provide your oncology and hematology care.   If you have a lab appointment with the Cancer Center, please go directly to the Cancer Center and check in at the registration area.   Wear comfortable clothing and clothing appropriate for easy access to any Portacath or PICC line.   We strive to give you quality time with your provider. You may need to reschedule your appointment if you arrive late (15 or more minutes).  Arriving late affects you and other patients whose appointments are after yours.  Also, if you miss three or more appointments without notifying the office, you may be dismissed from the clinic at the provider's discretion.      For prescription refill requests, have your pharmacy contact our office and allow 72 hours for refills to be completed.    Today you received the following chemotherapy and/or immunotherapy agents: Herceptin hylecta      To help prevent nausea and vomiting after your treatment, we encourage you to take your nausea medication as directed.  BELOW ARE SYMPTOMS THAT SHOULD BE REPORTED IMMEDIATELY: *FEVER GREATER THAN 100.4 F (38 C) OR HIGHER *CHILLS OR SWEATING *NAUSEA AND VOMITING THAT IS NOT CONTROLLED WITH YOUR NAUSEA MEDICATION *UNUSUAL SHORTNESS OF BREATH *UNUSUAL BRUISING OR BLEEDING *URINARY PROBLEMS (pain or burning when urinating, or frequent urination) *BOWEL PROBLEMS (unusual diarrhea, constipation, pain near the anus) TENDERNESS IN MOUTH AND THROAT WITH OR WITHOUT PRESENCE OF ULCERS (sore throat, sores in mouth, or a toothache) UNUSUAL RASH, SWELLING OR PAIN  UNUSUAL VAGINAL DISCHARGE OR ITCHING   Items with * indicate a potential emergency and should be followed up as soon as possible or go to the Emergency Department if any problems should occur.  Please show the CHEMOTHERAPY ALERT CARD or IMMUNOTHERAPY ALERT CARD  at check-in to the Emergency Department and triage nurse.  Should you have questions after your visit or need to cancel or reschedule your appointment, please contact [facility]  Dept: [phone]  and follow the prompts.  Office hours are 8:00 a.m. to 4:30 p.m. Monday - Friday. Please note that voicemails left after 4:00 p.m. may not be returned until the following business day.  We are closed weekends and major holidays. You have access to a nurse at all times for urgent questions. Please call the main number to the clinic Dept: [phone] and follow the prompts.   For any non-urgent questions, you may also contact your provider using MyChart. We now offer e-Visits for anyone 18 and older to request care online for non-urgent symptoms. For details visit [email or web address].   Also download the MyChart app! Go to the app store, search "MyChart", open the app, select [place] [facility], and log in with your MyChart username and password.  

## 2023-04-12 ENCOUNTER — Encounter (HOSPITAL_COMMUNITY)
Admission: RE | Admit: 2023-04-12 | Discharge: 2023-04-12 | Disposition: A | Discharge status: Home / Self Care | Payer: 59 | Source: Ambulatory Visit | Attending: Adult Health | Admitting: Adult Health

## 2023-04-12 DIAGNOSIS — C419 Malignant neoplasm of bone and articular cartilage, unspecified: Secondary | ICD-10-CM | POA: Diagnosis not present

## 2023-04-12 DIAGNOSIS — Z171 Estrogen receptor negative status [ER-]: Secondary | ICD-10-CM | POA: Insufficient documentation

## 2023-04-12 DIAGNOSIS — C50919 Malignant neoplasm of unspecified site of unspecified female breast: Secondary | ICD-10-CM | POA: Diagnosis not present

## 2023-04-12 DIAGNOSIS — C50412 Malignant neoplasm of upper-outer quadrant of left female breast: Secondary | ICD-10-CM | POA: Insufficient documentation

## 2023-04-12 LAB — SIGNATERA
SIGNATERA MTM READOUT: 0 MTM/ml
SIGNATERA TEST RESULT: NEGATIVE

## 2023-04-12 MED ORDER — TECHNETIUM TC 99M MEDRONATE IV KIT
20.0000 | PACK | Freq: Once | INTRAVENOUS | Status: AC
  Administered 2023-04-12: 19.5 via INTRAVENOUS

## 2023-04-15 ENCOUNTER — Encounter: Payer: Self-pay | Admitting: Physical Therapy

## 2023-04-15 ENCOUNTER — Ambulatory Visit: Payer: 59 | Admitting: Physical Therapy

## 2023-04-15 DIAGNOSIS — Z9189 Other specified personal risk factors, not elsewhere classified: Secondary | ICD-10-CM | POA: Diagnosis not present

## 2023-04-15 DIAGNOSIS — Z483 Aftercare following surgery for neoplasm: Secondary | ICD-10-CM

## 2023-04-15 DIAGNOSIS — Z9013 Acquired absence of bilateral breasts and nipples: Secondary | ICD-10-CM | POA: Diagnosis not present

## 2023-04-15 DIAGNOSIS — M25611 Stiffness of right shoulder, not elsewhere classified: Secondary | ICD-10-CM

## 2023-04-15 DIAGNOSIS — Z9889 Other specified postprocedural states: Secondary | ICD-10-CM | POA: Diagnosis not present

## 2023-04-15 DIAGNOSIS — M25612 Stiffness of left shoulder, not elsewhere classified: Secondary | ICD-10-CM | POA: Diagnosis not present

## 2023-04-15 NOTE — Therapy (Signed)
[facility] [address] [place], [place], [address] Phone: [phone]   Fax:  [phone]  Patient Details  Name: Jordan Willis MRN: [id number] Date of Birth: Sep 05, 1951 Referring Provider:  Santiago Glad, MD  Encounter Date: 04/15/2023   OUTPATIENT PHYSICAL THERAPY ONCOLOGY TREATMENT  Patient Name: Jordan Willis MRN: [id number] DOB:05-28-1951, 72 y.o., female Today's Date: 04/15/2023  END OF SESSION:  PT End of Session - 04/15/23 1445     Visit Number 2    Number of Visits 17    Date for PT Re-Evaluation 06/10/23    PT Start Time 1402    PT Stop Time 1442    PT Time Calculation (min) 40 min    Activity Tolerance Patient limited by pain    Behavior During Therapy [facility] for tasks assessed/performed              Past Medical History:  Diagnosis Date   AICD (automatic cardioverter/defibrillator) present    Anemia    Anginal pain (HCC)    Anxiety    Arthritis    Lumbar spine DDD.   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   Diabetes mellitus without complication (HCC)    Type II   Dyspnea    When patient gets sick uses Pro-Air inhaler   Frequent sinus infections    GERD (gastroesophageal reflux disease)    occ   Goiter    History of cardiomegaly    History of cataract    Bilateral   History of kidney stones    08/28/2018 currently has a large kidney stone, asymptomatic at this time   History of migraine    History of uterine fibroid    History of uterine prolapse    Hyperlipidemia    Hypertension    LBBB (left bundle branch block)    Menopause 03/03/2012   Nasal turbinate hypertrophy 11/29/2016   Left inferior    Personal history of chemotherapy    Personal history of radiation therapy    Pneumonia    as a child   PONV  (postoperative nausea and vomiting)    Sinusitis 2014   Sleep apnea    No CPAP   SUI (stress urinary incontinence, female)    SVD (spontaneous vaginal delivery)    x 2   Thyroid nodule    Past Surgical History:  Procedure Laterality Date   ABDOMINAL HYSTERECTOMY     BILATERAL SALPINGOOPHORECTOMY Bilateral    BIV ICD INSERTION CRT-D N/A 08/24/2022   Procedure: BIV ICD INSERTION CRT-D;  Surgeon: Marinus Maw, MD;  Location: [facility] INVASIVE CV LAB;  Service: Cardiovascular;  Laterality: N/A;   BLADDER SUSPENSION N/A 05/16/2017   Procedure: TRANSVAGINAL TAPE (TVT) PROCEDURE;  Surgeon: Osborn Coho, MD;  Location: [facility] ORS;  Service: Gynecology;  Laterality: N/A;   BREAST BIOPSY Right 2009   BREAST IMPLANT REMOVAL Bilateral 02/05/2022   Procedure: Removal bilateral breast implants;  Surgeon: Allena Napoleon, MD;  Location: [facility] OR;  Service: Plastics;  Laterality: Bilateral;   BREAST LUMPECTOMY Right 2009   BREAST LUMPECTOMY Left 2006   BREAST RECONSTRUCTION WITH PLACEMENT OF TISSUE EXPANDER AND FLEX HD (ACELLULAR HYDRATED DERMIS) Bilateral 09/04/2021   Procedure: BILATERAL BREAST RECONSTRUCTION WITH PLACEMENT OF TISSUE EXPANDER AND FLEX HD (ACELLULAR HYDRATED DERMIS);  Surgeon: Allena Napoleon, MD;  Location: [facility] OR;  Service: Plastics;  Laterality: Bilateral;   CARDIAC CATHETERIZATION     CATARACT EXTRACTION Left 10/23/2017   CATARACT EXTRACTION Right 11/06/2017   COLONOSCOPY  10/22/2009   Normal.  Repeat 5 years.  [name]   COLONOSCOPY WITH PROPOFOL N/A 12/21/2022   Procedure: COLONOSCOPY WITH PROPOFOL;  Surgeon: Jeani Hawking, MD;  Location: [facility] ENDOSCOPY;  Service: Gastroenterology;  Laterality: N/A;   CYSTOSCOPY N/A 05/16/2017   Procedure: CYSTOSCOPY;  Surgeon: Osborn Coho, MD;  Location: [facility] ORS;  Service: Gynecology;  Laterality: N/A;   DEBRIDEMENT AND CLOSURE WOUND Bilateral 10/03/2021   Procedure: Debridement bilateral mastectomy flaps;  Surgeon: Allena Napoleon, MD;  Location: [facility]  OR;  Service: Plastics;  Laterality: Bilateral;  1.5 hour   EYE SURGERY     bilateral cataracts   LEFT HEART CATH AND CORONARY ANGIOGRAPHY N/A 02/09/2020   Procedure: LEFT HEART CATH AND CORONARY ANGIOGRAPHY;  Surgeon: Elder Negus, MD;  Location: [facility] INVASIVE CV LAB;  Service: Cardiovascular;  Laterality: N/A;   LEFT HEART CATH AND CORONARY ANGIOGRAPHY N/A 03/27/2022   Procedure: LEFT HEART CATH AND CORONARY ANGIOGRAPHY;  Surgeon: Yates Decamp, MD;  Location: [facility] INVASIVE CV LAB;  Service: Cardiovascular;  Laterality: N/A;   MASS EXCISION Right 12/04/2022   Procedure: EXCISION OF RIGHT CHEST WALL MASS;  Surgeon: Almond Lint, MD;  Location: [facility] OR;  Service: General;  Laterality: Right;   MASTECTOMY W/ SENTINEL NODE BIOPSY Right 09/04/2021   Procedure: BILATERAL MASTECTOMIES WITH RIGHT SENTINEL LYMPH NODE BIOPSY;  Surgeon: Almond Lint, MD;  Location: [facility] OR;  Service: General;  Laterality: Right;   REMOVAL OF BILATERAL TISSUE EXPANDERS WITH PLACEMENT OF BILATERAL BREAST IMPLANTS Bilateral 10/03/2021   Procedure: REMOVAL OF BILATERAL TISSUE EXPANDERS WITH REPLACEMENT OF TISSUE EXPANDERS;  Surgeon: Allena Napoleon, MD;  Location: [facility] OR;  Service: Plastics;  Laterality: Bilateral;   REMOVAL OF BILATERAL TISSUE EXPANDERS WITH PLACEMENT OF BILATERAL BREAST IMPLANTS Bilateral 12/19/2021   Procedure: REMOVAL OF BILATERAL TISSUE EXPANDERS WITH PLACEMENT OF BILATERAL BREAST IMPLANTS;  Surgeon: Allena Napoleon, MD;  Location: [facility] OR;  Service: Plastics;  Laterality: Bilateral;   ROBOTIC ASSISTED TOTAL HYSTERECTOMY N/A 05/16/2017   Procedure: ROBOTIC ASSISTED TOTAL HYSTERECTOMY;  Surgeon: Silverio Lay, MD;  Location: [facility] ORS;  Service: Gynecology;  Laterality: N/A;   ROTATOR CUFF REPAIR Bilateral    SCAR REVISION N/A 12/04/2022   Procedure: REVISION OF MASTECTOMY SCAR;  Surgeon: Almond Lint, MD;  Location: [facility] OR;  Service: General;  Laterality: N/A;   SHOULDER ARTHROSCOPY WITH ROTATOR CUFF REPAIR AND  SUBACROMIAL DECOMPRESSION Right 09/03/2018   Procedure: Right shoulder manipulation under anesthesia, exam under anesthesia, mini open rotator cuff repair, subacromial decompression;  Surgeon: Jene Every, MD;  Location: [facility] ORS;  Service: Orthopedics;  Laterality: Right;  90 mins   TUBAL LIGATION     WISDOM TOOTH EXTRACTION     Patient Active Problem List   Diagnosis Date Noted   ICD (implantable cardioverter-defibrillator) in place 12/25/2022   Bradycardia 08/24/2022   BRCA2 gene mutation positive in female 07/30/2022   LBBB (left bundle branch block)    Chronic diastolic heart failure (HCC)    NICM (nonischemic cardiomyopathy) (HCC)    Breast cancer of upper-outer quadrant of right female breast (HCC) 09/04/2021   Diabetic neuropathy (HCC) 02/08/2021   Angina pectoris (HCC) 02/09/2020   Abnormal stress test 02/09/2020   Complete rotator cuff tear 09/03/2018   Pelvic prolapse 05/16/2017  Intractable chronic post-traumatic headache 11/19/2016   Malignant neoplasm of upper-outer quadrant of left breast in female, estrogen receptor negative (HCC) 03/03/2012   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   Menopause 03/03/2012    PCP: Merri Brunette, MD  REFERRING PROVIDER: Santiago Glad, MD  REFERRING DIAG: breast cancer, stiffness in shoulders  Z98.890 (ICD-10-CM) - S/P scar revision THERAPY DIAG:  History of bilateral mastectomy  Aftercare following surgery for neoplasm  At risk for lymphedema  Stiffness of left shoulder, not elsewhere classified  Stiffness of right shoulder, not elsewhere classified  ONSET DATE:  February 2024   Rationale for Evaluation and Treatment: Rehabilitation  SUBJECTIVE:                                                                                                                                                                                           SUBJECTIVE STATEMENT: The exercises are going well. I did have some  increased pain at first but it is better now.   PERTINENT HISTORY: Pt was diagnosed with right breast cancer dx 05/2021. She has BRCA2 genetic mutation and has had left breast cancer in 2006 with chemo/breast conservation and XRT. She then had right breast cancer (dcis) with lumpectomy and radiation only. Dr. Lurene Shadow was her surgeon. She presented this time with a palpable painful mass in the upper outer right breast. Dx imaging showed a 1.2 cm mass at 10 o'clock. 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 The cardiomyopathy recovered, but the neuropathy did not. Her sister and mother have both had breast cancer. Pt underwent bilateral mastectomies with right sentinel node biopsy and expander reconstruction (Pace) 09/04/21. Final path at that time was [id number] with 6 negative nodes and negative margins. Patient ended up having the expanders removed because of continued drainage and discomfort.  2.14.2024 : revision of left mastectomy scar and removal of mass on right lateral mastectomy scar. She was found to have a nodule there and diagnostic imaging was performed. Ultrasound showed a 1.3 cm irregular mass at 9:00. Core needle biopsy was performed and this demonstrated invasive ductal carcinoma, grade 3. This was weakly ER positive, PR negative, HER2 positive, Ki-67 70%. She is currently undergoing chemotherapy   Pt also has a pacemaker  and defibrillator.  She has a decrease ejection fraction of 30% likely exacerbated by the chemotherapy.    Review of Systems:  PAIN:  Are you having pain? No She feels that she has tightness in her neck from stress  She rates the tightness across her  chest at 7/10  PRECAUTIONS: Fall and Other: at risk for lymphedema, cardiac precautions   WEIGHT BEARING RESTRICTIONS: No  FALLS:  Has patient fallen in last 6 months? No  LIVING ENVIRONMENT: Lives with: lives with their  family OCCUPATION: does not work   LEISURE: wants to go to [employer] she can only walk one block before she fatigues   HAND DOMINANCE: right   PRIOR LEVEL OF FUNCTION: Independent though limited by fatigue   PATIENT GOALS: to get things back on the top shelf of her cabinets    OBJECTIVE:  COGNITION: Overall cognitive status: Within functional limits for tasks assessed   PALPATION: Tight limited skin excursion across both sides of her chest with limited scar mobility   OBSERVATIONS / OTHER ASSESSMENTS: pt with visible tightness at chest with contraction of scars and fullness above scars    POSTURE: forward head rounded shoulders   UPPER EXTREMITY AROM/PROM:  A/PROM RIGHT   eval   Shoulder extension 20  Shoulder flexion 110  Shoulder abduction 90  Shoulder internal rotation 20 with pain   Shoulder external rotation 90    (Blank rows = not tested)  A/PROM LEFT   eval  Shoulder extension 20  Shoulder flexion 120  Shoulder abduction 95  Shoulder internal rotation 5 with pain   Shoulder external rotation 90    (Blank rows = not tested)   UPPER EXTREMITY STRENGTH: less that 3/5 as pt is not able to lift arms to end range of her available range of motion, diffuse muscle atrophy visible   L LYMPHEDEMA ASSESSMENTS:   SURGERY TYPE/DATE: multiple surgeries 2006, 2022, 2024  NUMBER OF LYMPH NODES REMOVED: 6  CHEMOTHERAPY: yes, ongoing   RADIATION:yes  HORMONE TREATMENT: yes   INFECTIONS: no  LYMPHEDEMA ASSESSMENTS:   LANDMARK RIGHT  eval  10 cm proximal to olecranon process 35  Olecranon process 28  10 cm proximal to ulnar styloid process 22.7  Just proximal to ulnar styloid process 17.5  Across hand at thumb web space 20  At base of 2nd digit 6.5  (Blank rows = not tested)  LANDMARK LEFT  eval  10 cm proximal to olecranon process 35  Olecranon process 27.5  10 cm proximal to ulnar styloid process 21.5  Just proximal to ulnar styloid process  17.5  Across hand at thumb web space 20  At base of 2nd digit 6.5  (Blank rows = not tested)  FUNCTIONAL TESTS:  6 reps in  30 seconds chair stand test  " pt very slow      QUICK DASH SURVEY: 45.45   TODAY'S TREATMENT:                                                                                                                                         DATE:  04/15/23: In supine PROM to bilateral shoulders in to flexion, abduction and ER  with increased tightness bilaterally and discomfort. Pt guarded throughout. At end of session pt had a spasm in her R pec that eased with STM and stretching. Instructed pt in seated pec stretch and encouraged her to try doorway or corner stretch at home as long as no sharp pain  04/09/2023 evaluation   PATIENT EDUCATION:  Education details: goals of therapy  Person educated: Patient Education method: Explanation Education comprehension: verbalized understanding  HOME EXERCISE PROGRAM: Pt encouraged to do the broomstick exercises and shoulder exercises she has previously been doing  Doorway or corner stretch  ASSESSMENT:  CLINICAL IMPRESSION: Began PROM to bilateral shoulders. Ended session early due to discomfort across bilateral chest and to avoid too much soreness after today's visit. Instructed her in pec stretch to begin doing at home to help decrease tightness.   OBJECTIVE IMPAIRMENTS: cardiopulmonary status limiting activity, decreased activity tolerance, decreased endurance, decreased ROM, decreased strength, increased fascial restrictions, impaired perceived functional ability, impaired UE functional use, and postural dysfunction.   ACTIVITY LIMITATIONS: carrying, lifting, standing, reach over head, and locomotion level  PARTICIPATION LIMITATIONS: meal prep, cleaning, laundry, and yard work  PERSONAL FACTORS: Age, Past/current experiences, and 3+ comorbidities: previous surgery, decreased cardiac status, onling chemotherapy   are also  affecting patient's functional outcome.   REHAB POTENTIAL: Good  CLINICAL DECISION MAKING: Stable/uncomplicated  EVALUATION COMPLEXITY: Low  GOALS: Goals reviewed with patient? Yes  SHORT TERM GOALS: Target date: 05/09/2023  Pt will be independent in a basic exercise program for shoulder ROM and Strenth  Baseline:needs review Goal status: INITIAL  LONG TERM GOALS: Target date: 8/18.2024  Pt will decrease quick DASH score to < 20 indicating an improvement in functional use of her UE's Baseline: 45.45 Goal status: INITIAL  2.  Pt will have increased shoulder flextion of >  135 degrees in both shoulders so that she can use her arms better at home  Baseline: 110 on right and 120 on left  Goal status: INITIAL  3.  Pt will report that she can put things on the top shelf of her cabinets at home without using a stepstool  Baseline: unable  Goal status: INITIAL  4.  Pt will increase to 10 reps of sit to stand in 30 seconds indicating an improvement in activity tolerance and functional strength  Baseline: 6 Goal status: INITIAL  PLAN:  PT FREQUENCY: 1-2x/week  PT DURATION: 8 weeks  PLANNED INTERVENTIONS: Therapeutic exercises, Therapeutic activity, Neuromuscular re-education, Balance training, Gait training, Patient/Family education, Self Care, Joint mobilization, scar mobilization, and Manual therapy  PLAN FOR NEXT SESSION: maual work for soft tissue and scar mobilization, passive and active excise for shoulders eventually add nustep and functional endurance exercise   Leonette Most, PT 04/15/2023, 2:52 PM   [facility] [address] [place], [place], [address] Phone: [phone]   Fax:  [phone]

## 2023-04-17 ENCOUNTER — Telehealth: Payer: Self-pay

## 2023-04-17 NOTE — Telephone Encounter (Signed)
Called pt per MD to advise [employer] testing was negative/not detected. Pt verbalized understanding of results and knows [employer] will be in touch to schedule 3 mo repeat lab.   

## 2023-04-18 ENCOUNTER — Ambulatory Visit: Payer: 59

## 2023-04-18 DIAGNOSIS — Z9189 Other specified personal risk factors, not elsewhere classified: Secondary | ICD-10-CM | POA: Diagnosis not present

## 2023-04-18 DIAGNOSIS — Z483 Aftercare following surgery for neoplasm: Secondary | ICD-10-CM

## 2023-04-18 DIAGNOSIS — Z9889 Other specified postprocedural states: Secondary | ICD-10-CM | POA: Diagnosis not present

## 2023-04-18 DIAGNOSIS — M25612 Stiffness of left shoulder, not elsewhere classified: Secondary | ICD-10-CM

## 2023-04-18 DIAGNOSIS — M25611 Stiffness of right shoulder, not elsewhere classified: Secondary | ICD-10-CM

## 2023-04-18 DIAGNOSIS — Z9013 Acquired absence of bilateral breasts and nipples: Secondary | ICD-10-CM

## 2023-04-18 NOTE — Therapy (Signed)
[facility] [address] [place], [place], [address] Phone: [phone]   Fax:  [phone]  Patient Details  Name: Jordan Willis MRN: [id number] Date of Birth: [birth year] Referring Provider:  Santiago Glad, MD  Encounter Date: 04/18/2023   OUTPATIENT PHYSICAL THERAPY ONCOLOGY TREATMENT  Patient Name: Jordan Willis MRN: [id number] DOB:1951-09-04, 72 y.o., female Today's Date: 04/18/2023  END OF SESSION:  PT End of Session - 04/18/23 0809     Visit Number 3    Number of Visits 17    Date for PT Re-Evaluation 06/10/23    PT Start Time 0807    PT Stop Time 0904    PT Time Calculation (min) 57 min    Activity Tolerance Patient tolerated treatment well;Patient limited by pain    Behavior During Therapy [facility] for tasks assessed/performed              Past Medical History:  Diagnosis Date   AICD (automatic cardioverter/defibrillator) present    Anemia    Anginal pain (HCC)    Anxiety    Arthritis    Lumbar spine DDD.   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   Diabetes mellitus without complication (HCC)    Type II   Dyspnea    When patient gets sick uses Pro-Air inhaler   Frequent sinus infections    GERD (gastroesophageal reflux disease)    occ   Goiter    History of cardiomegaly    History of cataract    Bilateral   History of kidney stones    08/28/2018 currently has a large kidney stone, asymptomatic at this time   History of migraine    History of uterine fibroid    History of uterine prolapse    Hyperlipidemia    Hypertension    LBBB (left bundle branch block)    Menopause 03/03/2012   Nasal turbinate hypertrophy 11/29/2016   Left inferior    Personal history of chemotherapy    Personal history of radiation therapy    Pneumonia     as a child   PONV (postoperative nausea and vomiting)    Sinusitis 2014   Sleep apnea    No CPAP   SUI (stress urinary incontinence, female)    SVD (spontaneous vaginal delivery)    x 2   Thyroid nodule    Past Surgical History:  Procedure Laterality Date   ABDOMINAL HYSTERECTOMY     BILATERAL SALPINGOOPHORECTOMY Bilateral    BIV ICD INSERTION CRT-D N/A 08/24/2022   Procedure: BIV ICD INSERTION CRT-D;  Surgeon: Marinus Maw, MD;  Location: [facility] INVASIVE CV LAB;  Service: Cardiovascular;  Laterality: N/A;   BLADDER SUSPENSION N/A 05/16/2017   Procedure: TRANSVAGINAL TAPE (TVT) PROCEDURE;  Surgeon: Osborn Coho, MD;  Location: [facility] ORS;  Service: Gynecology;  Laterality: N/A;   BREAST BIOPSY Right 2009   BREAST IMPLANT REMOVAL Bilateral 02/05/2022   Procedure: Removal bilateral breast implants;  Surgeon: Allena Napoleon, MD;  Location: [facility] OR;  Service: Plastics;  Laterality: Bilateral;   BREAST LUMPECTOMY Right 2009   BREAST LUMPECTOMY Left 2006   BREAST RECONSTRUCTION WITH PLACEMENT OF TISSUE EXPANDER AND FLEX HD (ACELLULAR HYDRATED DERMIS) Bilateral 09/04/2021   Procedure: BILATERAL BREAST RECONSTRUCTION WITH PLACEMENT OF TISSUE EXPANDER AND FLEX HD (  ACELLULAR HYDRATED DERMIS);  Surgeon: Allena Napoleon, MD;  Location: [facility] OR;  Service: Plastics;  Laterality: Bilateral;   CARDIAC CATHETERIZATION     CATARACT EXTRACTION Left 10/23/2017   CATARACT EXTRACTION Right 11/06/2017   COLONOSCOPY  10/22/2009   Normal.  Repeat 5 years.  [name]   COLONOSCOPY WITH PROPOFOL N/A 12/21/2022   Procedure: COLONOSCOPY WITH PROPOFOL;  Surgeon: Jeani Hawking, MD;  Location: [facility] ENDOSCOPY;  Service: Gastroenterology;  Laterality: N/A;   CYSTOSCOPY N/A 05/16/2017   Procedure: CYSTOSCOPY;  Surgeon: Osborn Coho, MD;  Location: [facility] ORS;  Service: Gynecology;  Laterality: N/A;   DEBRIDEMENT AND CLOSURE WOUND Bilateral 10/03/2021   Procedure: Debridement bilateral mastectomy flaps;  Surgeon: Allena Napoleon, MD;  Location: [facility] OR;  Service: Plastics;  Laterality: Bilateral;  1.5 hour   EYE SURGERY     bilateral cataracts   LEFT HEART CATH AND CORONARY ANGIOGRAPHY N/A 02/09/2020   Procedure: LEFT HEART CATH AND CORONARY ANGIOGRAPHY;  Surgeon: Elder Negus, MD;  Location: [facility] INVASIVE CV LAB;  Service: Cardiovascular;  Laterality: N/A;   LEFT HEART CATH AND CORONARY ANGIOGRAPHY N/A 03/27/2022   Procedure: LEFT HEART CATH AND CORONARY ANGIOGRAPHY;  Surgeon: Yates Decamp, MD;  Location: [facility] INVASIVE CV LAB;  Service: Cardiovascular;  Laterality: N/A;   MASS EXCISION Right 12/04/2022   Procedure: EXCISION OF RIGHT CHEST WALL MASS;  Surgeon: Almond Lint, MD;  Location: [facility] OR;  Service: General;  Laterality: Right;   MASTECTOMY W/ SENTINEL NODE BIOPSY Right 09/04/2021   Procedure: BILATERAL MASTECTOMIES WITH RIGHT SENTINEL LYMPH NODE BIOPSY;  Surgeon: Almond Lint, MD;  Location: [facility] OR;  Service: General;  Laterality: Right;   REMOVAL OF BILATERAL TISSUE EXPANDERS WITH PLACEMENT OF BILATERAL BREAST IMPLANTS Bilateral 10/03/2021   Procedure: REMOVAL OF BILATERAL TISSUE EXPANDERS WITH REPLACEMENT OF TISSUE EXPANDERS;  Surgeon: Allena Napoleon, MD;  Location: [facility] OR;  Service: Plastics;  Laterality: Bilateral;   REMOVAL OF BILATERAL TISSUE EXPANDERS WITH PLACEMENT OF BILATERAL BREAST IMPLANTS Bilateral 12/19/2021   Procedure: REMOVAL OF BILATERAL TISSUE EXPANDERS WITH PLACEMENT OF BILATERAL BREAST IMPLANTS;  Surgeon: Allena Napoleon, MD;  Location: [facility] OR;  Service: Plastics;  Laterality: Bilateral;   ROBOTIC ASSISTED TOTAL HYSTERECTOMY N/A 05/16/2017   Procedure: ROBOTIC ASSISTED TOTAL HYSTERECTOMY;  Surgeon: Silverio Lay, MD;  Location: [facility] ORS;  Service: Gynecology;  Laterality: N/A;   ROTATOR CUFF REPAIR Bilateral    SCAR REVISION N/A 12/04/2022   Procedure: REVISION OF MASTECTOMY SCAR;  Surgeon: Almond Lint, MD;  Location: [facility] OR;  Service: General;  Laterality: N/A;   SHOULDER ARTHROSCOPY  WITH ROTATOR CUFF REPAIR AND SUBACROMIAL DECOMPRESSION Right 09/03/2018   Procedure: Right shoulder manipulation under anesthesia, exam under anesthesia, mini open rotator cuff repair, subacromial decompression;  Surgeon: Jene Every, MD;  Location: [facility] ORS;  Service: Orthopedics;  Laterality: Right;  90 mins   TUBAL LIGATION     WISDOM TOOTH EXTRACTION     Patient Active Problem List   Diagnosis Date Noted   ICD (implantable cardioverter-defibrillator) in place 12/25/2022   Bradycardia 08/24/2022   BRCA2 gene mutation positive in female 07/30/2022   LBBB (left bundle branch block)    Chronic diastolic heart failure (HCC)    NICM (nonischemic cardiomyopathy) (HCC)    Breast cancer of upper-outer quadrant of right female breast (HCC) 09/04/2021   Diabetic neuropathy (HCC) 02/08/2021   Angina pectoris (HCC) 02/09/2020   Abnormal stress test 02/09/2020   Complete rotator cuff tear 09/03/2018   Pelvic  prolapse 05/16/2017   Intractable chronic post-traumatic headache 11/19/2016   Malignant neoplasm of upper-outer quadrant of left breast in female, estrogen receptor negative (HCC) 03/03/2012   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   Menopause 03/03/2012    PCP: Merri Brunette, MD  REFERRING PROVIDER: Santiago Glad, MD  REFERRING DIAG: breast cancer, stiffness in shoulders  Z98.890 (ICD-10-CM) - S/P scar revision THERAPY DIAG:  History of bilateral mastectomy  Aftercare following surgery for neoplasm  At risk for lymphedema  Stiffness of left shoulder, not elsewhere classified  Stiffness of right shoulder, not elsewhere classified  ONSET DATE:  February 2024   Rationale for Evaluation and Treatment: Rehabilitation  SUBJECTIVE:                                                                                                                                                                                           SUBJECTIVE STATEMENT: I was sore after  last session but not unreasonably so. I've been doing the HEP stretches at home. I back off when it starts to hurt or pull when I'm doing the stretches at home.   PERTINENT HISTORY: Pt was diagnosed with right breast cancer dx 05/2021. She has BRCA2 genetic mutation and has had left breast cancer in 2006 with chemo/breast conservation and XRT. She then had right breast cancer (dcis) with lumpectomy and radiation only. Dr. Lurene Shadow was her surgeon. She presented this time with a palpable painful mass in the upper outer right breast. Dx imaging showed a 1.2 cm mass at 10 o'clock. 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 The cardiomyopathy recovered, but the neuropathy did not. Her sister and mother have both had breast cancer. Pt underwent bilateral mastectomies with right sentinel node biopsy and expander reconstruction (Pace) 09/04/21. Final path at that time was [id number] with 6 negative nodes and negative margins. Patient ended up having the expanders removed because of continued drainage and discomfort.  2.14.2024 : revision of left mastectomy scar and removal of mass on right lateral mastectomy scar. She was found to have a nodule there and diagnostic imaging was performed. Ultrasound showed a 1.3 cm irregular mass at 9:00. Core needle biopsy was performed and this demonstrated invasive ductal carcinoma, grade 3. This was weakly ER positive, PR negative, HER2 positive, Ki-67 70%. She is currently undergoing chemotherapy   Pt also has a pacemaker  and defibrillator.  She has a decrease ejection fraction of 30% likely exacerbated by the chemotherapy.    Review of Systems:  PAIN:  Are you  having pain? No She feels that she has tightness in her neck from stress  She rates the tightness across her chest to shoulders at 5-6/10   PRECAUTIONS: Fall and Other: at risk for lymphedema, cardiac precautions    WEIGHT BEARING RESTRICTIONS: No  FALLS:  Has patient fallen in last 6 months? No  LIVING ENVIRONMENT: Lives with: lives with their family OCCUPATION: does not work   LEISURE: wants to go to [employer] she can only walk one block before she fatigues   HAND DOMINANCE: right   PRIOR LEVEL OF FUNCTION: Independent though limited by fatigue   PATIENT GOALS: to get things back on the top shelf of her cabinets    OBJECTIVE:  COGNITION: Overall cognitive status: Within functional limits for tasks assessed   PALPATION: Tight limited skin excursion across both sides of her chest with limited scar mobility   OBSERVATIONS / OTHER ASSESSMENTS: pt with visible tightness at chest with contraction of scars and fullness above scars    POSTURE: forward head rounded shoulders   UPPER EXTREMITY AROM/PROM:  A/PROM RIGHT   eval   Shoulder extension 20  Shoulder flexion 110  Shoulder abduction 90  Shoulder internal rotation 20 with pain   Shoulder external rotation 90    (Blank rows = not tested)  A/PROM LEFT   eval  Shoulder extension 20  Shoulder flexion 120  Shoulder abduction 95  Shoulder internal rotation 5 with pain   Shoulder external rotation 90    (Blank rows = not tested)   UPPER EXTREMITY STRENGTH: less that 3/5 as pt is not able to lift arms to end range of her available range of motion, diffuse muscle atrophy visible   L LYMPHEDEMA ASSESSMENTS:   SURGERY TYPE/DATE: multiple surgeries 2006, 2022, 2024  NUMBER OF LYMPH NODES REMOVED: 6  CHEMOTHERAPY: yes, ongoing   RADIATION:yes  HORMONE TREATMENT: yes   INFECTIONS: no  LYMPHEDEMA ASSESSMENTS:   LANDMARK RIGHT  eval  10 cm proximal to olecranon process 35  Olecranon process 28  10 cm proximal to ulnar styloid process 22.7  Just proximal to ulnar styloid process 17.5  Across hand at thumb web space 20  At base of 2nd digit 6.5  (Blank rows = not tested)  LANDMARK LEFT  eval  10 cm proximal  to olecranon process 35  Olecranon process 27.5  10 cm proximal to ulnar styloid process 21.5  Just proximal to ulnar styloid process 17.5  Across hand at thumb web space 20  At base of 2nd digit 6.5  (Blank rows = not tested)  FUNCTIONAL TESTS:  6 reps in  30 seconds chair stand test  " pt very slow      QUICK DASH SURVEY: 45.45   TODAY'S TREATMENT:  DATE:  04/18/23: Manual Therapy P/ROM in supine to bil shoulders into flex, scaption and D2 with scapular depression throughout STM to bil lateral chest wall/trunk over palpably tight musculature Therapeutic Exercises Pulleys into flex and scaption x 2 mins each returning therapist demo and VC's during to decr scapular compensation Lt UE NTS on wall as pt reports this UE tingles when she tries to lay on it at night; encouraged to do this only about 5 reps at a time and to not overstretch the nerve.  Roll yellow ball on wall for flexion x 10 returning therapist demo and VC's with demo to decr scap compensation throughout. Pt able to do this with cuing   04/15/23: In supine PROM to bilateral shoulders in to flexion, abduction and ER with increased tightness bilaterally and discomfort. Pt guarded throughout. At end of session pt had a spasm in her R pec that eased with STM and stretching. Instructed pt in seated pec stretch and encouraged her to try doorway or corner stretch at home as long as no sharp pain  04/09/2023 evaluation   PATIENT EDUCATION:  Education details: goals of therapy  Person educated: Patient Education method: Explanation Education comprehension: verbalized understanding  HOME EXERCISE PROGRAM: Pt encouraged to do the broomstick exercises and shoulder exercises she has previously been doing  Doorway or corner stretch  ASSESSMENT:  CLINICAL IMPRESSION: Pt tolerated stretching  much better today and with no muscle spasms. She did require some cuing during session to relax during P/ROM but mildly so. Also was able to begin AA/ROM exs with pulleys and ball roll up wall which she did well with corrections with cuing. Encouraged pt that when she is stretching at home it's ok to stretch into mod stretch but not into pain, pt verbalized understanding.   OBJECTIVE IMPAIRMENTS: cardiopulmonary status limiting activity, decreased activity tolerance, decreased endurance, decreased ROM, decreased strength, increased fascial restrictions, impaired perceived functional ability, impaired UE functional use, and postural dysfunction.   ACTIVITY LIMITATIONS: carrying, lifting, standing, reach over head, and locomotion level  PARTICIPATION LIMITATIONS: meal prep, cleaning, laundry, and yard work  PERSONAL FACTORS: Age, Past/current experiences, and 3+ comorbidities: previous surgery, decreased cardiac status, onling chemotherapy   are also affecting patient's functional outcome.   REHAB POTENTIAL: Good  CLINICAL DECISION MAKING: Stable/uncomplicated  EVALUATION COMPLEXITY: Low  GOALS: Goals reviewed with patient? Yes  SHORT TERM GOALS: Target date: 05/09/2023  Pt will be independent in a basic exercise program for shoulder ROM and Strenth  Baseline:needs review Goal status: INITIAL  LONG TERM GOALS: Target date: 8/18.2024  Pt will decrease quick DASH score to < 20 indicating an improvement in functional use of her UE's Baseline: 45.45 Goal status: INITIAL  2.  Pt will have increased shoulder flextion of >  135 degrees in both shoulders so that she can use her arms better at home  Baseline: 110 on right and 120 on left  Goal status: INITIAL  3.  Pt will report that she can put things on the top shelf of her cabinets at home without using a stepstool  Baseline: unable  Goal status: INITIAL  4.  Pt will increase to 10 reps of sit to stand in 30 seconds indicating an  improvement in activity tolerance and functional strength  Baseline: 6 Goal status: INITIAL  PLAN:  PT FREQUENCY: 1-2x/week  PT DURATION: 8 weeks  PLANNED INTERVENTIONS: Therapeutic exercises, Therapeutic activity, Neuromuscular re-education, Balance training, Gait training, Patient/Family education, Self Care, Joint mobilization, scar mobilization, and  Manual therapy  PLAN FOR NEXT SESSION: Cont manual work for soft tissue and scar mobilization, passive and A/AA/ROM excise for shoulders; eventually add nustep and functional endurance exercise   Hermenia Bers, PTA 04/18/2023, 9:23 AM   [facility] [address] [place], [place], [address] Phone: [phone]   Fax:  [phone]

## 2023-04-23 ENCOUNTER — Ambulatory Visit: Payer: 59

## 2023-04-29 ENCOUNTER — Ambulatory Visit: Payer: 59 | Attending: Plastic Surgery

## 2023-04-29 DIAGNOSIS — M25611 Stiffness of right shoulder, not elsewhere classified: Secondary | ICD-10-CM

## 2023-04-29 DIAGNOSIS — C50412 Malignant neoplasm of upper-outer quadrant of left female breast: Secondary | ICD-10-CM | POA: Diagnosis not present

## 2023-04-29 DIAGNOSIS — Z9189 Other specified personal risk factors, not elsewhere classified: Secondary | ICD-10-CM

## 2023-04-29 DIAGNOSIS — Z9013 Acquired absence of bilateral breasts and nipples: Secondary | ICD-10-CM | POA: Diagnosis not present

## 2023-04-29 DIAGNOSIS — Z170 Estrogen receptor positive status [ER+]: Secondary | ICD-10-CM | POA: Diagnosis not present

## 2023-04-29 DIAGNOSIS — M25612 Stiffness of left shoulder, not elsewhere classified: Secondary | ICD-10-CM

## 2023-04-29 DIAGNOSIS — Z483 Aftercare following surgery for neoplasm: Secondary | ICD-10-CM | POA: Diagnosis not present

## 2023-04-29 DIAGNOSIS — C50911 Malignant neoplasm of unspecified site of right female breast: Secondary | ICD-10-CM | POA: Diagnosis not present

## 2023-04-29 DIAGNOSIS — C50411 Malignant neoplasm of upper-outer quadrant of right female breast: Secondary | ICD-10-CM | POA: Diagnosis not present

## 2023-04-29 DIAGNOSIS — Z171 Estrogen receptor negative status [ER-]: Secondary | ICD-10-CM | POA: Diagnosis not present

## 2023-04-29 NOTE — Therapy (Signed)
Patient Details  Name: Jordan Willis MRN: [id number] Date of Birth: 07/19/1951 Referring Provider:  Santiago Glad, MD  Encounter Date: 04/29/2023   OUTPATIENT PHYSICAL THERAPY ONCOLOGY TREATMENT  Patient Name: Jordan Willis MRN: [id number] DOB:1951-03-25, 72 y.o., female Today's Date: 04/29/2023  END OF SESSION:  PT End of Session - 04/29/23 1410     Visit Number 4    Number of Visits 17    Date for PT Re-Evaluation 06/10/23    PT Start Time 1408    PT Stop Time 1501    PT Time Calculation (min) 53 min    Activity Tolerance Patient tolerated treatment well    Behavior During Therapy WFL for tasks assessed/performed              Past Medical History:  Diagnosis Date   AICD (automatic cardioverter/defibrillator) present    Anemia    Anginal pain (HCC)    Anxiety    Arthritis    Lumbar spine DDD.   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   Diabetes mellitus without complication (HCC)    Type II   Dyspnea    When patient gets sick uses Pro-Air inhaler   Frequent sinus infections    GERD (gastroesophageal reflux disease)    occ   Goiter    History of cardiomegaly    History of cataract    Bilateral   History of kidney stones    08/28/2018 currently has a large kidney stone, asymptomatic at this time   History of migraine    History of uterine fibroid    History of uterine prolapse    Hyperlipidemia    Hypertension    LBBB (left bundle branch block)    Menopause 03/03/2012   Nasal turbinate hypertrophy 11/29/2016   Left inferior    Personal history of chemotherapy    Personal history of radiation therapy    Pneumonia    as a child   PONV (postoperative nausea and vomiting)    Sinusitis 2014   Sleep apnea    No CPAP   SUI (stress urinary incontinence, female)    SVD  (spontaneous vaginal delivery)    x 2   Thyroid nodule    Past Surgical History:  Procedure Laterality Date   ABDOMINAL HYSTERECTOMY     BILATERAL SALPINGOOPHORECTOMY Bilateral    BIV ICD INSERTION CRT-D N/A 08/24/2022   Procedure: BIV ICD INSERTION CRT-D;  Surgeon: Marinus Maw, MD;  Location: [facility] INVASIVE CV LAB;  Service: Cardiovascular;  Laterality: N/A;   BLADDER SUSPENSION N/A 05/16/2017   Procedure: TRANSVAGINAL TAPE (TVT) PROCEDURE;  Surgeon: Osborn Coho, MD;  Location: [facility] ORS;  Service: Gynecology;  Laterality: N/A;   BREAST BIOPSY Right 2009   BREAST IMPLANT REMOVAL Bilateral 02/05/2022   Procedure: Removal bilateral breast implants;  Surgeon: Allena Napoleon, MD;  Location: [facility] OR;  Service: Plastics;  Laterality: Bilateral;   BREAST LUMPECTOMY Right 2009   BREAST LUMPECTOMY Left 2006   BREAST RECONSTRUCTION WITH PLACEMENT OF TISSUE EXPANDER AND FLEX HD (ACELLULAR HYDRATED DERMIS) Bilateral 09/04/2021   Procedure: BILATERAL BREAST RECONSTRUCTION WITH PLACEMENT OF TISSUE EXPANDER AND FLEX HD (ACELLULAR HYDRATED DERMIS);  Surgeon: Allena Napoleon, MD;  Location: [facility] OR;  Service: Plastics;  Laterality: Bilateral;   CARDIAC CATHETERIZATION  CATARACT EXTRACTION Left 10/23/2017   CATARACT EXTRACTION Right 11/06/2017   COLONOSCOPY  10/22/2009   Normal.  Repeat 5 years.  [name]   COLONOSCOPY WITH PROPOFOL N/A 12/21/2022   Procedure: COLONOSCOPY WITH PROPOFOL;  Surgeon: Jeani Hawking, MD;  Location: [facility];  Service: Gastroenterology;  Laterality: N/A;   CYSTOSCOPY N/A 05/16/2017   Procedure: CYSTOSCOPY;  Surgeon: Osborn Coho, MD;  Location: [facility] ORS;  Service: Gynecology;  Laterality: N/A;   DEBRIDEMENT AND CLOSURE WOUND Bilateral 10/03/2021   Procedure: Debridement bilateral mastectomy flaps;  Surgeon: Allena Napoleon, MD;  Location: [facility] OR;  Service: Plastics;  Laterality: Bilateral;  1.5 hour   EYE SURGERY     bilateral cataracts   LEFT HEART CATH AND CORONARY  ANGIOGRAPHY N/A 02/09/2020   Procedure: LEFT HEART CATH AND CORONARY ANGIOGRAPHY;  Surgeon: Elder Negus, MD;  Location: [facility] INVASIVE CV LAB;  Service: Cardiovascular;  Laterality: N/A;   LEFT HEART CATH AND CORONARY ANGIOGRAPHY N/A 03/27/2022   Procedure: LEFT HEART CATH AND CORONARY ANGIOGRAPHY;  Surgeon: Yates Decamp, MD;  Location: [facility] INVASIVE CV LAB;  Service: Cardiovascular;  Laterality: N/A;   MASS EXCISION Right 12/04/2022   Procedure: EXCISION OF RIGHT CHEST WALL MASS;  Surgeon: Almond Lint, MD;  Location: [facility] OR;  Service: General;  Laterality: Right;   MASTECTOMY W/ SENTINEL NODE BIOPSY Right 09/04/2021   Procedure: BILATERAL MASTECTOMIES WITH RIGHT SENTINEL LYMPH NODE BIOPSY;  Surgeon: Almond Lint, MD;  Location: [facility] OR;  Service: General;  Laterality: Right;   REMOVAL OF BILATERAL TISSUE EXPANDERS WITH PLACEMENT OF BILATERAL BREAST IMPLANTS Bilateral 10/03/2021   Procedure: REMOVAL OF BILATERAL TISSUE EXPANDERS WITH REPLACEMENT OF TISSUE EXPANDERS;  Surgeon: Allena Napoleon, MD;  Location: [facility] OR;  Service: Plastics;  Laterality: Bilateral;   REMOVAL OF BILATERAL TISSUE EXPANDERS WITH PLACEMENT OF BILATERAL BREAST IMPLANTS Bilateral 12/19/2021   Procedure: REMOVAL OF BILATERAL TISSUE EXPANDERS WITH PLACEMENT OF BILATERAL BREAST IMPLANTS;  Surgeon: Allena Napoleon, MD;  Location: [facility] OR;  Service: Plastics;  Laterality: Bilateral;   ROBOTIC ASSISTED TOTAL HYSTERECTOMY N/A 05/16/2017   Procedure: ROBOTIC ASSISTED TOTAL HYSTERECTOMY;  Surgeon: Silverio Lay, MD;  Location: [facility] ORS;  Service: Gynecology;  Laterality: N/A;   ROTATOR CUFF REPAIR Bilateral    SCAR REVISION N/A 12/04/2022   Procedure: REVISION OF MASTECTOMY SCAR;  Surgeon: Almond Lint, MD;  Location: [facility] OR;  Service: General;  Laterality: N/A;   SHOULDER ARTHROSCOPY WITH ROTATOR CUFF REPAIR AND SUBACROMIAL DECOMPRESSION Right 09/03/2018   Procedure: Right shoulder manipulation under anesthesia, exam under anesthesia, mini  open rotator cuff repair, subacromial decompression;  Surgeon: Jene Every, MD;  Location: [facility] ORS;  Service: Orthopedics;  Laterality: Right;  90 mins   TUBAL LIGATION     WISDOM TOOTH EXTRACTION     Patient Active Problem List   Diagnosis Date Noted   ICD (implantable cardioverter-defibrillator) in place 12/25/2022   Bradycardia 08/24/2022   BRCA2 gene mutation positive in female 07/30/2022   LBBB (left bundle branch block)    Chronic diastolic heart failure (HCC)    NICM (nonischemic cardiomyopathy) (HCC)    Breast cancer of upper-outer quadrant of right female breast (HCC) 09/04/2021   Diabetic neuropathy (HCC) 02/08/2021   Angina pectoris (HCC) 02/09/2020   Abnormal stress test 02/09/2020   Complete rotator cuff tear 09/03/2018   Pelvic prolapse 05/16/2017   Intractable chronic post-traumatic headache 11/19/2016   Malignant neoplasm of upper-outer quadrant of left breast in female, estrogen receptor negative (HCC) 03/03/2012  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   Menopause 03/03/2012    PCP: Merri Brunette, MD  REFERRING PROVIDER: Santiago Glad, MD  REFERRING DIAG: breast cancer, stiffness in shoulders  Z98.890 (ICD-10-CM) - S/P scar revision THERAPY DIAG:  History of bilateral mastectomy  Aftercare following surgery for neoplasm  At risk for lymphedema  Stiffness of left shoulder, not elsewhere classified  Stiffness of right shoulder, not elsewhere classified  ONSET DATE:  February 2024   Rationale for Evaluation and Treatment: Rehabilitation  SUBJECTIVE:                                                                                                                                                                                           SUBJECTIVE STATEMENT: I went to visit my sister in [place] and we had a good time. I walked a lot and had to sit a lot but my energy was a lot better than it used to be so I was glad for that.   PERTINENT  HISTORY: Pt was diagnosed with right breast cancer dx 05/2021. She has BRCA2 genetic mutation and has had left breast cancer in 2006 with chemo/breast conservation and XRT. She then had right breast cancer (dcis) with lumpectomy and radiation only. Dr. Lurene Shadow was her surgeon. She presented this time with a palpable painful mass in the upper outer right breast. Dx imaging showed a 1.2 cm mass at 10 o'clock. 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 The cardiomyopathy recovered, but the neuropathy did not. Her sister and mother have both had breast cancer. Pt underwent bilateral mastectomies with right sentinel node biopsy and expander reconstruction (Pace) 09/04/21. Final path at that time was [id number] with 6 negative nodes and negative margins. Patient ended up having the expanders removed because of continued drainage and discomfort.  2.14.2024 : revision of left mastectomy scar and removal of mass on right lateral mastectomy scar. She was found to have a nodule there and diagnostic imaging was performed. Ultrasound showed a 1.3 cm irregular mass at 9:00. Core needle biopsy was performed and this demonstrated invasive ductal carcinoma, grade 3. This was weakly ER positive, PR negative, HER2 positive, Ki-67 70%. She is currently undergoing chemotherapy   Pt also has a pacemaker  and defibrillator.  She has a decrease ejection fraction of 30% likely exacerbated by the chemotherapy.    Review of Systems:  PAIN:  Are you having pain? No She feels that she has tightness in her neck from stress  She rates the tightness across her  chest to shoulders at 5-6/10   PRECAUTIONS: Fall and Other: at risk for lymphedema, cardiac precautions   WEIGHT BEARING RESTRICTIONS: No  FALLS:  Has patient fallen in last 6 months? No  LIVING ENVIRONMENT: Lives with: lives with their family OCCUPATION: does not work    LEISURE: wants to go to [employer] she can only walk one block before she fatigues   HAND DOMINANCE: right   PRIOR LEVEL OF FUNCTION: Independent though limited by fatigue   PATIENT GOALS: to get things back on the top shelf of her cabinets    OBJECTIVE:  COGNITION: Overall cognitive status: Within functional limits for tasks assessed   PALPATION: Tight limited skin excursion across both sides of her chest with limited scar mobility   OBSERVATIONS / OTHER ASSESSMENTS: pt with visible tightness at chest with contraction of scars and fullness above scars    POSTURE: forward head rounded shoulders   UPPER EXTREMITY AROM/PROM:  A/PROM RIGHT   eval   Shoulder extension 20  Shoulder flexion 110  Shoulder abduction 90  Shoulder internal rotation 20 with pain   Shoulder external rotation 90    (Blank rows = not tested)  A/PROM LEFT   eval  Shoulder extension 20  Shoulder flexion 120  Shoulder abduction 95  Shoulder internal rotation 5 with pain   Shoulder external rotation 90    (Blank rows = not tested)   UPPER EXTREMITY STRENGTH: less that 3/5 as pt is not able to lift arms to end range of her available range of motion, diffuse muscle atrophy visible   LYMPHEDEMA ASSESSMENTS:   SURGERY TYPE/DATE: multiple surgeries 2006, 2022, 2024  NUMBER OF LYMPH NODES REMOVED: 6  CHEMOTHERAPY: yes, ongoing   RADIATION:yes  HORMONE TREATMENT: yes   INFECTIONS: no  LYMPHEDEMA ASSESSMENTS:   LANDMARK RIGHT  eval  10 cm proximal to olecranon process 35  Olecranon process 28  10 cm proximal to ulnar styloid process 22.7  Just proximal to ulnar styloid process 17.5  Across hand at thumb web space 20  At base of 2nd digit 6.5  (Blank rows = not tested)  LANDMARK LEFT  eval  10 cm proximal to olecranon process 35  Olecranon process 27.5  10 cm proximal to ulnar styloid process 21.5  Just proximal to ulnar styloid process 17.5  Across hand at thumb web space 20   At base of 2nd digit 6.5  (Blank rows = not tested)  FUNCTIONAL TESTS:  6 reps in  30 seconds chair stand test  " pt very slow      QUICK DASH SURVEY: 45.45   TODAY'S TREATMENT:                                                                                                                                         DATE:  04/29/23: Therapeutic Exercises Pulleys into flex and scaption x 2 mins  each Roll yellow ball up wall into flexion x 10 and bil abd x 5 each returning therapist demo Supine over half foam for following: Bil UE horz abd, bil UE scaption into a "V" x 10 each; and then bil abd "snow angel" x 5, 5 sec holds  Manual Therapy P/ROM in supine to bil shoulders into flex, scaption and D2 with scapular depression throughout STM to bil lateral chest wall/trunk over palpably tight musculature being mindful of pacemaker on Rt chest  04/18/23: Manual Therapy P/ROM in supine to bil shoulders into flex, scaption and D2 with scapular depression throughout STM to bil lateral chest wall/trunk over palpably tight musculature Therapeutic Exercises Pulleys into flex and scaption x 2 mins each returning therapist demo and VC's during to decr scapular compensation Lt UE NTS on wall as pt reports this UE tingles when she tries to lay on it at night; encouraged to do this only about 5 reps at a time and to not overstretch the nerve.  Roll yellow ball on wall for flexion x 10 returning therapist demo and VC's with demo to decr scap compensation throughout. Pt able to do this with cuing   04/15/23: In supine PROM to bilateral shoulders in to flexion, abduction and ER with increased tightness bilaterally and discomfort. Pt guarded throughout. At end of session pt had a spasm in her R pec that eased with STM and stretching. Instructed pt in seated pec stretch and encouraged her to try doorway or corner stretch at home as long as no sharp pain  04/09/2023 evaluation   PATIENT EDUCATION:  Education  details: goals of therapy  Person educated: Patient Education method: Explanation Education comprehension: verbalized understanding  HOME EXERCISE PROGRAM: Pt encouraged to do the broomstick exercises and shoulder exercises she has previously been doing  Doorway or corner stretch  ASSESSMENT:  CLINICAL IMPRESSION: Continued with AA/ROM exs and pt shows good improvement with being more aware of scapular compensation and self correcting. Then continued with manua l therapy working to decrease bil upper quadrant tightness and improve end P/ROM.   OBJECTIVE IMPAIRMENTS: cardiopulmonary status limiting activity, decreased activity tolerance, decreased endurance, decreased ROM, decreased strength, increased fascial restrictions, impaired perceived functional ability, impaired UE functional use, and postural dysfunction.   ACTIVITY LIMITATIONS: carrying, lifting, standing, reach over head, and locomotion level  PARTICIPATION LIMITATIONS: meal prep, cleaning, laundry, and yard work  PERSONAL FACTORS: Age, Past/current experiences, and 3+ comorbidities: previous surgery, decreased cardiac status, onling chemotherapy   are also affecting patient's functional outcome.   REHAB POTENTIAL: Good  CLINICAL DECISION MAKING: Stable/uncomplicated  EVALUATION COMPLEXITY: Low  GOALS: Goals reviewed with patient? Yes  SHORT TERM GOALS: Target date: 05/09/2023  Pt will be independent in a basic exercise program for shoulder ROM and Strenth  Baseline:needs review Goal status: INITIAL  LONG TERM GOALS: Target date: 8/18.2024  Pt will decrease quick DASH score to < 20 indicating an improvement in functional use of her UE's Baseline: 45.45 Goal status: INITIAL  2.  Pt will have increased shoulder flextion of >  135 degrees in both shoulders so that she can use her arms better at home  Baseline: 110 on right and 120 on left  Goal status: INITIAL  3.  Pt will report that she can put things on the  top shelf of her cabinets at home without using a stepstool  Baseline: unable  Goal status: INITIAL  4.  Pt will increase to 10 reps of sit to stand in 30  seconds indicating an improvement in activity tolerance and functional strength  Baseline: 6 Goal status: INITIAL  PLAN:  PT FREQUENCY: 1-2x/week  PT DURATION: 8 weeks  PLANNED INTERVENTIONS: Therapeutic exercises, Therapeutic activity, Neuromuscular re-education, Balance training, Gait training, Patient/Family education, Self Care, Joint mobilization, scar mobilization, and Manual therapy  PLAN FOR NEXT SESSION: Cont manual work for soft tissue and scar mobilization, passive and A/AA/ROM excise for shoulders; add nustep and functional endurance exercise   Hermenia Bers, PTA 04/29/2023, 3:03 PM   [place] [facility] [address] [place], [place], [address] Phone: [phone]   Fax:  [phone]

## 2023-05-01 ENCOUNTER — Ambulatory Visit: Payer: 59

## 2023-05-01 DIAGNOSIS — M25611 Stiffness of right shoulder, not elsewhere classified: Secondary | ICD-10-CM | POA: Diagnosis not present

## 2023-05-01 DIAGNOSIS — Z9189 Other specified personal risk factors, not elsewhere classified: Secondary | ICD-10-CM

## 2023-05-01 DIAGNOSIS — Z483 Aftercare following surgery for neoplasm: Secondary | ICD-10-CM

## 2023-05-01 DIAGNOSIS — M25612 Stiffness of left shoulder, not elsewhere classified: Secondary | ICD-10-CM

## 2023-05-01 DIAGNOSIS — Z9013 Acquired absence of bilateral breasts and nipples: Secondary | ICD-10-CM | POA: Diagnosis not present

## 2023-05-01 NOTE — Progress Notes (Signed)
Patient Care Team: Merri Brunette, MD as PCP - General (Internal Medicine) Silverio Lay, MD as Attending Physician (Obstetrics and Gynecology) Serena Croissant, MD as Consulting Physician (Hematology and Oncology) Almond Lint, MD as Consulting Physician (General Surgery) Marinus Maw, MD as Consulting Physician (Cardiology) Pershing Proud, RN as Oncology Nurse Navigator Donnelly Angelica, RN as Oncology Nurse Navigator  DIAGNOSIS: No diagnosis found.  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%, PR 0%, HER-2 negative, Ki-67 31%   09/26/2005 - 11/13/2005 Radiation Therapy   Adjuvant radiation therapy   03/18/2008 Initial Biopsy   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   03/22/2008 Surgery   Right breast lumpectomy 0.9 cm DCIS ER 90% PR 98% stage 0   04/11/2008 Procedure   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   05/10/2008 - 07/02/2008 Radiation Therapy   Adjuvant radiation therapy   07/17/2008 - 04/16/2009 Anti-estrogen oral therapy   Femara then switched to tamoxifen which was discontinued in 2010 due to concern about uterine cancer   05/31/2021 Cancer Staging   Staging form: Breast, AJCC 7th Edition - Clinical: Stage IA (rT1c, N0, M0) - Signed by Serena Croissant, MD on 05/31/2021 Stage prefix: Recurrence Laterality: Right Biopsy of metastatic site performed:  No Estrogen receptor status: Positive Progesterone receptor status: Negative HER2 status: Positive   09/12/2021 Cancer Staging   Staging form: Breast, AJCC 7th Edition - Pathologic: Stage IIA (T2, N0, cM0) - Signed by Serena Croissant, MD on 09/12/2021 Estrogen receptor status: Positive Progesterone receptor status: Positive HER2 status: Negative   10/05/2022 Relapse/Recurrence   Palpable lump in the right mastectomy scar: 1.4 x 1.3 x 1.3 cm and a 0.4 cm mass (presence of small residual fluid): Biopsy revealed grade 3 IDC with necrosis ER 90% PR 0% Ki-67 70%, HER2 3+ positive   11/06/2022 Imaging   CT chest/abdomen/pelvis Developing small nodule along the right chest wall lateral to the pectoralis muscle inferior to the battery pack of the patient's defibrillator. A malignant lesion is possible. Recommend further workup such as ultrasound.   No developing other new mass lesion, fluid collection or lymph node enlargement.   Persistent sclerosis diffusely along the sternum.   Fatty liver infiltration.   Left-sided renal stone   Aortic atherosclerosis.  ICD 10 code-I70.0    11/06/2022 Imaging   Intense sternal/manubrial radiotracer uptake corresponding with the expansile lucency and sclerosis seen on CT is compatible with osseous metastatic disease.   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 ER 90%, PR 0%, Ki-67 70%, HER2 3+ positive    02/06/2023 -  Chemotherapy   Patient is on Treatment Plan : BREAST MAINTENANCE Trastuzumab IV (6) or SQ (600) D1 q21d X 11 Cycles     Breast cancer of upper-outer quadrant of right female breast (HCC)  05/23/2021 Relapse/Recurrence   Screening detected right breast mass at 10 o'clock position measuring 1.2 cm, ultrasound-guided biopsy revealed grade 3 IDC ER 20%, PR 0%, HER2 positive 3+, Ki-67 50%   09/04/2021 Surgery   Bilateral mastectomies Left mastectomy: Benign Right mastectomy: Grade 3 IDC with DCIS 3.6 cm, 0/6 lymph nodes negative, ER 20%, PR 0%, HER2 positive by IHC, Ki-67 50%   11/21/2021 - 01/23/2022 Chemotherapy   Patient is on Treatment Plan : BREAST Trastuzumab q21d     04/2022 -  Anti-estrogen oral therapy   Anastrozole daily; changed to Exemestane     CHIEF COMPLIANT:   INTERVAL HISTORY: Jordan Willis is a   ALLERGIES:  is allergic to bactrim [sulfamethoxazole-trimethoprim], aspirin, sulfa antibiotics, and tape.  MEDICATIONS:  Current Outpatient Medications  Medication Sig Dispense Refill   albuterol (VENTOLIN HFA) 108 (90 Base) MCG/ACT inhaler Inhale 2 puffs into the lungs every 6 (six) hours as needed for wheezing or shortness of breath.     b complex vitamins capsule Take 1 capsule by mouth daily.     baclofen (LIORESAL) 10 MG tablet Take 10 mg by mouth as needed for muscle spasms.     bumetanide (BUMEX) 1 MG tablet TAKE 1 TABLET BY MOUTH EVERY DAY 90 tablet 1   cetirizine (ZYRTEC) 10 MG tablet Take 10 mg by mouth in the morning.     Cholecalciferol (VITAMIN D3) 25 MCG (1000 UT) CAPS Take 1,000 Units by mouth in the morning.     diclofenac Sodium (VOLTAREN) 1 % GEL Apply 1 Application topically 4 (four) times daily as needed (pain).     exemestane (AROMASIN) 25 MG tablet Take 1 tablet (25 mg total) by mouth daily after breakfast. 90 tablet 3   FARXIGA 10 MG TABS tablet  TAKE 1 TABLET BY MOUTH EVERY DAY BEFORE BREAKFAST 90 tablet 0   fluticasone (FLONASE) 50 MCG/ACT nasal spray Place 1 spray into both nostrils in the morning.     GLUCOSAMINE-CHONDROITIN PO Take 1 tablet by mouth every evening.      glucose blood (ONETOUCH VERIO) test strip TEST ONCE DAILY AS DIRECTED 90     ibuprofen (ADVIL) 200 MG tablet Take 400 mg by mouth every 6 (six) hours as needed for mild pain or moderate pain.     metFORMIN (GLUCOPHAGE-XR) 500 MG 24 hr tablet Take 500 mg by mouth 2 (two) times daily.     metoprolol succinate (TOPROL-XL) 25 MG 24 hr tablet TAKE 1 TABLET (25 MG TOTAL) BY MOUTH IN THE MORNING. HOLD IF SYSTOLIC BLOOD PRESSURE (TOP BLOOD PRESSURE NUMBER) LESS THAN 100 MMHG OR HEART RATE LESS THAN 60 BPM (PULSE). 90 tablet 3   Multiple Vitamin (MULTIVITAMIN WITH MINERALS) TABS tablet Take 1 tablet by mouth daily. Centrum Silver for Women 50+     nitroGLYCERIN (NITROSTAT) 0.4 MG SL tablet Place 0.4 mg under the tongue every 5 (five) minutes as needed for chest pain.     Olopatadine HCl 0.2 % SOLN Place 1 drop into both eyes daily. Pataday  ondansetron (ZOFRAN) 4 MG tablet Take 1 tablet (4 mg total) by mouth every 6 (six) hours as needed for nausea or vomiting. 20 tablet 0   OneTouch Delica Lancets 33G MISC Apply topically.     oxyCODONE (OXY IR/ROXICODONE) 5 MG immediate release tablet Take 1 tablet (5 mg total) by mouth every 6 (six) hours as needed for severe pain or moderate pain. 30 tablet 0   Probiotic Product (PROBIOTIC DAILY PO) Take 1 capsule by mouth in the morning.     rosuvastatin (CRESTOR) 20 MG tablet Take 20 mg by mouth at bedtime.     sacubitril-valsartan (ENTRESTO) 24-26 MG Take 1 tablet by mouth 2 (two) times daily. 60 tablet 6   sertraline (ZOLOFT) 50 MG tablet Take 50 mg by mouth in the morning.     spironolactone (ALDACTONE) 25 MG tablet TAKE 1 TABLET BY MOUTH EVERY DAY IN THE MORNING 90 tablet 0   No current facility-administered medications for this  visit.    PHYSICAL EXAMINATION: ECOG PERFORMANCE STATUS: {CHL ONC ECOG PS:[phone]}  There were no vitals filed for this visit. There were no vitals filed for this visit.  BREAST:*** No palpable masses or nodules in either right or left breasts. No palpable axillary supraclavicular or infraclavicular adenopathy no breast tenderness or nipple discharge. (exam performed in the presence of a chaperone)  LABORATORY DATA:  I have reviewed the data as listed    Latest Ref Rng & Units 11/26/2022    8:21 AM 09/19/2022    8:33 AM 08/01/2022    8:42 AM  CMP  Glucose 70 - 99 mg/dL 161  096  99   BUN 8 - 23 mg/dL 8  13  11    Creatinine 0.44 - 1.00 mg/dL 0.45  4.09  8.11   Sodium 135 - 145 mmol/L 137  135  139   Potassium 3.5 - 5.1 mmol/L 3.7  4.1  4.3   Chloride 98 - 111 mmol/L 102  98  101   CO2 22 - 32 mmol/L 23  24  26    Calcium 8.9 - 10.3 mg/dL 9.7  91.4  9.8     Lab Results  Component Value Date   WBC 4.7 11/26/2022   HGB 12.9 11/26/2022   HCT 39.4 11/26/2022   MCV 90.0 11/26/2022   PLT 254 11/26/2022   NEUTROABS 2.4 09/19/2022    ASSESSMENT & PLAN:  No problem-specific Assessment & Plan notes found for this encounter.    No orders of the defined types were placed in this encounter.  The patient has a good understanding of the overall plan. she agrees with it. she will call with any problems that may develop before the next visit here. Total time spent: 30 mins including face to face time and time spent for planning, charting and co-ordination of care   Sherlyn Lick, CMA 05/01/23    I Janan Ridge am acting as a Neurosurgeon for [employer]  ***

## 2023-05-01 NOTE — Therapy (Signed)
Patient Details  Name: Jordan Willis MRN: [id number] Date of Birth: [birth year] Referring Provider:  Santiago Glad, MD  Encounter Date: 05/01/2023   OUTPATIENT PHYSICAL THERAPY ONCOLOGY TREATMENT  Patient Name: Jordan Willis MRN: [id number] DOB:[birth year], 72 y.o., female Today's Date: 05/01/2023  END OF SESSION:  PT End of Session - 05/01/23 0908     Visit Number 5    Number of Visits 17    Date for PT Re-Evaluation 06/10/23    PT Start Time 0911   pt late   PT Stop Time 0955    PT Time Calculation (min) 44 min    Activity Tolerance Patient tolerated treatment well    Behavior During Therapy [facility] for tasks assessed/performed              Past Medical History:  Diagnosis Date   AICD (automatic cardioverter/defibrillator) present    Anemia    Anginal pain (HCC)    Anxiety    Arthritis    Lumbar spine DDD.   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   Diabetes mellitus without complication (HCC)    Type II   Dyspnea    When patient gets sick uses Pro-Air inhaler   Frequent sinus infections    GERD (gastroesophageal reflux disease)    occ   Goiter    History of cardiomegaly    History of cataract    Bilateral   History of kidney stones    08/28/2018 currently has a large kidney stone, asymptomatic at this time   History of migraine    History of uterine fibroid    History of uterine prolapse    Hyperlipidemia    Hypertension    LBBB (left bundle branch block)    Menopause 03/03/2012   Nasal turbinate hypertrophy 11/29/2016   Left inferior    Personal history of chemotherapy    Personal history of radiation therapy    Pneumonia    as a child   PONV (postoperative nausea and vomiting)    Sinusitis 2014   Sleep apnea    No CPAP   SUI (stress urinary incontinence, female)     SVD (spontaneous vaginal delivery)    x 2   Thyroid nodule    Past Surgical History:  Procedure Laterality Date   ABDOMINAL HYSTERECTOMY     BILATERAL SALPINGOOPHORECTOMY Bilateral    BIV ICD INSERTION CRT-D N/A 08/24/2022   Procedure: BIV ICD INSERTION CRT-D;  Surgeon: Marinus Maw, MD;  Location: [facility] INVASIVE CV LAB;  Service: Cardiovascular;  Laterality: N/A;   BLADDER SUSPENSION N/A 05/16/2017   Procedure: TRANSVAGINAL TAPE (TVT) PROCEDURE;  Surgeon: Osborn Coho, MD;  Location: [facility] ORS;  Service: Gynecology;  Laterality: N/A;   BREAST BIOPSY Right 2009   BREAST IMPLANT REMOVAL Bilateral 02/05/2022   Procedure: Removal bilateral breast implants;  Surgeon: Allena Napoleon, MD;  Location: [facility] OR;  Service: Plastics;  Laterality: Bilateral;   BREAST LUMPECTOMY Right 2009   BREAST LUMPECTOMY Left 2006   BREAST RECONSTRUCTION WITH PLACEMENT OF TISSUE EXPANDER AND FLEX HD (ACELLULAR HYDRATED DERMIS) Bilateral 09/04/2021   Procedure: BILATERAL BREAST RECONSTRUCTION WITH PLACEMENT OF TISSUE EXPANDER AND FLEX HD (ACELLULAR HYDRATED DERMIS);  Surgeon: Allena Napoleon, MD;  Location: [facility] OR;  Service: Plastics;  Laterality: Bilateral;  CARDIAC CATHETERIZATION     CATARACT EXTRACTION Left 10/23/2017   CATARACT EXTRACTION Right 11/06/2017   COLONOSCOPY  10/22/2009   Normal.  Repeat 5 years.  [name]   COLONOSCOPY WITH PROPOFOL N/A 12/21/2022   Procedure: COLONOSCOPY WITH PROPOFOL;  Surgeon: Jeani Hawking, MD;  Location: [facility] ENDOSCOPY;  Service: Gastroenterology;  Laterality: N/A;   CYSTOSCOPY N/A 05/16/2017   Procedure: CYSTOSCOPY;  Surgeon: Osborn Coho, MD;  Location: [facility] ORS;  Service: Gynecology;  Laterality: N/A;   DEBRIDEMENT AND CLOSURE WOUND Bilateral 10/03/2021   Procedure: Debridement bilateral mastectomy flaps;  Surgeon: Allena Napoleon, MD;  Location: [facility] OR;  Service: Plastics;  Laterality: Bilateral;  1.5 hour   EYE SURGERY     bilateral cataracts   LEFT HEART CATH AND CORONARY  ANGIOGRAPHY N/A 02/09/2020   Procedure: LEFT HEART CATH AND CORONARY ANGIOGRAPHY;  Surgeon: Elder Negus, MD;  Location: [facility] INVASIVE CV LAB;  Service: Cardiovascular;  Laterality: N/A;   LEFT HEART CATH AND CORONARY ANGIOGRAPHY N/A 03/27/2022   Procedure: LEFT HEART CATH AND CORONARY ANGIOGRAPHY;  Surgeon: Yates Decamp, MD;  Location: [facility] INVASIVE CV LAB;  Service: Cardiovascular;  Laterality: N/A;   MASS EXCISION Right 12/04/2022   Procedure: EXCISION OF RIGHT CHEST WALL MASS;  Surgeon: Almond Lint, MD;  Location: [facility] OR;  Service: General;  Laterality: Right;   MASTECTOMY W/ SENTINEL NODE BIOPSY Right 09/04/2021   Procedure: BILATERAL MASTECTOMIES WITH RIGHT SENTINEL LYMPH NODE BIOPSY;  Surgeon: Almond Lint, MD;  Location: [facility] OR;  Service: General;  Laterality: Right;   REMOVAL OF BILATERAL TISSUE EXPANDERS WITH PLACEMENT OF BILATERAL BREAST IMPLANTS Bilateral 10/03/2021   Procedure: REMOVAL OF BILATERAL TISSUE EXPANDERS WITH REPLACEMENT OF TISSUE EXPANDERS;  Surgeon: Allena Napoleon, MD;  Location: [facility] OR;  Service: Plastics;  Laterality: Bilateral;   REMOVAL OF BILATERAL TISSUE EXPANDERS WITH PLACEMENT OF BILATERAL BREAST IMPLANTS Bilateral 12/19/2021   Procedure: REMOVAL OF BILATERAL TISSUE EXPANDERS WITH PLACEMENT OF BILATERAL BREAST IMPLANTS;  Surgeon: Allena Napoleon, MD;  Location: [facility] OR;  Service: Plastics;  Laterality: Bilateral;   ROBOTIC ASSISTED TOTAL HYSTERECTOMY N/A 05/16/2017   Procedure: ROBOTIC ASSISTED TOTAL HYSTERECTOMY;  Surgeon: Silverio Lay, MD;  Location: [facility] ORS;  Service: Gynecology;  Laterality: N/A;   ROTATOR CUFF REPAIR Bilateral    SCAR REVISION N/A 12/04/2022   Procedure: REVISION OF MASTECTOMY SCAR;  Surgeon: Almond Lint, MD;  Location: [facility] OR;  Service: General;  Laterality: N/A;   SHOULDER ARTHROSCOPY WITH ROTATOR CUFF REPAIR AND SUBACROMIAL DECOMPRESSION Right 09/03/2018   Procedure: Right shoulder manipulation under anesthesia, exam under anesthesia, mini  open rotator cuff repair, subacromial decompression;  Surgeon: Jene Every, MD;  Location: [facility] ORS;  Service: Orthopedics;  Laterality: Right;  90 mins   TUBAL LIGATION     WISDOM TOOTH EXTRACTION     Patient Active Problem List   Diagnosis Date Noted   ICD (implantable cardioverter-defibrillator) in place 12/25/2022   Bradycardia 08/24/2022   BRCA2 gene mutation positive in female 07/30/2022   LBBB (left bundle branch block)    Chronic diastolic heart failure (HCC)    NICM (nonischemic cardiomyopathy) (HCC)    Breast cancer of upper-outer quadrant of right female breast (HCC) 09/04/2021   Diabetic neuropathy (HCC) 02/08/2021   Angina pectoris (HCC) 02/09/2020   Abnormal stress test 02/09/2020   Complete rotator cuff tear 09/03/2018   Pelvic prolapse 05/16/2017   Intractable chronic post-traumatic headache 11/19/2016   Malignant neoplasm of upper-outer quadrant of left breast in female,  estrogen receptor negative (HCC) 03/03/2012   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   Menopause 03/03/2012    PCP: Merri Brunette, MD  REFERRING PROVIDER: Santiago Glad, MD  REFERRING DIAG: breast cancer, stiffness in shoulders  Z98.890 (ICD-10-CM) - S/P scar revision THERAPY DIAG:  History of bilateral mastectomy  Aftercare following surgery for neoplasm  At risk for lymphedema  Stiffness of left shoulder, not elsewhere classified  Stiffness of right shoulder, not elsewhere classified  ONSET DATE:  February 2024   Rationale for Evaluation and Treatment: Rehabilitation  SUBJECTIVE:                                                                                                                                                                                           SUBJECTIVE STATEMENT:  I had a full body scan a few weeks back and I am hoping its all good. I am feeling better overall. I can reach better with both sides, but I am having some muscle spasms in the  right shoulder blade area. I have taken some muscle spasm pills and that seems to help.  PERTINENT HISTORY: Pt was diagnosed with right breast cancer dx 05/2021. She has BRCA2 genetic mutation and has had left breast cancer in 2006 with chemo/breast conservation and XRT. She then had right breast cancer (dcis) with lumpectomy and radiation only. Dr. Lurene Shadow was her surgeon. She presented this time with a palpable painful mass in the upper outer right breast. Dx imaging showed a 1.2 cm mass at 10 o'clock. 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 The cardiomyopathy recovered, but the neuropathy did not. Her sister and mother have both had breast cancer. Pt underwent bilateral mastectomies with right sentinel node biopsy and expander reconstruction (Pace) 09/04/21. Final path at that time was [id number] with 6 negative nodes and negative margins. Patient ended up having the expanders removed because of continued drainage and discomfort.  2.14.2024 : revision of left mastectomy scar and removal of mass on right lateral mastectomy scar. She was found to have a nodule there and diagnostic imaging was performed. Ultrasound showed a 1.3 cm irregular mass at 9:00. Core needle biopsy was performed and this demonstrated invasive ductal carcinoma, grade 3. This was weakly ER positive, PR negative, HER2 positive, Ki-67 70%. She is currently undergoing chemotherapy   Pt also has a pacemaker  and defibrillator.  She has a decrease ejection fraction of 30% likely exacerbated by the chemotherapy.    Review of Systems:  PAIN:  Are you having pain?  No She feels that she has tightness in her neck from stress  She rates the tightness across her chest to shoulders at 5-6/10   PRECAUTIONS: Fall and Other: at risk for lymphedema, cardiac precautions   WEIGHT BEARING RESTRICTIONS: No  FALLS:  Has patient fallen in last 6  months? No  LIVING ENVIRONMENT: Lives with: lives with their family OCCUPATION: does not work   LEISURE: wants to go to [employer] she can only walk one block before she fatigues   HAND DOMINANCE: right   PRIOR LEVEL OF FUNCTION: Independent though limited by fatigue   PATIENT GOALS: to get things back on the top shelf of her cabinets    OBJECTIVE:  COGNITION: Overall cognitive status: Within functional limits for tasks assessed   PALPATION: Tight limited skin excursion across both sides of her chest with limited scar mobility   OBSERVATIONS / OTHER ASSESSMENTS: pt with visible tightness at chest with contraction of scars and fullness above scars    POSTURE: forward head rounded shoulders   UPPER EXTREMITY AROM/PROM:  A/PROM RIGHT   eval   Shoulder extension 20  Shoulder flexion 110  Shoulder abduction 90  Shoulder internal rotation 20 with pain   Shoulder external rotation 90    (Blank rows = not tested)  A/PROM LEFT   eval  Shoulder extension 20  Shoulder flexion 120  Shoulder abduction 95  Shoulder internal rotation 5 with pain   Shoulder external rotation 90    (Blank rows = not tested)   UPPER EXTREMITY STRENGTH: less that 3/5 as pt is not able to lift arms to end range of her available range of motion, diffuse muscle atrophy visible   LYMPHEDEMA ASSESSMENTS:   SURGERY TYPE/DATE: multiple surgeries 2006, 2022, 2024  NUMBER OF LYMPH NODES REMOVED: 6  CHEMOTHERAPY: yes, ongoing   RADIATION:yes  HORMONE TREATMENT: yes   INFECTIONS: no  LYMPHEDEMA ASSESSMENTS:   LANDMARK RIGHT  eval  10 cm proximal to olecranon process 35  Olecranon process 28  10 cm proximal to ulnar styloid process 22.7  Just proximal to ulnar styloid process 17.5  Across hand at thumb web space 20  At base of 2nd digit 6.5  (Blank rows = not tested)  LANDMARK LEFT  eval  10 cm proximal to olecranon process 35  Olecranon process 27.5  10 cm proximal to ulnar  styloid process 21.5  Just proximal to ulnar styloid process 17.5  Across hand at thumb web space 20  At base of 2nd digit 6.5  (Blank rows = not tested)  FUNCTIONAL TESTS:  6 reps in  30 seconds chair stand test  " pt very slow      QUICK DASH SURVEY: 45.45   TODAY'S TREATMENT:  DATE:   05/01/23 STM to bil lateral chest wall/trunk/UTover palpably tight musculature being mindful of pacemaker on Rt chest, and to right UT/scapular area in SL Supine wand flexion and scaption x 5 P/ROM in supine to bil shoulders into flex, scaption and D2 , and ER with scapular depression throughout  04/29/23: Therapeutic Exercises Pulleys into flex and scaption x 2 mins each Roll yellow ball up wall into flexion x 10 and bil abd x 5 each returning therapist demo Supine over half foam for following: Bil UE horz abd, bil UE scaption into a "V" x 10 each; and then bil abd "snow angel" x 5, 5 sec holds  Manual Therapy P/ROM in supine to bil shoulders into flex, scaption and D2 with scapular depression throughout STM to bil lateral chest wall/trunk over palpably tight musculature being mindful of pacemaker on Rt chest  04/18/23: Manual Therapy P/ROM in supine to bil shoulders into flex, scaption and D2 with scapular depression throughout STM to bil lateral chest wall/trunk over palpably tight musculature Therapeutic Exercises Pulleys into flex and scaption x 2 mins each returning therapist demo and VC's during to decr scapular compensation Lt UE NTS on wall as pt reports this UE tingles when she tries to lay on it at night; encouraged to do this only about 5 reps at a time and to not overstretch the nerve.  Roll yellow ball on wall for flexion x 10 returning therapist demo and VC's with demo to decr scap compensation throughout. Pt able to do this with cuing   04/15/23: In  supine PROM to bilateral shoulders in to flexion, abduction and ER with increased tightness bilaterally and discomfort. Pt guarded throughout. At end of session pt had a spasm in her R pec that eased with STM and stretching. Instructed pt in seated pec stretch and encouraged her to try doorway or corner stretch at home as long as no sharp pain  04/09/2023 evaluation   PATIENT EDUCATION:  Education details: goals of therapy  Person educated: Patient Education method: Explanation Education comprehension: verbalized understanding  HOME EXERCISE PROGRAM: Pt encouraged to do the broomstick exercises and shoulder exercises she has previously been doing  Doorway or corner stretch  ASSESSMENT:  CLINICAL IMPRESSION:  Pt was late today so held pulleys and ball rolls to focus on Manual and AAROM. Pt requires occasional VC's to relax for PROM but when relaxed demonstrated good improvement in PROM.  OBJECTIVE IMPAIRMENTS: cardiopulmonary status limiting activity, decreased activity tolerance, decreased endurance, decreased ROM, decreased strength, increased fascial restrictions, impaired perceived functional ability, impaired UE functional use, and postural dysfunction.   ACTIVITY LIMITATIONS: carrying, lifting, standing, reach over head, and locomotion level  PARTICIPATION LIMITATIONS: meal prep, cleaning, laundry, and yard work  PERSONAL FACTORS: Age, Past/current experiences, and 3+ comorbidities: previous surgery, decreased cardiac status, onling chemotherapy   are also affecting patient's functional outcome.   REHAB POTENTIAL: Good  CLINICAL DECISION MAKING: Stable/uncomplicated  EVALUATION COMPLEXITY: Low  GOALS: Goals reviewed with patient? Yes  SHORT TERM GOALS: Target date: 05/09/2023  Pt will be independent in a basic exercise program for shoulder ROM and Strenth  Baseline:needs review Goal status: INITIAL  LONG TERM GOALS: Target date: 8/18.2024  Pt will decrease quick DASH  score to < 20 indicating an improvement in functional use of her UE's Baseline: 45.45 Goal status: INITIAL  2.  Pt will have increased shoulder flextion of >  135 degrees in both shoulders so that she can use her arms better at  home  Baseline: 110 on right and 120 on left  Goal status: INITIAL  3.  Pt will report that she can put things on the top shelf of her cabinets at home without using a stepstool  Baseline: unable  Goal status: INITIAL  4.  Pt will increase to 10 reps of sit to stand in 30 seconds indicating an improvement in activity tolerance and functional strength  Baseline: 6 Goal status: INITIAL  PLAN:  PT FREQUENCY: 1-2x/week  PT DURATION: 8 weeks  PLANNED INTERVENTIONS: Therapeutic exercises, Therapeutic activity, Neuromuscular re-education, Balance training, Gait training, Patient/Family education, Self Care, Joint mobilization, scar mobilization, and Manual therapy  PLAN FOR NEXT SESSION: Cont manual work for soft tissue and scar mobilization, passive and A/AA/ROM excise for shoulders; add nustep and functional endurance exercise   Waynette Buttery, PT 05/01/2023, 9:56 AM   [facility] [address] [place], [place], [address] Phone: [phone]   Fax:  [phone]

## 2023-05-02 ENCOUNTER — Inpatient Hospital Stay (HOSPITAL_BASED_OUTPATIENT_CLINIC_OR_DEPARTMENT_OTHER): Payer: 59 | Admitting: Hematology and Oncology

## 2023-05-02 ENCOUNTER — Inpatient Hospital Stay: Payer: 59 | Attending: Hematology and Oncology

## 2023-05-02 ENCOUNTER — Other Ambulatory Visit: Payer: Self-pay

## 2023-05-02 VITALS — BP 113/62 | HR 81 | Temp 97.2°F | Resp 16 | Wt 181.9 lb

## 2023-05-02 DIAGNOSIS — R519 Headache, unspecified: Secondary | ICD-10-CM | POA: Diagnosis not present

## 2023-05-02 DIAGNOSIS — Z79811 Long term (current) use of aromatase inhibitors: Secondary | ICD-10-CM | POA: Insufficient documentation

## 2023-05-02 DIAGNOSIS — C50412 Malignant neoplasm of upper-outer quadrant of left female breast: Secondary | ICD-10-CM | POA: Diagnosis not present

## 2023-05-02 DIAGNOSIS — Z171 Estrogen receptor negative status [ER-]: Secondary | ICD-10-CM

## 2023-05-02 DIAGNOSIS — Z803 Family history of malignant neoplasm of breast: Secondary | ICD-10-CM | POA: Diagnosis not present

## 2023-05-02 DIAGNOSIS — Z923 Personal history of irradiation: Secondary | ICD-10-CM | POA: Insufficient documentation

## 2023-05-02 DIAGNOSIS — Z9013 Acquired absence of bilateral breasts and nipples: Secondary | ICD-10-CM | POA: Diagnosis not present

## 2023-05-02 DIAGNOSIS — Z1509 Genetic susceptibility to other malignant neoplasm: Secondary | ICD-10-CM | POA: Diagnosis not present

## 2023-05-02 MED ORDER — ACETAMINOPHEN 325 MG PO TABS
650.0000 mg | ORAL_TABLET | Freq: Once | ORAL | Status: AC
  Administered 2023-05-02: 650 mg via ORAL

## 2023-05-02 MED ORDER — DIPHENHYDRAMINE HCL 25 MG PO CAPS
25.0000 mg | ORAL_CAPSULE | Freq: Once | ORAL | Status: AC
  Administered 2023-05-02: 25 mg via ORAL

## 2023-05-02 MED ORDER — TRASTUZUMAB-HYALURONIDASE-OYSK 600-10000 MG-UNT/5ML ~~LOC~~ SOLN
600.0000 mg | Freq: Once | SUBCUTANEOUS | Status: AC
  Administered 2023-05-02: 600 mg via SUBCUTANEOUS
  Filled 2023-05-02: qty 5

## 2023-05-02 NOTE — Assessment & Plan Note (Addendum)
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  Cycle 5 Herceptin (because of cardiomyopathy, Dr. Gala Romney is watching very closely)   Signatera: 01/02/2023: Positive 0.07 MTM/ml Signatera 04/17/2023: Negative  Bone scan 04/19/2023: Increased uptake within the sternum and manubrium similar to before.  Asymmetric uptake right breast  Continue with Herceptin infusions every 3 weeks and I will see the patient every 6 weeks.  Our plan is to obtain CT chest abdomen pelvis in 3 months.

## 2023-05-02 NOTE — Patient Instructions (Signed)
[facility]  Discharge Instructions: Thank you for choosing [facility] to provide your oncology and hematology care.   If you have a lab appointment with the Cancer Center, please go directly to the Cancer Center and check in at the registration area.   Wear comfortable clothing and clothing appropriate for easy access to any Portacath or PICC line.   We strive to give you quality time with your provider. You may need to reschedule your appointment if you arrive late (15 or more minutes).  Arriving late affects you and other patients whose appointments are after yours.  Also, if you miss three or more appointments without notifying the office, you may be dismissed from the clinic at the provider's discretion.      For prescription refill requests, have your pharmacy contact our office and allow 72 hours for refills to be completed.    Today you received the following chemotherapy and/or immunotherapy agents: Herceptin hylecta      To help prevent nausea and vomiting after your treatment, we encourage you to take your nausea medication as directed.  BELOW ARE SYMPTOMS THAT SHOULD BE REPORTED IMMEDIATELY: *FEVER GREATER THAN 100.4 F (38 C) OR HIGHER *CHILLS OR SWEATING *NAUSEA AND VOMITING THAT IS NOT CONTROLLED WITH YOUR NAUSEA MEDICATION *UNUSUAL SHORTNESS OF BREATH *UNUSUAL BRUISING OR BLEEDING *URINARY PROBLEMS (pain or burning when urinating, or frequent urination) *BOWEL PROBLEMS (unusual diarrhea, constipation, pain near the anus) TENDERNESS IN MOUTH AND THROAT WITH OR WITHOUT PRESENCE OF ULCERS (sore throat, sores in mouth, or a toothache) UNUSUAL RASH, SWELLING OR PAIN  UNUSUAL VAGINAL DISCHARGE OR ITCHING   Items with * indicate a potential emergency and should be followed up as soon as possible or go to the Emergency Department if any problems should occur.  Please show the CHEMOTHERAPY ALERT CARD or IMMUNOTHERAPY ALERT CARD  at check-in to the Emergency Department and triage nurse.  Should you have questions after your visit or need to cancel or reschedule your appointment, please contact [facility]  Dept: [phone]  and follow the prompts.  Office hours are 8:00 a.m. to 4:30 p.m. Monday - Friday. Please note that voicemails left after 4:00 p.m. may not be returned until the following business day.  We are closed weekends and major holidays. You have access to a nurse at all times for urgent questions. Please call the main number to the clinic Dept: [phone] and follow the prompts.   For any non-urgent questions, you may also contact your provider using MyChart. We now offer e-Visits for anyone 18 and older to request care online for non-urgent symptoms. For details visit [email or web address].   Also download the MyChart app! Go to the app store, search "MyChart", open the app, select [facility], and log in with your MyChart username and password.  

## 2023-05-06 ENCOUNTER — Other Ambulatory Visit: Payer: Self-pay | Admitting: Cardiology

## 2023-05-06 ENCOUNTER — Ambulatory Visit: Payer: 59

## 2023-05-06 DIAGNOSIS — Z9013 Acquired absence of bilateral breasts and nipples: Secondary | ICD-10-CM

## 2023-05-06 DIAGNOSIS — M25611 Stiffness of right shoulder, not elsewhere classified: Secondary | ICD-10-CM

## 2023-05-06 DIAGNOSIS — I428 Other cardiomyopathies: Secondary | ICD-10-CM

## 2023-05-06 DIAGNOSIS — M25612 Stiffness of left shoulder, not elsewhere classified: Secondary | ICD-10-CM | POA: Diagnosis not present

## 2023-05-06 DIAGNOSIS — Z9189 Other specified personal risk factors, not elsewhere classified: Secondary | ICD-10-CM | POA: Diagnosis not present

## 2023-05-06 DIAGNOSIS — Z483 Aftercare following surgery for neoplasm: Secondary | ICD-10-CM

## 2023-05-06 NOTE — Therapy (Signed)
Patient Details  Name: Jordan Willis MRN: [id number] Date of Birth: Sep 01, 1951 Referring Provider:  Santiago Glad, MD  Encounter Date: 05/06/2023   OUTPATIENT PHYSICAL THERAPY ONCOLOGY TREATMENT  Patient Name: Jordan Willis MRN: [id number] DOB:10/12/1951, 72 y.o., female Today's Date: 05/06/2023  END OF SESSION:  PT End of Session - 05/06/23 1202     Visit Number 6    Number of Visits 17    Date for PT Re-Evaluation 06/10/23    PT Start Time 1203    PT Stop Time 1259    PT Time Calculation (min) 56 min    Activity Tolerance Patient tolerated treatment well    Behavior During Therapy WFL for tasks assessed/performed              Past Medical History:  Diagnosis Date   AICD (automatic cardioverter/defibrillator) present    Anemia    Anginal pain (HCC)    Anxiety    Arthritis    Lumbar spine DDD.   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   Diabetes mellitus without complication (HCC)    Type II   Dyspnea    When patient gets sick uses Pro-Air inhaler   Frequent sinus infections    GERD (gastroesophageal reflux disease)    occ   Goiter    History of cardiomegaly    History of cataract    Bilateral   History of kidney stones    08/28/2018 currently has a large kidney stone, asymptomatic at this time   History of migraine    History of uterine fibroid    History of uterine prolapse    Hyperlipidemia    Hypertension    LBBB (left bundle branch block)    Menopause 03/03/2012   Nasal turbinate hypertrophy 11/29/2016   Left inferior    Personal history of chemotherapy    Personal history of radiation therapy    Pneumonia    as a child   PONV (postoperative nausea and vomiting)    Sinusitis 2014   Sleep apnea    No CPAP   SUI (stress urinary incontinence, female)    SVD  (spontaneous vaginal delivery)    x 2   Thyroid nodule    Past Surgical History:  Procedure Laterality Date   ABDOMINAL HYSTERECTOMY     BILATERAL SALPINGOOPHORECTOMY Bilateral    BIV ICD INSERTION CRT-D N/A 08/24/2022   Procedure: BIV ICD INSERTION CRT-D;  Surgeon: Marinus Maw, MD;  Location: [facility] INVASIVE CV LAB;  Service: Cardiovascular;  Laterality: N/A;   BLADDER SUSPENSION N/A 05/16/2017   Procedure: TRANSVAGINAL TAPE (TVT) PROCEDURE;  Surgeon: Osborn Coho, MD;  Location: [facility] ORS;  Service: Gynecology;  Laterality: N/A;   BREAST BIOPSY Right 2009   BREAST IMPLANT REMOVAL Bilateral 02/05/2022   Procedure: Removal bilateral breast implants;  Surgeon: Allena Napoleon, MD;  Location: [facility] OR;  Service: Plastics;  Laterality: Bilateral;   BREAST LUMPECTOMY Right 2009   BREAST LUMPECTOMY Left 2006   BREAST RECONSTRUCTION WITH PLACEMENT OF TISSUE EXPANDER AND FLEX HD (ACELLULAR HYDRATED DERMIS) Bilateral 09/04/2021   Procedure: BILATERAL BREAST RECONSTRUCTION WITH PLACEMENT OF TISSUE EXPANDER AND FLEX HD (ACELLULAR HYDRATED DERMIS);  Surgeon: Allena Napoleon, MD;  Location: [facility] OR;  Service: Plastics;  Laterality: Bilateral;   CARDIAC CATHETERIZATION  CATARACT EXTRACTION Left 10/23/2017   CATARACT EXTRACTION Right 11/06/2017   COLONOSCOPY  10/22/2009   Normal.  Repeat 5 years.  [name]   COLONOSCOPY WITH PROPOFOL N/A 12/21/2022   Procedure: COLONOSCOPY WITH PROPOFOL;  Surgeon: Jeani Hawking, MD;  Location: [facility];  Service: Gastroenterology;  Laterality: N/A;   CYSTOSCOPY N/A 05/16/2017   Procedure: CYSTOSCOPY;  Surgeon: Osborn Coho, MD;  Location: [facility] ORS;  Service: Gynecology;  Laterality: N/A;   DEBRIDEMENT AND CLOSURE WOUND Bilateral 10/03/2021   Procedure: Debridement bilateral mastectomy flaps;  Surgeon: Allena Napoleon, MD;  Location: [facility] OR;  Service: Plastics;  Laterality: Bilateral;  1.5 hour   EYE SURGERY     bilateral cataracts   LEFT HEART CATH AND CORONARY  ANGIOGRAPHY N/A 02/09/2020   Procedure: LEFT HEART CATH AND CORONARY ANGIOGRAPHY;  Surgeon: Elder Negus, MD;  Location: [facility] INVASIVE CV LAB;  Service: Cardiovascular;  Laterality: N/A;   LEFT HEART CATH AND CORONARY ANGIOGRAPHY N/A 03/27/2022   Procedure: LEFT HEART CATH AND CORONARY ANGIOGRAPHY;  Surgeon: Yates Decamp, MD;  Location: [facility] INVASIVE CV LAB;  Service: Cardiovascular;  Laterality: N/A;   MASS EXCISION Right 12/04/2022   Procedure: EXCISION OF RIGHT CHEST WALL MASS;  Surgeon: Almond Lint, MD;  Location: [facility] OR;  Service: General;  Laterality: Right;   MASTECTOMY W/ SENTINEL NODE BIOPSY Right 09/04/2021   Procedure: BILATERAL MASTECTOMIES WITH RIGHT SENTINEL LYMPH NODE BIOPSY;  Surgeon: Almond Lint, MD;  Location: [facility] OR;  Service: General;  Laterality: Right;   REMOVAL OF BILATERAL TISSUE EXPANDERS WITH PLACEMENT OF BILATERAL BREAST IMPLANTS Bilateral 10/03/2021   Procedure: REMOVAL OF BILATERAL TISSUE EXPANDERS WITH REPLACEMENT OF TISSUE EXPANDERS;  Surgeon: Allena Napoleon, MD;  Location: [facility] OR;  Service: Plastics;  Laterality: Bilateral;   REMOVAL OF BILATERAL TISSUE EXPANDERS WITH PLACEMENT OF BILATERAL BREAST IMPLANTS Bilateral 12/19/2021   Procedure: REMOVAL OF BILATERAL TISSUE EXPANDERS WITH PLACEMENT OF BILATERAL BREAST IMPLANTS;  Surgeon: Allena Napoleon, MD;  Location: [facility] OR;  Service: Plastics;  Laterality: Bilateral;   ROBOTIC ASSISTED TOTAL HYSTERECTOMY N/A 05/16/2017   Procedure: ROBOTIC ASSISTED TOTAL HYSTERECTOMY;  Surgeon: Silverio Lay, MD;  Location: [facility];  Service: Gynecology;  Laterality: N/A;   ROTATOR CUFF REPAIR Bilateral    SCAR REVISION N/A 12/04/2022   Procedure: REVISION OF MASTECTOMY SCAR;  Surgeon: Almond Lint, MD;  Location: [facility] OR;  Service: General;  Laterality: N/A;   SHOULDER ARTHROSCOPY WITH ROTATOR CUFF REPAIR AND SUBACROMIAL DECOMPRESSION Right 09/03/2018   Procedure: Right shoulder manipulation under anesthesia, exam under anesthesia, mini  open rotator cuff repair, subacromial decompression;  Surgeon: Jene Every, MD;  Location: [facility];  Service: Orthopedics;  Laterality: Right;  90 mins   TUBAL LIGATION     WISDOM TOOTH EXTRACTION     Patient Active Problem List   Diagnosis Date Noted   ICD (implantable cardioverter-defibrillator) in place 12/25/2022   Bradycardia 08/24/2022   BRCA2 gene mutation positive in female 07/30/2022   LBBB (left bundle branch block)    Chronic diastolic heart failure (HCC)    NICM (nonischemic cardiomyopathy) (HCC)    Breast cancer of upper-outer quadrant of right female breast (HCC) 09/04/2021   Diabetic neuropathy (HCC) 02/08/2021   Angina pectoris (HCC) 02/09/2020   Abnormal stress test 02/09/2020   Complete rotator cuff tear 09/03/2018   Pelvic prolapse 05/16/2017   Intractable chronic post-traumatic headache 11/19/2016   Malignant neoplasm of upper-outer quadrant of left breast in female, estrogen receptor negative (HCC) 03/03/2012  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   Menopause 03/03/2012    PCP: Merri Brunette, MD  REFERRING PROVIDER: Santiago Glad, MD  REFERRING DIAG: breast cancer, stiffness in shoulders  Z98.890 (ICD-10-CM) - S/P scar revision THERAPY DIAG:  History of bilateral mastectomy  Aftercare following surgery for neoplasm  At risk for lymphedema  Stiffness of left shoulder, not elsewhere classified  Stiffness of right shoulder, not elsewhere classified  ONSET DATE:  February 2024   Rationale for Evaluation and Treatment: Rehabilitation  SUBJECTIVE:                                                                                                                                                                                           SUBJECTIVE STATEMENT:  I had a muscle spasm behind my right neck last night when I was lying down. I have some spasm pills I can take, but I don't tell why I am getting them.. It felt good for a number  of days after I was last here. The tightness in my chest is getting better.  PERTINENT HISTORY: Pt was diagnosed with right breast cancer dx 05/2021. She has BRCA2 genetic mutation and has had left breast cancer in 2006 with chemo/breast conservation and XRT. She then had right breast cancer (dcis) with lumpectomy and radiation only. Dr. Lurene Shadow was her surgeon. She presented this time with a palpable painful mass in the upper outer right breast. Dx imaging showed a 1.2 cm mass at 10 o'clock. 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 The cardiomyopathy recovered, but the neuropathy did not. Her sister and mother have both had breast cancer. Pt underwent bilateral mastectomies with right sentinel node biopsy and expander reconstruction (Pace) 09/04/21. Final path at that time was [id number] with 6 negative nodes and negative margins. Patient ended up having the expanders removed because of continued drainage and discomfort.  2.14.2024 : revision of left mastectomy scar and removal of mass on right lateral mastectomy scar. She was found to have a nodule there and diagnostic imaging was performed. Ultrasound showed a 1.3 cm irregular mass at 9:00. Core needle biopsy was performed and this demonstrated invasive ductal carcinoma, grade 3. This was weakly ER positive, PR negative, HER2 positive, Ki-67 70%. She is currently undergoing chemotherapy   Pt also has a pacemaker  and defibrillator.  She has a decrease ejection fraction of 30% likely exacerbated by the chemotherapy.    Review of Systems:  PAIN:   PAIN:  Are you having pain? Yes NPRS scale: 3-4/10  under arm Pain location: right axilla Pain orientation: Right  PAIN TYPE: tight and hurt Pain description: intermittent  Aggravating factors: Stretching arm Relieving factors: rest She feels that she has tightness in her neck from stress  She rates the  tightness across her chest to shoulders at 5-6/10   PRECAUTIONS: Fall and Other: at risk for lymphedema, cardiac precautions   WEIGHT BEARING RESTRICTIONS: No  FALLS:  Has patient fallen in last 6 months? No  LIVING ENVIRONMENT: Lives with: lives with their family OCCUPATION: does not work   LEISURE: wants to go to [employer] she can only walk one block before she fatigues   HAND DOMINANCE: right   PRIOR LEVEL OF FUNCTION: Independent though limited by fatigue   PATIENT GOALS: to get things back on the top shelf of her cabinets    OBJECTIVE:  COGNITION: Overall cognitive status: Within functional limits for tasks assessed   PALPATION: Tight limited skin excursion across both sides of her chest with limited scar mobility   OBSERVATIONS / OTHER ASSESSMENTS: pt with visible tightness at chest with contraction of scars and fullness above scars    POSTURE: forward head rounded shoulders   UPPER EXTREMITY AROM/PROM:  A/PROM RIGHT   eval   Shoulder extension 20  Shoulder flexion 110  Shoulder abduction 90  Shoulder internal rotation 20 with pain   Shoulder external rotation 90    (Blank rows = not tested)  A/PROM LEFT   eval  Shoulder extension 20  Shoulder flexion 120  Shoulder abduction 95  Shoulder internal rotation 5 with pain   Shoulder external rotation 90    (Blank rows = not tested)   UPPER EXTREMITY STRENGTH: less that 3/5 as pt is not able to lift arms to end range of her available range of motion, diffuse muscle atrophy visible   LYMPHEDEMA ASSESSMENTS:   SURGERY TYPE/DATE: multiple surgeries 2006, 2022, 2024  NUMBER OF LYMPH NODES REMOVED: 6  CHEMOTHERAPY: yes, ongoing   RADIATION:yes  HORMONE TREATMENT: yes   INFECTIONS: no  LYMPHEDEMA ASSESSMENTS:   LANDMARK RIGHT  eval  10 cm proximal to olecranon process 35  Olecranon process 28  10 cm proximal to ulnar styloid process 22.7  Just proximal to ulnar styloid process 17.5   Across hand at thumb web space 20  At base of 2nd digit 6.5  (Blank rows = not tested)  LANDMARK LEFT  eval  10 cm proximal to olecranon process 35  Olecranon process 27.5  10 cm proximal to ulnar styloid process 21.5  Just proximal to ulnar styloid process 17.5  Across hand at thumb web space 20  At base of 2nd digit 6.5  (Blank rows = not tested)  FUNCTIONAL TESTS:  6 reps in  30 seconds chair stand test  " pt very slow      QUICK DASH SURVEY: 45.45   TODAY'S TREATMENT:  DATE:    05/06/2023 Pulleys x 2 min flex and abd Ball rolls on wall x 10 Supine bilateral UE AROM flexion, scaption, horizntal abd x 5 STM to bil lateral chest wall/trunk/UTover palpably tight musculature being mindful of pacemaker on Rt chest, and to right UT/scapular area in SL MFR to left chest region and right  chest avoiding pacemaker Supine wand flexion and scaption x 5 P/ROM in supine to bil shoulders into flex, scaption and D2 , and ER with scapular depression throughout  05/01/23 STM to bil lateral chest wall/trunk/UTover palpably tight musculature being mindful of pacemaker on Rt chest, and to right UT/scapular area in SL Supine wand flexion and scaption x 5 P/ROM in supine to bil shoulders into flex, scaption and D2 , and ER with scapular depression throughout  04/29/23: Therapeutic Exercises Pulleys into flex and scaption x 2 mins each Roll yellow ball up wall into flexion x 10 and bil abd x 5 each returning therapist demo Supine over half foam for following: Bil UE horz abd, bil UE scaption into a "V" x 10 each; and then bil abd "snow angel" x 5, 5 sec holds  Manual Therapy P/ROM in supine to bil shoulders into flex, scaption and D2 with scapular depression throughout STM to bil lateral chest wall/trunk over palpably tight musculature being mindful of  pacemaker on Rt chest  04/18/23: Manual Therapy P/ROM in supine to bil shoulders into flex, scaption and D2 with scapular depression throughout STM to bil lateral chest wall/trunk over palpably tight musculature Therapeutic Exercises Pulleys into flex and scaption x 2 mins each returning therapist demo and VC's during to decr scapular compensation Lt UE NTS on wall as pt reports this UE tingles when she tries to lay on it at night; encouraged to do this only about 5 reps at a time and to not overstretch the nerve.  Roll yellow ball on wall for flexion x 10 returning therapist demo and VC's with demo to decr scap compensation throughout. Pt able to do this with cuing   04/15/23: In supine PROM to bilateral shoulders in to flexion, abduction and ER with increased tightness bilaterally and discomfort. Pt guarded throughout. At end of session pt had a spasm in her R pec that eased with STM and stretching. Instructed pt in seated pec stretch and encouraged her to try doorway or corner stretch at home as long as no sharp pain  04/09/2023 evaluation   PATIENT EDUCATION:  Education details: goals of therapy  Person educated: Patient Education method: Explanation Education comprehension: verbalized understanding  HOME EXERCISE PROGRAM: Pt encouraged to do the broomstick exercises and shoulder exercises she has previously been doing  Doorway or corner stretch  ASSESSMENT:  CLINICAL IMPRESSION: Pt got a good response to STM after last visit with decreased spasm noted in right levator/UT.  Very tight there again today but improved after therapy. Pt was fatigued after therapy but demonstrates improvements in ROM    OBJECTIVE IMPAIRMENTS: cardiopulmonary status limiting activity, decreased activity tolerance, decreased endurance, decreased ROM, decreased strength, increased fascial restrictions, impaired perceived functional ability, impaired UE functional use, and postural dysfunction.   ACTIVITY  LIMITATIONS: carrying, lifting, standing, reach over head, and locomotion level  PARTICIPATION LIMITATIONS: meal prep, cleaning, laundry, and yard work  PERSONAL FACTORS: Age, Past/current experiences, and 3+ comorbidities: previous surgery, decreased cardiac status, onling chemotherapy   are also affecting patient's functional outcome.   REHAB POTENTIAL: Good  CLINICAL DECISION MAKING: Stable/uncomplicated  EVALUATION COMPLEXITY: Low  GOALS: Goals reviewed with patient? Yes  SHORT TERM GOALS: Target date: 05/09/2023  Pt will be independent in a basic exercise program for shoulder ROM and Strenth  Baseline:needs review Goal status: INITIAL  LONG TERM GOALS: Target date: 8/18.2024  Pt will decrease quick DASH score to < 20 indicating an improvement in functional use of her UE's Baseline: 45.45 Goal status: INITIAL  2.  Pt will have increased shoulder flextion of >  135 degrees in both shoulders so that she can use her arms better at home  Baseline: 110 on right and 120 on left  Goal status: INITIAL  3.  Pt will report that she can put things on the top shelf of her cabinets at home without using a stepstool  Baseline: unable  Goal status: INITIAL  4.  Pt will increase to 10 reps of sit to stand in 30 seconds indicating an improvement in activity tolerance and functional strength  Baseline: 6 Goal status: INITIAL  PLAN:  PT FREQUENCY: 1-2x/week  PT DURATION: 8 weeks  PLANNED INTERVENTIONS: Therapeutic exercises, Therapeutic activity, Neuromuscular re-education, Balance training, Gait training, Patient/Family education, Self Care, Joint mobilization, scar mobilization, and Manual therapy  PLAN FOR NEXT SESSION: Cont manual work for soft tissue and scar mobilization, passive and A/AA/ROM excise for shoulders; add nustep and functional endurance exercise   Waynette Buttery, PT 05/06/2023, 1:04 PM   [facility] [address] [place], [place], [address] Phone: [phone]   Fax:  [phone]

## 2023-05-06 NOTE — Patient Instructions (Signed)
SHOULDER: Flexion - Supine (Cane)        Cancer Rehab [phone]    Hold cane in both hands. Raise arms up overhead. Do not allow back to arch. Hold _5__ seconds. Do __5__ times; __2__ times a day. Hands shoulder width Hands wider than shoulder width   Shoulder Blade Stretch    Clasp fingers behind head with elbows touching in front of face. Pull elbows back while pressing shoulder blades together. Relax and hold as tolerated, can place pillow under elbow here for comfort as needed and to allow for prolonged stretch.  Repeat __5__ times. Do __1-2__ sessions per day.     Copyright  VHI. All rights reserved.

## 2023-05-07 ENCOUNTER — Other Ambulatory Visit: Payer: Self-pay | Admitting: Cardiology

## 2023-05-07 ENCOUNTER — Telehealth: Payer: Self-pay

## 2023-05-07 DIAGNOSIS — I428 Other cardiomyopathies: Secondary | ICD-10-CM

## 2023-05-07 DIAGNOSIS — I10 Essential (primary) hypertension: Secondary | ICD-10-CM

## 2023-05-07 DIAGNOSIS — I5042 Chronic combined systolic (congestive) and diastolic (congestive) heart failure: Secondary | ICD-10-CM

## 2023-05-07 DIAGNOSIS — I429 Cardiomyopathy, unspecified: Secondary | ICD-10-CM

## 2023-05-07 NOTE — Telephone Encounter (Signed)
Patient went to the pharmacy to get her Spironolactone filled and was told that it could mess up her kidneys, I told her that you have checked her kidney function and it was normal, read her lost [place] with labs from feb 2024, she said I need to call the pharmacy and let them know its ok.

## 2023-05-09 ENCOUNTER — Ambulatory Visit: Payer: 59

## 2023-05-09 DIAGNOSIS — Z483 Aftercare following surgery for neoplasm: Secondary | ICD-10-CM

## 2023-05-09 DIAGNOSIS — R197 Diarrhea, unspecified: Secondary | ICD-10-CM | POA: Diagnosis not present

## 2023-05-09 DIAGNOSIS — Z9189 Other specified personal risk factors, not elsewhere classified: Secondary | ICD-10-CM | POA: Diagnosis not present

## 2023-05-09 DIAGNOSIS — Z9013 Acquired absence of bilateral breasts and nipples: Secondary | ICD-10-CM | POA: Diagnosis not present

## 2023-05-09 DIAGNOSIS — M25611 Stiffness of right shoulder, not elsewhere classified: Secondary | ICD-10-CM

## 2023-05-09 DIAGNOSIS — M25612 Stiffness of left shoulder, not elsewhere classified: Secondary | ICD-10-CM

## 2023-05-09 DIAGNOSIS — N3000 Acute cystitis without hematuria: Secondary | ICD-10-CM | POA: Diagnosis not present

## 2023-05-09 NOTE — Therapy (Signed)
Patient Details  Name: Jordan Willis MRN: [id number] Date of Birth: Jan 13, 1951 Referring Provider:  Santiago Glad, MD  Encounter Date: 05/09/2023   OUTPATIENT PHYSICAL THERAPY ONCOLOGY TREATMENT  Patient Name: Jordan Willis MRN: [id number] DOB:1951/07/22, 72 y.o., female Today's Date: 05/09/2023  END OF SESSION:  PT End of Session - 05/09/23 1211     Visit Number 7    Number of Visits 17    Date for PT Re-Evaluation 06/10/23    PT Start Time 1211   pt late   PT Stop Time 1300    PT Time Calculation (min) 49 min    Activity Tolerance Patient tolerated treatment well    Behavior During Therapy [facility] for tasks assessed/performed              Past Medical History:  Diagnosis Date   AICD (automatic cardioverter/defibrillator) present    Anemia    Anginal pain (HCC)    Anxiety    Arthritis    Lumbar spine DDD.   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   Diabetes mellitus without complication (HCC)    Type II   Dyspnea    When patient gets sick uses Pro-Air inhaler   Frequent sinus infections    GERD (gastroesophageal reflux disease)    occ   Goiter    History of cardiomegaly    History of cataract    Bilateral   History of kidney stones    08/28/2018 currently has a large kidney stone, asymptomatic at this time   History of migraine    History of uterine fibroid    History of uterine prolapse    Hyperlipidemia    Hypertension    LBBB (left bundle branch block)    Menopause 03/03/2012   Nasal turbinate hypertrophy 11/29/2016   Left inferior    Personal history of chemotherapy    Personal history of radiation therapy    Pneumonia    as a child   PONV (postoperative nausea and vomiting)    Sinusitis 2014   Sleep apnea    No CPAP   SUI (stress urinary incontinence, female)     SVD (spontaneous vaginal delivery)    x 2   Thyroid nodule    Past Surgical History:  Procedure Laterality Date   ABDOMINAL HYSTERECTOMY     BILATERAL SALPINGOOPHORECTOMY Bilateral    BIV ICD INSERTION CRT-D N/A 08/24/2022   Procedure: BIV ICD INSERTION CRT-D;  Surgeon: Marinus Maw, MD;  Location: [facility] INVASIVE CV LAB;  Service: Cardiovascular;  Laterality: N/A;   BLADDER SUSPENSION N/A 05/16/2017   Procedure: TRANSVAGINAL TAPE (TVT) PROCEDURE;  Surgeon: Osborn Coho, MD;  Location: [facility] ORS;  Service: Gynecology;  Laterality: N/A;   BREAST BIOPSY Right 2009   BREAST IMPLANT REMOVAL Bilateral 02/05/2022   Procedure: Removal bilateral breast implants;  Surgeon: Allena Napoleon, MD;  Location: [facility] OR;  Service: Plastics;  Laterality: Bilateral;   BREAST LUMPECTOMY Right 2009   BREAST LUMPECTOMY Left 2006   BREAST RECONSTRUCTION WITH PLACEMENT OF TISSUE EXPANDER AND FLEX HD (ACELLULAR HYDRATED DERMIS) Bilateral 09/04/2021   Procedure: BILATERAL BREAST RECONSTRUCTION WITH PLACEMENT OF TISSUE EXPANDER AND FLEX HD (ACELLULAR HYDRATED DERMIS);  Surgeon: Allena Napoleon, MD;  Location: [facility] OR;  Service: Plastics;  Laterality: Bilateral;  CARDIAC CATHETERIZATION     CATARACT EXTRACTION Left 10/23/2017   CATARACT EXTRACTION Right 11/06/2017   COLONOSCOPY  10/22/2009   Normal.  Repeat 5 years.  [name]   COLONOSCOPY WITH PROPOFOL N/A 12/21/2022   Procedure: COLONOSCOPY WITH PROPOFOL;  Surgeon: Jeani Hawking, MD;  Location: [facility] ENDOSCOPY;  Service: Gastroenterology;  Laterality: N/A;   CYSTOSCOPY N/A 05/16/2017   Procedure: CYSTOSCOPY;  Surgeon: Osborn Coho, MD;  Location: [facility] ORS;  Service: Gynecology;  Laterality: N/A;   DEBRIDEMENT AND CLOSURE WOUND Bilateral 10/03/2021   Procedure: Debridement bilateral mastectomy flaps;  Surgeon: Allena Napoleon, MD;  Location: [facility] OR;  Service: Plastics;  Laterality: Bilateral;  1.5 hour   EYE SURGERY     bilateral cataracts   LEFT HEART CATH AND CORONARY  ANGIOGRAPHY N/A 02/09/2020   Procedure: LEFT HEART CATH AND CORONARY ANGIOGRAPHY;  Surgeon: Elder Negus, MD;  Location: [facility] INVASIVE CV LAB;  Service: Cardiovascular;  Laterality: N/A;   LEFT HEART CATH AND CORONARY ANGIOGRAPHY N/A 03/27/2022   Procedure: LEFT HEART CATH AND CORONARY ANGIOGRAPHY;  Surgeon: Yates Decamp, MD;  Location: [facility] INVASIVE CV LAB;  Service: Cardiovascular;  Laterality: N/A;   MASS EXCISION Right 12/04/2022   Procedure: EXCISION OF RIGHT CHEST WALL MASS;  Surgeon: Almond Lint, MD;  Location: [facility] OR;  Service: General;  Laterality: Right;   MASTECTOMY W/ SENTINEL NODE BIOPSY Right 09/04/2021   Procedure: BILATERAL MASTECTOMIES WITH RIGHT SENTINEL LYMPH NODE BIOPSY;  Surgeon: Almond Lint, MD;  Location: [facility] OR;  Service: General;  Laterality: Right;   REMOVAL OF BILATERAL TISSUE EXPANDERS WITH PLACEMENT OF BILATERAL BREAST IMPLANTS Bilateral 10/03/2021   Procedure: REMOVAL OF BILATERAL TISSUE EXPANDERS WITH REPLACEMENT OF TISSUE EXPANDERS;  Surgeon: Allena Napoleon, MD;  Location: [facility] OR;  Service: Plastics;  Laterality: Bilateral;   REMOVAL OF BILATERAL TISSUE EXPANDERS WITH PLACEMENT OF BILATERAL BREAST IMPLANTS Bilateral 12/19/2021   Procedure: REMOVAL OF BILATERAL TISSUE EXPANDERS WITH PLACEMENT OF BILATERAL BREAST IMPLANTS;  Surgeon: Allena Napoleon, MD;  Location: [facility] OR;  Service: Plastics;  Laterality: Bilateral;   ROBOTIC ASSISTED TOTAL HYSTERECTOMY N/A 05/16/2017   Procedure: ROBOTIC ASSISTED TOTAL HYSTERECTOMY;  Surgeon: Silverio Lay, MD;  Location: [facility] ORS;  Service: Gynecology;  Laterality: N/A;   ROTATOR CUFF REPAIR Bilateral    SCAR REVISION N/A 12/04/2022   Procedure: REVISION OF MASTECTOMY SCAR;  Surgeon: Almond Lint, MD;  Location: [facility] OR;  Service: General;  Laterality: N/A;   SHOULDER ARTHROSCOPY WITH ROTATOR CUFF REPAIR AND SUBACROMIAL DECOMPRESSION Right 09/03/2018   Procedure: Right shoulder manipulation under anesthesia, exam under anesthesia, mini  open rotator cuff repair, subacromial decompression;  Surgeon: Jene Every, MD;  Location: [facility] ORS;  Service: Orthopedics;  Laterality: Right;  90 mins   TUBAL LIGATION     WISDOM TOOTH EXTRACTION     Patient Active Problem List   Diagnosis Date Noted   ICD (implantable cardioverter-defibrillator) in place 12/25/2022   Bradycardia 08/24/2022   BRCA2 gene mutation positive in female 07/30/2022   LBBB (left bundle branch block)    Chronic diastolic heart failure (HCC)    NICM (nonischemic cardiomyopathy) (HCC)    Breast cancer of upper-outer quadrant of right female breast (HCC) 09/04/2021   Diabetic neuropathy (HCC) 02/08/2021   Angina pectoris (HCC) 02/09/2020   Abnormal stress test 02/09/2020   Complete rotator cuff tear 09/03/2018   Pelvic prolapse 05/16/2017   Intractable chronic post-traumatic headache 11/19/2016   Malignant neoplasm of upper-outer quadrant of left breast in female,  estrogen receptor negative (HCC) 03/03/2012   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   Menopause 03/03/2012    PCP: Merri Brunette, MD  REFERRING PROVIDER: Santiago Glad, MD  REFERRING DIAG: breast cancer, stiffness in shoulders  Z98.890 (ICD-10-CM) - S/P scar revision THERAPY DIAG:  History of bilateral mastectomy  Aftercare following surgery for neoplasm  At risk for lymphedema  Stiffness of left shoulder, not elsewhere classified  Stiffness of right shoulder, not elsewhere classified  ONSET DATE:  February 2024   Rationale for Evaluation and Treatment: Rehabilitation  SUBJECTIVE:                                                                                                                                                                                           SUBJECTIVE STATEMENT:  I am having pain when I urinate. I haven't had any spasms in my right shoulder. I have done the exercises 1 time per day. I am having some pain at the side under my right  arm.  PERTINENT HISTORY: Pt was diagnosed with right breast cancer dx 05/2021. She has BRCA2 genetic mutation and has had left breast cancer in 2006 with chemo/breast conservation and XRT. She then had right breast cancer (dcis) with lumpectomy and radiation only. Dr. Lurene Shadow was her surgeon. She presented this time with a palpable painful mass in the upper outer right breast. Dx imaging showed a 1.2 cm mass at 10 o'clock. 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 The cardiomyopathy recovered, but the neuropathy did not. Her sister and mother have both had breast cancer. Pt underwent bilateral mastectomies with right sentinel node biopsy and expander reconstruction (Pace) 09/04/21. Final path at that time was [id number] with 6 negative nodes and negative margins. Patient ended up having the expanders removed because of continued drainage and discomfort.  2.14.2024 : revision of left mastectomy scar and removal of mass on right lateral mastectomy scar. She was found to have a nodule there and diagnostic imaging was performed. Ultrasound showed a 1.3 cm irregular mass at 9:00. Core needle biopsy was performed and this demonstrated invasive ductal carcinoma, grade 3. This was weakly ER positive, PR negative, HER2 positive, Ki-67 70%. She is currently undergoing chemotherapy   Pt also has a pacemaker  and defibrillator.  She has a decrease ejection fraction of 30% likely exacerbated by the chemotherapy.    Review of Systems:  PAIN:   PAIN:  Are you having pain? Yes NPRS scale: 2-3/10 under arm Pain location: right axilla Pain orientation: Right  PAIN TYPE: tight and hurt Pain description: intermittent  Aggravating factors: Stretching arm Relieving factors: rest She feels that she has tightness in her neck from stress  She rates the tightness across her chest to shoulders at 5-6/10   PRECAUTIONS: Fall and  Other: at risk for lymphedema, cardiac precautions   WEIGHT BEARING RESTRICTIONS: No  FALLS:  Has patient fallen in last 6 months? No  LIVING ENVIRONMENT: Lives with: lives with their family OCCUPATION: does not work   LEISURE: wants to go to [employer] she can only walk one block before she fatigues   HAND DOMINANCE: right   PRIOR LEVEL OF FUNCTION: Independent though limited by fatigue   PATIENT GOALS: to get things back on the top shelf of her cabinets    OBJECTIVE:  COGNITION: Overall cognitive status: Within functional limits for tasks assessed   PALPATION: Tight limited skin excursion across both sides of her chest with limited scar mobility   OBSERVATIONS / OTHER ASSESSMENTS: pt with visible tightness at chest with contraction of scars and fullness above scars    POSTURE: forward head rounded shoulders   UPPER EXTREMITY AROM/PROM:  A/PROM RIGHT   eval  RIGHT 05/09/2023  Shoulder extension 20 46  Shoulder flexion 110 138  Shoulder abduction 90 130  Shoulder internal rotation 20 with pain  40  Shoulder external rotation 90 97    (Blank rows = not tested)  A/PROM LEFT   eval LEFT 05/09/2023  Shoulder extension 20 45  Shoulder flexion 120 135  Shoulder abduction 95 130  Shoulder internal rotation 5 with pain  40  Shoulder external rotation 90 102    (Blank rows = not tested)   UPPER EXTREMITY STRENGTH: less that 3/5 as pt is not able to lift arms to end range of her available range of motion, diffuse muscle atrophy visible   LYMPHEDEMA ASSESSMENTS:   SURGERY TYPE/DATE: multiple surgeries 2006, 2022, 2024  NUMBER OF LYMPH NODES REMOVED: 6  CHEMOTHERAPY: yes, ongoing   RADIATION:yes  HORMONE TREATMENT: yes   INFECTIONS: no  LYMPHEDEMA ASSESSMENTS:   LANDMARK RIGHT  eval  10 cm proximal to olecranon process 35  Olecranon process 28  10 cm proximal to ulnar styloid process 22.7  Just proximal to ulnar styloid process 17.5  Across hand  at thumb web space 20  At base of 2nd digit 6.5  (Blank rows = not tested)  LANDMARK LEFT  eval  10 cm proximal to olecranon process 35  Olecranon process 27.5  10 cm proximal to ulnar styloid process 21.5  Just proximal to ulnar styloid process 17.5  Across hand at thumb web space 20  At base of 2nd digit 6.5  (Blank rows = not tested)  FUNCTIONAL TESTS:  6 reps in  30 seconds chair stand test  " pt very slow      QUICK DASH SURVEY: 45.45   TODAY'S TREATMENT:  DATE:   05/09/2023 STM to bil lateral chest wall/trunk/UTover palpably tight musculature being mindful of pacemaker on Rt chest, and to right UT/scapular area in SL P/ROM in supine to bil shoulders into flex, scaption and D2 , and ER with scapular depression throughout Supine bilateral AROM flexion, scaption, ABD Measured AROM Jobes flexion x 5   05/06/2023 Pulleys x 2 min flex and abd Ball rolls on wall x 10 Supine bilateral UE AROM flexion, scaption, horizntal abd x 5 STM to bil lateral chest wall/trunk/UTover palpably tight musculature being mindful of pacemaker on Rt chest, and to right UT/scapular area in SL MFR to left chest region and right  chest avoiding pacemaker Supine wand flexion and scaption x 5 P/ROM in supine to bil shoulders into flex, scaption and D2 , and ER with scapular depression throughout  05/01/23 STM to bil lateral chest wall/trunk/UTover palpably tight musculature being mindful of pacemaker on Rt chest, and to right UT/scapular area in SL Supine wand flexion and scaption x 5 P/ROM in supine to bil shoulders into flex, scaption and D2 , and ER with scapular depression throughout  04/29/23: Therapeutic Exercises Pulleys into flex and scaption x 2 mins each Roll yellow ball up wall into flexion x 10 and bil abd x 5 each returning therapist demo Supine over  half foam for following: Bil UE horz abd, bil UE scaption into a "V" x 10 each; and then bil abd "snow angel" x 5, 5 sec holds  Manual Therapy P/ROM in supine to bil shoulders into flex, scaption and D2 with scapular depression throughout STM to bil lateral chest wall/trunk over palpably tight musculature being mindful of pacemaker on Rt chest  04/18/23: Manual Therapy P/ROM in supine to bil shoulders into flex, scaption and D2 with scapular depression throughout STM to bil lateral chest wall/trunk over palpably tight musculature Therapeutic Exercises Pulleys into flex and scaption x 2 mins each returning therapist demo and VC's during to decr scapular compensation Lt UE NTS on wall as pt reports this UE tingles when she tries to lay on it at night; encouraged to do this only about 5 reps at a time and to not overstretch the nerve.  Roll yellow ball on wall for flexion x 10 returning therapist demo and VC's with demo to decr scap compensation throughout. Pt able to do this with cuing   04/15/23: In supine PROM to bilateral shoulders in to flexion, abduction and ER with increased tightness bilaterally and discomfort. Pt guarded throughout. At end of session pt had a spasm in her R pec that eased with STM and stretching. Instructed pt in seated pec stretch and encouraged her to try doorway or corner stretch at home as long as no sharp pain  04/09/2023 evaluation   PATIENT EDUCATION:  Education details: goals of therapy  Person educated: Patient Education method: Explanation Education comprehension: verbalized understanding  HOME EXERCISE PROGRAM: Pt encouraged to do the broomstick exercises and shoulder exercises she has previously been doing  Doorway or corner stretch  ASSESSMENT:  CLINICAL IMPRESSION: Pt has not had a spasm on the right side in several days. AROM greatly improved when measured after treatment however, pt is still weak and is not able to actively go to end ranges in  standing.  OBJECTIVE IMPAIRMENTS: cardiopulmonary status limiting activity, decreased activity tolerance, decreased endurance, decreased ROM, decreased strength, increased fascial restrictions, impaired perceived functional ability, impaired UE functional use, and postural dysfunction.   ACTIVITY LIMITATIONS: carrying, lifting, standing, reach over  head, and locomotion level  PARTICIPATION LIMITATIONS: meal prep, cleaning, laundry, and yard work  PERSONAL FACTORS: Age, Past/current experiences, and 3+ comorbidities: previous surgery, decreased cardiac status, onling chemotherapy   are also affecting patient's functional outcome.   REHAB POTENTIAL: Good  CLINICAL DECISION MAKING: Stable/uncomplicated  EVALUATION COMPLEXITY: Low  GOALS: Goals reviewed with patient? Yes  SHORT TERM GOALS: Target date: 05/09/2023  Pt will be independent in a basic exercise program for shoulder ROM and Strenth  Baseline:needs review Goal status: INITIAL  LONG TERM GOALS: Target date: 8/18.2024  Pt will decrease quick DASH score to < 20 indicating an improvement in functional use of her UE's Baseline: 45.45 Goal status: INITIAL  2.  Pt will have increased shoulder flextion of >  135 degrees in both shoulders so that she can use her arms better at home  Baseline: 110 on right and 120 on left  Goal status: INITIAL  3.  Pt will report that she can put things on the top shelf of her cabinets at home without using a stepstool  Baseline: unable  Goal status: INITIAL  4.  Pt will increase to 10 reps of sit to stand in 30 seconds indicating an improvement in activity tolerance and functional strength  Baseline: 6 Goal status: INITIAL  PLAN:  PT FREQUENCY: 1-2x/week  PT DURATION: 8 weeks  PLANNED INTERVENTIONS: Therapeutic exercises, Therapeutic activity, Neuromuscular re-education, Balance training, Gait training, Patient/Family education, Self Care, Joint mobilization, scar mobilization, and Manual  therapy  PLAN FOR NEXT SESSION: Cont manual work for soft tissue and scar mobilization, passive and A/AA/ROM excise for shoulders; add strength,add nustep and functional endurance exercise   Waynette Buttery, PT 05/09/2023, 1:04 PM   [facility] [address] [place], [place], [address] Phone: [phone]   Fax:  [phone]

## 2023-05-16 DIAGNOSIS — C50911 Malignant neoplasm of unspecified site of right female breast: Secondary | ICD-10-CM | POA: Diagnosis not present

## 2023-05-16 DIAGNOSIS — C50912 Malignant neoplasm of unspecified site of left female breast: Secondary | ICD-10-CM | POA: Diagnosis not present

## 2023-05-16 NOTE — Telephone Encounter (Signed)
Spoke to the patient. She has been reassured that her labs have been checked frequently. She also followed up with PCP.  Carsten Carstarphen [place], DO, [facility]

## 2023-05-22 ENCOUNTER — Other Ambulatory Visit: Payer: Self-pay | Admitting: Hematology and Oncology

## 2023-05-22 ENCOUNTER — Encounter: Payer: Self-pay | Admitting: Hematology and Oncology

## 2023-05-22 DIAGNOSIS — Z171 Estrogen receptor negative status [ER-]: Secondary | ICD-10-CM

## 2023-05-23 ENCOUNTER — Other Ambulatory Visit: Payer: Self-pay

## 2023-05-23 ENCOUNTER — Inpatient Hospital Stay: Payer: 59

## 2023-05-23 ENCOUNTER — Ambulatory Visit: Payer: 59 | Attending: Plastic Surgery | Admitting: Rehabilitation

## 2023-05-23 ENCOUNTER — Inpatient Hospital Stay: Payer: 59 | Attending: Hematology and Oncology

## 2023-05-23 VITALS — BP 129/72 | HR 80 | Temp 98.0°F | Resp 17 | Wt 185.5 lb

## 2023-05-23 DIAGNOSIS — M25612 Stiffness of left shoulder, not elsewhere classified: Secondary | ICD-10-CM | POA: Insufficient documentation

## 2023-05-23 DIAGNOSIS — Z803 Family history of malignant neoplasm of breast: Secondary | ICD-10-CM | POA: Insufficient documentation

## 2023-05-23 DIAGNOSIS — Z79811 Long term (current) use of aromatase inhibitors: Secondary | ICD-10-CM | POA: Insufficient documentation

## 2023-05-23 DIAGNOSIS — C50912 Malignant neoplasm of unspecified site of left female breast: Secondary | ICD-10-CM | POA: Diagnosis not present

## 2023-05-23 DIAGNOSIS — Z9013 Acquired absence of bilateral breasts and nipples: Secondary | ICD-10-CM | POA: Diagnosis not present

## 2023-05-23 DIAGNOSIS — M25611 Stiffness of right shoulder, not elsewhere classified: Secondary | ICD-10-CM | POA: Insufficient documentation

## 2023-05-23 DIAGNOSIS — C50412 Malignant neoplasm of upper-outer quadrant of left female breast: Secondary | ICD-10-CM | POA: Insufficient documentation

## 2023-05-23 DIAGNOSIS — Z171 Estrogen receptor negative status [ER-]: Secondary | ICD-10-CM | POA: Diagnosis not present

## 2023-05-23 DIAGNOSIS — Z9189 Other specified personal risk factors, not elsewhere classified: Secondary | ICD-10-CM | POA: Insufficient documentation

## 2023-05-23 DIAGNOSIS — R519 Headache, unspecified: Secondary | ICD-10-CM | POA: Insufficient documentation

## 2023-05-23 DIAGNOSIS — Z923 Personal history of irradiation: Secondary | ICD-10-CM | POA: Insufficient documentation

## 2023-05-23 DIAGNOSIS — Z483 Aftercare following surgery for neoplasm: Secondary | ICD-10-CM | POA: Insufficient documentation

## 2023-05-23 DIAGNOSIS — Z1509 Genetic susceptibility to other malignant neoplasm: Secondary | ICD-10-CM | POA: Insufficient documentation

## 2023-05-23 DIAGNOSIS — C50911 Malignant neoplasm of unspecified site of right female breast: Secondary | ICD-10-CM | POA: Diagnosis not present

## 2023-05-23 MED ORDER — ACETAMINOPHEN 325 MG PO TABS
650.0000 mg | ORAL_TABLET | Freq: Once | ORAL | Status: AC
  Administered 2023-05-23: 650 mg via ORAL
  Filled 2023-05-23: qty 2

## 2023-05-23 MED ORDER — TRASTUZUMAB-HYALURONIDASE-OYSK 600-10000 MG-UNT/5ML ~~LOC~~ SOLN
600.0000 mg | Freq: Once | SUBCUTANEOUS | Status: AC
  Administered 2023-05-23: 600 mg via SUBCUTANEOUS
  Filled 2023-05-23: qty 5

## 2023-05-23 MED ORDER — DIPHENHYDRAMINE HCL 25 MG PO CAPS
25.0000 mg | ORAL_CAPSULE | Freq: Once | ORAL | Status: AC
  Administered 2023-05-23: 25 mg via ORAL
  Filled 2023-05-23: qty 1

## 2023-05-23 NOTE — Therapy (Deleted)
Patient Details  Name: Jordan Willis MRN: [id number] Date of Birth: 04-21-1951 Referring Provider:  Santiago Glad, MD  Encounter Date: 05/23/2023   OUTPATIENT PHYSICAL THERAPY ONCOLOGY TREATMENT  Patient Name: Jordan Willis MRN: [id number] DOB:14-Apr-1951, 72 y.o., female Today's Date: 05/23/2023  END OF SESSION:     Past Medical History:  Diagnosis Date   AICD (automatic cardioverter/defibrillator) present    Anemia    Anginal pain (HCC)    Anxiety    Arthritis    Lumbar spine DDD.   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   Diabetes mellitus without complication (HCC)    Type II   Dyspnea    When patient gets sick uses Pro-Air inhaler   Frequent sinus infections    GERD (gastroesophageal reflux disease)    occ   Goiter    History of cardiomegaly    History of cataract    Bilateral   History of kidney stones    08/28/2018 currently has a large kidney stone, asymptomatic at this time   History of migraine    History of uterine fibroid    History of uterine prolapse    Hyperlipidemia    Hypertension    LBBB (left bundle branch block)    Menopause 03/03/2012   Nasal turbinate hypertrophy 11/29/2016   Left inferior    Personal history of chemotherapy    Personal history of radiation therapy    Pneumonia    as a child   PONV (postoperative nausea and vomiting)    Sinusitis 2014   Sleep apnea    No CPAP   SUI (stress urinary incontinence, female)    SVD (spontaneous vaginal delivery)    x 2   Thyroid nodule    Past Surgical History:  Procedure Laterality Date   ABDOMINAL HYSTERECTOMY     BILATERAL SALPINGOOPHORECTOMY Bilateral    BIV ICD INSERTION CRT-D N/A 08/24/2022   Procedure: BIV ICD INSERTION CRT-D;  Surgeon: Marinus Maw, MD;  Location: [facility] INVASIVE CV LAB;  Service:  Cardiovascular;  Laterality: N/A;   BLADDER SUSPENSION N/A 05/16/2017   Procedure: TRANSVAGINAL TAPE (TVT) PROCEDURE;  Surgeon: Osborn Coho, MD;  Location: [facility] ORS;  Service: Gynecology;  Laterality: N/A;   BREAST BIOPSY Right 2009   BREAST IMPLANT REMOVAL Bilateral 02/05/2022   Procedure: Removal bilateral breast implants;  Surgeon: Allena Napoleon, MD;  Location: [facility] OR;  Service: Plastics;  Laterality: Bilateral;   BREAST LUMPECTOMY Right 2009   BREAST LUMPECTOMY Left 2006   BREAST RECONSTRUCTION WITH PLACEMENT OF TISSUE EXPANDER AND FLEX HD (ACELLULAR HYDRATED DERMIS) Bilateral 09/04/2021   Procedure: BILATERAL BREAST RECONSTRUCTION WITH PLACEMENT OF TISSUE EXPANDER AND FLEX HD (ACELLULAR HYDRATED DERMIS);  Surgeon: Allena Napoleon, MD;  Location: [facility] OR;  Service: Plastics;  Laterality: Bilateral;   CARDIAC CATHETERIZATION     CATARACT EXTRACTION Left 10/23/2017   CATARACT EXTRACTION Right 11/06/2017   COLONOSCOPY  10/22/2009   Normal.  Repeat 5 years.  [name]   COLONOSCOPY WITH PROPOFOL N/A 12/21/2022   Procedure: COLONOSCOPY WITH PROPOFOL;  Surgeon: Jeani Hawking, MD;  Location: [facility]OS[facility];  Service: Gastroenterology;  Laterality: N/A;   CYSTOSCOPY N/A 05/16/2017   Procedure: CYSTOSCOPY;  Surgeon: Osborn Coho, MD;  Location: [facility] ORS;  Service: Gynecology;  Laterality: N/A;  DEBRIDEMENT AND CLOSURE WOUND Bilateral 10/03/2021   Procedure: Debridement bilateral mastectomy flaps;  Surgeon: Allena Napoleon, MD;  Location: [facility] OR;  Service: Plastics;  Laterality: Bilateral;  1.5 hour   EYE SURGERY     bilateral cataracts   LEFT HEART CATH AND CORONARY ANGIOGRAPHY N/A 02/09/2020   Procedure: LEFT HEART CATH AND CORONARY ANGIOGRAPHY;  Surgeon: Elder Negus, MD;  Location: [facility] INVASIVE CV LAB;  Service: Cardiovascular;  Laterality: N/A;   LEFT HEART CATH AND CORONARY ANGIOGRAPHY N/A 03/27/2022   Procedure: LEFT HEART CATH AND CORONARY ANGIOGRAPHY;  Surgeon: Yates Decamp, MD;   Location: [facility] INVASIVE CV LAB;  Service: Cardiovascular;  Laterality: N/A;   MASS EXCISION Right 12/04/2022   Procedure: EXCISION OF RIGHT CHEST WALL MASS;  Surgeon: Almond Lint, MD;  Location: [facility] OR;  Service: General;  Laterality: Right;   MASTECTOMY W/ SENTINEL NODE BIOPSY Right 09/04/2021   Procedure: BILATERAL MASTECTOMIES WITH RIGHT SENTINEL LYMPH NODE BIOPSY;  Surgeon: Almond Lint, MD;  Location: [facility] OR;  Service: General;  Laterality: Right;   REMOVAL OF BILATERAL TISSUE EXPANDERS WITH PLACEMENT OF BILATERAL BREAST IMPLANTS Bilateral 10/03/2021   Procedure: REMOVAL OF BILATERAL TISSUE EXPANDERS WITH REPLACEMENT OF TISSUE EXPANDERS;  Surgeon: Allena Napoleon, MD;  Location: [facility] OR;  Service: Plastics;  Laterality: Bilateral;   REMOVAL OF BILATERAL TISSUE EXPANDERS WITH PLACEMENT OF BILATERAL BREAST IMPLANTS Bilateral 12/19/2021   Procedure: REMOVAL OF BILATERAL TISSUE EXPANDERS WITH PLACEMENT OF BILATERAL BREAST IMPLANTS;  Surgeon: Allena Napoleon, MD;  Location: [facility] OR;  Service: Plastics;  Laterality: Bilateral;   ROBOTIC ASSISTED TOTAL HYSTERECTOMY N/A 05/16/2017   Procedure: ROBOTIC ASSISTED TOTAL HYSTERECTOMY;  Surgeon: Silverio Lay, MD;  Location: [facility] ORS;  Service: Gynecology;  Laterality: N/A;   ROTATOR CUFF REPAIR Bilateral    SCAR REVISION N/A 12/04/2022   Procedure: REVISION OF MASTECTOMY SCAR;  Surgeon: Almond Lint, MD;  Location: [facility] OR;  Service: General;  Laterality: N/A;   SHOULDER ARTHROSCOPY WITH ROTATOR CUFF REPAIR AND SUBACROMIAL DECOMPRESSION Right 09/03/2018   Procedure: Right shoulder manipulation under anesthesia, exam under anesthesia, mini open rotator cuff repair, subacromial decompression;  Surgeon: Jene Every, MD;  Location: [facility] ORS;  Service: Orthopedics;  Laterality: Right;  90 mins   TUBAL LIGATION     WISDOM TOOTH EXTRACTION     Patient Active Problem List   Diagnosis Date Noted   ICD (implantable cardioverter-defibrillator) in place 12/25/2022    Bradycardia 08/24/2022   BRCA2 gene mutation positive in female 07/30/2022   LBBB (left bundle branch block)    Chronic diastolic heart failure (HCC)    NICM (nonischemic cardiomyopathy) (HCC)    Breast cancer of upper-outer quadrant of right female breast (HCC) 09/04/2021   Diabetic neuropathy (HCC) 02/08/2021   Angina pectoris (HCC) 02/09/2020   Abnormal stress test 02/09/2020   Complete rotator cuff tear 09/03/2018   Pelvic prolapse 05/16/2017   Intractable chronic post-traumatic headache 11/19/2016   Malignant neoplasm of upper-outer quadrant of left breast in female, estrogen receptor negative (HCC) 03/03/2012   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  ONSET DATE:  February 2024   Rationale for Evaluation and Treatment: Rehabilitation  SUBJECTIVE:  SUBJECTIVE STATEMENT:    PERTINENT HISTORY: Pt was diagnosed with right breast cancer dx 05/2021. She has BRCA2 genetic mutation and has had left breast cancer in 2006 with chemo/breast conservation and XRT. She then had right breast cancer (dcis) with lumpectomy and radiation only. Dr. Lurene Shadow was her surgeon. She presented this time with a palpable painful mass in the upper outer right breast. Dx imaging showed a 1.2 cm mass at 10 o'clock. 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 The cardiomyopathy recovered, but the neuropathy did not. Her sister and mother have both had breast cancer. Pt underwent bilateral  mastectomies with right sentinel node biopsy and expander reconstruction (Pace) 09/04/21. Final path at that time was [id number] with 6 negative nodes and negative margins. Patient ended up having the expanders removed because of continued drainage and discomfort.  2.14.2024 : revision of left mastectomy scar and removal of mass on right lateral mastectomy scar. She was found to have a nodule there and diagnostic imaging was performed. Ultrasound showed a 1.3 cm irregular mass at 9:00. Core needle biopsy was performed and this demonstrated invasive ductal carcinoma, grade 3. This was weakly ER positive, PR negative, HER2 positive, Ki-67 70%. She is currently undergoing chemotherapy   Pt also has a pacemaker  and defibrillator.  She has a decrease ejection fraction of 30% likely exacerbated by the chemotherapy.    Review of Systems:  PAIN:   PAIN:  Are you having pain? Yes NPRS scale: 2-3/10 under arm Pain location: right axilla Pain orientation: Right  PAIN TYPE: tight and hurt Pain description: intermittent  Aggravating factors: Stretching arm Relieving factors: rest She feels that she has tightness in her neck from stress  She rates the tightness across her chest to shoulders at 5-6/10   PRECAUTIONS: Fall and Other: at risk for lymphedema, cardiac precautions   WEIGHT BEARING RESTRICTIONS: No  FALLS:  Has patient fallen in last 6 months? No  LIVING ENVIRONMENT: Lives with: lives with their family OCCUPATION: does not work   LEISURE: wants to go to [employer] she can only walk one block before she fatigues   HAND DOMINANCE: right   PRIOR LEVEL OF FUNCTION: Independent though limited by fatigue   PATIENT GOALS: to get things back on the top shelf of her cabinets    OBJECTIVE:  COGNITION: Overall cognitive status: Within functional limits for tasks assessed   PALPATION: Tight limited skin excursion across both sides of her chest with limited scar mobility    OBSERVATIONS / OTHER ASSESSMENTS: pt with visible tightness at chest with contraction of scars and fullness above scars    POSTURE: forward head rounded shoulders   UPPER EXTREMITY AROM/PROM:  A/PROM RIGHT   eval  RIGHT 05/09/2023  Shoulder extension 20 46  Shoulder flexion 110 138  Shoulder abduction 90 130  Shoulder internal rotation 20 with pain  40  Shoulder external rotation 90 97    (Blank rows = not tested)  A/PROM LEFT   eval LEFT 05/09/2023  Shoulder extension 20 45  Shoulder flexion 120 135  Shoulder abduction 95 130  Shoulder internal rotation 5 with pain  40  Shoulder external rotation 90 102    (Blank rows = not tested)  UPPER EXTREMITY STRENGTH: less that 3/5 as pt is not able to lift arms to end range of her available range of motion, diffuse muscle atrophy visible  LYMPHEDEMA ASSESSMENTS:  SURGERY TYPE/DATE: multiple surgeries 2006, 2022, 2024 NUMBER OF LYMPH NODES REMOVED: 6 CHEMOTHERAPY: yes, ongoing  RADIATION:yes HORMONE TREATMENT: yes  INFECTIONS: no  LYMPHEDEMA ASSESSMENTS:   LANDMARK RIGHT  eval  10 cm proximal to olecranon process 35  Olecranon process 28  10 cm proximal to ulnar styloid process 22.7  Just proximal to ulnar styloid process 17.5  Across hand at thumb web space 20  At base of 2nd digit 6.5  (Blank rows = not tested)  LANDMARK LEFT  eval  10 cm proximal to olecranon process 35  Olecranon process 27.5  10 cm proximal to ulnar styloid process 21.5  Just proximal to ulnar styloid process 17.5  Across hand at thumb web space 20  At base of 2nd digit 6.5  (Blank rows = not tested)  FUNCTIONAL TESTS:  6 reps in  30 seconds chair stand test  " pt very slow   QUICK DASH SURVEY: 45.45  TODAY'S TREATMENT:                                                                                                                                         DATE:   05/09/2023 STM to bil lateral chest wall/trunk/UTover palpably tight  musculature being mindful of pacemaker on Rt chest, and to right UT/scapular area in SL P/ROM in supine to bil shoulders into flex, scaption and D2 , and ER with scapular depression throughout Supine bilateral AROM flexion, scaption, ABD Measured AROM Jobes flexion x 5   05/06/2023 Pulleys x 2 min flex and abd Ball rolls on wall x 10 Supine bilateral UE AROM flexion, scaption, horizntal abd x 5 STM to bil lateral chest wall/trunk/UTover palpably tight musculature being mindful of pacemaker on Rt chest, and to right UT/scapular area in SL MFR to left chest region and right  chest avoiding pacemaker Supine wand flexion and scaption x 5 P/ROM in supine to bil shoulders into flex, scaption and D2 , and ER with scapular depression throughout  05/01/23 STM to bil lateral chest wall/trunk/UTover palpably tight musculature being mindful of pacemaker on Rt chest, and to right UT/scapular area in SL Supine wand flexion and scaption x 5 P/ROM in supine to bil shoulders into flex, scaption and D2 , and ER with scapular depression throughout  04/29/23: Therapeutic Exercises Pulleys into flex and scaption x 2 mins each Roll yellow ball up wall into flexion x 10 and bil abd x 5 each returning therapist demo Supine over half foam for following: Bil UE horz abd, bil UE scaption into a "V" x 10 each; and then bil abd "snow angel" x 5, 5 sec holds  Manual Therapy P/ROM in supine to bil shoulders into flex, scaption and D2 with scapular depression throughout STM to bil lateral chest wall/trunk over palpably tight musculature being mindful of pacemaker on Rt chest  04/18/23: Manual Therapy P/ROM in supine to bil shoulders  into flex, scaption and D2 with scapular depression throughout STM to bil lateral chest wall/trunk over palpably tight musculature Therapeutic Exercises Pulleys into flex and scaption x 2 mins each returning therapist demo and VC's during to decr scapular compensation Lt UE NTS on wall as  pt reports this UE tingles when she tries to lay on it at night; encouraged to do this only about 5 reps at a time and to not overstretch the nerve.  Roll yellow ball on wall for flexion x 10 returning therapist demo and VC's with demo to decr scap compensation throughout. Pt able to do this with cuing   04/15/23: In supine PROM to bilateral shoulders in to flexion, abduction and ER with increased tightness bilaterally and discomfort. Pt guarded throughout. At end of session pt had a spasm in her R pec that eased with STM and stretching. Instructed pt in seated pec stretch and encouraged her to try doorway or corner stretch at home as long as no sharp pain  04/09/2023 evaluation   PATIENT EDUCATION:  Education details: goals of therapy  Person educated: Patient Education method: Explanation Education comprehension: verbalized understanding  HOME EXERCISE PROGRAM: Pt encouraged to do the broomstick exercises and shoulder exercises she has previously been doing  Doorway or corner stretch  ASSESSMENT:  CLINICAL IMPRESSION: Pt has not had a spasm on the right side in several days. AROM greatly improved when measured after treatment however, pt is still weak and is not able to actively go to end ranges in standing.  OBJECTIVE IMPAIRMENTS: cardiopulmonary status limiting activity, decreased activity tolerance, decreased endurance, decreased ROM, decreased strength, increased fascial restrictions, impaired perceived functional ability, impaired UE functional use, and postural dysfunction.   ACTIVITY LIMITATIONS: carrying, lifting, standing, reach over head, and locomotion level  PARTICIPATION LIMITATIONS: meal prep, cleaning, laundry, and yard work  PERSONAL FACTORS: Age, Past/current experiences, and 3+ comorbidities: previous surgery, decreased cardiac status, onling chemotherapy   are also affecting patient's functional outcome.   REHAB POTENTIAL: Good  CLINICAL DECISION MAKING:  Stable/uncomplicated  EVALUATION COMPLEXITY: Low  GOALS: Goals reviewed with patient? Yes  SHORT TERM GOALS: Target date: 05/09/2023  Pt will be independent in a basic exercise program for shoulder ROM and Strenth  Baseline:needs review Goal status: INITIAL  LONG TERM GOALS: Target date: 8/18.2024  Pt will decrease quick DASH score to < 20 indicating an improvement in functional use of her UE's Baseline: 45.45 Goal status: INITIAL  2.  Pt will have increased shoulder flextion of >  135 degrees in both shoulders so that she can use her arms better at home  Baseline: 110 on right and 120 on left  Goal status: INITIAL  3.  Pt will report that she can put things on the top shelf of her cabinets at home without using a stepstool  Baseline: unable  Goal status: INITIAL  4.  Pt will increase to 10 reps of sit to stand in 30 seconds indicating an improvement in activity tolerance and functional strength  Baseline: 6 Goal status: INITIAL  PLAN:  PT FREQUENCY: 1-2x/week  PT DURATION: 8 weeks  PLANNED INTERVENTIONS: Therapeutic exercises, Therapeutic activity, Neuromuscular re-education, Balance training, Gait training, Patient/Family education, Self Care, Joint mobilization, scar mobilization, and Manual therapy  PLAN FOR NEXT SESSION: Cont manual work for soft tissue and scar mobilization, passive and A/AA/ROM excise for shoulders; add strength,add nustep and functional endurance exercise   Idamae Lusher, PT 05/23/2023, 1:00 PM   [facility]  Specialty Rehab [address] [place], [place], [address] Phone: [phone]   Fax:  [phone]

## 2023-05-23 NOTE — Patient Instructions (Signed)
[facility]  Discharge Instructions: Thank you for choosing [facility] to provide your oncology and hematology care.   If you have a lab appointment with the Cancer Center, please go directly to the Cancer Center and check in at the registration area.   Wear comfortable clothing and clothing appropriate for easy access to any Portacath or PICC line.   We strive to give you quality time with your provider. You may need to reschedule your appointment if you arrive late (15 or more minutes).  Arriving late affects you and other patients whose appointments are after yours.  Also, if you miss three or more appointments without notifying the office, you may be dismissed from the clinic at the provider's discretion.      For prescription refill requests, have your pharmacy contact our office and allow 72 hours for refills to be completed.    Today you received the following chemotherapy and/or immunotherapy agents: Herceptin Hylecta      To help prevent nausea and vomiting after your treatment, we encourage you to take your nausea medication as directed.  BELOW ARE SYMPTOMS THAT SHOULD BE REPORTED IMMEDIATELY: *FEVER GREATER THAN 100.4 F (38 C) OR HIGHER *CHILLS OR SWEATING *NAUSEA AND VOMITING THAT IS NOT CONTROLLED WITH YOUR NAUSEA MEDICATION *UNUSUAL SHORTNESS OF BREATH *UNUSUAL BRUISING OR BLEEDING *URINARY PROBLEMS (pain or burning when urinating, or frequent urination) *BOWEL PROBLEMS (unusual diarrhea, constipation, pain near the anus) TENDERNESS IN MOUTH AND THROAT WITH OR WITHOUT PRESENCE OF ULCERS (sore throat, sores in mouth, or a toothache) UNUSUAL RASH, SWELLING OR PAIN  UNUSUAL VAGINAL DISCHARGE OR ITCHING   Items with * indicate a potential emergency and should be followed up as soon as possible or go to the Emergency Department if any problems should occur.  Please show the CHEMOTHERAPY ALERT CARD or IMMUNOTHERAPY ALERT CARD  at check-in to the Emergency Department and triage nurse.  Should you have questions after your visit or need to cancel or reschedule your appointment, please contact [facility]  Dept: [phone]  and follow the prompts.  Office hours are 8:00 a.m. to 4:30 p.m. Monday - Friday. Please note that voicemails left after 4:00 p.m. may not be returned until the following business day.  We are closed weekends and major holidays. You have access to a nurse at all times for urgent questions. Please call the main number to the clinic Dept: [phone] and follow the prompts.   For any non-urgent questions, you may also contact your provider using MyChart. We now offer e-Visits for anyone 18 and older to request care online for non-urgent symptoms. For details visit [email or web address].   Also download the MyChart app! Go to the app store, search "MyChart", open the app, select [facility], and log in with your MyChart username and password.   

## 2023-05-28 ENCOUNTER — Ambulatory Visit (INDEPENDENT_AMBULATORY_CARE_PROVIDER_SITE_OTHER): Payer: 59

## 2023-05-28 DIAGNOSIS — I428 Other cardiomyopathies: Secondary | ICD-10-CM

## 2023-05-28 DIAGNOSIS — I447 Left bundle-branch block, unspecified: Secondary | ICD-10-CM

## 2023-05-29 ENCOUNTER — Ambulatory Visit: Payer: 59

## 2023-05-29 DIAGNOSIS — R051 Acute cough: Secondary | ICD-10-CM | POA: Diagnosis not present

## 2023-05-29 DIAGNOSIS — M25612 Stiffness of left shoulder, not elsewhere classified: Secondary | ICD-10-CM

## 2023-05-29 DIAGNOSIS — M25611 Stiffness of right shoulder, not elsewhere classified: Secondary | ICD-10-CM | POA: Diagnosis not present

## 2023-05-29 DIAGNOSIS — Z9189 Other specified personal risk factors, not elsewhere classified: Secondary | ICD-10-CM | POA: Diagnosis not present

## 2023-05-29 DIAGNOSIS — Z483 Aftercare following surgery for neoplasm: Secondary | ICD-10-CM | POA: Diagnosis not present

## 2023-05-29 DIAGNOSIS — R0981 Nasal congestion: Secondary | ICD-10-CM | POA: Diagnosis not present

## 2023-05-29 DIAGNOSIS — H6691 Otitis media, unspecified, right ear: Secondary | ICD-10-CM | POA: Diagnosis not present

## 2023-05-29 DIAGNOSIS — R509 Fever, unspecified: Secondary | ICD-10-CM | POA: Diagnosis not present

## 2023-05-29 DIAGNOSIS — Z9013 Acquired absence of bilateral breasts and nipples: Secondary | ICD-10-CM | POA: Diagnosis not present

## 2023-05-29 DIAGNOSIS — M791 Myalgia, unspecified site: Secondary | ICD-10-CM | POA: Diagnosis not present

## 2023-05-29 NOTE — Therapy (Signed)
Patient Details  Name: Jordan Willis MRN: [id number] Date of Birth: January 14, 1951 Referring Provider:  Santiago Glad, MD  Encounter Date: 05/29/2023   OUTPATIENT PHYSICAL THERAPY ONCOLOGY TREATMENT  Patient Name: Jordan Willis MRN: [id number] DOB:[birth year], 72 y.o., female Today's Date: 05/29/2023  END OF SESSION:  PT End of Session - 05/29/23 0910     Visit Number 8    Number of Visits 17    Date for PT Re-Evaluation 06/10/23    PT Start Time 0907    PT Stop Time 1001    PT Time Calculation (min) 54 min    Activity Tolerance Patient tolerated treatment well    Behavior During Therapy [facility] for tasks assessed/performed               Past Medical History:  Diagnosis Date   AICD (automatic cardioverter/defibrillator) present    Anemia    Anginal pain (HCC)    Anxiety    Arthritis    Lumbar spine DDD.   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   Diabetes mellitus without complication (HCC)    Type II   Dyspnea    When patient gets sick uses Pro-Air inhaler   Frequent sinus infections    GERD (gastroesophageal reflux disease)    occ   Goiter    History of cardiomegaly    History of cataract    Bilateral   History of kidney stones    08/28/2018 currently has a large kidney stone, asymptomatic at this time   History of migraine    History of uterine fibroid    History of uterine prolapse    Hyperlipidemia    Hypertension    LBBB (left bundle branch block)    Menopause 03/03/2012   Nasal turbinate hypertrophy 11/29/2016   Left inferior    Personal history of chemotherapy    Personal history of radiation therapy    Pneumonia    as a child   PONV (postoperative nausea and vomiting)    Sinusitis 2014   Sleep apnea    No CPAP   SUI (stress urinary incontinence, female)    SVD  (spontaneous vaginal delivery)    x 2   Thyroid nodule    Past Surgical History:  Procedure Laterality Date   ABDOMINAL HYSTERECTOMY     BILATERAL SALPINGOOPHORECTOMY Bilateral    BIV ICD INSERTION CRT-D N/A 08/24/2022   Procedure: BIV ICD INSERTION CRT-D;  Surgeon: Marinus Maw, MD;  Location: [facility] INVASIVE CV LAB;  Service: Cardiovascular;  Laterality: N/A;   BLADDER SUSPENSION N/A 05/16/2017   Procedure: TRANSVAGINAL TAPE (TVT) PROCEDURE;  Surgeon: Osborn Coho, MD;  Location: [facility] ORS;  Service: Gynecology;  Laterality: N/A;   BREAST BIOPSY Right 2009   BREAST IMPLANT REMOVAL Bilateral 02/05/2022   Procedure: Removal bilateral breast implants;  Surgeon: Allena Napoleon, MD;  Location: [facility] OR;  Service: Plastics;  Laterality: Bilateral;   BREAST LUMPECTOMY Right 2009   BREAST LUMPECTOMY Left 2006   BREAST RECONSTRUCTION WITH PLACEMENT OF TISSUE EXPANDER AND FLEX HD (ACELLULAR HYDRATED DERMIS) Bilateral 09/04/2021   Procedure: BILATERAL BREAST RECONSTRUCTION WITH PLACEMENT OF TISSUE EXPANDER AND FLEX HD (ACELLULAR HYDRATED DERMIS);  Surgeon: Allena Napoleon, MD;  Location: [facility] OR;  Service: Plastics;  Laterality: Bilateral;   CARDIAC CATHETERIZATION  CATARACT EXTRACTION Left 10/23/2017   CATARACT EXTRACTION Right 11/06/2017   COLONOSCOPY  10/22/2009   Normal.  Repeat 5 years.  [name]   COLONOSCOPY WITH PROPOFOL N/A 12/21/2022   Procedure: COLONOSCOPY WITH PROPOFOL;  Surgeon: Jeani Hawking, MD;  Location: [facility] ENDOSCOPY;  Service: Gastroenterology;  Laterality: N/A;   CYSTOSCOPY N/A 05/16/2017   Procedure: CYSTOSCOPY;  Surgeon: Osborn Coho, MD;  Location: [facility] ORS;  Service: Gynecology;  Laterality: N/A;   DEBRIDEMENT AND CLOSURE WOUND Bilateral 10/03/2021   Procedure: Debridement bilateral mastectomy flaps;  Surgeon: Allena Napoleon, MD;  Location: [facility] OR;  Service: Plastics;  Laterality: Bilateral;  1.5 hour   EYE SURGERY     bilateral cataracts   LEFT HEART CATH AND CORONARY  ANGIOGRAPHY N/A 02/09/2020   Procedure: LEFT HEART CATH AND CORONARY ANGIOGRAPHY;  Surgeon: Elder Negus, MD;  Location: [facility] INVASIVE CV LAB;  Service: Cardiovascular;  Laterality: N/A;   LEFT HEART CATH AND CORONARY ANGIOGRAPHY N/A 03/27/2022   Procedure: LEFT HEART CATH AND CORONARY ANGIOGRAPHY;  Surgeon: Yates Decamp, MD;  Location: [facility] INVASIVE CV LAB;  Service: Cardiovascular;  Laterality: N/A;   MASS EXCISION Right 12/04/2022   Procedure: EXCISION OF RIGHT CHEST WALL MASS;  Surgeon: Almond Lint, MD;  Location: [facility] OR;  Service: General;  Laterality: Right;   MASTECTOMY W/ SENTINEL NODE BIOPSY Right 09/04/2021   Procedure: BILATERAL MASTECTOMIES WITH RIGHT SENTINEL LYMPH NODE BIOPSY;  Surgeon: Almond Lint, MD;  Location: [facility] OR;  Service: General;  Laterality: Right;   REMOVAL OF BILATERAL TISSUE EXPANDERS WITH PLACEMENT OF BILATERAL BREAST IMPLANTS Bilateral 10/03/2021   Procedure: REMOVAL OF BILATERAL TISSUE EXPANDERS WITH REPLACEMENT OF TISSUE EXPANDERS;  Surgeon: Allena Napoleon, MD;  Location: [facility] OR;  Service: Plastics;  Laterality: Bilateral;   REMOVAL OF BILATERAL TISSUE EXPANDERS WITH PLACEMENT OF BILATERAL BREAST IMPLANTS Bilateral 12/19/2021   Procedure: REMOVAL OF BILATERAL TISSUE EXPANDERS WITH PLACEMENT OF BILATERAL BREAST IMPLANTS;  Surgeon: Allena Napoleon, MD;  Location: [facility] OR;  Service: Plastics;  Laterality: Bilateral;   ROBOTIC ASSISTED TOTAL HYSTERECTOMY N/A 05/16/2017   Procedure: ROBOTIC ASSISTED TOTAL HYSTERECTOMY;  Surgeon: Silverio Lay, MD;  Location: [facility] ORS;  Service: Gynecology;  Laterality: N/A;   ROTATOR CUFF REPAIR Bilateral    SCAR REVISION N/A 12/04/2022   Procedure: REVISION OF MASTECTOMY SCAR;  Surgeon: Almond Lint, MD;  Location: [facility] OR;  Service: General;  Laterality: N/A;   SHOULDER ARTHROSCOPY WITH ROTATOR CUFF REPAIR AND SUBACROMIAL DECOMPRESSION Right 09/03/2018   Procedure: Right shoulder manipulation under anesthesia, exam under anesthesia, mini  open rotator cuff repair, subacromial decompression;  Surgeon: Jene Every, MD;  Location: [facility] ORS;  Service: Orthopedics;  Laterality: Right;  90 mins   TUBAL LIGATION     WISDOM TOOTH EXTRACTION     Patient Active Problem List   Diagnosis Date Noted   ICD (implantable cardioverter-defibrillator) in place 12/25/2022   Bradycardia 08/24/2022   BRCA2 gene mutation positive in female 07/30/2022   LBBB (left bundle branch block)    Chronic diastolic heart failure (HCC)    NICM (nonischemic cardiomyopathy) (HCC)    Breast cancer of upper-outer quadrant of right female breast (HCC) 09/04/2021   Diabetic neuropathy (HCC) 02/08/2021   Angina pectoris (HCC) 02/09/2020   Abnormal stress test 02/09/2020   Complete rotator cuff tear 09/03/2018   Pelvic prolapse 05/16/2017   Intractable chronic post-traumatic headache 11/19/2016   Malignant neoplasm of upper-outer quadrant of left breast in female, estrogen receptor negative (HCC) 03/03/2012  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   Menopause 03/03/2012    PCP: Merri Brunette, MD  REFERRING PROVIDER: Santiago Glad, MD  REFERRING DIAG: breast cancer, stiffness in shoulders  Z98.890 (ICD-10-CM) - S/P scar revision THERAPY DIAG:  History of bilateral mastectomy  Aftercare following surgery for neoplasm  At risk for lymphedema  Stiffness of left shoulder, not elsewhere classified  Stiffness of right shoulder, not elsewhere classified  ONSET DATE:  February 2024   Rationale for Evaluation and Treatment: Rehabilitation  SUBJECTIVE:                                                                                                                                                                                           SUBJECTIVE STATEMENT:  I lifted a case of water 2 days ago and I think it was a bit heavy because my Lt chest bothered me all day after that. I had to take pain meds and I rested yesterday. It feels  fine today.   PERTINENT HISTORY: Pt was diagnosed with right breast cancer dx 05/2021. She has BRCA2 genetic mutation and has had left breast cancer in 2006 with chemo/breast conservation and XRT. She then had right breast cancer (dcis) with lumpectomy and radiation only. Dr. Lurene Shadow was her surgeon. She presented this time with a palpable painful mass in the upper outer right breast. Dx imaging showed a 1.2 cm mass at 10 o'clock. 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 The cardiomyopathy recovered, but the neuropathy did not. Her sister and mother have both had breast cancer. Pt underwent bilateral mastectomies with right sentinel node biopsy and expander reconstruction (Pace) 09/04/21. Final path at that time was [id number] with 6 negative nodes and negative margins. Patient ended up having the expanders removed because of continued drainage and discomfort.  2.14.2024 : revision of left mastectomy scar and removal of mass on right lateral mastectomy scar. She was found to have a nodule there and diagnostic imaging was performed. Ultrasound showed a 1.3 cm irregular mass at 9:00. Core needle biopsy was performed and this demonstrated invasive ductal carcinoma, grade 3. This was weakly ER positive, PR negative, HER2 positive, Ki-67 70%. She is currently undergoing chemotherapy   Pt also has a pacemaker  and defibrillator.  She has a decrease ejection fraction of 30% likely exacerbated by the chemotherapy.    Review of Systems:  PAIN:   PAIN:  Are you having pain? No   PRECAUTIONS: Fall and Other: at risk for lymphedema, cardiac precautions   WEIGHT  BEARING RESTRICTIONS: No  FALLS:  Has patient fallen in last 6 months? No  LIVING ENVIRONMENT: Lives with: lives with their family OCCUPATION: does not work   LEISURE: wants to go to [employer] she can only walk one block before she fatigues    HAND DOMINANCE: right   PRIOR LEVEL OF FUNCTION: Independent though limited by fatigue   PATIENT GOALS: to get things back on the top shelf of her cabinets    OBJECTIVE:  COGNITION: Overall cognitive status: Within functional limits for tasks assessed   PALPATION: Tight limited skin excursion across both sides of her chest with limited scar mobility   OBSERVATIONS / OTHER ASSESSMENTS: pt with visible tightness at chest with contraction of scars and fullness above scars    POSTURE: forward head rounded shoulders   UPPER EXTREMITY AROM/PROM:  A/PROM RIGHT   eval  RIGHT 05/09/2023  Shoulder extension 20 46  Shoulder flexion 110 138  Shoulder abduction 90 130  Shoulder internal rotation 20 with pain  40  Shoulder external rotation 90 97    (Blank rows = not tested)  A/PROM LEFT   eval LEFT 05/09/2023  Shoulder extension 20 45  Shoulder flexion 120 135  Shoulder abduction 95 130  Shoulder internal rotation 5 with pain  40  Shoulder external rotation 90 102    (Blank rows = not tested)  UPPER EXTREMITY STRENGTH: less that 3/5 as pt is not able to lift arms to end range of her available range of motion, diffuse muscle atrophy visible  LYMPHEDEMA ASSESSMENTS:  SURGERY TYPE/DATE: multiple surgeries 2006, 2022, 2024 NUMBER OF LYMPH NODES REMOVED: 6 CHEMOTHERAPY: yes, ongoing  RADIATION:yes HORMONE TREATMENT: yes  INFECTIONS: no  LYMPHEDEMA ASSESSMENTS:   LANDMARK RIGHT  eval  10 cm proximal to olecranon process 35  Olecranon process 28  10 cm proximal to ulnar styloid process 22.7  Just proximal to ulnar styloid process 17.5  Across hand at thumb web space 20  At base of 2nd digit 6.5  (Blank rows = not tested)  LANDMARK LEFT  eval  10 cm proximal to olecranon process 35  Olecranon process 27.5  10 cm proximal to ulnar styloid process 21.5  Just proximal to ulnar styloid process 17.5  Across hand at thumb web space 20  At base of 2nd digit 6.5  (Blank  rows = not tested)  FUNCTIONAL TESTS:  6 reps in  30 seconds chair stand test  " pt very slow   QUICK DASH SURVEY: 45.45  TODAY'S TREATMENT:                                                                                                                                         DATE:  05/29/23: Therapeutic Exercises NuStep level 5 x 1:45, level 4 for remaining time up to 6 mins, 430 steps; pt reports being challenged by this  Standing in //  bars for bil hip 3 way raises x 10 each, pt returned therapist demo for each and VC's during for corrected LE position and to decrease trunk and pelvis compensations Supine Scapular series with red theraband x 10 each returning therapist demo for each; pt reports her arms felt tired after these so issued yellow for her to use initially at home  Manual Therapy STM to bil lateral chest wall/trunk/UT over palpably tight musculature being mindful of pacemaker on Rt chest, and to right UT/scapular area in SL P/ROM in supine to bil shoulders into flex, scaption and D2 with scapular depression throughout  05/09/2023 STM to bil lateral chest wall/trunk/UTover palpably tight musculature being mindful of pacemaker on Rt chest, and to right UT/scapular area in SL P/ROM in supine to bil shoulders into flex, scaption and D2 , and ER with scapular depression throughout Supine bilateral AROM flexion, scaption, ABD Measured AROM Jobes flexion x 5   05/06/2023 Pulleys x 2 min flex and abd Ball rolls on wall x 10 Supine bilateral UE AROM flexion, scaption, horizntal abd x 5 STM to bil lateral chest wall/trunk/UTover palpably tight musculature being mindful of pacemaker on Rt chest, and to right UT/scapular area in SL MFR to left chest region and right  chest avoiding pacemaker Supine wand flexion and scaption x 5 P/ROM in supine to bil shoulders into flex, scaption and D2 , and ER with scapular depression throughout  05/01/23 STM to bil lateral chest wall/trunk/UTover  palpably tight musculature being mindful of pacemaker on Rt chest, and to right UT/scapular area in SL Supine wand flexion and scaption x 5 P/ROM in supine to bil shoulders into flex, scaption and D2 , and ER with scapular depression throughout  04/29/23: Therapeutic Exercises Pulleys into flex and scaption x 2 mins each Roll yellow ball up wall into flexion x 10 and bil abd x 5 each returning therapist demo Supine over half foam for following: Bil UE horz abd, bil UE scaption into a "V" x 10 each; and then bil abd "snow angel" x 5, 5 sec holds  Manual Therapy P/ROM in supine to bil shoulders into flex, scaption and D2 with scapular depression throughout STM to bil lateral chest wall/trunk over palpably tight musculature being mindful of pacemaker on Rt chest  04/18/23: Manual Therapy P/ROM in supine to bil shoulders into flex, scaption and D2 with scapular depression throughout STM to bil lateral chest wall/trunk over palpably tight musculature Therapeutic Exercises Pulleys into flex and scaption x 2 mins each returning therapist demo and VC's during to decr scapular compensation Lt UE NTS on wall as pt reports this UE tingles when she tries to lay on it at night; encouraged to do this only about 5 reps at a time and to not overstretch the nerve.  Roll yellow ball on wall for flexion x 10 returning therapist demo and VC's with demo to decr scap compensation throughout. Pt able to do this with cuing   04/15/23: In supine PROM to bilateral shoulders in to flexion, abduction and ER with increased tightness bilaterally and discomfort. Pt guarded throughout. At end of session pt had a spasm in her R pec that eased with STM and stretching. Instructed pt in seated pec stretch and encouraged her to try doorway or corner stretch at home as long as no sharp pain  04/09/2023 evaluation   PATIENT EDUCATION:  Education details: Standing bil hip 3 way raises and supine scapular series Person educated:  Patient Education method: Explanation, demonstration,  verbal cues, handout issued Education comprehension: verbalized understanding, returned demo, VC's, will benefit from further review  HOME EXERCISE PROGRAM: Pt encouraged to do the broomstick exercises and shoulder exercises she has previously been doing  Doorway or corner stretch Standing bil LE hip 3 way raises and supine scapular series  ASSESSMENT:  CLINICAL IMPRESSION: Progressed pt to include standing bil LE strength and supine postural strength. She was challenged by these today and encouraged to take her time to have good technique.   OBJECTIVE IMPAIRMENTS: cardiopulmonary status limiting activity, decreased activity tolerance, decreased endurance, decreased ROM, decreased strength, increased fascial restrictions, impaired perceived functional ability, impaired UE functional use, and postural dysfunction.   ACTIVITY LIMITATIONS: carrying, lifting, standing, reach over head, and locomotion level  PARTICIPATION LIMITATIONS: meal prep, cleaning, laundry, and yard work  PERSONAL FACTORS: Age, Past/current experiences, and 3+ comorbidities: previous surgery, decreased cardiac status, onling chemotherapy   are also affecting patient's functional outcome.   REHAB POTENTIAL: Good  CLINICAL DECISION MAKING: Stable/uncomplicated  EVALUATION COMPLEXITY: Low  GOALS: Goals reviewed with patient? Yes  SHORT TERM GOALS: Target date: 05/09/2023  Pt will be independent in a basic exercise program for shoulder ROM and Strenth  Baseline:needs review Goal status: INITIAL  LONG TERM GOALS: Target date: 8/18.2024  Pt will decrease quick DASH score to < 20 indicating an improvement in functional use of her UE's Baseline: 45.45 Goal status: INITIAL  2.  Pt will have increased shoulder flextion of >  135 degrees in both shoulders so that she can use her arms better at home  Baseline: 110 on right and 120 on left  Goal status:  INITIAL  3.  Pt will report that she can put things on the top shelf of her cabinets at home without using a stepstool  Baseline: unable  Goal status: INITIAL  4.  Pt will increase to 10 reps of sit to stand in 30 seconds indicating an improvement in activity tolerance and functional strength  Baseline: 6 Goal status: INITIAL  PLAN:  PT FREQUENCY: 1-2x/week  PT DURATION: 8 weeks  PLANNED INTERVENTIONS: Therapeutic exercises, Therapeutic activity, Neuromuscular re-education, Balance training, Gait training, Patient/Family education, Self Care, Joint mobilization, scar mobilization, and Manual therapy  PLAN FOR NEXT SESSION: Cont manual work for soft tissue and scar mobilization, passive and A/AA/ROM excise for shoulders; add strength, Cont nustep and functional endurance exercise   Hermenia Bers, PTA 05/29/2023, 10:15 AM   [facility] [address] [place], [place], [address] Phone: [phone]   Fax:  [phone]         Cancer Rehab [phone] HIP: Flexion Standing    Holding onto counter lift leg straight in front keeping knee straight. Slow and controlled! _10__ reps per set, _2-3__ sets per day. Then repeat with other leg.   Hip Extension (Standing)    Stand with support at counter. Squeeze pelvic floor and hold so as not to twist hips and don't lean forward.  Move right leg backward with straight knee. Slow and controlled! Repeat _10__ times. Do _2-3__ times a day. Repeat with other leg.  Hip Abduction (Standing)    Stand with support. Squeeze pelvic floor and hold. Lift right leg out to side, keeping toe forward.  Repeat _10__ times. Do _2-3__ times a day. Repeat with other leg.    Over Head Pull: Narrow and Wide Grip   Start with yellow band for all then progress to red once this becomes easier.   On  back, knees bent, feet flat, band across thighs, elbows straight but relaxed. Pull hands apart (start).  Keeping elbows straight, bring arms up and over head, hands toward floor. Keep pull steady on band. Hold momentarily. Return slowly, keeping pull steady, back to start. Then do same with a wider grip on the band (past shoulder width) Repeat _5-10__ times. Band color __red____   Side Pull: Double Arm   On back, knees bent, feet flat. Arms perpendicular to body, shoulder level, elbows straight but relaxed. Pull arms out to sides, elbows straight. Resistance band comes across collarbones, hands toward floor. Hold momentarily. Slowly return to starting position. Repeat _5-10__ times. Band color _red____   Sword   On back, knees bent, feet flat, left hand on left hip, right hand above left. Pull right arm DIAGONALLY (hip to shoulder) across chest. Bring right arm along head toward floor. Hold momentarily. Slowly return to starting position. Repeat _5-10__ times. Do with left arm. Band color _red_____   Shoulder Rotation: Double Arm   On back, knees bent, feet flat, elbows tucked at sides, bent 90, hands palms up. Pull hands apart and down toward floor, keeping elbows near sides. Hold momentarily. Slowly return to starting position. Repeat _5-10__ times. Band color __red____

## 2023-05-30 DIAGNOSIS — C50912 Malignant neoplasm of unspecified site of left female breast: Secondary | ICD-10-CM | POA: Diagnosis not present

## 2023-05-31 ENCOUNTER — Ambulatory Visit: Payer: Medicaid Other | Admitting: Podiatry

## 2023-06-04 DIAGNOSIS — R52 Pain, unspecified: Secondary | ICD-10-CM | POA: Diagnosis not present

## 2023-06-04 DIAGNOSIS — H6691 Otitis media, unspecified, right ear: Secondary | ICD-10-CM | POA: Diagnosis not present

## 2023-06-05 ENCOUNTER — Ambulatory Visit: Payer: 59

## 2023-06-05 DIAGNOSIS — M25612 Stiffness of left shoulder, not elsewhere classified: Secondary | ICD-10-CM

## 2023-06-05 DIAGNOSIS — Z9013 Acquired absence of bilateral breasts and nipples: Secondary | ICD-10-CM | POA: Diagnosis not present

## 2023-06-05 DIAGNOSIS — Z9189 Other specified personal risk factors, not elsewhere classified: Secondary | ICD-10-CM | POA: Diagnosis not present

## 2023-06-05 DIAGNOSIS — M25611 Stiffness of right shoulder, not elsewhere classified: Secondary | ICD-10-CM

## 2023-06-05 DIAGNOSIS — Z483 Aftercare following surgery for neoplasm: Secondary | ICD-10-CM | POA: Diagnosis not present

## 2023-06-05 NOTE — Therapy (Signed)
Patient Details  Name: Jordan Willis MRN: [id number] Date of Birth: October 11, 1951 Referring Provider:  Santiago Glad, MD  Encounter Date: 06/05/2023   OUTPATIENT PHYSICAL THERAPY ONCOLOGY TREATMENT  Patient Name: Jordan Willis MRN: [id number] DOB:[birth year], 72 y.o., female Today's Date: 06/05/2023  END OF SESSION:  PT End of Session - 06/05/23 0814     Visit Number 9    Number of Visits 17    Date for PT Re-Evaluation 06/10/23    PT Start Time 0815    PT Stop Time 0857    PT Time Calculation (min) 42 min    Activity Tolerance Patient tolerated treatment well    Behavior During Therapy [facility] for tasks assessed/performed               Past Medical History:  Diagnosis Date   AICD (automatic cardioverter/defibrillator) present    Anemia    Anginal pain (HCC)    Anxiety    Arthritis    Lumbar spine DDD.   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   Diabetes mellitus without complication (HCC)    Type II   Dyspnea    When patient gets sick uses Pro-Air inhaler   Frequent sinus infections    GERD (gastroesophageal reflux disease)    occ   Goiter    History of cardiomegaly    History of cataract    Bilateral   History of kidney stones    08/28/2018 currently has a large kidney stone, asymptomatic at this time   History of migraine    History of uterine fibroid    History of uterine prolapse    Hyperlipidemia    Hypertension    LBBB (left bundle branch block)    Menopause 03/03/2012   Nasal turbinate hypertrophy 11/29/2016   Left inferior    Personal history of chemotherapy    Personal history of radiation therapy    Pneumonia    as a child   PONV (postoperative nausea and vomiting)    Sinusitis 2014   Sleep apnea    No CPAP   SUI (stress urinary incontinence, female)    SVD  (spontaneous vaginal delivery)    x 2   Thyroid nodule    Past Surgical History:  Procedure Laterality Date   ABDOMINAL HYSTERECTOMY     BILATERAL SALPINGOOPHORECTOMY Bilateral    BIV ICD INSERTION CRT-D N/A 08/24/2022   Procedure: BIV ICD INSERTION CRT-D;  Surgeon: Marinus Maw, MD;  Location: [facility] INVASIVE CV LAB;  Service: Cardiovascular;  Laterality: N/A;   BLADDER SUSPENSION N/A 05/16/2017   Procedure: TRANSVAGINAL TAPE (TVT) PROCEDURE;  Surgeon: Osborn Coho, MD;  Location: [facility] ORS;  Service: Gynecology;  Laterality: N/A;   BREAST BIOPSY Right 2009   BREAST IMPLANT REMOVAL Bilateral 02/05/2022   Procedure: Removal bilateral breast implants;  Surgeon: Allena Napoleon, MD;  Location: [facility] OR;  Service: Plastics;  Laterality: Bilateral;   BREAST LUMPECTOMY Right 2009   BREAST LUMPECTOMY Left 2006   BREAST RECONSTRUCTION WITH PLACEMENT OF TISSUE EXPANDER AND FLEX HD (ACELLULAR HYDRATED DERMIS) Bilateral 09/04/2021   Procedure: BILATERAL BREAST RECONSTRUCTION WITH PLACEMENT OF TISSUE EXPANDER AND FLEX HD (ACELLULAR HYDRATED DERMIS);  Surgeon: Allena Napoleon, MD;  Location: [facility] OR;  Service: Plastics;  Laterality: Bilateral;   CARDIAC CATHETERIZATION  CATARACT EXTRACTION Left 10/23/2017   CATARACT EXTRACTION Right 11/06/2017   COLONOSCOPY  10/22/2009   Normal.  Repeat 5 years.  [name]   COLONOSCOPY WITH PROPOFOL N/A 12/21/2022   Procedure: COLONOSCOPY WITH PROPOFOL;  Surgeon: Jeani Hawking, MD;  Location: [facility] ENDOSCOPY;  Service: Gastroenterology;  Laterality: N/A;   CYSTOSCOPY N/A 05/16/2017   Procedure: CYSTOSCOPY;  Surgeon: Osborn Coho, MD;  Location: [facility] ORS;  Service: Gynecology;  Laterality: N/A;   DEBRIDEMENT AND CLOSURE WOUND Bilateral 10/03/2021   Procedure: Debridement bilateral mastectomy flaps;  Surgeon: Allena Napoleon, MD;  Location: [facility] OR;  Service: Plastics;  Laterality: Bilateral;  1.5 hour   EYE SURGERY     bilateral cataracts   LEFT HEART CATH AND CORONARY  ANGIOGRAPHY N/A 02/09/2020   Procedure: LEFT HEART CATH AND CORONARY ANGIOGRAPHY;  Surgeon: Elder Negus, MD;  Location: [facility] INVASIVE CV LAB;  Service: Cardiovascular;  Laterality: N/A;   LEFT HEART CATH AND CORONARY ANGIOGRAPHY N/A 03/27/2022   Procedure: LEFT HEART CATH AND CORONARY ANGIOGRAPHY;  Surgeon: Yates Decamp, MD;  Location: [facility] INVASIVE CV LAB;  Service: Cardiovascular;  Laterality: N/A;   MASS EXCISION Right 12/04/2022   Procedure: EXCISION OF RIGHT CHEST WALL MASS;  Surgeon: Almond Lint, MD;  Location: [facility] OR;  Service: General;  Laterality: Right;   MASTECTOMY W/ SENTINEL NODE BIOPSY Right 09/04/2021   Procedure: BILATERAL MASTECTOMIES WITH RIGHT SENTINEL LYMPH NODE BIOPSY;  Surgeon: Almond Lint, MD;  Location: [facility] OR;  Service: General;  Laterality: Right;   REMOVAL OF BILATERAL TISSUE EXPANDERS WITH PLACEMENT OF BILATERAL BREAST IMPLANTS Bilateral 10/03/2021   Procedure: REMOVAL OF BILATERAL TISSUE EXPANDERS WITH REPLACEMENT OF TISSUE EXPANDERS;  Surgeon: Allena Napoleon, MD;  Location: [facility] OR;  Service: Plastics;  Laterality: Bilateral;   REMOVAL OF BILATERAL TISSUE EXPANDERS WITH PLACEMENT OF BILATERAL BREAST IMPLANTS Bilateral 12/19/2021   Procedure: REMOVAL OF BILATERAL TISSUE EXPANDERS WITH PLACEMENT OF BILATERAL BREAST IMPLANTS;  Surgeon: Allena Napoleon, MD;  Location: [facility] OR;  Service: Plastics;  Laterality: Bilateral;   ROBOTIC ASSISTED TOTAL HYSTERECTOMY N/A 05/16/2017   Procedure: ROBOTIC ASSISTED TOTAL HYSTERECTOMY;  Surgeon: Silverio Lay, MD;  Location: [facility] ORS;  Service: Gynecology;  Laterality: N/A;   ROTATOR CUFF REPAIR Bilateral    SCAR REVISION N/A 12/04/2022   Procedure: REVISION OF MASTECTOMY SCAR;  Surgeon: Almond Lint, MD;  Location: [facility] OR;  Service: General;  Laterality: N/A;   SHOULDER ARTHROSCOPY WITH ROTATOR CUFF REPAIR AND SUBACROMIAL DECOMPRESSION Right 09/03/2018   Procedure: Right shoulder manipulation under anesthesia, exam under anesthesia, mini  open rotator cuff repair, subacromial decompression;  Surgeon: Jene Every, MD;  Location: [facility] ORS;  Service: Orthopedics;  Laterality: Right;  90 mins   TUBAL LIGATION     WISDOM TOOTH EXTRACTION     Patient Active Problem List   Diagnosis Date Noted   ICD (implantable cardioverter-defibrillator) in place 12/25/2022   Bradycardia 08/24/2022   BRCA2 gene mutation positive in female 07/30/2022   LBBB (left bundle branch block)    Chronic diastolic heart failure (HCC)    NICM (nonischemic cardiomyopathy) (HCC)    Breast cancer of upper-outer quadrant of right female breast (HCC) 09/04/2021   Diabetic neuropathy (HCC) 02/08/2021   Angina pectoris (HCC) 02/09/2020   Abnormal stress test 02/09/2020   Complete rotator cuff tear 09/03/2018   Pelvic prolapse 05/16/2017   Intractable chronic post-traumatic headache 11/19/2016   Malignant neoplasm of upper-outer quadrant of left breast in female, estrogen receptor negative (HCC) 03/03/2012  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   Menopause 03/03/2012    PCP: Merri Brunette, MD  REFERRING PROVIDER: Santiago Glad, MD  REFERRING DIAG: breast cancer, stiffness in shoulders  Z98.890 (ICD-10-CM) - S/P scar revision THERAPY DIAG:  History of bilateral mastectomy  Aftercare following surgery for neoplasm  At risk for lymphedema  Stiffness of left shoulder, not elsewhere classified  Stiffness of right shoulder, not elsewhere classified  ONSET DATE:  February 2024   Rationale for Evaluation and Treatment: Rehabilitation  SUBJECTIVE:                                                                                                                                                                                           SUBJECTIVE STATEMENT I saw the MD last Thursday for my ears and sinus but I lost my pills.Yesterday I saw the Dr again because my ears were hurting. I did the bands on Saturday and I did them until I  get tired without counting them and I got so sore in my ribs after that. I have a double  ear infection but I have medicine now. My fever is gone today I think  PERTINENT HISTORY: Pt was diagnosed with right breast cancer dx 05/2021. She has BRCA2 genetic mutation and has had left breast cancer in 2006 with chemo/breast conservation and XRT. She then had right breast cancer (dcis) with lumpectomy and radiation only. Dr. Lurene Shadow was her surgeon. She presented this time with a palpable painful mass in the upper outer right breast. Dx imaging showed a 1.2 cm mass at 10 o'clock. 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 The cardiomyopathy recovered, but the neuropathy did not. Her sister and mother have both had breast cancer. Pt underwent bilateral mastectomies with right sentinel node biopsy and expander reconstruction (Pace) 09/04/21. Final path at that time was [id number] with 6 negative nodes and negative margins. Patient ended up having the expanders removed because of continued drainage and discomfort.  2.14.2024 : revision of left mastectomy scar and removal of mass on right lateral mastectomy scar. She was found to have a nodule there and diagnostic imaging was performed. Ultrasound showed a 1.3 cm irregular mass at 9:00. Core needle biopsy was performed and this demonstrated invasive ductal carcinoma, grade 3. This was weakly ER positive, PR negative, HER2 positive, Ki-67 70%. She is currently undergoing chemotherapy   Pt also has a pacemaker  and defibrillator.  She has a decrease ejection fraction of 30% likely exacerbated by the chemotherapy.  Review of Systems:  PAIN:   PAIN:  Are you having pain? 6-7/10 left greater than right chest area, knees but I think its the weather   PRECAUTIONS: Fall and Other: at risk for lymphedema, cardiac precautions   WEIGHT BEARING RESTRICTIONS: No  FALLS:   Has patient fallen in last 6 months? No  LIVING ENVIRONMENT: Lives with: lives with their family OCCUPATION: does not work   LEISURE: wants to go to [employer] she can only walk one block before she fatigues   HAND DOMINANCE: right   PRIOR LEVEL OF FUNCTION: Independent though limited by fatigue   PATIENT GOALS: to get things back on the top shelf of her cabinets    OBJECTIVE:  COGNITION: Overall cognitive status: Within functional limits for tasks assessed   PALPATION: Tight limited skin excursion across both sides of her chest with limited scar mobility   OBSERVATIONS / OTHER ASSESSMENTS: pt with visible tightness at chest with contraction of scars and fullness above scars    POSTURE: forward head rounded shoulders   UPPER EXTREMITY AROM/PROM:  A/PROM RIGHT   eval  RIGHT 05/09/2023  Shoulder extension 20 46  Shoulder flexion 110 138  Shoulder abduction 90 130  Shoulder internal rotation 20 with pain  40  Shoulder external rotation 90 97    (Blank rows = not tested)  A/PROM LEFT   eval LEFT 05/09/2023  Shoulder extension 20 45  Shoulder flexion 120 135  Shoulder abduction 95 130  Shoulder internal rotation 5 with pain  40  Shoulder external rotation 90 102    (Blank rows = not tested)  UPPER EXTREMITY STRENGTH: less that 3/5 as pt is not able to lift arms to end range of her available range of motion, diffuse muscle atrophy visible  LYMPHEDEMA ASSESSMENTS:  SURGERY TYPE/DATE: multiple surgeries 2006, 2022, 2024 NUMBER OF LYMPH NODES REMOVED: 6 CHEMOTHERAPY: yes, ongoing  RADIATION:yes HORMONE TREATMENT: yes  INFECTIONS: no  LYMPHEDEMA ASSESSMENTS:   LANDMARK RIGHT  eval  10 cm proximal to olecranon process 35  Olecranon process 28  10 cm proximal to ulnar styloid process 22.7  Just proximal to ulnar styloid process 17.5  Across hand at thumb web space 20  At base of 2nd digit 6.5  (Blank rows = not tested)  LANDMARK LEFT  eval  10 cm  proximal to olecranon process 35  Olecranon process 27.5  10 cm proximal to ulnar styloid process 21.5  Just proximal to ulnar styloid process 17.5  Across hand at thumb web space 20  At base of 2nd digit 6.5  (Blank rows = not tested)  FUNCTIONAL TESTS:  6 reps in  30 seconds chair stand test  " pt very slow   QUICK DASH SURVEY: 45.45  TODAY'S TREATMENT:  DATE:  06/05/2023 Held exs due to recovering from fever and not feeling great today STM to bil lateral chest wall/trunk/UTover palpably tight musculature being mindful of pacemaker on Rt chest, and to right UT/scapular area in SL P/ROM in supine to bil shoulders into flex, scaption and D2 , and ER with scapular depression throughout Supine bilateral AROM flexion, scaption, horizontal abd x 5 Treatment decreased due to 2 bathroom breaks 05/29/23: Therapeutic Exercises NuStep level 5 x 1:45, level 4 for remaining time up to 6 mins, 430 steps; pt reports being challenged by this  Standing in // bars for bil hip 3 way raises x 10 each, pt returned therapist demo for each and VC's during for corrected LE position and to decrease trunk and pelvis compensations Supine Scapular series with red theraband x 10 each returning therapist demo for each; pt reports her arms felt tired after these so issued yellow for her to use initially at home  Manual Therapy STM to bil lateral chest wall/trunk/UT over palpably tight musculature being mindful of pacemaker on Rt chest, and to right UT/scapular area in SL P/ROM in supine to bil shoulders into flex, scaption and D2 with scapular depression throughout  05/09/2023 STM to bil lateral chest wall/trunk/UTover palpably tight musculature being mindful of pacemaker on Rt chest, and to right UT/scapular area in SL P/ROM in supine to bil shoulders into flex, scaption and D2 , and  ER with scapular depression throughout Supine bilateral AROM flexion, scaption, ABD Measured AROM Jobes flexion x 5   05/06/2023 Pulleys x 2 min flex and abd Ball rolls on wall x 10 Supine bilateral UE AROM flexion, scaption, horizntal abd x 5 STM to bil lateral chest wall/trunk/UTover palpably tight musculature being mindful of pacemaker on Rt chest, and to right UT/scapular area in SL MFR to left chest region and right  chest avoiding pacemaker Supine wand flexion and scaption x 5 P/ROM in supine to bil shoulders into flex, scaption and D2 , and ER with scapular depression throughout  05/01/23 STM to bil lateral chest wall/trunk/UTover palpably tight musculature being mindful of pacemaker on Rt chest, and to right UT/scapular area in SL Supine wand flexion and scaption x 5 P/ROM in supine to bil shoulders into flex, scaption and D2 , and ER with scapular depression throughout  04/29/23: Therapeutic Exercises Pulleys into flex and scaption x 2 mins each Roll yellow ball up wall into flexion x 10 and bil abd x 5 each returning therapist demo Supine over half foam for following: Bil UE horz abd, bil UE scaption into a "V" x 10 each; and then bil abd "snow angel" x 5, 5 sec holds  Manual Therapy P/ROM in supine to bil shoulders into flex, scaption and D2 with scapular depression throughout STM to bil lateral chest wall/trunk over palpably tight musculature being mindful of pacemaker on Rt chest  04/18/23: Manual Therapy P/ROM in supine to bil shoulders into flex, scaption and D2 with scapular depression throughout STM to bil lateral chest wall/trunk over palpably tight musculature Therapeutic Exercises Pulleys into flex and scaption x 2 mins each returning therapist demo and VC's during to decr scapular compensation Lt UE NTS on wall as pt reports this UE tingles when she tries to lay on it at night; encouraged to do this only about 5 reps at a time and to not overstretch the nerve.  Roll  yellow ball on wall for flexion x 10 returning therapist demo and VC's with demo to decr scap  compensation throughout. Pt able to do this with cuing   04/15/23: In supine PROM to bilateral shoulders in to flexion, abduction and ER with increased tightness bilaterally and discomfort. Pt guarded throughout. At end of session pt had a spasm in her R pec that eased with STM and stretching. Instructed pt in seated pec stretch and encouraged her to try doorway or corner stretch at home as long as no sharp pain  04/09/2023 evaluation   PATIENT EDUCATION:  Education details: Standing bil hip 3 way raises and supine scapular series Person educated: Patient Education method: Explanation, demonstration, verbal cues, handout issued Education comprehension: verbalized understanding, returned demo, VC's, will benefit from further review  HOME EXERCISE PROGRAM: Pt encouraged to do the broomstick exercises and shoulder exercises she has previously been doing  Doorway or corner stretch Standing bil LE hip 3 way raises and supine scapular series  ASSESSMENT:  CLINICAL IMPRESSION: Held exercises today due to time and pt with double ear infection and not feeling great, but no fever.  Encouraged pt to hold band exercises until she feels better, but to continue ROM and AROM. Also advised to count repetitions to prevent excessive soreness.   OBJECTIVE IMPAIRMENTS: cardiopulmonary status limiting activity, decreased activity tolerance, decreased endurance, decreased ROM, decreased strength, increased fascial restrictions, impaired perceived functional ability, impaired UE functional use, and postural dysfunction.   ACTIVITY LIMITATIONS: carrying, lifting, standing, reach over head, and locomotion level  PARTICIPATION LIMITATIONS: meal prep, cleaning, laundry, and yard work  PERSONAL FACTORS: Age, Past/current experiences, and 3+ comorbidities: previous surgery, decreased cardiac status, onling chemotherapy   are  also affecting patient's functional outcome.   REHAB POTENTIAL: Good  CLINICAL DECISION MAKING: Stable/uncomplicated  EVALUATION COMPLEXITY: Low  GOALS: Goals reviewed with patient? Yes  SHORT TERM GOALS: Target date: 05/09/2023  Pt will be independent in a basic exercise program for shoulder ROM and Strenth  Baseline:needs review Goal status: INITIAL  LONG TERM GOALS: Target date: 8/18.2024  Pt will decrease quick DASH score to < 20 indicating an improvement in functional use of her UE's Baseline: 45.45 Goal status: INITIAL  2.  Pt will have increased shoulder flextion of >  135 degrees in both shoulders so that she can use her arms better at home  Baseline: 110 on right and 120 on left  Goal status: INITIAL  3.  Pt will report that she can put things on the top shelf of her cabinets at home without using a stepstool  Baseline: unable  Goal status: INITIAL  4.  Pt will increase to 10 reps of sit to stand in 30 seconds indicating an improvement in activity tolerance and functional strength  Baseline: 6 Goal status: INITIAL  PLAN:  PT FREQUENCY: 1-2x/week  PT DURATION: 8 weeks  PLANNED INTERVENTIONS: Therapeutic exercises, Therapeutic activity, Neuromuscular re-education, Balance training, Gait training, Patient/Family education, Self Care, Joint mobilization, scar mobilization, and Manual therapy  PLAN FOR NEXT SESSION: Cont manual work for soft tissue and scar mobilization, passive and A/AA/ROM excise for shoulders; add strength, Cont nustep and functional endurance exercise   Waynette Buttery, PT 06/05/2023, 8:58 AM   [facility] [address] [place], [place], [address] Phone: [phone]   Fax:  [phone]         Cancer Rehab [phone] HIP: Flexion Standing    Holding onto counter lift leg straight in front keeping knee straight. Slow and controlled! _10__ reps per set, _2-3__ sets per day. Then repeat with other  leg.   Hip Extension (Standing)    Stand with support at counter. Squeeze pelvic floor and hold so as not to twist hips and don't lean forward.  Move right leg backward with straight knee. Slow and controlled! Repeat _10__ times. Do _2-3__ times a day. Repeat with other leg.  Hip Abduction (Standing)    Stand with support. Squeeze pelvic floor and hold. Lift right leg out to side, keeping toe forward.  Repeat _10__ times. Do _2-3__ times a day. Repeat with other leg.    Over Head Pull: Narrow and Wide Grip   Start with yellow band for all then progress to red once this becomes easier.   On back, knees bent, feet flat, band across thighs, elbows straight but relaxed. Pull hands apart (start). Keeping elbows straight, bring arms up and over head, hands toward floor. Keep pull steady on band. Hold momentarily. Return slowly, keeping pull steady, back to start. Then do same with a wider grip on the band (past shoulder width) Repeat _5-10__ times. Band color __red____   Side Pull: Double Arm   On back, knees bent, feet flat. Arms perpendicular to body, shoulder level, elbows straight but relaxed. Pull arms out to sides, elbows straight. Resistance band comes across collarbones, hands toward floor. Hold momentarily. Slowly return to starting position. Repeat _5-10__ times. Band color _red____   Sword   On back, knees bent, feet flat, left hand on left hip, right hand above left. Pull right arm DIAGONALLY (hip to shoulder) across chest. Bring right arm along head toward floor. Hold momentarily. Slowly return to starting position. Repeat _5-10__ times. Do with left arm. Band color _red_____   Shoulder Rotation: Double Arm   On back, knees bent, feet flat, elbows tucked at sides, bent 90, hands palms up. Pull hands apart and down toward floor, keeping elbows near sides. Hold momentarily. Slowly return to starting position. Repeat _5-10__ times. Band color __red____

## 2023-06-10 ENCOUNTER — Other Ambulatory Visit: Payer: Self-pay

## 2023-06-10 ENCOUNTER — Ambulatory Visit: Payer: 59

## 2023-06-10 DIAGNOSIS — C50412 Malignant neoplasm of upper-outer quadrant of left female breast: Secondary | ICD-10-CM

## 2023-06-10 DIAGNOSIS — Z483 Aftercare following surgery for neoplasm: Secondary | ICD-10-CM

## 2023-06-10 DIAGNOSIS — M25611 Stiffness of right shoulder, not elsewhere classified: Secondary | ICD-10-CM | POA: Diagnosis not present

## 2023-06-10 DIAGNOSIS — M25612 Stiffness of left shoulder, not elsewhere classified: Secondary | ICD-10-CM

## 2023-06-10 DIAGNOSIS — Z9013 Acquired absence of bilateral breasts and nipples: Secondary | ICD-10-CM | POA: Diagnosis not present

## 2023-06-10 DIAGNOSIS — Z9189 Other specified personal risk factors, not elsewhere classified: Secondary | ICD-10-CM

## 2023-06-10 NOTE — Therapy (Signed)
Patient Details  Name: Jordan Willis MRN: [id number] Date of Birth: 16-Oct-1951 Referring Provider:  Santiago Glad, MD  Encounter Date: 06/10/2023   OUTPATIENT PHYSICAL THERAPY ONCOLOGY TREATMENT  Patient Name: Jordan Willis MRN: [id number] DOB:[birth year], 72 y.o., female Today's Date: 06/10/2023  END OF SESSION:  PT End of Session - 06/10/23 1000     Visit Number 10    Number of Visits 18    Date for PT Re-Evaluation 07/08/23    PT Start Time 1000    PT Stop Time 1055    PT Time Calculation (min) 55 min    Activity Tolerance Patient tolerated treatment well    Behavior During Therapy WFL for tasks assessed/performed               Past Medical History:  Diagnosis Date   AICD (automatic cardioverter/defibrillator) present    Anemia    Anginal pain (HCC)    Anxiety    Arthritis    Lumbar spine DDD.   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   Diabetes mellitus without complication (HCC)    Type II   Dyspnea    When patient gets sick uses Pro-Air inhaler   Frequent sinus infections    GERD (gastroesophageal reflux disease)    occ   Goiter    History of cardiomegaly    History of cataract    Bilateral   History of kidney stones    08/28/2018 currently has a large kidney stone, asymptomatic at this time   History of migraine    History of uterine fibroid    History of uterine prolapse    Hyperlipidemia    Hypertension    LBBB (left bundle branch block)    Menopause 03/03/2012   Nasal turbinate hypertrophy 11/29/2016   Left inferior    Personal history of chemotherapy    Personal history of radiation therapy    Pneumonia    as a child   PONV (postoperative nausea and vomiting)    Sinusitis 2014   Sleep apnea    No CPAP   SUI (stress urinary incontinence, female)    SVD  (spontaneous vaginal delivery)    x 2   Thyroid nodule    Past Surgical History:  Procedure Laterality Date   ABDOMINAL HYSTERECTOMY     BILATERAL SALPINGOOPHORECTOMY Bilateral    BIV ICD INSERTION CRT-D N/A 08/24/2022   Procedure: BIV ICD INSERTION CRT-D;  Surgeon: Marinus Maw, MD;  Location: [facility] INVASIVE CV LAB;  Service: Cardiovascular;  Laterality: N/A;   BLADDER SUSPENSION N/A 05/16/2017   Procedure: TRANSVAGINAL TAPE (TVT) PROCEDURE;  Surgeon: Osborn Coho, MD;  Location: [facility] ORS;  Service: Gynecology;  Laterality: N/A;   BREAST BIOPSY Right 2009   BREAST IMPLANT REMOVAL Bilateral 02/05/2022   Procedure: Removal bilateral breast implants;  Surgeon: Allena Napoleon, MD;  Location: [facility] OR;  Service: Plastics;  Laterality: Bilateral;   BREAST LUMPECTOMY Right 2009   BREAST LUMPECTOMY Left 2006   BREAST RECONSTRUCTION WITH PLACEMENT OF TISSUE EXPANDER AND FLEX HD (ACELLULAR HYDRATED DERMIS) Bilateral 09/04/2021   Procedure: BILATERAL BREAST RECONSTRUCTION WITH PLACEMENT OF TISSUE EXPANDER AND FLEX HD (ACELLULAR HYDRATED DERMIS);  Surgeon: Allena Napoleon, MD;  Location: [facility] OR;  Service: Plastics;  Laterality: Bilateral;   CARDIAC CATHETERIZATION  CATARACT EXTRACTION Left 10/23/2017   CATARACT EXTRACTION Right 11/06/2017   COLONOSCOPY  10/22/2009   Normal.  Repeat 5 years.  [name]   COLONOSCOPY WITH PROPOFOL N/A 12/21/2022   Procedure: COLONOSCOPY WITH PROPOFOL;  Surgeon: Jeani Hawking, MD;  Location: [facility];  Service: Gastroenterology;  Laterality: N/A;   CYSTOSCOPY N/A 05/16/2017   Procedure: CYSTOSCOPY;  Surgeon: Osborn Coho, MD;  Location: [facility] ORS;  Service: Gynecology;  Laterality: N/A;   DEBRIDEMENT AND CLOSURE WOUND Bilateral 10/03/2021   Procedure: Debridement bilateral mastectomy flaps;  Surgeon: Allena Napoleon, MD;  Location: [facility] OR;  Service: Plastics;  Laterality: Bilateral;  1.5 hour   EYE SURGERY     bilateral cataracts   LEFT HEART CATH AND CORONARY  ANGIOGRAPHY N/A 02/09/2020   Procedure: LEFT HEART CATH AND CORONARY ANGIOGRAPHY;  Surgeon: Elder Negus, MD;  Location: [facility] INVASIVE CV LAB;  Service: Cardiovascular;  Laterality: N/A;   LEFT HEART CATH AND CORONARY ANGIOGRAPHY N/A 03/27/2022   Procedure: LEFT HEART CATH AND CORONARY ANGIOGRAPHY;  Surgeon: Yates Decamp, MD;  Location: [facility] INVASIVE CV LAB;  Service: Cardiovascular;  Laterality: N/A;   MASS EXCISION Right 12/04/2022   Procedure: EXCISION OF RIGHT CHEST WALL MASS;  Surgeon: Almond Lint, MD;  Location: [facility] OR;  Service: General;  Laterality: Right;   MASTECTOMY W/ SENTINEL NODE BIOPSY Right 09/04/2021   Procedure: BILATERAL MASTECTOMIES WITH RIGHT SENTINEL LYMPH NODE BIOPSY;  Surgeon: Almond Lint, MD;  Location: [facility] OR;  Service: General;  Laterality: Right;   REMOVAL OF BILATERAL TISSUE EXPANDERS WITH PLACEMENT OF BILATERAL BREAST IMPLANTS Bilateral 10/03/2021   Procedure: REMOVAL OF BILATERAL TISSUE EXPANDERS WITH REPLACEMENT OF TISSUE EXPANDERS;  Surgeon: Allena Napoleon, MD;  Location: [facility] OR;  Service: Plastics;  Laterality: Bilateral;   REMOVAL OF BILATERAL TISSUE EXPANDERS WITH PLACEMENT OF BILATERAL BREAST IMPLANTS Bilateral 12/19/2021   Procedure: REMOVAL OF BILATERAL TISSUE EXPANDERS WITH PLACEMENT OF BILATERAL BREAST IMPLANTS;  Surgeon: Allena Napoleon, MD;  Location: [facility] OR;  Service: Plastics;  Laterality: Bilateral;   ROBOTIC ASSISTED TOTAL HYSTERECTOMY N/A 05/16/2017   Procedure: ROBOTIC ASSISTED TOTAL HYSTERECTOMY;  Surgeon: Silverio Lay, MD;  Location: [facility] ORS;  Service: Gynecology;  Laterality: N/A;   ROTATOR CUFF REPAIR Bilateral    SCAR REVISION N/A 12/04/2022   Procedure: REVISION OF MASTECTOMY SCAR;  Surgeon: Almond Lint, MD;  Location: [facility] OR;  Service: General;  Laterality: N/A;   SHOULDER ARTHROSCOPY WITH ROTATOR CUFF REPAIR AND SUBACROMIAL DECOMPRESSION Right 09/03/2018   Procedure: Right shoulder manipulation under anesthesia, exam under anesthesia, mini  open rotator cuff repair, subacromial decompression;  Surgeon: Jene Every, MD;  Location: [facility] ORS;  Service: Orthopedics;  Laterality: Right;  90 mins   TUBAL LIGATION     WISDOM TOOTH EXTRACTION     Patient Active Problem List   Diagnosis Date Noted   ICD (implantable cardioverter-defibrillator) in place 12/25/2022   Bradycardia 08/24/2022   BRCA2 gene mutation positive in female 07/30/2022   LBBB (left bundle branch block)    Chronic diastolic heart failure (HCC)    NICM (nonischemic cardiomyopathy) (HCC)    Breast cancer of upper-outer quadrant of right female breast (HCC) 09/04/2021   Diabetic neuropathy (HCC) 02/08/2021   Angina pectoris (HCC) 02/09/2020   Abnormal stress test 02/09/2020   Complete rotator cuff tear 09/03/2018   Pelvic prolapse 05/16/2017   Intractable chronic post-traumatic headache 11/19/2016   Malignant neoplasm of upper-outer quadrant of left breast in female, estrogen receptor negative (HCC) 03/03/2012  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   Menopause 03/03/2012    PCP: Merri Brunette, MD  REFERRING PROVIDER: Santiago Glad, MD  REFERRING DIAG: breast cancer, stiffness in shoulders  Z98.890 (ICD-10-CM) - S/P scar revision THERAPY DIAG:  History of bilateral mastectomy  Aftercare following surgery for neoplasm  At risk for lymphedema  Stiffness of left shoulder, not elsewhere classified  Stiffness of right shoulder, not elsewhere classified  ONSET DATE:  February 2024   Rationale for Evaluation and Treatment: Rehabilitation  SUBJECTIVE:                                                                                                                                                                                           SUBJECTIVE STATEMENT Still not feeling up to par today. I am still on the antibiotics. I still have a lot of drainage.  I  am hurting on the left chest. I had some some spasms in the left over the  weekend.  PERTINENT HISTORY: Pt was diagnosed with right breast cancer dx 05/2021. She has BRCA2 genetic mutation and has had left breast cancer in 2006 with chemo/breast conservation and XRT. She then had right breast cancer (dcis) with lumpectomy and radiation only. Dr. Lurene Shadow was her surgeon. She presented this time with a palpable painful mass in the upper outer right breast. Dx imaging showed a 1.2 cm mass at 10 o'clock. 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 The cardiomyopathy recovered, but the neuropathy did not. Her sister and mother have both had breast cancer. Pt underwent bilateral mastectomies with right sentinel node biopsy and expander reconstruction (Pace) 09/04/21. Final path at that time was [id number] with 6 negative nodes and negative margins. Patient ended up having the expanders removed because of continued drainage and discomfort.  2.14.2024 : revision of left mastectomy scar and removal of mass on right lateral mastectomy scar. She was found to have a nodule there and diagnostic imaging was performed. Ultrasound showed a 1.3 cm irregular mass at 9:00. Core needle biopsy was performed and this demonstrated invasive ductal carcinoma, grade 3. This was weakly ER positive, PR negative, HER2 positive, Ki-67 70%. She is currently undergoing chemotherapy   Pt also has a pacemaker  and defibrillator.  She has a decrease ejection fraction of 30% likely exacerbated by the chemotherapy.    Review of Systems:  PAIN:   PAIN:  Are you having pain? I have a HA today 7/10 PAIN:  Are you having pain? Yes NPRS scale: 6/10 left chest Pain  location: left chest Pain orientation: Left  PAIN TYPE: sharp Pain description: intermittent  Aggravating factors: just comes on Relieving factors: spasm pill, massage    PRECAUTIONS: Fall and Other: at risk for lymphedema, cardiac precautions   WEIGHT  BEARING RESTRICTIONS: No  FALLS:  Has patient fallen in last 6 months? No  LIVING ENVIRONMENT: Lives with: lives with their family OCCUPATION: does not work   LEISURE: wants to go to [employer] she can only walk one block before she fatigues   HAND DOMINANCE: right   PRIOR LEVEL OF FUNCTION: Independent though limited by fatigue   PATIENT GOALS: to get things back on the top shelf of her cabinets    OBJECTIVE:  COGNITION: Overall cognitive status: Within functional limits for tasks assessed   PALPATION: Tight limited skin excursion across both sides of her chest with limited scar mobility   OBSERVATIONS / OTHER ASSESSMENTS: pt with visible tightness at chest with contraction of scars and fullness above scars    POSTURE: forward head rounded shoulders   UPPER EXTREMITY AROM/PROM:  A/PROM RIGHT   eval  RIGHT 05/09/2023 RIGHT 06/10/2023  Shoulder extension 20 46   Shoulder flexion 110 138 136  Shoulder abduction 90 130 135  Shoulder internal rotation 20 with pain  40   Shoulder external rotation 90 97     (Blank rows = not tested)  A/PROM LEFT   eval LEFT 05/09/2023 LEFT 06/10/2023  Shoulder extension 20 45   Shoulder flexion 120 135 136  Shoulder abduction 95 130 136  Shoulder internal rotation 5 with pain  40   Shoulder external rotation 90 102     (Blank rows = not tested)  UPPER EXTREMITY STRENGTH: less that 3/5 as pt is not able to lift arms to end range of her available range of motion, diffuse muscle atrophy visible  LYMPHEDEMA ASSESSMENTS:  SURGERY TYPE/DATE: multiple surgeries 2006, 2022, 2024 NUMBER OF LYMPH NODES REMOVED: 6 CHEMOTHERAPY: yes, ongoing  RADIATION:yes HORMONE TREATMENT: yes  INFECTIONS: no  LYMPHEDEMA ASSESSMENTS:   LANDMARK RIGHT  eval  10 cm proximal to olecranon process 35  Olecranon process 28  10 cm proximal to ulnar styloid process 22.7  Just proximal to ulnar styloid process 17.5  Across hand at thumb web space 20   At base of 2nd digit 6.5  (Blank rows = not tested)  LANDMARK LEFT  eval  10 cm proximal to olecranon process 35  Olecranon process 27.5  10 cm proximal to ulnar styloid process 21.5  Just proximal to ulnar styloid process 17.5  Across hand at thumb web space 20  At base of 2nd digit 6.5  (Blank rows = not tested)  FUNCTIONAL TESTS:  6 reps in  30 seconds chair stand test  " pt very slow   QUICK DASH SURVEY: 45.45  TODAY'S TREATMENT:  DATE:   06/10/2023 Held Nustep and standing exs as pt still not feeling very well. Left chest intermittent sharp pain today  and with bad HA  STM to bil lateral chest wall/lateral trunk/UT over palpably tight musculature being mindful of pacemaker on Rt chest,  P/ROM in supine to bil shoulders into flex, scaption and D2 , and ER with scapular depression throughout Measured AROM and checked goals for recert  06/05/2023 Held exs due to recovering from fever and not feeling great today STM to bil lateral chest wall/trunk/UTover palpably tight musculature being mindful of pacemaker on Rt chest, and to right UT/scapular area in SL P/ROM in supine to bil shoulders into flex, scaption and D2 , and ER with scapular depression throughout Supine bilateral AROM flexion, scaption, horizontal abd x 5 Treatment decreased due to 2 bathroom breaks 05/29/23: Therapeutic Exercises NuStep level 5 x 1:45, level 4 for remaining time up to 6 mins, 430 steps; pt reports being challenged by this  Standing in // bars for bil hip 3 way raises x 10 each, pt returned therapist demo for each and VC's during for corrected LE position and to decrease trunk and pelvis compensations Supine Scapular series with red theraband x 10 each returning therapist demo for each; pt reports her arms felt tired after these so issued yellow for her to use initially  at home  Manual Therapy STM to bil lateral chest wall/trunk/UT over palpably tight musculature being mindful of pacemaker on Rt chest, and to right UT/scapular area in SL P/ROM in supine to bil shoulders into flex, scaption and D2 with scapular depression throughout  05/09/2023 STM to bil lateral chest wall/trunk/UTover palpably tight musculature being mindful of pacemaker on Rt chest, and to right UT/scapular area in SL P/ROM in supine to bil shoulders into flex, scaption and D2 , and ER with scapular depression throughout Supine bilateral AROM flexion, scaption, ABD Measured AROM Jobes flexion x 5   05/06/2023 Pulleys x 2 min flex and abd Ball rolls on wall x 10 Supine bilateral UE AROM flexion, scaption, horizntal abd x 5 STM to bil lateral chest wall/trunk/UTover palpably tight musculature being mindful of pacemaker on Rt chest, and to right UT/scapular area in SL MFR to left chest region and right  chest avoiding pacemaker Supine wand flexion and scaption x 5 P/ROM in supine to bil shoulders into flex, scaption and D2 , and ER with scapular depression throughout  05/01/23 STM to bil lateral chest wall/trunk/UTover palpably tight musculature being mindful of pacemaker on Rt chest, and to right UT/scapular area in SL Supine wand flexion and scaption x 5 P/ROM in supine to bil shoulders into flex, scaption and D2 , and ER with scapular depression throughout  04/29/23: Therapeutic Exercises Pulleys into flex and scaption x 2 mins each Roll yellow ball up wall into flexion x 10 and bil abd x 5 each returning therapist demo Supine over half foam for following: Bil UE horz abd, bil UE scaption into a "V" x 10 each; and then bil abd "snow angel" x 5, 5 sec holds  Manual Therapy P/ROM in supine to bil shoulders into flex, scaption and D2 with scapular depression throughout STM to bil lateral chest wall/trunk over palpably tight musculature being mindful of pacemaker on Rt  chest  04/18/23: Manual Therapy P/ROM in supine to bil shoulders into flex, scaption and D2 with scapular depression throughout STM to bil lateral chest wall/trunk over palpably tight musculature Therapeutic Exercises Pulleys into flex and scaption  x 2 mins each returning therapist demo and VC's during to decr scapular compensation Lt UE NTS on wall as pt reports this UE tingles when she tries to lay on it at night; encouraged to do this only about 5 reps at a time and to not overstretch the nerve.  Roll yellow ball on wall for flexion x 10 returning therapist demo and VC's with demo to decr scap compensation throughout. Pt able to do this with cuing   04/15/23: In supine PROM to bilateral shoulders in to flexion, abduction and ER with increased tightness bilaterally and discomfort. Pt guarded throughout. At end of session pt had a spasm in her R pec that eased with STM and stretching. Instructed pt in seated pec stretch and encouraged her to try doorway or corner stretch at home as long as no sharp pain  04/09/2023 evaluation   PATIENT EDUCATION:  Education details: Standing bil hip 3 way raises and supine scapular series Person educated: Patient Education method: Explanation, demonstration, verbal cues, handout issued Education comprehension: verbalized understanding, returned demo, VC's, will benefit from further review  HOME EXERCISE PROGRAM: Pt encouraged to do the broomstick exercises and shoulder exercises she has previously been doing  Doorway or corner stretch Standing bil LE hip 3 way raises and supine scapular series  ASSESSMENT:  CLINICAL IMPRESSION:  Pt has not felt well for over a week and as a result she has done some exs at home, but not all. She has met STG #1, and has nearly Met LTG #2 for ROM. She has made very good improvements with ROM. She continues with tightness in lateral chest from her multiple surgeries, and has been experiencing intermittent sharp pains in the  left chest. When she is feeling well she is able to be much more active, but we have held some of her exercises over the last week or more due to illness. She will continue to benefit from skilled PT to address remaining deficits and to return to PLOF. OBJECTIVE IMPAIRMENTS: cardiopulmonary status limiting activity, decreased activity tolerance, decreased endurance, decreased ROM, decreased strength, increased fascial restrictions, impaired perceived functional ability, impaired UE functional use, and postural dysfunction.   ACTIVITY LIMITATIONS: carrying, lifting, standing, reach over head, and locomotion level  PARTICIPATION LIMITATIONS: meal prep, cleaning, laundry, and yard work  PERSONAL FACTORS: Age, Past/current experiences, and 3+ comorbidities: previous surgery, decreased cardiac status, onling chemotherapy   are also affecting patient's functional outcome.   REHAB POTENTIAL: Good  CLINICAL DECISION MAKING: Stable/uncomplicated  EVALUATION COMPLEXITY: Low  GOALS: Goals reviewed with patient? Yes  SHORT TERM GOALS: Target date: 05/09/2023  Pt will be independent in a basic exercise program for shoulder ROM and Strenth  Baseline:needs review Goal status:MET 06/10/2023 LONG TERM GOALS: Target date: 8/18.2024  Pt will decrease quick DASH score to < 20 indicating an improvement in functional use of her UE's Baseline: 45.45 Goal status: ONGOING  2.  Pt will have increased shoulder flextion of >  135 degrees in both shoulders so that she can use her arms better at home  Baseline: 110 on right and 120 on left  Goal status: Right MET 136, Left ongoing 135 today 06/10/2023  3.  Pt will report that she can put things on the top shelf of her cabinets at home without using a stepstool  Baseline: unable  Goal status: ONGOING, Hasn't tried much 4.  Pt will increase to 10 reps of sit to stand in 30 seconds indicating an improvement in activity tolerance  and functional strength  Baseline:  6 Goal status: Ongoing, did not test today due to not feeling well 06/10/23  PLAN:  PT FREQUENCY: 1-2x/week  PT DURATION:4 weeks PLANNED INTERVENTIONS: Therapeutic exercises, Therapeutic activity, Neuromuscular re-education, Balance training, Gait training, Patient/Family education, Self Care, Joint mobilization, scar mobilization, and Manual therapy  PLAN FOR NEXT SESSION: Cont manual work for soft tissue and scar mobilization, passive and A/AA/ROM excise for shoulders; add strength, Cont nustep and functional endurance exercise   Waynette Buttery, PT 06/10/2023, 10:57 AM   [facility] [address] [place], [place], [address] Phone: [phone]   Fax:  [phone]         [facility] [phone] HIP: Flexion Standing    Holding onto counter lift leg straight in front keeping knee straight. Slow and controlled! _10__ reps per set, _2-3__ sets per day. Then repeat with other leg.   Hip Extension (Standing)    Stand with support at counter. Squeeze pelvic floor and hold so as not to twist hips and don't lean forward.  Move right leg backward with straight knee. Slow and controlled! Repeat _10__ times. Do _2-3__ times a day. Repeat with other leg.  Hip Abduction (Standing)    Stand with support. Squeeze pelvic floor and hold. Lift right leg out to side, keeping toe forward.  Repeat _10__ times. Do _2-3__ times a day. Repeat with other leg.    Over Head Pull: Narrow and Wide Grip   Start with yellow band for all then progress to red once this becomes easier.   On back, knees bent, feet flat, band across thighs, elbows straight but relaxed. Pull hands apart (start). Keeping elbows straight, bring arms up and over head, hands toward floor. Keep pull steady on band. Hold momentarily. Return slowly, keeping pull steady, back to start. Then do same with a wider grip on the band (past shoulder width) Repeat _5-10__ times. Band color  __red____   Side Pull: Double Arm   On back, knees bent, feet flat. Arms perpendicular to body, shoulder level, elbows straight but relaxed. Pull arms out to sides, elbows straight. Resistance band comes across collarbones, hands toward floor. Hold momentarily. Slowly return to starting position. Repeat _5-10__ times. Band color _red____   Sword   On back, knees bent, feet flat, left hand on left hip, right hand above left. Pull right arm DIAGONALLY (hip to shoulder) across chest. Bring right arm along head toward floor. Hold momentarily. Slowly return to starting position. Repeat _5-10__ times. Do with left arm. Band color _red_____   Shoulder Rotation: Double Arm   On back, knees bent, feet flat, elbows tucked at sides, bent 90, hands palms up. Pull hands apart and down toward floor, keeping elbows near sides. Hold momentarily. Slowly return to starting position. Repeat _5-10__ times. Band color __red____

## 2023-06-11 ENCOUNTER — Inpatient Hospital Stay: Payer: 59

## 2023-06-11 ENCOUNTER — Encounter: Payer: Self-pay | Admitting: Podiatry

## 2023-06-11 ENCOUNTER — Telehealth: Payer: Self-pay | Admitting: Hematology and Oncology

## 2023-06-11 ENCOUNTER — Telehealth: Payer: Self-pay

## 2023-06-11 ENCOUNTER — Ambulatory Visit (INDEPENDENT_AMBULATORY_CARE_PROVIDER_SITE_OTHER): Payer: 59 | Admitting: Podiatry

## 2023-06-11 ENCOUNTER — Inpatient Hospital Stay: Payer: 59 | Admitting: Hematology and Oncology

## 2023-06-11 DIAGNOSIS — E1142 Type 2 diabetes mellitus with diabetic polyneuropathy: Secondary | ICD-10-CM | POA: Diagnosis not present

## 2023-06-11 NOTE — Progress Notes (Signed)
This patient presents to the office for diabetic foot exam. This patient says there is no pain or discomfort in her feet.  No history of infection or drainage.  This patient presents to the office for foot exam due to having a history of diabetes.  Vascular  Dorsalis pedis and posterior tibial pulses are palpable  B/L.  Capillary return  WNL.  Temperature gradient is  WNL.  Skin turgor  WNL  Sensorium  Senn Weinstein monofilament wire  WNL. Normal tactile sensation.  Nail Exam  Patient has normal nails with no evidence of bacterial or fungal infection.  Orthopedic  Exam  Muscle tone and muscle strength  WNL.  No limitations of motion feet  B/L.  No crepitus or joint effusion noted.  Foot type is unremarkable and digits show no abnormalities.  Mild  HAV  B/L.  Skin  No open lesions.  Normal skin texture and turgor.   Diabetes with no complications  Diabetic foot exam was performed.  There is no evidence of vascular or neurologic pathology.  RTC  1 year.     Helane Gunther DPM

## 2023-06-11 NOTE — Telephone Encounter (Signed)
Rescheduled appointments per room/resource. Patient is aware of the made appointments.

## 2023-06-11 NOTE — Assessment & Plan Note (Deleted)
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  Cycle 7 Herceptin (because of cardiomyopathy, Dr. Gala Romney is watching very closely) Echo 04/08/2023: EF 30 to 35%   Signatera: 01/02/2023: Positive 0.07 MTM/ml Signatera 04/17/2023: Negative   Bone scan 04/19/2023: Increased uptake within the sternum and manubrium similar to before.  Asymmetric uptake right breast   Continue with Herceptin infusions every 3 weeks and I will see the patient every 6 weeks.  Our plan is to obtain CT chest abdomen pelvis in October 2024.

## 2023-06-11 NOTE — Telephone Encounter (Signed)
Called pt this morning to inquire her whereabouts as she was scheduled for labs, MD and infusion today but she has not arrived for labs or MD visit. Upon call, she was tearful and apologetic stating she thought her appt was next week.   Reassured patient and ensured we would be able to r/s her visits. Message sent to scheduler to assist in rescheduling.

## 2023-06-13 ENCOUNTER — Inpatient Hospital Stay: Payer: 59

## 2023-06-13 NOTE — Progress Notes (Signed)
Remote ICD transmission.   

## 2023-06-14 NOTE — Progress Notes (Signed)
Patient Care Team: Merri Brunette, MD as PCP - General (Internal Medicine) Silverio Lay, MD as Attending Physician (Obstetrics and Gynecology) Serena Croissant, MD as Consulting Physician (Hematology and Oncology) Almond Lint, MD as Consulting Physician (General Surgery) Marinus Maw, MD as Consulting Physician (Cardiology) Pershing Proud, RN as Oncology Nurse Navigator Donnelly Angelica, RN as Oncology Nurse Navigator  DIAGNOSIS: No diagnosis found.  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%, PR 0%, HER-2 negative, Ki-67 31%   09/26/2005 - 11/13/2005 Radiation Therapy   Adjuvant radiation therapy   03/18/2008 Initial Biopsy   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   03/22/2008 Surgery   Right breast lumpectomy 0.9 cm DCIS ER 90% PR 98% stage 0   04/11/2008 Procedure   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   05/10/2008 - 07/02/2008 Radiation Therapy   Adjuvant radiation therapy   07/17/2008 - 04/16/2009 Anti-estrogen oral therapy   Femara then switched to tamoxifen which was discontinued in 2010 due to concern about uterine cancer   05/31/2021 Cancer Staging   Staging form: Breast, AJCC 7th Edition - Clinical: Stage IA (rT1c, N0, M0) - Signed by Serena Croissant, MD on 05/31/2021 Stage prefix: Recurrence Laterality: Right Biopsy of metastatic site performed:  No Estrogen receptor status: Positive Progesterone receptor status: Negative HER2 status: Positive   09/12/2021 Cancer Staging   Staging form: Breast, AJCC 7th Edition - Pathologic: Stage IIA (T2, N0, cM0) - Signed by Serena Croissant, MD on 09/12/2021 Estrogen receptor status: Positive Progesterone receptor status: Positive HER2 status: Negative   10/05/2022 Relapse/Recurrence   Palpable lump in the right mastectomy scar: 1.4 x 1.3 x 1.3 cm and a 0.4 cm mass (presence of small residual fluid): Biopsy revealed grade 3 IDC with necrosis ER 90% PR 0% Ki-67 70%, HER2 3+ positive   11/06/2022 Imaging   CT chest/abdomen/pelvis Developing small nodule along the right chest wall lateral to the pectoralis muscle inferior to the battery pack of the patient's defibrillator. A malignant lesion is possible. Recommend further workup such as ultrasound.   No developing other new mass lesion, fluid collection or lymph node enlargement.   Persistent sclerosis diffusely along the sternum.   Fatty liver infiltration.   Left-sided renal stone   Aortic atherosclerosis.  ICD 10 code-I70.0    11/06/2022 Imaging   Intense sternal/manubrial radiotracer uptake corresponding with the expansile lucency and sclerosis seen on CT is compatible with osseous metastatic disease.   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 ER 90%, PR 0%, Ki-67 70%, HER2 3+ positive    02/06/2023 -  Chemotherapy   Patient is on Treatment Plan : BREAST MAINTENANCE Trastuzumab IV (6) or SQ (600) D1 q21d X 11 Cycles     Breast cancer of upper-outer quadrant of right female breast (HCC)  05/23/2021 Relapse/Recurrence   Screening detected right breast mass at 10 o'clock position measuring 1.2 cm, ultrasound-guided biopsy revealed grade 3 IDC ER 20%, PR 0%, HER2 positive 3+, Ki-67 50%   09/04/2021 Surgery   Bilateral mastectomies Left mastectomy: Benign Right mastectomy: Grade 3 IDC with DCIS 3.6 cm, 0/6 lymph nodes negative, ER 20%, PR 0%, HER2 positive by IHC, Ki-67 50%   11/21/2021 - 01/23/2022 Chemotherapy   Patient is on Treatment Plan : BREAST Trastuzumab q21d     04/2022 -  Anti-estrogen oral therapy   Anastrozole daily; changed to Exemestane     CHIEF COMPLIANT:   INTERVAL HISTORY: Jordan Willis is a   ALLERGIES:  is allergic to bactrim [sulfamethoxazole-trimethoprim], aspirin, sulfa antibiotics, and tape.  MEDICATIONS:  Current Outpatient Medications  Medication Sig Dispense Refill   albuterol (VENTOLIN HFA) 108 (90 Base) MCG/ACT inhaler Inhale 2 puffs into the lungs every 6 (six) hours as needed for wheezing or shortness of breath.     b complex vitamins capsule Take 1 capsule by mouth daily.     baclofen (LIORESAL) 10 MG tablet Take 10 mg by mouth as needed for muscle spasms.     bumetanide (BUMEX) 1 MG tablet TAKE 1 TABLET BY MOUTH EVERY DAY 90 tablet 1   cetirizine (ZYRTEC) 10 MG tablet Take 10 mg by mouth in the morning.     Cholecalciferol (VITAMIN D3) 25 MCG (1000 UT) CAPS Take 1,000 Units by mouth in the morning.     diclofenac Sodium (VOLTAREN) 1 % GEL Apply 1 Application topically 4 (four) times daily as needed (pain).     exemestane (AROMASIN) 25 MG tablet Take 1 tablet (25 mg total) by mouth daily after breakfast. 90 tablet 3   FARXIGA 10 MG TABS tablet  TAKE 1 TABLET BY MOUTH EVERY DAY BEFORE BREAKFAST 90 tablet 0   fluticasone (FLONASE) 50 MCG/ACT nasal spray Place 1 spray into both nostrils in the morning.     GLUCOSAMINE-CHONDROITIN PO Take 1 tablet by mouth every evening.      glucose blood (ONETOUCH VERIO) test strip TEST ONCE DAILY AS DIRECTED 90     ibuprofen (ADVIL) 200 MG tablet Take 400 mg by mouth every 6 (six) hours as needed for mild pain or moderate pain.     metFORMIN (GLUCOPHAGE-XR) 500 MG 24 hr tablet Take 500 mg by mouth 2 (two) times daily.     metoprolol succinate (TOPROL-XL) 25 MG 24 hr tablet TAKE 1 TABLET (25 MG TOTAL) BY MOUTH IN THE MORNING. HOLD IF SYSTOLIC BLOOD PRESSURE (TOP BLOOD PRESSURE NUMBER) LESS THAN 100 MMHG OR HEART RATE LESS THAN 60 BPM (PULSE). 90 tablet 3   Multiple Vitamin (MULTIVITAMIN WITH MINERALS) TABS tablet Take 1 tablet by mouth daily. Centrum Silver for Women 50+     nitroGLYCERIN (NITROSTAT) 0.4 MG SL tablet Place 0.4 mg under the tongue every 5 (five) minutes as needed for chest pain.     Olopatadine HCl 0.2 % SOLN Place 1 drop into both eyes daily. Pataday  ondansetron (ZOFRAN) 4 MG tablet Take 1 tablet (4 mg total) by mouth every 6 (six) hours as needed for nausea or vomiting. 20 tablet 0   OneTouch Delica Lancets 33G MISC Apply topically.     oxyCODONE (OXY IR/ROXICODONE) 5 MG immediate release tablet Take 1 tablet (5 mg total) by mouth every 6 (six) hours as needed for severe pain or moderate pain. 30 tablet 0   Probiotic Product (PROBIOTIC DAILY PO) Take 1 capsule by mouth in the morning.     rosuvastatin (CRESTOR) 20 MG tablet Take 20 mg by mouth at bedtime.     sacubitril-valsartan (ENTRESTO) 24-26 MG Take 1 tablet by mouth 2 (two) times daily. 60 tablet 6   sertraline (ZOLOFT) 50 MG tablet Take 50 mg by mouth in the morning.     spironolactone (ALDACTONE) 25 MG tablet TAKE 1 TABLET BY MOUTH EVERY DAY IN THE MORNING 90 tablet 0   No current facility-administered medications for this  visit.    PHYSICAL EXAMINATION: ECOG PERFORMANCE STATUS: {CHL ONC ECOG PS:[phone]}  There were no vitals filed for this visit. There were no vitals filed for this visit.  BREAST:*** No palpable masses or nodules in either right or left breasts. No palpable axillary supraclavicular or infraclavicular adenopathy no breast tenderness or nipple discharge. (exam performed in the presence of a chaperone)  LABORATORY DATA:  I have reviewed the data as listed    Latest Ref Rng & Units 11/26/2022    8:21 AM 09/19/2022    8:33 AM 08/01/2022    8:42 AM  CMP  Glucose 70 - 99 mg/dL 606  301  99   BUN 8 - 23 mg/dL 8  13  11    Creatinine 0.44 - 1.00 mg/dL 6.01  0.93  2.35   Sodium 135 - 145 mmol/L 137  135  139   Potassium 3.5 - 5.1 mmol/L 3.7  4.1  4.3   Chloride 98 - 111 mmol/L 102  98  101   CO2 22 - 32 mmol/L 23  24  26    Calcium 8.9 - 10.3 mg/dL 9.7  57.3  9.8     Lab Results  Component Value Date   WBC 4.7 11/26/2022   HGB 12.9 11/26/2022   HCT 39.4 11/26/2022   MCV 90.0 11/26/2022   PLT 254 11/26/2022   NEUTROABS 2.4 09/19/2022    ASSESSMENT & PLAN:  No problem-specific Assessment & Plan notes found for this encounter.    No orders of the defined types were placed in this encounter.  The patient has a good understanding of the overall plan. she agrees with it. she will call with any problems that may develop before the next visit here. Total time spent: 30 mins including face to face time and time spent for planning, charting and co-ordination of care   Sherlyn Lick, CMA 06/14/23    I Janan Ridge am acting as a Neurosurgeon for [employer]  ***

## 2023-06-17 ENCOUNTER — Inpatient Hospital Stay: Payer: 59

## 2023-06-17 ENCOUNTER — Inpatient Hospital Stay (HOSPITAL_BASED_OUTPATIENT_CLINIC_OR_DEPARTMENT_OTHER): Payer: 59 | Admitting: Hematology and Oncology

## 2023-06-17 VITALS — BP 120/54 | HR 73 | Temp 98.0°F | Resp 18 | Ht 67.5 in | Wt 186.7 lb

## 2023-06-17 DIAGNOSIS — Z803 Family history of malignant neoplasm of breast: Secondary | ICD-10-CM | POA: Diagnosis not present

## 2023-06-17 DIAGNOSIS — Z79811 Long term (current) use of aromatase inhibitors: Secondary | ICD-10-CM | POA: Diagnosis not present

## 2023-06-17 DIAGNOSIS — Z171 Estrogen receptor negative status [ER-]: Secondary | ICD-10-CM

## 2023-06-17 DIAGNOSIS — R519 Headache, unspecified: Secondary | ICD-10-CM | POA: Diagnosis not present

## 2023-06-17 DIAGNOSIS — Z9013 Acquired absence of bilateral breasts and nipples: Secondary | ICD-10-CM | POA: Diagnosis not present

## 2023-06-17 DIAGNOSIS — C50412 Malignant neoplasm of upper-outer quadrant of left female breast: Secondary | ICD-10-CM | POA: Diagnosis not present

## 2023-06-17 DIAGNOSIS — Z923 Personal history of irradiation: Secondary | ICD-10-CM | POA: Diagnosis not present

## 2023-06-17 LAB — CMP (CANCER CENTER ONLY)
ALT: 25 U/L (ref 0–44)
AST: 21 U/L (ref 15–41)
Albumin: 4.7 g/dL (ref 3.5–5.0)
Alkaline Phosphatase: 129 U/L — ABNORMAL HIGH (ref 38–126)
Anion gap: 8 (ref 5–15)
BUN: 14 mg/dL (ref 8–23)
CO2: 28 mmol/L (ref 22–32)
Calcium: 9.7 mg/dL (ref 8.9–10.3)
Chloride: 103 mmol/L (ref 98–111)
Creatinine: 0.7 mg/dL (ref 0.44–1.00)
GFR, Estimated: >60 mL/min (ref >60)
Glucose, Bld: 126 mg/dL — ABNORMAL HIGH (ref 70–99)
Potassium: 3.4 mmol/L — ABNORMAL LOW (ref 3.5–5.1)
Sodium: 139 mmol/L (ref 135–145)
Total Bilirubin: 0.5 mg/dL (ref 0.3–1.2)
Total Protein: 7.8 g/dL (ref 6.5–8.1)

## 2023-06-17 LAB — CBC WITH DIFFERENTIAL (CANCER CENTER ONLY)
Abs Immature Granulocytes: 0.02 10*3/uL (ref 0.00–0.07)
Basophils Absolute: 0 10*3/uL (ref 0.0–0.1)
Basophils Relative: 0 %
Eosinophils Absolute: 0.2 10*3/uL (ref 0.0–0.5)
Eosinophils Relative: 4 %
HCT: 37.4 % (ref 36.0–46.0)
Hemoglobin: 12.3 g/dL (ref 12.0–15.0)
Immature Granulocytes: 0 %
Lymphocytes Relative: 33 %
Lymphs Abs: 1.7 10*3/uL (ref 0.7–4.0)
MCH: 29.6 pg (ref 26.0–34.0)
MCHC: 32.9 g/dL (ref 30.0–36.0)
MCV: 90.1 fL (ref 80.0–100.0)
Monocytes Absolute: 0.4 10*3/uL (ref 0.1–1.0)
Monocytes Relative: 8 %
Neutro Abs: 2.8 10*3/uL (ref 1.7–7.7)
Neutrophils Relative %: 55 %
Platelet Count: 275 10*3/uL (ref 150–400)
RBC: 4.15 MIL/uL (ref 3.87–5.11)
RDW: 12.6 % (ref 11.5–15.5)
WBC Count: 5.1 10*3/uL (ref 4.0–10.5)
nRBC: 0 % (ref 0.0–0.2)

## 2023-06-17 MED ORDER — ACETAMINOPHEN 325 MG PO TABS
650.0000 mg | ORAL_TABLET | Freq: Once | ORAL | Status: AC
  Administered 2023-06-17: 650 mg via ORAL
  Filled 2023-06-17: qty 2

## 2023-06-17 MED ORDER — DIPHENHYDRAMINE HCL 25 MG PO CAPS
25.0000 mg | ORAL_CAPSULE | Freq: Once | ORAL | Status: AC
  Administered 2023-06-17: 25 mg via ORAL
  Filled 2023-06-17: qty 1

## 2023-06-17 MED ORDER — TRASTUZUMAB-HYALURONIDASE-OYSK 600-10000 MG-UNT/5ML ~~LOC~~ SOLN
600.0000 mg | Freq: Once | SUBCUTANEOUS | Status: AC
  Administered 2023-06-17: 600 mg via SUBCUTANEOUS
  Filled 2023-06-17: qty 5

## 2023-06-17 NOTE — Assessment & Plan Note (Addendum)
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  Cycle 7 Herceptin (because of cardiomyopathy, Dr. Gala Romney is watching very closely)   Signatera: 01/02/2023: Positive 0.07 MTM/ml Signatera 04/17/2023: Negative   Bone scan 04/19/2023: Increased uptake within the sternum and manubrium similar to before.  Asymmetric uptake right breast   Continue with Herceptin infusions every 3 weeks and I will see the patient every 6 weeks.  Our plan is to obtain a bone scan before the next follow-up in 6 weeks

## 2023-06-17 NOTE — Progress Notes (Signed)
Ok to treat with EFR of 30-35 % per Dr. Pamelia Hoit. He is aware.

## 2023-06-19 ENCOUNTER — Ambulatory Visit: Payer: 59 | Admitting: Physical Therapy

## 2023-06-19 ENCOUNTER — Encounter: Payer: Self-pay | Admitting: Physical Therapy

## 2023-06-19 DIAGNOSIS — Z483 Aftercare following surgery for neoplasm: Secondary | ICD-10-CM | POA: Diagnosis not present

## 2023-06-19 DIAGNOSIS — M25611 Stiffness of right shoulder, not elsewhere classified: Secondary | ICD-10-CM | POA: Diagnosis not present

## 2023-06-19 DIAGNOSIS — Z9189 Other specified personal risk factors, not elsewhere classified: Secondary | ICD-10-CM | POA: Diagnosis not present

## 2023-06-19 DIAGNOSIS — Z9013 Acquired absence of bilateral breasts and nipples: Secondary | ICD-10-CM

## 2023-06-19 DIAGNOSIS — M25612 Stiffness of left shoulder, not elsewhere classified: Secondary | ICD-10-CM | POA: Diagnosis not present

## 2023-06-19 NOTE — Therapy (Signed)
Patient Details  Name: ABAIGEAL BAAB MRN: [id number] Date of Birth: 03-Mar-1951 Referring Provider:  Santiago Glad, MD  Encounter Date: 06/19/2023   OUTPATIENT PHYSICAL THERAPY ONCOLOGY TREATMENT  Patient Name: Jordan Willis MRN: [id number] DOB:May 10, 1951, 72 y.o., female Today's Date: 06/19/2023  END OF SESSION:  PT End of Session - 06/19/23 1228     Visit Number 11    Number of Visits 18    Date for PT Re-Evaluation 07/08/23    PT Start Time 1228   pt arrived late   PT Stop Time 1254    PT Time Calculation (min) 26 min    Activity Tolerance Patient tolerated treatment well    Behavior During Therapy [facility] for tasks assessed/performed               Past Medical History:  Diagnosis Date   AICD (automatic cardioverter/defibrillator) present    Anemia    Anginal pain (HCC)    Anxiety    Arthritis    Lumbar spine DDD.   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   Diabetes mellitus without complication (HCC)    Type II   Dyspnea    When patient gets sick uses Pro-Air inhaler   Frequent sinus infections    GERD (gastroesophageal reflux disease)    occ   Goiter    History of cardiomegaly    History of cataract    Bilateral   History of kidney stones    08/28/2018 currently has a large kidney stone, asymptomatic at this time   History of migraine    History of uterine fibroid    History of uterine prolapse    Hyperlipidemia    Hypertension    LBBB (left bundle branch block)    Menopause 03/03/2012   Nasal turbinate hypertrophy 11/29/2016   Left inferior    Personal history of chemotherapy    Personal history of radiation therapy    Pneumonia    as a child   PONV (postoperative nausea and vomiting)    Sinusitis 2014   Sleep apnea    No CPAP   SUI (stress urinary incontinence,  female)    SVD (spontaneous vaginal delivery)    x 2   Thyroid nodule    Past Surgical History:  Procedure Laterality Date   ABDOMINAL HYSTERECTOMY     BILATERAL SALPINGOOPHORECTOMY Bilateral    BIV ICD INSERTION CRT-D N/A 08/24/2022   Procedure: BIV ICD INSERTION CRT-D;  Surgeon: Marinus Maw, MD;  Location: [facility] INVASIVE CV LAB;  Service: Cardiovascular;  Laterality: N/A;   BLADDER SUSPENSION N/A 05/16/2017   Procedure: TRANSVAGINAL TAPE (TVT) PROCEDURE;  Surgeon: Osborn Coho, MD;  Location: [facility] ORS;  Service: Gynecology;  Laterality: N/A;   BREAST BIOPSY Right 2009   BREAST IMPLANT REMOVAL Bilateral 02/05/2022   Procedure: Removal bilateral breast implants;  Surgeon: Allena Napoleon, MD;  Location: [facility] OR;  Service: Plastics;  Laterality: Bilateral;   BREAST LUMPECTOMY Right 2009   BREAST LUMPECTOMY Left 2006   BREAST RECONSTRUCTION WITH PLACEMENT OF TISSUE EXPANDER AND FLEX HD (ACELLULAR HYDRATED DERMIS) Bilateral 09/04/2021   Procedure: BILATERAL BREAST RECONSTRUCTION WITH PLACEMENT OF TISSUE EXPANDER AND FLEX HD (ACELLULAR HYDRATED DERMIS);  Surgeon: Allena Napoleon, MD;  Location: [facility] OR;  Service: Plastics;  Laterality: Bilateral;  CARDIAC CATHETERIZATION     CATARACT EXTRACTION Left 10/23/2017   CATARACT EXTRACTION Right 11/06/2017   COLONOSCOPY  10/22/2009   Normal.  Repeat 5 years.  [name]   COLONOSCOPY WITH PROPOFOL N/A 12/21/2022   Procedure: COLONOSCOPY WITH PROPOFOL;  Surgeon: Jeani Hawking, MD;  Location: [facility] ENDOSCOPY;  Service: Gastroenterology;  Laterality: N/A;   CYSTOSCOPY N/A 05/16/2017   Procedure: CYSTOSCOPY;  Surgeon: Osborn Coho, MD;  Location: [facility] ORS;  Service: Gynecology;  Laterality: N/A;   DEBRIDEMENT AND CLOSURE WOUND Bilateral 10/03/2021   Procedure: Debridement bilateral mastectomy flaps;  Surgeon: Allena Napoleon, MD;  Location: [facility] OR;  Service: Plastics;  Laterality: Bilateral;  1.5 hour   EYE SURGERY     bilateral cataracts   LEFT HEART CATH  AND CORONARY ANGIOGRAPHY N/A 02/09/2020   Procedure: LEFT HEART CATH AND CORONARY ANGIOGRAPHY;  Surgeon: Elder Negus, MD;  Location: [facility] INVASIVE CV LAB;  Service: Cardiovascular;  Laterality: N/A;   LEFT HEART CATH AND CORONARY ANGIOGRAPHY N/A 03/27/2022   Procedure: LEFT HEART CATH AND CORONARY ANGIOGRAPHY;  Surgeon: Yates Decamp, MD;  Location: [facility] INVASIVE CV LAB;  Service: Cardiovascular;  Laterality: N/A;   MASS EXCISION Right 12/04/2022   Procedure: EXCISION OF RIGHT CHEST WALL MASS;  Surgeon: Almond Lint, MD;  Location: [facility] OR;  Service: General;  Laterality: Right;   MASTECTOMY W/ SENTINEL NODE BIOPSY Right 09/04/2021   Procedure: BILATERAL MASTECTOMIES WITH RIGHT SENTINEL LYMPH NODE BIOPSY;  Surgeon: Almond Lint, MD;  Location: [facility] OR;  Service: General;  Laterality: Right;   REMOVAL OF BILATERAL TISSUE EXPANDERS WITH PLACEMENT OF BILATERAL BREAST IMPLANTS Bilateral 10/03/2021   Procedure: REMOVAL OF BILATERAL TISSUE EXPANDERS WITH REPLACEMENT OF TISSUE EXPANDERS;  Surgeon: Allena Napoleon, MD;  Location: [facility] OR;  Service: Plastics;  Laterality: Bilateral;   REMOVAL OF BILATERAL TISSUE EXPANDERS WITH PLACEMENT OF BILATERAL BREAST IMPLANTS Bilateral 12/19/2021   Procedure: REMOVAL OF BILATERAL TISSUE EXPANDERS WITH PLACEMENT OF BILATERAL BREAST IMPLANTS;  Surgeon: Allena Napoleon, MD;  Location: [facility] OR;  Service: Plastics;  Laterality: Bilateral;   ROBOTIC ASSISTED TOTAL HYSTERECTOMY N/A 05/16/2017   Procedure: ROBOTIC ASSISTED TOTAL HYSTERECTOMY;  Surgeon: Silverio Lay, MD;  Location: [facility] ORS;  Service: Gynecology;  Laterality: N/A;   ROTATOR CUFF REPAIR Bilateral    SCAR REVISION N/A 12/04/2022   Procedure: REVISION OF MASTECTOMY SCAR;  Surgeon: Almond Lint, MD;  Location: [facility] OR;  Service: General;  Laterality: N/A;   SHOULDER ARTHROSCOPY WITH ROTATOR CUFF REPAIR AND SUBACROMIAL DECOMPRESSION Right 09/03/2018   Procedure: Right shoulder manipulation under anesthesia, exam under  anesthesia, mini open rotator cuff repair, subacromial decompression;  Surgeon: Jene Every, MD;  Location: [facility] ORS;  Service: Orthopedics;  Laterality: Right;  90 mins   TUBAL LIGATION     WISDOM TOOTH EXTRACTION     Patient Active Problem List   Diagnosis Date Noted   ICD (implantable cardioverter-defibrillator) in place 12/25/2022   Bradycardia 08/24/2022   BRCA2 gene mutation positive in female 07/30/2022   LBBB (left bundle branch block)    Chronic diastolic heart failure (HCC)    NICM (nonischemic cardiomyopathy) (HCC)    Breast cancer of upper-outer quadrant of right female breast (HCC) 09/04/2021   Diabetic neuropathy (HCC) 02/08/2021   Angina pectoris (HCC) 02/09/2020   Abnormal stress test 02/09/2020   Complete rotator cuff tear 09/03/2018   Pelvic prolapse 05/16/2017   Intractable chronic post-traumatic headache 11/19/2016   Malignant neoplasm of upper-outer quadrant of left breast in female,  estrogen receptor negative (HCC) 03/03/2012   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   Menopause 03/03/2012    PCP: Merri Brunette, MD  REFERRING PROVIDER: Santiago Glad, MD  REFERRING DIAG: breast cancer, stiffness in shoulders  Z98.890 (ICD-10-CM) - S/P scar revision THERAPY DIAG:  History of bilateral mastectomy  Aftercare following surgery for neoplasm  At risk for lymphedema  Stiffness of left shoulder, not elsewhere classified  Stiffness of right shoulder, not elsewhere classified  ONSET DATE:  February 2024   Rationale for Evaluation and Treatment: Rehabilitation  SUBJECTIVE:                                                                                                                                                                                           SUBJECTIVE STATEMENT I am not having the drainage anymore. I think I must have over done it.   PERTINENT HISTORY: Pt was diagnosed with right breast cancer dx 05/2021. She has BRCA2  genetic mutation and has had left breast cancer in 2006 with chemo/breast conservation and XRT. She then had right breast cancer (dcis) with lumpectomy and radiation only. Dr. Lurene Shadow was her surgeon. She presented this time with a palpable painful mass in the upper outer right breast. Dx imaging showed a 1.2 cm mass at 10 o'clock. 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 The cardiomyopathy recovered, but the neuropathy did not. Her sister and mother have both had breast cancer. Pt underwent bilateral mastectomies with right sentinel node biopsy and expander reconstruction (Pace) 09/04/21. Final path at that time was [id number] with 6 negative nodes and negative margins. Patient ended up having the expanders removed because of continued drainage and discomfort.  2.14.2024 : revision of left mastectomy scar and removal of mass on right lateral mastectomy scar. She was found to have a nodule there and diagnostic imaging was performed. Ultrasound showed a 1.3 cm irregular mass at 9:00. Core needle biopsy was performed and this demonstrated invasive ductal carcinoma, grade 3. This was weakly ER positive, PR negative, HER2 positive, Ki-67 70%. She is currently undergoing chemotherapy   Pt also has a pacemaker  and defibrillator.  She has a decrease ejection fraction of 30% likely exacerbated by the chemotherapy.    Review of Systems:  PAIN:   PAIN:  Are you having pain? yes PAIN:  Are you having pain? Yes NPRS scale: 6/10 left chest Pain location: left chest Pain orientation: Left  PAIN TYPE: sharp Pain description: intermittent  Aggravating factors: just comes on Relieving factors: spasm  pill, massage    PRECAUTIONS: Fall and Other: at risk for lymphedema, cardiac precautions   WEIGHT BEARING RESTRICTIONS: No  FALLS:  Has patient fallen in last 6 months? No  LIVING ENVIRONMENT: Lives with:  lives with their family OCCUPATION: does not work   LEISURE: wants to go to [employer] she can only walk one block before she fatigues   HAND DOMINANCE: right   PRIOR LEVEL OF FUNCTION: Independent though limited by fatigue   PATIENT GOALS: to get things back on the top shelf of her cabinets    OBJECTIVE:  COGNITION: Overall cognitive status: Within functional limits for tasks assessed   PALPATION: Tight limited skin excursion across both sides of her chest with limited scar mobility   OBSERVATIONS / OTHER ASSESSMENTS: pt with visible tightness at chest with contraction of scars and fullness above scars    POSTURE: forward head rounded shoulders   UPPER EXTREMITY AROM/PROM:  A/PROM RIGHT   eval  RIGHT 05/09/2023 RIGHT 06/10/2023  Shoulder extension 20 46   Shoulder flexion 110 138 136  Shoulder abduction 90 130 135  Shoulder internal rotation 20 with pain  40   Shoulder external rotation 90 97     (Blank rows = not tested)  A/PROM LEFT   eval LEFT 05/09/2023 LEFT 06/10/2023  Shoulder extension 20 45   Shoulder flexion 120 135 136  Shoulder abduction 95 130 136  Shoulder internal rotation 5 with pain  40   Shoulder external rotation 90 102     (Blank rows = not tested)  UPPER EXTREMITY STRENGTH: less that 3/5 as pt is not able to lift arms to end range of her available range of motion, diffuse muscle atrophy visible  LYMPHEDEMA ASSESSMENTS:  SURGERY TYPE/DATE: multiple surgeries 2006, 2022, 2024 NUMBER OF LYMPH NODES REMOVED: 6 CHEMOTHERAPY: yes, ongoing  RADIATION:yes HORMONE TREATMENT: yes  INFECTIONS: no  LYMPHEDEMA ASSESSMENTS:   LANDMARK RIGHT  eval  10 cm proximal to olecranon process 35  Olecranon process 28  10 cm proximal to ulnar styloid process 22.7  Just proximal to ulnar styloid process 17.5  Across hand at thumb web space 20  At base of 2nd digit 6.5  (Blank rows = not tested)  LANDMARK LEFT  eval  10 cm proximal to olecranon  process 35  Olecranon process 27.5  10 cm proximal to ulnar styloid process 21.5  Just proximal to ulnar styloid process 17.5  Across hand at thumb web space 20  At base of 2nd digit 6.5  (Blank rows = not tested)  FUNCTIONAL TESTS:  6 reps in  30 seconds chair stand test  " pt very slow   QUICK DASH SURVEY: 45.45  TODAY'S TREATMENT:  DATE:  06/19/23 Shortened tx session due to pt arriving late PROM in supine to bilateral shoulders in to flexion, scaption and D2  Gentle STM/MFR to bilateral chest over tight musculature avoiding pacemaker on R chest  06/10/2023 Held Nustep and standing exs as pt still not feeling very well. Left chest intermittent sharp pain today  and with bad HA  STM to bil lateral chest wall/lateral trunk/UT over palpably tight musculature being mindful of pacemaker on Rt chest,  P/ROM in supine to bil shoulders into flex, scaption and D2 , and ER with scapular depression throughout Measured AROM and checked goals for recert  06/05/2023 Held exs due to recovering from fever and not feeling great today STM to bil lateral chest wall/trunk/UTover palpably tight musculature being mindful of pacemaker on Rt chest, and to right UT/scapular area in SL P/ROM in supine to bil shoulders into flex, scaption and D2 , and ER with scapular depression throughout Supine bilateral AROM flexion, scaption, horizontal abd x 5 Treatment decreased due to 2 bathroom breaks 05/29/23: Therapeutic Exercises NuStep level 5 x 1:45, level 4 for remaining time up to 6 mins, 430 steps; pt reports being challenged by this  Standing in // bars for bil hip 3 way raises x 10 each, pt returned therapist demo for each and VC's during for corrected LE position and to decrease trunk and pelvis compensations Supine Scapular series with red theraband x 10 each returning  therapist demo for each; pt reports her arms felt tired after these so issued yellow for her to use initially at home  Manual Therapy STM to bil lateral chest wall/trunk/UT over palpably tight musculature being mindful of pacemaker on Rt chest, and to right UT/scapular area in SL P/ROM in supine to bil shoulders into flex, scaption and D2 with scapular depression throughout  05/09/2023 STM to bil lateral chest wall/trunk/UTover palpably tight musculature being mindful of pacemaker on Rt chest, and to right UT/scapular area in SL P/ROM in supine to bil shoulders into flex, scaption and D2 , and ER with scapular depression throughout Supine bilateral AROM flexion, scaption, ABD Measured AROM Jobes flexion x 5   05/06/2023 Pulleys x 2 min flex and abd Ball rolls on wall x 10 Supine bilateral UE AROM flexion, scaption, horizntal abd x 5 STM to bil lateral chest wall/trunk/UTover palpably tight musculature being mindful of pacemaker on Rt chest, and to right UT/scapular area in SL MFR to left chest region and right  chest avoiding pacemaker Supine wand flexion and scaption x 5 P/ROM in supine to bil shoulders into flex, scaption and D2 , and ER with scapular depression throughout  05/01/23 STM to bil lateral chest wall/trunk/UTover palpably tight musculature being mindful of pacemaker on Rt chest, and to right UT/scapular area in SL Supine wand flexion and scaption x 5 P/ROM in supine to bil shoulders into flex, scaption and D2 , and ER with scapular depression throughout  04/29/23: Therapeutic Exercises Pulleys into flex and scaption x 2 mins each Roll yellow ball up wall into flexion x 10 and bil abd x 5 each returning therapist demo Supine over half foam for following: Bil UE horz abd, bil UE scaption into a "V" x 10 each; and then bil abd "snow angel" x 5, 5 sec holds  Manual Therapy P/ROM in supine to bil shoulders into flex, scaption and D2 with scapular depression throughout STM to bil  lateral chest wall/trunk over palpably tight musculature being mindful of pacemaker on Rt chest  04/18/23: Manual Therapy P/ROM in supine to bil shoulders into flex, scaption and D2 with scapular depression throughout STM to bil lateral chest wall/trunk over palpably tight musculature Therapeutic Exercises Pulleys into flex and scaption x 2 mins each returning therapist demo and VC's during to decr scapular compensation Lt UE NTS on wall as pt reports this UE tingles when she tries to lay on it at night; encouraged to do this only about 5 reps at a time and to not overstretch the nerve.  Roll yellow ball on wall for flexion x 10 returning therapist demo and VC's with demo to decr scap compensation throughout. Pt able to do this with cuing   04/15/23: In supine PROM to bilateral shoulders in to flexion, abduction and ER with increased tightness bilaterally and discomfort. Pt guarded throughout. At end of session pt had a spasm in her R pec that eased with STM and stretching. Instructed pt in seated pec stretch and encouraged her to try doorway or corner stretch at home as long as no sharp pain  04/09/2023 evaluation   PATIENT EDUCATION:  Education details: Standing bil hip 3 way raises and supine scapular series Person educated: Patient Education method: Explanation, demonstration, verbal cues, handout issued Education comprehension: verbalized understanding, returned demo, VC's, will benefit from further review  HOME EXERCISE PROGRAM: Pt encouraged to do the broomstick exercises and shoulder exercises she has previously been doing  Doorway or corner stretch Standing bil LE hip 3 way raises and supine scapular series  ASSESSMENT:  CLINICAL IMPRESSION: Pt arrived late to her appointment so she had a shortened treatment session. Focused on annual therapy to improve bilateral shoulder ROM and decrease tightness. Pt's PROM was much improved by end of session and pt reported increased comfort.  Educated pt to try self massage over tight areas at home.   OBJECTIVE IMPAIRMENTS: cardiopulmonary status limiting activity, decreased activity tolerance, decreased endurance, decreased ROM, decreased strength, increased fascial restrictions, impaired perceived functional ability, impaired UE functional use, and postural dysfunction.   ACTIVITY LIMITATIONS: carrying, lifting, standing, reach over head, and locomotion level  PARTICIPATION LIMITATIONS: meal prep, cleaning, laundry, and yard work  PERSONAL FACTORS: Age, Past/current experiences, and 3+ comorbidities: previous surgery, decreased cardiac status, onling chemotherapy   are also affecting patient's functional outcome.   REHAB POTENTIAL: Good  CLINICAL DECISION MAKING: Stable/uncomplicated  EVALUATION COMPLEXITY: Low  GOALS: Goals reviewed with patient? Yes  SHORT TERM GOALS: Target date: 05/09/2023  Pt will be independent in a basic exercise program for shoulder ROM and Strenth  Baseline:needs review Goal status:MET 06/10/2023 LONG TERM GOALS: Target date: 8/18.2024  Pt will decrease quick DASH score to < 20 indicating an improvement in functional use of her UE's Baseline: 45.45 Goal status: ONGOING  2.  Pt will have increased shoulder flextion of >  135 degrees in both shoulders so that she can use her arms better at home  Baseline: 110 on right and 120 on left  Goal status: Right MET 136, Left ongoing 135 today 06/10/2023  3.  Pt will report that she can put things on the top shelf of her cabinets at home without using a stepstool  Baseline: unable  Goal status: ONGOING, Hasn't tried much 4.  Pt will increase to 10 reps of sit to stand in 30 seconds indicating an improvement in activity tolerance and functional strength  Baseline: 6 Goal status: Ongoing, did not test today due to not feeling well 06/10/23  PLAN:  PT FREQUENCY: 1-2x/week  PT  DURATION:4 weeks PLANNED INTERVENTIONS: Therapeutic exercises, Therapeutic  activity, Neuromuscular re-education, Balance training, Gait training, Patient/Family education, Self Care, Joint mobilization, scar mobilization, and Manual therapy  PLAN FOR NEXT SESSION: Cont manual work for soft tissue and scar mobilization, passive and A/AA/ROM excise for shoulders; add strength, Cont nustep and functional endurance exercise   Leonette Most, PT 06/19/2023, 1:00 PM   [facility] [address] [place], [place], [address] Phone: [phone]   Fax:  [phone]         Cancer Rehab [phone] HIP: Flexion Standing    Holding onto counter lift leg straight in front keeping knee straight. Slow and controlled! _10__ reps per set, _2-3__ sets per day. Then repeat with other leg.   Hip Extension (Standing)    Stand with support at counter. Squeeze pelvic floor and hold so as not to twist hips and don't lean forward.  Move right leg backward with straight knee. Slow and controlled! Repeat _10__ times. Do _2-3__ times a day. Repeat with other leg.  Hip Abduction (Standing)    Stand with support. Squeeze pelvic floor and hold. Lift right leg out to side, keeping toe forward.  Repeat _10__ times. Do _2-3__ times a day. Repeat with other leg.    Over Head Pull: Narrow and Wide Grip   Start with yellow band for all then progress to red once this becomes easier.   On back, knees bent, feet flat, band across thighs, elbows straight but relaxed. Pull hands apart (start). Keeping elbows straight, bring arms up and over head, hands toward floor. Keep pull steady on band. Hold momentarily. Return slowly, keeping pull steady, back to start. Then do same with a wider grip on the band (past shoulder width) Repeat _5-10__ times. Band color __red____   Side Pull: Double Arm   On back, knees bent, feet flat. Arms perpendicular to body, shoulder level, elbows straight but relaxed. Pull arms out to sides, elbows straight. Resistance  band comes across collarbones, hands toward floor. Hold momentarily. Slowly return to starting position. Repeat _5-10__ times. Band color _red____   Sword   On back, knees bent, feet flat, left hand on left hip, right hand above left. Pull right arm DIAGONALLY (hip to shoulder) across chest. Bring right arm along head toward floor. Hold momentarily. Slowly return to starting position. Repeat _5-10__ times. Do with left arm. Band color _red_____   Shoulder Rotation: Double Arm   On back, knees bent, feet flat, elbows tucked at sides, bent 90, hands palms up. Pull hands apart and down toward floor, keeping elbows near sides. Hold momentarily. Slowly return to starting position. Repeat _5-10__ times. Band color __red____

## 2023-06-20 ENCOUNTER — Telehealth: Payer: Self-pay | Admitting: Internal Medicine

## 2023-06-20 DIAGNOSIS — I959 Hypotension, unspecified: Secondary | ICD-10-CM | POA: Diagnosis not present

## 2023-06-20 DIAGNOSIS — I501 Left ventricular failure: Secondary | ICD-10-CM | POA: Diagnosis not present

## 2023-06-20 DIAGNOSIS — R002 Palpitations: Secondary | ICD-10-CM | POA: Diagnosis not present

## 2023-06-20 NOTE — Telephone Encounter (Signed)
Pt c/o BP issue: STAT if pt c/o blurred vision, one-sided weakness or slurred speech  1. What are your last 5 BP readings? 89/56: 133/64  2. Are you having any other symptoms (ex. Dizziness, headache, blurred vision, passed out)? Dizziness, headache   3. What is your BP issue?

## 2023-06-20 NOTE — Telephone Encounter (Signed)
Spoke with the patient who states that she is seeing her PCP this afternoon in regards to her blood pressure. She is going to bring her cuff with her to that appointment and have them check it to make sure that it is accurate.

## 2023-06-26 ENCOUNTER — Ambulatory Visit: Payer: 59

## 2023-06-27 DIAGNOSIS — R748 Abnormal levels of other serum enzymes: Secondary | ICD-10-CM | POA: Diagnosis not present

## 2023-06-28 ENCOUNTER — Ambulatory Visit: Payer: 59 | Attending: Plastic Surgery

## 2023-06-28 DIAGNOSIS — M25611 Stiffness of right shoulder, not elsewhere classified: Secondary | ICD-10-CM | POA: Insufficient documentation

## 2023-06-28 DIAGNOSIS — Z9013 Acquired absence of bilateral breasts and nipples: Secondary | ICD-10-CM | POA: Diagnosis not present

## 2023-06-28 DIAGNOSIS — M25612 Stiffness of left shoulder, not elsewhere classified: Secondary | ICD-10-CM | POA: Insufficient documentation

## 2023-06-28 DIAGNOSIS — Z9189 Other specified personal risk factors, not elsewhere classified: Secondary | ICD-10-CM | POA: Insufficient documentation

## 2023-06-28 DIAGNOSIS — Z483 Aftercare following surgery for neoplasm: Secondary | ICD-10-CM | POA: Insufficient documentation

## 2023-06-28 NOTE — Therapy (Signed)
Patient Details  Name: Jordan Willis MRN: [id number] Date of Birth: 23-May-1951 Referring Provider:  Santiago Glad, MD  Encounter Date: 06/28/2023   OUTPATIENT PHYSICAL THERAPY ONCOLOGY TREATMENT  Patient Name: Jordan Willis MRN: [id number] DOB:[birth year], 72 y.o., female Today's Date: 06/28/2023  END OF SESSION:  PT End of Session - 06/28/23 0802     Visit Number 12    Number of Visits 18    Date for PT Re-Evaluation 07/08/23    PT Start Time 0803    PT Stop Time 0850    PT Time Calculation (min) 47 min    Activity Tolerance Patient tolerated treatment well    Behavior During Therapy [facility] for tasks assessed/performed               Past Medical History:  Diagnosis Date   AICD (automatic cardioverter/defibrillator) present    Anemia    Anginal pain (HCC)    Anxiety    Arthritis    Lumbar spine DDD.   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   Diabetes mellitus without complication (HCC)    Type II   Dyspnea    When patient gets sick uses Pro-Air inhaler   Frequent sinus infections    GERD (gastroesophageal reflux disease)    occ   Goiter    History of cardiomegaly    History of cataract    Bilateral   History of kidney stones    08/28/2018 currently has a large kidney stone, asymptomatic at this time   History of migraine    History of uterine fibroid    History of uterine prolapse    Hyperlipidemia    Hypertension    LBBB (left bundle branch block)    Menopause 03/03/2012   Nasal turbinate hypertrophy 11/29/2016   Left inferior    Personal history of chemotherapy    Personal history of radiation therapy    Pneumonia    as a child   PONV (postoperative nausea and vomiting)    Sinusitis 2014   Sleep apnea    No CPAP   SUI (stress urinary incontinence, female)    SVD  (spontaneous vaginal delivery)    x 2   Thyroid nodule    Past Surgical History:  Procedure Laterality Date   ABDOMINAL HYSTERECTOMY     BILATERAL SALPINGOOPHORECTOMY Bilateral    BIV ICD INSERTION CRT-D N/A 08/24/2022   Procedure: BIV ICD INSERTION CRT-D;  Surgeon: Marinus Maw, MD;  Location: [facility] INVASIVE CV LAB;  Service: Cardiovascular;  Laterality: N/A;   BLADDER SUSPENSION N/A 05/16/2017   Procedure: TRANSVAGINAL TAPE (TVT) PROCEDURE;  Surgeon: Osborn Coho, MD;  Location: [facility] ORS;  Service: Gynecology;  Laterality: N/A;   BREAST BIOPSY Right 2009   BREAST IMPLANT REMOVAL Bilateral 02/05/2022   Procedure: Removal bilateral breast implants;  Surgeon: Allena Napoleon, MD;  Location: [facility] OR;  Service: Plastics;  Laterality: Bilateral;   BREAST LUMPECTOMY Right 2009   BREAST LUMPECTOMY Left 2006   BREAST RECONSTRUCTION WITH PLACEMENT OF TISSUE EXPANDER AND FLEX HD (ACELLULAR HYDRATED DERMIS) Bilateral 09/04/2021   Procedure: BILATERAL BREAST RECONSTRUCTION WITH PLACEMENT OF TISSUE EXPANDER AND FLEX HD (ACELLULAR HYDRATED DERMIS);  Surgeon: Allena Napoleon, MD;  Location: [facility] OR;  Service: Plastics;  Laterality: Bilateral;   CARDIAC CATHETERIZATION  CATARACT EXTRACTION Left 10/23/2017   CATARACT EXTRACTION Right 11/06/2017   COLONOSCOPY  10/22/2009   Normal.  Repeat 5 years.  [name]   COLONOSCOPY WITH PROPOFOL N/A 12/21/2022   Procedure: COLONOSCOPY WITH PROPOFOL;  Surgeon: Jeani Hawking, MD;  Location: [facility] ENDOSCOPY;  Service: Gastroenterology;  Laterality: N/A;   CYSTOSCOPY N/A 05/16/2017   Procedure: CYSTOSCOPY;  Surgeon: Osborn Coho, MD;  Location: [facility] ORS;  Service: Gynecology;  Laterality: N/A;   DEBRIDEMENT AND CLOSURE WOUND Bilateral 10/03/2021   Procedure: Debridement bilateral mastectomy flaps;  Surgeon: Allena Napoleon, MD;  Location: [facility] OR;  Service: Plastics;  Laterality: Bilateral;  1.5 hour   EYE SURGERY     bilateral cataracts   LEFT HEART CATH AND CORONARY  ANGIOGRAPHY N/A 02/09/2020   Procedure: LEFT HEART CATH AND CORONARY ANGIOGRAPHY;  Surgeon: Elder Negus, MD;  Location: [facility] INVASIVE CV LAB;  Service: Cardiovascular;  Laterality: N/A;   LEFT HEART CATH AND CORONARY ANGIOGRAPHY N/A 03/27/2022   Procedure: LEFT HEART CATH AND CORONARY ANGIOGRAPHY;  Surgeon: Yates Decamp, MD;  Location: [facility] INVASIVE CV LAB;  Service: Cardiovascular;  Laterality: N/A;   MASS EXCISION Right 12/04/2022   Procedure: EXCISION OF RIGHT CHEST WALL MASS;  Surgeon: Almond Lint, MD;  Location: [facility] OR;  Service: General;  Laterality: Right;   MASTECTOMY W/ SENTINEL NODE BIOPSY Right 09/04/2021   Procedure: BILATERAL MASTECTOMIES WITH RIGHT SENTINEL LYMPH NODE BIOPSY;  Surgeon: Almond Lint, MD;  Location: [facility] OR;  Service: General;  Laterality: Right;   REMOVAL OF BILATERAL TISSUE EXPANDERS WITH PLACEMENT OF BILATERAL BREAST IMPLANTS Bilateral 10/03/2021   Procedure: REMOVAL OF BILATERAL TISSUE EXPANDERS WITH REPLACEMENT OF TISSUE EXPANDERS;  Surgeon: Allena Napoleon, MD;  Location: [facility] OR;  Service: Plastics;  Laterality: Bilateral;   REMOVAL OF BILATERAL TISSUE EXPANDERS WITH PLACEMENT OF BILATERAL BREAST IMPLANTS Bilateral 12/19/2021   Procedure: REMOVAL OF BILATERAL TISSUE EXPANDERS WITH PLACEMENT OF BILATERAL BREAST IMPLANTS;  Surgeon: Allena Napoleon, MD;  Location: [facility] OR;  Service: Plastics;  Laterality: Bilateral;   ROBOTIC ASSISTED TOTAL HYSTERECTOMY N/A 05/16/2017   Procedure: ROBOTIC ASSISTED TOTAL HYSTERECTOMY;  Surgeon: Silverio Lay, MD;  Location: [facility] ORS;  Service: Gynecology;  Laterality: N/A;   ROTATOR CUFF REPAIR Bilateral    SCAR REVISION N/A 12/04/2022   Procedure: REVISION OF MASTECTOMY SCAR;  Surgeon: Almond Lint, MD;  Location: [facility] OR;  Service: General;  Laterality: N/A;   SHOULDER ARTHROSCOPY WITH ROTATOR CUFF REPAIR AND SUBACROMIAL DECOMPRESSION Right 09/03/2018   Procedure: Right shoulder manipulation under anesthesia, exam under anesthesia, mini  open rotator cuff repair, subacromial decompression;  Surgeon: Jene Every, MD;  Location: [facility] ORS;  Service: Orthopedics;  Laterality: Right;  90 mins   TUBAL LIGATION     WISDOM TOOTH EXTRACTION     Patient Active Problem List   Diagnosis Date Noted   ICD (implantable cardioverter-defibrillator) in place 12/25/2022   Bradycardia 08/24/2022   BRCA2 gene mutation positive in female 07/30/2022   LBBB (left bundle branch block)    Chronic diastolic heart failure (HCC)    NICM (nonischemic cardiomyopathy) (HCC)    Breast cancer of upper-outer quadrant of right female breast (HCC) 09/04/2021   Diabetic neuropathy (HCC) 02/08/2021   Angina pectoris (HCC) 02/09/2020   Abnormal stress test 02/09/2020   Complete rotator cuff tear 09/03/2018   Pelvic prolapse 05/16/2017   Intractable chronic post-traumatic headache 11/19/2016   Malignant neoplasm of upper-outer quadrant of left breast in female, estrogen receptor negative (HCC) 03/03/2012  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   Menopause 03/03/2012    PCP: Merri Brunette, MD  REFERRING PROVIDER: Santiago Glad, MD  REFERRING DIAG: breast cancer, stiffness in shoulders  Z98.890 (ICD-10-CM) - S/P scar revision THERAPY DIAG:  History of bilateral mastectomy  Aftercare following surgery for neoplasm  At risk for lymphedema  Stiffness of left shoulder, not elsewhere classified  Stiffness of right shoulder, not elsewhere classified  ONSET DATE:  February 2024   Rationale for Evaluation and Treatment: Rehabilitation  SUBJECTIVE:                                                                                                                                                                                           SUBJECTIVE STATEMENT I am having to wear a heart monitor and its really itchy. It falls off when I sweat. I took a fluid pill today. I can reach the top shelf now without standing on my tippy  toes  PERTINENT HISTORY: Pt was diagnosed with right breast cancer dx 05/2021. She has BRCA2 genetic mutation and has had left breast cancer in 2006 with chemo/breast conservation and XRT. She then had right breast cancer (dcis) with lumpectomy and radiation only. Dr. Lurene Shadow was her surgeon. She presented this time with a palpable painful mass in the upper outer right breast. Dx imaging showed a 1.2 cm mass at 10 o'clock. 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 The cardiomyopathy recovered, but the neuropathy did not. Her sister and mother have both had breast cancer. Pt underwent bilateral mastectomies with right sentinel node biopsy and expander reconstruction (Pace) 09/04/21. Final path at that time was [id number] with 6 negative nodes and negative margins. Patient ended up having the expanders removed because of continued drainage and discomfort.  2.14.2024 : revision of left mastectomy scar and removal of mass on right lateral mastectomy scar. She was found to have a nodule there and diagnostic imaging was performed. Ultrasound showed a 1.3 cm irregular mass at 9:00. Core needle biopsy was performed and this demonstrated invasive ductal carcinoma, grade 3. This was weakly ER positive, PR negative, HER2 positive, Ki-67 70%. She is currently undergoing chemotherapy   Pt also has a pacemaker  and defibrillator.  She has a decrease ejection fraction of 30% likely exacerbated by the chemotherapy.    Review of Systems:  PAIN:   PAIN:  Are you having pain? yes PAIN:  Are you having pain? Yes NPRS scale: 5/10 left chest, UT's Pain location: left chest Pain orientation: Left  PAIN TYPE: sharp Pain description: intermittent  Aggravating factors: just comes on Relieving factors: spasm pill, massage    PRECAUTIONS: Fall and Other: at risk for lymphedema, cardiac precautions   WEIGHT BEARING  RESTRICTIONS: No  FALLS:  Has patient fallen in last 6 months? No  LIVING ENVIRONMENT: Lives with: lives with their family OCCUPATION: does not work   LEISURE: wants to go to [employer] she can only walk one block before she fatigues   HAND DOMINANCE: right   PRIOR LEVEL OF FUNCTION: Independent though limited by fatigue   PATIENT GOALS: to get things back on the top shelf of her cabinets    OBJECTIVE:  COGNITION: Overall cognitive status: Within functional limits for tasks assessed   PALPATION: Tight limited skin excursion across both sides of her chest with limited scar mobility   OBSERVATIONS / OTHER ASSESSMENTS: pt with visible tightness at chest with contraction of scars and fullness above scars    POSTURE: forward head rounded shoulders   UPPER EXTREMITY AROM/PROM:  A/PROM RIGHT   eval  RIGHT 05/09/2023 RIGHT 06/10/2023 RIGHT 06/28/2023  Shoulder extension 20 46    Shoulder flexion 110 138 136 147  Shoulder abduction 90 130 135 154  Shoulder internal rotation 20 with pain  40    Shoulder external rotation 90 97      (Blank rows = not tested)  A/PROM LEFT   eval LEFT 05/09/2023 LEFT 06/10/2023 LEFT 06/28/2023  Shoulder extension 20 45    Shoulder flexion 120 135 136 146  Shoulder abduction 95 130 136 150  Shoulder internal rotation 5 with pain  40    Shoulder external rotation 90 102      (Blank rows = not tested)  UPPER EXTREMITY STRENGTH: less that 3/5 as pt is not able to lift arms to end range of her available range of motion, diffuse muscle atrophy visible  LYMPHEDEMA ASSESSMENTS:  SURGERY TYPE/DATE: multiple surgeries 2006, 2022, 2024 NUMBER OF LYMPH NODES REMOVED: 6 CHEMOTHERAPY: yes, ongoing  RADIATION:yes HORMONE TREATMENT: yes  INFECTIONS: no  LYMPHEDEMA ASSESSMENTS:   LANDMARK RIGHT  eval  10 cm proximal to olecranon process 35  Olecranon process 28  10 cm proximal to ulnar styloid process 22.7  Just proximal to ulnar styloid  process 17.5  Across hand at thumb web space 20  At base of 2nd digit 6.5  (Blank rows = not tested)  LANDMARK LEFT  eval  10 cm proximal to olecranon process 35  Olecranon process 27.5  10 cm proximal to ulnar styloid process 21.5  Just proximal to ulnar styloid process 17.5  Across hand at thumb web space 20  At base of 2nd digit 6.5  (Blank rows = not tested)  FUNCTIONAL TESTS:  6 reps in  30 seconds chair stand test  " pt very slow   QUICK DASH SURVEY: 45.45  TODAY'S TREATMENT:  DATE:  06/28/2023 Nu step Lev 4 seat 9, UE 8 x 5 min, 365 steps Pulleys x 2 min flexion and abd Supine scapular series yellow x 10 ea flexion, ER, Horizontal abd, Measured AROM STM to bilateral UT and neck with cocoa butter PROM in supine to bilateral shoulders in to flexion, scaption and D2 flexion Gentle STM/MFR to bilateral chest over tight musculature avoiding pacemaker on R chest and heart monitor on left chest   06/19/23 Shortened tx session due to pt arriving late PROM in supine to bilateral shoulders in to flexion, scaption and D2  Gentle STM/MFR to bilateral chest over tight musculature avoiding pacemaker on R chest  06/10/2023 Held Nustep and standing exs as pt still not feeling very well. Left chest intermittent sharp pain today  and with bad HA  STM to bil lateral chest wall/lateral trunk/UT over palpably tight musculature being mindful of pacemaker on Rt chest,  P/ROM in supine to bil shoulders into flex, scaption and D2 , and ER with scapular depression throughout Measured AROM and checked goals for recert  06/05/2023 Held exs due to recovering from fever and not feeling great today STM to bil lateral chest wall/trunk/UTover palpably tight musculature being mindful of pacemaker on Rt chest, and to right UT/scapular area in SL P/ROM in supine to bil  shoulders into flex, scaption and D2 , and ER with scapular depression throughout Supine bilateral AROM flexion, scaption, horizontal abd x 5 Treatment decreased due to 2 bathroom breaks 05/29/23: Therapeutic Exercises NuStep level 5 x 1:45, level 4 for remaining time up to 6 mins, 430 steps; pt reports being challenged by this  Standing in // bars for bil hip 3 way raises x 10 each, pt returned therapist demo for each and VC's during for corrected LE position and to decrease trunk and pelvis compensations Supine Scapular series with red theraband x 10 each returning therapist demo for each; pt reports her arms felt tired after these so issued yellow for her to use initially at home  Manual Therapy STM to bil lateral chest wall/trunk/UT over palpably tight musculature being mindful of pacemaker on Rt chest, and to right UT/scapular area in SL P/ROM in supine to bil shoulders into flex, scaption and D2 with scapular depression throughout  05/09/2023 STM to bil lateral chest wall/trunk/UTover palpably tight musculature being mindful of pacemaker on Rt chest, and to right UT/scapular area in SL P/ROM in supine to bil shoulders into flex, scaption and D2 , and ER with scapular depression throughout Supine bilateral AROM flexion, scaption, ABD Measured AROM Jobes flexion x 5   05/06/2023 Pulleys x 2 min flex and abd Ball rolls on wall x 10 Supine bilateral UE AROM flexion, scaption, horizntal abd x 5 STM to bil lateral chest wall/trunk/UTover palpably tight musculature being mindful of pacemaker on Rt chest, and to right UT/scapular area in SL MFR to left chest region and right  chest avoiding pacemaker Supine wand flexion and scaption x 5 P/ROM in supine to bil shoulders into flex, scaption and D2 , and ER with scapular depression throughout  05/01/23 STM to bil lateral chest wall/trunk/UTover palpably tight musculature being mindful of pacemaker on Rt chest, and to right UT/scapular area in  SL Supine wand flexion and scaption x 5 P/ROM in supine to bil shoulders into flex, scaption and D2 , and ER with scapular depression throughout  04/29/23: Therapeutic Exercises Pulleys into flex and scaption x 2 mins each Roll yellow ball up wall into flexion  x 10 and bil abd x 5 each returning therapist demo Supine over half foam for following: Bil UE horz abd, bil UE scaption into a "V" x 10 each; and then bil abd "snow angel" x 5, 5 sec holds  Manual Therapy P/ROM in supine to bil shoulders into flex, scaption and D2 with scapular depression throughout STM to bil lateral chest wall/trunk over palpably tight musculature being mindful of pacemaker on Rt chest  04/18/23: Manual Therapy P/ROM in supine to bil shoulders into flex, scaption and D2 with scapular depression throughout STM to bil lateral chest wall/trunk over palpably tight musculature Therapeutic Exercises Pulleys into flex and scaption x 2 mins each returning therapist demo and VC's during to decr scapular compensation Lt UE NTS on wall as pt reports this UE tingles when she tries to lay on it at night; encouraged to do this only about 5 reps at a time and to not overstretch the nerve.  Roll yellow ball on wall for flexion x 10 returning therapist demo and VC's with demo to decr scap compensation throughout. Pt able to do this with cuing   04/15/23: In supine PROM to bilateral shoulders in to flexion, abduction and ER with increased tightness bilaterally and discomfort. Pt guarded throughout. At end of session pt had a spasm in her R pec that eased with STM and stretching. Instructed pt in seated pec stretch and encouraged her to try doorway or corner stretch at home as long as no sharp pain  04/09/2023 evaluation   PATIENT EDUCATION:  Education details: Standing bil hip 3 way raises and supine scapular series Person educated: Patient Education method: Explanation, demonstration, verbal cues, handout issued Education  comprehension: verbalized understanding, returned demo, VC's, will benefit from further review  HOME EXERCISE PROGRAM: Pt encouraged to do the broomstick exercises and shoulder exercises she has previously been doing  Doorway or corner stretch Standing bil LE hip 3 way raises and supine scapular series  ASSESSMENT:  CLINICAL IMPRESSION: Pt is feeling much better today and it shows. She was able to resume her exercises and she demonstrated excellent improvement in AROM. She felt much looser after treatment and neck pain was improved. She achieved 2 more goals for ROM and reach to cabinet  OBJECTIVE IMPAIRMENTS: cardiopulmonary status limiting activity, decreased activity tolerance, decreased endurance, decreased ROM, decreased strength, increased fascial restrictions, impaired perceived functional ability, impaired UE functional use, and postural dysfunction.   ACTIVITY LIMITATIONS: carrying, lifting, standing, reach over head, and locomotion level  PARTICIPATION LIMITATIONS: meal prep, cleaning, laundry, and yard work  PERSONAL FACTORS: Age, Past/current experiences, and 3+ comorbidities: previous surgery, decreased cardiac status, onling chemotherapy   are also affecting patient's functional outcome.   REHAB POTENTIAL: Good  CLINICAL DECISION MAKING: Stable/uncomplicated  EVALUATION COMPLEXITY: Low  GOALS: Goals reviewed with patient? Yes  SHORT TERM GOALS: Target date: 05/09/2023  Pt will be independent in a basic exercise program for shoulder ROM and Strenth  Baseline:needs review Goal status:MET 06/10/2023 LONG TERM GOALS: Target date: 8/18.2024  Pt will decrease quick DASH score to < 20 indicating an improvement in functional use of her UE's Baseline: 45.45 Goal status: ONGOING  2.  Pt will have increased shoulder flextion of >  135 degrees in both shoulders so that she can use her arms better at home  Baseline: 110 on right and 120 on left  Goal status: Right MET 136,  Left MET 06/27/2022  3.  Pt will report that she can put things on  the top shelf of her cabinets at home without using a stepstool  Baseline: unable  Goal status: MET 06/28/2023  4.  Pt will increase to 10 reps of sit to stand in 30 seconds indicating an improvement in activity tolerance and functional strength  Baseline: 6 Goal status: Ongoing, did not test today due to not feeling well 06/10/23  PLAN:  PT FREQUENCY: 1-2x/week  PT DURATION:4 weeks PLANNED INTERVENTIONS: Therapeutic exercises, Therapeutic activity, Neuromuscular re-education, Balance training, Gait training, Patient/Family education, Self Care, Joint mobilization, scar mobilization, and Manual therapy  PLAN FOR NEXT SESSION: Cont manual work for soft tissue and scar mobilization, passive and A/AA/ROM excise for shoulders; add strength, Cont nustep and functional endurance exercise   Waynette Buttery, PT 06/28/2023, 8:53 AM   [facility] [address] [place], [place], [address] Phone: [phone]   Fax:  [phone]         Cancer Rehab [phone] HIP: Flexion Standing    Holding onto counter lift leg straight in front keeping knee straight. Slow and controlled! _10__ reps per set, _2-3__ sets per day. Then repeat with other leg.   Hip Extension (Standing)    Stand with support at counter. Squeeze pelvic floor and hold so as not to twist hips and don't lean forward.  Move right leg backward with straight knee. Slow and controlled! Repeat _10__ times. Do _2-3__ times a day. Repeat with other leg.  Hip Abduction (Standing)    Stand with support. Squeeze pelvic floor and hold. Lift right leg out to side, keeping toe forward.  Repeat _10__ times. Do _2-3__ times a day. Repeat with other leg.    Over Head Pull: Narrow and Wide Grip   Start with yellow band for all then progress to red once this becomes easier.   On back, knees bent, feet flat, band across thighs, elbows  straight but relaxed. Pull hands apart (start). Keeping elbows straight, bring arms up and over head, hands toward floor. Keep pull steady on band. Hold momentarily. Return slowly, keeping pull steady, back to start. Then do same with a wider grip on the band (past shoulder width) Repeat _5-10__ times. Band color __red____   Side Pull: Double Arm   On back, knees bent, feet flat. Arms perpendicular to body, shoulder level, elbows straight but relaxed. Pull arms out to sides, elbows straight. Resistance band comes across collarbones, hands toward floor. Hold momentarily. Slowly return to starting position. Repeat _5-10__ times. Band color _red____   Sword   On back, knees bent, feet flat, left hand on left hip, right hand above left. Pull right arm DIAGONALLY (hip to shoulder) across chest. Bring right arm along head toward floor. Hold momentarily. Slowly return to starting position. Repeat _5-10__ times. Do with left arm. Band color _red_____   Shoulder Rotation: Double Arm   On back, knees bent, feet flat, elbows tucked at sides, bent 90, hands palms up. Pull hands apart and down toward floor, keeping elbows near sides. Hold momentarily. Slowly return to starting position. Repeat _5-10__ times. Band color __red____

## 2023-07-01 ENCOUNTER — Other Ambulatory Visit: Payer: Self-pay | Admitting: Cardiology

## 2023-07-01 ENCOUNTER — Other Ambulatory Visit: Payer: Self-pay | Admitting: Hematology and Oncology

## 2023-07-01 DIAGNOSIS — I428 Other cardiomyopathies: Secondary | ICD-10-CM

## 2023-07-01 DIAGNOSIS — I5042 Chronic combined systolic (congestive) and diastolic (congestive) heart failure: Secondary | ICD-10-CM

## 2023-07-01 DIAGNOSIS — I10 Essential (primary) hypertension: Secondary | ICD-10-CM

## 2023-07-01 DIAGNOSIS — I429 Cardiomyopathy, unspecified: Secondary | ICD-10-CM

## 2023-07-02 ENCOUNTER — Ambulatory Visit: Payer: 59

## 2023-07-02 ENCOUNTER — Other Ambulatory Visit: Payer: Self-pay | Admitting: Cardiology

## 2023-07-02 DIAGNOSIS — Z483 Aftercare following surgery for neoplasm: Secondary | ICD-10-CM | POA: Diagnosis not present

## 2023-07-02 DIAGNOSIS — Z9189 Other specified personal risk factors, not elsewhere classified: Secondary | ICD-10-CM | POA: Diagnosis not present

## 2023-07-02 DIAGNOSIS — M25611 Stiffness of right shoulder, not elsewhere classified: Secondary | ICD-10-CM | POA: Diagnosis not present

## 2023-07-02 DIAGNOSIS — M25612 Stiffness of left shoulder, not elsewhere classified: Secondary | ICD-10-CM | POA: Diagnosis not present

## 2023-07-02 DIAGNOSIS — Z9013 Acquired absence of bilateral breasts and nipples: Secondary | ICD-10-CM | POA: Diagnosis not present

## 2023-07-02 NOTE — Therapy (Signed)
Patient Details  Name: Jordan Willis MRN: [id number] Date of Birth: [birth year] Referring Provider:  Santiago Glad, MD  Encounter Date: 07/02/2023   OUTPATIENT PHYSICAL THERAPY ONCOLOGY TREATMENT  Patient Name: Jordan Willis MRN: [id number] DOB:05/02/1951, 72 y.o., female Today's Date: 07/02/2023  END OF SESSION:  PT End of Session - 07/02/23 1111     Visit Number 13    Number of Visits 18    Date for PT Re-Evaluation 07/08/23    PT Start Time 1107    PT Stop Time 1202    PT Time Calculation (min) 55 min    Activity Tolerance Patient tolerated treatment well    Behavior During Therapy [facility] for tasks assessed/performed               Past Medical History:  Diagnosis Date   AICD (automatic cardioverter/defibrillator) present    Anemia    Anginal pain (HCC)    Anxiety    Arthritis    Lumbar spine DDD.   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   Diabetes mellitus without complication (HCC)    Type II   Dyspnea    When patient gets sick uses Pro-Air inhaler   Frequent sinus infections    GERD (gastroesophageal reflux disease)    occ   Goiter    History of cardiomegaly    History of cataract    Bilateral   History of kidney stones    08/28/2018 currently has a large kidney stone, asymptomatic at this time   History of migraine    History of uterine fibroid    History of uterine prolapse    Hyperlipidemia    Hypertension    LBBB (left bundle branch block)    Menopause 03/03/2012   Nasal turbinate hypertrophy 11/29/2016   Left inferior    Personal history of chemotherapy    Personal history of radiation therapy    Pneumonia    as a child   PONV (postoperative nausea and vomiting)    Sinusitis 2014   Sleep apnea    No CPAP   SUI (stress urinary incontinence, female)    SVD  (spontaneous vaginal delivery)    x 2   Thyroid nodule    Past Surgical History:  Procedure Laterality Date   ABDOMINAL HYSTERECTOMY     BILATERAL SALPINGOOPHORECTOMY Bilateral    BIV ICD INSERTION CRT-D N/A 08/24/2022   Procedure: BIV ICD INSERTION CRT-D;  Surgeon: Marinus Maw, MD;  Location: [facility] INVASIVE CV LAB;  Service: Cardiovascular;  Laterality: N/A;   BLADDER SUSPENSION N/A 05/16/2017   Procedure: TRANSVAGINAL TAPE (TVT) PROCEDURE;  Surgeon: Osborn Coho, MD;  Location: [facility] ORS;  Service: Gynecology;  Laterality: N/A;   BREAST BIOPSY Right 2009   BREAST IMPLANT REMOVAL Bilateral 02/05/2022   Procedure: Removal bilateral breast implants;  Surgeon: Allena Napoleon, MD;  Location: [facility] OR;  Service: Plastics;  Laterality: Bilateral;   BREAST LUMPECTOMY Right 2009   BREAST LUMPECTOMY Left 2006   BREAST RECONSTRUCTION WITH PLACEMENT OF TISSUE EXPANDER AND FLEX HD (ACELLULAR HYDRATED DERMIS) Bilateral 09/04/2021   Procedure: BILATERAL BREAST RECONSTRUCTION WITH PLACEMENT OF TISSUE EXPANDER AND FLEX HD (ACELLULAR HYDRATED DERMIS);  Surgeon: Allena Napoleon, MD;  Location: [facility] OR;  Service: Plastics;  Laterality: Bilateral;   CARDIAC CATHETERIZATION  CATARACT EXTRACTION Left 10/23/2017   CATARACT EXTRACTION Right 11/06/2017   COLONOSCOPY  10/22/2009   Normal.  Repeat 5 years.  [name]   COLONOSCOPY WITH PROPOFOL N/A 12/21/2022   Procedure: COLONOSCOPY WITH PROPOFOL;  Surgeon: Jeani Hawking, MD;  Location: [facility] ENDOSCOPY;  Service: Gastroenterology;  Laterality: N/A;   CYSTOSCOPY N/A 05/16/2017   Procedure: CYSTOSCOPY;  Surgeon: Osborn Coho, MD;  Location: [facility] ORS;  Service: Gynecology;  Laterality: N/A;   DEBRIDEMENT AND CLOSURE WOUND Bilateral 10/03/2021   Procedure: Debridement bilateral mastectomy flaps;  Surgeon: Allena Napoleon, MD;  Location: [facility] OR;  Service: Plastics;  Laterality: Bilateral;  1.5 hour   EYE SURGERY     bilateral cataracts   LEFT HEART CATH AND CORONARY  ANGIOGRAPHY N/A 02/09/2020   Procedure: LEFT HEART CATH AND CORONARY ANGIOGRAPHY;  Surgeon: Elder Negus, MD;  Location: [facility] INVASIVE CV LAB;  Service: Cardiovascular;  Laterality: N/A;   LEFT HEART CATH AND CORONARY ANGIOGRAPHY N/A 03/27/2022   Procedure: LEFT HEART CATH AND CORONARY ANGIOGRAPHY;  Surgeon: Yates Decamp, MD;  Location: [facility] INVASIVE CV LAB;  Service: Cardiovascular;  Laterality: N/A;   MASS EXCISION Right 12/04/2022   Procedure: EXCISION OF RIGHT CHEST WALL MASS;  Surgeon: Almond Lint, MD;  Location: [facility] OR;  Service: General;  Laterality: Right;   MASTECTOMY W/ SENTINEL NODE BIOPSY Right 09/04/2021   Procedure: BILATERAL MASTECTOMIES WITH RIGHT SENTINEL LYMPH NODE BIOPSY;  Surgeon: Almond Lint, MD;  Location: [facility] OR;  Service: General;  Laterality: Right;   REMOVAL OF BILATERAL TISSUE EXPANDERS WITH PLACEMENT OF BILATERAL BREAST IMPLANTS Bilateral 10/03/2021   Procedure: REMOVAL OF BILATERAL TISSUE EXPANDERS WITH REPLACEMENT OF TISSUE EXPANDERS;  Surgeon: Allena Napoleon, MD;  Location: [facility] OR;  Service: Plastics;  Laterality: Bilateral;   REMOVAL OF BILATERAL TISSUE EXPANDERS WITH PLACEMENT OF BILATERAL BREAST IMPLANTS Bilateral 12/19/2021   Procedure: REMOVAL OF BILATERAL TISSUE EXPANDERS WITH PLACEMENT OF BILATERAL BREAST IMPLANTS;  Surgeon: Allena Napoleon, MD;  Location: [facility] OR;  Service: Plastics;  Laterality: Bilateral;   ROBOTIC ASSISTED TOTAL HYSTERECTOMY N/A 05/16/2017   Procedure: ROBOTIC ASSISTED TOTAL HYSTERECTOMY;  Surgeon: Silverio Lay, MD;  Location: [facility] ORS;  Service: Gynecology;  Laterality: N/A;   ROTATOR CUFF REPAIR Bilateral    SCAR REVISION N/A 12/04/2022   Procedure: REVISION OF MASTECTOMY SCAR;  Surgeon: Almond Lint, MD;  Location: [facility] OR;  Service: General;  Laterality: N/A;   SHOULDER ARTHROSCOPY WITH ROTATOR CUFF REPAIR AND SUBACROMIAL DECOMPRESSION Right 09/03/2018   Procedure: Right shoulder manipulation under anesthesia, exam under anesthesia, mini  open rotator cuff repair, subacromial decompression;  Surgeon: Jene Every, MD;  Location: [facility] ORS;  Service: Orthopedics;  Laterality: Right;  90 mins   TUBAL LIGATION     WISDOM TOOTH EXTRACTION     Patient Active Problem List   Diagnosis Date Noted   ICD (implantable cardioverter-defibrillator) in place 12/25/2022   Bradycardia 08/24/2022   BRCA2 gene mutation positive in female 07/30/2022   LBBB (left bundle branch block)    Chronic diastolic heart failure (HCC)    NICM (nonischemic cardiomyopathy) (HCC)    Breast cancer of upper-outer quadrant of right female breast (HCC) 09/04/2021   Diabetic neuropathy (HCC) 02/08/2021   Angina pectoris (HCC) 02/09/2020   Abnormal stress test 02/09/2020   Complete rotator cuff tear 09/03/2018   Pelvic prolapse 05/16/2017   Intractable chronic post-traumatic headache 11/19/2016   Malignant neoplasm of upper-outer quadrant of left breast in female, estrogen receptor negative (HCC) 03/03/2012  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   Menopause 03/03/2012    PCP: Merri Brunette, MD  REFERRING PROVIDER: Santiago Glad, MD  REFERRING DIAG: breast cancer, stiffness in shoulders  Z98.890 (ICD-10-CM) - S/P scar revision THERAPY DIAG:  History of bilateral mastectomy  Aftercare following surgery for neoplasm  At risk for lymphedema  Stiffness of left shoulder, not elsewhere classified  Stiffness of right shoulder, not elsewhere classified  ONSET DATE:  February 2024   Rationale for Evaluation and Treatment: Rehabilitation  SUBJECTIVE:                                                                                                                                                                                           SUBJECTIVE STATEMENT I've been feeling a lot better. I have to wear the heart monitor until Friday.   PERTINENT HISTORY: Pt was diagnosed with right breast cancer dx 05/2021. She has BRCA2 genetic  mutation and has had left breast cancer in 2006 with chemo/breast conservation and XRT. She then had right breast cancer (dcis) with lumpectomy and radiation only. Dr. Lurene Shadow was her surgeon. She presented this time with a palpable painful mass in the upper outer right breast. Dx imaging showed a 1.2 cm mass at 10 o'clock. 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 The cardiomyopathy recovered, but the neuropathy did not. Her sister and mother have both had breast cancer. Pt underwent bilateral mastectomies with right sentinel node biopsy and expander reconstruction (Pace) 09/04/21. Final path at that time was [id number] with 6 negative nodes and negative margins. Patient ended up having the expanders removed because of continued drainage and discomfort.  2.14.2024 : revision of left mastectomy scar and removal of mass on right lateral mastectomy scar. She was found to have a nodule there and diagnostic imaging was performed. Ultrasound showed a 1.3 cm irregular mass at 9:00. Core needle biopsy was performed and this demonstrated invasive ductal carcinoma, grade 3. This was weakly ER positive, PR negative, HER2 positive, Ki-67 70%. She is currently undergoing chemotherapy   Pt also has a pacemaker  and defibrillator.  She has a decrease ejection fraction of 30% likely exacerbated by the chemotherapy.    Review of Systems:  PAIN:   PAIN:  Are you having pain? No    PRECAUTIONS: Fall and Other: at risk for lymphedema, cardiac precautions   WEIGHT BEARING RESTRICTIONS: No  FALLS:  Has patient fallen in last 6 months? No  LIVING ENVIRONMENT: Lives with: lives with their family OCCUPATION: does not  work   LEISURE: wants to go to [employer] she can only walk one block before she fatigues   HAND DOMINANCE: right   PRIOR LEVEL OF FUNCTION: Independent though limited by fatigue   PATIENT GOALS: to  get things back on the top shelf of her cabinets    OBJECTIVE:  COGNITION: Overall cognitive status: Within functional limits for tasks assessed   PALPATION: Tight limited skin excursion across both sides of her chest with limited scar mobility   OBSERVATIONS / OTHER ASSESSMENTS: pt with visible tightness at chest with contraction of scars and fullness above scars    POSTURE: forward head rounded shoulders   UPPER EXTREMITY AROM/PROM:  A/PROM RIGHT   eval  RIGHT 05/09/2023 RIGHT 06/10/2023 RIGHT 06/28/2023  Shoulder extension 20 46    Shoulder flexion 110 138 136 147  Shoulder abduction 90 130 135 154  Shoulder internal rotation 20 with pain  40    Shoulder external rotation 90 97      (Blank rows = not tested)  A/PROM LEFT   eval LEFT 05/09/2023 LEFT 06/10/2023 LEFT 06/28/2023  Shoulder extension 20 45    Shoulder flexion 120 135 136 146  Shoulder abduction 95 130 136 150  Shoulder internal rotation 5 with pain  40    Shoulder external rotation 90 102      (Blank rows = not tested)  UPPER EXTREMITY STRENGTH: less that 3/5 as pt is not able to lift arms to end range of her available range of motion, diffuse muscle atrophy visible  LYMPHEDEMA ASSESSMENTS:  SURGERY TYPE/DATE: multiple surgeries 2006, 2022, 2024 NUMBER OF LYMPH NODES REMOVED: 6 CHEMOTHERAPY: yes, ongoing  RADIATION:yes HORMONE TREATMENT: yes  INFECTIONS: no  LYMPHEDEMA ASSESSMENTS:   LANDMARK RIGHT  eval  10 cm proximal to olecranon process 35  Olecranon process 28  10 cm proximal to ulnar styloid process 22.7  Just proximal to ulnar styloid process 17.5  Across hand at thumb web space 20  At base of 2nd digit 6.5  (Blank rows = not tested)  LANDMARK LEFT  eval  10 cm proximal to olecranon process 35  Olecranon process 27.5  10 cm proximal to ulnar styloid process 21.5  Just proximal to ulnar styloid process 17.5  Across hand at thumb web space 20  At base of 2nd digit 6.5  (Blank rows =  not tested)  FUNCTIONAL TESTS:  6 reps in  30 seconds chair stand test  " pt very slow   QUICK DASH SURVEY: 45.45  TODAY'S TREATMENT:                                                                                                                                         DATE:  07/02/23: Therapeutic Exercises Nu step Lev 4 seat 9, UE 9 x 6 min, 513 steps Pulleys into flex and abd x each with VC's to  remind pt to decrease Rt scapular compensation; pt also required VC's to relax as her UE's were getting fatigued after flex, she reports abd felt better with trying to relax Standing with back against wall for bil UE 3 way raises x 10 each except x 8 reps for abd due to muscle fatigue; returning therapist demo for each Supine over half foam roll for following: Bil horz abd x 10, bil UE scaption into a "V" x 10 and then bil UE abd into a "snow angel" x 5, 5 sec holds Manual Therapy STM to bilateral UT and neck with cocoa butter PROM in supine to bilateral shoulders in to flexion, scaption and D2 flexion Gentle STM/MFR to bilateral chest over tight musculature avoiding pacemaker on R chest and heart monitor on left chest  06/28/2023 Nu step Lev 4 seat 9, UE 8 x 5 min, 365 steps Pulleys x 2 min flexion and abd Supine scapular series yellow x 10 ea flexion, ER, Horizontal abd, Measured AROM STM to bilateral UT and neck with cocoa butter PROM in supine to bilateral shoulders in to flexion, scaption and D2 flexion Gentle STM/MFR to bilateral chest over tight musculature avoiding pacemaker on R chest and heart monitor on left chest   06/19/23 Shortened tx session due to pt arriving late PROM in supine to bilateral shoulders in to flexion, scaption and D2  Gentle STM/MFR to bilateral chest over tight musculature avoiding pacemaker on R chest  06/10/2023 Held Nustep and standing exs as pt still not feeling very well. Left chest intermittent sharp pain today  and with bad HA  STM to bil lateral  chest wall/lateral trunk/UT over palpably tight musculature being mindful of pacemaker on Rt chest,  P/ROM in supine to bil shoulders into flex, scaption and D2 , and ER with scapular depression throughout Measured AROM and checked goals for recert  06/05/2023 Held exs due to recovering from fever and not feeling great today STM to bil lateral chest wall/trunk/UTover palpably tight musculature being mindful of pacemaker on Rt chest, and to right UT/scapular area in SL P/ROM in supine to bil shoulders into flex, scaption and D2 , and ER with scapular depression throughout Supine bilateral AROM flexion, scaption, horizontal abd x 5 Treatment decreased due to 2 bathroom breaks 05/29/23: Therapeutic Exercises NuStep level 5 x 1:45, level 4 for remaining time up to 6 mins, 430 steps; pt reports being challenged by this  Standing in // bars for bil hip 3 way raises x 10 each, pt returned therapist demo for each and VC's during for corrected LE position and to decrease trunk and pelvis compensations Supine Scapular series with red theraband x 10 each returning therapist demo for each; pt reports her arms felt tired after these so issued yellow for her to use initially at home  Manual Therapy STM to bil lateral chest wall/trunk/UT over palpably tight musculature being mindful of pacemaker on Rt chest, and to right UT/scapular area in SL P/ROM in supine to bil shoulders into flex, scaption and D2 with scapular depression throughout  05/09/2023 STM to bil lateral chest wall/trunk/UTover palpably tight musculature being mindful of pacemaker on Rt chest, and to right UT/scapular area in SL P/ROM in supine to bil shoulders into flex, scaption and D2 , and ER with scapular depression throughout Supine bilateral AROM flexion, scaption, ABD Measured AROM Jobes flexion x 5   05/06/2023 Pulleys x 2 min flex and abd Ball rolls on wall x 10 Supine bilateral UE AROM flexion,  scaption, horizntal abd x 5 STM to  bil lateral chest wall/trunk/UTover palpably tight musculature being mindful of pacemaker on Rt chest, and to right UT/scapular area in SL MFR to left chest region and right  chest avoiding pacemaker Supine wand flexion and scaption x 5 P/ROM in supine to bil shoulders into flex, scaption and D2 , and ER with scapular depression throughout  05/01/23 STM to bil lateral chest wall/trunk/UTover palpably tight musculature being mindful of pacemaker on Rt chest, and to right UT/scapular area in SL Supine wand flexion and scaption x 5 P/ROM in supine to bil shoulders into flex, scaption and D2 , and ER with scapular depression throughout  04/29/23: Therapeutic Exercises Pulleys into flex and scaption x 2 mins each Roll yellow ball up wall into flexion x 10 and bil abd x 5 each returning therapist demo Supine over half foam for following: Bil UE horz abd, bil UE scaption into a "V" x 10 each; and then bil abd "snow angel" x 5, 5 sec holds  Manual Therapy P/ROM in supine to bil shoulders into flex, scaption and D2 with scapular depression throughout STM to bil lateral chest wall/trunk over palpably tight musculature being mindful of pacemaker on Rt chest  04/18/23: Manual Therapy P/ROM in supine to bil shoulders into flex, scaption and D2 with scapular depression throughout STM to bil lateral chest wall/trunk over palpably tight musculature Therapeutic Exercises Pulleys into flex and scaption x 2 mins each returning therapist demo and VC's during to decr scapular compensation Lt UE NTS on wall as pt reports this UE tingles when she tries to lay on it at night; encouraged to do this only about 5 reps at a time and to not overstretch the nerve.  Roll yellow ball on wall for flexion x 10 returning therapist demo and VC's with demo to decr scap compensation throughout. Pt able to do this with cuing   04/15/23: In supine PROM to bilateral shoulders in to flexion, abduction and ER with increased tightness  bilaterally and discomfort. Pt guarded throughout. At end of session pt had a spasm in her R pec that eased with STM and stretching. Instructed pt in seated pec stretch and encouraged her to try doorway or corner stretch at home as long as no sharp pain  04/09/2023 evaluation   PATIENT EDUCATION:  Education details: Standing bil hip 3 way raises and supine scapular series Person educated: Patient Education method: Explanation, demonstration, verbal cues, handout issued Education comprehension: verbalized understanding, returned demo, VC's, will benefit from further review  HOME EXERCISE PROGRAM: Pt encouraged to do the broomstick exercises and shoulder exercises she has previously been doing  Doorway or corner stretch Standing bil LE hip 3 way raises and supine scapular series  ASSESSMENT:  CLINICAL IMPRESSION: Pt continues to feel well so able to cont with exercises today. Also continued with manual therapy working to improve her end bil shoulder P/ROM but being mindful of heart monitor. She has to wear this until Friday.   OBJECTIVE IMPAIRMENTS: cardiopulmonary status limiting activity, decreased activity tolerance, decreased endurance, decreased ROM, decreased strength, increased fascial restrictions, impaired perceived functional ability, impaired UE functional use, and postural dysfunction.   ACTIVITY LIMITATIONS: carrying, lifting, standing, reach over head, and locomotion level  PARTICIPATION LIMITATIONS: meal prep, cleaning, laundry, and yard work  PERSONAL FACTORS: Age, Past/current experiences, and 3+ comorbidities: previous surgery, decreased cardiac status, onling chemotherapy   are also affecting patient's functional outcome.   REHAB POTENTIAL: Good  CLINICAL DECISION  MAKING: Stable/uncomplicated  EVALUATION COMPLEXITY: Low  GOALS: Goals reviewed with patient? Yes  SHORT TERM GOALS: Target date: 05/09/2023  Pt will be independent in a basic exercise program for  shoulder ROM and Strenth  Baseline:needs review Goal status:MET 06/10/2023 LONG TERM GOALS: Target date: 8/18.2024  Pt will decrease quick DASH score to < 20 indicating an improvement in functional use of her UE's Baseline: 45.45 Goal status: ONGOING  2.  Pt will have increased shoulder flextion of >  135 degrees in both shoulders so that she can use her arms better at home  Baseline: 110 on right and 120 on left  Goal status: Right MET 136, Left MET 06/27/2022  3.  Pt will report that she can put things on the top shelf of her cabinets at home without using a stepstool  Baseline: unable  Goal status: MET 06/28/2023  4.  Pt will increase to 10 reps of sit to stand in 30 seconds indicating an improvement in activity tolerance and functional strength  Baseline: 6 Goal status: Ongoing, did not test today due to not feeling well 06/10/23  PLAN:  PT FREQUENCY: 1-2x/week  PT DURATION:4 weeks PLANNED INTERVENTIONS: Therapeutic exercises, Therapeutic activity, Neuromuscular re-education, Balance training, Gait training, Patient/Family education, Self Care, Joint mobilization, scar mobilization, and Manual therapy  PLAN FOR NEXT SESSION: Cont manual work for soft tissue and scar mobilization, passive and A/AA/ROM excise for shoulders; add strength, Cont nustep and functional endurance exercise   Hermenia Bers, PTA 07/02/2023, 12:11 PM   [facility] [address] [place], [place], [address] Phone: [phone]   Fax:  [phone]         Cancer Rehab [phone] HIP: Flexion Standing    Holding onto counter lift leg straight in front keeping knee straight. Slow and controlled! _10__ reps per set, _2-3__ sets per day. Then repeat with other leg.   Hip Extension (Standing)    Stand with support at counter. Squeeze pelvic floor and hold so as not to twist hips and don't lean forward.  Move right leg backward with straight knee. Slow and  controlled! Repeat _10__ times. Do _2-3__ times a day. Repeat with other leg.  Hip Abduction (Standing)    Stand with support. Squeeze pelvic floor and hold. Lift right leg out to side, keeping toe forward.  Repeat _10__ times. Do _2-3__ times a day. Repeat with other leg.    Over Head Pull: Narrow and Wide Grip   Start with yellow band for all then progress to red once this becomes easier.   On back, knees bent, feet flat, band across thighs, elbows straight but relaxed. Pull hands apart (start). Keeping elbows straight, bring arms up and over head, hands toward floor. Keep pull steady on band. Hold momentarily. Return slowly, keeping pull steady, back to start. Then do same with a wider grip on the band (past shoulder width) Repeat _5-10__ times. Band color __red____   Side Pull: Double Arm   On back, knees bent, feet flat. Arms perpendicular to body, shoulder level, elbows straight but relaxed. Pull arms out to sides, elbows straight. Resistance band comes across collarbones, hands toward floor. Hold momentarily. Slowly return to starting position. Repeat _5-10__ times. Band color _red____   Sword   On back, knees bent, feet flat, left hand on left hip, right hand above left. Pull right arm DIAGONALLY (hip to shoulder) across chest. Bring right arm along head toward floor. Hold momentarily. Slowly return to starting  position. Repeat _5-10__ times. Do with left arm. Band color _red_____   Shoulder Rotation: Double Arm   On back, knees bent, feet flat, elbows tucked at sides, bent 90, hands palms up. Pull hands apart and down toward floor, keeping elbows near sides. Hold momentarily. Slowly return to starting position. Repeat _5-10__ times. Band color __red____

## 2023-07-04 ENCOUNTER — Inpatient Hospital Stay: Payer: 59 | Attending: Hematology and Oncology

## 2023-07-04 ENCOUNTER — Inpatient Hospital Stay: Payer: 59

## 2023-07-04 VITALS — BP 124/82 | HR 78 | Temp 97.9°F | Wt 184.5 lb

## 2023-07-04 DIAGNOSIS — Z923 Personal history of irradiation: Secondary | ICD-10-CM | POA: Insufficient documentation

## 2023-07-04 DIAGNOSIS — Z9013 Acquired absence of bilateral breasts and nipples: Secondary | ICD-10-CM | POA: Diagnosis not present

## 2023-07-04 DIAGNOSIS — Z79811 Long term (current) use of aromatase inhibitors: Secondary | ICD-10-CM | POA: Insufficient documentation

## 2023-07-04 DIAGNOSIS — Z171 Estrogen receptor negative status [ER-]: Secondary | ICD-10-CM | POA: Insufficient documentation

## 2023-07-04 DIAGNOSIS — C50412 Malignant neoplasm of upper-outer quadrant of left female breast: Secondary | ICD-10-CM | POA: Insufficient documentation

## 2023-07-04 DIAGNOSIS — Z803 Family history of malignant neoplasm of breast: Secondary | ICD-10-CM | POA: Diagnosis not present

## 2023-07-04 DIAGNOSIS — Z1509 Genetic susceptibility to other malignant neoplasm: Secondary | ICD-10-CM | POA: Diagnosis not present

## 2023-07-04 MED ORDER — DIPHENHYDRAMINE HCL 25 MG PO CAPS
25.0000 mg | ORAL_CAPSULE | Freq: Once | ORAL | Status: AC
  Administered 2023-07-04: 25 mg via ORAL
  Filled 2023-07-04: qty 1

## 2023-07-04 MED ORDER — ACETAMINOPHEN 325 MG PO TABS
650.0000 mg | ORAL_TABLET | Freq: Once | ORAL | Status: AC
  Administered 2023-07-04: 650 mg via ORAL
  Filled 2023-07-04: qty 2

## 2023-07-04 MED ORDER — TRASTUZUMAB-HYALURONIDASE-OYSK 600-10000 MG-UNT/5ML ~~LOC~~ SOLN
600.0000 mg | Freq: Once | SUBCUTANEOUS | Status: AC
  Administered 2023-07-04: 600 mg via SUBCUTANEOUS
  Filled 2023-07-04: qty 5

## 2023-07-04 NOTE — Patient Instructions (Signed)
[facility]  Discharge Instructions: Thank you for choosing [facility] to provide your oncology and hematology care.   If you have a lab appointment with the Cancer Center, please go directly to the Cancer Center and check in at the registration area.   Wear comfortable clothing and clothing appropriate for easy access to any Portacath or PICC line.   We strive to give you quality time with your provider. You may need to reschedule your appointment if you arrive late (15 or more minutes).  Arriving late affects you and other patients whose appointments are after yours.  Also, if you miss three or more appointments without notifying the office, you may be dismissed from the clinic at the provider's discretion.      For prescription refill requests, have your pharmacy contact our office and allow 72 hours for refills to be completed.    Today you received the following chemotherapy and/or immunotherapy agents herceptin hylecta      To help prevent nausea and vomiting after your treatment, we encourage you to take your nausea medication as directed.  BELOW ARE SYMPTOMS THAT SHOULD BE REPORTED IMMEDIATELY: *FEVER GREATER THAN 100.4 F (38 C) OR HIGHER *CHILLS OR SWEATING *NAUSEA AND VOMITING THAT IS NOT CONTROLLED WITH YOUR NAUSEA MEDICATION *UNUSUAL SHORTNESS OF BREATH *UNUSUAL BRUISING OR BLEEDING *URINARY PROBLEMS (pain or burning when urinating, or frequent urination) *BOWEL PROBLEMS (unusual diarrhea, constipation, pain near the anus) TENDERNESS IN MOUTH AND THROAT WITH OR WITHOUT PRESENCE OF ULCERS (sore throat, sores in mouth, or a toothache) UNUSUAL RASH, SWELLING OR PAIN  UNUSUAL VAGINAL DISCHARGE OR ITCHING   Items with * indicate a potential emergency and should be followed up as soon as possible or go to the Emergency Department if any problems should occur.  Please show the CHEMOTHERAPY ALERT CARD or IMMUNOTHERAPY ALERT CARD  at check-in to the Emergency Department and triage nurse.  Should you have questions after your visit or need to cancel or reschedule your appointment, please contact [facility]  Dept: [phone]  and follow the prompts.  Office hours are 8:00 a.m. to 4:30 p.m. Monday - Friday. Please note that voicemails left after 4:00 p.m. may not be returned until the following business day.  We are closed weekends and major holidays. You have access to a nurse at all times for urgent questions. Please call the main number to the clinic Dept: [phone] and follow the prompts.   For any non-urgent questions, you may also contact your provider using MyChart. We now offer e-Visits for anyone 18 and older to request care online for non-urgent symptoms. For details visit [email or web address].   Also download the MyChart app! Go to the app store, search "MyChart", open the app, select [facility], and log in with your MyChart username and password.   

## 2023-07-05 ENCOUNTER — Ambulatory Visit: Payer: 59

## 2023-07-05 DIAGNOSIS — Z9013 Acquired absence of bilateral breasts and nipples: Secondary | ICD-10-CM

## 2023-07-05 DIAGNOSIS — M25611 Stiffness of right shoulder, not elsewhere classified: Secondary | ICD-10-CM | POA: Diagnosis not present

## 2023-07-05 DIAGNOSIS — Z483 Aftercare following surgery for neoplasm: Secondary | ICD-10-CM | POA: Diagnosis not present

## 2023-07-05 DIAGNOSIS — Z9189 Other specified personal risk factors, not elsewhere classified: Secondary | ICD-10-CM

## 2023-07-05 DIAGNOSIS — M25612 Stiffness of left shoulder, not elsewhere classified: Secondary | ICD-10-CM | POA: Diagnosis not present

## 2023-07-05 NOTE — Therapy (Signed)
Patient Details  Name: Jordan Willis MRN: [id number] Date of Birth: 03-18-1951 Referring Provider:  Santiago Glad, MD  Encounter Date: 07/05/2023   OUTPATIENT PHYSICAL THERAPY ONCOLOGY TREATMENT  Patient Name: Jordan Willis MRN: [id number] DOB:05/23/1951, 72 y.o., female Today's Date: 07/05/2023  END OF SESSION:  PT End of Session - 07/05/23 0913     Visit Number 14    Number of Visits 18    Date for PT Re-Evaluation 07/08/23    PT Start Time 0911   late   PT Stop Time 0955    PT Time Calculation (min) 44 min    Activity Tolerance Patient tolerated treatment well    Behavior During Therapy [facility] for tasks assessed/performed                Past Medical History:  Diagnosis Date   AICD (automatic cardioverter/defibrillator) present    Anemia    Anginal pain (HCC)    Anxiety    Arthritis    Lumbar spine DDD.   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   Diabetes mellitus without complication (HCC)    Type II   Dyspnea    When patient gets sick uses Pro-Air inhaler   Frequent sinus infections    GERD (gastroesophageal reflux disease)    occ   Goiter    History of cardiomegaly    History of cataract    Bilateral   History of kidney stones    08/28/2018 currently has a large kidney stone, asymptomatic at this time   History of migraine    History of uterine fibroid    History of uterine prolapse    Hyperlipidemia    Hypertension    LBBB (left bundle branch block)    Menopause 03/03/2012   Nasal turbinate hypertrophy 11/29/2016   Left inferior    Personal history of chemotherapy    Personal history of radiation therapy    Pneumonia    as a child   PONV (postoperative nausea and vomiting)    Sinusitis 2014   Sleep apnea    No CPAP   SUI (stress urinary incontinence, female)     SVD (spontaneous vaginal delivery)    x 2   Thyroid nodule    Past Surgical History:  Procedure Laterality Date   ABDOMINAL HYSTERECTOMY     BILATERAL SALPINGOOPHORECTOMY Bilateral    BIV ICD INSERTION CRT-D N/A 08/24/2022   Procedure: BIV ICD INSERTION CRT-D;  Surgeon: Marinus Maw, MD;  Location: [facility] INVASIVE CV LAB;  Service: Cardiovascular;  Laterality: N/A;   BLADDER SUSPENSION N/A 05/16/2017   Procedure: TRANSVAGINAL TAPE (TVT) PROCEDURE;  Surgeon: Osborn Coho, MD;  Location: [facility] ORS;  Service: Gynecology;  Laterality: N/A;   BREAST BIOPSY Right 2009   BREAST IMPLANT REMOVAL Bilateral 02/05/2022   Procedure: Removal bilateral breast implants;  Surgeon: Allena Napoleon, MD;  Location: [facility] OR;  Service: Plastics;  Laterality: Bilateral;   BREAST LUMPECTOMY Right 2009   BREAST LUMPECTOMY Left 2006   BREAST RECONSTRUCTION WITH PLACEMENT OF TISSUE EXPANDER AND FLEX HD (ACELLULAR HYDRATED DERMIS) Bilateral 09/04/2021   Procedure: BILATERAL BREAST RECONSTRUCTION WITH PLACEMENT OF TISSUE EXPANDER AND FLEX HD (ACELLULAR HYDRATED DERMIS);  Surgeon: Allena Napoleon, MD;  Location: [facility] OR;  Service: Plastics;  Laterality: Bilateral;  CARDIAC CATHETERIZATION     CATARACT EXTRACTION Left 10/23/2017   CATARACT EXTRACTION Right 11/06/2017   COLONOSCOPY  10/22/2009   Normal.  Repeat 5 years.  [name]   COLONOSCOPY WITH PROPOFOL N/A 12/21/2022   Procedure: COLONOSCOPY WITH PROPOFOL;  Surgeon: Jeani Hawking, MD;  Location: [facility] ENDOSCOPY;  Service: Gastroenterology;  Laterality: N/A;   CYSTOSCOPY N/A 05/16/2017   Procedure: CYSTOSCOPY;  Surgeon: Osborn Coho, MD;  Location: [facility] ORS;  Service: Gynecology;  Laterality: N/A;   DEBRIDEMENT AND CLOSURE WOUND Bilateral 10/03/2021   Procedure: Debridement bilateral mastectomy flaps;  Surgeon: Allena Napoleon, MD;  Location: [facility] OR;  Service: Plastics;  Laterality: Bilateral;  1.5 hour   EYE SURGERY     bilateral cataracts   LEFT HEART CATH AND  CORONARY ANGIOGRAPHY N/A 02/09/2020   Procedure: LEFT HEART CATH AND CORONARY ANGIOGRAPHY;  Surgeon: Elder Negus, MD;  Location: [facility] INVASIVE CV LAB;  Service: Cardiovascular;  Laterality: N/A;   LEFT HEART CATH AND CORONARY ANGIOGRAPHY N/A 03/27/2022   Procedure: LEFT HEART CATH AND CORONARY ANGIOGRAPHY;  Surgeon: Yates Decamp, MD;  Location: [facility] INVASIVE CV LAB;  Service: Cardiovascular;  Laterality: N/A;   MASS EXCISION Right 12/04/2022   Procedure: EXCISION OF RIGHT CHEST WALL MASS;  Surgeon: Almond Lint, MD;  Location: [facility] OR;  Service: General;  Laterality: Right;   MASTECTOMY W/ SENTINEL NODE BIOPSY Right 09/04/2021   Procedure: BILATERAL MASTECTOMIES WITH RIGHT SENTINEL LYMPH NODE BIOPSY;  Surgeon: Almond Lint, MD;  Location: [facility] OR;  Service: General;  Laterality: Right;   REMOVAL OF BILATERAL TISSUE EXPANDERS WITH PLACEMENT OF BILATERAL BREAST IMPLANTS Bilateral 10/03/2021   Procedure: REMOVAL OF BILATERAL TISSUE EXPANDERS WITH REPLACEMENT OF TISSUE EXPANDERS;  Surgeon: Allena Napoleon, MD;  Location: [facility] OR;  Service: Plastics;  Laterality: Bilateral;   REMOVAL OF BILATERAL TISSUE EXPANDERS WITH PLACEMENT OF BILATERAL BREAST IMPLANTS Bilateral 12/19/2021   Procedure: REMOVAL OF BILATERAL TISSUE EXPANDERS WITH PLACEMENT OF BILATERAL BREAST IMPLANTS;  Surgeon: Allena Napoleon, MD;  Location: [facility] OR;  Service: Plastics;  Laterality: Bilateral;   ROBOTIC ASSISTED TOTAL HYSTERECTOMY N/A 05/16/2017   Procedure: ROBOTIC ASSISTED TOTAL HYSTERECTOMY;  Surgeon: Silverio Lay, MD;  Location: [facility] ORS;  Service: Gynecology;  Laterality: N/A;   ROTATOR CUFF REPAIR Bilateral    SCAR REVISION N/A 12/04/2022   Procedure: REVISION OF MASTECTOMY SCAR;  Surgeon: Almond Lint, MD;  Location: [facility] OR;  Service: General;  Laterality: N/A;   SHOULDER ARTHROSCOPY WITH ROTATOR CUFF REPAIR AND SUBACROMIAL DECOMPRESSION Right 09/03/2018   Procedure: Right shoulder manipulation under anesthesia, exam under  anesthesia, mini open rotator cuff repair, subacromial decompression;  Surgeon: Jene Every, MD;  Location: [facility] ORS;  Service: Orthopedics;  Laterality: Right;  90 mins   TUBAL LIGATION     WISDOM TOOTH EXTRACTION     Patient Active Problem List   Diagnosis Date Noted   ICD (implantable cardioverter-defibrillator) in place 12/25/2022   Bradycardia 08/24/2022   BRCA2 gene mutation positive in female 07/30/2022   LBBB (left bundle branch block)    Chronic diastolic heart failure (HCC)    NICM (nonischemic cardiomyopathy) (HCC)    Breast cancer of upper-outer quadrant of right female breast (HCC) 09/04/2021   Diabetic neuropathy (HCC) 02/08/2021   Angina pectoris (HCC) 02/09/2020   Abnormal stress test 02/09/2020   Complete rotator cuff tear 09/03/2018   Pelvic prolapse 05/16/2017   Intractable chronic post-traumatic headache 11/19/2016   Malignant neoplasm of upper-outer quadrant of left breast in female,  estrogen receptor negative (HCC) 03/03/2012   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   Menopause 03/03/2012    PCP: Merri Brunette, MD  REFERRING PROVIDER: Santiago Glad, MD  REFERRING DIAG: breast cancer, stiffness in shoulders  Z98.890 (ICD-10-CM) - S/P scar revision THERAPY DIAG:  History of bilateral mastectomy  Aftercare following surgery for neoplasm  At risk for lymphedema  Stiffness of left shoulder, not elsewhere classified  Stiffness of right shoulder, not elsewhere classified  ONSET DATE:  February 2024   Rationale for Evaluation and Treatment: Rehabilitation  SUBJECTIVE:                                                                                                                                                                                           SUBJECTIVE STATEMENT I've been feeling a lot better. I took the heart monitor off this am. I did OK after last visit. I can reach higher now without pulling . Pulled weeds for 10 min  yesterday and felt good.  PERTINENT HISTORY: Pt was diagnosed with right breast cancer dx 05/2021. She has BRCA2 genetic mutation and has had left breast cancer in 2006 with chemo/breast conservation and XRT. She then had right breast cancer (dcis) with lumpectomy and radiation only. Dr. Lurene Shadow was her surgeon. She presented this time with a palpable painful mass in the upper outer right breast. Dx imaging showed a 1.2 cm mass at 10 o'clock. 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 The cardiomyopathy recovered, but the neuropathy did not. Her sister and mother have both had breast cancer. Pt underwent bilateral mastectomies with right sentinel node biopsy and expander reconstruction (Pace) 09/04/21. Final path at that time was [id number] with 6 negative nodes and negative margins. Patient ended up having the expanders removed because of continued drainage and discomfort.  2.14.2024 : revision of left mastectomy scar and removal of mass on right lateral mastectomy scar. She was found to have a nodule there and diagnostic imaging was performed. Ultrasound showed a 1.3 cm irregular mass at 9:00. Core needle biopsy was performed and this demonstrated invasive ductal carcinoma, grade 3. This was weakly ER positive, PR negative, HER2 positive, Ki-67 70%. She is currently undergoing chemotherapy   Pt also has a pacemaker  and defibrillator.  She has a decrease ejection fraction of 30% likely exacerbated by the chemotherapy.    Review of Systems:  PAIN:   PAIN:  Are you having pain? No    PRECAUTIONS: Fall and Other: at risk for lymphedema, cardiac precautions  WEIGHT BEARING RESTRICTIONS: No  FALLS:  Has patient fallen in last 6 months? No  LIVING ENVIRONMENT: Lives with: lives with their family OCCUPATION: does not work   LEISURE: wants to go to [employer] she can only walk one block before she  fatigues   HAND DOMINANCE: right   PRIOR LEVEL OF FUNCTION: Independent though limited by fatigue   PATIENT GOALS: to get things back on the top shelf of her cabinets    OBJECTIVE:  COGNITION: Overall cognitive status: Within functional limits for tasks assessed   PALPATION: Tight limited skin excursion across both sides of her chest with limited scar mobility   OBSERVATIONS / OTHER ASSESSMENTS: pt with visible tightness at chest with contraction of scars and fullness above scars    POSTURE: forward head rounded shoulders   UPPER EXTREMITY AROM/PROM:  A/PROM RIGHT   eval  RIGHT 05/09/2023 RIGHT 06/10/2023 RIGHT 06/28/2023  Shoulder extension 20 46    Shoulder flexion 110 138 136 147  Shoulder abduction 90 130 135 154  Shoulder internal rotation 20 with pain  40    Shoulder external rotation 90 97      (Blank rows = not tested)  A/PROM LEFT   eval LEFT 05/09/2023 LEFT 06/10/2023 LEFT 06/28/2023  Shoulder extension 20 45    Shoulder flexion 120 135 136 146  Shoulder abduction 95 130 136 150  Shoulder internal rotation 5 with pain  40    Shoulder external rotation 90 102      (Blank rows = not tested)  UPPER EXTREMITY STRENGTH: less that 3/5 as pt is not able to lift arms to end range of her available range of motion, diffuse muscle atrophy visible  LYMPHEDEMA ASSESSMENTS:  SURGERY TYPE/DATE: multiple surgeries 2006, 2022, 2024 NUMBER OF LYMPH NODES REMOVED: 6 CHEMOTHERAPY: yes, ongoing  RADIATION:yes HORMONE TREATMENT: yes  INFECTIONS: no  LYMPHEDEMA ASSESSMENTS:   LANDMARK RIGHT  eval  10 cm proximal to olecranon process 35  Olecranon process 28  10 cm proximal to ulnar styloid process 22.7  Just proximal to ulnar styloid process 17.5  Across hand at thumb web space 20  At base of 2nd digit 6.5  (Blank rows = not tested)  LANDMARK LEFT  eval  10 cm proximal to olecranon process 35  Olecranon process 27.5  10 cm proximal to ulnar styloid process 21.5   Just proximal to ulnar styloid process 17.5  Across hand at thumb web space 20  At base of 2nd digit 6.5  (Blank rows = not tested)  FUNCTIONAL TESTS:  6 reps in  30 seconds chair stand test  " pt very slow   QUICK DASH SURVEY: 45.45  TODAY'S TREATMENT:                                                                                                                                         DATE:  07/05/2023 Nu step Lev 4 seat 9, UE 9 x 6 min, 499 steps Pulleys into flex and abd x each with VC's to remind pt to decrease Rt scapular compensation;  Supine over half foam roll for following: Bil. Flexion x 5, Bil horz abd x 5, bil UE scaption into a "V" x 5 and then bil UE abd into a "snow angel" x 5, 5 sec holds Manual Therapy STM to bilateral UT and neck with cocoa butter PROM in supine to bilateral shoulders in to flexion, scaption and D2 flexion Gentle STM/MFR to bilateral chest over tight musculature avoiding pacemaker on R chest and heart monitor on left chest  07/02/23: Therapeutic Exercises Nu step Lev 4 seat 9, UE 9 x 6 min, 513 steps Pulleys into flex and abd x each with VC's to remind pt to decrease Rt scapular compensation; pt also required VC's to relax as her UE's were getting fatigued after flex, she reports abd felt better with trying to relax Standing with back against wall for bil UE 3 way raises x 10 each except x 8 reps for abd due to muscle fatigue; returning therapist demo for each Supine over half foam roll for following: Bil horz abd x 10, bil UE scaption into a "V" x 10 and then bil UE abd into a "snow angel" x 5, 5 sec holds Manual Therapy STM to bilateral UT and neck with cocoa butter PROM in supine to bilateral shoulders in to flexion, scaption and D2 flexion Gentle STM/MFR to bilateral chest over tight musculature avoiding pacemaker on R chest and heart monitor on left chest  06/28/2023 Nu step Lev 4 seat 9, UE 8 x 5 min, 365 steps Pulleys x 2 min  flexion and abd Supine scapular series yellow x 10 ea flexion, ER, Horizontal abd, Measured AROM STM to bilateral UT and neck with cocoa butter PROM in supine to bilateral shoulders in to flexion, scaption and D2 flexion Gentle STM/MFR to bilateral chest over tight musculature avoiding pacemaker on R chest and heart monitor on left chest   06/19/23 Shortened tx session due to pt arriving late PROM in supine to bilateral shoulders in to flexion, scaption and D2  Gentle STM/MFR to bilateral chest over tight musculature avoiding pacemaker on R chest  06/10/2023 Held Nustep and standing exs as pt still not feeling very well. Left chest intermittent sharp pain today  and with bad HA  STM to bil lateral chest wall/lateral trunk/UT over palpably tight musculature being mindful of pacemaker on Rt chest,  P/ROM in supine to bil shoulders into flex, scaption and D2 , and ER with scapular depression throughout Measured AROM and checked goals for recert  06/05/2023 Held exs due to recovering from fever and not feeling great today STM to bil lateral chest wall/trunk/UTover palpably tight musculature being mindful of pacemaker on Rt chest, and to right UT/scapular area in SL P/ROM in supine to bil shoulders into flex, scaption and D2 , and ER with scapular depression throughout Supine bilateral AROM flexion, scaption, horizontal abd x 5 Treatment decreased due to 2 bathroom breaks 05/29/23: Therapeutic Exercises NuStep level 5 x 1:45, level 4 for remaining time up to 6 mins, 430 steps; pt reports being challenged by this  Standing in // bars for bil hip 3 way raises x 10 each, pt returned therapist demo for each and VC's during for corrected LE position and to decrease trunk and pelvis compensations Supine Scapular series with red theraband x 10  each returning therapist demo for each; pt reports her arms felt tired after these so issued yellow for her to use initially at home  Manual Therapy STM to bil  lateral chest wall/trunk/UT over palpably tight musculature being mindful of pacemaker on Rt chest, and to right UT/scapular area in SL P/ROM in supine to bil shoulders into flex, scaption and D2 with scapular depression throughout  05/09/2023 STM to bil lateral chest wall/trunk/UTover palpably tight musculature being mindful of pacemaker on Rt chest, and to right UT/scapular area in SL P/ROM in supine to bil shoulders into flex, scaption and D2 , and ER with scapular depression throughout Supine bilateral AROM flexion, scaption, ABD Measured AROM Jobes flexion x 5   05/06/2023 Pulleys x 2 min flex and abd Ball rolls on wall x 10 Supine bilateral UE AROM flexion, scaption, horizntal abd x 5 STM to bil lateral chest wall/trunk/UTover palpably tight musculature being mindful of pacemaker on Rt chest, and to right UT/scapular area in SL MFR to left chest region and right  chest avoiding pacemaker Supine wand flexion and scaption x 5 P/ROM in supine to bil shoulders into flex, scaption and D2 , and ER with scapular depression throughout  05/01/23 STM to bil lateral chest wall/trunk/UTover palpably tight musculature being mindful of pacemaker on Rt chest, and to right UT/scapular area in SL Supine wand flexion and scaption x 5 P/ROM in supine to bil shoulders into flex, scaption and D2 , and ER with scapular depression throughout  04/29/23: Therapeutic Exercises Pulleys into flex and scaption x 2 mins each Roll yellow ball up wall into flexion x 10 and bil abd x 5 each returning therapist demo Supine over half foam for following: Bil UE horz abd, bil UE scaption into a "V" x 10 each; and then bil abd "snow angel" x 5, 5 sec holds  Manual Therapy P/ROM in supine to bil shoulders into flex, scaption and D2 with scapular depression throughout STM to bil lateral chest wall/trunk over palpably tight musculature being mindful of pacemaker on Rt chest  04/18/23: Manual Therapy P/ROM in supine to bil  shoulders into flex, scaption and D2 with scapular depression throughout STM to bil lateral chest wall/trunk over palpably tight musculature Therapeutic Exercises Pulleys into flex and scaption x 2 mins each returning therapist demo and VC's during to decr scapular compensation Lt UE NTS on wall as pt reports this UE tingles when she tries to lay on it at night; encouraged to do this only about 5 reps at a time and to not overstretch the nerve.  Roll yellow ball on wall for flexion x 10 returning therapist demo and VC's with demo to decr scap compensation throughout. Pt able to do this with cuing   04/15/23: In supine PROM to bilateral shoulders in to flexion, abduction and ER with increased tightness bilaterally and discomfort. Pt guarded throughout. At end of session pt had a spasm in her R pec that eased with STM and stretching. Instructed pt in seated pec stretch and encouraged her to try doorway or corner stretch at home as long as no sharp pain  04/09/2023 evaluation   PATIENT EDUCATION:  Education details: Standing bil hip 3 way raises and supine scapular series Person educated: Patient Education method: Explanation, demonstration, verbal cues, handout issued Education comprehension: verbalized understanding, returned demo, VC's, will benefit from further review  HOME EXERCISE PROGRAM: Pt encouraged to do the broomstick exercises and shoulder exercises she has previously been doing  Doorway or  corner stretch Standing bil LE hip 3 way raises and supine scapular series  ASSESSMENT:  CLINICAL IMPRESSION: Pt was challenged with exercises on half foam roll, but balance improved as she increased her repetitions. Shoulder ROM doing so much better, and pt using arms more at home. Energy levels greatly improved OBJECTIVE IMPAIRMENTS: cardiopulmonary status limiting activity, decreased activity tolerance, decreased endurance, decreased ROM, decreased strength, increased fascial restrictions,  impaired perceived functional ability, impaired UE functional use, and postural dysfunction.   ACTIVITY LIMITATIONS: carrying, lifting, standing, reach over head, and locomotion level  PARTICIPATION LIMITATIONS: meal prep, cleaning, laundry, and yard work  PERSONAL FACTORS: Age, Past/current experiences, and 3+ comorbidities: previous surgery, decreased cardiac status, onling chemotherapy   are also affecting patient's functional outcome.   REHAB POTENTIAL: Good  CLINICAL DECISION MAKING: Stable/uncomplicated  EVALUATION COMPLEXITY: Low  GOALS: Goals reviewed with patient? Yes  SHORT TERM GOALS: Target date: 05/09/2023  Pt will be independent in a basic exercise program for shoulder ROM and Strenth  Baseline:needs review Goal status:MET 06/10/2023 LONG TERM GOALS: Target date: 8/18.2024  Pt will decrease quick DASH score to < 20 indicating an improvement in functional use of her UE's Baseline: 45.45 Goal status: ONGOING  2.  Pt will have increased shoulder flextion of >  135 degrees in both shoulders so that she can use her arms better at home  Baseline: 110 on right and 120 on left  Goal status: Right MET 136, Left MET 06/27/2022  3.  Pt will report that she can put things on the top shelf of her cabinets at home without using a stepstool  Baseline: unable  Goal status: MET 06/28/2023  4.  Pt will increase to 10 reps of sit to stand in 30 seconds indicating an improvement in activity tolerance and functional strength  Baseline: 6 Goal status: Ongoing, did not test today due to not feeling well 06/10/23  PLAN:  PT FREQUENCY: 1-2x/week  PT DURATION:4 weeks PLANNED INTERVENTIONS: Therapeutic exercises, Therapeutic activity, Neuromuscular re-education, Balance training, Gait training, Patient/Family education, Self Care, Joint mobilization, scar mobilization, and Manual therapy  PLAN FOR NEXT SESSION: RECERT/DCCont manual work for soft tissue and scar mobilization, passive and  A/AA/ROM excise for shoulders; add strength, Cont nustep and functional endurance exercise   Waynette Buttery, PT 07/05/2023, 9:58 AM   [facility] [address] [place], [place], [address] Phone: [phone]   Fax:  [phone]         Cancer Rehab [phone] HIP: Flexion Standing    Holding onto counter lift leg straight in front keeping knee straight. Slow and controlled! _10__ reps per set, _2-3__ sets per day. Then repeat with other leg.   Hip Extension (Standing)    Stand with support at counter. Squeeze pelvic floor and hold so as not to twist hips and don't lean forward.  Move right leg backward with straight knee. Slow and controlled! Repeat _10__ times. Do _2-3__ times a day. Repeat with other leg.  Hip Abduction (Standing)    Stand with support. Squeeze pelvic floor and hold. Lift right leg out to side, keeping toe forward.  Repeat _10__ times. Do _2-3__ times a day. Repeat with other leg.    Over Head Pull: Narrow and Wide Grip   Start with yellow band for all then progress to red once this becomes easier.   On back, knees bent, feet flat, band across thighs, elbows straight but relaxed. Pull hands apart (start). Keeping elbows straight, bring  arms up and over head, hands toward floor. Keep pull steady on band. Hold momentarily. Return slowly, keeping pull steady, back to start. Then do same with a wider grip on the band (past shoulder width) Repeat _5-10__ times. Band color __red____   Side Pull: Double Arm   On back, knees bent, feet flat. Arms perpendicular to body, shoulder level, elbows straight but relaxed. Pull arms out to sides, elbows straight. Resistance band comes across collarbones, hands toward floor. Hold momentarily. Slowly return to starting position. Repeat _5-10__ times. Band color _red____   Sword   On back, knees bent, feet flat, left hand on left hip, right hand above left. Pull right arm DIAGONALLY  (hip to shoulder) across chest. Bring right arm along head toward floor. Hold momentarily. Slowly return to starting position. Repeat _5-10__ times. Do with left arm. Band color _red_____   Shoulder Rotation: Double Arm   On back, knees bent, feet flat, elbows tucked at sides, bent 90, hands palms up. Pull hands apart and down toward floor, keeping elbows near sides. Hold momentarily. Slowly return to starting position. Repeat _5-10__ times. Band color __red____

## 2023-07-10 ENCOUNTER — Encounter: Payer: Self-pay | Admitting: Hematology and Oncology

## 2023-07-10 DIAGNOSIS — H66002 Acute suppurative otitis media without spontaneous rupture of ear drum, left ear: Secondary | ICD-10-CM | POA: Diagnosis not present

## 2023-07-10 DIAGNOSIS — R197 Diarrhea, unspecified: Secondary | ICD-10-CM | POA: Diagnosis not present

## 2023-07-10 DIAGNOSIS — R112 Nausea with vomiting, unspecified: Secondary | ICD-10-CM | POA: Diagnosis not present

## 2023-07-10 NOTE — Telephone Encounter (Signed)
Called pt per MD to advise Signatera testing was negative/not detected. Pt verbalized understanding of results and knows [facility] will be in touch to schedule 3 mo repeat lab.

## 2023-07-12 ENCOUNTER — Ambulatory Visit (HOSPITAL_BASED_OUTPATIENT_CLINIC_OR_DEPARTMENT_OTHER)
Admission: RE | Admit: 2023-07-12 | Discharge: 2023-07-12 | Disposition: A | Discharge status: Home / Self Care | Payer: 59 | Source: Ambulatory Visit | Attending: Internal Medicine | Admitting: Internal Medicine

## 2023-07-12 ENCOUNTER — Ambulatory Visit (HOSPITAL_COMMUNITY)
Admission: RE | Admit: 2023-07-12 | Discharge: 2023-07-12 | Disposition: A | Discharge status: Home / Self Care | Payer: 59 | Source: Ambulatory Visit | Attending: Internal Medicine | Admitting: Internal Medicine

## 2023-07-12 ENCOUNTER — Telehealth: Payer: Self-pay | Admitting: *Deleted

## 2023-07-12 VITALS — BP 142/72 | HR 78 | Wt 185.4 lb

## 2023-07-12 DIAGNOSIS — I5032 Chronic diastolic (congestive) heart failure: Secondary | ICD-10-CM | POA: Diagnosis not present

## 2023-07-12 DIAGNOSIS — I5042 Chronic combined systolic (congestive) and diastolic (congestive) heart failure: Secondary | ICD-10-CM

## 2023-07-12 DIAGNOSIS — I340 Nonrheumatic mitral (valve) insufficiency: Secondary | ICD-10-CM | POA: Insufficient documentation

## 2023-07-12 DIAGNOSIS — C50412 Malignant neoplasm of upper-outer quadrant of left female breast: Secondary | ICD-10-CM

## 2023-07-12 DIAGNOSIS — I110 Hypertensive heart disease with heart failure: Secondary | ICD-10-CM | POA: Insufficient documentation

## 2023-07-12 DIAGNOSIS — I5022 Chronic systolic (congestive) heart failure: Secondary | ICD-10-CM | POA: Insufficient documentation

## 2023-07-12 DIAGNOSIS — I428 Other cardiomyopathies: Secondary | ICD-10-CM | POA: Diagnosis not present

## 2023-07-12 DIAGNOSIS — R002 Palpitations: Secondary | ICD-10-CM | POA: Diagnosis not present

## 2023-07-12 DIAGNOSIS — I447 Left bundle-branch block, unspecified: Secondary | ICD-10-CM

## 2023-07-12 DIAGNOSIS — Z171 Estrogen receptor negative status [ER-]: Secondary | ICD-10-CM | POA: Diagnosis not present

## 2023-07-12 LAB — ECHOCARDIOGRAM COMPLETE
Area-P 1/2: 4.06 cm2
S' Lateral: 4.7 cm

## 2023-07-12 NOTE — Telephone Encounter (Signed)
Informed by Dr Pamelia Hoit that per communication with Dr Gala Romney- noted drop in EF% and need to hold further herceptin/hylecta therapy.  This RN noted next appt  is for 10/3- called and informed pharmacy of above- orders are being discontinued at present.

## 2023-07-12 NOTE — Patient Instructions (Signed)
There has been no changes to your medications.  Your physician has requested that you have a cardiac MRI. Cardiac MRI uses a computer to create images of your heart as its beating, producing both still and moving pictures of your heart and major blood vessels. For further information please visit [email or web address]. Please follow the instruction sheet given to you today for more information. ONCE APPROVED BY YOUR INSURANCE COMPANY YOU WILL BE CALLED TO HAVE THE TEST ARRANGED.  Your physician recommends that you schedule a follow-up appointment in: 2 months  If you have any questions or concerns before your next appointment please send [place] a message through [employer] or call our office at [phone].    TO LEAVE A MESSAGE FOR THE NURSE SELECT OPTION 2, PLEASE LEAVE A MESSAGE INCLUDING: YOUR NAME DATE OF BIRTH CALL BACK NUMBER REASON FOR CALL**this is important as we prioritize the call backs  YOU WILL RECEIVE A CALL BACK THE SAME DAY AS LONG AS YOU CALL BEFORE 4:00 PM  At the [facility], you and your health needs are our priority. As part of our continuing mission to provide you with exceptional heart care, we have created designated Provider Care Teams. These Care Teams include your primary Cardiologist (physician) and Advanced Practice Providers (APPs- Physician Assistants and Nurse Practitioners) who all work together to provide you with the care you need, when you need it.   You may see any of the following providers on your designated Care Team at your next follow up: Dr Arvilla Meres Dr Marca Ancona Dr. Marcos Eke, NP Robbie Lis, [place] [facility] [place], [place] Brynda Peon, NP Karle Plumber, PharmD   Please be sure to bring in all your medications bottles to every appointment.    Thank you for choosing [facility]

## 2023-07-12 NOTE — Progress Notes (Signed)
CARDIO-ONCOLOGY CLINIC NOTE  Referring Physician: Dr. Odis Hollingshead Primary Care: Merri Brunette, MD Primary Cardiologist: Dr. Odis Hollingshead Oncologist: Dr. Pamelia Hoit  HPI:    Jordan Willis is a 72 y.o. with LBBB, DM2,recurrent breast cancer (BRCA +) status post bilateral mastectomies, chronic systolic HF due to NICM referred by Dr. Odis Hollingshead for further evaluation of her HF.    Diagnosed with L breast CA in 2006. 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  ER 90%, PR 0%, Ki-67 70%, HER2 3+ positive Left breast mastectomy scar reexcision: Benign   Echo 6/22 EF 50-55%   Back in June 2022 she had low normal LVEF and after recurrence of breast cancer  cancer she was placed on Herceptin for approximately 3 months  Jan 2023-April 2023.  LVEF in April 2023 was noted to be 35-40% and Herceptin was discontinued.  Despite stopping Herceptin her LVEF has not improved.  She has been on maximally tolerated GDMT and also underwent BiV ICD placement in November 2023.   She had a repeat echocardiogram in January 2024 without any significant improvement in LVEF. She follows Dr. Pamelia Hoit from hematology/oncology and wanted to consider a trial of anti-HER2 therapy (Kadcyla).  After discussion w/ Dr. Pamelia Hoit given the risk of worsening cardiotoxicity the shared decision was to continue monitoring/surveillance.  Saw Dr. Ladona Ridgel in 3/24 her CS did not have an acceptable vein for biv  pacing and her left bundle area pacing was initially good but currently was only capturing the septum so not getting effective Biv pacing. Discussed option for lead revision. Saw Dr. Ladona Ridgel again in 6/24. Device stable decided not to upgrade device   Echo 04/08/23  EF 30-35 with severe septal dyssynchrony  Here for f/u with her daughter and son. Now back on Herceptin since 4/24. Continues to get it every 3 weeks. Has had 2 clean cancer f/u scans. Feels pretty good. Denies CP or SOB. No edema.   Echo today 07/12/23 EF 20-25% with prominent LV trabeculations and significant dyssynchrony due to LBBB Personally reviewed    Cardiac studies:  Echocardiogram: 03/29/2021:  50-55%, G1DD, moderate MR, Moderate TR, PASP .    06/15/2021: LVEF 30-35%, severe hypokinesis of the anteroseptal and apex, moderate to severe LV dysfunction, grade 1 diastolic dysfunction, elevated LAP, global longitudinal strain -16.7%, mild to moderate Mr.    10/09/2021: 45-50%, no regional wall motion abnormalities, global longitudinal strain -13.2%, mild MR.    01/29/2022: 35-40%, average GLS -10.3%, grade 1 diastolic impairment, severe hypokinesis of the left ventricle with regional wall motion abnormalities, moderately dilated left atrium, posterior pericardial effusion without tamponade, moderate to severe MR, estimated RAP 8 mmHg.   10/29/2022 EF 30-35% RV low normal   Stress Testing:  02/14/2018 exercise nuclear stress test at [facility]: Achieved 7 METS, normal blood pressure response to exercise, had exertional chest pain that improved/resolved during recovery, normal myocardial perfusion study no EKG abnormalities per report.   Heart Catheterization: Left Heart Catheterization 03/27/22:  No CAD EF 30-35%   MUGA scan 05/10/2022: 1. Left ventricular ejection fraction is equal to 35.3%. 2. Moderate to marked septal hypokinesis. 3. Left ventricular dilatation.   Past Medical History:  Diagnosis Date    AICD (automatic cardioverter/defibrillator) present    Anemia    Anginal pain (HCC)    Anxiety    Arthritis    Lumbar spine DDD.   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   Diabetes mellitus without complication (HCC)    Type II   Dyspnea    When patient gets sick uses Pro-Air inhaler   Frequent sinus infections    GERD (gastroesophageal reflux disease)    occ   Goiter    History of cardiomegaly    History of cataract    Bilateral   History of kidney stones    08/28/2018 currently has a large kidney stone, asymptomatic at this time   History of migraine    History of uterine fibroid    History of uterine prolapse    Hyperlipidemia    Hypertension    LBBB (left bundle branch block)    Menopause 03/03/2012   Nasal turbinate hypertrophy 11/29/2016   Left inferior    Personal history of chemotherapy    Personal history of radiation therapy    Pneumonia    as a child   PONV (postoperative nausea and vomiting)    Sinusitis 2014   Sleep apnea    No CPAP   SUI (stress urinary incontinence, female)    SVD (spontaneous vaginal delivery)    x 2   Thyroid nodule     Current Outpatient Medications  Medication Sig Dispense Refill   albuterol (VENTOLIN HFA) 108 (90 Base) MCG/ACT inhaler Inhale 2 puffs into the lungs every 6 (six) hours as needed for wheezing or shortness of breath.     amoxicillin-clavulanate (AUGMENTIN) 875-125 MG tablet 1 tablet Orally every 12 hrs for 5 days     b complex vitamins capsule Take 1 capsule by mouth daily.     baclofen (LIORESAL) 10 MG tablet Take 10 mg by mouth as needed for muscle spasms.     bumetanide (BUMEX) 1 MG tablet TAKE 1 TABLET BY MOUTH EVERY DAY 90 tablet 1   cetirizine (ZYRTEC) 10 MG tablet Take 10 mg by mouth in the morning.     Cholecalciferol  (VITAMIN D3) 25 MCG (1000 UT) CAPS Take 1,000 Units by mouth in the morning.     diclofenac Sodium (VOLTAREN) 1 % GEL Apply 1 Application topically 4 (four) times daily as needed (pain).     exemestane (AROMASIN) 25 MG tablet TAKE 1 TABLET (25 MG TOTAL) BY MOUTH DAILY AFTER BREAKFAST. 90 tablet 3   FARXIGA 10 MG TABS tablet TAKE 1 TABLET BY MOUTH EVERY DAY BEFORE BREAKFAST 90 tablet 0   fluticasone (FLONASE) 50 MCG/ACT nasal spray Place 1 spray into both nostrils in the morning.     GLUCOSAMINE-CHONDROITIN PO Take 1 tablet by mouth every evening.      glucose blood (ONETOUCH VERIO) test strip TEST ONCE DAILY AS DIRECTED 90     ibuprofen (ADVIL) 200 MG tablet Take 400 mg by mouth every 6 (six) hours as needed for mild pain or moderate pain.     metFORMIN (GLUCOPHAGE-XR) 500 MG 24  hr tablet Take 500 mg by mouth 2 (two) times daily.     metoprolol succinate (TOPROL-XL) 25 MG 24 hr tablet TAKE 1 TABLET (25 MG TOTAL) BY MOUTH IN THE MORNING. HOLD IF SYSTOLIC BLOOD PRESSURE (TOP BLOOD PRESSURE NUMBER) LESS THAN 100 MMHG OR HEART RATE LESS THAN 60 BPM (PULSE). 90 tablet 3   Multiple Vitamin (MULTIVITAMIN WITH MINERALS) TABS tablet Take 1 tablet by mouth daily. Centrum Silver for Women 50+     nitroGLYCERIN (NITROSTAT) 0.4 MG SL tablet Place 0.4 mg under the tongue every 5 (five) minutes as needed for chest pain.     Olopatadine HCl 0.2 % SOLN Place 1 drop into both eyes daily. Pataday     ondansetron (ZOFRAN) 4 MG tablet Take 1 tablet (4 mg total) by mouth every 6 (six) hours as needed for nausea or vomiting. 20 tablet 0   OneTouch Delica Lancets 33G MISC Apply topically.     Probiotic Product (PROBIOTIC DAILY PO) Take 1 capsule by mouth in the morning.     rosuvastatin (CRESTOR) 20 MG tablet Take 20 mg by mouth at bedtime.     sacubitril-valsartan (ENTRESTO) 24-26 MG Take 1 tablet by mouth 2 (two) times daily. 60 tablet 6   sertraline (ZOLOFT) 50 MG tablet Take 50 mg by mouth in the morning.      spironolactone (ALDACTONE) 25 MG tablet TAKE 1 TABLET BY MOUTH EVERY DAY IN THE MORNING 90 tablet 0   No current facility-administered medications for this encounter.    Allergies  Allergen Reactions   Bactrim [Sulfamethoxazole-Trimethoprim] Shortness Of Breath    Shortness of breath, difficulty breathing, headache, rash, fatigue   Aspirin Palpitations and Other (See Comments)    Heart fluttering, pt takes 40.5 mg (half of 81 mg) daily Other reaction(s): bradycardia, Not available   Sulfa Antibiotics Hives   Tape Rash    Tears skin off      Social History   Socioeconomic History   Marital status: Divorced    Spouse name: n/a   Number of children: 2   Years of education: 12   Highest education level: Not on file  Occupational History   Occupation: Retired    Associate Professor: [employer]    Comment: Diplomatic Services operational officer  Tobacco Use   Smoking status: Never   Smokeless tobacco: Never  Vaping Use   Vaping status: Never Used  Substance and Sexual Activity   Alcohol use: No   Drug use: No   Sexual activity: Not Currently    Partners: Male    Birth control/protection: Surgical, Post-menopausal    Comment: Hysterectomy  Other Topics Concern   Not on file  Social History Narrative   Marital status: divorced; dating seriously x many years.       Children:  2 children; 1 grandchild.      Employment: Research officer, political party.      Lives: alone      Tobacco: none       Alcohol: none      Exercise:  Walking with sister two days per week.      Seatbelt: 100%      Guns: none      Right-handed   Caffeine: occasional coffee, hot tea or green tea a few times per week   Social Determinants of Health   Financial Resource Strain: Not on file  Food Insecurity: Not on file  Transportation Needs: Not on file  Physical Activity: Not on file  Stress: Not on file  Social Connections: Not on file  Intimate Partner Violence: Not on file      Family History  Problem Relation Age of Onset    Cancer Mother 31       breast cancer   Hypertension Mother    Diabetes Father    Cancer Sister        breast cancer   Breast cancer Sister    Cancer Sister        breast cancer   Breast cancer Sister     Vitals:   07/12/23 1512  BP: (!) 142/72  Pulse: 78  SpO2: 98%  Weight: 84.1 kg (185 lb 6.4 oz)    PHYSICAL EXAM: General:  Elderly No resp difficulty HEENT: normal Neck: supple. no JVD. Carotids 2+ bilat; no bruits. No lymphadenopathy or thryomegaly appreciated. Cor: PMI nondisplaced. Regular rate & rhythm. No rubs, gallops or murmurs. Lungs: clear Abdomen: soft, nontender, nondistended. No hepatosplenomegaly. No bruits or masses. Good bowel sounds. Extremities: no cyanosis, clubbing, rash, edema Neuro: alert & orientedx3, cranial nerves grossly intact. moves all 4 extremities w/o difficulty. Affect pleasant  ASSESSMENT & PLAN:  1. Chronic systolic HF due to NICM  - Echo 6/22: EF 50-55%, G1DD, moderate MR, Moderate TR, PASP .  - Echo 8.22: EF 30-35%, severe anteroseptal/apical HK, G1DD mild-mod MR - Echo 4/23: EF 35-40% G1DD mod-sev MR - MUGA 7/23 EF 35% Moderate to marked septal hypokinesis. - Cath 6/23 No CAD EF 30-35% - Echo 1/24 EF 30-35% RV low normal triv MR - Echo 04/08/23  EF 30%  - Echo today 07/12/23 EF 20-25% with prominent LV trabeculations and significant dyssynchrony due to LBBB Personally reviewed - Stable NYHA II - Volume status looks ok  - Continue current doses of Entresto, Toprol, spiro and Farxiga (BP high here but has been low at home)  - EF getting worse suspect this is primarily LBBB cardiomyopathy but can't rule out component of Herceptin-mediated toxicity +/- potential LVNC - Will stop Herceptin (I discussed with Dr. Pamelia Hoit) and proceed with cMRI. If significant dyssynchrony on cMRI will d/w EP regarding another attempt at CRT  2. Recurrent breast CA - plan as above - hold Herceptin   3. LBBB - as above  Total time spent 45 minutes.  Over half that time spent discussing above.    Arvilla Meres, MD  3:43 PM

## 2023-07-13 DIAGNOSIS — J40 Bronchitis, not specified as acute or chronic: Secondary | ICD-10-CM | POA: Diagnosis not present

## 2023-07-13 DIAGNOSIS — R509 Fever, unspecified: Secondary | ICD-10-CM | POA: Diagnosis not present

## 2023-07-13 DIAGNOSIS — R052 Subacute cough: Secondary | ICD-10-CM | POA: Diagnosis not present

## 2023-07-13 DIAGNOSIS — J0100 Acute maxillary sinusitis, unspecified: Secondary | ICD-10-CM | POA: Diagnosis not present

## 2023-07-13 DIAGNOSIS — H6502 Acute serous otitis media, left ear: Secondary | ICD-10-CM | POA: Diagnosis not present

## 2023-07-13 DIAGNOSIS — Z950 Presence of cardiac pacemaker: Secondary | ICD-10-CM | POA: Diagnosis not present

## 2023-07-13 DIAGNOSIS — H1044 Vernal conjunctivitis: Secondary | ICD-10-CM | POA: Diagnosis not present

## 2023-07-13 DIAGNOSIS — J302 Other seasonal allergic rhinitis: Secondary | ICD-10-CM | POA: Diagnosis not present

## 2023-07-15 DIAGNOSIS — R002 Palpitations: Secondary | ICD-10-CM | POA: Diagnosis not present

## 2023-07-16 ENCOUNTER — Ambulatory Visit: Payer: 59

## 2023-07-17 ENCOUNTER — Encounter (HOSPITAL_COMMUNITY)
Admission: RE | Admit: 2023-07-17 | Discharge: 2023-07-17 | Disposition: A | Discharge status: Home / Self Care | Payer: 59 | Source: Ambulatory Visit | Attending: Hematology and Oncology | Admitting: Hematology and Oncology

## 2023-07-17 DIAGNOSIS — C50412 Malignant neoplasm of upper-outer quadrant of left female breast: Secondary | ICD-10-CM | POA: Diagnosis not present

## 2023-07-17 DIAGNOSIS — C50919 Malignant neoplasm of unspecified site of unspecified female breast: Secondary | ICD-10-CM | POA: Diagnosis not present

## 2023-07-17 DIAGNOSIS — Z171 Estrogen receptor negative status [ER-]: Secondary | ICD-10-CM | POA: Insufficient documentation

## 2023-07-17 DIAGNOSIS — C7951 Secondary malignant neoplasm of bone: Secondary | ICD-10-CM | POA: Diagnosis not present

## 2023-07-17 DIAGNOSIS — R0781 Pleurodynia: Secondary | ICD-10-CM | POA: Diagnosis not present

## 2023-07-17 MED ORDER — TECHNETIUM TC 99M MEDRONATE IV KIT
20.0000 | PACK | Freq: Once | INTRAVENOUS | Status: AC | PRN
  Administered 2023-07-17: 18.7 via INTRAVENOUS

## 2023-07-18 ENCOUNTER — Ambulatory Visit: Payer: 59 | Admitting: Rehabilitation

## 2023-07-19 ENCOUNTER — Ambulatory Visit (HOSPITAL_COMMUNITY)
Admission: RE | Admit: 2023-07-19 | Discharge: 2023-07-19 | Disposition: A | Discharge status: Home / Self Care | Payer: 59 | Source: Ambulatory Visit | Attending: Hematology and Oncology

## 2023-07-19 DIAGNOSIS — I700 Atherosclerosis of aorta: Secondary | ICD-10-CM | POA: Diagnosis not present

## 2023-07-19 DIAGNOSIS — C50412 Malignant neoplasm of upper-outer quadrant of left female breast: Secondary | ICD-10-CM | POA: Diagnosis not present

## 2023-07-19 DIAGNOSIS — C50919 Malignant neoplasm of unspecified site of unspecified female breast: Secondary | ICD-10-CM | POA: Diagnosis not present

## 2023-07-19 DIAGNOSIS — Z171 Estrogen receptor negative status [ER-]: Secondary | ICD-10-CM | POA: Insufficient documentation

## 2023-07-19 DIAGNOSIS — C7951 Secondary malignant neoplasm of bone: Secondary | ICD-10-CM | POA: Diagnosis not present

## 2023-07-19 DIAGNOSIS — N281 Cyst of kidney, acquired: Secondary | ICD-10-CM | POA: Diagnosis not present

## 2023-07-19 MED ORDER — IOHEXOL 300 MG/ML  SOLN
100.0000 mL | Freq: Once | INTRAMUSCULAR | Status: AC | PRN
  Administered 2023-07-19: 100 mL via INTRAVENOUS

## 2023-07-19 MED ORDER — SODIUM CHLORIDE (PF) 0.9 % IJ SOLN
INTRAMUSCULAR | Status: AC
  Filled 2023-07-19: qty 50

## 2023-07-22 ENCOUNTER — Other Ambulatory Visit (HOSPITAL_COMMUNITY): Payer: Self-pay | Admitting: Internal Medicine

## 2023-07-24 ENCOUNTER — Encounter: Payer: Self-pay | Admitting: *Deleted

## 2023-07-24 NOTE — Progress Notes (Signed)
Per MD pt upcoming infusion appts needing to be canceled due to decreased EF and pt needing to f/u after next visit with Dr. Gala Romney.  Message sent to scheduling team.

## 2023-07-25 ENCOUNTER — Inpatient Hospital Stay: Payer: 59

## 2023-07-25 ENCOUNTER — Other Ambulatory Visit: Payer: 59

## 2023-07-25 ENCOUNTER — Ambulatory Visit: Payer: 59 | Admitting: Hematology and Oncology

## 2023-07-25 DIAGNOSIS — H04123 Dry eye syndrome of bilateral lacrimal glands: Secondary | ICD-10-CM | POA: Diagnosis not present

## 2023-07-25 DIAGNOSIS — E119 Type 2 diabetes mellitus without complications: Secondary | ICD-10-CM | POA: Diagnosis not present

## 2023-07-25 DIAGNOSIS — H524 Presbyopia: Secondary | ICD-10-CM | POA: Diagnosis not present

## 2023-07-26 ENCOUNTER — Encounter (HOSPITAL_COMMUNITY): Payer: Self-pay | Admitting: *Deleted

## 2023-07-26 NOTE — Progress Notes (Signed)
Pt's ICD is not compatible for cMRI, Dr Gala Romney aware, order cancelled

## 2023-07-29 DIAGNOSIS — Z170 Estrogen receptor positive status [ER+]: Secondary | ICD-10-CM | POA: Diagnosis not present

## 2023-07-29 DIAGNOSIS — Z853 Personal history of malignant neoplasm of breast: Secondary | ICD-10-CM | POA: Diagnosis not present

## 2023-07-29 DIAGNOSIS — C50411 Malignant neoplasm of upper-outer quadrant of right female breast: Secondary | ICD-10-CM | POA: Diagnosis not present

## 2023-07-30 ENCOUNTER — Telehealth: Payer: Self-pay | Admitting: Hematology and Oncology

## 2023-07-30 NOTE — Telephone Encounter (Signed)
Per scheduling message sent on 10/2 patient is aware of scheduled appointment times/dates for follow up with [facility]

## 2023-08-02 DIAGNOSIS — E118 Type 2 diabetes mellitus with unspecified complications: Secondary | ICD-10-CM | POA: Diagnosis not present

## 2023-08-02 DIAGNOSIS — Z853 Personal history of malignant neoplasm of breast: Secondary | ICD-10-CM | POA: Diagnosis not present

## 2023-08-02 DIAGNOSIS — I10 Essential (primary) hypertension: Secondary | ICD-10-CM | POA: Diagnosis not present

## 2023-08-02 DIAGNOSIS — Z950 Presence of cardiac pacemaker: Secondary | ICD-10-CM | POA: Diagnosis not present

## 2023-08-06 ENCOUNTER — Encounter: Payer: Self-pay | Admitting: Internal Medicine

## 2023-08-07 ENCOUNTER — Telehealth: Payer: Self-pay

## 2023-08-07 NOTE — Telephone Encounter (Signed)
-----   Message from Noreene Filbert sent at 08/06/2023  8:43 PM EDT ----- Scans show no new sites of cancer, just the one treated site in the sternum.  this is good news. ----- Message ----- From: Interface, Rad Results In Sent: 08/06/2023   5:01 PM EDT To: Serena Croissant, MD

## 2023-08-07 NOTE — Telephone Encounter (Signed)
Called pt per Np to let her know she has no new sites of cancer, just the one treated site in sternum. Pt verbalized and understanding.

## 2023-08-15 ENCOUNTER — Inpatient Hospital Stay: Payer: 59

## 2023-08-27 ENCOUNTER — Telehealth: Payer: Self-pay

## 2023-08-27 ENCOUNTER — Encounter: Payer: Self-pay | Admitting: Hematology and Oncology

## 2023-08-27 ENCOUNTER — Encounter (HOSPITAL_COMMUNITY): Payer: Self-pay | Admitting: Internal Medicine

## 2023-08-27 ENCOUNTER — Encounter: Payer: Self-pay | Admitting: Internal Medicine

## 2023-08-27 ENCOUNTER — Ambulatory Visit: Payer: 59

## 2023-08-27 ENCOUNTER — Ambulatory Visit (INDEPENDENT_AMBULATORY_CARE_PROVIDER_SITE_OTHER): Payer: 59

## 2023-08-27 DIAGNOSIS — I428 Other cardiomyopathies: Secondary | ICD-10-CM

## 2023-08-27 DIAGNOSIS — I10 Essential (primary) hypertension: Secondary | ICD-10-CM | POA: Diagnosis not present

## 2023-08-27 DIAGNOSIS — I5022 Chronic systolic (congestive) heart failure: Secondary | ICD-10-CM

## 2023-08-27 DIAGNOSIS — E7800 Pure hypercholesterolemia, unspecified: Secondary | ICD-10-CM | POA: Diagnosis not present

## 2023-08-27 DIAGNOSIS — Z0001 Encounter for general adult medical examination with abnormal findings: Secondary | ICD-10-CM | POA: Diagnosis not present

## 2023-08-27 DIAGNOSIS — H04123 Dry eye syndrome of bilateral lacrimal glands: Secondary | ICD-10-CM | POA: Diagnosis not present

## 2023-08-27 DIAGNOSIS — Z961 Presence of intraocular lens: Secondary | ICD-10-CM | POA: Diagnosis not present

## 2023-08-27 DIAGNOSIS — E042 Nontoxic multinodular goiter: Secondary | ICD-10-CM | POA: Diagnosis not present

## 2023-08-27 DIAGNOSIS — E1165 Type 2 diabetes mellitus with hyperglycemia: Secondary | ICD-10-CM | POA: Diagnosis not present

## 2023-08-27 NOTE — Telephone Encounter (Signed)
Jordan Willis, [place]      08/27/23 12:18 PM Note I spoke with the patient and called [employer] tech support to get additional help with the patient monitor. [employer] is sending the patient a new cell adapter for the patient.

## 2023-08-27 NOTE — Telephone Encounter (Signed)
I spoke with the patient and called [employer] tech support to get additional help with the patient monitor. [employer] is sending the patient a new cell adapter for the patient.

## 2023-08-27 NOTE — Telephone Encounter (Signed)
@  device clinic

## 2023-09-02 ENCOUNTER — Telehealth: Payer: Self-pay

## 2023-09-02 LAB — CUP PACEART REMOTE DEVICE CHECK
Battery Remaining Longevity: 80 mo
Battery Remaining Percentage: 82 %
Battery Voltage: 3.02 V
Date Time Interrogation Session: 20241105020015
HighPow Impedance: 54 Ohm
HighPow Impedance: 54 Ohm
Implantable Lead Connection Status: 753985
Implantable Lead Connection Status: 753985
Implantable Lead Connection Status: 753985
Implantable Lead Implant Date: 20231103
Implantable Lead Implant Date: 20231103
Implantable Lead Implant Date: 20231103
Implantable Lead Location: 753858
Implantable Lead Location: 753859
Implantable Lead Location: 753860
Implantable Lead Model: 3830
Implantable Pulse Generator Implant Date: 20231103
Lead Channel Impedance Value: 260 Ohm
Lead Channel Impedance Value: 410 Ohm
Lead Channel Impedance Value: 430 Ohm
Lead Channel Pacing Threshold Amplitude: 0.75 V
Lead Channel Pacing Threshold Amplitude: 0.75 V
Lead Channel Pacing Threshold Pulse Width: 0.5 ms
Lead Channel Pacing Threshold Pulse Width: 0.5 ms
Lead Channel Sensing Intrinsic Amplitude: 1.2 mV
Lead Channel Sensing Intrinsic Amplitude: 10.4 mV
Lead Channel Setting Pacing Amplitude: 2.5 V
Lead Channel Setting Pacing Amplitude: 2.5 V
Lead Channel Setting Pacing Pulse Width: 0.5 ms
Lead Channel Setting Pacing Pulse Width: 0.5 ms
Lead Channel Setting Sensing Sensitivity: 0.5 mV
Pulse Gen Serial Number: 5548670
Zone Setting Status: 755011

## 2023-09-02 NOTE — Telephone Encounter (Signed)
Scheduled remote reviewed. Normal device function.   1 AT/AF 10/2, 24sec in duration, no hx of PAF or OAC noted in EPIC - route to triage Next remote 91 days.

## 2023-09-03 DIAGNOSIS — E1165 Type 2 diabetes mellitus with hyperglycemia: Secondary | ICD-10-CM | POA: Diagnosis not present

## 2023-09-03 DIAGNOSIS — E7800 Pure hypercholesterolemia, unspecified: Secondary | ICD-10-CM | POA: Diagnosis not present

## 2023-09-03 DIAGNOSIS — H5213 Myopia, bilateral: Secondary | ICD-10-CM | POA: Diagnosis not present

## 2023-09-03 DIAGNOSIS — E059 Thyrotoxicosis, 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: Secondary | ICD-10-CM | POA: Diagnosis not present

## 2023-09-03 DIAGNOSIS — E042 Nontoxic multinodular goiter: Secondary | ICD-10-CM | POA: Diagnosis not present

## 2023-09-03 DIAGNOSIS — H524 Presbyopia: Secondary | ICD-10-CM | POA: Diagnosis not present

## 2023-09-04 ENCOUNTER — Other Ambulatory Visit (HOSPITAL_COMMUNITY): Payer: Self-pay | Admitting: Endocrinology

## 2023-09-04 DIAGNOSIS — E042 Nontoxic multinodular goiter: Secondary | ICD-10-CM

## 2023-09-05 ENCOUNTER — Ambulatory Visit: Payer: 59 | Admitting: Hematology and Oncology

## 2023-09-05 ENCOUNTER — Other Ambulatory Visit: Payer: 59

## 2023-09-05 ENCOUNTER — Ambulatory Visit: Payer: 59

## 2023-09-05 NOTE — Telephone Encounter (Signed)
I would not start a blood thinner yet. We will see if her atrial fib progresses. If she has more than an hour, I would then start a blood thinner.

## 2023-09-12 ENCOUNTER — Encounter (HOSPITAL_COMMUNITY)
Admission: RE | Admit: 2023-09-12 | Discharge: 2023-09-12 | Disposition: A | Discharge status: Home / Self Care | Payer: 59 | Source: Ambulatory Visit | Attending: Endocrinology | Admitting: Endocrinology

## 2023-09-12 ENCOUNTER — Ambulatory Visit: Payer: Self-pay | Admitting: Cardiology

## 2023-09-12 DIAGNOSIS — E042 Nontoxic multinodular goiter: Secondary | ICD-10-CM | POA: Insufficient documentation

## 2023-09-12 MED ORDER — SODIUM IODIDE I-123 7.4 MBQ CAPS
440.0000 | ORAL_CAPSULE | Freq: Once | ORAL | Status: AC
  Administered 2023-09-12: 440 via ORAL

## 2023-09-13 ENCOUNTER — Encounter (HOSPITAL_COMMUNITY)
Admission: RE | Admit: 2023-09-13 | Discharge: 2023-09-13 | Disposition: A | Discharge status: Home / Self Care | Payer: 59 | Source: Ambulatory Visit | Attending: Endocrinology | Admitting: Endocrinology

## 2023-09-13 DIAGNOSIS — E042 Nontoxic multinodular goiter: Secondary | ICD-10-CM | POA: Diagnosis not present

## 2023-09-15 NOTE — Progress Notes (Signed)
CARDIO-ONCOLOGY CLINIC NOTE  Referring Physician: Dr. Odis Hollingshead Primary Care: Merri Brunette, MD Primary Cardiologist: Dr. Odis Hollingshead Oncologist: Dr. Pamelia Hoit  HPI:    Jordan Willis is a 72 y.o. with LBBB, DM2,recurrent breast cancer (BRCA +) status post bilateral mastectomies, chronic systolic HF due to NICM referred by Dr. Odis Hollingshead for further evaluation of her HF.    Diagnosed with L breast CA in 2006. 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  ER 90%, PR 0%, Ki-67 70%, HER2 3+ positive Left breast mastectomy scar reexcision: Benign   Echo 6/22 EF 50-55%   Back in June 2022 she had low normal LVEF and after recurrence of breast cancer  cancer she was placed on Herceptin for approximately 3 months  Jan 2023-April 2023.  LVEF in April 2023 was noted to be 35-40% and Herceptin was discontinued.  Despite stopping Herceptin her LVEF has not improved. Had ICD placed.  Cath 6/23 No CAD EF 25-35%   She had a repeat echocardiogram in January 2024 without any significant improvement in LVEF. She follows Dr. Pamelia Hoit from hematology/oncology and wanted to consider a trial of anti-HER2 therapy (Kadcyla).  After discussion w/ Dr. Pamelia Hoit given the risk of worsening cardiotoxicity the shared decision was to continue monitoring/surveillance.  Saw Dr. Ladona Ridgel in 3/24 her CS did not have an acceptable vein for biv pacing and her left bundle area pacing was initially  good but currently was only capturing the septum so not getting effective Biv pacing. Discussed option for lead revision. Saw Dr. Ladona Ridgel again in 6/24. Device stable decided not to upgrade device   Echo 04/08/23  EF 30-35% with severe septal dyssynchrony  Echo 07/12/23 EF 20-25% with prominent LV trabeculations and significant dyssynchrony due to LBBB Personally reviewed  Here for f/u with her daughter. Remains off Herceptin due to low EF.  On remote device interrogation had one episode AT/AF for 24 sec on 10/10. D/w Dr. Ladona Ridgel and decided to hold off on [facility]. Feels tired and gets SOB easy. No edema, orthopnea or PND. Has to use cart to walk around store  ICD interrogation today: No recurrent AF. No VT. Fluid ok. Personally reviewed    Cardiac studies:  Echocardiogram: 03/29/2021:  50-55%, G1DD, moderate MR, Moderate TR, PASP .    06/15/2021: LVEF 30-35%, severe hypokinesis of the anteroseptal and apex, moderate to severe LV dysfunction, grade 1 diastolic dysfunction, elevated LAP, global longitudinal strain -16.7%, mild to moderate Mr.    10/09/2021: 45-50%, no regional wall motion abnormalities, global longitudinal strain -13.2%, mild MR.    01/29/2022: 35-40%, average GLS -10.3%, grade 1 diastolic impairment, severe hypokinesis of the left ventricle with regional wall motion abnormalities, moderately dilated left atrium, posterior pericardial effusion without tamponade, moderate to severe MR, estimated RAP 8 mmHg.   10/29/2022 EF 30-35% RV low normal   Stress Testing:  02/14/2018 exercise nuclear stress test at [facility]: Achieved 7 METS, normal blood pressure response to exercise, had  exertional chest pain that improved/resolved during recovery, normal myocardial perfusion study no EKG abnormalities per report.   Heart Catheterization: Left Heart Catheterization 03/27/22: No CAD EF 30-35%   MUGA scan 05/10/2022: 1. Left ventricular ejection fraction is equal to  35.3%. 2. Moderate to marked septal hypokinesis. 3. Left ventricular dilatation.   Past Medical History:  Diagnosis Date   AICD (automatic cardioverter/defibrillator) present    Anemia    Anginal pain (HCC)    Anxiety    Arthritis    Lumbar spine DDD.   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   Diabetes mellitus without complication (HCC)    Type II   Dyspnea    When patient gets sick uses Pro-Air inhaler   Frequent sinus infections    GERD (gastroesophageal reflux disease)    occ   Goiter    History of cardiomegaly    History of cataract    Bilateral   History of kidney stones    08/28/2018 currently has a large kidney stone, asymptomatic at this time   History of migraine    History of uterine fibroid    History of uterine prolapse    Hyperlipidemia    Hypertension    LBBB (left bundle branch block)    Menopause 03/03/2012   Nasal turbinate hypertrophy 11/29/2016   Left inferior    Personal history of chemotherapy    Personal history of radiation therapy    Pneumonia    as a child   PONV (postoperative nausea and vomiting)    Sinusitis 2014   Sleep apnea    No CPAP   SUI (stress urinary incontinence, female)    SVD (spontaneous vaginal delivery)    x 2   Thyroid nodule     Current Outpatient Medications  Medication Sig Dispense Refill   albuterol (VENTOLIN HFA) 108 (90 Base) MCG/ACT inhaler Inhale 2 puffs into the lungs every 6 (six) hours as needed for wheezing or shortness of breath.     b complex vitamins capsule Take 1 capsule by mouth daily.     baclofen (LIORESAL) 10 MG tablet Take 10 mg by mouth as needed for muscle spasms.     bumetanide (BUMEX) 1 MG tablet TAKE 1 TABLET BY MOUTH EVERY DAY 90 tablet 1   cetirizine (ZYRTEC) 10 MG tablet Take 10 mg by mouth in the morning.      Cholecalciferol (VITAMIN D3) 25 MCG (1000 UT) CAPS Take 1,000 Units by mouth in the morning.     diclofenac Sodium (VOLTAREN) 1 % GEL Apply 1 Application topically 4 (four) times daily as needed (pain).     exemestane (AROMASIN) 25 MG tablet TAKE 1 TABLET (25 MG TOTAL) BY MOUTH DAILY AFTER BREAKFAST. 90 tablet 3   FARXIGA 10 MG TABS tablet TAKE 1 TABLET BY MOUTH EVERY DAY BEFORE BREAKFAST 90 tablet 0   fluticasone (FLONASE) 50 MCG/ACT nasal spray Place 1 spray into both nostrils in the morning.     GLUCOSAMINE-CHONDROITIN PO Take 1 tablet by mouth every evening.      glucose blood (ONETOUCH VERIO) test strip TEST ONCE DAILY AS DIRECTED 90     ibuprofen (ADVIL) 200 MG tablet Take 400 mg by mouth every 6 (six) hours as needed for mild pain or moderate pain.  metFORMIN (GLUCOPHAGE-XR) 500 MG 24 hr tablet Take 500 mg by mouth 2 (two) times daily.     metoprolol succinate (TOPROL-XL) 25 MG 24 hr tablet TAKE 1 TABLET (25 MG TOTAL) BY MOUTH IN THE MORNING. HOLD IF SYSTOLIC BLOOD PRESSURE (TOP BLOOD PRESSURE NUMBER) LESS THAN 100 MMHG OR HEART RATE LESS THAN 60 BPM (PULSE). 90 tablet 3   Multiple Vitamin (MULTIVITAMIN WITH MINERALS) TABS tablet Take 1 tablet by mouth daily. Centrum Silver for Women 50+     nitroGLYCERIN (NITROSTAT) 0.4 MG SL tablet Place 0.4 mg under the tongue every 5 (five) minutes as needed for chest pain.     Olopatadine HCl 0.2 % SOLN Place 1 drop into both eyes daily. Pataday     ondansetron (ZOFRAN) 4 MG tablet Take 1 tablet (4 mg total) by mouth every 6 (six) hours as needed for nausea or vomiting. 20 tablet 0   OneTouch Delica Lancets 33G MISC Apply topically.     Probiotic Product (PROBIOTIC DAILY PO) Take 1 capsule by mouth in the morning.     Propylene Glycol (SYSTANE BALANCE OP) Apply 1 drop to eye 2 (two) times daily.     rosuvastatin (CRESTOR) 20 MG tablet Take 20 mg by mouth at bedtime.     sacubitril-valsartan (ENTRESTO) 24-26 MG Take 1 tablet by mouth 2 (two) times  daily. 60 tablet 6   sertraline (ZOLOFT) 50 MG tablet Take 50 mg by mouth in the morning.     spironolactone (ALDACTONE) 25 MG tablet TAKE 1 TABLET BY MOUTH EVERY DAY IN THE MORNING 90 tablet 0   No current facility-administered medications for this encounter.    Allergies  Allergen Reactions   Bactrim [Sulfamethoxazole-Trimethoprim] Shortness Of Breath    Shortness of breath, difficulty breathing, headache, rash, fatigue   Aspirin Palpitations and Other (See Comments)    Heart fluttering, pt takes 40.5 mg (half of 81 mg) daily Other reaction(s): bradycardia, Not available   Sulfa Antibiotics Hives   Tape Rash    Tears skin off      Social History   Socioeconomic History   Marital status: Divorced    Spouse name: n/a   Number of children: 2   Years of education: 12   Highest education level: Not on file  Occupational History   Occupation: Retired    Associate Professor: [employer]    Comment: Diplomatic Services operational officer  Tobacco Use   Smoking status: Never   Smokeless tobacco: Never  Vaping Use   Vaping status: Never Used  Substance and Sexual Activity   Alcohol use: No   Drug use: No   Sexual activity: Not Currently    Partners: Male    Birth control/protection: Surgical, Post-menopausal    Comment: Hysterectomy  Other Topics Concern   Not on file  Social History Narrative   Marital status: divorced; dating seriously x many years.       Children:  2 children; 1 grandchild.      Employment: Research officer, political party.      Lives: alone      Tobacco: none       Alcohol: none      Exercise:  Walking with sister two days per week.      Seatbelt: 100%      Guns: none      Right-handed   Caffeine: occasional coffee, hot tea or green tea a few times per week   Social Determinants of Health   Financial Resource Strain: Not on file  Food Insecurity: Not on file  Transportation Needs: Not on file  Physical Activity: Not on file  Stress: Not on file  Social Connections: Not on file   Intimate Partner Violence: Not on file      Family History  Problem Relation Age of Onset   Cancer Mother 37       breast cancer   Hypertension Mother    Diabetes Father    Cancer Sister        breast cancer   Breast cancer Sister    Cancer Sister        breast cancer   Breast cancer Sister     Vitals:   09/16/23 0903  BP: 110/70  Pulse: 89  SpO2: 97%  Weight: 83.3 kg (183 lb 9.6 oz)     PHYSICAL EXAM: General:  Elderly No resp difficulty HEENT: normal Neck: supple. no JVD. Carotids 2+ bilat; no bruits. No lymphadenopathy or thryomegaly appreciated. Cor: PMI nondisplaced. Regular rate & rhythm. No rubs, gallops or murmurs. Lungs: clear Abdomen: soft, nontender, nondistended. No hepatosplenomegaly. No bruits or masses. Good bowel sounds. Extremities: no cyanosis, clubbing, rash, edema Neuro: alert & orientedx3, cranial nerves grossly intact. moves all 4 extremities w/o difficulty. Affect pleasant   ASSESSMENT & PLAN:  1. Chronic systolic HF due to NICM  - Echo 6/22: EF 50-55%, G1DD, moderate MR, Moderate TR, PASP .  - Echo 8.22: EF 30-35%, severe anteroseptal/apical HK, G1DD mild-mod MR - Echo 4/23: EF 35-40% G1DD mod-sev MR - MUGA 7/23 EF 35% Moderate to marked septal hypokinesis. - Cath 6/23 No CAD EF 30-35% - Echo 1/24 EF 30-35% RV low normal triv MR - Echo 04/08/23  EF 30%  - Echo today 07/12/23 EF 20-25% with prominent LV trabeculations and significant dyssynchrony due to LBBB Personally reviewed - Stable NYHA III - Volume status ok - Continue current doses of Entresto, Toprol, spiro and Farxiga - EF getting worse suspect this is primarily LBBB cardiomyopathy but can't rule out component of Herceptin-mediated toxicity +/- potential LVNC - Unable to get cMRI due to ICD incompatibility - Will arrange f/u with Dr. Ladona Ridgel to consider repeat attempt at CRT  2. Recurrent breast CA - plan as above - hold Herceptin  3. LBBB - as above - QRS >    Arvilla Meres, MD  9:23 AM

## 2023-09-16 ENCOUNTER — Encounter (HOSPITAL_COMMUNITY): Payer: Self-pay | Admitting: Internal Medicine

## 2023-09-16 ENCOUNTER — Ambulatory Visit (HOSPITAL_COMMUNITY)
Admission: RE | Admit: 2023-09-16 | Discharge: 2023-09-16 | Disposition: A | Discharge status: Home / Self Care | Payer: 59 | Source: Ambulatory Visit | Attending: Internal Medicine | Admitting: Internal Medicine

## 2023-09-16 VITALS — BP 110/70 | HR 89 | Wt 183.6 lb

## 2023-09-16 DIAGNOSIS — R0602 Shortness of breath: Secondary | ICD-10-CM | POA: Insufficient documentation

## 2023-09-16 DIAGNOSIS — Z7984 Long term (current) use of oral hypoglycemic drugs: Secondary | ICD-10-CM | POA: Diagnosis not present

## 2023-09-16 DIAGNOSIS — Z853 Personal history of malignant neoplasm of breast: Secondary | ICD-10-CM | POA: Diagnosis not present

## 2023-09-16 DIAGNOSIS — I5022 Chronic systolic (congestive) heart failure: Secondary | ICD-10-CM | POA: Diagnosis not present

## 2023-09-16 DIAGNOSIS — E1136 Type 2 diabetes mellitus with diabetic cataract: Secondary | ICD-10-CM | POA: Diagnosis not present

## 2023-09-16 DIAGNOSIS — I428 Other cardiomyopathies: Secondary | ICD-10-CM | POA: Diagnosis not present

## 2023-09-16 DIAGNOSIS — I110 Hypertensive heart disease with heart failure: Secondary | ICD-10-CM | POA: Diagnosis not present

## 2023-09-16 DIAGNOSIS — I5032 Chronic diastolic (congestive) heart failure: Secondary | ICD-10-CM | POA: Diagnosis not present

## 2023-09-16 DIAGNOSIS — I447 Left bundle-branch block, unspecified: Secondary | ICD-10-CM | POA: Diagnosis not present

## 2023-09-16 DIAGNOSIS — R5383 Other fatigue: Secondary | ICD-10-CM | POA: Diagnosis not present

## 2023-09-16 DIAGNOSIS — Z9013 Acquired absence of bilateral breasts and nipples: Secondary | ICD-10-CM | POA: Insufficient documentation

## 2023-09-16 DIAGNOSIS — Z171 Estrogen receptor negative status [ER-]: Secondary | ICD-10-CM

## 2023-09-16 DIAGNOSIS — Z95810 Presence of automatic (implantable) cardiac defibrillator: Secondary | ICD-10-CM

## 2023-09-16 DIAGNOSIS — C50412 Malignant neoplasm of upper-outer quadrant of left female breast: Secondary | ICD-10-CM

## 2023-09-16 NOTE — Patient Instructions (Addendum)
Good to see you today!  Please follow-up with Dr Rodman Key tomorrow-Tue 09/17/23 at 11:15 AM  Your physician recommends that you schedule a follow-up appointment in: 4 months with an echocardiogram  Do the following things EVERYDAY: Weigh yourself in the morning before breakfast. Write it down and keep it in a log. Take your medicines as prescribed Eat low salt foods--Limit salt (sodium) to 2000 mg per day.  Stay as active as you can everyday Limit all fluids for the day to less than 2 liters  If you have any questions or concerns before your next appointment please send [place] a message through [place] or call our office at [phone].    TO LEAVE A MESSAGE FOR THE NURSE SELECT OPTION 2, PLEASE LEAVE A MESSAGE INCLUDING: YOUR NAME DATE OF BIRTH CALL BACK NUMBER REASON FOR CALL**this is important as we prioritize the call backs  YOU WILL RECEIVE A CALL BACK THE SAME DAY AS LONG AS YOU CALL BEFORE 4:00 PM  At the [facility], you and your health needs are our priority. As part of our continuing mission to provide you with exceptional heart care, we have created designated Provider Care Teams. These Care Teams include your primary Cardiologist (physician) and Advanced Practice Providers (APPs- Physician Assistants and Nurse Practitioners) who all work together to provide you with the care you need, when you need it.   You may see any of the following providers on your designated Care Team at your next follow up: Dr Arvilla Meres Dr Marca Ancona Dr. Dorthula Nettles Dr. Clearnce Hasten Amy Filbert Schilder, NP Robbie Lis, [place] [facility] [place], [place] Brynda Peon, NP [place] Lee, NP Karle Plumber, PharmD   Please be sure to bring in all your medications bottles to every appointment.    Thank you for choosing [facility]

## 2023-09-16 NOTE — Addendum Note (Signed)
Encounter addended by: Noralee Space, RN on: 09/16/2023 9:56 AM  Actions taken: Clinical Note Signed

## 2023-09-16 NOTE — Addendum Note (Signed)
Encounter addended by: Suezanne Cheshire, RN on: 09/16/2023 9:41 AM  Actions taken: Visit diagnoses modified, Order list changed, Diagnosis association updated, Clinical Note Signed

## 2023-09-17 ENCOUNTER — Encounter: Payer: Self-pay | Admitting: Internal Medicine

## 2023-09-17 ENCOUNTER — Encounter: Payer: Self-pay | Admitting: Cardiology

## 2023-09-17 ENCOUNTER — Ambulatory Visit: Payer: 59 | Admitting: Internal Medicine

## 2023-09-17 ENCOUNTER — Ambulatory Visit: Payer: 59 | Attending: Cardiology | Admitting: Cardiology

## 2023-09-17 VITALS — BP 108/72 | HR 83 | Resp 16 | Ht 67.0 in | Wt 184.0 lb

## 2023-09-17 VITALS — BP 108/72 | HR 83 | Ht 67.0 in | Wt 184.0 lb

## 2023-09-17 DIAGNOSIS — I5022 Chronic systolic (congestive) heart failure: Secondary | ICD-10-CM | POA: Diagnosis not present

## 2023-09-17 DIAGNOSIS — E119 Type 2 diabetes mellitus without complications: Secondary | ICD-10-CM | POA: Diagnosis not present

## 2023-09-17 DIAGNOSIS — Z95810 Presence of automatic (implantable) cardiac defibrillator: Secondary | ICD-10-CM

## 2023-09-17 DIAGNOSIS — C50412 Malignant neoplasm of upper-outer quadrant of left female breast: Secondary | ICD-10-CM

## 2023-09-17 DIAGNOSIS — I10 Essential (primary) hypertension: Secondary | ICD-10-CM

## 2023-09-17 DIAGNOSIS — I428 Other cardiomyopathies: Secondary | ICD-10-CM

## 2023-09-17 DIAGNOSIS — E782 Mixed hyperlipidemia: Secondary | ICD-10-CM

## 2023-09-17 DIAGNOSIS — Z171 Estrogen receptor negative status [ER-]: Secondary | ICD-10-CM

## 2023-09-17 DIAGNOSIS — I447 Left bundle-branch block, unspecified: Secondary | ICD-10-CM | POA: Diagnosis not present

## 2023-09-17 NOTE — Patient Instructions (Addendum)
Medication Instructions:  Your physician recommends that you continue on your current medications as directed. Please refer to the Current Medication list given to you today.  *If you need a refill on your cardiac medications before your next appointment, please call your pharmacy*  Lab Work: CBC CMET -- within 30 days of procedure   If you have labs (blood work) drawn today and your tests are completely normal, you will receive your results only by: MyChart Message (if you have MyChart) OR A paper copy in the mail If you have any lab test that is abnormal or we need to change your treatment, we will call you to review the results.  Testing/Procedures: ICD extraction 11/07/23 Instructions to come.  Follow-Up: At [facility], you and your health needs are our priority.  As part of our continuing mission to provide you with exceptional heart care, we have created designated Provider Care Teams.  These Care Teams include your primary Cardiologist (physician) and Advanced Practice Providers (APPs -  Physician Assistants and Nurse Practitioners) who all work together to provide you with the care you need, when you need it.  Your next appointment:   To be scheduled  The format for your next appointment:   In Person  Provider:   Lewayne Bunting, MD{or one of the following Advanced Practice Providers on your designated Care Team:   Francis Dowse, [place] Casimiro Needle "Mardelle Matte" [place], [place] [name], NP  Remote monitoring is used to monitor your Pacemaker/ ICD from home. This monitoring reduces the number of office visits required to check your device to one time per year. It allows [place] to keep an eye on the functioning of your device to ensure it is working properly.   Important Information About [place]

## 2023-09-17 NOTE — Progress Notes (Signed)
HPI Jordan Willis returns today for followup. She is a pleasant 72 yo woman with bilateral breast CA, non-ischemic CM, chronic systolic heart failure, class 2, and LBBB. She underwent biv ICD insertion from the right side (she had previously just had left breast surgery). Her CS was quite toruous and her veins diminutive and we opted to placed a left bundle area lead. Her initial ECG post op was narrow but subsequent showed septal capture and pacing induced LBBB. We turned off her pacing. In the interim, she has begun to exercise by going to a pool and doing water aerobics. She can walk but not too fast. Her daughter notes that she got sob when walking into the office today. She had to stop and rest. Allergies  Allergen Reactions   Bactrim [Sulfamethoxazole-Trimethoprim] Shortness Of Breath    Shortness of breath, difficulty breathing, headache, rash, fatigue   Aspirin Palpitations and Other (See Comments)    Heart fluttering, pt takes 40.5 mg (half of 81 mg) daily Other reaction(s): bradycardia, Not available   Sulfa Antibiotics Hives   Tape Rash    Tears skin off     Current Outpatient Medications  Medication Sig Dispense Refill   albuterol (VENTOLIN HFA) 108 (90 Base) MCG/ACT inhaler Inhale 2 puffs into the lungs every 6 (six) hours as needed for wheezing or shortness of breath.     b complex vitamins capsule Take 1 capsule by mouth daily.     baclofen (LIORESAL) 10 MG tablet Take 10 mg by mouth as needed for muscle spasms.     bumetanide (BUMEX) 1 MG tablet TAKE 1 TABLET BY MOUTH EVERY DAY 90 tablet 1   cetirizine (ZYRTEC) 10 MG tablet Take 10 mg by mouth in the morning.     Cholecalciferol (VITAMIN D3) 25 MCG (1000 UT) CAPS Take 1,000 Units by mouth in the morning.     diclofenac Sodium (VOLTAREN) 1 % GEL Apply 1 Application topically 4 (four) times daily as needed (pain).     exemestane (AROMASIN) 25 MG tablet TAKE 1 TABLET (25 MG TOTAL) BY MOUTH DAILY AFTER BREAKFAST. 90 tablet 3    FARXIGA 10 MG TABS tablet TAKE 1 TABLET BY MOUTH EVERY DAY BEFORE BREAKFAST 90 tablet 0   fluticasone (FLONASE) 50 MCG/ACT nasal spray Place 1 spray into both nostrils in the morning.     GLUCOSAMINE-CHONDROITIN PO Take 1 tablet by mouth every evening.      glucose blood (ONETOUCH VERIO) test strip TEST ONCE DAILY AS DIRECTED 90     ibuprofen (ADVIL) 200 MG tablet Take 400 mg by mouth every 6 (six) hours as needed for mild pain or moderate pain.     metFORMIN (GLUCOPHAGE-XR) 500 MG 24 hr tablet Take 500 mg by mouth 2 (two) times daily.     metoprolol succinate (TOPROL-XL) 25 MG 24 hr tablet TAKE 1 TABLET (25 MG TOTAL) BY MOUTH IN THE MORNING. HOLD IF SYSTOLIC BLOOD PRESSURE (TOP BLOOD PRESSURE NUMBER) LESS THAN 100 MMHG OR HEART RATE LESS THAN 60 BPM (PULSE). 90 tablet 3   Multiple Vitamin (MULTIVITAMIN WITH MINERALS) TABS tablet Take 1 tablet by mouth daily. Centrum Silver for Women 50+     nitroGLYCERIN (NITROSTAT) 0.4 MG SL tablet Place 0.4 mg under the tongue every 5 (five) minutes as needed for chest pain.     Olopatadine HCl 0.2 % SOLN Place 1 drop into both eyes daily. Pataday     ondansetron (ZOFRAN) 4 MG tablet Take 1  tablet (4 mg total) by mouth every 6 (six) hours as needed for nausea or vomiting. 20 tablet 0   OneTouch Delica Lancets 33G MISC Apply topically.     Probiotic Product (PROBIOTIC DAILY PO) Take 1 capsule by mouth in the morning.     Propylene Glycol (SYSTANE BALANCE OP) Apply 1 drop to eye 2 (two) times daily.     rosuvastatin (CRESTOR) 20 MG tablet Take 20 mg by mouth at bedtime.     sacubitril-valsartan (ENTRESTO) 24-26 MG Take 1 tablet by mouth 2 (two) times daily. 60 tablet 6   sertraline (ZOLOFT) 50 MG tablet Take 50 mg by mouth in the morning.     spironolactone (ALDACTONE) 25 MG tablet TAKE 1 TABLET BY MOUTH EVERY DAY IN THE MORNING 90 tablet 0   No current facility-administered medications for this visit.     Past Medical History:  Diagnosis Date   AICD  (automatic cardioverter/defibrillator) present    Anemia    Anginal pain (HCC)    Anxiety    Arthritis    Lumbar spine DDD.   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   Diabetes mellitus without complication (HCC)    Type II   Dyspnea    When patient gets sick uses Pro-Air inhaler   Frequent sinus infections    GERD (gastroesophageal reflux disease)    occ   Goiter    History of cardiomegaly    History of cataract    Bilateral   History of kidney stones    08/28/2018 currently has a large kidney stone, asymptomatic at this time   History of migraine    History of uterine fibroid    History of uterine prolapse    Hyperlipidemia    Hypertension    LBBB (left bundle branch block)    Menopause 03/03/2012   Nasal turbinate hypertrophy 11/29/2016   Left inferior    Personal history of chemotherapy    Personal history of radiation therapy    Pneumonia    as a child   PONV (postoperative nausea and vomiting)    Sinusitis 2014   Sleep apnea    No CPAP   SUI (stress urinary incontinence, female)    SVD (spontaneous vaginal delivery)    x 2   Thyroid nodule     ROS:   All systems reviewed and negative except as noted in the HPI.   Past Surgical History:  Procedure Laterality Date   ABDOMINAL HYSTERECTOMY     BILATERAL SALPINGOOPHORECTOMY Bilateral    BIV ICD INSERTION CRT-D N/A 08/24/2022   Procedure: BIV ICD INSERTION CRT-D;  Surgeon: Marinus Maw, MD;  Location: [facility] INVASIVE CV LAB;  Service: Cardiovascular;  Laterality: N/A;   BLADDER SUSPENSION N/A 05/16/2017   Procedure: TRANSVAGINAL TAPE (TVT) PROCEDURE;  Surgeon: Osborn Coho, MD;  Location: [facility] ORS;  Service: Gynecology;  Laterality: N/A;   BREAST BIOPSY Right 2009   BREAST IMPLANT REMOVAL Bilateral 02/05/2022   Procedure: Removal  bilateral breast implants;  Surgeon: Allena Napoleon, MD;  Location: [facility] OR;  Service: Plastics;  Laterality: Bilateral;   BREAST LUMPECTOMY Right 2009   BREAST LUMPECTOMY Left 2006   BREAST RECONSTRUCTION WITH PLACEMENT OF TISSUE EXPANDER AND FLEX HD (ACELLULAR HYDRATED DERMIS) Bilateral 09/04/2021   Procedure: BILATERAL BREAST RECONSTRUCTION  WITH PLACEMENT OF TISSUE EXPANDER AND FLEX HD (ACELLULAR HYDRATED DERMIS);  Surgeon: Allena Napoleon, MD;  Location: [facility] OR;  Service: Plastics;  Laterality: Bilateral;   CARDIAC CATHETERIZATION     CATARACT EXTRACTION Left 10/23/2017   CATARACT EXTRACTION Right 11/06/2017   COLONOSCOPY  10/22/2009   Normal.  Repeat 5 years.  [name]   COLONOSCOPY WITH PROPOFOL N/A 12/21/2022   Procedure: COLONOSCOPY WITH PROPOFOL;  Surgeon: Jeani Hawking, MD;  Location: [facility];  Service: Gastroenterology;  Laterality: N/A;   CYSTOSCOPY N/A 05/16/2017   Procedure: CYSTOSCOPY;  Surgeon: Osborn Coho, MD;  Location: [facility] ORS;  Service: Gynecology;  Laterality: N/A;   DEBRIDEMENT AND CLOSURE WOUND Bilateral 10/03/2021   Procedure: Debridement bilateral mastectomy flaps;  Surgeon: Allena Napoleon, MD;  Location: [facility] OR;  Service: Plastics;  Laterality: Bilateral;  1.5 hour   EYE SURGERY     bilateral cataracts   LEFT HEART CATH AND CORONARY ANGIOGRAPHY N/A 02/09/2020   Procedure: LEFT HEART CATH AND CORONARY ANGIOGRAPHY;  Surgeon: Elder Negus, MD;  Location: [facility] INVASIVE CV LAB;  Service: Cardiovascular;  Laterality: N/A;   LEFT HEART CATH AND CORONARY ANGIOGRAPHY N/A 03/27/2022   Procedure: LEFT HEART CATH AND CORONARY ANGIOGRAPHY;  Surgeon: Yates Decamp, MD;  Location: [facility] INVASIVE CV LAB;  Service: Cardiovascular;  Laterality: N/A;   MASS EXCISION Right 12/04/2022   Procedure: EXCISION OF RIGHT CHEST WALL MASS;  Surgeon: Almond Lint, MD;  Location: [facility] OR;  Service: General;  Laterality: Right;   MASTECTOMY W/ SENTINEL NODE BIOPSY Right 09/04/2021   Procedure:  BILATERAL MASTECTOMIES WITH RIGHT SENTINEL LYMPH NODE BIOPSY;  Surgeon: Almond Lint, MD;  Location: [facility] OR;  Service: General;  Laterality: Right;   REMOVAL OF BILATERAL TISSUE EXPANDERS WITH PLACEMENT OF BILATERAL BREAST IMPLANTS Bilateral 10/03/2021   Procedure: REMOVAL OF BILATERAL TISSUE EXPANDERS WITH REPLACEMENT OF TISSUE EXPANDERS;  Surgeon: Allena Napoleon, MD;  Location: [facility] OR;  Service: Plastics;  Laterality: Bilateral;   REMOVAL OF BILATERAL TISSUE EXPANDERS WITH PLACEMENT OF BILATERAL BREAST IMPLANTS Bilateral 12/19/2021   Procedure: REMOVAL OF BILATERAL TISSUE EXPANDERS WITH PLACEMENT OF BILATERAL BREAST IMPLANTS;  Surgeon: Allena Napoleon, MD;  Location: [facility] OR;  Service: Plastics;  Laterality: Bilateral;   ROBOTIC ASSISTED TOTAL HYSTERECTOMY N/A 05/16/2017   Procedure: ROBOTIC ASSISTED TOTAL HYSTERECTOMY;  Surgeon: Silverio Lay, MD;  Location: [facility] OR[facility];  Service: Gynecology;  Laterality: N/A;   ROTATOR CUFF REPAIR Bilateral    SCAR REVISION N/A 12/04/2022   Procedure: REVISION OF MASTECTOMY SCAR;  Surgeon: Almond Lint, MD;  Location: [facility] OR;  Service: General;  Laterality: N/A;   SHOULDER ARTHROSCOPY WITH ROTATOR CUFF REPAIR AND SUBACROMIAL DECOMPRESSION Right 09/03/2018   Procedure: Right shoulder manipulation under anesthesia, exam under anesthesia, mini open rotator cuff repair, subacromial decompression;  Surgeon: Jene Every, MD;  Location: [facility];  Service: Orthopedics;  Laterality: Right;  90 mins   TUBAL LIGATION     WISDOM TOOTH EXTRACTION       Family History  Problem Relation Age of Onset   Cancer Mother 81       breast cancer   Hypertension Mother    Diabetes Father    Cancer Sister        breast cancer   Breast cancer Sister    Cancer Sister        breast cancer   Breast cancer Sister      Social History   Socioeconomic History   Marital status: Divorced  Spouse name: n/a   Number of children: 2   Years of education: 36   Highest education level:  Not on file  Occupational History   Occupation: Retired    Associate Professor: [employer]    Comment: Diplomatic Services operational officer  Tobacco Use   Smoking status: Never   Smokeless tobacco: Never  Vaping Use   Vaping status: Never Used  Substance and Sexual Activity   Alcohol use: No   Drug use: No   Sexual activity: Not Currently    Partners: Male    Birth control/protection: Surgical, Post-menopausal    Comment: Hysterectomy  Other Topics Concern   Not on file  Social History Narrative   Marital status: divorced; dating seriously x many years.       Children:  2 children; 1 grandchild.      Employment: Research officer, political party.      Lives: alone      Tobacco: none       Alcohol: none      Exercise:  Walking with sister two days per week.      Seatbelt: 100%      Guns: none      Right-handed   Caffeine: occasional coffee, hot tea or green tea a few times per week   Social Determinants of Health   Financial Resource Strain: Not on file  Food Insecurity: Not on file  Transportation Needs: Not on file  Physical Activity: Not on file  Stress: Not on file  Social Connections: Not on file  Intimate Partner Violence: Not on file     BP 108/72   Pulse 83   Ht 5\' 7"  (1.702 m)   Wt 184 lb (83.5 kg)   SpO2 97%   BMI 28.82 kg/m   Physical Exam:  Well appearing NAD HEENT: Unremarkable Neck:  No JVD, no thyromegally Lymphatics:  No adenopathy Back:  No CVA tenderness Lungs:  Clear with no wheezes HEART:  Regular rate rhythm, no murmurs, no rubs, no clicks Abd:  soft, positive bowel sounds, no organomegally, no rebound, no guarding Ext:  2 plus pulses, no edema, no cyanosis, no clubbing Skin:  No rashes no nodules Neuro:  CN II through XII intact, motor grossly intact  DEVICE  Normal device function.  See PaceArt for details.   Assess/Plan:  Chronic systolic heart failure - her symptoms are class 3. She will continue her current meds and I have encouraged regular exercise. ICD - her  device is programmed VVI 60 and she is sensing appropriately and not pacing.  Breast CA - she is s/p bilateral mastectomy. She appears to have finally healed her incisions.  Disposition - I have discussed the treatment options with the patient and her daughter. Her CHF has worsened. She has class 3 symptoms. She has LBBB. She is on maximal GDMT. I have discussed device revision. Based on her anatomy, it would be impossible to get a system with resynchronization pacing from the right side. Too much tortuousity. I have offered her removal of her right sided system. Placement of a new system on the left side now that she has healed. I think a left bundle lead is possible and even a CS lead. I have reviewed the risks/benefits/including the risk of infection and she will discuss with her daughter and let us know if she would like to proceed.   Sharlot Gowda Lakima Dona,MD

## 2023-09-17 NOTE — Progress Notes (Signed)
Cardiology Office Note:  .   Date:  09/17/2023  ID:  [name], DOB [birth year], MRN [id number] PCP:  Merri Brunette, MD  Former Cardiology Providers: Toniann Fail, MSN, APRN, FNP-C  [facility] Providers Cardiologist:  Tessa Lerner, DO Electrophysiologist:  Lewayne Bunting, MD  Advanced Heart Failure:  Arvilla Meres, MD , [facility] (established care 12/2019) Electrophysiologist:  Lewayne Bunting, MD  Click to update primary MD,subspecialty MD or APP then REFRESH:1}    Chief Complaint  Patient presents with   NICM (nonischemic cardiomyopathy)    Follow-up    History of Present Illness: .   Jordan Willis is a 72 y.o. African-American female whose past medical history and cardiovascular risk factors includes: Left bundle branch block, type 2 diabetes mellitus, hyperlipidemia, nonischemic cardiomyopathy, chronic combined systolic/diastolic heart failure, recurrent breast cancer status post bilateral mastectomies & herceptin, postmenopausal female, advanced age.   Patient has known history of nonischemic cardiomyopathy.  Her GDMT has been uptitrated to the maximally tolerated doses.  She is also been evaluated by EP and underwent BiV ICD implant in November 2023.  Her coronary sinus did not have any acceptable vein for BiV pacing and her left bundle area pacing was initially good but currently was only capturing the septum and not effectively providing BiV pacing.  She is being considered for possible lead.   Due to recurrent breast cancer and her underlying cardiomyopathy the she was referred to advanced heart failure for further direction and management.  After shared decision between the patient /family, Dr. Pamelia Hoit, Dr. Gala Romney, and myself patient did undergo another round of Herceptin.  Patient no longer on Herceptin due to normal LVEF.  Her device interrogation noted 1 episode of AT/AF on 10/ 10.  Based on Dr. Prescott Gum note from November 2024 shared decision was to hold off on  anticoagulation and to continue to monitor her A-fib burden.  Clinically that she denies anginal chest pain or heart failure symptoms.  But in general feels tired and fatigued.  She has an appointment with Dr. Ladona Ridgel later this morning to discuss BiV pacing.  Review of Systems: .   Review of Systems  Constitutional: Positive for malaise/fatigue.  Cardiovascular:  Negative for chest pain, claudication, irregular heartbeat, leg swelling, near-syncope, orthopnea, palpitations, paroxysmal nocturnal dyspnea and syncope.  Respiratory:  Negative for shortness of breath.   Hematologic/Lymphatic: Negative for bleeding problem.    Studies Reviewed:   EKG: 03/15/2023: Sinus rhythm, 78 bpm, LAE, LBBB, ST-T changes inferior and lateral leads likely secondary to LBBB underlying ischemia cannot be without.   Echocardiogram: 03/29/2021:  50-55%, G1DD, moderate MR, Moderate TR, PASP .    06/15/2021: LVEF 30-35%, severe hypokinesis of the anteroseptal and apex, moderate to severe LV dysfunction, grade 1 diastolic dysfunction, elevated LAP, global longitudinal strain -16.7%, mild to moderate Mr.    10/09/2021: 45-50%, no regional wall motion abnormalities, global longitudinal strain -13.2%, mild MR.    01/29/2022: 35-40%, average GLS -10.3%, grade 1 diastolic impairment, severe hypokinesis of the left ventricle with regional wall motion abnormalities, moderately dilated left atrium, posterior pericardial effusion without tamponade, moderate to severe MR, estimated RAP 8 mmHg.   10/29/2022 EF 30-35% RV low normal  September 2024: LVEF 20-25%, septal motion consistent with LBBB, right ventricular systolic function normal & size normal estimated RAP 3 mmHg, mild to moderate MR   Stress Testing: 02/14/2018 exercise nuclear stress test at [facility]: Achieved 7 METS, normal blood pressure response to exercise, had exertional  chest pain that improved/resolved during recovery, normal myocardial  perfusion study no EKG abnormalities per report.   Heart Catheterization: Left Heart Catheterization 03/27/22: No CAD EF 30-35%   MUGA scan 05/10/2022: 1. Left ventricular ejection fraction is equal to 35.3%. 2. Moderate to marked septal hypokinesis. 3. Left ventricular dilatation.  RADIOLOGY: NA  Risk Assessment/Calculations:   NA  Labs:       Latest Ref Rng & Units 06/17/2023    8:02 AM 11/26/2022    8:21 AM 10/25/2022    1:10 PM  CBC  WBC 4.0 - 10.5 K/uL 5.1  4.7  5.1   Hemoglobin 12.0 - 15.0 g/dL 96.2  95.2  84.1   Hematocrit 36.0 - 46.0 % 37.4  39.4  38.0   Platelets 150 - 400 K/uL 275  254  305        Latest Ref Rng & Units 06/17/2023    8:02 AM 11/26/2022    8:21 AM 09/19/2022    8:33 AM  BMP  Glucose 70 - 99 mg/dL 324  401  027   BUN 8 - 23 mg/dL 14  8  13    Creatinine 0.44 - 1.00 mg/dL 2.53  6.64  4.03   BUN/Creat Ratio 12 - 28   19   Sodium 135 - 145 mmol/L 139  137  135   Potassium 3.5 - 5.1 mmol/L 3.4  3.7  4.1   Chloride 98 - 111 mmol/L 103  102  98   CO2 22 - 32 mmol/L 28  23  24    Calcium 8.9 - 10.3 mg/dL 9.7  9.7  47.4       Latest Ref Rng & Units 06/17/2023    8:02 AM 11/26/2022    8:21 AM 09/19/2022    8:33 AM  CMP  Glucose 70 - 99 mg/dL 259  563  875   BUN 8 - 23 mg/dL 14  8  13    Creatinine 0.44 - 1.00 mg/dL 6.43  3.29  5.18   Sodium 135 - 145 mmol/L 139  137  135   Potassium 3.5 - 5.1 mmol/L 3.4  3.7  4.1   Chloride 98 - 111 mmol/L 103  102  98   CO2 22 - 32 mmol/L 28  23  24    Calcium 8.9 - 10.3 mg/dL 9.7  9.7  84.1   Total Protein 6.5 - 8.1 g/dL 7.8     Total Bilirubin 0.3 - 1.2 mg/dL 0.5     Alkaline Phos 38 - 126 U/L 129     AST 15 - 41 U/L 21     ALT 0 - 44 U/L 25       Lab Results  Component Value Date   CHOL 158 10/13/2020   HDL 44 10/13/2020   LDLCALC 94 10/13/2020   TRIG 107 10/13/2020   CHOLHDL 6.2 10/11/2013   No results for input(s): "LIPOA" in the last 8760 hours. No components found for: "NTPROBNP" No results for  input(s): "PROBNP" in the last 8760 hours. No results for input(s): "TSH" in the last 8760 hours.  Physical Exam:    Today's Vitals   09/17/23 0901  BP: 108/72  Pulse: 83  Resp: 16  SpO2: 97%  Weight: 184 lb (83.5 kg)  Height: 5\' 7"  (1.702 m)   Body mass index is 28.82 kg/m. Wt Readings from Last 3 Encounters:  09/17/23 184 lb (83.5 kg)  09/17/23 184 lb (83.5 kg)  09/16/23 183 lb 9.6 oz (83.3 kg)  Physical Exam  Constitutional: No distress.  Age appropriate, hemodynamically stable.   Neck: No JVD present.  Cardiovascular: Normal rate, regular rhythm, S1 normal, S2 normal, intact distal pulses and normal pulses. Exam reveals no gallop, no S3 and no S4.  No murmur heard. Pulmonary/Chest: Effort normal and breath sounds normal. No stridor. She has no wheezes. She has no rales.  Bilateral mastectomies.    Abdominal: Soft. Bowel sounds are normal. She exhibits no distension. There is no abdominal tenderness.  Musculoskeletal:        General: No edema.     Cervical back: Neck supple.  Neurological: She is alert and oriented to person, place, and time. She has intact cranial nerves (2-12).  Skin: Skin is warm and moist.     Impression & Recommendation(s):  Impression:   ICD-10-CM   1. Chronic systolic heart failure (HCC)  Z30.86     2. NICM (nonischemic cardiomyopathy) (HCC)  I42.8     3. LBBB (left bundle branch block)  I44.7     4. ICD (implantable cardioverter-defibrillator) in place  Z95.810     5. Malignant neoplasm of upper-outer quadrant of left breast in female, estrogen receptor negative (HCC)  C50.412    Z17.1     6. Essential hypertension  I10     7. Non-insulin dependent type 2 diabetes mellitus (HCC)  E11.9     8. Mixed hyperlipidemia  E78.2        Recommendation(s):  Chronic systolic heart failure (HCC) NICM (nonischemic cardiomyopathy) (HCC) LBBB (left bundle branch block) ICD (implantable cardioverter-defibrillator) in place Stage B, NYHA  class III. Echocardiogram September 2024: LVEF 20-25%, see report for details Reduction in LVEF likely secondary to LBBB cardiomyopathy but Herceptin mediated toxicity cannot be entirely ruled out.  Currently being followed by Dr. Gala Romney for advanced heart failure.  Progress note from 09/16/2023 reviewed. Patient has an appointment with Dr. Ladona Ridgel later today to discuss BiV pacing Currently on maximally tolerated doses of medications. Euvolemic on physical examination. Continue Entresto 24/26 mg p.o. twice daily. Continue spironolactone 25 mg p.o. daily. Continue Toprol-XL25 mg p.o. daily. Continue Farxiga 10 mg p.o. daily. Continue Bumex1 mg p.o. daily  Malignant neoplasm of upper-outer quadrant of left breast in female, estrogen receptor negative (HCC) Follows with Dr. Pamelia Hoit. Given the reduction in LVEF holding Herceptin for now.  Essential hypertension Office blood pressures are soft. Overall asymptomatic but does complain of generalized tired and fatigue.  If symptoms continue we will consider transitioning from Entresto to losartan.  Continue current medical therapy  Currently patient is seen by multiple facets of cardiology given the complexity.  In the short-term we need to discuss the feasibility of BiV pacing and to reevaluate LVEF thereafter.  To reduce follow up burden I have asked the patient and family to follow up w/ EP and Advance HF for now. General Cardiology to follow peripherally but she is more than welcome to see me sooner than 6 months if needed.   Orders Placed:  No orders of the defined types were placed in this encounter.   Final Medication List:   No orders of the defined types were placed in this encounter.   There are no discontinued medications.   Current Outpatient Medications:    albuterol (VENTOLIN HFA) 108 (90 Base) MCG/ACT inhaler, Inhale 2 puffs into the lungs every 6 (six) hours as needed for wheezing or shortness of breath., Disp: , Rfl:    b  complex vitamins capsule, Take 1 capsule by  mouth daily., Disp: , Rfl:    baclofen (LIORESAL) 10 MG tablet, Take 10 mg by mouth as needed for muscle spasms., Disp: , Rfl:    bumetanide (BUMEX) 1 MG tablet, TAKE 1 TABLET BY MOUTH EVERY DAY, Disp: 90 tablet, Rfl: 1   cetirizine (ZYRTEC) 10 MG tablet, Take 10 mg by mouth in the morning., Disp: , Rfl:    Cholecalciferol (VITAMIN D3) 25 MCG (1000 UT) CAPS, Take 1,000 Units by mouth in the morning., Disp: , Rfl:    diclofenac Sodium (VOLTAREN) 1 % GEL, Apply 1 Application topically 4 (four) times daily as needed (pain)., Disp: , Rfl:    exemestane (AROMASIN) 25 MG tablet, TAKE 1 TABLET (25 MG TOTAL) BY MOUTH DAILY AFTER BREAKFAST., Disp: 90 tablet, Rfl: 3   FARXIGA 10 MG TABS tablet, TAKE 1 TABLET BY MOUTH EVERY DAY BEFORE BREAKFAST, Disp: 90 tablet, Rfl: 0   fluticasone (FLONASE) 50 MCG/ACT nasal spray, Place 1 spray into both nostrils in the morning., Disp: , Rfl:    GLUCOSAMINE-CHONDROITIN PO, Take 1 tablet by mouth every evening. , Disp: , Rfl:    glucose blood (ONETOUCH VERIO) test strip, TEST ONCE DAILY AS DIRECTED 90, Disp: , Rfl:    ibuprofen (ADVIL) 200 MG tablet, Take 400 mg by mouth every 6 (six) hours as needed for mild pain or moderate pain., Disp: , Rfl:    metFORMIN (GLUCOPHAGE-XR) 500 MG 24 hr tablet, Take 500 mg by mouth 2 (two) times daily., Disp: , Rfl:    metoprolol succinate (TOPROL-XL) 25 MG 24 hr tablet, TAKE 1 TABLET (25 MG TOTAL) BY MOUTH IN THE MORNING. HOLD IF SYSTOLIC BLOOD PRESSURE (TOP BLOOD PRESSURE NUMBER) LESS THAN 100 MMHG OR HEART RATE LESS THAN 60 BPM (PULSE)., Disp: 90 tablet, Rfl: 3   Multiple Vitamin (MULTIVITAMIN WITH MINERALS) TABS tablet, Take 1 tablet by mouth daily. Centrum Silver for Women 50+, Disp: , Rfl:    nitroGLYCERIN (NITROSTAT) 0.4 MG SL tablet, Place 0.4 mg under the tongue every 5 (five) minutes as needed for chest pain., Disp: , Rfl:    Olopatadine HCl 0.2 % SOLN, Place 1 drop into both eyes daily.  Pataday, Disp: , Rfl:    ondansetron (ZOFRAN) 4 MG tablet, Take 1 tablet (4 mg total) by mouth every 6 (six) hours as needed for nausea or vomiting., Disp: 20 tablet, Rfl: 0   OneTouch Delica Lancets 33G MISC, Apply topically., Disp: , Rfl:    Probiotic Product (PROBIOTIC DAILY PO), Take 1 capsule by mouth in the morning., Disp: , Rfl:    Propylene Glycol (SYSTANE BALANCE OP), Apply 1 drop to eye 2 (two) times daily., Disp: , Rfl:    rosuvastatin (CRESTOR) 20 MG tablet, Take 20 mg by mouth at bedtime., Disp: , Rfl:    sacubitril-valsartan (ENTRESTO) 24-26 MG, Take 1 tablet by mouth 2 (two) times daily., Disp: 60 tablet, Rfl: 6   sertraline (ZOLOFT) 50 MG tablet, Take 50 mg by mouth in the morning., Disp: , Rfl:    spironolactone (ALDACTONE) 25 MG tablet, TAKE 1 TABLET BY MOUTH EVERY DAY IN THE MORNING, Disp: 90 tablet, Rfl: 0  Consent:   None  Disposition:   6 months sooner if needed.   Her questions and concerns were addressed to her satisfaction. She voices understanding of the recommendations provided during this encounter.    Signed, Tessa Lerner, DO, [facility]  [facility]  [address] [place], [place] [address] 09/17/2023 6:48 PM

## 2023-09-17 NOTE — Patient Instructions (Signed)
Medication Instructions:  Your physician recommends that you continue on your current medications as directed. Please refer to the Current Medication list given to you today.  *If you need a refill on your cardiac medications before your next appointment, please call your pharmacy*  Lab Work: None ordered today. If you have labs (blood work) drawn today and your tests are completely normal, you will receive your results only by: MyChart Message (if you have MyChart) OR A paper copy in the mail If you have any lab test that is abnormal or we need to change your treatment, we will call you to review the results.  Testing/Procedures: None ordered today.  Follow-Up: At [facility], you and your health needs are our priority.  As part of our continuing mission to provide you with exceptional heart care, we have created designated Provider Care Teams.  These Care Teams include your primary Cardiologist (physician) and Advanced Practice Providers (APPs -  Physician Assistants and Nurse Practitioners) who all work together to provide you with the care you need, when you need it.  We recommend signing up for the patient portal called "MyChart".  Sign up information is provided on this After Visit Summary.  MyChart is used to connect with patients for Virtual Visits (Telemedicine).  Patients are able to view lab/test results, encounter notes, upcoming appointments, etc.  Non-urgent messages can be sent to your provider as well.   To learn more about what you can do with MyChart, go to [email or web address].    Your next appointment:   6 month(s)  The format for your next appointment:   In Person  Provider:   Tessa Lerner, DO {

## 2023-09-18 ENCOUNTER — Other Ambulatory Visit (HOSPITAL_COMMUNITY): Payer: 59

## 2023-09-23 NOTE — Assessment & Plan Note (Signed)
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  Cycle 7 Herceptin (because of cardiomyopathy, Dr. Gala Romney is watching very closely)   Signatera: 01/02/2023: Positive 0.07 MTM/ml Signatera 04/17/2023: Negative   Bone scan 08/06/2023: Increased uptake within the sternum and manubrium similar to before.     Headaches and diarrhea: Was probably related to metformin.  She reduced the dose of metformin and the diarrhea has improved. Continue with Herceptin infusions every 3 weeks and I will see the patient every 6 weeks.

## 2023-09-24 ENCOUNTER — Inpatient Hospital Stay: Payer: 59 | Attending: Hematology and Oncology

## 2023-09-24 ENCOUNTER — Inpatient Hospital Stay (HOSPITAL_BASED_OUTPATIENT_CLINIC_OR_DEPARTMENT_OTHER): Payer: 59 | Admitting: Hematology and Oncology

## 2023-09-24 VITALS — BP 125/54 | HR 88 | Temp 97.1°F | Resp 18 | Ht 67.0 in | Wt 188.4 lb

## 2023-09-24 DIAGNOSIS — Z923 Personal history of irradiation: Secondary | ICD-10-CM | POA: Insufficient documentation

## 2023-09-24 DIAGNOSIS — Z79811 Long term (current) use of aromatase inhibitors: Secondary | ICD-10-CM | POA: Insufficient documentation

## 2023-09-24 DIAGNOSIS — Z171 Estrogen receptor negative status [ER-]: Secondary | ICD-10-CM | POA: Diagnosis not present

## 2023-09-24 DIAGNOSIS — Z803 Family history of malignant neoplasm of breast: Secondary | ICD-10-CM | POA: Insufficient documentation

## 2023-09-24 DIAGNOSIS — Z1509 Genetic susceptibility to other malignant neoplasm: Secondary | ICD-10-CM | POA: Insufficient documentation

## 2023-09-24 DIAGNOSIS — C50411 Malignant neoplasm of upper-outer quadrant of right female breast: Secondary | ICD-10-CM | POA: Diagnosis not present

## 2023-09-24 DIAGNOSIS — C50412 Malignant neoplasm of upper-outer quadrant of left female breast: Secondary | ICD-10-CM

## 2023-09-24 DIAGNOSIS — Z9013 Acquired absence of bilateral breasts and nipples: Secondary | ICD-10-CM | POA: Insufficient documentation

## 2023-09-24 LAB — CMP (CANCER CENTER ONLY)
ALT: 21 U/L (ref 0–44)
AST: 20 U/L (ref 15–41)
Albumin: 4.9 g/dL (ref 3.5–5.0)
Alkaline Phosphatase: 139 U/L — ABNORMAL HIGH (ref 38–126)
Anion gap: 6 (ref 5–15)
BUN: 13 mg/dL (ref 8–23)
CO2: 31 mmol/L (ref 22–32)
Calcium: 10.1 mg/dL (ref 8.9–10.3)
Chloride: 104 mmol/L (ref 98–111)
Creatinine: 0.85 mg/dL (ref 0.44–1.00)
GFR, Estimated: >60 mL/min (ref >60)
Glucose, Bld: 105 mg/dL — ABNORMAL HIGH (ref 70–99)
Potassium: 3.5 mmol/L (ref 3.5–5.1)
Sodium: 141 mmol/L (ref 135–145)
Total Bilirubin: 0.5 mg/dL (ref <1.2)
Total Protein: 8 g/dL (ref 6.5–8.1)

## 2023-09-24 LAB — CBC WITH DIFFERENTIAL (CANCER CENTER ONLY)
Abs Immature Granulocytes: 0.01 10*3/uL (ref 0.00–0.07)
Basophils Absolute: 0 10*3/uL (ref 0.0–0.1)
Basophils Relative: 1 %
Eosinophils Absolute: 0.2 10*3/uL (ref 0.0–0.5)
Eosinophils Relative: 4 %
HCT: 41.6 % (ref 36.0–46.0)
Hemoglobin: 13.5 g/dL (ref 12.0–15.0)
Immature Granulocytes: 0 %
Lymphocytes Relative: 36 %
Lymphs Abs: 1.6 10*3/uL (ref 0.7–4.0)
MCH: 29 pg (ref 26.0–34.0)
MCHC: 32.5 g/dL (ref 30.0–36.0)
MCV: 89.5 fL (ref 80.0–100.0)
Monocytes Absolute: 0.4 10*3/uL (ref 0.1–1.0)
Monocytes Relative: 9 %
Neutro Abs: 2.2 10*3/uL (ref 1.7–7.7)
Neutrophils Relative %: 50 %
Platelet Count: 260 10*3/uL (ref 150–400)
RBC: 4.65 MIL/uL (ref 3.87–5.11)
RDW: 12.4 % (ref 11.5–15.5)
WBC Count: 4.4 10*3/uL (ref 4.0–10.5)
nRBC: 0 % (ref 0.0–0.2)

## 2023-09-24 NOTE — Progress Notes (Signed)
Patient Care Team: Merri Brunette, MD as PCP - General (Internal Medicine) Tessa Lerner, DO as PCP - Cardiology (Cardiology) Marinus Maw, MD as PCP - Electrophysiology (Cardiology) Bensimhon, Bevelyn Buckles, MD as PCP - Advanced Heart Failure (Cardiology) Silverio Lay, MD as Attending Physician (Obstetrics and Gynecology) Serena Croissant, MD as Consulting Physician (Hematology and Oncology) Almond Lint, MD as Consulting Physician (General Surgery) Marinus Maw, MD as Consulting Physician (Cardiology) Pershing Proud, RN as Oncology Nurse Navigator Donnelly Angelica, RN as Oncology Nurse Navigator  DIAGNOSIS:  Encounter Diagnosis  Name Primary?   Malignant neoplasm of upper-outer quadrant of left breast in female, estrogen receptor negative (HCC) Yes    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%, PR 0%, HER-2 negative, Ki-67 31%   09/26/2005 - 11/13/2005 Radiation Therapy   Adjuvant radiation therapy   03/18/2008 Initial Biopsy   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   03/22/2008 Surgery   Right breast lumpectomy 0.9 cm DCIS ER 90% PR 98% stage 0   04/11/2008 Procedure   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   05/10/2008 - 07/02/2008 Radiation Therapy   Adjuvant radiation therapy   07/17/2008 - 04/16/2009 Anti-estrogen oral therapy   Femara then switched to tamoxifen  which was discontinued in 2010 due to concern about uterine cancer   05/31/2021 Cancer Staging   Staging form: Breast, AJCC 7th Edition - Clinical: Stage IA (rT1c, N0, M0) - Signed by Serena Croissant, MD on 05/31/2021 Stage prefix: Recurrence Laterality: Right Biopsy of metastatic site performed: No Estrogen receptor status: Positive Progesterone receptor status: Negative HER2 status: Positive   09/12/2021 Cancer Staging   Staging form: Breast, AJCC 7th Edition - Pathologic: Stage IIA (T2, N0, cM0) - Signed by Serena Croissant, MD on 09/12/2021 Estrogen receptor status: Positive Progesterone receptor status: Positive HER2 status: Negative   10/05/2022 Relapse/Recurrence   Palpable lump in the right mastectomy scar: 1.4 x 1.3 x 1.3 cm and a 0.4 cm mass (presence of small residual fluid): Biopsy revealed grade 3 IDC with necrosis ER 90% PR 0% Ki-67 70%, HER2 3+ positive   11/06/2022 Imaging   CT chest/abdomen/pelvis Developing small nodule along the right chest wall lateral to the pectoralis muscle inferior to the battery pack of the patient's defibrillator. A malignant lesion is possible. Recommend further workup such as ultrasound.   No developing other new mass lesion, fluid collection or lymph node enlargement.   Persistent sclerosis diffusely along the sternum.   Fatty liver infiltration.   Left-sided renal stone   Aortic atherosclerosis.  ICD 10 code-I70.0    11/06/2022 Imaging   Intense sternal/manubrial radiotracer uptake corresponding with the expansile lucency and sclerosis seen on CT is compatible with osseous metastatic disease.   No other convincing scintigraphic evidence of osteoblastic metastatic disease   12/04/2022 Surgery   Right breast mastectomy scar with chest wall mass 3  exertion: Grade 3 poorly differentiated ductal adenocarcinoma with extensive tumor necrosis, margins negative involving subcutis and deep dermis, vascular invasion present.  ER 90%, PR 0%,  Ki-67 70%, HER2 3+ positive Left breast mastectomy scar reexcision: Benign   12/04/2022 Relapse/Recurrence   12/04/2022: Right breast mastectomy scar with chest wall mass 3 exertion: Grade 3 poorly differentiated ductal adenocarcinoma with extensive tumor necrosis, margins negative involving subcutis and deep dermis, vascular invasion present. ER 90%, PR 0%, Ki-67 70%, HER2 3+ positive    02/06/2023 -  Chemotherapy   Patient is on Treatment Plan : BREAST MAINTENANCE Trastuzumab IV (6) or SQ (600) D1 q21d X 11 Cycles     Breast cancer of upper-outer quadrant of right female breast (HCC)  05/23/2021 Relapse/Recurrence   Screening detected right breast mass at 10 o'clock position measuring 1.2 cm, ultrasound-guided biopsy revealed grade 3 IDC ER 20%, PR 0%, HER2 positive 3+, Ki-67 50%   09/04/2021 Surgery   Bilateral mastectomies Left mastectomy: Benign Right mastectomy: Grade 3 IDC with DCIS 3.6 cm, 0/6 lymph nodes negative, ER 20%, PR 0%, HER2 positive by IHC, Ki-67 50%   11/21/2021 - 01/23/2022 Chemotherapy   Patient is on Treatment Plan : BREAST Trastuzumab q21d     04/2022 -  Anti-estrogen oral therapy   Anastrozole daily; changed to Exemestane     CHIEF COMPLIANT: Follow-up to discuss treatment plan  HISTORY OF PRESENT ILLNESS:   History of Present Illness   The patient, with a history of breast cancer and heart issues, presents with fatigue and shortness of breath. The patient reports feeling especially tired and out of breath when walking, even short distances. The patient also reports feeling "clammy" and cold frequently, but is unsure if these symptoms are related to her medications or another underlying issue.  The patient has recently stopped Herceptin treatment due to concerns about its impact on her heart. The patient is currently seeing a cardiologist and is scheduled for surgery to move a pacemaker to allow for radiation treatment. The patient's heart function has dropped  significantly, from 30-35% to 20-25%, and she is hoping for it to recover spontaneously.  In addition to the heart issues, the patient is dealing with an overactive thyroid. The patient is considering different treatments for thyroid, but is concerned about potential side effects and damage to other organs.         ALLERGIES:  is allergic to bactrim [sulfamethoxazole-trimethoprim], aspirin, sulfa antibiotics, and tape.  MEDICATIONS:  Current Outpatient Medications  Medication Sig Dispense Refill   albuterol (VENTOLIN HFA) 108 (90 Base) MCG/ACT inhaler Inhale 2 puffs into the lungs every 6 (six) hours as needed for wheezing or shortness of breath.     b complex vitamins capsule Take 1 capsule by mouth daily.     baclofen (LIORESAL) 10 MG tablet Take 10 mg by mouth as needed for muscle spasms.     bumetanide (BUMEX) 1 MG tablet TAKE 1 TABLET BY MOUTH EVERY DAY 90 tablet 1   cetirizine (ZYRTEC) 10 MG tablet Take 10 mg by mouth in the morning.     Cholecalciferol (VITAMIN D3) 25 MCG (1000 UT) CAPS Take 1,000 Units by mouth in the morning.     diclofenac Sodium (VOLTAREN) 1 % GEL Apply 1 Application topically 4 (four) times daily as needed (pain).     exemestane (AROMASIN) 25 MG tablet TAKE 1 TABLET (25 MG TOTAL) BY MOUTH DAILY AFTER BREAKFAST. 90 tablet 3   FARXIGA 10 MG TABS tablet TAKE 1  TABLET BY MOUTH EVERY DAY BEFORE BREAKFAST 90 tablet 0   fluticasone (FLONASE) 50 MCG/ACT nasal spray Place 1 spray into both nostrils in the morning.     GLUCOSAMINE-CHONDROITIN PO Take 1 tablet by mouth every evening.      glucose blood (ONETOUCH VERIO) test strip TEST ONCE DAILY AS DIRECTED 90     ibuprofen (ADVIL) 200 MG tablet Take 400 mg by mouth every 6 (six) hours as needed for mild pain or moderate pain.     metFORMIN (GLUCOPHAGE-XR) 500 MG 24 hr tablet Take 500 mg by mouth 2 (two) times daily.     metoprolol succinate (TOPROL-XL) 25 MG 24 hr tablet TAKE 1 TABLET (25 MG TOTAL) BY MOUTH IN THE  MORNING. HOLD IF SYSTOLIC BLOOD PRESSURE (TOP BLOOD PRESSURE NUMBER) LESS THAN 100 MMHG OR HEART RATE LESS THAN 60 BPM (PULSE). 90 tablet 3   Multiple Vitamin (MULTIVITAMIN WITH MINERALS) TABS tablet Take 1 tablet by mouth daily. Centrum Silver for Women 50+     nitroGLYCERIN (NITROSTAT) 0.4 MG SL tablet Place 0.4 mg under the tongue every 5 (five) minutes as needed for chest pain.     Olopatadine HCl 0.2 % SOLN Place 1 drop into both eyes daily. Pataday     ondansetron (ZOFRAN) 4 MG tablet Take 1 tablet (4 mg total) by mouth every 6 (six) hours as needed for nausea or vomiting. 20 tablet 0   OneTouch Delica Lancets 33G MISC Apply topically.     Probiotic Product (PROBIOTIC DAILY PO) Take 1 capsule by mouth in the morning.     Propylene Glycol (SYSTANE BALANCE OP) Apply 1 drop to eye 2 (two) times daily.     rosuvastatin (CRESTOR) 20 MG tablet Take 20 mg by mouth at bedtime.     sacubitril-valsartan (ENTRESTO) 24-26 MG Take 1 tablet by mouth 2 (two) times daily. 60 tablet 6   sertraline (ZOLOFT) 50 MG tablet Take 50 mg by mouth in the morning.     spironolactone (ALDACTONE) 25 MG tablet TAKE 1 TABLET BY MOUTH EVERY DAY IN THE MORNING 90 tablet 0   No current facility-administered medications for this visit.    PHYSICAL EXAMINATION: ECOG PERFORMANCE STATUS: 1 - Symptomatic but completely ambulatory  Vitals:   09/24/23 1202  BP: (!) 125/54  Pulse: 88  Resp: 18  Temp: (!) 97.1 F (36.2 C)  SpO2: 100%   Filed Weights   09/24/23 1202  Weight: 188 lb 6.4 oz (85.5 kg)      LABORATORY DATA:  I have reviewed the data as listed    Latest Ref Rng & Units 09/24/2023   11:46 AM 06/17/2023    8:02 AM 11/26/2022    8:21 AM  CMP  Glucose 70 - 99 mg/dL 161  096  045   BUN 8 - 23 mg/dL 13  14  8    Creatinine 0.44 - 1.00 mg/dL 4.09  8.11  9.14   Sodium 135 - 145 mmol/L 141  139  137   Potassium 3.5 - 5.1 mmol/L 3.5  3.4  3.7   Chloride 98 - 111 mmol/L 104  103  102   CO2 22 - 32 mmol/L 31   28  23    Calcium 8.9 - 10.3 mg/dL 78.2  9.7  9.7   Total Protein 6.5 - 8.1 g/dL 8.0  7.8    Total Bilirubin <1.2 mg/dL 0.5  0.5    Alkaline Phos 38 - 126 U/L 139  129    AST  15 - 41 U/L 20  21    ALT 0 - 44 U/L 21  25      Lab Results  Component Value Date   WBC 4.4 09/24/2023   HGB 13.5 09/24/2023   HCT 41.6 09/24/2023   MCV 89.5 09/24/2023   PLT 260 09/24/2023   NEUTROABS 2.2 09/24/2023    ASSESSMENT & PLAN:  Malignant neoplasm of upper-outer quadrant of left breast in female, estrogen receptor negative (HCC) 12/04/2022: Right breast mastectomy scar with chest wall mass 3 exertion: Grade 3 poorly differentiated ductal adenocarcinoma with extensive tumor necrosis, margins negative involving subcutis and deep dermis, vascular invasion present.  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  Cycle 7 Herceptin (because of cardiomyopathy, Dr. Gala Romney is watching very closely)   Signatera: 01/02/2023: Positive 0.07 MTM/ml Signatera 04/17/2023: Negative   Bone scan 08/06/2023: Increased uptake within the sternum and manubrium similar to before.     Headaches and diarrhea: Was  probably related to metformin.  She reduced the dose of metformin and the diarrhea has improved. Continue with Herceptin infusions every 3 weeks and I will see the patient every 6 weeks. ------------------------------------- Assessment and Plan    Breast Cancer Completed Herceptin treatment. No evidence of recurrence based on Signatera testing. Currently on Anastrozole for prevention of recurrence. -Continue Anastrozole. -Monitor with Signatera testing every 3-6 months.  Cardiac Dysfunction Decreased ejection fraction (20-25%) likely secondary to Herceptin. Currently managed with Entresto, Spironolactone, Toprol, Farxiga, and Bumex. Pacemaker to be moved to facilitate radiation therapy. -Continue current cardiac medications. -Expect spontaneous recovery of heart function as Herceptin effect is reversible. -Plan for pacemaker relocation on 11/07/2023.  Hyperthyroidism Newly diagnosed, considering treatment with Methimazole. Patient experiencing fatigue, weakness, and lack of energy, which may be related to hyperthyroidism. -Consult with endocrinologist regarding Methimazole treatment, considering potential cardiac effects. -Consider delaying thyroid treatment until after pacemaker relocation and stabilization of cardiac function.  General Health Maintenance / Followup Plans -Schedule follow-up appointment in 6 months. -Refill medications as needed.          Orders Placed This Encounter  Procedures   CBC with Differential (Cancer Center Only)    Standing Status:   Future    Standing Expiration Date:   09/23/2024   CMP (Cancer Center only)    Standing Status:   Future    Standing Expiration Date:   09/23/2024   The patient has a good understanding of the overall plan. she agrees with it. she will call with any problems that may develop before the next visit here. Total time spent: 30 mins including face to face time and time spent for planning, charting and co-ordination of care    Tamsen Meek, MD 09/24/23

## 2023-09-24 NOTE — Progress Notes (Signed)
Remote ICD transmission.   

## 2023-09-26 ENCOUNTER — Telehealth: Payer: Self-pay | Admitting: Internal Medicine

## 2023-09-26 NOTE — Telephone Encounter (Signed)
Patient calling in because she was told she will have surgery in Jan 16. But nothing has been schedule. Please advise

## 2023-09-26 NOTE — Telephone Encounter (Signed)
Spoke with patient and she states she was informed that she was going to have surgery on 1/16 at her last OV on 11/26 with Dr. Ladona Ridgel.   She wanted to know why it has not been scheduled yet? She states she has been planning around that date need to be sure that it will be scheduled for 1/16

## 2023-09-30 ENCOUNTER — Other Ambulatory Visit (HOSPITAL_COMMUNITY): Payer: Self-pay | Admitting: Endocrinology

## 2023-09-30 DIAGNOSIS — E059 Thyrotoxicosis, 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: Secondary | ICD-10-CM

## 2023-09-30 NOTE — Telephone Encounter (Signed)
Pt is calling again to check status on appt

## 2023-09-30 NOTE — Telephone Encounter (Signed)
Spoke with Pt. Told her I would follow up with April tomorrow to see where we are on her procedure.

## 2023-10-04 ENCOUNTER — Telehealth (HOSPITAL_COMMUNITY): Payer: Self-pay | Admitting: Vascular Surgery

## 2023-10-04 NOTE — Telephone Encounter (Signed)
LM for pt to call back to schedule °

## 2023-10-04 NOTE — Telephone Encounter (Signed)
Lvm to  make brst f/u w/ echo

## 2023-10-07 ENCOUNTER — Telehealth: Payer: Self-pay

## 2023-10-07 DIAGNOSIS — I428 Other cardiomyopathies: Secondary | ICD-10-CM

## 2023-10-07 NOTE — Telephone Encounter (Signed)
Pt returned my call.... She is aware that she is scheduled for removal of Bi-V ICD from R side and reimplant of Bi-V ICD to L side with Dr. Ladona Ridgel on 11/07/23 at 3:30 pm.   She will go to [facility] for updated labs the week after Christmas.   I went over detailed Instructions with patient and will mail her a copy of instruction letter. She has my direct number and will call with any questions or concerns after she reviews her letter.

## 2023-10-07 NOTE — Telephone Encounter (Signed)
Pt scheduled on 1/16 at 3:30 pm.

## 2023-10-18 ENCOUNTER — Encounter: Payer: Self-pay | Admitting: Hematology and Oncology

## 2023-10-18 DIAGNOSIS — I428 Other cardiomyopathies: Secondary | ICD-10-CM | POA: Diagnosis not present

## 2023-10-18 LAB — CBC

## 2023-10-19 ENCOUNTER — Encounter: Payer: Self-pay | Admitting: Hematology and Oncology

## 2023-10-19 LAB — BASIC METABOLIC PANEL
BUN/Creatinine Ratio: 15 (ref 12–28)
BUN: 10 mg/dL (ref 8–27)
CO2: 25 mmol/L (ref 20–29)
Calcium: 10.2 mg/dL (ref 8.7–10.3)
Chloride: 97 mmol/L (ref 96–106)
Creatinine, Ser: 0.66 mg/dL (ref 0.57–1.00)
Glucose: 109 mg/dL — ABNORMAL HIGH (ref 70–99)
Potassium: 4.1 mmol/L (ref 3.5–5.2)
Sodium: 137 mmol/L (ref 134–144)
eGFR: 93 mL/min/{1.73_m2} (ref >59)

## 2023-10-19 LAB — CBC
Hematocrit: 41.1 % (ref 34.0–46.6)
Hemoglobin: 13.3 g/dL (ref 11.1–15.9)
MCH: 28.8 pg (ref 26.6–33.0)
MCHC: 32.4 g/dL (ref 31.5–35.7)
MCV: 89 fL (ref 79–97)
Platelets: 292 10*3/uL (ref 150–450)
RBC: 4.62 x10E6/uL (ref 3.77–5.28)
RDW: 12.6 % (ref 11.7–15.4)
WBC: 4.8 10*3/uL (ref 3.4–10.8)

## 2023-10-22 ENCOUNTER — Encounter: Payer: Self-pay | Admitting: Hematology and Oncology

## 2023-11-01 ENCOUNTER — Other Ambulatory Visit: Payer: Self-pay | Admitting: Cardiology

## 2023-11-01 DIAGNOSIS — I428 Other cardiomyopathies: Secondary | ICD-10-CM

## 2023-11-05 ENCOUNTER — Encounter (HOSPITAL_COMMUNITY): Payer: Self-pay | Admitting: Internal Medicine

## 2023-11-05 ENCOUNTER — Other Ambulatory Visit: Payer: Self-pay

## 2023-11-05 ENCOUNTER — Telehealth: Payer: Self-pay

## 2023-11-05 NOTE — Telephone Encounter (Signed)
 Called patient and moved her procedure on 1/16 with Dr. Waddell from 300 to 730. She was very grateful for this and excited to move to first thing.   Her Daughter was with her and understood that she needs to be there at 530. She has started holding her Farxiga  that needs to be held for 3 days.

## 2023-11-05 NOTE — Progress Notes (Signed)
 PCP - Clarice Nottingham, MD  Cardiologist - Michele Richardson, DO  Electrophysiologist:  Danelle Birmingham, MD Advanced Heart Failure  Toribio Fuel, MD     PPM/ICD - AICD Device Orders -  Rep Notified -   Chest x-ray - 08-25-22 EKG -  Stress Test - 02-14-18 ECHO - 09-16-23 Cardiac Cath - 03-27-22 MUGA scan 05-10-18  CPAP NO CPAP  Fasting Blood Sugar - per patient between 120-160 Checks Blood Sugar daily if blood sugar are WNL if elevated patient will check again per patient dapagliflozin  propanediol (FARXIGA ) LAST DOSE 11-03-23 Blood Thinner Instructions: denies Aspirin  Instructions:   ERAS Protcol - NPO per Dr Adrian instructions  COVID TEST- n/a  Anesthesia review: no  Patient verbally denies any shortness of breath, fever, cough and chest pain during phone call   -------------  SDW INSTRUCTIONS given:  Your procedure is scheduled on November 07, 2023.  Report to [facility] Main Entrance A at 5:30 A.M., and check in at the Admitting office.  Call this number if you have problems the morning of surgery:  [phone]   Remember:  Do not eat or drink after midnight the night before your surgery      Take these medicines the morning of surgery with A SIP OF WATER  NONE per Dr, Birmingham instructions  As of today, STOP taking any Aspirin  (unless otherwise instructed by your surgeon) Aleve, Naproxen, Ibuprofen , Motrin , Advil , Goody's, BC's, all herbal medications, fish oil, and all vitamins.                      Do not wear jewelry, make up, or nail polish            Do not wear lotions, powders, perfumes/colognes, or deodorant.            Do not shave 48 hours prior to surgery.  Men may shave face and neck.            Do not bring valuables to the hospital.            [facility] is not responsible for any belongings or valuables.  Do NOT Smoke (Tobacco/Vaping) 24 hours prior to your procedure If you use a CPAP at night, you may bring all equipment for your overnight stay.    Contacts, glasses, dentures or bridgework may not be worn into surgery.      For patients admitted to the hospital, discharge time will be determined by your treatment team.   Patients discharged the day of surgery will not be allowed to drive home, and someone needs to stay with them for 24 hours.    Special instructions:   - Preparing For Surgery  Before surgery, you can play an important role. Because skin is not sterile, your skin needs to be as free of germs as possible. You can reduce the number of germs on your skin by washing with CHG (chlorahexidine gluconate) Soap before surgery.  CHG is an antiseptic cleaner which kills germs and bonds with the skin to continue killing germs even after washing.    Oral Hygiene is also important to reduce your risk of infection.  Remember - BRUSH YOUR TEETH THE MORNING OF SURGERY WITH YOUR REGULAR TOOTHPASTE  Please do not use if you have an allergy to CHG or antibacterial soaps. If your skin becomes reddened/irritated stop using the CHG.  Do not shave (including legs and underarms) for at least 48 hours prior to first CHG shower. It  is OK to shave your face.  Please follow these instructions carefully.   Shower the NIGHT BEFORE SURGERY and the MORNING OF SURGERY with DIAL Soap.   Pat yourself dry with a CLEAN TOWEL.  Wear CLEAN PAJAMAS to bed the night before surgery  Place CLEAN SHEETS on your bed the night of your first shower and DO NOT SLEEP WITH PETS.   Day of Surgery: Please shower morning of surgery  Wear Clean/Comfortable clothing the morning of surgery Do not apply any deodorants/lotions.   Remember to brush your teeth WITH YOUR REGULAR TOOTHPASTE.   Questions were answered. Patient verbalized understanding of instructions.

## 2023-11-07 ENCOUNTER — Ambulatory Visit (HOSPITAL_COMMUNITY): Payer: 59

## 2023-11-07 ENCOUNTER — Encounter (HOSPITAL_COMMUNITY): Payer: Self-pay | Admitting: Internal Medicine

## 2023-11-07 ENCOUNTER — Ambulatory Visit (HOSPITAL_BASED_OUTPATIENT_CLINIC_OR_DEPARTMENT_OTHER): Payer: 59 | Admitting: Certified Registered Nurse Anesthetist

## 2023-11-07 ENCOUNTER — Ambulatory Visit (HOSPITAL_COMMUNITY)
Admission: RE | Disposition: A | Discharge status: Home / Self Care | Payer: Self-pay | Source: Ambulatory Visit | Attending: Internal Medicine

## 2023-11-07 ENCOUNTER — Ambulatory Visit (HOSPITAL_COMMUNITY)
Admission: RE | Admit: 2023-11-07 | Discharge: 2023-11-08 | Disposition: A | Discharge status: Home / Self Care | Payer: 59 | Source: Ambulatory Visit | Attending: Internal Medicine | Admitting: Internal Medicine

## 2023-11-07 ENCOUNTER — Ambulatory Visit (HOSPITAL_COMMUNITY): Payer: 59 | Admitting: Certified Registered Nurse Anesthetist

## 2023-11-07 ENCOUNTER — Other Ambulatory Visit: Payer: Self-pay

## 2023-11-07 DIAGNOSIS — I110 Hypertensive heart disease with heart failure: Secondary | ICD-10-CM | POA: Diagnosis not present

## 2023-11-07 DIAGNOSIS — E119 Type 2 diabetes mellitus without complications: Secondary | ICD-10-CM

## 2023-11-07 DIAGNOSIS — I10 Essential (primary) hypertension: Secondary | ICD-10-CM | POA: Diagnosis not present

## 2023-11-07 DIAGNOSIS — I5032 Chronic diastolic (congestive) heart failure: Secondary | ICD-10-CM | POA: Diagnosis not present

## 2023-11-07 DIAGNOSIS — I5022 Chronic systolic (congestive) heart failure: Secondary | ICD-10-CM | POA: Diagnosis present

## 2023-11-07 DIAGNOSIS — I447 Left bundle-branch block, unspecified: Secondary | ICD-10-CM | POA: Diagnosis not present

## 2023-11-07 DIAGNOSIS — Z853 Personal history of malignant neoplasm of breast: Secondary | ICD-10-CM | POA: Insufficient documentation

## 2023-11-07 DIAGNOSIS — T82118A Breakdown (mechanical) of other cardiac electronic device, initial encounter: Secondary | ICD-10-CM | POA: Diagnosis not present

## 2023-11-07 DIAGNOSIS — I428 Other cardiomyopathies: Secondary | ICD-10-CM | POA: Insufficient documentation

## 2023-11-07 DIAGNOSIS — Z4502 Encounter for adjustment and management of automatic implantable cardiac defibrillator: Secondary | ICD-10-CM | POA: Diagnosis not present

## 2023-11-07 DIAGNOSIS — Z9013 Acquired absence of bilateral breasts and nipples: Secondary | ICD-10-CM | POA: Diagnosis not present

## 2023-11-07 DIAGNOSIS — G4730 Sleep apnea, unspecified: Secondary | ICD-10-CM | POA: Diagnosis not present

## 2023-11-07 HISTORY — PX: BIV ICD INSERTION CRT-D: EP1195

## 2023-11-07 HISTORY — PX: LEAD EXTRACTION: EP1211

## 2023-11-07 HISTORY — PX: TRANSESOPHAGEAL ECHOCARDIOGRAM (CATH LAB): EP1270

## 2023-11-07 LAB — SURGICAL PCR SCREEN
MRSA, PCR: NEGATIVE
Staphylococcus aureus: NEGATIVE

## 2023-11-07 LAB — PREPARE RBC (CROSSMATCH)

## 2023-11-07 LAB — GLUCOSE, CAPILLARY
Glucose-Capillary: 131 mg/dL — ABNORMAL HIGH (ref 70–99)
Glucose-Capillary: 132 mg/dL — ABNORMAL HIGH (ref 70–99)

## 2023-11-07 LAB — ABO/RH: ABO/RH(D): O POS

## 2023-11-07 SURGERY — LEAD EXTRACTION
Anesthesia: General

## 2023-11-07 MED ORDER — LACTATED RINGERS IV SOLN: INTRAVENOUS | Status: DC | PRN

## 2023-11-07 MED ORDER — CHLORHEXIDINE GLUCONATE 0.12 % MT SOLN
15.0000 mL | Freq: Once | OROMUCOSAL | Status: AC
  Administered 2023-11-07: 15 mL via OROMUCOSAL
  Filled 2023-11-07: qty 15

## 2023-11-07 MED ORDER — CEFAZOLIN SODIUM-DEXTROSE 2-4 GM/100ML-% IV SOLN
2.0000 g | Freq: Four times a day (QID) | INTRAVENOUS | Status: AC
  Administered 2023-11-07 – 2023-11-08 (×3): 2 g via INTRAVENOUS
  Filled 2023-11-07 (×3): qty 100

## 2023-11-07 MED ORDER — ACETAMINOPHEN 325 MG PO TABS
ORAL_TABLET | ORAL | Status: AC
  Filled 2023-11-07: qty 2

## 2023-11-07 MED ORDER — FENTANYL CITRATE (PF) 100 MCG/2ML IJ SOLN
INTRAMUSCULAR | Status: AC
  Filled 2023-11-07: qty 2

## 2023-11-07 MED ORDER — SODIUM CHLORIDE 0.9 % IV SOLN: INTRAVENOUS | Status: AC | PRN

## 2023-11-07 MED ORDER — LACTATED RINGERS IV SOLN
INTRAVENOUS | Status: DC | PRN
Start: 2023-11-07 — End: 2023-11-07

## 2023-11-07 MED ORDER — SODIUM CHLORIDE 0.9 % IV SOLN: INTRAVENOUS | Status: DC

## 2023-11-07 MED ORDER — LIDOCAINE 2% (20 MG/ML) 5 ML SYRINGE
INTRAMUSCULAR | Status: DC | PRN
  Administered 2023-11-07: 50 mg via INTRAVENOUS

## 2023-11-07 MED ORDER — SODIUM CHLORIDE 0.9% FLUSH: 3.0000 mL | INTRAVENOUS | Status: DC | PRN

## 2023-11-07 MED ORDER — SODIUM CHLORIDE 0.9% IV SOLUTION: Freq: Once | INTRAVENOUS | Status: DC

## 2023-11-07 MED ORDER — NOREPINEPHRINE 4 MG/250ML-% IV SOLN
INTRAVENOUS | Status: DC | PRN
  Administered 2023-11-07: 2 ug/min via INTRAVENOUS

## 2023-11-07 MED ORDER — ETOMIDATE 2 MG/ML IV SOLN
INTRAVENOUS | Status: DC | PRN
  Administered 2023-11-07: 10 mg via INTRAVENOUS

## 2023-11-07 MED ORDER — INSULIN ASPART 100 UNIT/ML IJ SOLN: 0.0000 [IU] | INTRAMUSCULAR | Status: DC | PRN

## 2023-11-07 MED ORDER — SODIUM CHLORIDE 0.9 % IV SOLN
INTRAVENOUS | Status: AC
  Filled 2023-11-07 (×2): qty 2

## 2023-11-07 MED ORDER — ACETAMINOPHEN 325 MG PO TABS
325.0000 mg | ORAL_TABLET | ORAL | Status: DC | PRN
  Administered 2023-11-07 – 2023-11-08 (×4): 650 mg via ORAL
  Filled 2023-11-07 (×3): qty 2

## 2023-11-07 MED ORDER — DEXAMETHASONE SODIUM PHOSPHATE 10 MG/ML IJ SOLN
INTRAMUSCULAR | Status: DC | PRN
  Administered 2023-11-07: 8 mg via INTRAVENOUS

## 2023-11-07 MED ORDER — SODIUM CHLORIDE 0.9% FLUSH
3.0000 mL | Freq: Two times a day (BID) | INTRAVENOUS | Status: DC
Start: 2023-11-07 — End: 2023-11-07

## 2023-11-07 MED ORDER — SUGAMMADEX SODIUM 200 MG/2ML IV SOLN
INTRAVENOUS | Status: DC | PRN
  Administered 2023-11-07: 200 mg via INTRAVENOUS

## 2023-11-07 MED ORDER — FENTANYL CITRATE (PF) 250 MCG/5ML IJ SOLN
INTRAMUSCULAR | Status: DC | PRN
  Administered 2023-11-07 (×3): 50 ug via INTRAVENOUS
  Administered 2023-11-07: 25 ug via INTRAVENOUS
  Administered 2023-11-07: 100 ug via INTRAVENOUS
  Administered 2023-11-07: 25 ug via INTRAVENOUS

## 2023-11-07 MED ORDER — CHLORHEXIDINE GLUCONATE 4 % EX SOLN
4.0000 | Freq: Once | CUTANEOUS | Status: DC
  Filled 2023-11-07: qty 60

## 2023-11-07 MED ORDER — ROCURONIUM BROMIDE 10 MG/ML (PF) SYRINGE
PREFILLED_SYRINGE | INTRAVENOUS | Status: DC | PRN
  Administered 2023-11-07: 30 mg via INTRAVENOUS
  Administered 2023-11-07: 50 mg via INTRAVENOUS
  Administered 2023-11-07: 10 mg via INTRAVENOUS

## 2023-11-07 MED ORDER — POVIDONE-IODINE 10 % EX SWAB
2.0000 | Freq: Once | CUTANEOUS | Status: AC
  Administered 2023-11-07: 2 via TOPICAL

## 2023-11-07 MED ORDER — IOHEXOL 350 MG/ML SOLN
INTRAVENOUS | Status: DC | PRN
  Administered 2023-11-07: 20 mL

## 2023-11-07 MED ORDER — ONDANSETRON HCL 4 MG/2ML IJ SOLN
INTRAMUSCULAR | Status: DC | PRN
  Administered 2023-11-07: 4 mg via INTRAVENOUS

## 2023-11-07 MED ORDER — SODIUM CHLORIDE 0.9 % IV SOLN
80.0000 mg | INTRAVENOUS | Status: AC
  Administered 2023-11-07 (×2): 80 mg

## 2023-11-07 MED ORDER — PHENYLEPHRINE 80 MCG/ML (10ML) SYRINGE FOR IV PUSH (FOR BLOOD PRESSURE SUPPORT)
PREFILLED_SYRINGE | INTRAVENOUS | Status: DC | PRN
  Administered 2023-11-07 (×3): 80 ug via INTRAVENOUS

## 2023-11-07 MED ORDER — CEFAZOLIN SODIUM-DEXTROSE 2-4 GM/100ML-% IV SOLN
2.0000 g | INTRAVENOUS | Status: AC
  Administered 2023-11-07: 2 g via INTRAVENOUS
  Filled 2023-11-07: qty 100

## 2023-11-07 MED ORDER — ONDANSETRON HCL 4 MG/2ML IJ SOLN
4.0000 mg | Freq: Four times a day (QID) | INTRAMUSCULAR | Status: DC | PRN
Start: 2023-11-07 — End: 2023-11-08

## 2023-11-07 SURGICAL SUPPLY — 26 items
CABLE SURGICAL S-101-97-12 (CABLE) ×1 IMPLANT
CATH CPS DIRECT 135 DS2C020 (CATHETERS) IMPLANT
CATH CPS LOCATOR 3D MED (CATHETERS) IMPLANT
CATH JOSEPH QUAD ALLRED 6F REP (CATHETERS) IMPLANT
COIL ONE TIE COMPRESSION (VASCULAR PRODUCTS) IMPLANT
EXTENDER BULLDOG LEAD (MISCELLANEOUS) IMPLANT
FELT TEFLON 1X6 (MISCELLANEOUS) ×1 IMPLANT
HELIX LOCKING TOOL (MISCELLANEOUS) ×1 IMPLANT
KIT WRENCH PACEMAKER ASSEM (MISCELLANEOUS) IMPLANT
LEAD DURATA 7122Q-65CM (Lead) IMPLANT
LEAD QUICKFLEX 1258T-86 (Lead) IMPLANT
LEAD ULTIPACE 52 LPA1231/52 (Lead) IMPLANT
LEAD ULTIPACE 65 LPA1231/65 (Lead) IMPLANT
PAD DEFIB RADIO PHYSIO CONN (PAD) ×1 IMPLANT
SHEATH 7FR PRELUDE SNAP 13 (SHEATH) IMPLANT
SHEATH 9.5FR PRELUDE SNAP 13 (SHEATH) IMPLANT
SHEATH EVOLUTION SHORTE RL 11F (SHEATH) IMPLANT
SHEATH PINNACLE 6F 10CM (SHEATH) IMPLANT
SHEATH PROBE COVER 6X72 (BAG) IMPLANT
SHEATH WORLEY 9FR 62CM (SHEATH) IMPLANT
SLITTER AGILIS HISPRO (INSTRUMENTS) IMPLANT
TOOL HELIX LOCKING (MISCELLANEOUS) IMPLANT
TRAY PACEMAKER INSERTION (PACKS) ×1 IMPLANT
WIRE ACUITY WHISPER EDS 4648 (WIRE) IMPLANT
WIRE ASAHI PROWATER 180CM (WIRE) IMPLANT
WIRE HI TORQ VERSACORE-J 145CM (WIRE) IMPLANT

## 2023-11-07 NOTE — Transfer of Care (Signed)
Immediate Anesthesia Transfer of Care Note  Patient: Jordan Willis  Procedure(s) Performed: LEAD EXTRACTION BIV ICD INSERTION CRT-D TRANSESOPHAGEAL ECHOCARDIOGRAM  Patient Location: PACU and Cath Lab  Anesthesia Type:General  Level of Consciousness: awake and alert   Airway & Oxygen Therapy: Patient Spontanous Breathing and Patient connected to nasal cannula oxygen  Post-op Assessment: Report given to RN and Post -op Vital signs reviewed and stable  Post vital signs: Reviewed and stable  Last Vitals:  Vitals Value Taken Time  BP 136/73 11/07/23 1220  Temp    Pulse 92 11/07/23 1225  Resp 22 11/07/23 1225  SpO2 100 % 11/07/23 1225  Vitals shown include unfiled device data.  Last Pain:  Vitals:   11/07/23 0633  TempSrc:   PainSc: 6       Patients Stated Pain Goal: 0 (11/07/23 0981)  Complications: There were no known notable events for this encounter.

## 2023-11-07 NOTE — Discharge Summary (Cosign Needed)
ELECTROPHYSIOLOGY PROCEDURE DISCHARGE SUMMARY    Patient ID: Jordan Willis,  MRN: [id number], DOB/AGE: 73-May-1952 73 y.o.  Admit date: 11/07/2023 Discharge date: 11/08/2023  Primary Care Physician: Merri Brunette, MD  Primary Cardiologist: Tessa Lerner, DO  Electrophysiologist: Dr. Ladona Ridgel    Primary Diagnosis:  Chronic systolic CHF  LBBB  Secondary Diagnosis: Prior left breast surgery  Allergies  Allergen Reactions   Bactrim [Sulfamethoxazole-Trimethoprim] Shortness Of Breath    Shortness of breath, difficulty breathing, headache, rash, fatigue   Aspirin Palpitations and Other (See Comments)    Heart fluttering, pt takes 40.5 mg (half of 81 mg) daily Other reaction(s): bradycardia, Not available   Sulfa Antibiotics Hives   Tape Rash    Tears skin off     Procedures This Admission:  Extraction of a right sided dual chamber ICD due to inability to add CRT.  2.  Implantation of a Abbott CRT-D on 11/06/2022 by Dr. Ladona Ridgel.  The patient received a Abbott Unify Asur CRT [id number] with Abbott Ultipace 1231-52 right atrial lead and Abbott Ultipace 1231-65 right ventricular lead in the left bundle position. Pt additionally received a Abbott Durata [id number] ICD lead. DFTs were deferred at time of implant There were no post procedure complications 3.  CXR on 11/08/23 demonstrated no pneumothorax status post device implantation.      Brief HPI: Jordan Willis is a 73 y.o. female was referred to electrophysiology in the outpatient setting for extraction and reimplantation of ICD due to need for CRT.  Past medical history includes above. Risks, benefits, and alternatives to ICD extraction were reviewed with the patient who wished to proceed.   Hospital Course:  The patient was admitted and underwent extraction and The pt then underwent implantation of a Abbott CRT-D with details as outlined above.  (Left bundle pacing) They were monitored on telemetry overnight which demonstrated  appropriate pacing .  Left chest was without hematoma or ecchymosis.  The device was interrogated and found to be functioning normally.  CXR was obtained and demonstrated no pneumothorax status post device implantation..  Wound care, arm mobility, and restrictions were reviewed with the patient.  The patient was examined and considered stable for discharge to home.   The patient's discharge medications include an ACE-I/ARB/ARNI Sherryll Burger) and beta blocker (Toprol).   Anticoagulation resumption This patient is not on anticoagulation.  Physical Exam: Vitals:   11/07/23 2045 11/08/23 0018 11/08/23 0501 11/08/23 0831  BP: (!) 136/46 (!) 149/49 (!) 147/50 (!) 146/58  Pulse: 72 69 69 69  Resp: 18 20 18 20   Temp: 98.1 F (36.7 C) 98.3 F (36.8 C) 98.5 F (36.9 C) 98.7 F (37.1 C)  TempSrc: Oral Oral Oral Axillary  SpO2: 100% 97% 100% 99%  Weight:      Height:        GEN- NAD. A&O x 3.  HEENT: Normocephalic, atraumatic Lungs- CTAB, normal effort.  Heart- RRR. No M/G/R.  GI- Soft, NT, ND.  Extremities- No clubbing, cyanosis, or edema Skin- Warm and dry, no rash or lesion. ICD site stable.  Discharge Medications:  Allergies as of 11/08/2023       Reactions   Bactrim [sulfamethoxazole-trimethoprim] Shortness Of Breath   Shortness of breath, difficulty breathing, headache, rash, fatigue   Aspirin Palpitations, Other (See Comments)   Heart fluttering, pt takes 40.5 mg (half of 81 mg) daily Other reaction(s): bradycardia, Not available   Sulfa Antibiotics Hives   Tape Rash   Tears skin off  Medication List     TAKE these medications    albuterol 108 (90 Base) MCG/ACT inhaler Commonly known as: VENTOLIN HFA Inhale 2 puffs into the lungs every 6 (six) hours as needed for wheezing or shortness of breath.   atorvastatin 20 MG tablet Commonly known as: LIPITOR Take 20 mg by mouth daily.   baclofen 10 MG tablet Commonly known as: LIORESAL Take 10 mg by mouth daily as  needed for muscle spasms.   bumetanide 1 MG tablet Commonly known as: BUMEX TAKE 1 TABLET BY MOUTH EVERY DAY   cetirizine 10 MG tablet Commonly known as: ZYRTEC Take 10 mg by mouth in the morning.   cyanocobalamin 1000 MCG tablet Commonly known as: VITAMIN B12 Take 1,000 mcg by mouth daily.   diclofenac Sodium 1 % Gel Commonly known as: VOLTAREN Apply 1 Application topically 4 (four) times daily as needed (pain).   ECHINEX PO Take 1 tablet by mouth daily.   Entresto 49-51 MG Generic drug: sacubitril-valsartan Take 0.5 tablets by mouth 2 (two) times daily. What changed: Another medication with the same name was removed. Continue taking this medication, and follow the directions you see here.   exemestane 25 MG tablet Commonly known as: AROMASIN TAKE 1 TABLET (25 MG TOTAL) BY MOUTH DAILY AFTER BREAKFAST.   Farxiga 10 MG Tabs tablet Generic drug: dapagliflozin propanediol TAKE 1 TABLET BY MOUTH EVERY DAY BEFORE BREAKFAST   fluticasone 50 MCG/ACT nasal spray Commonly known as: FLONASE Place 1 spray into both nostrils in the morning.   GLUCOSAMINE-CHONDROITIN PO Take 1 tablet by mouth every evening.   ibuprofen 200 MG tablet Commonly known as: ADVIL Take 400 mg by mouth every 6 (six) hours as needed for mild pain or moderate pain.   metFORMIN 500 MG 24 hr tablet Commonly known as: GLUCOPHAGE-XR Take 500 mg by mouth 2 (two) times daily.   metoprolol succinate 25 MG 24 hr tablet Commonly known as: TOPROL-XL TAKE 1 TABLET (25 MG TOTAL) BY MOUTH IN THE MORNING. HOLD IF SYSTOLIC BLOOD PRESSURE (TOP BLOOD PRESSURE NUMBER) LESS THAN 100 MMHG OR HEART RATE LESS THAN 60 BPM (PULSE).   multivitamin with minerals Tabs tablet Take 1 tablet by mouth daily. Complete   nitroGLYCERIN 0.4 MG SL tablet Commonly known as: NITROSTAT Place 0.4 mg under the tongue every 5 (five) minutes as needed for chest pain.   Olopatadine HCl 0.2 % Soln Place 1 drop into both eyes daily as  needed (allergy). Pataday   ondansetron 4 MG tablet Commonly known as: ZOFRAN Take 1 tablet (4 mg total) by mouth every 6 (six) hours as needed for nausea or vomiting.   OneTouch Delica Lancets 33G Misc Apply topically.   OneTouch Verio test strip Generic drug: glucose blood TEST ONCE DAILY AS DIRECTED 90   pantoprazole 40 MG tablet Commonly known as: PROTONIX Take 40 mg by mouth daily as needed (Stomach problem).   PROBIOTIC DAILY PO Take 1 capsule by mouth in the morning.   rosuvastatin 20 MG tablet Commonly known as: CRESTOR Take 20 mg by mouth at bedtime.   sertraline 50 MG tablet Commonly known as: ZOLOFT Take 50 mg by mouth in the morning.   spironolactone 25 MG tablet Commonly known as: ALDACTONE TAKE 1 TABLET BY MOUTH EVERY DAY IN THE MORNING   SYSTANE BALANCE OP Place 1 drop into both eyes 2 (two) times daily.   Vitamin D3 25 MCG (1000 UT) Caps Take 1,000 Units by mouth in the morning.  ZINC OXIDE-VITAMIN C PO Take 1 tablet by mouth daily. With vit D        Disposition: Home with usual follow up as in AVS  Duration of Discharge Encounter:  APP time: 25 minutes  Signed, Graciella Freer, PA-C  11/08/2023 8:50 AM  EP Attending  Patient seen and examined. Agree with the findings as noted above. The patient is stable after removal of her previously implanted ICD insertion of a new ICD. Interrogation under my direction demonstrates normal Biv ICD function. The paced QRS was around 130 ms. She will be discharged home with the usual followup.   Sharlot Gowda Leala Bryand,MD

## 2023-11-07 NOTE — Anesthesia Preprocedure Evaluation (Addendum)
Anesthesia Evaluation  Patient identified by MRN, date of birth, ID band Patient awake    Reviewed: Allergy & Precautions, NPO status , Patient's Chart, lab work & pertinent test results  History of Anesthesia Complications (+) PONV and history of anesthetic complications  Airway Mallampati: III     Mouth opening: Limited Mouth Opening  Dental  (+) Teeth Intact, Dental Advisory Given   Pulmonary sleep apnea    breath sounds clear to auscultation       Cardiovascular hypertension, Pt. on home beta blockers and Pt. on medications + dysrhythmias + Cardiac Defibrillator  Rhythm:Regular Rate:Normal  Echo:  1. Left ventricular ejection fraction, by estimation, is 20 to 25%. The  left ventricle has severely decreased function. The left ventricle  demonstrates global hypokinesis. Septal motion consistent with LBBB. Left  ventricular diastolic parameters are  consistent with Grade I diastolic dysfunction (impaired relaxation). The  LV appears hypertrabeculated.   2. Right ventricular systolic function is normal. The right ventricular  size is normal. There is normal pulmonary artery systolic pressure.   3. The mitral valve is normal in structure. Mild to moderate mitral valve  regurgitation. No evidence of mitral stenosis.   4. The aortic valve is normal in structure. There is mild calcification  of the aortic valve. There is mild thickening of the aortic valve. Aortic  valve regurgitation is not visualized. No aortic stenosis is present.   5. The inferior vena cava is normal in size with greater than 50%  respiratory variability, suggesting right atrial pressure of 3 mmHg.     Neuro/Psych  Headaches  Anxiety        GI/Hepatic Neg liver ROS,GERD  Medicated,,  Endo/Other  diabetes, Type 2, Oral Hypoglycemic Agents    Renal/GU negative Renal ROS     Musculoskeletal  (+) Arthritis ,    Abdominal   Peds  Hematology  (+)  Blood dyscrasia, anemia   Anesthesia Other Findings   Reproductive/Obstetrics                             Anesthesia Physical Anesthesia Plan  ASA: 4  Anesthesia Plan: General   Post-op Pain Management: Minimal or no pain anticipated   Induction: Intravenous  PONV Risk Score and Plan: 4 or greater and Ondansetron, Dexamethasone and Treatment may vary due to age or medical condition  Airway Management Planned: Oral ETT  Additional Equipment: Arterial line and TEE  Intra-op Plan:   Post-operative Plan: Extubation in OR  Informed Consent: I have reviewed the patients History and Physical, chart, labs and discussed the procedure including the risks, benefits and alternatives for the proposed anesthesia with the patient or authorized representative who has indicated his/her understanding and acceptance.     Dental advisory given  Plan Discussed with: CRNA  Anesthesia Plan Comments:        Anesthesia Quick Evaluation

## 2023-11-07 NOTE — H&P (Signed)
HPI Ms. Glasby returns today for followup. She is a pleasant 73 yo woman with bilateral breast CA, non-ischemic CM, chronic systolic heart failure, class 2, and LBBB. She underwent biv ICD insertion from the right side (she had previously just had left breast surgery). Her CS was quite toruous and her veins diminutive and we opted to placed a left bundle area lead. Her initial ECG post op was narrow but subsequent showed septal capture and pacing induced LBBB. We turned off her pacing. In the interim, she has begun to exercise by going to a pool and doing water aerobics. She can walk but not too fast. Her daughter notes that she got sob when walking into the office today. She had to stop and rest. Allergies       Allergies  Allergen Reactions   Bactrim [Sulfamethoxazole-Trimethoprim] Shortness Of Breath      Shortness of breath, difficulty breathing, headache, rash, fatigue   Aspirin Palpitations and Other (See Comments)      Heart fluttering, pt takes 40.5 mg (half of 81 mg) daily Other reaction(s): bradycardia, Not available   Sulfa Antibiotics Hives   Tape Rash      Tears skin off                Current Outpatient Medications  Medication Sig Dispense Refill   albuterol (VENTOLIN HFA) 108 (90 Base) MCG/ACT inhaler Inhale 2 puffs into the lungs every 6 (six) hours as needed for wheezing or shortness of breath.       b complex vitamins capsule Take 1 capsule by mouth daily.       baclofen (LIORESAL) 10 MG tablet Take 10 mg by mouth as needed for muscle spasms.       bumetanide (BUMEX) 1 MG tablet TAKE 1 TABLET BY MOUTH EVERY DAY 90 tablet 1   cetirizine (ZYRTEC) 10 MG tablet Take 10 mg by mouth in the morning.       Cholecalciferol (VITAMIN D3) 25 MCG (1000 UT) CAPS Take 1,000 Units by mouth in the morning.       diclofenac Sodium (VOLTAREN) 1 % GEL Apply 1 Application topically 4 (four) times daily as needed (pain).       exemestane (AROMASIN) 25 MG tablet TAKE 1 TABLET (25  MG TOTAL) BY MOUTH DAILY AFTER BREAKFAST. 90 tablet 3   FARXIGA 10 MG TABS tablet TAKE 1 TABLET BY MOUTH EVERY DAY BEFORE BREAKFAST 90 tablet 0   fluticasone (FLONASE) 50 MCG/ACT nasal spray Place 1 spray into both nostrils in the morning.       GLUCOSAMINE-CHONDROITIN PO Take 1 tablet by mouth every evening.        glucose blood (ONETOUCH VERIO) test strip TEST ONCE DAILY AS DIRECTED 90       ibuprofen (ADVIL) 200 MG tablet Take 400 mg by mouth every 6 (six) hours as needed for mild pain or moderate pain.       metFORMIN (GLUCOPHAGE-XR) 500 MG 24 hr tablet Take 500 mg by mouth 2 (two) times daily.       metoprolol succinate (TOPROL-XL) 25 MG 24 hr tablet TAKE 1 TABLET (25 MG TOTAL) BY MOUTH IN THE MORNING. HOLD IF SYSTOLIC BLOOD PRESSURE (TOP BLOOD PRESSURE NUMBER) LESS THAN 100 MMHG OR HEART RATE LESS THAN 60 BPM (PULSE). 90 tablet 3   Multiple Vitamin (MULTIVITAMIN WITH MINERALS) TABS tablet Take 1 tablet by mouth daily. Centrum Silver for Women 50+  nitroGLYCERIN (NITROSTAT) 0.4 MG SL tablet Place 0.4 mg under the tongue every 5 (five) minutes as needed for chest pain.       Olopatadine HCl 0.2 % SOLN Place 1 drop into both eyes daily. Pataday       ondansetron (ZOFRAN) 4 MG tablet Take 1 tablet (4 mg total) by mouth every 6 (six) hours as needed for nausea or vomiting. 20 tablet 0   OneTouch Delica Lancets 33G MISC Apply topically.       Probiotic Product (PROBIOTIC DAILY PO) Take 1 capsule by mouth in the morning.       Propylene Glycol (SYSTANE BALANCE OP) Apply 1 drop to eye 2 (two) times daily.       rosuvastatin (CRESTOR) 20 MG tablet Take 20 mg by mouth at bedtime.       sacubitril-valsartan (ENTRESTO) 24-26 MG Take 1 tablet by mouth 2 (two) times daily. 60 tablet 6   sertraline (ZOLOFT) 50 MG tablet Take 50 mg by mouth in the morning.       spironolactone (ALDACTONE) 25 MG tablet TAKE 1 TABLET BY MOUTH EVERY DAY IN THE MORNING 90 tablet 0      No current facility-administered  medications for this visit.              Past Medical History:  Diagnosis Date   AICD (automatic cardioverter/defibrillator) present     Anemia     Anginal pain (HCC)     Anxiety     Arthritis      Lumbar spine DDD.   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   Diabetes mellitus without complication (HCC)      Type II   Dyspnea      When patient gets sick uses Pro-Air inhaler   Frequent sinus infections     GERD (gastroesophageal reflux disease)      occ   Goiter     History of cardiomegaly     History of cataract      Bilateral   History of kidney stones      08/28/2018 currently has a large kidney stone, asymptomatic at this time   History of migraine     History of uterine fibroid     History of uterine prolapse     Hyperlipidemia     Hypertension     LBBB (left bundle branch block)     Menopause 03/03/2012   Nasal turbinate hypertrophy 11/29/2016    Left inferior    Personal history of chemotherapy     Personal history of radiation therapy     Pneumonia      as a child   PONV (postoperative nausea and vomiting)     Sinusitis 2014   Sleep apnea      No CPAP   SUI (stress urinary incontinence, female)     SVD (spontaneous vaginal delivery)      x 2   Thyroid nodule            ROS:    All systems reviewed and negative except as noted in the HPI.          Past Surgical History:  Procedure Laterality Date   ABDOMINAL HYSTERECTOMY       BILATERAL SALPINGOOPHORECTOMY Bilateral  BIV ICD INSERTION CRT-D N/A 08/24/2022    Procedure: BIV ICD INSERTION CRT-D;  Surgeon: Marinus Maw, MD;  Location[facility] INVASIVE CV LAB;  Service: Cardiovascular;  Laterality: N/A;   BLADDER SUSPENSION N/A 05/16/2017    Procedure: TRANSVAGINAL TAPE (TVT) PROCEDURE;  Surgeon: Osborn Coho,  MD;  Location: [facility] ORS;  Service: Gynecology;  Laterality: N/A;   BREAST BIOPSY Right 2009   BREAST IMPLANT REMOVAL Bilateral 02/05/2022    Procedure: Removal bilateral breast implants;  Surgeon: Allena Napoleon, MD;  Location: [facility] OR;  Service: Plastics;  Laterality: Bilateral;   BREAST LUMPECTOMY Right 2009   BREAST LUMPECTOMY Left 2006   BREAST RECONSTRUCTION WITH PLACEMENT OF TISSUE EXPANDER AND FLEX HD (ACELLULAR HYDRATED DERMIS) Bilateral 09/04/2021    Procedure: BILATERAL BREAST RECONSTRUCTION WITH PLACEMENT OF TISSUE EXPANDER AND FLEX HD (ACELLULAR HYDRATED DERMIS);  Surgeon: Allena Napoleon, MD;  Location: [facility] OR;  Service: Plastics;  Laterality: Bilateral;   CARDIAC CATHETERIZATION       CATARACT EXTRACTION Left 10/23/2017   CATARACT EXTRACTION Right 11/06/2017   COLONOSCOPY   10/22/2009    Normal.  Repeat 5 years.  [name]   COLONOSCOPY WITH PROPOFOL N/A 12/21/2022    Procedure: COLONOSCOPY WITH PROPOFOL;  Surgeon: Jeani Hawking, MD;  Location: [facility];  Service: Gastroenterology;  Laterality: N/A;   CYSTOSCOPY N/A 05/16/2017    Procedure: CYSTOSCOPY;  Surgeon: Osborn Coho, MD;  Location: [facility] ORS;  Service: Gynecology;  Laterality: N/A;   DEBRIDEMENT AND CLOSURE WOUND Bilateral 10/03/2021    Procedure: Debridement bilateral mastectomy flaps;  Surgeon: Allena Napoleon, MD;  Location: [facility] OR;  Service: Plastics;  Laterality: Bilateral;  1.5 hour   EYE SURGERY        bilateral cataracts   LEFT HEART CATH AND CORONARY ANGIOGRAPHY N/A 02/09/2020    Procedure: LEFT HEART CATH AND CORONARY ANGIOGRAPHY;  Surgeon: Elder Negus, MD;  Location: [facility] INVASIVE CV LAB;  Service: Cardiovascular;  Laterality: N/A;   LEFT HEART CATH AND CORONARY ANGIOGRAPHY N/A 03/27/2022    Procedure: LEFT HEART CATH AND CORONARY ANGIOGRAPHY;  Surgeon: Yates Decamp, MD;  Location: [facility] INVASIVE CV LAB;  Service: Cardiovascular;  Laterality: N/A;   MASS EXCISION Right 12/04/2022    Procedure: EXCISION OF RIGHT  CHEST WALL MASS;  Surgeon: Almond Lint, MD;  Location: [facility] OR;  Service: General;  Laterality: Right;   MASTECTOMY W/ SENTINEL NODE BIOPSY Right 09/04/2021    Procedure: BILATERAL MASTECTOMIES WITH RIGHT SENTINEL LYMPH NODE BIOPSY;  Surgeon: Almond Lint, MD;  Location: [facility] OR;  Service: General;  Laterality: Right;   REMOVAL OF BILATERAL TISSUE EXPANDERS WITH PLACEMENT OF BILATERAL BREAST IMPLANTS Bilateral 10/03/2021    Procedure: REMOVAL OF BILATERAL TISSUE EXPANDERS WITH REPLACEMENT OF TISSUE EXPANDERS;  Surgeon: Allena Napoleon, MD;  Location: [facility] OR;  Service: Plastics;  Laterality: Bilateral;   REMOVAL OF BILATERAL TISSUE EXPANDERS WITH PLACEMENT OF BILATERAL BREAST IMPLANTS Bilateral 12/19/2021    Procedure: REMOVAL OF BILATERAL TISSUE EXPANDERS WITH PLACEMENT OF BILATERAL BREAST IMPLANTS;  Surgeon: Allena Napoleon, MD;  Location: [facility] OR;  Service: Plastics;  Laterality: Bilateral;   ROBOTIC ASSISTED TOTAL HYSTERECTOMY N/A 05/16/2017    Procedure: ROBOTIC ASSISTED TOTAL HYSTERECTOMY;  Surgeon: Silverio Lay, MD;  Location: [facility] ORS;  Service: Gynecology;  Laterality: N/A;   ROTATOR CUFF REPAIR Bilateral     SCAR REVISION N/A 12/04/2022    Procedure: REVISION OF MASTECTOMY SCAR;  Surgeon: Almond Lint, MD;  Location: [facility] OR;  Service: General;  Laterality: N/A;   SHOULDER ARTHROSCOPY WITH ROTATOR CUFF REPAIR AND SUBACROMIAL DECOMPRESSION Right 09/03/2018    Procedure: Right shoulder manipulation under anesthesia, exam under anesthesia, mini open rotator cuff repair, subacromial decompression;  Surgeon: Jene Every, MD;  Location: [facility] ORS;  Service: Orthopedics;  Laterality: Right;  90 mins   TUBAL LIGATION       WISDOM TOOTH EXTRACTION                     Family History  Problem Relation Age of Onset   Cancer Mother 30        breast cancer   Hypertension Mother     Diabetes Father     Cancer Sister          breast cancer   Breast cancer Sister     Cancer Sister          breast  cancer   Breast cancer Sister              Social History         Socioeconomic History   Marital status: Divorced      Spouse name: n/a   Number of children: 2   Years of education: 12   Highest education level: Not on file  Occupational History   Occupation: Retired      Associate Professor: [employer]      Comment: Diplomatic Services operational officer  Tobacco Use   Smoking status: Never   Smokeless tobacco: Never  Vaping Use   Vaping status: Never Used  Substance and Sexual Activity   Alcohol use: No   Drug use: No   Sexual activity: Not Currently      Partners: Male      Birth control/protection: Surgical, Post-menopausal      Comment: Hysterectomy  Other Topics Concern   Not on file  Social History Narrative    Marital status: divorced; dating seriously x many years.        Children:  2 children; 1 grandchild.       Employment: Research officer, political party.       Lives: alone       Tobacco: none        Alcohol: none       Exercise:  Walking with sister two days per week.       Seatbelt: 100%       Guns: none         Right-handed    Caffeine: occasional coffee, hot tea or green tea a few times per week    Social Determinants of Health    Financial Resource Strain: Not on file  Food Insecurity: Not on file  Transportation Needs: Not on file  Physical Activity: Not on file  Stress: Not on file  Social Connections: Not on file  Intimate Partner Violence: Not on file        BP 108/72   Pulse 83   Ht 5\' 7"  (1.702 m)   Wt 184 lb (83.5 kg)   SpO2 97%   BMI 28.82 kg/m    Physical Exam:   Well appearing NAD HEENT: Unremarkable Neck:  No JVD, no thyromegally Lymphatics:  No adenopathy Back:  No CVA tenderness Lungs:  Clear with no wheezes HEART:  Regular rate rhythm, no murmurs, no rubs, no clicks Abd:  soft, positive bowel sounds, no organomegally, no rebound, no guarding Ext:  2 plus pulses, no edema, no cyanosis, no clubbing Skin:  No rashes no nodules Neuro:  CN II through  XII intact, motor grossly intact   DEVICE  Normal device function.  See PaceArt for details.    Assess/Plan:   Chronic systolic heart failure - her symptoms are class 3. She will continue her current meds and I have encouraged regular exercise. ICD - her device is programmed VVI 60 and she is sensing appropriately and not pacing.  Breast CA - she is s/p bilateral mastectomy. She appears to have finally healed her incisions.  Disposition - I have discussed the treatment options with the patient and her daughter. Her CHF has worsened. She has class 3 symptoms. She has LBBB. She is on maximal GDMT. I have discussed device revision. Based on her anatomy, it would be impossible to get a system with resynchronization pacing from the right side. Too much tortuousity. I have offered her removal of her right sided system. Placement of a new system on the left side now that she has healed. I think a left bundle lead is possible and even a CS lead. I have reviewed the risks/benefits/including the risk of infection and she will discuss with her daughter and let us know if she would like to proceed.   Sharlot Gowda Aniken Monestime,MD

## 2023-11-07 NOTE — Anesthesia Procedure Notes (Signed)
Procedure Name: Intubation Date/Time: 11/07/2023 8:03 AM  Performed by: Little Ishikawa, CRNAPre-anesthesia Checklist: Patient identified, Emergency Drugs available, Suction available, Timeout performed and Patient being monitored Patient Re-evaluated:Patient Re-evaluated prior to induction Oxygen Delivery Method: Circle system utilized Preoxygenation: Pre-oxygenation with 100% oxygen Induction Type: IV induction Ventilation: Mask ventilation without difficulty and Oral airway inserted - appropriate to patient size Laryngoscope Size: Mac and 3 Grade View: Grade I Tube type: Oral Tube size: 7.5 mm Number of attempts: 1 Airway Equipment and Method: Stylet Placement Confirmation: ETT inserted through vocal cords under direct vision, positive ETCO2, CO2 detector and breath sounds checked- equal and bilateral Secured at: 22 cm Tube secured with: Tape Dental Injury: Teeth and Oropharynx as per pre-operative assessment

## 2023-11-07 NOTE — Anesthesia Procedure Notes (Signed)
Arterial Line Insertion Start/End1/16/2025 7:15 AM, 11/07/2023 7:20 AM Performed by: Little Ishikawa, CRNA, CRNA  Patient location: Pre-op. Preanesthetic checklist: patient identified, IV checked, site marked, risks and benefits discussed, surgical consent, monitors and equipment checked, pre-op evaluation, timeout performed and anesthesia consent Lidocaine 1% used for infiltration Left, radial was placed Catheter size: 20 G Hand hygiene performed  and maximum sterile barriers used  Allen's test indicative of satisfactory collateral circulation Attempts: 2 Procedure performed using ultrasound guided technique. Ultrasound Notes:anatomy identified, needle tip was noted to be adjacent to the nerve/plexus identified and no ultrasound evidence of intravascular and/or intraneural injection Following insertion, dressing applied and Biopatch. Post procedure assessment: normal  Patient tolerated the procedure well with no immediate complications.

## 2023-11-07 NOTE — Progress Notes (Signed)
Site area: L groin (venous) Site Prior to Removal:  Level 0 Pressure Applied For: 15 min Manual:   yes Patient Status During Pull:  stable Post Pull Site:  Level 0 Post Pull Instructions Given:  yes Post Pull Pulses Present: +1 bilateral DP Dressing Applied:  gauze & tegarderm Bedrest begins @ 1330 Comments:

## 2023-11-07 NOTE — Progress Notes (Signed)
TCTS  I was on surgical standby and assisted Dr. Ladona Ridgel with lead extraction today for 1 hr and 11 minutes.  Alleen Borne, MD.

## 2023-11-07 NOTE — Anesthesia Postprocedure Evaluation (Addendum)
Anesthesia Post Note  Patient: Jordan Willis  Procedure(s) Performed: LEAD EXTRACTION BIV ICD INSERTION CRT-D TRANSESOPHAGEAL ECHOCARDIOGRAM     Patient location during evaluation: PACU Anesthesia Type: General Level of consciousness: awake and alert Pain management: pain level controlled Vital Signs Assessment: post-procedure vital signs reviewed and stable Respiratory status: spontaneous breathing, nonlabored ventilation, respiratory function stable and patient connected to nasal cannula oxygen Cardiovascular status: blood pressure returned to baseline and stable Postop Assessment: no apparent nausea or vomiting Anesthetic complications: no  There were no known notable events for this encounter.  Last Vitals:  Vitals:   11/07/23 1325 11/07/23 1330  BP: 131/64 121/64  Pulse: 89 87  Resp: (!) 21 (!) 23  Temp:    SpO2: 100% 98%                 Shelton Silvas

## 2023-11-07 NOTE — Plan of Care (Signed)

## 2023-11-08 ENCOUNTER — Ambulatory Visit (HOSPITAL_COMMUNITY): Payer: 59

## 2023-11-08 DIAGNOSIS — R0989 Other specified symptoms and signs involving the circulatory and respiratory systems: Secondary | ICD-10-CM | POA: Diagnosis not present

## 2023-11-08 DIAGNOSIS — I5022 Chronic systolic (congestive) heart failure: Secondary | ICD-10-CM

## 2023-11-08 DIAGNOSIS — Z853 Personal history of malignant neoplasm of breast: Secondary | ICD-10-CM | POA: Diagnosis not present

## 2023-11-08 DIAGNOSIS — I428 Other cardiomyopathies: Secondary | ICD-10-CM | POA: Diagnosis not present

## 2023-11-08 DIAGNOSIS — Z9013 Acquired absence of bilateral breasts and nipples: Secondary | ICD-10-CM | POA: Diagnosis not present

## 2023-11-08 DIAGNOSIS — Z4502 Encounter for adjustment and management of automatic implantable cardiac defibrillator: Secondary | ICD-10-CM | POA: Diagnosis not present

## 2023-11-08 DIAGNOSIS — I110 Hypertensive heart disease with heart failure: Secondary | ICD-10-CM | POA: Diagnosis not present

## 2023-11-08 DIAGNOSIS — Z9889 Other specified postprocedural states: Secondary | ICD-10-CM | POA: Diagnosis not present

## 2023-11-08 DIAGNOSIS — I447 Left bundle-branch block, unspecified: Secondary | ICD-10-CM | POA: Diagnosis not present

## 2023-11-08 LAB — POCT I-STAT, CHEM 8
BUN: 8 mg/dL (ref 8–23)
Calcium, Ion: 1.29 mmol/L (ref 1.15–1.40)
Chloride: 106 mmol/L (ref 98–111)
Creatinine, Ser: 0.6 mg/dL (ref 0.44–1.00)
Glucose, Bld: 140 mg/dL — ABNORMAL HIGH (ref 70–99)
HCT: 36 % (ref 36.0–46.0)
Hemoglobin: 12.2 g/dL (ref 12.0–15.0)
Potassium: 4 mmol/L (ref 3.5–5.1)
Sodium: 140 mmol/L (ref 135–145)
TCO2: 25 mmol/L (ref 22–32)

## 2023-11-08 LAB — POCT ACTIVATED CLOTTING TIME: Activated Clotting Time: 141 s

## 2023-11-08 LAB — POCT I-STAT 7, (LYTES, BLD GAS, ICA,H+H)
Acid-base deficit: 1 mmol/L (ref 0.0–2.0)
Bicarbonate: 24.7 mmol/L (ref 20.0–28.0)
Calcium, Ion: 1.31 mmol/L (ref 1.15–1.40)
HCT: 37 % (ref 36.0–46.0)
Hemoglobin: 12.6 g/dL (ref 12.0–15.0)
O2 Saturation: 99 %
Potassium: 3.9 mmol/L (ref 3.5–5.1)
Sodium: 140 mmol/L (ref 135–145)
TCO2: 26 mmol/L (ref 22–32)
pCO2 arterial: 45.8 mm[Hg] (ref 32–48)
pH, Arterial: 7.34 — ABNORMAL LOW (ref 7.35–7.45)
pO2, Arterial: 145 mm[Hg] — ABNORMAL HIGH (ref 83–108)

## 2023-11-08 MED FILL — Fentanyl Citrate Preservative Free (PF) Inj 100 MCG/2ML: INTRAMUSCULAR | Qty: 6 | Status: AC

## 2023-11-08 NOTE — Discharge Instructions (Signed)
After Your ICD (Implantable Cardiac Defibrillator)   You have a Abbott ICD  ACTIVITY Do not lift your arm above shoulder height for 1 week after your procedure. After 7 days, you may progress as below.  You should remove your sling 24 hours after your procedure, unless otherwise instructed by your provider.     Friday November 15, 2023  Saturday November 16, 2023 Sunday November 17, 2023 Monday November 18, 2023   Do not lift, push, pull, or carry anything over 10 pounds with the affected arm until 6 weeks (Friday December 20, 2023 ) after your procedure.   You may drive AFTER your wound check, unless you have been told otherwise by your provider.   Ask your healthcare provider when you can go back to work   INCISION/Dressing If you are on a blood thinner such as Coumadin, Xarelto, Eliquis, Plavix, or Pradaxa please confirm with your provider when this should be resumed.   If large square, outer bandage is left in place, this can be removed after 24 hours from your procedure. Do not remove steri-strips or glue as below.   Monitor your defibrillator site for redness, swelling, and drainage. Call the device clinic at [phone] if you experience these symptoms or fever/chills.  If your incision is sealed with Steri-strips or staples, you may shower 7 days after your procedure or when told by your provider. Do not remove the steri-strips or let the shower hit directly on your site. You may wash around your site with soap and water.    If you were discharged in a sling, please do not wear this during the day more than 48 hours after your surgery unless otherwise instructed. This may increase the risk of stiffness and soreness in your shoulder.   Avoid lotions, ointments, or perfumes over your incision until it is well-healed.  You may use a hot tub or a pool AFTER your wound check appointment if the incision is completely closed.  Your ICD is designed to protect you from life threatening  heart rhythms. Because of this, you may receive a shock.   1 shock with no symptoms:  Call the office during business hours. 1 shock with symptoms (chest pain, chest pressure, dizziness, lightheadedness, shortness of breath, overall feeling unwell):  Call 911. If you experience 2 or more shocks in 24 hours:  Call 911. If you receive a shock, you should not drive for 6 months per the [place] DMV IF you receive appropriate therapy from your ICD.   ICD Alerts:  Some alerts are vibratory and others beep. These are NOT emergencies. Please call our office to let us know. If this occurs at night or on weekends, it can wait until the next business day. Send a remote transmission.  If your device is capable of reading fluid status (for heart failure), you will be offered monthly monitoring to review this with you.   DEVICE MANAGEMENT Remote monitoring is used to monitor your ICD from home. This monitoring is scheduled every 91 days by our office. It allows [place] to keep an eye on the functioning of your device to ensure it is working properly. You will routinely see your Electrophysiologist annually (more often if necessary).   You should receive your ID card for your new device in 4-8 weeks. Keep this card with you at all times once received. Consider wearing a medical alert bracelet or necklace.  Your ICD  may be MRI compatible. This will be discussed at your next  office visit/wound check.  You should avoid contact with strong electric or magnetic fields.   Do not use amateur (ham) radio equipment or electric (arc) welding torches. MP3 player headphones with magnets should not be used. Some devices are safe to use if held at least 12 inches (30 cm) from your defibrillator. These include power tools, lawn mowers, and speakers. If you are unsure if something is safe to use, ask your health care provider.  When using your cell phone, hold it to the ear that is on the opposite side from the defibrillator. Do not  leave your cell phone in a pocket over the defibrillator.  You may safely use electric blankets, heating pads, computers, and microwave ovens.  Call the office right away if: You have chest pain. You feel more than one shock. You feel more short of breath than you have felt before. You feel more light-headed than you have felt before. Your incision starts to open up.  This information is not intended to replace advice given to you by your health care provider. Make sure you discuss any questions you have with your health care provider.

## 2023-11-08 NOTE — Progress Notes (Signed)
Discharge instructions (including medications) discussed with and copy provided to patient/caregiver 

## 2023-11-08 NOTE — Plan of Care (Signed)

## 2023-11-09 ENCOUNTER — Other Ambulatory Visit: Payer: Self-pay | Admitting: Cardiology

## 2023-11-09 DIAGNOSIS — I10 Essential (primary) hypertension: Secondary | ICD-10-CM

## 2023-11-09 DIAGNOSIS — I5042 Chronic combined systolic (congestive) and diastolic (congestive) heart failure: Secondary | ICD-10-CM

## 2023-11-09 DIAGNOSIS — I428 Other cardiomyopathies: Secondary | ICD-10-CM

## 2023-11-09 DIAGNOSIS — I429 Cardiomyopathy, unspecified: Secondary | ICD-10-CM

## 2023-11-09 LAB — TYPE AND SCREEN
ABO/RH(D): O POS
Antibody Screen: NEGATIVE
Unit division: 0
Unit division: 0
Unit division: 0
Unit division: 0

## 2023-11-09 LAB — BPAM RBC
Blood Product Expiration Date: 202502072359
Blood Product Expiration Date: 202502072359
Blood Product Expiration Date: 202502072359
Blood Product Expiration Date: 202502072359
ISSUE DATE / TIME: 202501160745
ISSUE DATE / TIME: 202501160745
Unit Type and Rh: 5100
Unit Type and Rh: 5100
Unit Type and Rh: 5100
Unit Type and Rh: 5100

## 2023-11-12 ENCOUNTER — Telehealth: Payer: Self-pay | Admitting: Internal Medicine

## 2023-11-12 NOTE — Telephone Encounter (Signed)
Returned call to daughter/Pt.  Per daughter Pt is having discomfort with movement.  Pt had system extraction on right side with replacement on left side.  Per daughter-Pt is not taking tylenol.  Advised to take tylenol in the AM and then every 8 hours x 3 daily while pain is acute.  Also advised to use cool compress to area.    Daughter asked if Pt could get something prescribed for pain.  Advised that we do not prescribe narcotics post device implants.  Scheduled follow up with device clinic tomorrow to assess site.  Daughter and Pt both state site looks ok.  Will continue to monitor.

## 2023-11-12 NOTE — Telephone Encounter (Signed)
Per daughter pt needs pain medication because she is hurting really bad from procedure on the 16th

## 2023-11-12 NOTE — Telephone Encounter (Signed)
Spoke with daughter per DPR and states patient has been having chest pain 3 days ago. She would like her device checked.   Did advise if patient was having active chest pain she will need to go to the ED.   Can you guys check her device.

## 2023-11-13 ENCOUNTER — Telehealth: Payer: Self-pay

## 2023-11-13 ENCOUNTER — Ambulatory Visit
Admission: RE | Admit: 2023-11-13 | Discharge: 2023-11-13 | Disposition: A | Discharge status: Home / Self Care | Payer: 59 | Source: Ambulatory Visit | Attending: Internal Medicine | Admitting: Internal Medicine

## 2023-11-13 ENCOUNTER — Ambulatory Visit: Payer: 59 | Attending: Cardiology

## 2023-11-13 DIAGNOSIS — I5022 Chronic systolic (congestive) heart failure: Secondary | ICD-10-CM

## 2023-11-13 DIAGNOSIS — R0602 Shortness of breath: Secondary | ICD-10-CM

## 2023-11-13 DIAGNOSIS — R079 Chest pain, unspecified: Secondary | ICD-10-CM | POA: Diagnosis not present

## 2023-11-13 MED ORDER — METOLAZONE 2.5 MG PO TABS: 2.5000 mg | ORAL_TABLET | ORAL | 0 refills | Status: DC

## 2023-11-13 NOTE — Progress Notes (Signed)
Pt seen in device clinic for complaints of increased chest discomfort and shortness of breath.  Pt with recent procedure on 08/06/2024.  Right sided system extraction with reimplantation of Abott BIV ICD on left side.  Evaluated incision sites.  All incision sites are covered by steristrips.  No bleeding, swelling or redness noted.  Pt states discomfort is located mid sternum, closer to the right side incision site.  She states she has also noticed increased SOB.  She denies SOB with inspiration or when lying flat.  She states discomfort is worse when laying on her left side.  Discussed with AT.  Will send Pt for STAT chest xray to evaluate ICD and leads and follow up based on results.

## 2023-11-13 NOTE — Telephone Encounter (Signed)
Results of chest Xray received and reviewed with Dr. Ladona Ridgel.  Device and leads are stable.  No changes noted.    Discussed Pt's diuretic regimen with Dr. Odis Hollingshead (primary cardiologist).  Per Dr. Odis Hollingshead, add metolazone 2.5 mg PO every other day prior to taking Bumex.   At follow up device clinic appointment schedule lab work: BMP, magnesium and NT-Pro BNP.  Call placed to Pt.  Advised of chest xray results.  Advised of medication change.  Pt states she now has itching around incision site.  Advised to apply cool compress and taking antihistamine as tolerated (no lotions near incision site).  Pt indicates understanding.  Also advised Pt to take Tylenol twice a day with her normal medications for discomfort.  Reassured Pt that the discomfort is normal at this time and it will continue to improve.  Will continue to monitor.

## 2023-11-13 NOTE — Patient Instructions (Signed)
You will get a chest Xray at [facility].  You will get a call with the results.

## 2023-11-18 ENCOUNTER — Telehealth (HOSPITAL_COMMUNITY): Payer: Self-pay | Admitting: Cardiology

## 2023-11-18 MED ORDER — ENTRESTO 24-26 MG PO TABS: 1.0000 | ORAL_TABLET | Freq: Two times a day (BID) | ORAL | 11 refills | Status: AC

## 2023-11-18 NOTE — Telephone Encounter (Signed)
Medication reconciliation complete for entresto Script updated

## 2023-11-21 ENCOUNTER — Ambulatory Visit: Payer: 59 | Attending: Cardiology

## 2023-11-21 DIAGNOSIS — R0602 Shortness of breath: Secondary | ICD-10-CM

## 2023-11-21 DIAGNOSIS — I5022 Chronic systolic (congestive) heart failure: Secondary | ICD-10-CM | POA: Diagnosis not present

## 2023-11-21 LAB — CUP PACEART INCLINIC DEVICE CHECK
Date Time Interrogation Session: 20250130132746
Implantable Lead Connection Status: 753985
Implantable Lead Connection Status: 753985
Implantable Lead Connection Status: 753985
Implantable Lead Implant Date: 20250116
Implantable Lead Implant Date: 20250116
Implantable Lead Implant Date: 20250116
Implantable Lead Location: 753859
Implantable Lead Location: 753860
Implantable Lead Location: 753860
Implantable Pulse Generator Implant Date: 20231103
Pulse Gen Serial Number: 5548670

## 2023-11-21 NOTE — Progress Notes (Signed)
Normal ICD wound check. Wound well healed. Thresholds, sensing, and impedances consistent with implant measurements with 3.5V safety margin/auto capture until 3 month visit. No episodes.  Reviewed arm restrictions to continue for 6 weeks total post op. Reviewed shock plan.  Pt enrolled in remote follow-up.

## 2023-11-21 NOTE — Patient Instructions (Signed)
You will get lab work downstairs at [facility]:  BMP, magnesium, and NT-ProBNP per Dr. Odis Hollingshead.    After Your ICD (Implantable Cardiac Defibrillator)    Monitor your defibrillator site for redness, swelling, and drainage. Call the device clinic at [phone] if you experience these symptoms or fever/chills.  Your incision was closed with Steri-strips or staples:  You may shower 7 days after your procedure and wash your incision with soap and water. Avoid lotions, ointments, or perfumes over your incision until it is well-healed.  You may use a hot tub or a pool after your wound check appointment if the incision is completely closed.  Do not lift, push or pull greater than 10 pounds with the affected arm until 6 weeks after your procedure. There are no other restrictions in arm movement after your wound check appointment.  Your ICD is designed to protect you from life threatening heart rhythms. Because of this, you may receive a shock.   1 shock with no symptoms:  Call the office during business hours. 1 shock with symptoms (chest pain, chest pressure, dizziness, lightheadedness, shortness of breath, overall feeling unwell):  Call 911. If you experience 2 or more shocks in 24 hours:  Call 911. If you receive a shock, you should not drive.  [place] DMV - no driving for 6 months if you receive appropriate therapy from your ICD.   ICD Alerts:  Some alerts are vibratory and others beep. These are NOT emergencies. Please call our office to let us know. If this occurs at night or on weekends, it can wait until the next business day. Send a remote transmission.  If your device is capable of reading fluid status (for heart failure), you will be offered monthly monitoring to review this with you.   Remote monitoring is used to monitor your ICD from home. This monitoring is scheduled every 91 days by our office. It allows [place] to keep an eye on the functioning of your device to ensure it is working  properly. You will routinely see your Electrophysiologist annually (more often if necessary).

## 2023-11-26 ENCOUNTER — Ambulatory Visit: Payer: 59

## 2023-11-27 ENCOUNTER — Telehealth: Payer: Self-pay | Admitting: Cardiology

## 2023-11-27 NOTE — Telephone Encounter (Signed)
 Pt c/o medication issue:  1. Name of Medication:   metolazone  (ZAROXOLYN ) 2.5 MG tablet    2. How are you currently taking this medication (dosage and times per day)?   Take 1 tablet (2.5 mg total) by mouth every other day. 30 minutes prior to taking your Bume    3. Are you having a reaction (difficulty breathing--STAT)? No  4. What is your medication issue? Pt states that she just started taking above medication on 1/31. She would like to know how long does she need to take it before having labs done as requested. Please advise

## 2023-11-27 NOTE — Telephone Encounter (Signed)
 Spoke with pt and she stated she had just started the Metolazone  on 1/31. Per the clinical support note on 1/22, pt was to have repeat labs at next clinic device check appt however that was last Thursday (1/30) and the pt hadn't started the medication yet. Pt advised to get labs drawn on 2/7. Pt verbalized understanding and had no further questions.

## 2023-11-29 DIAGNOSIS — I5022 Chronic systolic (congestive) heart failure: Secondary | ICD-10-CM | POA: Diagnosis not present

## 2023-11-29 DIAGNOSIS — R0602 Shortness of breath: Secondary | ICD-10-CM | POA: Diagnosis not present

## 2023-11-30 ENCOUNTER — Encounter: Payer: Self-pay | Admitting: Hematology and Oncology

## 2023-11-30 LAB — BASIC METABOLIC PANEL
BUN/Creatinine Ratio: 36 — ABNORMAL HIGH (ref 12–28)
BUN: 39 mg/dL — ABNORMAL HIGH (ref 8–27)
CO2: 24 mmol/L (ref 20–29)
Calcium: 10.5 mg/dL — ABNORMAL HIGH (ref 8.7–10.3)
Chloride: 92 mmol/L — ABNORMAL LOW (ref 96–106)
Creatinine, Ser: 1.07 mg/dL — ABNORMAL HIGH (ref 0.57–1.00)
Glucose: 147 mg/dL — ABNORMAL HIGH (ref 70–99)
Potassium: 3.3 mmol/L — ABNORMAL LOW (ref 3.5–5.2)
Sodium: 136 mmol/L (ref 134–144)
eGFR: 55 mL/min/{1.73_m2} — ABNORMAL LOW (ref >59)

## 2023-11-30 LAB — PRO B NATRIURETIC PEPTIDE: NT-Pro BNP: 194 pg/mL (ref 0–301)

## 2023-11-30 LAB — MAGNESIUM: Magnesium: 2.2 mg/dL (ref 1.6–2.3)

## 2023-12-02 ENCOUNTER — Other Ambulatory Visit: Payer: Self-pay

## 2023-12-02 MED ORDER — POTASSIUM CHLORIDE CRYS ER 20 MEQ PO TBCR: 40.0000 meq | EXTENDED_RELEASE_TABLET | Freq: Every day | ORAL | 1 refills | Status: DC

## 2023-12-03 ENCOUNTER — Other Ambulatory Visit: Payer: Self-pay

## 2023-12-06 ENCOUNTER — Other Ambulatory Visit: Payer: Self-pay | Admitting: Cardiology

## 2023-12-10 ENCOUNTER — Other Ambulatory Visit: Payer: Self-pay | Admitting: Cardiology

## 2023-12-14 DIAGNOSIS — H6992 Unspecified Eustachian tube disorder, left ear: Secondary | ICD-10-CM | POA: Diagnosis not present

## 2023-12-14 DIAGNOSIS — J0100 Acute maxillary sinusitis, unspecified: Secondary | ICD-10-CM | POA: Diagnosis not present

## 2023-12-14 DIAGNOSIS — R051 Acute cough: Secondary | ICD-10-CM | POA: Diagnosis not present

## 2023-12-14 DIAGNOSIS — R0602 Shortness of breath: Secondary | ICD-10-CM | POA: Diagnosis not present

## 2023-12-14 DIAGNOSIS — H9202 Otalgia, left ear: Secondary | ICD-10-CM | POA: Diagnosis not present

## 2023-12-14 DIAGNOSIS — R509 Fever, unspecified: Secondary | ICD-10-CM | POA: Diagnosis not present

## 2023-12-17 ENCOUNTER — Ambulatory Visit: Payer: 59 | Attending: Cardiology

## 2023-12-17 ENCOUNTER — Telehealth: Payer: Self-pay

## 2023-12-17 DIAGNOSIS — Z95810 Presence of automatic (implantable) cardiac defibrillator: Secondary | ICD-10-CM

## 2023-12-17 DIAGNOSIS — I5022 Chronic systolic (congestive) heart failure: Secondary | ICD-10-CM

## 2023-12-17 NOTE — Patient Instructions (Addendum)
 Check with your plastic surgeon to see if they think pain near incision site can be related to previous breast procedures-nerve pain-and see if they recommend a different pain medicine. Follow up with Dr. Ladona Ridgel on December 26, 2023 at 10:15 am at the [facility] office. OK to continue ice, tylenol and ibuprofen as needed for pain. Try wearing something supportive

## 2023-12-17 NOTE — Progress Notes (Signed)
 Pt seen as add on to device clinic to evaluate complaints of pain near base of ICD.  Site evaluated with Francis Dowse, PA and this nurse.  No swelling noted.  No bruising, redness, drainage or other s/s of infection.  Pt is s/p double mastectomy.  The ICD sits in a pocket of tissue that could cause the device to press into the Pt's scar tissue from previous surgeries.  Advised Pt that there was no concern for infection.  Advised Pt to reach out to plastic surgeon to determine if the pain she is experiencing could be nerve pain.  In the interim she will continue ice packs, tylenol and ibuprofen as needed for discomfort.  Also suggested Pt could wear some type of bra or wrap to try to provide additional support.  This may help with discomfort.    Follow up scheduled with Dr. Ladona Ridgel for March 6 to evaluate further.

## 2023-12-17 NOTE — Telephone Encounter (Signed)
 The pt states she is having some pain near her incision site. She been putting ice packs on it. [name] asked the pt to come into the office. She agreed to come at 2 pm.

## 2023-12-17 NOTE — Telephone Encounter (Signed)
 Spoke with family.  Pt is having increased pain at device site.  They do not believe site looks more swollen.  No s/s of infection.  Pt recently had the flu and has been coughing and sneezing.  Appt today at 2:00 pm to evaluate incision site.

## 2023-12-24 ENCOUNTER — Encounter: Payer: Self-pay | Admitting: *Deleted

## 2023-12-24 NOTE — Progress Notes (Signed)
 Receive a message from [facility] representative stating patient did not respond to their call to schedule mobile phlebotomy draw.  Representative states patient will need to reach out to [employer] directly at [phone] option #[phone] to schedule if patient wishes to proceed.

## 2023-12-26 ENCOUNTER — Encounter: Payer: Self-pay | Admitting: Internal Medicine

## 2023-12-26 ENCOUNTER — Ambulatory Visit: Payer: 59 | Attending: Internal Medicine | Admitting: Internal Medicine

## 2023-12-26 VITALS — BP 114/70 | HR 75 | Ht 67.5 in | Wt 182.8 lb

## 2023-12-26 DIAGNOSIS — I5022 Chronic systolic (congestive) heart failure: Secondary | ICD-10-CM

## 2023-12-26 NOTE — Progress Notes (Signed)
 HPI Ms. Robertshaw returns today for followup. She is a pleasant 73 yo woman with bilateral breast CA, non-ischemic CM, chronic systolic heart failure, class 2, and LBBB. She underwent biv ICD insertion from the right side (she had previously just had left breast surgery). Her CS was quite toruous and her veins diminutive and we opted to placed a left bundle area lead. Her initial ECG post op was narrow but subsequent showed septal capture and pacing induced LBBB. She has undergone removal of her old system and insertion of a left sided system. Her QRS decreased from 170 to 130 with left bundle area pacing. She feels better. She has had some soreness at her left sided incision but no infectious symptoms. Allergies  Allergen Reactions   Bactrim [Sulfamethoxazole-Trimethoprim] Shortness Of Breath    Shortness of breath, difficulty breathing, headache, rash, fatigue   Aspirin Palpitations and Other (See Comments)    Heart fluttering, pt takes 40.5 mg (half of 81 mg) daily Other reaction(s): bradycardia, Not available   Sulfa Antibiotics Hives   Tape Rash    Tears skin off     Current Outpatient Medications  Medication Sig Dispense Refill   albuterol (VENTOLIN HFA) 108 (90 Base) MCG/ACT inhaler Inhale 2 puffs into the lungs every 6 (six) hours as needed for wheezing or shortness of breath.     atorvastatin (LIPITOR) 20 MG tablet Take 20 mg by mouth daily.     baclofen (LIORESAL) 10 MG tablet Take 10 mg by mouth daily as needed for muscle spasms.     bumetanide (BUMEX) 1 MG tablet TAKE 1 TABLET BY MOUTH EVERY DAY 90 tablet 1   cetirizine (ZYRTEC) 10 MG tablet Take 10 mg by mouth in the morning.     Cholecalciferol (VITAMIN D3) 25 MCG (1000 UT) CAPS Take 1,000 Units by mouth in the morning.     cyanocobalamin (VITAMIN B12) 1000 MCG tablet Take 1,000 mcg by mouth daily.     dapagliflozin propanediol (FARXIGA) 10 MG TABS tablet TAKE 1 TABLET BY MOUTH EVERY DAY BEFORE BREAKFAST 90 tablet 3    diclofenac Sodium (VOLTAREN) 1 % GEL Apply 1 Application topically 4 (four) times daily as needed (pain).     exemestane (AROMASIN) 25 MG tablet TAKE 1 TABLET (25 MG TOTAL) BY MOUTH DAILY AFTER BREAKFAST. 90 tablet 3   fluticasone (FLONASE) 50 MCG/ACT nasal spray Place 1 spray into both nostrils in the morning.     GLUCOSAMINE-CHONDROITIN PO Take 1 tablet by mouth every evening.      glucose blood (ONETOUCH VERIO) test strip TEST ONCE DAILY AS DIRECTED 90     ibuprofen (ADVIL) 200 MG tablet Take 400 mg by mouth every 6 (six) hours as needed for mild pain or moderate pain.     metFORMIN (GLUCOPHAGE-XR) 500 MG 24 hr tablet Take 500 mg by mouth 2 (two) times daily.     metolazone (ZAROXOLYN) 2.5 MG tablet Take 1 tablet (2.5 mg total) by mouth every other day. 30 minutes prior to taking your Bumex 90 tablet 3   metoprolol succinate (TOPROL-XL) 25 MG 24 hr tablet TAKE 1 TABLET (25 MG TOTAL) BY MOUTH IN THE MORNING. HOLD IF SYSTOLIC BLOOD PRESSURE (TOP BLOOD PRESSURE NUMBER) LESS THAN 100 MMHG OR HEART RATE LESS THAN 60 BPM (PULSE). 90 tablet 3   Multiple Vitamin (MULTIVITAMIN WITH MINERALS) TABS tablet Take 1 tablet by mouth daily. Complete     nitroGLYCERIN (NITROSTAT) 0.4 MG SL tablet Place 0.4 mg  under the tongue every 5 (five) minutes as needed for chest pain.     Nutritional Supplements (ECHINEX PO) Take 1 tablet by mouth daily.     Olopatadine HCl 0.2 % SOLN Place 1 drop into both eyes daily as needed (allergy). Pataday     ondansetron (ZOFRAN) 4 MG tablet Take 1 tablet (4 mg total) by mouth every 6 (six) hours as needed for nausea or vomiting. 20 tablet 0   OneTouch Delica Lancets 33G MISC Apply topically.     pantoprazole (PROTONIX) 40 MG tablet Take 40 mg by mouth daily as needed (Stomach problem).     potassium chloride SA (KLOR-CON M) 20 MEQ tablet TAKE 2 TABLETS BY MOUTH DAILY 180 tablet 2   Probiotic Product (PROBIOTIC DAILY PO) Take 1 capsule by mouth in the morning.     Propylene Glycol  (SYSTANE BALANCE OP) Place 1 drop into both eyes 2 (two) times daily.     rosuvastatin (CRESTOR) 20 MG tablet Take 20 mg by mouth at bedtime.     sacubitril-valsartan (ENTRESTO) 24-26 MG Take 1 tablet by mouth 2 (two) times daily. 60 tablet 11   sertraline (ZOLOFT) 50 MG tablet Take 50 mg by mouth in the morning.     spironolactone (ALDACTONE) 25 MG tablet TAKE 1 TABLET BY MOUTH EVERY DAY IN THE MORNING 90 tablet 3   ZINC OXIDE-VITAMIN C PO Take 1 tablet by mouth daily. With vit D     No current facility-administered medications for this visit.     Past Medical History:  Diagnosis Date   AICD (automatic cardioverter/defibrillator) present    Anemia    Anginal pain (HCC)    Anxiety    Arthritis    Lumbar spine DDD.   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   Diabetes mellitus without complication (HCC)    Type II   Dyspnea    When patient gets sick uses Pro-Air inhaler   Frequent sinus infections    GERD (gastroesophageal reflux disease)    occ   Goiter    History of cardiomegaly    History of cataract    Bilateral   History of kidney stones    08/28/2018 currently has a large kidney stone, asymptomatic at this time   History of migraine    History of uterine fibroid    History of uterine prolapse    Hyperlipidemia    Hypertension    LBBB (left bundle branch block)    Menopause 03/03/2012   Nasal turbinate hypertrophy 11/29/2016   Left inferior    Personal history of chemotherapy    Personal history of radiation therapy    Pneumonia    as a child   PONV (postoperative nausea and vomiting)    Sinusitis 2014   Sleep apnea    No CPAP   SUI (stress urinary incontinence, female)    SVD (spontaneous vaginal delivery)    x 2   Thyroid nodule     ROS:   All systems reviewed and negative  except as noted in the HPI.   Past Surgical History:  Procedure Laterality Date   ABDOMINAL HYSTERECTOMY     BILATERAL SALPINGOOPHORECTOMY Bilateral    BIV ICD INSERTION CRT-D N/A 08/24/2022   Procedure: BIV ICD INSERTION CRT-D;  Surgeon: Lewayne Bunting  W, MD;  Location: [facility] IN[facility];  Service: Cardiovascular;  Laterality: N/A;   BIV ICD INSERTION CRT-D N/A 11/07/2023   Procedure: BIV ICD INSERTION CRT-D;  Surgeon: Marinus Maw, MD;  Location: [facility] INVASIVE CV LAB;  Service: Cardiovascular;  Laterality: N/A;   BLADDER SUSPENSION N/A 05/16/2017   Procedure: TRANSVAGINAL TAPE (TVT) PROCEDURE;  Surgeon: Osborn Coho, MD;  Location: [facility] ORS;  Service: Gynecology;  Laterality: N/A;   BREAST BIOPSY Right 2009   BREAST IMPLANT REMOVAL Bilateral 02/05/2022   Procedure: Removal bilateral breast implants;  Surgeon: Allena Napoleon, MD;  Location: [facility] OR;  Service: Plastics;  Laterality: Bilateral;   BREAST LUMPECTOMY Right 2009   BREAST LUMPECTOMY Left 2006   BREAST RECONSTRUCTION WITH PLACEMENT OF TISSUE EXPANDER AND FLEX HD (ACELLULAR HYDRATED DERMIS) Bilateral 09/04/2021   Procedure: BILATERAL BREAST RECONSTRUCTION WITH PLACEMENT OF TISSUE EXPANDER AND FLEX HD (ACELLULAR HYDRATED DERMIS);  Surgeon: Allena Napoleon, MD;  Location: [facility] OR;  Service: Plastics;  Laterality: Bilateral;   CARDIAC CATHETERIZATION     CATARACT EXTRACTION Left 10/23/2017   CATARACT EXTRACTION Right 11/06/2017   COLONOSCOPY  10/22/2009   Normal.  Repeat 5 years.  [name]   COLONOSCOPY WITH PROPOFOL N/A 12/21/2022   Procedure: COLONOSCOPY WITH PROPOFOL;  Surgeon: Jeani Hawking, MD;  Location: [facility];  Service: Gastroenterology;  Laterality: N/A;   CYSTOSCOPY N/A 05/16/2017   Procedure: CYSTOSCOPY;  Surgeon: Osborn Coho, MD;  Location: [facility] OR[facility];  Service: Gynecology;  Laterality: N/A;   DEBRIDEMENT AND CLOSURE WOUND Bilateral 10/03/2021   Procedure: Debridement bilateral mastectomy flaps;  Surgeon: Allena Napoleon, MD;  Location: [facility] OR;  Service: Plastics;  Laterality: Bilateral;  1.5 hour   EYE SURGERY     bilateral cataracts   LEAD EXTRACTION N/A 11/07/2023   Procedure: LEAD EXTRACTION;  Surgeon: Marinus Maw, MD;  Location: [facility];  Service: Cardiovascular;  Laterality: N/A;   LEFT HEART CATH AND CORONARY ANGIOGRAPHY N/A 02/09/2020   Procedure: LEFT HEART CATH AND CORONARY ANGIOGRAPHY;  Surgeon: Elder Negus, MD;  Location: [facility] IN[facility];  Service: Cardiovascular;  Laterality: N/A;   LEFT HEART CATH AND CORONARY ANGIOGRAPHY N/A 03/27/2022   Procedure: LEFT HEART CATH AND CORONARY ANGIOGRAPHY;  Surgeon: Yates Decamp, MD;  Location: [facility];  Service: Cardiovascular;  Laterality: N/A;   MASS EXCISION Right 12/04/2022   Procedure: EXCISION OF RIGHT CHEST WALL MASS;  Surgeon: Almond Lint, MD;  Location: [facility] OR;  Service: General;  Laterality: Right;   MASTECTOMY W/ SENTINEL NODE BIOPSY Right 09/04/2021   Procedure: BILATERAL MASTECTOMIES WITH RIGHT SENTINEL LYMPH NODE BIOPSY;  Surgeon: Almond Lint, MD;  Location: [facility] OR;  Service: General;  Laterality: Right;   REMOVAL OF BILATERAL TISSUE EXPANDERS WITH PLACEMENT OF BILATERAL BREAST IMPLANTS Bilateral 10/03/2021   Procedure: REMOVAL OF BILATERAL TISSUE EXPANDERS WITH REPLACEMENT OF TISSUE EXPANDERS;  Surgeon: Allena Napoleon, MD;  Location: [facility] OR;  Service: Plastics;  Laterality: Bilateral;   REMOVAL OF BILATERAL TISSUE EXPANDERS WITH PLACEMENT OF BILATERAL BREAST IMPLANTS Bilateral 12/19/2021   Procedure: REMOVAL OF BILATERAL TISSUE EXPANDERS WITH PLACEMENT OF BILATERAL BREAST IMPLANTS;  Surgeon: Allena Napoleon, MD;  Location: [facility] OR;  Service: Plastics;  Laterality: Bilateral;   ROBOTIC ASSISTED TOTAL HYSTERECTOMY N/A 05/16/2017   Procedure: ROBOTIC ASSISTED TOTAL HYSTERECTOMY;  Surgeon: Silverio Lay, MD;  Location: [facility];  Service: Gynecology;  Laterality: N/A;   ROTATOR CUFF REPAIR Bilateral    SCAR REVISION  N/A  12/04/2022   Procedure: REVISION OF MASTECTOMY SCAR;  Surgeon: Almond Lint, MD;  Location: [facility] OR;  Service: General;  Laterality: N/A;   SHOULDER ARTHROSCOPY WITH ROTATOR CUFF REPAIR AND SUBACROMIAL DECOMPRESSION Right 09/03/2018   Procedure: Right shoulder manipulation under anesthesia, exam under anesthesia, mini open rotator cuff repair, subacromial decompression;  Surgeon: Jene Every, MD;  Location: [facility];  Service: Orthopedics;  Laterality: Right;  90 mins   TRANSESOPHAGEAL ECHOCARDIOGRAM (CATH LAB) N/A 11/07/2023   Procedure: TRANSESOPHAGEAL ECHOCARDIOGRAM;  Surgeon: Marinus Maw, MD;  Location: [facility] INVASIVE CV LAB;  Service: Cardiovascular;  Laterality: N/A;   TUBAL LIGATION     WISDOM TOOTH EXTRACTION       Family History  Problem Relation Age of Onset   Cancer Mother 63       breast cancer   Hypertension Mother    Diabetes Father    Cancer Sister        breast cancer   Breast cancer Sister    Cancer Sister        breast cancer   Breast cancer Sister      Social History   Socioeconomic History   Marital status: Divorced    Spouse name: n/a   Number of children: 2   Years of education: 12   Highest education level: Not on file  Occupational History   Occupation: Retired    Associate Professor: [employer]    Comment: Diplomatic Services operational officer  Tobacco Use   Smoking status: Never   Smokeless tobacco: Never  Vaping Use   Vaping status: Never Used  Substance and Sexual Activity   Alcohol use: No   Drug use: No   Sexual activity: Not Currently    Partners: Male    Birth control/protection: Surgical, Post-menopausal    Comment: Hysterectomy  Other Topics Concern   Not on file  Social History Narrative   Marital status: divorced; dating seriously x many years.       Children:  2 children; 1 grandchild.      Employment: Research officer, political party.      Lives: alone      Tobacco: none       Alcohol: none      Exercise:  Walking with sister two days per week.      Seatbelt:  100%      Guns: none      Right-handed   Caffeine: occasional coffee, hot tea or green tea a few times per week   Social Drivers of Health   Financial Resource Strain: Not on file  Food Insecurity: No Food Insecurity (11/07/2023)   Hunger Vital Sign    Worried About Running Out of Food in the Last Year: Never true    Ran Out of Food in the Last Year: Never true  Transportation Needs: No Transportation Needs (11/07/2023)   PRAPARE - Administrator, Civil Service (Medical): No    Lack of Transportation (Non-Medical): No  Physical Activity: Not on file  Stress: Not on file  Social Connections: Not on file  Intimate Partner Violence: Not At Risk (11/07/2023)   Humiliation, Afraid, Rape, and Kick questionnaire    Fear of Current or Ex-Partner: No    Emotionally Abused: No    Physically Abused: No    Sexually Abused: No     Ht 5' 7.5" (1.715 m)   Wt 182 lb 12.8 oz (82.9 kg)   BMI 28.21 kg/m   Physical Exam:  Well appearing NAD  HEENT: Unremarkable Neck:  No JVD, no thyromegally Lymphatics:  No adenopathy Back:  No CVA tenderness Lungs:  Clear; well healed PM incisions bilaterally. HEART:  Regular rate rhythm, no murmurs, no rubs, no clicks Abd:  soft, positive bowel sounds, no organomegally, no rebound, no guarding Ext:  2 plus pulses, no edema, no cyanosis, no clubbing Skin:  No rashes no nodules Neuro:  CN II through XII intact, motor grossly intact  DEVICE  Normal device function.  See PaceArt for details.   Assess/Plan:  Chronic systolic heart failure - her symptoms are class 2A, down from 3B. She will continue her current meds and I have encouraged regular exercise. ICD - her biv ICD is programmed DDD and she is maintaining NSR. Breast CA - she is s/p bilateral mastectomy. She appears to have finally healed her incisions.     Sharlot Gowda Chloe Flis,MD

## 2023-12-26 NOTE — Patient Instructions (Signed)
 Medication Instructions:  Your physician recommends that you continue on your current medications as directed. Please refer to the Current Medication list given to you today.  *If you need a refill on your cardiac medications before your next appointment, please call your pharmacy*  Lab Work: None ordered.  You may go to any [facility] Location for your lab work:  [facility] - [address] ([facility]) - [address] [phone] [address]  [place] - [address]   [place]  - [address]   [place] - [address] - [address] [employer]  - [address] [place] - [address] ([facility]   If you have labs (blood work) drawn today and your tests are completely normal, you will receive your results only by: [employer] (if you have MyChart)  If you have any lab test that is abnormal or we need to change your treatment, we will call you or send a MyChart message to review the results.  Testing/Procedures: None ordered.  Follow-Up: At [facility], you and your health needs are our priority.  As part of our continuing mission to provide you with exceptional heart care, we have created designated Provider Care Teams.  These Care Teams include your primary Cardiologist (physician) and Advanced Practice Providers (APPs -  Physician Assistants and Nurse Practitioners) who all work together to provide you with the care you need, when you need it.  Your next appointment:   1 year(s)  The format for your next appointment:   In Person  Provider:   Lewayne Bunting, MD{or one of the following Advanced Practice Providers on your designated Care Team:   Francis Dowse, [place] Casimiro Needle "Mardelle Matte" [place], [place] [name], NP  Note: Remote monitoring is used to monitor your Pacemaker/ ICD from home. This monitoring reduces the number of office visits required to check your  device to one time per year. It allows [place] to keep an eye on the functioning of your device to ensure it is working properly.            Valet parking services will be available as well.

## 2023-12-27 DIAGNOSIS — Z171 Estrogen receptor negative status [ER-]: Secondary | ICD-10-CM | POA: Diagnosis not present

## 2023-12-27 DIAGNOSIS — C50412 Malignant neoplasm of upper-outer quadrant of left female breast: Secondary | ICD-10-CM | POA: Diagnosis not present

## 2023-12-31 ENCOUNTER — Other Ambulatory Visit: Payer: Self-pay | Admitting: Cardiology

## 2024-01-01 DIAGNOSIS — E059 Thyrotoxicosis, 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: Secondary | ICD-10-CM | POA: Diagnosis not present

## 2024-01-01 DIAGNOSIS — I10 Essential (primary) hypertension: Secondary | ICD-10-CM | POA: Diagnosis not present

## 2024-01-01 DIAGNOSIS — E1165 Type 2 diabetes mellitus with hyperglycemia: Secondary | ICD-10-CM | POA: Diagnosis not present

## 2024-01-09 ENCOUNTER — Telehealth: Payer: Self-pay | Admitting: *Deleted

## 2024-01-09 NOTE — Telephone Encounter (Signed)
Per MD request, RN placed call to pt regarding negative (Not Detected) recent Signatera testing.  Pt appreciative of call and verbalized understanding.  

## 2024-01-10 ENCOUNTER — Encounter: Payer: Self-pay | Admitting: Hematology and Oncology

## 2024-01-24 ENCOUNTER — Telehealth: Payer: Self-pay | Admitting: Internal Medicine

## 2024-01-24 ENCOUNTER — Ambulatory Visit: Attending: Internal Medicine

## 2024-01-24 DIAGNOSIS — I428 Other cardiomyopathies: Secondary | ICD-10-CM

## 2024-01-24 NOTE — Telephone Encounter (Signed)
 Pt would like a c/b regarding procedure site. Pt states that site is swollen and painful. Please advise

## 2024-01-24 NOTE — Progress Notes (Signed)
 Patient seen in device clinic for pain at incision site. Site appears well healed free of redness, swelling or drainage. Patient Dr. Lalla Brothers in to see patient and advised site is still healing since gen change and continue to use left arm to prevent rom and use tylenol/heat/ice to help reduce pain. Patient voiced understanding.

## 2024-01-24 NOTE — Telephone Encounter (Signed)
 Spoke with patient, states she has had increased pain at her incision site from her lead extraction on 11/07/23. Patient states the pain woke her up from a sleep last night, this morning she noticed swelling and that the site is slightly pink. Patient denies drainage or warmth to touch. Pain has improved some with ice and ibuprofen. Will forward to device team for advisement or to possibly schedule a time for a wound check

## 2024-01-24 NOTE — Patient Instructions (Signed)
   After Your ICD (Implantable Cardiac Defibrillator)    Monitor your defibrillator site for redness, swelling, and drainage. Call the device clinic at [phone] if you experience these symptoms or fever/chills.   

## 2024-01-24 NOTE — Telephone Encounter (Signed)
 Spoke to patient. Device clinic apt made today at 10:00. Pt aware of location, date and time.

## 2024-01-29 DIAGNOSIS — E042 Nontoxic multinodular goiter: Secondary | ICD-10-CM | POA: Diagnosis not present

## 2024-01-29 DIAGNOSIS — E059 Thyrotoxicosis, 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: Secondary | ICD-10-CM | POA: Diagnosis not present

## 2024-01-29 DIAGNOSIS — E118 Type 2 diabetes mellitus with unspecified complications: Secondary | ICD-10-CM | POA: Diagnosis not present

## 2024-01-29 DIAGNOSIS — I10 Essential (primary) hypertension: Secondary | ICD-10-CM | POA: Diagnosis not present

## 2024-02-10 LAB — ECHO INTRAOPERATIVE TEE
Height: 67 in
Weight: 2960 [oz_av]

## 2024-02-11 ENCOUNTER — Ambulatory Visit: Payer: 59 | Attending: Internal Medicine | Admitting: Internal Medicine

## 2024-02-11 ENCOUNTER — Ambulatory Visit: Payer: 59

## 2024-02-11 ENCOUNTER — Encounter: Payer: Self-pay | Admitting: Internal Medicine

## 2024-02-11 VITALS — BP 114/64 | HR 83 | Ht 67.5 in | Wt 180.4 lb

## 2024-02-11 DIAGNOSIS — I5022 Chronic systolic (congestive) heart failure: Secondary | ICD-10-CM

## 2024-02-11 DIAGNOSIS — I428 Other cardiomyopathies: Secondary | ICD-10-CM

## 2024-02-11 LAB — CUP PACEART INCLINIC DEVICE CHECK
Brady Statistic RA Percent Paced: 6.2 %
Brady Statistic RV Percent Paced: 99 %
Date Time Interrogation Session: 20250422153737
Implantable Lead Connection Status: 753985
Implantable Lead Connection Status: 753985
Implantable Lead Connection Status: 753985
Implantable Lead Implant Date: 20250116
Implantable Lead Implant Date: 20250116
Implantable Lead Implant Date: 20250116
Implantable Lead Location: 753859
Implantable Lead Location: 753860
Implantable Lead Location: 753860
Implantable Pulse Generator Implant Date: 20231103
Pulse Gen Serial Number: 5548670

## 2024-02-11 NOTE — Progress Notes (Signed)
 HPI Jordan Willis returns today for followup. She is a pleasant 73 yo woman with bilateral breast CA, non-ischemic CM, chronic systolic heart failure, class 2, and LBBB. She underwent biv ICD insertion from the right side (she had previously just had left breast surgery). Her CS was quite toruous and her veins diminutive and we opted to placed a left bundle area lead. Her initial ECG post op was narrow but subsequent showed septal capture and pacing induced LBBB. She has undergone removal of her old system and insertion of a left sided system. Her QRS decreased from 170 to 130 with left bundle area pacing. She feels better. She has had some soreness at her left sided incision but no infectious symptoms.  Allergies  Allergen Reactions   Bactrim  [Sulfamethoxazole -Trimethoprim ] Shortness Of Breath    Shortness of breath, difficulty breathing, headache, rash, fatigue   Aspirin  Palpitations and Other (See Comments)    Heart fluttering, pt takes 40.5 mg (half of 81 mg) daily Other reaction(s): bradycardia, Not available   Sulfa  Antibiotics Hives   Tape Rash    Tears skin off     Current Outpatient Medications  Medication Sig Dispense Refill   albuterol  (VENTOLIN  HFA) 108 (90 Base) MCG/ACT inhaler Inhale 2 puffs into the lungs every 6 (six) hours as needed for wheezing or shortness of breath.     ALOMIDE 0.1 % ophthalmic solution SMARTSIG:1-2 Drop(s) In Eye(s) 4 Times Daily     baclofen (LIORESAL) 10 MG tablet Take 10 mg by mouth daily as needed for muscle spasms.     bumetanide  (BUMEX ) 1 MG tablet TAKE 1 TABLET BY MOUTH EVERY DAY 90 tablet 2   cetirizine (ZYRTEC) 10 MG tablet Take 10 mg by mouth in the morning.     Cholecalciferol (VITAMIN D3) 25 MCG (1000 UT) CAPS Take 1,000 Units by mouth in the morning.     cyanocobalamin (VITAMIN B12) 1000 MCG tablet Take 1,000 mcg by mouth daily.     dapagliflozin  propanediol (FARXIGA ) 10 MG TABS tablet TAKE 1 TABLET BY MOUTH EVERY DAY BEFORE BREAKFAST 90  tablet 3   diclofenac Sodium (VOLTAREN) 1 % GEL Apply 1 Application topically 4 (four) times daily as needed (pain).     exemestane  (AROMASIN ) 25 MG tablet TAKE 1 TABLET (25 MG TOTAL) BY MOUTH DAILY AFTER BREAKFAST. 90 tablet 3   fluticasone  (FLONASE ) 50 MCG/ACT nasal spray Place 1 spray into both nostrils in the morning.     GLUCOSAMINE-CHONDROITIN PO Take 1 tablet by mouth every evening.      glucose blood (ONETOUCH VERIO) test strip TEST ONCE DAILY AS DIRECTED 90     ibuprofen  (ADVIL ) 200 MG tablet Take 400 mg by mouth every 6 (six) hours as needed for mild pain or moderate pain.     metFORMIN  (GLUCOPHAGE -XR) 500 MG 24 hr tablet Take 500 mg by mouth 2 (two) times daily.     metolazone  (ZAROXOLYN ) 2.5 MG tablet Take 1 tablet (2.5 mg total) by mouth every other day. 30 minutes prior to taking your Bumex  90 tablet 3   metoprolol  succinate (TOPROL -XL) 25 MG 24 hr tablet TAKE 1 TABLET (25 MG TOTAL) BY MOUTH IN THE MORNING. HOLD IF SYSTOLIC BLOOD PRESSURE (TOP BLOOD PRESSURE NUMBER) LESS THAN 100 MMHG OR HEART RATE LESS THAN 60 BPM (PULSE). 90 tablet 3   Multiple Vitamin (MULTIVITAMIN WITH MINERALS) TABS tablet Take 1 tablet by mouth daily. Complete     nitroGLYCERIN  (NITROSTAT ) 0.4 MG SL tablet Place  0.4 mg under the tongue every 5 (five) minutes as needed for chest pain.     Nutritional Supplements (ECHINEX PO) Take 1 tablet by mouth daily.     Olopatadine  HCl 0.2 % SOLN Place 1 drop into both eyes daily as needed (allergy). Pataday      ondansetron  (ZOFRAN ) 4 MG tablet Take 1 tablet (4 mg total) by mouth every 6 (six) hours as needed for nausea or vomiting. 20 tablet 0   ondansetron  (ZOFRAN -ODT) 4 MG disintegrating tablet Take 4 mg by mouth every 8 (eight) hours as needed.     OneTouch Delica Lancets 33G MISC Apply topically.     pantoprazole (PROTONIX) 40 MG tablet Take 40 mg by mouth daily as needed (Stomach problem).     potassium chloride  SA (KLOR-CON  M) 20 MEQ tablet TAKE 2 TABLETS BY MOUTH  DAILY 180 tablet 2   Probiotic Product (PROBIOTIC DAILY PO) Take 1 capsule by mouth in the morning.     Propylene Glycol (SYSTANE BALANCE OP) Place 1 drop into both eyes 2 (two) times daily.     rosuvastatin (CRESTOR) 20 MG tablet Take 20 mg by mouth at bedtime.     sacubitril -valsartan  (ENTRESTO ) 24-26 MG Take 1 tablet by mouth 2 (two) times daily. 60 tablet 11   sertraline  (ZOLOFT ) 50 MG tablet Take 50 mg by mouth in the morning.     spironolactone  (ALDACTONE ) 25 MG tablet TAKE 1 TABLET BY MOUTH EVERY DAY IN THE MORNING 90 tablet 3   ZINC OXIDE-VITAMIN C PO Take 1 tablet by mouth daily. With vit D     No current facility-administered medications for this visit.     Past Medical History:  Diagnosis Date   AICD (automatic cardioverter/defibrillator) present    Anemia    Anginal pain (HCC)    Anxiety    Arthritis    Lumbar spine DDD.   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   Diabetes mellitus without complication (HCC)    Type II   Dyspnea    When patient gets sick uses Pro-Air inhaler   Frequent sinus infections    GERD (gastroesophageal reflux disease)    occ   Goiter    History of cardiomegaly    History of cataract    Bilateral   History of kidney stones    08/28/2018 currently has a large kidney stone, asymptomatic at this time   History of migraine    History of uterine fibroid    History of uterine prolapse    Hyperlipidemia    Hypertension    LBBB (left bundle branch block)    Menopause 03/03/2012   Nasal turbinate hypertrophy 11/29/2016   Left inferior    Personal history of chemotherapy    Personal history of radiation therapy    Pneumonia    as a child   PONV (postoperative nausea and vomiting)    Sinusitis 2014   Sleep apnea    No CPAP   SUI (stress urinary incontinence,  female)    SVD (spontaneous vaginal delivery)    x 2   Thyroid  nodule     ROS:   All systems reviewed and negative except as noted in the HPI.   Past Surgical History:  Procedure Laterality Date   ABDOMINAL HYSTERECTOMY  BILATERAL SALPINGOOPHORECTOMY Bilateral    BIV ICD INSERTION CRT-D N/A 08/24/2022   Procedure: BIV ICD INSERTION CRT-D;  Surgeon: Tammie Fall, MD;  Location: [facility] INVASIVE CV LAB;  Service: Cardiovascular;  Laterality: N/A;   BIV ICD INSERTION CRT-D N/A 11/07/2023   Procedure: BIV ICD INSERTION CRT-D;  Surgeon: Tammie Fall, MD;  Location: [facility] INVASIVE CV LAB;  Service: Cardiovascular;  Laterality: N/A;   BLADDER SUSPENSION N/A 05/16/2017   Procedure: TRANSVAGINAL TAPE (TVT) PROCEDURE;  Surgeon: Renea Carrion, MD;  Location: [facility] ORS;  Service: Gynecology;  Laterality: N/A;   BREAST BIOPSY Right 2009   BREAST IMPLANT REMOVAL Bilateral 02/05/2022   Procedure: Removal bilateral breast implants;  Surgeon: Barb Bonito, MD;  Location: [facility] OR;  Service: Plastics;  Laterality: Bilateral;   BREAST LUMPECTOMY Right 2009   BREAST LUMPECTOMY Left 2006   BREAST RECONSTRUCTION WITH PLACEMENT OF TISSUE EXPANDER AND FLEX HD (ACELLULAR HYDRATED DERMIS) Bilateral 09/04/2021   Procedure: BILATERAL BREAST RECONSTRUCTION WITH PLACEMENT OF TISSUE EXPANDER AND FLEX HD (ACELLULAR HYDRATED DERMIS);  Surgeon: Barb Bonito, MD;  Location: [facility] OR;  Service: Plastics;  Laterality: Bilateral;   CARDIAC CATHETERIZATION     CATARACT EXTRACTION Left 10/23/2017   CATARACT EXTRACTION Right 11/06/2017   COLONOSCOPY  10/22/2009   Normal.  Repeat 5 years.  [name]   COLONOSCOPY WITH PROPOFOL  N/A 12/21/2022   Procedure: COLONOSCOPY WITH PROPOFOL ;  Surgeon: Alvis Jourdain, MD;  Location: [facility];  Service: Gastroenterology;  Laterality: N/A;   CYSTOSCOPY N/A 05/16/2017   Procedure: CYSTOSCOPY;  Surgeon: Renea Carrion, MD;  Location: [facility] ORS;  Service: Gynecology;  Laterality: N/A;    DEBRIDEMENT AND CLOSURE WOUND Bilateral 10/03/2021   Procedure: Debridement bilateral mastectomy flaps;  Surgeon: Barb Bonito, MD;  Location: [facility] OR;  Service: Plastics;  Laterality: Bilateral;  1.5 hour   EYE SURGERY     bilateral cataracts   LEAD EXTRACTION N/A 11/07/2023   Procedure: LEAD EXTRACTION;  Surgeon: Tammie Fall, MD;  Location: [facility] INVASIVE CV LAB;  Service: Cardiovascular;  Laterality: N/A;   LEFT HEART CATH AND CORONARY ANGIOGRAPHY N/A 02/09/2020   Procedure: LEFT HEART CATH AND CORONARY ANGIOGRAPHY;  Surgeon: Cody Das, MD;  Location: [facility] INVASIVE CV LAB;  Service: Cardiovascular;  Laterality: N/A;   LEFT HEART CATH AND CORONARY ANGIOGRAPHY N/A 03/27/2022   Procedure: LEFT HEART CATH AND CORONARY ANGIOGRAPHY;  Surgeon: Knox Perl, MD;  Location: [facility] INVASIVE CV LAB;  Service: Cardiovascular;  Laterality: N/A;   MASS EXCISION Right 12/04/2022   Procedure: EXCISION OF RIGHT CHEST WALL MASS;  Surgeon: Lockie Rima, MD;  Location: [facility] OR;  Service: General;  Laterality: Right;   MASTECTOMY W/ SENTINEL NODE BIOPSY Right 09/04/2021   Procedure: BILATERAL MASTECTOMIES WITH RIGHT SENTINEL LYMPH NODE BIOPSY;  Surgeon: Lockie Rima, MD;  Location: [facility] OR;  Service: General;  Laterality: Right;   REMOVAL OF BILATERAL TISSUE EXPANDERS WITH PLACEMENT OF BILATERAL BREAST IMPLANTS Bilateral 10/03/2021   Procedure: REMOVAL OF BILATERAL TISSUE EXPANDERS WITH REPLACEMENT OF TISSUE EXPANDERS;  Surgeon: Barb Bonito, MD;  Location: [facility] OR;  Service: Plastics;  Laterality: Bilateral;   REMOVAL OF BILATERAL TISSUE EXPANDERS WITH PLACEMENT OF BILATERAL BREAST IMPLANTS Bilateral 12/19/2021   Procedure: REMOVAL OF BILATERAL TISSUE EXPANDERS WITH PLACEMENT OF BILATERAL BREAST IMPLANTS;  Surgeon: Barb Bonito, MD;  Location: [facility] OR;  Service: Plastics;  Laterality: Bilateral;   ROBOTIC ASSISTED TOTAL HYSTERECTOMY N/A 05/16/2017   Procedure: ROBOTIC ASSISTED TOTAL HYSTERECTOMY;  Surgeon: Duke Gibbons,  Adell Hones, MD;  Location: [facility] ORS;  Service: Gynecology;  Laterality: N/A;   ROTATOR CUFF REPAIR Bilateral    SCAR REVISION N/A 12/04/2022   Procedure: REVISION OF MASTECTOMY SCAR;  Surgeon: Lockie Rima, MD;  Location: [facility] OR;  Service: General;  Laterality: N/A;   SHOULDER ARTHROSCOPY WITH ROTATOR CUFF REPAIR AND SUBACROMIAL DECOMPRESSION Right 09/03/2018   Procedure: Right shoulder manipulation under anesthesia, exam under anesthesia, mini open rotator cuff repair, subacromial decompression;  Surgeon: Orvan Blanch, MD;  Location: [facility] ORS;  Service: Orthopedics;  Laterality: Right;  90 mins   TRANSESOPHAGEAL ECHOCARDIOGRAM (CATH LAB) N/A 11/07/2023   Procedure: TRANSESOPHAGEAL ECHOCARDIOGRAM;  Surgeon: Tammie Fall, MD;  Location: [facility] INVASIVE CV LAB;  Service: Cardiovascular;  Laterality: N/A;   TUBAL LIGATION     WISDOM TOOTH EXTRACTION       Family History  Problem Relation Age of Onset   Cancer Mother 19       breast cancer   Hypertension Mother    Diabetes Father    Cancer Sister        breast cancer   Breast cancer Sister    Cancer Sister        breast cancer   Breast cancer Sister      Social History   Socioeconomic History   Marital status: Divorced    Spouse name: n/a   Number of children: 2   Years of education: 12   Highest education level: Not on file  Occupational History   Occupation: Retired    Associate Professor: [employer]    Comment: Diplomatic Services operational officer  Tobacco Use   Smoking status: Never   Smokeless tobacco: Never  Vaping Use   Vaping status: Never Used  Substance and Sexual Activity   Alcohol  use: No   Drug use: No   Sexual activity: Not Currently    Partners: Male    Birth control/protection: Surgical, Post-menopausal    Comment: Hysterectomy  Other Topics Concern   Not on file  Social History Narrative   Marital status: divorced; dating seriously x many years.       Children:  2 children; 1 grandchild.      Employment: Research officer, political party.       Lives: alone      Tobacco: none       Alcohol : none      Exercise:  Walking with sister two days per week.      Seatbelt: 100%      Guns: none      Right-handed   Caffeine: occasional coffee, hot tea or green tea a few times per week   Social Drivers of Health   Financial Resource Strain: Not on file  Food Insecurity: No Food Insecurity (11/07/2023)   Hunger Vital Sign    Worried About Running Out of Food in the Last Year: Never true    Ran Out of Food in the Last Year: Never true  Transportation Needs: No Transportation Needs (11/07/2023)   PRAPARE - Administrator, Civil Service (Medical): No    Lack of Transportation (Non-Medical): No  Physical Activity: Not on file  Stress: Not on file  Social Connections: Not on file  Intimate Partner Violence: Not At Risk (11/07/2023)   Humiliation, Afraid, Rape, and Kick questionnaire    Fear of Current or Ex-Partner: No    Emotionally Abused: No    Physically Abused: No    Sexually Abused: No     There were no vitals taken  for this visit.  Physical Exam:  Well appearing NAD HEENT: Unremarkable Neck:  No JVD, no thyromegally Lymphatics:  No adenopathy Back:  No CVA tenderness Lungs:  Clear HEART:  Regular rate rhythm, no murmurs, no rubs, no clicks Abd:  soft, positive bowel sounds, no organomegally, no rebound, no guarding Ext:  2 plus pulses, no edema, no cyanosis, no clubbing Skin:  No rashes no nodules Neuro:  CN II through XII intact, motor grossly intact  EKG  DEVICE  Normal device function.  See PaceArt for details.   Assess/Plan:  Chronic systolic heart failure - her symptoms are class 2A, down from 3B. She will continue her current meds and I have encouraged regular exercise. ICD - her biv ICD is programmed DDD and she is maintaining NSR. Breast CA - she is s/p bilateral mastectomy. She appears to have finally healed her incisions.      Pete Brand Vern Guerette,MD

## 2024-02-11 NOTE — Patient Instructions (Signed)
 Medication Instructions:  Your physician recommends that you continue on your current medications as directed. Please refer to the Current Medication list given to you today.  *If you need a refill on your cardiac medications before your next appointment, please call your pharmacy*  Lab Work: None ordered.  You may go to any [facility] Location for your lab work:  [facility] - [address] ([facility]) - [address] [phone] [address]  [place] - [address]   [place]  - [address]   [place] - [address] - [address] [employer] [place]  - [address] - [address] ([facility]   If you have labs (blood work) drawn today and your tests are completely normal, you will receive your results only by: [employer] (if you have MyChart)  If you have any lab test that is abnormal or we need to change your treatment, we will call you or send a MyChart message to review the results.  Testing/Procedures: None ordered.  Follow-Up: At [facility], you and your health needs are our priority.  As part of our continuing mission to provide you with exceptional heart care, we have created designated Provider Care Teams.  These Care Teams include your primary Cardiologist (physician) and Advanced Practice Providers (APPs -  Physician Assistants and Nurse Practitioners) who all work together to provide you with the care you need, when you need it.   Your next appointment:   1 year(s)  The format for your next appointment:   In Person  Provider:   Dr Virl Son one of the following Advanced Practice Providers on your designated Care Team:   Francis Dowse, PA-C Casimiro Needle "Mardelle Matte" [place], [place] [name], NP  Note: Remote monitoring is used to monitor your Pacemaker/ ICD from home. This monitoring reduces the number of office visits required to check your  device to one time per year. It allows [place] to keep an eye on the functioning of your device to ensure it is working properly.            Valet parking services will be available as well.

## 2024-02-12 ENCOUNTER — Encounter: Payer: Self-pay | Admitting: Internal Medicine

## 2024-02-12 LAB — CUP PACEART REMOTE DEVICE CHECK
Battery Remaining Longevity: 52 mo
Battery Remaining Percentage: 76 %
Battery Voltage: 2.98 V
Brady Statistic AP VP Percent: 6.3 %
Brady Statistic AP VS Percent: 1 %
Brady Statistic AS VP Percent: 93 %
Brady Statistic AS VS Percent: 1 %
Brady Statistic RA Percent Paced: 6.2 %
Date Time Interrogation Session: 20250422020018
HighPow Impedance: 66 Ohm
HighPow Impedance: 66 Ohm
Implantable Lead Connection Status: 753985
Implantable Lead Connection Status: 753985
Implantable Lead Connection Status: 753985
Implantable Lead Implant Date: 20250116
Implantable Lead Implant Date: 20250116
Implantable Lead Implant Date: 20250116
Implantable Lead Location: 753859
Implantable Lead Location: 753860
Implantable Lead Location: 753860
Implantable Pulse Generator Implant Date: 20231103
Lead Channel Impedance Value: 280 Ohm
Lead Channel Impedance Value: 490 Ohm
Lead Channel Impedance Value: 510 Ohm
Lead Channel Pacing Threshold Amplitude: 0.25 V
Lead Channel Pacing Threshold Amplitude: 0.75 V
Lead Channel Pacing Threshold Amplitude: 0.75 V
Lead Channel Pacing Threshold Pulse Width: 0.4 ms
Lead Channel Pacing Threshold Pulse Width: 0.5 ms
Lead Channel Pacing Threshold Pulse Width: 0.5 ms
Lead Channel Sensing Intrinsic Amplitude: 12 mV
Lead Channel Sensing Intrinsic Amplitude: 3.6 mV
Lead Channel Setting Pacing Amplitude: 0.25 V
Lead Channel Setting Pacing Amplitude: 2.5 V
Lead Channel Setting Pacing Amplitude: 3.5 V
Lead Channel Setting Pacing Pulse Width: 0.05 ms
Lead Channel Setting Pacing Pulse Width: 1 ms
Lead Channel Setting Sensing Sensitivity: 0.5 mV
Pulse Gen Serial Number: 5548670
Zone Setting Status: 755011

## 2024-02-25 ENCOUNTER — Ambulatory Visit: Payer: 59

## 2024-02-26 DIAGNOSIS — E059 Thyrotoxicosis, 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: Secondary | ICD-10-CM | POA: Diagnosis not present

## 2024-02-26 DIAGNOSIS — E7800 Pure hypercholesterolemia, unspecified: Secondary | ICD-10-CM | POA: Diagnosis not present

## 2024-02-26 DIAGNOSIS — E1165 Type 2 diabetes mellitus with hyperglycemia: Secondary | ICD-10-CM | POA: Diagnosis not present

## 2024-02-26 DIAGNOSIS — I10 Essential (primary) hypertension: Secondary | ICD-10-CM | POA: Diagnosis not present

## 2024-02-26 DIAGNOSIS — I959 Hypotension, unspecified: Secondary | ICD-10-CM | POA: Diagnosis not present

## 2024-02-26 LAB — LAB REPORT - SCANNED
A1c: 7.2
EGFR (African American): 66
TSH: 0.8 (ref 0.41–5.90)

## 2024-03-02 DIAGNOSIS — Z0000 Encounter for general adult medical examination without abnormal findings: Secondary | ICD-10-CM | POA: Diagnosis not present

## 2024-03-02 DIAGNOSIS — J309 Allergic rhinitis, unspecified: Secondary | ICD-10-CM | POA: Diagnosis not present

## 2024-03-02 DIAGNOSIS — I110 Hypertensive heart disease with heart failure: Secondary | ICD-10-CM | POA: Diagnosis not present

## 2024-03-02 DIAGNOSIS — E8809 Other disorders of plasma-protein metabolism, not elsewhere classified: Secondary | ICD-10-CM | POA: Diagnosis not present

## 2024-03-19 ENCOUNTER — Telehealth: Payer: Self-pay | Admitting: Cardiology

## 2024-03-19 NOTE — Telephone Encounter (Signed)
 Per phone note for med management on 11/18/23: Jordan Willis [facility]    11/18/23  2:46 PM Note Medication reconciliation complete for entresto  Script updated      _____________________________________________________  This was ordered by Dr. Bensimhon.

## 2024-03-19 NOTE — Telephone Encounter (Signed)
 Spoke w/pt, she states she realized this AM that her Entresto  bottles says to take 1/2 tab BID, it is the 24/25 mg tabs, she reports she has been taking 1 tab BID and wanted to make sure that was ok. Per chart she has been on 24/26 mg BID for a while, upon further discussion she reports seeing Dr Schuyler Custard (PCP) recently for low BP and feels like he decreased it but she never made the change. She checks BP at home and states it has been running good, denies any dizziness/lightheadedness. She will continue 24/26 mg BID and let us  know if she has any issues or low BP readings.

## 2024-03-19 NOTE — Telephone Encounter (Signed)
 Pt c/o medication issue:  1. Name of Medication: sacubitril -valsartan  (ENTRESTO ) 24-26 MG   2. How are you currently taking this medication (dosage and times per day)? 1 tablet, 2 times daily   3. Are you having a reaction (difficulty breathing--STAT)? No  4. What is your medication issue? Pt states she just noticed her bottle saying 1/2 tab by mouth twice a day. Needs clarification, requesting cb

## 2024-03-24 ENCOUNTER — Inpatient Hospital Stay: Payer: 59

## 2024-03-24 ENCOUNTER — Inpatient Hospital Stay: Payer: 59 | Admitting: Hematology and Oncology

## 2024-03-24 DIAGNOSIS — R0789 Other chest pain: Secondary | ICD-10-CM | POA: Diagnosis not present

## 2024-03-24 DIAGNOSIS — M8580 Other specified disorders of bone density and structure, unspecified site: Secondary | ICD-10-CM | POA: Diagnosis not present

## 2024-03-24 DIAGNOSIS — C50919 Malignant neoplasm of unspecified site of unspecified female breast: Secondary | ICD-10-CM | POA: Diagnosis not present

## 2024-03-24 NOTE — Assessment & Plan Note (Deleted)
 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  Cycle 8 Herceptin  (because of cardiomyopathy, Dr. Julane Ny is watching very closely) completed 07/04/2023 (stopped because of cardiac dysfunction EF 20 to 25%)   Signatera: 01/02/2023: Positive 0.07 MTM/ml Signatera 04/17/2023: Negative Signatera 07/06/2023: Negative   Bone scan 08/06/2023: Increased uptake within the sternum and manubrium similar to before.

## 2024-03-25 ENCOUNTER — Inpatient Hospital Stay: Attending: Hematology and Oncology

## 2024-03-25 ENCOUNTER — Inpatient Hospital Stay (HOSPITAL_BASED_OUTPATIENT_CLINIC_OR_DEPARTMENT_OTHER): Admitting: Hematology and Oncology

## 2024-03-25 VITALS — BP 118/64 | HR 75 | Temp 97.6°F | Resp 18 | Ht 67.75 in | Wt 176.3 lb

## 2024-03-25 DIAGNOSIS — C50411 Malignant neoplasm of upper-outer quadrant of right female breast: Secondary | ICD-10-CM | POA: Diagnosis not present

## 2024-03-25 DIAGNOSIS — Z171 Estrogen receptor negative status [ER-]: Secondary | ICD-10-CM | POA: Diagnosis not present

## 2024-03-25 DIAGNOSIS — Z79811 Long term (current) use of aromatase inhibitors: Secondary | ICD-10-CM | POA: Diagnosis not present

## 2024-03-25 DIAGNOSIS — Z9013 Acquired absence of bilateral breasts and nipples: Secondary | ICD-10-CM | POA: Diagnosis not present

## 2024-03-25 DIAGNOSIS — Z1722 Progesterone receptor negative status: Secondary | ICD-10-CM | POA: Insufficient documentation

## 2024-03-25 DIAGNOSIS — Z1732 Human epidermal growth factor receptor 2 negative status: Secondary | ICD-10-CM | POA: Diagnosis not present

## 2024-03-25 DIAGNOSIS — Z923 Personal history of irradiation: Secondary | ICD-10-CM | POA: Insufficient documentation

## 2024-03-25 DIAGNOSIS — Z1721 Progesterone receptor positive status: Secondary | ICD-10-CM | POA: Insufficient documentation

## 2024-03-25 DIAGNOSIS — Z170 Estrogen receptor positive status [ER+]: Secondary | ICD-10-CM | POA: Insufficient documentation

## 2024-03-25 DIAGNOSIS — C50412 Malignant neoplasm of upper-outer quadrant of left female breast: Secondary | ICD-10-CM | POA: Diagnosis not present

## 2024-03-25 LAB — CBC WITH DIFFERENTIAL (CANCER CENTER ONLY)
Abs Immature Granulocytes: 0.02 10*3/uL (ref 0.00–0.07)
Basophils Absolute: 0 10*3/uL (ref 0.0–0.1)
Basophils Relative: 1 %
Eosinophils Absolute: 0.4 10*3/uL (ref 0.0–0.5)
Eosinophils Relative: 7 %
HCT: 34.2 % — ABNORMAL LOW (ref 36.0–46.0)
Hemoglobin: 11.5 g/dL — ABNORMAL LOW (ref 12.0–15.0)
Immature Granulocytes: 0 %
Lymphocytes Relative: 32 %
Lymphs Abs: 1.8 10*3/uL (ref 0.7–4.0)
MCH: 29.3 pg (ref 26.0–34.0)
MCHC: 33.6 g/dL (ref 30.0–36.0)
MCV: 87 fL (ref 80.0–100.0)
Monocytes Absolute: 0.5 10*3/uL (ref 0.1–1.0)
Monocytes Relative: 9 %
Neutro Abs: 2.9 10*3/uL (ref 1.7–7.7)
Neutrophils Relative %: 51 %
Platelet Count: 277 10*3/uL (ref 150–400)
RBC: 3.93 MIL/uL (ref 3.87–5.11)
RDW: 12 % (ref 11.5–15.5)
WBC Count: 5.7 10*3/uL (ref 4.0–10.5)
nRBC: 0 % (ref 0.0–0.2)

## 2024-03-25 LAB — CMP (CANCER CENTER ONLY)
ALT: 22 U/L (ref 0–44)
AST: 23 U/L (ref 15–41)
Albumin: 5.2 g/dL — ABNORMAL HIGH (ref 3.5–5.0)
Alkaline Phosphatase: 93 U/L (ref 38–126)
Anion gap: 9 (ref 5–15)
BUN: 18 mg/dL (ref 8–23)
CO2: 31 mmol/L (ref 22–32)
Calcium: 10.9 mg/dL — ABNORMAL HIGH (ref 8.9–10.3)
Chloride: 95 mmol/L — ABNORMAL LOW (ref 98–111)
Creatinine: 0.88 mg/dL (ref 0.44–1.00)
GFR, Estimated: >60 mL/min (ref >60)
Glucose, Bld: 91 mg/dL (ref 70–99)
Potassium: 3.6 mmol/L (ref 3.5–5.1)
Sodium: 135 mmol/L (ref 135–145)
Total Bilirubin: 0.5 mg/dL (ref 0.0–1.2)
Total Protein: 8.7 g/dL — ABNORMAL HIGH (ref 6.5–8.1)

## 2024-03-25 NOTE — Progress Notes (Signed)
 Patient Care Team: Imelda Man, MD as PCP - General (Internal Medicine) Olinda Bertrand, DO as PCP - Cardiology (Cardiology) Tammie Fall, MD as PCP - Electrophysiology (Cardiology) Bensimhon, Rheta Celestine, MD as PCP - Advanced Heart Failure (Cardiology) Ona Bidding, MD as Attending Physician (Obstetrics and Gynecology) Cameron Cea, MD as Consulting Physician (Hematology and Oncology) Lockie Rima, MD as Consulting Physician (General Surgery) Tammie Fall, MD as Consulting Physician (Cardiology) Auther Bo, RN as Oncology Nurse Navigator Alane Hsu, RN as Oncology Nurse Navigator  DIAGNOSIS:  Encounter Diagnosis  Name Primary?   Malignant neoplasm of upper-outer quadrant of left breast in female, estrogen receptor negative (HCC) Yes    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%, PR 0%, HER-2 negative, Ki-67 31%   09/26/2005 - 11/13/2005 Radiation Therapy   Adjuvant radiation therapy   03/18/2008 Initial Biopsy   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   03/22/2008 Surgery   Right breast lumpectomy 0.9 cm DCIS ER 90% PR 98% stage 0   04/11/2008 Procedure   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   05/10/2008 - 07/02/2008 Radiation Therapy   Adjuvant radiation therapy   07/17/2008 - 04/16/2009 Anti-estrogen oral therapy   Femara  then switched to tamoxifen  which was discontinued in 2010 due to concern about uterine cancer   05/31/2021 Cancer Staging   Staging form: Breast, AJCC 7th Edition - Clinical: Stage IA (rT1c, N0, M0) - Signed by Cameron Cea, MD on 05/31/2021 Stage prefix: Recurrence Laterality: Right Biopsy of metastatic site performed: No Estrogen receptor status: Positive Progesterone receptor status: Negative HER2 status: Positive   09/12/2021 Cancer Staging   Staging form: Breast, AJCC 7th Edition - Pathologic: Stage IIA (T2, N0, cM0) - Signed by Cameron Cea, MD on 09/12/2021 Estrogen receptor status: Positive Progesterone receptor status: Positive HER2 status: Negative   10/05/2022 Relapse/Recurrence   Palpable lump in the right mastectomy scar: 1.4 x 1.3 x 1.3 cm and a 0.4 cm mass (presence of small residual fluid): Biopsy revealed grade 3 IDC with necrosis ER 90% PR 0% Ki-67 70%, HER2 3+ positive   11/06/2022 Imaging   CT chest/abdomen/pelvis Developing small nodule along the right chest wall lateral to the pectoralis muscle inferior to the battery pack of the patient's defibrillator. A malignant lesion is possible. Recommend further workup such as ultrasound.   No developing other new mass lesion, fluid collection or lymph node enlargement.   Persistent sclerosis diffusely along the sternum.   Fatty liver infiltration.   Left-sided renal stone   Aortic atherosclerosis.  ICD 10 code-I70.0    11/06/2022 Imaging   Intense sternal/manubrial radiotracer uptake corresponding with the expansile lucency and sclerosis seen on CT is compatible with osseous metastatic disease.   No other convincing scintigraphic evidence of osteoblastic metastatic disease   12/04/2022 Surgery   Right breast mastectomy scar with chest wall mass 3  exertion: Grade 3 poorly differentiated ductal adenocarcinoma with extensive tumor necrosis, margins negative involving subcutis and deep dermis, vascular invasion present.  ER 90%, PR 0%,  Ki-67 70%, HER2 3+ positive Left breast mastectomy scar reexcision: Benign   12/04/2022 Relapse/Recurrence   12/04/2022: Right breast mastectomy scar with chest wall mass 3 exertion: Grade 3 poorly differentiated ductal adenocarcinoma with extensive tumor necrosis, margins negative involving subcutis and deep dermis, vascular invasion present. ER 90%, PR 0%, Ki-67 70%, HER2 3+ positive    02/06/2023 -  Chemotherapy   Patient is on Treatment Plan : BREAST MAINTENANCE Trastuzumab  IV (6) or SQ (600) D1 q21d X 11 Cycles     Breast cancer of upper-outer quadrant of right female breast (HCC)  05/23/2021 Relapse/Recurrence   Screening detected right breast mass at 10 o'clock position measuring 1.2 cm, ultrasound-guided biopsy revealed grade 3 IDC ER 20%, PR 0%, HER2 positive 3+, Ki-67 50%   09/04/2021 Surgery   Bilateral mastectomies Left mastectomy: Benign Right mastectomy: Grade 3 IDC with DCIS 3.6 cm, 0/6 lymph nodes negative, ER 20%, PR 0%, HER2 positive by IHC, Ki-67 50%   11/21/2021 - 01/23/2022 Chemotherapy   Patient is on Treatment Plan : BREAST Trastuzumab  q21d     04/2022 -  Anti-estrogen oral therapy   Anastrozole  daily; changed to Exemestane      CHIEF COMPLIANT:   HISTORY OF PRESENT ILLNESS: Follow-up on anastrozole  therapy  History of Present Illness Jordan Willis is a 73 year old female who presents with persistent pain at the pacemaker site.  She experiences constant pain at the site of her pacemaker and defibrillator, radiating to her shoulder and down her arm, affecting her sleep. The pacemaker was repositioned, requiring additional surgery, which she believes has contributed to scar tissue accumulation.  She underwent a mastectomy in 2024 for breast cancer and is currently on anastrozole  for estrogen receptor-positive cancer, taking it for over a year. She is concerned about potential side effects, including muscle aches and pains.  She manages her diabetes with metformin ,  taking two pills daily, with no current diarrhea. She takes Tylenol  for pain relief but is concerned about its impact on her liver and kidneys. She engages in walking and exercises regularly.     ALLERGIES:  is allergic to bactrim  [sulfamethoxazole -trimethoprim ], aspirin , sulfa  antibiotics, and tape.  MEDICATIONS:  Current Outpatient Medications  Medication Sig Dispense Refill   albuterol  (VENTOLIN  HFA) 108 (90 Base) MCG/ACT inhaler Inhale 2 puffs into the lungs every 6 (six) hours as needed for wheezing or shortness of breath.     ALOMIDE 0.1 % ophthalmic solution SMARTSIG:1-2 Drop(s) In Eye(s) 4 Times Daily     baclofen (LIORESAL) 10 MG tablet Take 10 mg by mouth daily as needed for muscle spasms.     bumetanide  (BUMEX ) 1 MG tablet TAKE 1 TABLET BY MOUTH EVERY DAY 90 tablet 2   cetirizine (ZYRTEC) 10 MG tablet Take 10 mg by mouth in the morning.     Cholecalciferol (VITAMIN D3) 25 MCG (1000 UT) CAPS Take 1,000 Units by mouth in the morning.     cyanocobalamin (VITAMIN B12) 1000 MCG tablet Take 1,000 mcg by mouth daily.     dapagliflozin  propanediol (FARXIGA ) 10 MG TABS tablet TAKE 1 TABLET BY MOUTH EVERY DAY BEFORE BREAKFAST 90 tablet 3   diclofenac Sodium (VOLTAREN) 1 % GEL Apply 1 Application topically 4 (four) times daily as needed (pain).     exemestane  (AROMASIN ) 25 MG tablet TAKE 1 TABLET (25 MG TOTAL)  BY MOUTH DAILY AFTER BREAKFAST. 90 tablet 3   ezetimibe (ZETIA) 10 MG tablet Take 10 mg by mouth daily.     fluticasone  (FLONASE ) 50 MCG/ACT nasal spray Place 1 spray into both nostrils in the morning.     GLUCOSAMINE-CHONDROITIN PO Take 1 tablet by mouth every evening.      glucose blood (ONETOUCH VERIO) test strip TEST ONCE DAILY AS DIRECTED 90     metFORMIN  (GLUCOPHAGE -XR) 500 MG 24 hr tablet Take 500 mg by mouth 2 (two) times daily.     metolazone  (ZAROXOLYN ) 2.5 MG tablet Take 1 tablet (2.5 mg total) by mouth every other day. 30 minutes prior to taking your Bumex  90 tablet 3    metoprolol  succinate (TOPROL -XL) 25 MG 24 hr tablet TAKE 1 TABLET (25 MG TOTAL) BY MOUTH IN THE MORNING. HOLD IF SYSTOLIC BLOOD PRESSURE (TOP BLOOD PRESSURE NUMBER) LESS THAN 100 MMHG OR HEART RATE LESS THAN 60 BPM (PULSE). 90 tablet 3   Multiple Vitamin (MULTIVITAMIN WITH MINERALS) TABS tablet Take 1 tablet by mouth daily. Complete     OneTouch Delica Lancets 33G MISC Apply topically.     potassium chloride  SA (KLOR-CON  M) 20 MEQ tablet TAKE 2 TABLETS BY MOUTH DAILY 180 tablet 2   Propylene Glycol (SYSTANE BALANCE OP) Place 1 drop into both eyes 2 (two) times daily.     rosuvastatin (CRESTOR) 20 MG tablet Take 20 mg by mouth at bedtime.     sacubitril -valsartan  (ENTRESTO ) 24-26 MG Take 1 tablet by mouth 2 (two) times daily. 60 tablet 11   sertraline  (ZOLOFT ) 50 MG tablet Take 50 mg by mouth in the morning.     spironolactone  (ALDACTONE ) 25 MG tablet TAKE 1 TABLET BY MOUTH EVERY DAY IN THE MORNING 90 tablet 3   ibuprofen  (ADVIL ) 200 MG tablet Take 400 mg by mouth every 6 (six) hours as needed for mild pain or moderate pain. (Patient not taking: Reported on 03/25/2024)     nitroGLYCERIN  (NITROSTAT ) 0.4 MG SL tablet Place 0.4 mg under the tongue every 5 (five) minutes as needed for chest pain. (Patient not taking: Reported on 03/25/2024)     Olopatadine  HCl 0.2 % SOLN Place 1 drop into both eyes daily as needed (allergy). [place]  (Patient not taking: Reported on 03/25/2024)     ondansetron  (ZOFRAN ) 4 MG tablet Take 1 tablet (4 mg total) by mouth every 6 (six) hours as needed for nausea or vomiting. (Patient not taking: Reported on 03/25/2024) 20 tablet 0   pantoprazole (PROTONIX) 40 MG tablet Take 40 mg by mouth daily as needed (Stomach problem). (Patient not taking: Reported on 03/25/2024)     ZINC OXIDE-VITAMIN C PO Take 1 tablet by mouth daily. With vit D (Patient not taking: Reported on 03/25/2024)     No current facility-administered medications for this visit.    PHYSICAL EXAMINATION: ECOG PERFORMANCE  STATUS: 1 - Symptomatic but completely ambulatory  Vitals:   03/25/24 1425  BP: 118/64  Pulse: 75  Resp: 18  Temp: 97.6 F (36.4 C)  SpO2: 100%   Filed Weights   03/25/24 1425  Weight: 176 lb 4.8 oz (80 kg)     LABORATORY DATA:  I have reviewed the data as listed    Latest Ref Rng & Units 03/25/2024    1:46 PM 11/29/2023    2:52 PM 11/07/2023    8:14 AM  CMP  Glucose 70 - 99 mg/dL 91  604    BUN 8 - 23 mg/dL 18  39    Creatinine 0.44 - 1.00 mg/dL 0.34  7.42    Sodium 595 - 145 mmol/L 135  136  140   Potassium 3.5 - 5.1 mmol/L 3.6  3.3  3.9   Chloride 98 - 111 mmol/L 95  92    CO2 22 - 32 mmol/L 31  24    Calcium  8.9 - 10.3 mg/dL 63.8  75.6    Total Protein 6.5 - 8.1 g/dL 8.7     Total Bilirubin 0.0 - 1.2 mg/dL 0.5     Alkaline Phos 38 - 126 U/L 93     AST 15 - 41 U/L 23     ALT 0 - 44 U/L 22       Lab Results  Component Value Date   WBC 5.7 03/25/2024   HGB 11.5 (L) 03/25/2024   HCT 34.2 (L) 03/25/2024   MCV 87.0 03/25/2024   PLT 277 03/25/2024   NEUTROABS 2.9 03/25/2024    ASSESSMENT & PLAN:  Malignant neoplasm of upper-outer quadrant of left breast in female, estrogen receptor negative (HCC) 12/04/2022: Right breast mastectomy scar with chest wall mass 3 exertion: Grade 3 poorly differentiated ductal adenocarcinoma with extensive tumor necrosis, margins negative involving subcutis and deep dermis, vascular invasion present.  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  Cycle 8 Herceptin  (because of cardiomyopathy, Dr. Julane Ny is watching very closely) completed 07/04/2023 (stopped because of cardiac dysfunction EF 20 to 25%)   Signatera: 01/02/2023: Positive 0.07 MTM/ml Signatera 04/17/2023: Negative Signatera 07/06/2023: Negative Signatera March 2025: Negative   Bone scan 08/06/2023: Increased uptake within the sternum and manubrium similar to before.   Diffuse muscle aches and pains: I recommended that she discontinue anastrozole  for 3 months.  If her symptoms resolve then we can talk about switching her to letrozole .  If her symptoms do not resolve then she will restart it. I sent a referral to physical therapy for dry needling.  Pacemaker related pain  Assessment & Plan Malignant neoplasm of left breast Estrogen receptor-positive and HER2-positive breast cancer. Experiencing anastrozole -related muscle aches. Considering medication break due to side effects. - Discontinue anastrozole  for three months to assess muscle ache improvement. - Schedule follow-up blood test in September for cancer markers. - Consider alternative medication if symptoms improve post-discontinuation. - Refer to physical therapy for dry needling for pain management.  Cardiac dysfunction with pacemaker Persistent pain at pacemaker site due to scar tissue and possible nerve involvement. Exploring non-pharmacological pain management. - Refer to physical therapy for dry needling, covered by insurance. - Coordinate with primary care physician for potential acupuncture referral.  Type 2 diabetes mellitus Diarrhea from metformin  resolved. Current regimen maintains  glucose control. - Continue current metformin  regimen with two doses daily. - Monitor blood glucose levels and adjust medication with diabetic specialist.      No orders of the  defined types were placed in this encounter.  The patient has a good understanding of the overall plan. she agrees with it. she will call with any problems that may develop before the next visit here. Total time spent: 30 mins including face to face time and time spent for planning, charting and co-ordination of care   Margert Sheerer, MD 03/25/24

## 2024-03-25 NOTE — Assessment & Plan Note (Signed)
 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  Cycle 8 Herceptin  (because of cardiomyopathy, Dr. Julane Ny is watching very closely) completed 07/04/2023 (stopped because of cardiac dysfunction EF 20 to 25%)   Signatera: 01/02/2023: Positive 0.07 MTM/ml Signatera 04/17/2023: Negative Signatera 07/06/2023: Negative   Bone scan 08/06/2023: Increased uptake within the sternum and manubrium similar to before.

## 2024-03-26 NOTE — Addendum Note (Signed)
 Addended by: Lott Rouleau A on: 03/26/2024 02:41 PM   Modules accepted: Orders

## 2024-03-26 NOTE — Progress Notes (Signed)
 Remote ICD transmission.

## 2024-03-29 NOTE — Therapy (Signed)
 OUTPATIENT PHYSICAL THERAPY  UPPER EXTREMITY ONCOLOGY EVALUATION  Patient Name: Jordan Willis MRN: [id number] DOB:[birth year], 73 y.o., female Today's Date: 03/29/2024  END OF SESSION:   Past Medical History:  Diagnosis Date   AICD (automatic cardioverter/defibrillator) present    Anemia    Anginal pain (HCC)    Anxiety    Arthritis    Lumbar spine DDD.   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   Diabetes mellitus without complication (HCC)    Type II   Dyspnea    When patient gets sick uses Pro-Air inhaler   Frequent sinus infections    GERD (gastroesophageal reflux disease)    occ   Goiter    History of cardiomegaly    History of cataract    Bilateral   History of kidney stones    08/28/2018 currently has a large kidney stone, asymptomatic at this time   History of migraine    History of uterine fibroid    History of uterine prolapse    Hyperlipidemia    Hypertension    LBBB (left bundle branch block)    Menopause 03/03/2012   Nasal turbinate hypertrophy 11/29/2016   Left inferior    Personal history of chemotherapy    Personal history of radiation therapy    Pneumonia    as a child   PONV (postoperative nausea and vomiting)    Sinusitis 2014   Sleep apnea    No CPAP   SUI (stress urinary incontinence, female)    SVD (spontaneous vaginal delivery)    x 2   Thyroid  nodule    Past Surgical History:  Procedure Laterality Date   ABDOMINAL HYSTERECTOMY     BILATERAL SALPINGOOPHORECTOMY Bilateral    BIV ICD INSERTION CRT-D N/A 08/24/2022   Procedure: BIV ICD INSERTION CRT-D;  Surgeon: Tammie Fall, MD;  Location: [facility] INVASIVE CV LAB;  Service: Cardiovascular;  Laterality: N/A;   BIV ICD INSERTION CRT-D N/A 11/07/2023   Procedure: BIV ICD INSERTION CRT-D;  Surgeon: Tammie Fall,  MD;  Location: [facility] INVASIVE CV LAB;  Service: Cardiovascular;  Laterality: N/A;   BLADDER SUSPENSION N/A 05/16/2017   Procedure: TRANSVAGINAL TAPE (TVT) PROCEDURE;  Surgeon: Renea Carrion, MD;  Location: [facility] ORS;  Service: Gynecology;  Laterality: N/A;   BREAST BIOPSY Right 2009   BREAST IMPLANT REMOVAL Bilateral 02/05/2022   Procedure: Removal bilateral breast implants;  Surgeon: Barb Bonito, MD;  Location: [facility] OR;  Service: Plastics;  Laterality: Bilateral;   BREAST LUMPECTOMY Right 2009   BREAST LUMPECTOMY Left 2006   BREAST RECONSTRUCTION WITH PLACEMENT OF TISSUE EXPANDER AND FLEX HD (ACELLULAR HYDRATED DERMIS) Bilateral 09/04/2021   Procedure: BILATERAL BREAST RECONSTRUCTION WITH PLACEMENT OF TISSUE EXPANDER AND FLEX HD (ACELLULAR HYDRATED DERMIS);  Surgeon: Barb Bonito, MD;  Location: [facility] OR;  Service: Plastics;  Laterality: Bilateral;   CARDIAC CATHETERIZATION     CATARACT EXTRACTION Left 10/23/2017   CATARACT EXTRACTION Right 11/06/2017   COLONOSCOPY  10/22/2009   Normal.  Repeat 5 years.  [name]   COLONOSCOPY WITH PROPOFOL  N/A 12/21/2022   Procedure: COLONOSCOPY WITH PROPOFOL ;  Surgeon: Alvis Jourdain, MD;  Location: [facility] ENDOSCOPY;  Service: Gastroenterology;  Laterality: N/A;   CYSTOSCOPY N/A 05/16/2017   Procedure: CYSTOSCOPY;  Surgeon: Renea Carrion, MD;  Location: [facility]  ORS;  Service: Gynecology;  Laterality: N/A;   DEBRIDEMENT AND CLOSURE WOUND Bilateral 10/03/2021   Procedure: Debridement bilateral mastectomy flaps;  Surgeon: Barb Bonito, MD;  Location: [facility] OR;  Service: Plastics;  Laterality: Bilateral;  1.5 hour   EYE SURGERY     bilateral cataracts   LEAD EXTRACTION N/A 11/07/2023   Procedure: LEAD EXTRACTION;  Surgeon: Tammie Fall, MD;  Location: [facility] INVASIVE CV LAB;  Service: Cardiovascular;  Laterality: N/A;   LEFT HEART CATH AND CORONARY ANGIOGRAPHY N/A 02/09/2020   Procedure: LEFT HEART CATH AND CORONARY ANGIOGRAPHY;  Surgeon: Cody Das, MD;   Location: [facility] INVASIVE CV LAB;  Service: Cardiovascular;  Laterality: N/A;   LEFT HEART CATH AND CORONARY ANGIOGRAPHY N/A 03/27/2022   Procedure: LEFT HEART CATH AND CORONARY ANGIOGRAPHY;  Surgeon: Knox Perl, MD;  Location: [facility] INVASIVE CV LAB;  Service: Cardiovascular;  Laterality: N/A;   MASS EXCISION Right 12/04/2022   Procedure: EXCISION OF RIGHT CHEST WALL MASS;  Surgeon: Lockie Rima, MD;  Location: [facility] OR;  Service: General;  Laterality: Right;   MASTECTOMY W/ SENTINEL NODE BIOPSY Right 09/04/2021   Procedure: BILATERAL MASTECTOMIES WITH RIGHT SENTINEL LYMPH NODE BIOPSY;  Surgeon: Lockie Rima, MD;  Location: [facility] OR;  Service: General;  Laterality: Right;   REMOVAL OF BILATERAL TISSUE EXPANDERS WITH PLACEMENT OF BILATERAL BREAST IMPLANTS Bilateral 10/03/2021   Procedure: REMOVAL OF BILATERAL TISSUE EXPANDERS WITH REPLACEMENT OF TISSUE EXPANDERS;  Surgeon: Barb Bonito, MD;  Location: [facility] OR;  Service: Plastics;  Laterality: Bilateral;   REMOVAL OF BILATERAL TISSUE EXPANDERS WITH PLACEMENT OF BILATERAL BREAST IMPLANTS Bilateral 12/19/2021   Procedure: REMOVAL OF BILATERAL TISSUE EXPANDERS WITH PLACEMENT OF BILATERAL BREAST IMPLANTS;  Surgeon: Barb Bonito, MD;  Location: [facility] OR;  Service: Plastics;  Laterality: Bilateral;   ROBOTIC ASSISTED TOTAL HYSTERECTOMY N/A 05/16/2017   Procedure: ROBOTIC ASSISTED TOTAL HYSTERECTOMY;  Surgeon: Ona Bidding, MD;  Location: [facility] ORS;  Service: Gynecology;  Laterality: N/A;   ROTATOR CUFF REPAIR Bilateral    SCAR REVISION N/A 12/04/2022   Procedure: REVISION OF MASTECTOMY SCAR;  Surgeon: Lockie Rima, MD;  Location: [facility] OR;  Service: General;  Laterality: N/A;   SHOULDER ARTHROSCOPY WITH ROTATOR CUFF REPAIR AND SUBACROMIAL DECOMPRESSION Right 09/03/2018   Procedure: Right shoulder manipulation under anesthesia, exam under anesthesia, mini open rotator cuff repair, subacromial decompression;  Surgeon: Orvan Blanch, MD;  Location: [facility] ORS;  Service:  Orthopedics;  Laterality: Right;  90 mins   TRANSESOPHAGEAL ECHOCARDIOGRAM (CATH LAB) N/A 11/07/2023   Procedure: TRANSESOPHAGEAL ECHOCARDIOGRAM;  Surgeon: Tammie Fall, MD;  Location: [facility] INVASIVE CV LAB;  Service: Cardiovascular;  Laterality: N/A;   TUBAL LIGATION     WISDOM TOOTH EXTRACTION     Patient Active Problem List   Diagnosis Date Noted   Chronic systolic heart failure (HCC) 11/07/2023   ICD (implantable cardioverter-defibrillator) in place 12/25/2022   Bradycardia 08/24/2022   BRCA2 gene mutation positive in female 07/30/2022   LBBB (left bundle branch block)    Chronic diastolic heart failure (HCC)    NICM (nonischemic cardiomyopathy) (HCC)    Breast cancer of upper-outer quadrant of right female breast (HCC) 09/04/2021   Diabetic neuropathy (HCC) 02/08/2021   Angina pectoris (HCC) 02/09/2020   Abnormal stress test 02/09/2020   Complete rotator cuff tear 09/03/2018   Pelvic prolapse 05/16/2017   Intractable chronic post-traumatic headache 11/19/2016   Malignant neoplasm of upper-outer quadrant of left breast in female, estrogen receptor negative (HCC) 03/03/2012   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  ONSET DATE: 11/07/2023  Rationale for Evaluation and Treatment: Rehabilitation  SUBJECTIVE:                                                                                                                                                                                           SUBJECTIVE STATEMENT:  Pt is having pain at the pacemaker site on the left.It was originally placed on the right but was moved on 11/07/2023. Since then she has had pain  bilateral chest and at the pacemeaker site and radiating to her shoulder and down the arm tIt hurts intermittently.o the elbow.  The  right side of the chest isShe has tried pain medicine but it doesn't help the pain. It bothers me to reach and I massage it and it eases up for awhile. My gynecologist referred me for accupuncture.and her insurance is going to try make an exception for her to have it with the right documentation.  PERTINENT HISTORY:   Pt was diagnosed with right breast cancer dx 05/2021. She has BRCA2 genetic mutation and has had left breast cancer in 2006 with chemo/breast conservation and XRT. She then had right breast cancer (dcis)  in 2009 with lumpectomy and radiation only. In 2022 she presented  with a palpable painful mass in the upper outer right breast. Dx imaging showed a 1.2 cm mass at 10 o'clock. 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  Pt underwent bilateral mastectomies with right sentinel node biopsy and expander reconstruction (Pace) 09/04/21. She had 6 negative nodes and negative margins. Patient ended up having the expanders removed 01/26/2022 because of continued drainage and discomfort.  2.14.2024 : revision of left mastectomy scar and removal of mass on right lateral mastectomy scar. Core needle biopsy was performed and this demonstrated invasive ductal carcinoma, grade 3. This was weakly ER positive, PR negative, HER2 positive, Ki-67 70%. She had 11 cycles of Breast Maintenane Trastuzumab    Pt also has a Investment banker, corporate.  She has a decrease ejection fraction of 20- 30% .   PAIN:  Are you having pain? Yes NPRS scale: 7/10 present on left and 5/10 on right Pain location: bilateral chest greater on left and left upper arm Pain orientation: Bilateral  PAIN TYPE: aching, dull, sharp, and tight Pain description: constant on the left chest, but can be sharp, intermittent left arm pain Aggravating factors: sleeping on left,  reaching Relieving factors: massaging, topical, ibuprofen   PRECAUTIONS: at risk for lymphedema , pacemaker, defibrillator, DM, Right RTC  repair 2019, cardiomyopathy, LV EF 20-25%  RED FLAGS: None   WEIGHT BEARING RESTRICTIONS: No  FALLS:  Has patient fallen in last 6 months? No  LIVING ENVIRONMENT: Lives with: pts mom lives with her and her daughter temporarily Lives in: House/apartment  OCCUPATION: does not work  LEISURE: walks 3 days /week, can walk up to 2 miles with breaks  HAND DOMINANCE: right   PRIOR LEVEL OF FUNCTION: Independent  PATIENT GOALS: Decrease pain, improve ROM   OBJECTIVE: Note: Objective measures were completed at Evaluation unless otherwise noted.  COGNITION: Overall cognitive status: Within functional limits for tasks assessed   PALPATION: Tender bilateral UT, pectorals, incision areas from pacemaker B  OBSERVATIONS / OTHER ASSESSMENTS: Bilateral mastectomy incisions healed but very restricted, keloid scar right chest from pacemaker removal, and new keloid scar on left chest from new pacemaker.defibrillator placement.  SENSATION: Light touch: Deficits     POSTURE: forward head rounded shoulders   UPPER EXTREMITY AROM/PROM:  A/PROM RIGHT   eval   Shoulder extension 48  Shoulder flexion 127(pulls chest)  Shoulder abduction 110, pain pulling chest  Shoulder internal rotation   Shoulder external rotation     (Blank rows = not tested)  A/PROM LEFT   eval  Shoulder extension 40, tight  Shoulder flexion 65 (tight/pain chest)  Shoulder abduction 70 ( tight chest)  Shoulder internal rotation   Shoulder external rotation     (Blank rows = not tested)  CERVICAL AROM: All within functional limits: Limited 40-50% with Bilateral rotation and SB     UPPER EXTREMITY STRENGTH:   LYMPHEDEMA ASSESSMENTS:   SURGERY TYPE/DATE: Multiple surgeries 2006, 2013 2022, 2024 B.Mastectomies with reconstruction, Implants removed 2023 due to non healing wound,  NUMBER OF LYMPH NODES REMOVED: 6 right  CHEMOTHERAPY: YES, ongoing  RADIATION:YEs  HORMONE TREATMENT: Yes,  Anastrozole   INFECTIONS: non healing wound requiring removal of implants    LYMPHEDEMA ASSESSMENTS:   LANDMARK RIGHT  eval  At axilla    15 cm proximal to olecranon process   10 cm proximal to olecranon process   Olecranon process   15 cm proximal to ulnar styloid process   10 cm proximal to ulnar styloid process   Just proximal to ulnar styloid process   Across hand at thumb web space   At base of 2nd digit   (Blank rows = not tested)  LANDMARK LEFT  eval  At axilla    15 cm proximal to olecranon process   10 cm proximal to olecranon process   Olecranon process   15 cm proximal to ulnar styloid process   10 cm proximal to ulnar styloid process   Just proximal to ulnar styloid process   Across hand at thumb web space   At base of 2nd digit   (Blank rows = not tested)   FUNCTIONAL TESTS:    GAIT: WNL   QUICK DASH SURVEY: 36.36  TREATMENT DATE:  03/30/2024 Discussed POC, LOS, treatment interventions.. Instructed in supine wand exs for flexion and scaption x 5 ea and supine stargazer x 5. Updated HEP with illustrated and written instructions, also showed scapular retraction, but forgot to give pic. Discussed new charging rule for DN.   PATIENT EDUCATION:  Education details: supine wand exs for flexion and scaption x 5 ea and supine stargazer. Person educated: Patient Education method: Explanation, Demonstration, and Handouts Education comprehension: verbalized understanding and returned demonstration  HOME EXERCISE PROGRAM: supine wand exs for flexion and scaption x 5 ea and supine stargazer x 5 to perform 2x/day  ASSESSMENT:  CLINICAL IMPRESSION: Patient is a 73 y.o. female who was seen today for physical therapy evaluation and treatment for complaints of pain at the left chest area in the region of the pacemaker, and radiating to the  shoulder and down her arm. Pacemaker/defibrillator  was moved from the right side in Jan 2025 to the left.. Pain radiates to the Left shoulder and down her left UE.Aaron Aas She has a history of bilateral breast cancer and is s/p bilateral mastectomies. Tissue expanders initially placed and implant exchange done but removed due to poor healing. She presents with significant pain, left greater than right chest, with some radiation to left upper arm, and limitations in  shoulder ROM left greater than right. Quick dash score of 36% disability. She will benefit from skilled PT to address deficits and return to PLOF   OBJECTIVE IMPAIRMENTS: decreased activity tolerance, decreased knowledge of condition, decreased ROM, decreased strength, increased fascial restrictions, impaired flexibility, impaired UE functional use, postural dysfunction, and pain.   ACTIVITY LIMITATIONS: carrying, sleeping, dressing, reach over head, and hygiene/grooming  PARTICIPATION LIMITATIONS: cleaning, laundry, yard work, and activities requiring significant use of arms  PERSONAL FACTORS: 3+ comorbidities: Multiple breast surgeries, chemo, radiation, pacemaker swithced right to left are also affecting patient's functional outcome.   REHAB POTENTIAL: Good  CLINICAL DECISION MAKING: Evolving/moderate complexity  EVALUATION COMPLEXITY: Moderate  GOALS: Goals reviewed with patient? Yes  SHORT TERM GOALS: Target date: 04/20/2024  Pt will be independent in a HEP for bilateral shoulder ROM/strength Baseline: Goal status: INITIAL  2.  Pt will have decreased chest/arm pain by 25% Baseline:  Goal status: INITIAL  3.  Pt will improve Shoulder AROM for Left flexion and  B abd  by 25-30 degrees Baseline:  Goal status: INITIAL   LONG TERM GOALS: Target date: 05/11/2024  Pt will have bilateral shoulder ROM for flexion and scaption to 125 degrees for improved reaching abiliy Baseline:  Goal status: INITIAL  2.  Pt will report  decreased chest and left UE pain by 50% or greater Baseline:  Goal status: INITIAL  3.  Quick dash will improve to no greater than 20% to demonstrate improved function Baseline:  Goal status: INITIAL  4.  Pt will have improved sleep by 50% or more to be more rested during the day Baseline:  Goal status: INITIAL   PLAN:  PT FREQUENCY: 2x/week  PT DURATION: 6 weeks  PLANNED INTERVENTIONS: 97164- PT Re-evaluation, 97750- Physical Performance Testing, 97110-Therapeutic exercises, 97530- Therapeutic activity, [id number]- Neuromuscular re-education, 97535- Self Care, 54098- Manual therapy, 20560 (1-2 muscles), 20561 (3+ muscles)- Dry Needling, and Patient/Family education  PLAN FOR NEXT SESSION: Review HEP, STM bilateral UT, axillary border of pecs, sternal border as appropriate,Careful on left due to Pacemaker. MFR to bilateral chest areas, Mastectomy scar massage,PROM, update HEP  Latisha Poland, PT 03/29/2024, 8:55 AM

## 2024-03-30 ENCOUNTER — Other Ambulatory Visit: Payer: Self-pay

## 2024-03-30 ENCOUNTER — Ambulatory Visit: Attending: Hematology and Oncology

## 2024-03-30 DIAGNOSIS — Z9013 Acquired absence of bilateral breasts and nipples: Secondary | ICD-10-CM | POA: Diagnosis not present

## 2024-03-30 DIAGNOSIS — R0789 Other chest pain: Secondary | ICD-10-CM | POA: Insufficient documentation

## 2024-03-30 DIAGNOSIS — Z171 Estrogen receptor negative status [ER-]: Secondary | ICD-10-CM

## 2024-03-30 DIAGNOSIS — Z853 Personal history of malignant neoplasm of breast: Secondary | ICD-10-CM | POA: Insufficient documentation

## 2024-03-30 DIAGNOSIS — R293 Abnormal posture: Secondary | ICD-10-CM | POA: Diagnosis not present

## 2024-03-30 DIAGNOSIS — M25512 Pain in left shoulder: Secondary | ICD-10-CM | POA: Diagnosis not present

## 2024-03-30 NOTE — Patient Instructions (Signed)
 SHOULDER: Flexion - Supine (Cane)        Cancer Rehab [phone]    Hold cane in both hands. Raise arms up overhead. Do not allow back to arch. Hold _5__ seconds. Do __5-10__ times; __2__ times a day.    Shoulder Blade Stretch    Clasp fingers behind head with elbows touching in front of face. Pull elbows back while pressing shoulder blades together. Relax and hold as tolerated, can place pillow under elbow here for comfort as needed and to allow for prolonged stretch.  Repeat __5__ times. Do __2__ sessions per day.      Copyright  VHI. All rights reserved.

## 2024-03-31 ENCOUNTER — Other Ambulatory Visit: Payer: Self-pay | Admitting: Cardiology

## 2024-03-31 DIAGNOSIS — I428 Other cardiomyopathies: Secondary | ICD-10-CM

## 2024-04-02 ENCOUNTER — Ambulatory Visit

## 2024-04-02 DIAGNOSIS — M25512 Pain in left shoulder: Secondary | ICD-10-CM | POA: Diagnosis not present

## 2024-04-02 DIAGNOSIS — Z9013 Acquired absence of bilateral breasts and nipples: Secondary | ICD-10-CM | POA: Diagnosis not present

## 2024-04-02 DIAGNOSIS — Z171 Estrogen receptor negative status [ER-]: Secondary | ICD-10-CM

## 2024-04-02 DIAGNOSIS — R0789 Other chest pain: Secondary | ICD-10-CM | POA: Diagnosis not present

## 2024-04-02 DIAGNOSIS — Z853 Personal history of malignant neoplasm of breast: Secondary | ICD-10-CM | POA: Diagnosis not present

## 2024-04-02 DIAGNOSIS — R293 Abnormal posture: Secondary | ICD-10-CM

## 2024-04-02 NOTE — Therapy (Signed)
 OUTPATIENT PHYSICAL THERAPY  UPPER EXTREMITY ONCOLOGY TREATMENT  Patient Name: Jordan Willis MRN: [id number] DOB:17-Sep-1951, 72 y.o., female Today's Date: 04/02/2024  END OF SESSION:  PT End of Session - 04/02/24 1221     Visit Number 2    Number of Visits 12    Date for PT Re-Evaluation 05/11/24    PT Start Time 1214   pt arrived late   PT Stop Time 1256    PT Time Calculation (min) 42 min    Activity Tolerance Patient tolerated treatment well    Behavior During Therapy WFL for tasks assessed/performed          Past Medical History:  Diagnosis Date   AICD (automatic cardioverter/defibrillator) present    Anemia    Anginal pain (HCC)    Anxiety    Arthritis    Lumbar spine DDD.   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   Diabetes mellitus without complication (HCC)    Type II   Dyspnea    When patient gets sick uses Pro-Air inhaler   Frequent sinus infections    GERD (gastroesophageal reflux disease)    occ   Goiter    History of cardiomegaly    History of cataract    Bilateral   History of kidney stones    08/28/2018 currently has a large kidney stone, asymptomatic at this time   History of migraine    History of uterine fibroid    History of uterine prolapse    Hyperlipidemia    Hypertension    LBBB (left bundle branch block)    Menopause 03/03/2012   Nasal turbinate hypertrophy 11/29/2016   Left inferior    Personal history of chemotherapy    Personal history of radiation therapy    Pneumonia    as a child   PONV (postoperative nausea and vomiting)    Sinusitis 2014   Sleep apnea    No CPAP   SUI (stress urinary incontinence, female)    SVD (spontaneous vaginal delivery)    x 2   Thyroid  nodule    Past Surgical History:  Procedure Laterality Date   ABDOMINAL  HYSTERECTOMY     BILATERAL SALPINGOOPHORECTOMY Bilateral    BIV ICD INSERTION CRT-D N/A 08/24/2022   Procedure: BIV ICD INSERTION CRT-D;  Surgeon: Tammie Fall, MD;  Location: [facility] INVASIVE CV LAB;  Service: Cardiovascular;  Laterality: N/A;   BIV ICD INSERTION CRT-D N/A 11/07/2023   Procedure: BIV ICD INSERTION CRT-D;  Surgeon: Tammie Fall, MD;  Location: [facility] INVASIVE CV LAB;  Service: Cardiovascular;  Laterality: N/A;   BLADDER SUSPENSION N/A 05/16/2017   Procedure: TRANSVAGINAL TAPE (TVT) PROCEDURE;  Surgeon: Renea Carrion, MD;  Location: [facility] ORS;  Service: Gynecology;  Laterality: N/A;   BREAST BIOPSY Right 2009   BREAST IMPLANT REMOVAL Bilateral 02/05/2022   Procedure: Removal bilateral breast implants;  Surgeon: Barb Bonito, MD;  Location: [facility] OR;  Service: Plastics;  Laterality: Bilateral;   BREAST LUMPECTOMY Right 2009   BREAST LUMPECTOMY Left 2006   BREAST RECONSTRUCTION WITH PLACEMENT OF TISSUE EXPANDER AND FLEX HD (ACELLULAR HYDRATED DERMIS) Bilateral 09/04/2021   Procedure: BILATERAL BREAST RECONSTRUCTION WITH PLACEMENT OF TISSUE EXPANDER AND FLEX HD (ACELLULAR HYDRATED DERMIS);  Surgeon: Barb Bonito, MD;  Location: [facility] OR;  Service: Government social research officer;  Laterality: Bilateral;   CARDIAC CATHETERIZATION     CATARACT EXTRACTION Left 10/23/2017   CATARACT EXTRACTION Right 11/06/2017   COLONOSCOPY  10/22/2009   Normal.  Repeat 5 years.  [name]   COLONOSCOPY WITH PROPOFOL  N/A 12/21/2022   Procedure: COLONOSCOPY WITH PROPOFOL ;  Surgeon: Alvis Jourdain, MD;  Location: [facility];  Service: Gastroenterology;  Laterality: N/A;   CYSTOSCOPY N/A 05/16/2017   Procedure: CYSTOSCOPY;  Surgeon: Renea Carrion, MD;  Location: [facility] ORS;  Service: Gynecology;  Laterality: N/A;   DEBRIDEMENT AND CLOSURE WOUND Bilateral 10/03/2021   Procedure: Debridement bilateral mastectomy flaps;  Surgeon: Barb Bonito, MD;  Location: [facility] OR;  Service: Plastics;  Laterality: Bilateral;  1.5 hour   EYE SURGERY      bilateral cataracts   LEAD EXTRACTION N/A 11/07/2023   Procedure: LEAD EXTRACTION;  Surgeon: Tammie Fall, MD;  Location: [facility] INVASIVE CV LAB;  Service: Cardiovascular;  Laterality: N/A;   LEFT HEART CATH AND CORONARY ANGIOGRAPHY N/A 02/09/2020   Procedure: LEFT HEART CATH AND CORONARY ANGIOGRAPHY;  Surgeon: Cody Das, MD;  Location: [facility] INVASIVE CV LAB;  Service: Cardiovascular;  Laterality: N/A;   LEFT HEART CATH AND CORONARY ANGIOGRAPHY N/A 03/27/2022   Procedure: LEFT HEART CATH AND CORONARY ANGIOGRAPHY;  Surgeon: Knox Perl, MD;  Location: [facility] INVASIVE CV LAB;  Service: Cardiovascular;  Laterality: N/A;   MASS EXCISION Right 12/04/2022   Procedure: EXCISION OF RIGHT CHEST WALL MASS;  Surgeon: Lockie Rima, MD;  Location: [facility] OR;  Service: General;  Laterality: Right;   MASTECTOMY W/ SENTINEL NODE BIOPSY Right 09/04/2021   Procedure: BILATERAL MASTECTOMIES WITH RIGHT SENTINEL LYMPH NODE BIOPSY;  Surgeon: Lockie Rima, MD;  Location: [facility] OR;  Service: General;  Laterality: Right;   REMOVAL OF BILATERAL TISSUE EXPANDERS WITH PLACEMENT OF BILATERAL BREAST IMPLANTS Bilateral 10/03/2021   Procedure: REMOVAL OF BILATERAL TISSUE EXPANDERS WITH REPLACEMENT OF TISSUE EXPANDERS;  Surgeon: Barb Bonito, MD;  Location: [facility] OR;  Service: Plastics;  Laterality: Bilateral;   REMOVAL OF BILATERAL TISSUE EXPANDERS WITH PLACEMENT OF BILATERAL BREAST IMPLANTS Bilateral 12/19/2021   Procedure: REMOVAL OF BILATERAL TISSUE EXPANDERS WITH PLACEMENT OF BILATERAL BREAST IMPLANTS;  Surgeon: Barb Bonito, MD;  Location: [facility] OR;  Service: Plastics;  Laterality: Bilateral;   ROBOTIC ASSISTED TOTAL HYSTERECTOMY N/A 05/16/2017   Procedure: ROBOTIC ASSISTED TOTAL HYSTERECTOMY;  Surgeon: Ona Bidding, MD;  Location: [facility] OR[facility];  Service: Gynecology;  Laterality: N/A;   ROTATOR CUFF REPAIR Bilateral    SCAR REVISION N/A 12/04/2022   Procedure: REVISION OF MASTECTOMY SCAR;  Surgeon: Lockie Rima, MD;  Location: [facility]  OR;  Service: General;  Laterality: N/A;   SHOULDER ARTHROSCOPY WITH ROTATOR CUFF REPAIR AND SUBACROMIAL DECOMPRESSION Right 09/03/2018   Procedure: Right shoulder manipulation under anesthesia, exam under anesthesia, mini open rotator cuff repair, subacromial decompression;  Surgeon: Orvan Blanch, MD;  Location: [facility] ORS;  Service: Orthopedics;  Laterality: Right;  90 mins   TRANSESOPHAGEAL ECHOCARDIOGRAM (CATH LAB) N/A 11/07/2023   Procedure: TRANSESOPHAGEAL ECHOCARDIOGRAM;  Surgeon: Tammie Fall, MD;  Location: [facility] INVASIVE CV LAB;  Service: Cardiovascular;  Laterality: N/A;   TUBAL LIGATION     WISDOM TOOTH EXTRACTION     Patient Active Problem List   Diagnosis Date Noted   Chronic systolic heart failure (HCC) 11/07/2023   ICD (implantable cardioverter-defibrillator) in place 12/25/2022   Bradycardia 08/24/2022   BRCA2 gene mutation positive in female 07/30/2022   LBBB (left bundle branch block)  Chronic diastolic heart failure (HCC)    NICM (nonischemic cardiomyopathy) (HCC)    Breast cancer of upper-outer quadrant of right female breast (HCC) 09/04/2021   Diabetic neuropathy (HCC) 02/08/2021   Angina pectoris (HCC) 02/09/2020   Abnormal stress test 02/09/2020   Complete rotator cuff tear 09/03/2018   Pelvic prolapse 05/16/2017   Intractable chronic post-traumatic headache 11/19/2016   Malignant neoplasm of upper-outer quadrant of left breast in female, estrogen receptor negative (HCC) 03/03/2012   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   Menopause 03/03/2012    PCP: Imelda Man, MD  REFERRING PROVIDER: Cameron Cea, MD  REFERRING DIAG: s/p breast Cancer/ shoulder pain  THERAPY DIAG:  History of bilateral mastectomy  Malignant neoplasm of upper-outer quadrant of left breast in female, estrogen receptor negative (HCC)  Left shoulder pain, unspecified chronicity  Abnormal posture  Muscular chest pain  ONSET DATE: 11/07/2023  Rationale for  Evaluation and Treatment: Rehabilitation  SUBJECTIVE:                                                                                                                                                                                           SUBJECTIVE STATEMENT:  I've tried the exercises Corbin Dess gave me last time. They are okay for a while but then my pacemaker site starts hurting so I stop.   PERTINENT HISTORY:   Pt was diagnosed with right breast cancer dx 05/2021. She has BRCA2 genetic mutation and has had left breast cancer in 2006 with chemo/breast conservation and XRT. She then had right breast cancer (dcis)  in 2009 with lumpectomy and radiation only. In 2022 she presented  with a palpable painful mass in the upper outer right breast. Dx imaging showed a 1.2 cm mass at 10 o'clock. 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  Pt underwent bilateral mastectomies with right sentinel node biopsy and expander reconstruction (Pace) 09/04/21. She had 6 negative nodes and negative margins. Patient ended up having the expanders removed 01/26/2022 because of continued drainage and discomfort.  2.14.2024 : revision of left mastectomy scar and removal of mass on right lateral mastectomy scar. Core needle biopsy was performed and this demonstrated invasive ductal carcinoma, grade 3. This was weakly ER positive, PR negative, HER2 positive, Ki-67 70%. She had 11 cycles of Breast Maintenane Trastuzumab    Pt also has a Investment banker, corporate.  She has a decrease ejection fraction of 20- 30% .   PAIN:  Are you having pain? Yes NPRS scale: 7/10 present on left and 5/10 on right Pain location: bilateral chest greater on left and left upper arm  Pain orientation: Bilateral  PAIN TYPE: aching, dull, sharp, and tight Pain description: constant on the left chest, but can be sharp, intermittent left arm pain Aggravating factors: sleeping on left, reaching Relieving factors: massaging,  topical, ibuprofen   PRECAUTIONS: at risk for lymphedema , pacemaker, defibrillator, DM, Right RTC repair 2019, cardiomyopathy, LV EF 20-25%  RED FLAGS: None   WEIGHT BEARING RESTRICTIONS: No  FALLS:  Has patient fallen in last 6 months? No  LIVING ENVIRONMENT: Lives with: pts mom lives with her and her daughter temporarily Lives in: House/apartment  OCCUPATION: does not work  LEISURE: walks 3 days /week, can walk up to 2 miles with breaks  HAND DOMINANCE: right   PRIOR LEVEL OF FUNCTION: Independent  PATIENT GOALS: Decrease pain, improve ROM   OBJECTIVE: Note: Objective measures were completed at Evaluation unless otherwise noted.  COGNITION: Overall cognitive status: Within functional limits for tasks assessed   PALPATION: Tender bilateral UT, pectorals, incision areas from pacemaker B  OBSERVATIONS / OTHER ASSESSMENTS: Bilateral mastectomy incisions healed but very restricted, keloid scar right chest from pacemaker removal, and new keloid scar on left chest from new pacemaker.defibrillator placement.  SENSATION: Light touch: Deficits     POSTURE: forward head rounded shoulders   UPPER EXTREMITY AROM/PROM:  A/PROM RIGHT   eval   Shoulder extension 48  Shoulder flexion 127(pulls chest)  Shoulder abduction 110, pain pulling chest  Shoulder internal rotation   Shoulder external rotation     (Blank rows = not tested)  A/PROM LEFT   eval  Shoulder extension 40, tight  Shoulder flexion 65 (tight/pain chest)  Shoulder abduction 70 ( tight chest)  Shoulder internal rotation   Shoulder external rotation     (Blank rows = not tested)  CERVICAL AROM: All within functional limits: Limited 40-50% with Bilateral rotation and SB     UPPER EXTREMITY STRENGTH:   LYMPHEDEMA ASSESSMENTS:   SURGERY TYPE/DATE: Multiple surgeries 2006, 2013 2022, 2024 B.Mastectomies with reconstruction, Implants removed 2023 due to non healing wound,  NUMBER OF LYMPH NODES  REMOVED: 6 right  CHEMOTHERAPY: YES, ongoing  RADIATION:YEs  HORMONE TREATMENT: Yes, Anastrozole   INFECTIONS: non healing wound requiring removal of implants    LYMPHEDEMA ASSESSMENTS:   LANDMARK RIGHT  eval  At axilla    15 cm proximal to olecranon process   10 cm proximal to olecranon process   Olecranon process   15 cm proximal to ulnar styloid process   10 cm proximal to ulnar styloid process   Just proximal to ulnar styloid process   Across hand at thumb web space   At base of 2nd digit   (Blank rows = not tested)  LANDMARK LEFT  eval  At axilla    15 cm proximal to olecranon process   10 cm proximal to olecranon process   Olecranon process   15 cm proximal to ulnar styloid process   10 cm proximal to ulnar styloid process   Just proximal to ulnar styloid process   Across hand at thumb web space   At base of 2nd digit   (Blank rows = not tested)   FUNCTIONAL TESTS:    GAIT: WNL   QUICK DASH SURVEY: 36.36  TREATMENT DATE:  04/02/24: Therapeutic Exercises Pulleys into flex x 2 mins and abd x 3 mins with pt returning therapist demo and VC's during to relax shoulders. Pt reported starting to feel increased discomfort at pacemaker site Manual Therapy P/ROM in supine to bil shoulders into flex, abd and D2, carefully on Lt side due to pacemaker, scapular depression by therapist throughout STM over areas of mastectomy and Rt pacemaker scar tissue but avoided area of current pace maker placement on Lt  03/30/2024 Discussed POC, LOS, treatment interventions.. Instructed in supine wand exs for flexion and scaption x 5 ea and supine stargazer x 5. Updated HEP with illustrated and written instructions, also showed scapular retraction, but forgot to give pic. Discussed new charging rule for DN.   PATIENT EDUCATION:  Education details: supine wand  exs for flexion and scaption x 5 ea and supine stargazer. Person educated: Patient Education method: Explanation, Demonstration, and Handouts Education comprehension: verbalized understanding and returned demonstration  HOME EXERCISE PROGRAM: supine wand exs for flexion and scaption x 5 ea and supine stargazer x 5 to perform 2x/day  ASSESSMENT:  CLINICAL IMPRESSION: Began AA/ROM with pulleys today. Pt start feeling discomfort towards end of time so spent rest of session working on manual therapy.    OBJECTIVE IMPAIRMENTS: decreased activity tolerance, decreased knowledge of condition, decreased ROM, decreased strength, increased fascial restrictions, impaired flexibility, impaired UE functional use, postural dysfunction, and pain.   ACTIVITY LIMITATIONS: carrying, sleeping, dressing, reach over head, and hygiene/grooming  PARTICIPATION LIMITATIONS: cleaning, laundry, yard work, and activities requiring significant use of arms  PERSONAL FACTORS: 3+ comorbidities: Multiple breast surgeries, chemo, radiation, pacemaker swithced right to left are also affecting patient's functional outcome.   REHAB POTENTIAL: Good  CLINICAL DECISION MAKING: Evolving/moderate complexity  EVALUATION COMPLEXITY: Moderate  GOALS: Goals reviewed with patient? Yes  SHORT TERM GOALS: Target date: 04/20/2024  Pt will be independent in a HEP for bilateral shoulder ROM/strength Baseline: Goal status: INITIAL  2.  Pt will have decreased chest/arm pain by 25% Baseline:  Goal status: INITIAL  3.  Pt will improve Shoulder AROM for Left flexion and  B abd  by 25-30 degrees Baseline:  Goal status: INITIAL   LONG TERM GOALS: Target date: 05/11/2024  Pt will have bilateral shoulder ROM for flexion and scaption to 125 degrees for improved reaching abiliy Baseline:  Goal status: INITIAL  2.  Pt will report decreased chest and left UE pain by 50% or greater Baseline:  Goal status: INITIAL  3.  Quick dash  will improve to no greater than 20% to demonstrate improved function Baseline:  Goal status: INITIAL  4.  Pt will have improved sleep by 50% or more to be more rested during the day Baseline:  Goal status: INITIAL   PLAN:  PT FREQUENCY: 2x/week  PT DURATION: 6 weeks  PLANNED INTERVENTIONS: 97164- PT Re-evaluation, 97750- Physical Performance Testing, 97110-Therapeutic exercises, 97530- Therapeutic activity, [id number]- Neuromuscular re-education, 97535- Self Care, 16109- Manual therapy, 20560 (1-2 muscles), 20561 (3+ muscles)- Dry Needling, and Patient/Family education  PLAN FOR NEXT SESSION: Review HEP, STM bilateral UT, axillary border of pecs, sternal border as appropriate,Careful on left due to Pacemaker. MFR to bilateral chest areas, Mastectomy scar massage,PROM, update HEP  Denyce Flank, PTA 04/02/2024, 1:07 PM

## 2024-04-03 ENCOUNTER — Ambulatory Visit: Admitting: Rehabilitation

## 2024-04-06 ENCOUNTER — Encounter

## 2024-04-08 ENCOUNTER — Ambulatory Visit

## 2024-04-08 DIAGNOSIS — Z9013 Acquired absence of bilateral breasts and nipples: Secondary | ICD-10-CM | POA: Diagnosis not present

## 2024-04-08 DIAGNOSIS — R0789 Other chest pain: Secondary | ICD-10-CM | POA: Diagnosis not present

## 2024-04-08 DIAGNOSIS — M25512 Pain in left shoulder: Secondary | ICD-10-CM

## 2024-04-08 DIAGNOSIS — Z853 Personal history of malignant neoplasm of breast: Secondary | ICD-10-CM | POA: Diagnosis not present

## 2024-04-08 DIAGNOSIS — R293 Abnormal posture: Secondary | ICD-10-CM

## 2024-04-08 DIAGNOSIS — Z171 Estrogen receptor negative status [ER-]: Secondary | ICD-10-CM

## 2024-04-08 NOTE — Therapy (Signed)
 OUTPATIENT PHYSICAL THERAPY  UPPER EXTREMITY ONCOLOGY TREATMENT  Patient Name: Jordan Willis MRN: [id number] DOB:08/29/1951, 73 y.o., female Today's Date: 04/08/2024  END OF SESSION:  PT End of Session - 04/08/24 1606     Visit Number 3    Number of Visits 12    Date for PT Re-Evaluation 05/11/24    PT Start Time 1606    PT Stop Time 1700    PT Time Calculation (min) 54 min    Activity Tolerance Patient tolerated treatment well    Behavior During Therapy WFL for tasks assessed/performed          Past Medical History:  Diagnosis Date   AICD (automatic cardioverter/defibrillator) present    Anemia    Anginal pain (HCC)    Anxiety    Arthritis    Lumbar spine DDD.   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   Diabetes mellitus without complication (HCC)    Type II   Dyspnea    When patient gets sick uses Pro-Air inhaler   Frequent sinus infections    GERD (gastroesophageal reflux disease)    occ   Goiter    History of cardiomegaly    History of cataract    Bilateral   History of kidney stones    08/28/2018 currently has a large kidney stone, asymptomatic at this time   History of migraine    History of uterine fibroid    History of uterine prolapse    Hyperlipidemia    Hypertension    LBBB (left bundle branch block)    Menopause 03/03/2012   Nasal turbinate hypertrophy 11/29/2016   Left inferior    Personal history of chemotherapy    Personal history of radiation therapy    Pneumonia    as a child   PONV (postoperative nausea and vomiting)    Sinusitis 2014   Sleep apnea    No CPAP   SUI (stress urinary incontinence, female)    SVD (spontaneous vaginal delivery)    x 2   Thyroid  nodule    Past Surgical History:  Procedure Laterality Date   ABDOMINAL HYSTERECTOMY      BILATERAL SALPINGOOPHORECTOMY Bilateral    BIV ICD INSERTION CRT-D N/A 08/24/2022   Procedure: BIV ICD INSERTION CRT-D;  Surgeon: Tammie Fall, MD;  Location: [facility] INVASIVE CV LAB;  Service: Cardiovascular;  Laterality: N/A;   BIV ICD INSERTION CRT-D N/A 11/07/2023   Procedure: BIV ICD INSERTION CRT-D;  Surgeon: Tammie Fall, MD;  Location: [facility] INVASIVE CV LAB;  Service: Cardiovascular;  Laterality: N/A;   BLADDER SUSPENSION N/A 05/16/2017   Procedure: TRANSVAGINAL TAPE (TVT) PROCEDURE;  Surgeon: Renea Carrion, MD;  Location: [facility] ORS;  Service: Gynecology;  Laterality: N/A;   BREAST BIOPSY Right 2009   BREAST IMPLANT REMOVAL Bilateral 02/05/2022   Procedure: Removal bilateral breast implants;  Surgeon: Barb Bonito, MD;  Location: [facility] OR;  Service: Plastics;  Laterality: Bilateral;   BREAST LUMPECTOMY Right 2009   BREAST LUMPECTOMY Left 2006   BREAST RECONSTRUCTION WITH PLACEMENT OF TISSUE EXPANDER AND FLEX HD (ACELLULAR HYDRATED DERMIS) Bilateral 09/04/2021   Procedure: BILATERAL BREAST RECONSTRUCTION WITH PLACEMENT OF TISSUE EXPANDER AND FLEX HD (ACELLULAR HYDRATED DERMIS);  Surgeon: Barb Bonito, MD;  Location: [facility] OR;  Service: Plastics;  Laterality:  Bilateral;   CARDIAC CATHETERIZATION     CATARACT EXTRACTION Left 10/23/2017   CATARACT EXTRACTION Right 11/06/2017   COLONOSCOPY  10/22/2009   Normal.  Repeat 5 years.  [name]   COLONOSCOPY WITH PROPOFOL  N/A 12/21/2022   Procedure: COLONOSCOPY WITH PROPOFOL ;  Surgeon: Alvis Jourdain, MD;  Location: [facility]OSCOPY;  Service: Gastroenterology;  Laterality: N/A;   CYSTOSCOPY N/A 05/16/2017   Procedure: CYSTOSCOPY;  Surgeon: Renea Carrion, MD;  Location: [facility] ORS;  Service: Gynecology;  Laterality: N/A;   DEBRIDEMENT AND CLOSURE WOUND Bilateral 10/03/2021   Procedure: Debridement bilateral mastectomy flaps;  Surgeon: Barb Bonito, MD;  Location: [facility] OR;  Service: Plastics;  Laterality: Bilateral;  1.5 hour   EYE SURGERY     bilateral  cataracts   LEAD EXTRACTION N/A 11/07/2023   Procedure: LEAD EXTRACTION;  Surgeon: Tammie Fall, MD;  Location: [facility] INVASIVE CV LAB;  Service: Cardiovascular;  Laterality: N/A;   LEFT HEART CATH AND CORONARY ANGIOGRAPHY N/A 02/09/2020   Procedure: LEFT HEART CATH AND CORONARY ANGIOGRAPHY;  Surgeon: Cody Das, MD;  Location: [facility] INVASIVE CV LAB;  Service: Cardiovascular;  Laterality: N/A;   LEFT HEART CATH AND CORONARY ANGIOGRAPHY N/A 03/27/2022   Procedure: LEFT HEART CATH AND CORONARY ANGIOGRAPHY;  Surgeon: Knox Perl, MD;  Location: [facility] INVASIVE CV LAB;  Service: Cardiovascular;  Laterality: N/A;   MASS EXCISION Right 12/04/2022   Procedure: EXCISION OF RIGHT CHEST WALL MASS;  Surgeon: Lockie Rima, MD;  Location: [facility] OR;  Service: General;  Laterality: Right;   MASTECTOMY W/ SENTINEL NODE BIOPSY Right 09/04/2021   Procedure: BILATERAL MASTECTOMIES WITH RIGHT SENTINEL LYMPH NODE BIOPSY;  Surgeon: Lockie Rima, MD;  Location: [facility] OR;  Service: General;  Laterality: Right;   REMOVAL OF BILATERAL TISSUE EXPANDERS WITH PLACEMENT OF BILATERAL BREAST IMPLANTS Bilateral 10/03/2021   Procedure: REMOVAL OF BILATERAL TISSUE EXPANDERS WITH REPLACEMENT OF TISSUE EXPANDERS;  Surgeon: Barb Bonito, MD;  Location: [facility] OR;  Service: Plastics;  Laterality: Bilateral;   REMOVAL OF BILATERAL TISSUE EXPANDERS WITH PLACEMENT OF BILATERAL BREAST IMPLANTS Bilateral 12/19/2021   Procedure: REMOVAL OF BILATERAL TISSUE EXPANDERS WITH PLACEMENT OF BILATERAL BREAST IMPLANTS;  Surgeon: Barb Bonito, MD;  Location: [facility] OR;  Service: Plastics;  Laterality: Bilateral;   ROBOTIC ASSISTED TOTAL HYSTERECTOMY N/A 05/16/2017   Procedure: ROBOTIC ASSISTED TOTAL HYSTERECTOMY;  Surgeon: Ona Bidding, MD;  Location: [facility] ORS;  Service: Gynecology;  Laterality: N/A;   ROTATOR CUFF REPAIR Bilateral    SCAR REVISION N/A 12/04/2022   Procedure: REVISION OF MASTECTOMY SCAR;  Surgeon: Lockie Rima, MD;  Location: [facility] OR;  Service:  General;  Laterality: N/A;   SHOULDER ARTHROSCOPY WITH ROTATOR CUFF REPAIR AND SUBACROMIAL DECOMPRESSION Right 09/03/2018   Procedure: Right shoulder manipulation under anesthesia, exam under anesthesia, mini open rotator cuff repair, subacromial decompression;  Surgeon: Orvan Blanch, MD;  Location: [facility] ORS;  Service: Orthopedics;  Laterality: Right;  90 mins   TRANSESOPHAGEAL ECHOCARDIOGRAM (CATH LAB) N/A 11/07/2023   Procedure: TRANSESOPHAGEAL ECHOCARDIOGRAM;  Surgeon: Tammie Fall, MD;  Location: [facility] INVASIVE CV LAB;  Service: Cardiovascular;  Laterality: N/A;   TUBAL LIGATION     WISDOM TOOTH EXTRACTION     Patient Active Problem List   Diagnosis Date Noted   Chronic systolic heart failure (HCC) 11/07/2023   ICD (implantable cardioverter-defibrillator) in place 12/25/2022   Bradycardia 08/24/2022   BRCA2 gene mutation positive in female 07/30/2022   LBBB (left bundle branch block)    Chronic diastolic heart failure (  HCC)    NICM (nonischemic cardiomyopathy) (HCC)    Breast cancer of upper-outer quadrant of right female breast (HCC) 09/04/2021   Diabetic neuropathy (HCC) 02/08/2021   Angina pectoris (HCC) 02/09/2020   Abnormal stress test 02/09/2020   Complete rotator cuff tear 09/03/2018   Pelvic prolapse 05/16/2017   Intractable chronic post-traumatic headache 11/19/2016   Malignant neoplasm of upper-outer quadrant of left breast in female, estrogen receptor negative (HCC) 03/03/2012   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   Menopause 03/03/2012    PCP: Imelda Man, MD  REFERRING PROVIDER: Cameron Cea, MD  REFERRING DIAG: s/p breast Cancer/ shoulder pain  THERAPY DIAG:  History of bilateral mastectomy  Malignant neoplasm of upper-outer quadrant of left breast in female, estrogen receptor negative (HCC)  Left shoulder pain, unspecified chronicity  Abnormal posture  Muscular chest pain  ONSET DATE: 11/07/2023  Rationale for Evaluation and  Treatment: Rehabilitation  SUBJECTIVE:                                                                                                                                                                                           SUBJECTIVE STATEMENT:  I came here on Thursday and Friday I went to [facility], and came back on Monday so I haven't done many of my exs    PERTINENT HISTORY:   Pt was diagnosed with right breast cancer dx 05/2021. She has BRCA2 genetic mutation and has had left breast cancer in 2006 with chemo/breast conservation and XRT. She then had right breast cancer (dcis)  in 2009 with lumpectomy and radiation only. In 2022 she presented  with a palpable painful mass in the upper outer right breast. Dx imaging showed a 1.2 cm mass at 10 o'clock. 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  Pt underwent bilateral mastectomies with right sentinel node biopsy and expander reconstruction (Pace) 09/04/21. She had 6 negative nodes and negative margins. Patient ended up having the expanders removed 01/26/2022 because of continued drainage and discomfort.  2.14.2024 : revision of left mastectomy scar and removal of mass on right lateral mastectomy scar. Core needle biopsy was performed and this demonstrated invasive ductal carcinoma, grade 3. This was weakly ER positive, PR negative, HER2 positive, Ki-67 70%. She had 11 cycles of Breast Maintenane Trastuzumab    Pt also has a Investment banker, corporate.  She has a decrease ejection fraction of 20- 30% .   PAIN:  Are you having pain? Yes NPRS scale: 7/10 present on left and 5/10 on right Pain location: bilateral chest greater on left and left upper arm Pain orientation: Bilateral  PAIN TYPE: aching, dull, sharp, and tight Pain description: constant on the left chest, but can be sharp, intermittent left arm pain Aggravating factors: sleeping on left, reaching Relieving factors: massaging, topical, ibuprofen   PRECAUTIONS: at  risk for lymphedema , pacemaker, defibrillator, DM, Right RTC repair 2019, cardiomyopathy, LV EF 20-25%  RED FLAGS: None   WEIGHT BEARING RESTRICTIONS: No  FALLS:  Has patient fallen in last 6 months? No  LIVING ENVIRONMENT: Lives with: pts mom lives with her and her daughter temporarily Lives in: House/apartment  OCCUPATION: does not work  LEISURE: walks 3 days /week, can walk up to 2 miles with breaks  HAND DOMINANCE: right   PRIOR LEVEL OF FUNCTION: Independent  PATIENT GOALS: Decrease pain, improve ROM   OBJECTIVE: Note: Objective measures were completed at Evaluation unless otherwise noted.  COGNITION: Overall cognitive status: Within functional limits for tasks assessed   PALPATION: Tender bilateral UT, pectorals, incision areas from pacemaker B  OBSERVATIONS / OTHER ASSESSMENTS: Bilateral mastectomy incisions healed but very restricted, keloid scar right chest from pacemaker removal, and new keloid scar on left chest from new pacemaker.defibrillator placement.  SENSATION: Light touch: Deficits     POSTURE: forward head rounded shoulders   UPPER EXTREMITY AROM/PROM:  A/PROM RIGHT   eval   Shoulder extension 48  Shoulder flexion 127(pulls chest)  Shoulder abduction 110, pain pulling chest  Shoulder internal rotation   Shoulder external rotation     (Blank rows = not tested)  A/PROM LEFT   eval  Shoulder extension 40, tight  Shoulder flexion 65 (tight/pain chest)  Shoulder abduction 70 ( tight chest)  Shoulder internal rotation   Shoulder external rotation     (Blank rows = not tested)  CERVICAL AROM: All within functional limits: Limited 40-50% with Bilateral rotation and SB     UPPER EXTREMITY STRENGTH:   LYMPHEDEMA ASSESSMENTS:   SURGERY TYPE/DATE: Multiple surgeries 2006, 2013 2022, 2024 B.Mastectomies with reconstruction, Implants removed 2023 due to non healing wound,  NUMBER OF LYMPH NODES REMOVED: 6 right  CHEMOTHERAPY: YES,  ongoing  RADIATION:YEs  HORMONE TREATMENT: Yes, Anastrozole   INFECTIONS: non healing wound requiring removal of implants    LYMPHEDEMA ASSESSMENTS:   LANDMARK RIGHT  eval  At axilla    15 cm proximal to olecranon process   10 cm proximal to olecranon process   Olecranon process   15 cm proximal to ulnar styloid process   10 cm proximal to ulnar styloid process   Just proximal to ulnar styloid process   Across hand at thumb web space   At base of 2nd digit   (Blank rows = not tested)  LANDMARK LEFT  eval  At axilla    15 cm proximal to olecranon process   10 cm proximal to olecranon process   Olecranon process   15 cm proximal to ulnar styloid process   10 cm proximal to ulnar styloid process   Just proximal to ulnar styloid process   Across hand at thumb web space   At base of 2nd digit   (Blank rows = not tested)   FUNCTIONAL TESTS:    GAIT: WNL   QUICK DASH SURVEY: 36.36  TREATMENT DATE:  04/08/2024 B Scapular retraction x 5 Bilateral shoulder abd x 4 with wall slides Supine wand flexion and scaption x 3, 5 sec hold with exhale at the end [facility] angels x 5 B, 5 sec hold MFR to bilateral chest regions avoiding pacemaker on left PROM bilateral shoulder flex, scaption, abd, IR and ER with VC's prn to relax 04/02/24: Therapeutic Exercises Pulleys into flex x 2 mins and abd x 3 mins with pt returning therapist demo and VC's during to relax shoulders. Pt reported starting to feel increased discomfort at pacemaker site Manual Therapy P/ROM in supine to bil shoulders into flex, abd and D2, carefully on Lt side due to pacemaker, scapular depression by therapist throughout STM over areas of mastectomy and Rt pacemaker scar tissue but avoided area of current pace maker placement on Lt  03/30/2024 Discussed POC, LOS, treatment interventions..  Instructed in supine wand exs for flexion and scaption x 5 ea and supine stargazer x 5. Updated HEP with illustrated and written instructions, also showed scapular retraction, but forgot to give pic. Discussed new charging rule for DN.   PATIENT EDUCATION:  Education details: supine wand exs for flexion and scaption x 5 ea and supine stargazer. Person educated: Patient Education method: Explanation, Demonstration, and Handouts Education comprehension: verbalized understanding and returned demonstration  HOME EXERCISE PROGRAM: supine wand exs for flexion and scaption x 5 ea and supine stargazer x 5 to perform 2x/day  ASSESSMENT:  CLINICAL IMPRESSION: Pt did well with supine wand exs and got good increase in motion with a deep exhale to relax. She requires occasional VC's to relax for PROM, but is visibly improved. Chest muscles still very tight with supine snow angels but no shoulder pain. She felt good after treatment today.    OBJECTIVE IMPAIRMENTS: decreased activity tolerance, decreased knowledge of condition, decreased ROM, decreased strength, increased fascial restrictions, impaired flexibility, impaired UE functional use, postural dysfunction, and pain.   ACTIVITY LIMITATIONS: carrying, sleeping, dressing, reach over head, and hygiene/grooming  PARTICIPATION LIMITATIONS: cleaning, laundry, yard work, and activities requiring significant use of arms  PERSONAL FACTORS: 3+ comorbidities: Multiple breast surgeries, chemo, radiation, pacemaker swithced right to left are also affecting patient's functional outcome.   REHAB POTENTIAL: Good  CLINICAL DECISION MAKING: Evolving/moderate complexity  EVALUATION COMPLEXITY: Moderate  GOALS: Goals reviewed with patient? Yes  SHORT TERM GOALS: Target date: 04/20/2024  Pt will be independent in a HEP for bilateral shoulder ROM/strength Baseline: Goal status: INITIAL  2.  Pt will have decreased chest/arm pain by 25% Baseline:  Goal  status: INITIAL  3.  Pt will improve Shoulder AROM for Left flexion and  B abd  by 25-30 degrees Baseline:  Goal status: INITIAL   LONG TERM GOALS: Target date: 05/11/2024  Pt will have bilateral shoulder ROM for flexion and scaption to 125 degrees for improved reaching abiliy Baseline:  Goal status: INITIAL  2.  Pt will report decreased chest and left UE pain by 50% or greater Baseline:  Goal status: INITIAL  3.  Quick dash will improve to no greater than 20% to demonstrate improved function Baseline:  Goal status: INITIAL  4.  Pt will have improved sleep by 50% or more to be more rested during the day Baseline:  Goal status: INITIAL   PLAN:  PT FREQUENCY: 2x/week  PT DURATION: 6 weeks  PLANNED INTERVENTIONS: 97164- PT Re-evaluation, 97750- Physical Performance Testing, 97110-Therapeutic exercises, 97530- Therapeutic activity, [id number]- Neuromuscular re-education, 97535- Self Care, 16109- Manual therapy, 20560 (1-2  muscles), 20561 (3+ muscles)- Dry Needling, and Patient/Family education  PLAN FOR NEXT SESSION: Review HEP, STM bilateral UT, axillary border of pecs, sternal border as appropriate,Careful on left due to Pacemaker. MFR to bilateral chest areas, Mastectomy scar massage,PROM, update HEP  Latisha Poland, PT 04/08/2024, 5:05 PM

## 2024-04-13 ENCOUNTER — Ambulatory Visit

## 2024-04-13 DIAGNOSIS — R0789 Other chest pain: Secondary | ICD-10-CM

## 2024-04-13 DIAGNOSIS — R293 Abnormal posture: Secondary | ICD-10-CM

## 2024-04-13 DIAGNOSIS — Z853 Personal history of malignant neoplasm of breast: Secondary | ICD-10-CM | POA: Diagnosis not present

## 2024-04-13 DIAGNOSIS — Z9013 Acquired absence of bilateral breasts and nipples: Secondary | ICD-10-CM | POA: Diagnosis not present

## 2024-04-13 DIAGNOSIS — Z171 Estrogen receptor negative status [ER-]: Secondary | ICD-10-CM

## 2024-04-13 DIAGNOSIS — M25512 Pain in left shoulder: Secondary | ICD-10-CM | POA: Diagnosis not present

## 2024-04-13 NOTE — Therapy (Signed)
 OUTPATIENT PHYSICAL THERAPY  UPPER EXTREMITY ONCOLOGY TREATMENT  Patient Name: MARIKA MAHAFFY MRN: [id number] DOB:1950/11/22, 73 y.o., female Today's Date: 04/13/2024  END OF SESSION:  PT End of Session - 04/13/24 0959     Visit Number 4    Number of Visits 12    PT Start Time 1000    PT Stop Time 1057    PT Time Calculation (min) 57 min    Activity Tolerance Patient tolerated treatment well    Behavior During Therapy WFL for tasks assessed/performed          Past Medical History:  Diagnosis Date   AICD (automatic cardioverter/defibrillator) present    Anemia    Anginal pain (HCC)    Anxiety    Arthritis    Lumbar spine DDD.   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   Diabetes mellitus without complication (HCC)    Type II   Dyspnea    When patient gets sick uses Pro-Air inhaler   Frequent sinus infections    GERD (gastroesophageal reflux disease)    occ   Goiter    History of cardiomegaly    History of cataract    Bilateral   History of kidney stones    08/28/2018 currently has a large kidney stone, asymptomatic at this time   History of migraine    History of uterine fibroid    History of uterine prolapse    Hyperlipidemia    Hypertension    LBBB (left bundle branch block)    Menopause 03/03/2012   Nasal turbinate hypertrophy 11/29/2016   Left inferior    Personal history of chemotherapy    Personal history of radiation therapy    Pneumonia    as a child   PONV (postoperative nausea and vomiting)    Sinusitis 2014   Sleep apnea    No CPAP   SUI (stress urinary incontinence, female)    SVD (spontaneous vaginal delivery)    x 2   Thyroid  nodule    Past Surgical History:  Procedure Laterality Date   ABDOMINAL HYSTERECTOMY     BILATERAL SALPINGOOPHORECTOMY Bilateral     BIV ICD INSERTION CRT-D N/A 08/24/2022   Procedure: BIV ICD INSERTION CRT-D;  Surgeon: Waddell Danelle ORN, MD;  Location: [facility] INVASIVE CV LAB;  Service: Cardiovascular;  Laterality: N/A;   BIV ICD INSERTION CRT-D N/A 11/07/2023   Procedure: BIV ICD INSERTION CRT-D;  Surgeon: Waddell Danelle ORN, MD;  Location: [facility] INVASIVE CV LAB;  Service: Cardiovascular;  Laterality: N/A;   BLADDER SUSPENSION N/A 05/16/2017   Procedure: TRANSVAGINAL TAPE (TVT) PROCEDURE;  Surgeon: Henry Slough, MD;  Location: [facility] ORS;  Service: Gynecology;  Laterality: N/A;   BREAST BIOPSY Right 2009   BREAST IMPLANT REMOVAL Bilateral 02/05/2022   Procedure: Removal bilateral breast implants;  Surgeon: Elisabeth Craig RAMAN, MD;  Location: [facility] OR;  Service: Plastics;  Laterality: Bilateral;   BREAST LUMPECTOMY Right 2009   BREAST LUMPECTOMY Left 2006   BREAST RECONSTRUCTION WITH PLACEMENT OF TISSUE EXPANDER AND FLEX HD (ACELLULAR HYDRATED DERMIS) Bilateral 09/04/2021   Procedure: BILATERAL BREAST RECONSTRUCTION WITH PLACEMENT OF TISSUE EXPANDER AND FLEX HD (ACELLULAR HYDRATED DERMIS);  Surgeon: Elisabeth Craig RAMAN, MD;  Location: [facility] OR;  Service: Plastics;  Laterality: Bilateral;   CARDIAC CATHETERIZATION  CATARACT EXTRACTION Left 10/23/2017   CATARACT EXTRACTION Right 11/06/2017   COLONOSCOPY  10/22/2009   Normal.  Repeat 5 years.  [name]   COLONOSCOPY WITH PROPOFOL  N/A 12/21/2022   Procedure: COLONOSCOPY WITH PROPOFOL ;  Surgeon: Rollin Dover, MD;  Location: [facility];  Service: Gastroenterology;  Laterality: N/A;   CYSTOSCOPY N/A 05/16/2017   Procedure: CYSTOSCOPY;  Surgeon: Henry Slough, MD;  Location: [facility] ORS;  Service: Gynecology;  Laterality: N/A;   DEBRIDEMENT AND CLOSURE WOUND Bilateral 10/03/2021   Procedure: Debridement bilateral mastectomy flaps;  Surgeon: Elisabeth Craig RAMAN, MD;  Location: [facility] OR;  Service: Plastics;  Laterality: Bilateral;  1.5 hour   EYE SURGERY     bilateral cataracts   LEAD EXTRACTION N/A 11/07/2023    Procedure: LEAD EXTRACTION;  Surgeon: Waddell Danelle ORN, MD;  Location: [facility] INVASIVE CV LAB;  Service: Cardiovascular;  Laterality: N/A;   LEFT HEART CATH AND CORONARY ANGIOGRAPHY N/A 02/09/2020   Procedure: LEFT HEART CATH AND CORONARY ANGIOGRAPHY;  Surgeon: Elmira Newman PARAS, MD;  Location: [facility] INVASIVE CV LAB;  Service: Cardiovascular;  Laterality: N/A;   LEFT HEART CATH AND CORONARY ANGIOGRAPHY N/A 03/27/2022   Procedure: LEFT HEART CATH AND CORONARY ANGIOGRAPHY;  Surgeon: Ladona Heinz, MD;  Location: [facility] INVASIVE CV LAB;  Service: Cardiovascular;  Laterality: N/A;   MASS EXCISION Right 12/04/2022   Procedure: EXCISION OF RIGHT CHEST WALL MASS;  Surgeon: Aron Shoulders, MD;  Location: [facility] OR;  Service: General;  Laterality: Right;   MASTECTOMY W/ SENTINEL NODE BIOPSY Right 09/04/2021   Procedure: BILATERAL MASTECTOMIES WITH RIGHT SENTINEL LYMPH NODE BIOPSY;  Surgeon: Aron Shoulders, MD;  Location: [facility] OR;  Service: General;  Laterality: Right;   REMOVAL OF BILATERAL TISSUE EXPANDERS WITH PLACEMENT OF BILATERAL BREAST IMPLANTS Bilateral 10/03/2021   Procedure: REMOVAL OF BILATERAL TISSUE EXPANDERS WITH REPLACEMENT OF TISSUE EXPANDERS;  Surgeon: Elisabeth Craig RAMAN, MD;  Location: [facility] OR;  Service: Plastics;  Laterality: Bilateral;   REMOVAL OF BILATERAL TISSUE EXPANDERS WITH PLACEMENT OF BILATERAL BREAST IMPLANTS Bilateral 12/19/2021   Procedure: REMOVAL OF BILATERAL TISSUE EXPANDERS WITH PLACEMENT OF BILATERAL BREAST IMPLANTS;  Surgeon: Elisabeth Craig RAMAN, MD;  Location: [facility] OR;  Service: Plastics;  Laterality: Bilateral;   ROBOTIC ASSISTED TOTAL HYSTERECTOMY N/A 05/16/2017   Procedure: ROBOTIC ASSISTED TOTAL HYSTERECTOMY;  Surgeon: Darcel Pool, MD;  Location: [facility] ORS;  Service: Gynecology;  Laterality: N/A;   ROTATOR CUFF REPAIR Bilateral    SCAR REVISION N/A 12/04/2022   Procedure: REVISION OF MASTECTOMY SCAR;  Surgeon: Aron Shoulders, MD;  Location: [facility] OR;  Service: General;  Laterality: N/A;   SHOULDER  ARTHROSCOPY WITH ROTATOR CUFF REPAIR AND SUBACROMIAL DECOMPRESSION Right 09/03/2018   Procedure: Right shoulder manipulation under anesthesia, exam under anesthesia, mini open rotator cuff repair, subacromial decompression;  Surgeon: Duwayne Purchase, MD;  Location: [facility] ORS;  Service: Orthopedics;  Laterality: Right;  90 mins   TRANSESOPHAGEAL ECHOCARDIOGRAM (CATH LAB) N/A 11/07/2023   Procedure: TRANSESOPHAGEAL ECHOCARDIOGRAM;  Surgeon: Waddell Danelle ORN, MD;  Location: [facility] INVASIVE CV LAB;  Service: Cardiovascular;  Laterality: N/A;   TUBAL LIGATION     WISDOM TOOTH EXTRACTION     Patient Active Problem List   Diagnosis Date Noted   Chronic systolic heart failure (HCC) 11/07/2023   ICD (implantable cardioverter-defibrillator) in place 12/25/2022   Bradycardia 08/24/2022   BRCA2 gene mutation positive in female 07/30/2022   LBBB (left bundle branch block)    Chronic diastolic heart failure (HCC)    NICM (nonischemic cardiomyopathy) (HCC)  Breast cancer of upper-outer quadrant of right female breast (HCC) 09/04/2021   Diabetic neuropathy (HCC) 02/08/2021   Angina pectoris (HCC) 02/09/2020   Abnormal stress test 02/09/2020   Complete rotator cuff tear 09/03/2018   Pelvic prolapse 05/16/2017   Intractable chronic post-traumatic headache 11/19/2016   Malignant neoplasm of upper-outer quadrant of left breast in female, estrogen receptor negative (HCC) 03/03/2012   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   Menopause 03/03/2012    PCP: Ryan Hives, MD  REFERRING PROVIDER: Mackey Chad, MD  REFERRING DIAG: s/p breast Cancer/ shoulder pain  THERAPY DIAG:  History of bilateral mastectomy  Malignant neoplasm of upper-outer quadrant of left breast in female, estrogen receptor negative (HCC)  Left shoulder pain, unspecified chronicity  Abnormal posture  Muscular chest pain  ONSET DATE: 11/07/2023  Rationale for Evaluation and Treatment: Rehabilitation  SUBJECTIVE:                                                                                                                                                                                            SUBJECTIVE STATEMENT:  Did OK after last visit. Not much soreness. No real pain today maybe a 4-5/10. Left arm pain is much better.  PERTINENT HISTORY:   Pt was diagnosed with right breast cancer dx 05/2021. She has BRCA2 genetic mutation and has had left breast cancer in 2006 with chemo/breast conservation and XRT. She then had right breast cancer (dcis)  in 2009 with lumpectomy and radiation only. In 2022 she presented  with a palpable painful mass in the upper outer right breast. Dx imaging showed a 1.2 cm mass at 10 o'clock. 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  Pt underwent bilateral mastectomies with right sentinel node biopsy and expander reconstruction (Pace) 09/04/21. She had 6 negative nodes and negative margins. Patient ended up having the expanders removed 01/26/2022 because of continued drainage and discomfort.  2.14.2024 : revision of left mastectomy scar and removal of mass on right lateral mastectomy scar. Core needle biopsy was performed and this demonstrated invasive ductal carcinoma, grade 3. This was weakly ER positive, PR negative, HER2 positive, Ki-67 70%. She had 11 cycles of Breast Maintenane Trastuzumab    Pt also has a Investment banker, corporate.  She has a decrease ejection fraction of 20- 30% .   PAIN:  Are you having pain? Yes NPRS scale: 5/10 present on left and 4/10 on right Pain location: bilateral chest greater on left and left upper arm Pain orientation: Bilateral  PAIN TYPE: aching, dull, sharp, and tight Pain description: constant on the left chest, but can  be sharp, intermittent left arm pain Aggravating factors: sleeping on left, reaching Relieving factors: massaging, topical, ibuprofen   PRECAUTIONS: at risk for lymphedema , pacemaker,  defibrillator, DM, Right RTC repair 2019, cardiomyopathy, LV EF 20-25%  RED FLAGS: None   WEIGHT BEARING RESTRICTIONS: No  FALLS:  Has patient fallen in last 6 months? No  LIVING ENVIRONMENT: Lives with: pts mom lives with her and her daughter temporarily Lives in: House/apartment  OCCUPATION: does not work  LEISURE: walks 3 days /week, can walk up to 2 miles with breaks  HAND DOMINANCE: right   PRIOR LEVEL OF FUNCTION: Independent  PATIENT GOALS: Decrease pain, improve ROM   OBJECTIVE: Note: Objective measures were completed at Evaluation unless otherwise noted.  COGNITION: Overall cognitive status: Within functional limits for tasks assessed   PALPATION: Tender bilateral UT, pectorals, incision areas from pacemaker B  OBSERVATIONS / OTHER ASSESSMENTS: Bilateral mastectomy incisions healed but very restricted, keloid scar right chest from pacemaker removal, and new keloid scar on left chest from new pacemaker.defibrillator placement.  SENSATION: Light touch: Deficits     POSTURE: forward head rounded shoulders   UPPER EXTREMITY AROM/PROM:  A/PROM RIGHT   eval   Shoulder extension 48  Shoulder flexion 127(pulls chest)  Shoulder abduction 110, pain pulling chest  Shoulder internal rotation   Shoulder external rotation     (Blank rows = not tested)  A/PROM LEFT   eval  Shoulder extension 40, tight  Shoulder flexion 65 (tight/pain chest)  Shoulder abduction 70 ( tight chest)  Shoulder internal rotation   Shoulder external rotation     (Blank rows = not tested)  CERVICAL AROM: All within functional limits: Limited 40-50% with Bilateral rotation and SB     UPPER EXTREMITY STRENGTH:   LYMPHEDEMA ASSESSMENTS:   SURGERY TYPE/DATE: Multiple surgeries 2006, 2013 2022, 2024 B.Mastectomies with reconstruction, Implants removed 2023 due to non healing wound,  NUMBER OF LYMPH NODES REMOVED: 6 right  CHEMOTHERAPY: YES, ongoing  RADIATION:YEs  HORMONE  TREATMENT: Yes, Anastrozole   INFECTIONS: non healing wound requiring removal of implants    LYMPHEDEMA ASSESSMENTS:   LANDMARK RIGHT  eval  At axilla    15 cm proximal to olecranon process   10 cm proximal to olecranon process   Olecranon process   15 cm proximal to ulnar styloid process   10 cm proximal to ulnar styloid process   Just proximal to ulnar styloid process   Across hand at thumb web space   At base of 2nd digit   (Blank rows = not tested)  LANDMARK LEFT  eval  At axilla    15 cm proximal to olecranon process   10 cm proximal to olecranon process   Olecranon process   15 cm proximal to ulnar styloid process   10 cm proximal to ulnar styloid process   Just proximal to ulnar styloid process   Across hand at thumb web space   At base of 2nd digit   (Blank rows = not tested)   FUNCTIONAL TESTS:    GAIT: WNL   QUICK DASH SURVEY: 36.36  TREATMENT DATE:   04/13/2024 Supine wand flexion and scaption x 5, 5 sec hold with exhale at the end Star gazer x 5 Snow angels x 5 B, 5 sec hold MFR to bilateral chest regions avoiding pacemaker on left,  STM to bilateral UT, Right pectorals and bilateral lateral trunk with cocoa butter, scar massage left mastectomy incision PROM bilateral shoulder flex, scaption, abd, IR and ER with VC's prn to relax Showed SB stretch to stretch lateral trunk.   04/08/2024 B Scapular retraction x 5 Bilateral shoulder abd x 4 with wall slides Supine wand flexion and scaption x 3, 5 sec hold with exhale at the end [facility] angels x 5 B, 5 sec hold MFR to bilateral chest regions avoiding pacemaker on left PROM bilateral shoulder flex, scaption, abd, IR and ER with VC's prn to relax 04/02/24: Therapeutic Exercises Pulleys into flex x 2 mins and abd x 3 mins with pt returning therapist demo and VC's during to relax  shoulders. Pt reported starting to feel increased discomfort at pacemaker site Manual Therapy P/ROM in supine to bil shoulders into flex, abd and D2, carefully on Lt side due to pacemaker, scapular depression by therapist throughout STM over areas of mastectomy and Rt pacemaker scar tissue but avoided area of current pace maker placement on Lt  03/30/2024 Discussed POC, LOS, treatment interventions.. Instructed in supine wand exs for flexion and scaption x 5 ea and supine stargazer x 5. Updated HEP with illustrated and written instructions, also showed scapular retraction, but forgot to give pic. Discussed new charging rule for DN.   PATIENT EDUCATION:  Education details: supine wand exs for flexion and scaption x 5 ea and supine stargazer. Person educated: Patient Education method: Explanation, Demonstration, and Handouts Education comprehension: verbalized understanding and returned demonstration  HOME EXERCISE PROGRAM: supine wand exs for flexion and scaption x 5 ea and supine stargazer x 5 to perform 2x/day  ASSESSMENT:  CLINICAL IMPRESSION: Pt reports left arm pain overall much better, and she felt good after therapy today. Tightness still noted across bilateral chest . PROM visually  better than AAROM with wand and pt relaxed quite well.  OBJECTIVE IMPAIRMENTS: decreased activity tolerance, decreased knowledge of condition, decreased ROM, decreased strength, increased fascial restrictions, impaired flexibility, impaired UE functional use, postural dysfunction, and pain.   ACTIVITY LIMITATIONS: carrying, sleeping, dressing, reach over head, and hygiene/grooming  PARTICIPATION LIMITATIONS: cleaning, laundry, yard work, and activities requiring significant use of arms  PERSONAL FACTORS: 3+ comorbidities: Multiple breast surgeries, chemo, radiation, pacemaker swithced right to left are also affecting patient's functional outcome.   REHAB POTENTIAL: Good  CLINICAL DECISION MAKING:  Evolving/moderate complexity  EVALUATION COMPLEXITY: Moderate  GOALS: Goals reviewed with patient? Yes  SHORT TERM GOALS: Target date: 04/20/2024  Pt will be independent in a HEP for bilateral shoulder ROM/strength Baseline: Goal status: INITIAL  2.  Pt will have decreased chest/arm pain by 25% Baseline:  Goal status: INITIAL  3.  Pt will improve Shoulder AROM for Left flexion and  B abd  by 25-30 degrees Baseline:  Goal status: INITIAL   LONG TERM GOALS: Target date: 05/11/2024  Pt will have bilateral shoulder ROM for flexion and scaption to 125 degrees for improved reaching abiliy Baseline:  Goal status: INITIAL  2.  Pt will report decreased chest and left UE pain by 50% or greater Baseline:  Goal status: INITIAL  3.  Quick dash will improve to no greater than 20% to demonstrate improved function Baseline:  Goal status:  INITIAL  4.  Pt will have improved sleep by 50% or more to be more rested during the day Baseline:  Goal status: INITIAL   PLAN:  PT FREQUENCY: 2x/week  PT DURATION: 6 weeks  PLANNED INTERVENTIONS: [id number]- PT Re-evaluation, 97750- Physical Performance Testing, 97110-Therapeutic exercises, 97530- Therapeutic activity, [id number]- Neuromuscular re-education, 97535- Self Care, [id number]- Manual therapy, 20560 (1-2 muscles), 20561 (3+ muscles)- Dry Needling, and Patient/Family education  PLAN FOR NEXT SESSION: Review HEP, STM bilateral UT, axillary border of pecs, sternal border as appropriate,Careful on left due to Pacemaker. MFR to bilateral chest areas, Mastectomy scar massage,cupping,PROM, update HEP  Grayce JINNY Sheldon, PT 04/13/2024, 10:57 AM

## 2024-04-15 DIAGNOSIS — I5022 Chronic systolic (congestive) heart failure: Secondary | ICD-10-CM | POA: Diagnosis not present

## 2024-04-16 ENCOUNTER — Ambulatory Visit

## 2024-04-16 DIAGNOSIS — M25512 Pain in left shoulder: Secondary | ICD-10-CM | POA: Diagnosis not present

## 2024-04-16 DIAGNOSIS — R293 Abnormal posture: Secondary | ICD-10-CM | POA: Diagnosis not present

## 2024-04-16 DIAGNOSIS — Z171 Estrogen receptor negative status [ER-]: Secondary | ICD-10-CM

## 2024-04-16 DIAGNOSIS — Z9013 Acquired absence of bilateral breasts and nipples: Secondary | ICD-10-CM

## 2024-04-16 DIAGNOSIS — R0789 Other chest pain: Secondary | ICD-10-CM

## 2024-04-16 DIAGNOSIS — Z853 Personal history of malignant neoplasm of breast: Secondary | ICD-10-CM | POA: Diagnosis not present

## 2024-04-16 NOTE — Therapy (Signed)
 OUTPATIENT PHYSICAL THERAPY  UPPER EXTREMITY ONCOLOGY TREATMENT  Patient Name: SAYLOR MURRY MRN: [id number] DOB:[birth year], 73 y.o., female Today's Date: 04/16/2024  END OF SESSION:  PT End of Session - 04/16/24 1001     Visit Number 5    Number of Visits 12    Date for PT Re-Evaluation 05/11/24    PT Start Time 1001    PT Stop Time 1050    PT Time Calculation (min) 49 min    Activity Tolerance Patient tolerated treatment well    Behavior During Therapy WFL for tasks assessed/performed          Past Medical History:  Diagnosis Date   AICD (automatic cardioverter/defibrillator) present    Anemia    Anginal pain (HCC)    Anxiety    Arthritis    Lumbar spine DDD.   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   Diabetes mellitus without complication (HCC)    Type II   Dyspnea    When patient gets sick uses Pro-Air inhaler   Frequent sinus infections    GERD (gastroesophageal reflux disease)    occ   Goiter    History of cardiomegaly    History of cataract    Bilateral   History of kidney stones    08/28/2018 currently has a large kidney stone, asymptomatic at this time   History of migraine    History of uterine fibroid    History of uterine prolapse    Hyperlipidemia    Hypertension    LBBB (left bundle branch block)    Menopause 03/03/2012   Nasal turbinate hypertrophy 11/29/2016   Left inferior    Personal history of chemotherapy    Personal history of radiation therapy    Pneumonia    as a child   PONV (postoperative nausea and vomiting)    Sinusitis 2014   Sleep apnea    No CPAP   SUI (stress urinary incontinence, female)    SVD (spontaneous vaginal delivery)    x 2   Thyroid  nodule    Past Surgical History:  Procedure Laterality Date   ABDOMINAL HYSTERECTOMY      BILATERAL SALPINGOOPHORECTOMY Bilateral    BIV ICD INSERTION CRT-D N/A 08/24/2022   Procedure: BIV ICD INSERTION CRT-D;  Surgeon: Waddell Danelle ORN, MD;  Location: [facility] INVASIVE CV LAB;  Service: Cardiovascular;  Laterality: N/A;   BIV ICD INSERTION CRT-D N/A 11/07/2023   Procedure: BIV ICD INSERTION CRT-D;  Surgeon: Waddell Danelle ORN, MD;  Location: [facility] INVASIVE CV LAB;  Service: Cardiovascular;  Laterality: N/A;   BLADDER SUSPENSION N/A 05/16/2017   Procedure: TRANSVAGINAL TAPE (TVT) PROCEDURE;  Surgeon: Henry Slough, MD;  Location: [facility] ORS;  Service: Gynecology;  Laterality: N/A;   BREAST BIOPSY Right 2009   BREAST IMPLANT REMOVAL Bilateral 02/05/2022   Procedure: Removal bilateral breast implants;  Surgeon: Elisabeth Craig RAMAN, MD;  Location: [facility] OR;  Service: Plastics;  Laterality: Bilateral;   BREAST LUMPECTOMY Right 2009   BREAST LUMPECTOMY Left 2006   BREAST RECONSTRUCTION WITH PLACEMENT OF TISSUE EXPANDER AND FLEX HD (ACELLULAR HYDRATED DERMIS) Bilateral 09/04/2021   Procedure: BILATERAL BREAST RECONSTRUCTION WITH PLACEMENT OF TISSUE EXPANDER AND FLEX HD (ACELLULAR HYDRATED DERMIS);  Surgeon: Elisabeth Craig RAMAN, MD;  Location: [facility] OR;  Service: Plastics;  Laterality:  Bilateral;   CARDIAC CATHETERIZATION     CATARACT EXTRACTION Left 10/23/2017   CATARACT EXTRACTION Right 11/06/2017   COLONOSCOPY  10/22/2009   Normal.  Repeat 5 years.  [name]   COLONOSCOPY WITH PROPOFOL  N/A 12/21/2022   Procedure: COLONOSCOPY WITH PROPOFOL ;  Surgeon: Rollin Dover, MD;  Location: [facility];  Service: Gastroenterology;  Laterality: N/A;   CYSTOSCOPY N/A 05/16/2017   Procedure: CYSTOSCOPY;  Surgeon: Henry Slough, MD;  Location: [facility] ORS;  Service: Gynecology;  Laterality: N/A;   DEBRIDEMENT AND CLOSURE WOUND Bilateral 10/03/2021   Procedure: Debridement bilateral mastectomy flaps;  Surgeon: Elisabeth Craig RAMAN, MD;  Location: [facility] OR;  Service: Plastics;  Laterality: Bilateral;  1.5 hour   EYE SURGERY     bilateral  cataracts   LEAD EXTRACTION N/A 11/07/2023   Procedure: LEAD EXTRACTION;  Surgeon: Waddell Danelle ORN, MD;  Location: [facility] INVASIVE CV LAB;  Service: Cardiovascular;  Laterality: N/A;   LEFT HEART CATH AND CORONARY ANGIOGRAPHY N/A 02/09/2020   Procedure: LEFT HEART CATH AND CORONARY ANGIOGRAPHY;  Surgeon: Elmira Newman PARAS, MD;  Location: [facility] INVASIVE CV LAB;  Service: Cardiovascular;  Laterality: N/A;   LEFT HEART CATH AND CORONARY ANGIOGRAPHY N/A 03/27/2022   Procedure: LEFT HEART CATH AND CORONARY ANGIOGRAPHY;  Surgeon: Ladona Heinz, MD;  Location: [facility] INVASIVE CV LAB;  Service: Cardiovascular;  Laterality: N/A;   MASS EXCISION Right 12/04/2022   Procedure: EXCISION OF RIGHT CHEST WALL MASS;  Surgeon: Aron Shoulders, MD;  Location: [facility] OR;  Service: General;  Laterality: Right;   MASTECTOMY W/ SENTINEL NODE BIOPSY Right 09/04/2021   Procedure: BILATERAL MASTECTOMIES WITH RIGHT SENTINEL LYMPH NODE BIOPSY;  Surgeon: Aron Shoulders, MD;  Location: [facility] OR;  Service: General;  Laterality: Right;   REMOVAL OF BILATERAL TISSUE EXPANDERS WITH PLACEMENT OF BILATERAL BREAST IMPLANTS Bilateral 10/03/2021   Procedure: REMOVAL OF BILATERAL TISSUE EXPANDERS WITH REPLACEMENT OF TISSUE EXPANDERS;  Surgeon: Elisabeth Craig RAMAN, MD;  Location: [facility] OR;  Service: Plastics;  Laterality: Bilateral;   REMOVAL OF BILATERAL TISSUE EXPANDERS WITH PLACEMENT OF BILATERAL BREAST IMPLANTS Bilateral 12/19/2021   Procedure: REMOVAL OF BILATERAL TISSUE EXPANDERS WITH PLACEMENT OF BILATERAL BREAST IMPLANTS;  Surgeon: Elisabeth Craig RAMAN, MD;  Location: [facility] OR;  Service: Plastics;  Laterality: Bilateral;   ROBOTIC ASSISTED TOTAL HYSTERECTOMY N/A 05/16/2017   Procedure: ROBOTIC ASSISTED TOTAL HYSTERECTOMY;  Surgeon: Darcel Pool, MD;  Location: [facility] ORS;  Service: Gynecology;  Laterality: N/A;   ROTATOR CUFF REPAIR Bilateral    SCAR REVISION N/A 12/04/2022   Procedure: REVISION OF MASTECTOMY SCAR;  Surgeon: Aron Shoulders, MD;  Location: [facility] OR;  Service:  General;  Laterality: N/A;   SHOULDER ARTHROSCOPY WITH ROTATOR CUFF REPAIR AND SUBACROMIAL DECOMPRESSION Right 09/03/2018   Procedure: Right shoulder manipulation under anesthesia, exam under anesthesia, mini open rotator cuff repair, subacromial decompression;  Surgeon: Duwayne Purchase, MD;  Location: [facility] ORS;  Service: Orthopedics;  Laterality: Right;  90 mins   TRANSESOPHAGEAL ECHOCARDIOGRAM (CATH LAB) N/A 11/07/2023   Procedure: TRANSESOPHAGEAL ECHOCARDIOGRAM;  Surgeon: Waddell Danelle ORN, MD;  Location: [facility] INVASIVE CV LAB;  Service: Cardiovascular;  Laterality: N/A;   TUBAL LIGATION     WISDOM TOOTH EXTRACTION     Patient Active Problem List   Diagnosis Date Noted   Chronic systolic heart failure (HCC) 11/07/2023   ICD (implantable cardioverter-defibrillator) in place 12/25/2022   Bradycardia 08/24/2022   BRCA2 gene mutation positive in female 07/30/2022   LBBB (left bundle branch block)    Chronic diastolic heart failure (  HCC)    NICM (nonischemic cardiomyopathy) (HCC)    Breast cancer of upper-outer quadrant of right female breast (HCC) 09/04/2021   Diabetic neuropathy (HCC) 02/08/2021   Angina pectoris (HCC) 02/09/2020   Abnormal stress test 02/09/2020   Complete rotator cuff tear 09/03/2018   Pelvic prolapse 05/16/2017   Intractable chronic post-traumatic headache 11/19/2016   Malignant neoplasm of upper-outer quadrant of left breast in female, estrogen receptor negative (HCC) 03/03/2012   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   Menopause 03/03/2012    PCP: Ryan Hives, MD  REFERRING PROVIDER: Mackey Chad, MD  REFERRING DIAG: s/p breast Cancer/ shoulder pain  THERAPY DIAG:  History of bilateral mastectomy  Malignant neoplasm of upper-outer quadrant of left breast in female, estrogen receptor negative (HCC)  Left shoulder pain, unspecified chronicity  Abnormal posture  Muscular chest pain  ONSET DATE: 11/07/2023  Rationale for Evaluation and  Treatment: Rehabilitation  SUBJECTIVE:                                                                                                                                                                                           SUBJECTIVE STATEMENT:  I'm in a little more pain than normal on the left side where the pacemaker is located. I did OK after last visit. I felt pretty good when I left here. I have been keeping up with the exs. I feel looser through the chest area than I was.  PERTINENT HISTORY:   Pt was diagnosed with right breast cancer dx 05/2021. She has BRCA2 genetic mutation and has had left breast cancer in 2006 with chemo/breast conservation and XRT. She then had right breast cancer (dcis)  in 2009 with lumpectomy and radiation only. In 2022 she presented  with a palpable painful mass in the upper outer right breast. Dx imaging showed a 1.2 cm mass at 10 o'clock. 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  Pt underwent bilateral mastectomies with right sentinel node biopsy and expander reconstruction (Pace) 09/04/21. She had 6 negative nodes and negative margins. Patient ended up having the expanders removed 01/26/2022 because of continued drainage and discomfort.  2.14.2024 : revision of left mastectomy scar and removal of mass on right lateral mastectomy scar. Core needle biopsy was performed and this demonstrated invasive ductal carcinoma, grade 3. This was weakly ER positive, PR negative, HER2 positive, Ki-67 70%. She had 11 cycles of Breast Maintenane Trastuzumab    Pt also has a Investment banker, corporate.  She has a decrease ejection fraction of 20- 30% .   PAIN:  Are you having pain? Yes NPRS scale:  7/10 present on left and 4/10 on right Pain location: bilateral chest greater on left and left upper arm Pain orientation: Bilateral  PAIN TYPE: aching, dull, sharp, and tight Pain description: constant on the left chest, but can be sharp, intermittent left  arm pain Aggravating factors: sleeping on left, reaching Relieving factors: massaging, topical, ibuprofen   PRECAUTIONS: at risk for lymphedema , pacemaker, defibrillator, DM, Right RTC repair 2019, cardiomyopathy, LV EF 20-25%  RED FLAGS: None   WEIGHT BEARING RESTRICTIONS: No  FALLS:  Has patient fallen in last 6 months? No  LIVING ENVIRONMENT: Lives with: pts mom lives with her and her daughter temporarily Lives in: House/apartment  OCCUPATION: does not work  LEISURE: walks 3 days /week, can walk up to 2 miles with breaks  HAND DOMINANCE: right   PRIOR LEVEL OF FUNCTION: Independent  PATIENT GOALS: Decrease pain, improve ROM   OBJECTIVE: Note: Objective measures were completed at Evaluation unless otherwise noted.  COGNITION: Overall cognitive status: Within functional limits for tasks assessed   PALPATION: Tender bilateral UT, pectorals, incision areas from pacemaker B  OBSERVATIONS / OTHER ASSESSMENTS: Bilateral mastectomy incisions healed but very restricted, keloid scar right chest from pacemaker removal, and new keloid scar on left chest from new pacemaker.defibrillator placement.  SENSATION: Light touch: Deficits     POSTURE: forward head rounded shoulders   UPPER EXTREMITY AROM/PROM:  A/PROM RIGHT   eval   Shoulder extension 48  Shoulder flexion 127(pulls chest)  Shoulder abduction 110, pain pulling chest  Shoulder internal rotation   Shoulder external rotation     (Blank rows = not tested)  A/PROM LEFT   eval  Shoulder extension 40, tight  Shoulder flexion 65 (tight/pain chest)  Shoulder abduction 70 ( tight chest)  Shoulder internal rotation   Shoulder external rotation     (Blank rows = not tested)  CERVICAL AROM: All within functional limits: Limited 40-50% with Bilateral rotation and SB     UPPER EXTREMITY STRENGTH:   LYMPHEDEMA ASSESSMENTS:   SURGERY TYPE/DATE: Multiple surgeries 2006, 2013 2022, 2024 B.Mastectomies with  reconstruction, Implants removed 2023 due to non healing wound,  NUMBER OF LYMPH NODES REMOVED: 6 right  CHEMOTHERAPY: YES, ongoing  RADIATION:YEs  HORMONE TREATMENT: Yes, Anastrozole   INFECTIONS: non healing wound requiring removal of implants    LYMPHEDEMA ASSESSMENTS:   LANDMARK RIGHT  eval  At axilla    15 cm proximal to olecranon process   10 cm proximal to olecranon process   Olecranon process   15 cm proximal to ulnar styloid process   10 cm proximal to ulnar styloid process   Just proximal to ulnar styloid process   Across hand at thumb web space   At base of 2nd digit   (Blank rows = not tested)  LANDMARK LEFT  eval  At axilla    15 cm proximal to olecranon process   10 cm proximal to olecranon process   Olecranon process   15 cm proximal to ulnar styloid process   10 cm proximal to ulnar styloid process   Just proximal to ulnar styloid process   Across hand at thumb web space   At base of 2nd digit   (Blank rows = not tested)   FUNCTIONAL TESTS:    GAIT: WNL   QUICK DASH SURVEY: 36.36  TREATMENT DATE:   04/16/2024 Supine wand flexion and scaption x 5, 5 sec hold with exhale at the end Star gazer x 5 Snow angels x 5 B, 5 sec hold 3D bilateral flexion, scaption, horizontal abd x 4 MFR to bilateral chest regions avoiding pacemaker on left,  STM to bilateral UT, Right pectorals and bilateral lateral trunk with cocoa butter, scar massage left mastectomy incision  04/13/2024 Supine wand flexion and scaption x 5, 5 sec hold with exhale at the end Star gazer x 5 Snow angels x 5 B, 5 sec hold MFR to bilateral chest regions avoiding pacemaker on left,  STM to bilateral UT, Right pectorals and bilateral lateral trunk with cocoa butter, scar massage left mastectomy incision PROM bilateral shoulder flex, scaption, abd, IR and ER with  VC's prn to relax Showed SB stretch to stretch lateral trunk  04/08/2024 B Scapular retraction x 5 Bilateral shoulder abd x 4 with wall slides Supine wand flexion and scaption x 3, 5 sec hold with exhale at the end [facility] angels x 5 B, 5 sec hold MFR to bilateral chest regions avoiding pacemaker on left PROM bilateral shoulder flex, scaption, abd, IR and ER with VC's prn to relax 04/02/24: Therapeutic Exercises Pulleys into flex x 2 mins and abd x 3 mins with pt returning therapist demo and VC's during to relax shoulders. Pt reported starting to feel increased discomfort at pacemaker site Manual Therapy P/ROM in supine to bil shoulders into flex, abd and D2, carefully on Lt side due to pacemaker, scapular depression by therapist throughout STM over areas of mastectomy and Rt pacemaker scar tissue but avoided area of current pace maker placement on Lt  03/30/2024 Discussed POC, LOS, treatment interventions.. Instructed in supine wand exs for flexion and scaption x 5 ea and supine stargazer x 5. Updated HEP with illustrated and written instructions, also showed scapular retraction, but forgot to give pic. Discussed new charging rule for DN.   PATIENT EDUCATION:  Education details: supine wand exs for flexion and scaption x 5 ea and supine stargazer. Person educated: Patient Education method: Explanation, Demonstration, and Handouts Education comprehension: verbalized understanding and returned demonstration  HOME EXERCISE PROGRAM: supine wand exs for flexion and scaption x 5 ea and supine stargazer x 5 to perform 2x/day  ASSESSMENT:  CLINICAL IMPRESSION: Pt with increased left chest pain today for unknown reasons but felt better after treatment. Bilateral chest  feels looser to pt now.  OBJECTIVE IMPAIRMENTS: decreased activity tolerance, decreased knowledge of condition, decreased ROM, decreased strength, increased fascial restrictions, impaired flexibility, impaired UE functional use,  postural dysfunction, and pain.   ACTIVITY LIMITATIONS: carrying, sleeping, dressing, reach over head, and hygiene/grooming  PARTICIPATION LIMITATIONS: cleaning, laundry, yard work, and activities requiring significant use of arms  PERSONAL FACTORS: 3+ comorbidities: Multiple breast surgeries, chemo, radiation, pacemaker swithced right to left are also affecting patient's functional outcome.   REHAB POTENTIAL: Good  CLINICAL DECISION MAKING: Evolving/moderate complexity  EVALUATION COMPLEXITY: Moderate  GOALS: Goals reviewed with patient? Yes  SHORT TERM GOALS: Target date: 04/20/2024  Pt will be independent in a HEP for bilateral shoulder ROM/strength Baseline: Goal status: INITIAL  2.  Pt will have decreased chest/arm pain by 25% Baseline:  Goal status: INITIAL  3.  Pt will improve Shoulder AROM for Left flexion and  B abd  by 25-30 degrees Baseline:  Goal status: INITIAL   LONG TERM GOALS: Target date: 05/11/2024  Pt will have bilateral shoulder ROM for flexion and scaption to  125 degrees for improved reaching abiliy Baseline:  Goal status: INITIAL  2.  Pt will report decreased chest and left UE pain by 50% or greater Baseline:  Goal status: INITIAL  3.  Quick dash will improve to no greater than 20% to demonstrate improved function Baseline:  Goal status: INITIAL  4.  Pt will have improved sleep by 50% or more to be more rested during the day Baseline:  Goal status: INITIAL   PLAN:  PT FREQUENCY: 2x/week  PT DURATION: 6 weeks  PLANNED INTERVENTIONS: 97164- PT Re-evaluation, 97750- Physical Performance Testing, 97110-Therapeutic exercises, 97530- Therapeutic activity, [id number]- Neuromuscular re-education, 97535- Self Care, [id number]- Manual therapy, 20560 (1-2 muscles), 20561 (3+ muscles)- Dry Needling, and Patient/Family education  PLAN FOR NEXT SESSION: Review HEP, STM bilateral UT, axillary border of pecs, sternal border as appropriate,Careful on left due to  Pacemaker. MFR to bilateral chest areas, Mastectomy scar massage,cupping,PROM, update HEP  Grayce JINNY Sheldon, PT 04/16/2024, 10:54 AM

## 2024-04-20 ENCOUNTER — Ambulatory Visit

## 2024-04-20 DIAGNOSIS — R293 Abnormal posture: Secondary | ICD-10-CM

## 2024-04-20 DIAGNOSIS — Z9013 Acquired absence of bilateral breasts and nipples: Secondary | ICD-10-CM

## 2024-04-20 DIAGNOSIS — Z853 Personal history of malignant neoplasm of breast: Secondary | ICD-10-CM | POA: Diagnosis not present

## 2024-04-20 DIAGNOSIS — M25512 Pain in left shoulder: Secondary | ICD-10-CM

## 2024-04-20 DIAGNOSIS — R0789 Other chest pain: Secondary | ICD-10-CM

## 2024-04-20 DIAGNOSIS — Z171 Estrogen receptor negative status [ER-]: Secondary | ICD-10-CM

## 2024-04-20 NOTE — Therapy (Signed)
 OUTPATIENT PHYSICAL THERAPY  UPPER EXTREMITY ONCOLOGY TREATMENT  Patient Name: Jordan Willis MRN: [id number] DOB:18-Jul-1951, 73 y.o., female Today's Date: 04/20/2024  END OF SESSION:  PT End of Session - 04/20/24 1001     Visit Number 6    Number of Visits 12    Date for PT Re-Evaluation 05/11/24    PT Start Time 1001    PT Stop Time 1105    PT Time Calculation (min) 64 min    Activity Tolerance Patient tolerated treatment well    Behavior During Therapy WFL for tasks assessed/performed          Past Medical History:  Diagnosis Date   AICD (automatic cardioverter/defibrillator) present    Anemia    Anginal pain (HCC)    Anxiety    Arthritis    Lumbar spine DDD.   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   Diabetes mellitus without complication (HCC)    Type II   Dyspnea    When patient gets sick uses Pro-Air inhaler   Frequent sinus infections    GERD (gastroesophageal reflux disease)    occ   Goiter    History of cardiomegaly    History of cataract    Bilateral   History of kidney stones    08/28/2018 currently has a large kidney stone, asymptomatic at this time   History of migraine    History of uterine fibroid    History of uterine prolapse    Hyperlipidemia    Hypertension    LBBB (left bundle branch block)    Menopause 03/03/2012   Nasal turbinate hypertrophy 11/29/2016   Left inferior    Personal history of chemotherapy    Personal history of radiation therapy    Pneumonia    as a child   PONV (postoperative nausea and vomiting)    Sinusitis 2014   Sleep apnea    No CPAP   SUI (stress urinary incontinence, female)    SVD (spontaneous vaginal delivery)    x 2   Thyroid  nodule    Past Surgical History:  Procedure Laterality Date   ABDOMINAL HYSTERECTOMY      BILATERAL SALPINGOOPHORECTOMY Bilateral    BIV ICD INSERTION CRT-D N/A 08/24/2022   Procedure: BIV ICD INSERTION CRT-D;  Surgeon: Waddell Danelle ORN, MD;  Location: [facility] INVASIVE CV LAB;  Service: Cardiovascular;  Laterality: N/A;   BIV ICD INSERTION CRT-D N/A 11/07/2023   Procedure: BIV ICD INSERTION CRT-D;  Surgeon: Waddell Danelle ORN, MD;  Location: [facility] INVASIVE CV LAB;  Service: Cardiovascular;  Laterality: N/A;   BLADDER SUSPENSION N/A 05/16/2017   Procedure: TRANSVAGINAL TAPE (TVT) PROCEDURE;  Surgeon: Henry Slough, MD;  Location: [facility] ORS;  Service: Gynecology;  Laterality: N/A;   BREAST BIOPSY Right 2009   BREAST IMPLANT REMOVAL Bilateral 02/05/2022   Procedure: Removal bilateral breast implants;  Surgeon: Elisabeth Craig RAMAN, MD;  Location: [facility] OR;  Service: Plastics;  Laterality: Bilateral;   BREAST LUMPECTOMY Right 2009   BREAST LUMPECTOMY Left 2006   BREAST RECONSTRUCTION WITH PLACEMENT OF TISSUE EXPANDER AND FLEX HD (ACELLULAR HYDRATED DERMIS) Bilateral 09/04/2021   Procedure: BILATERAL BREAST RECONSTRUCTION WITH PLACEMENT OF TISSUE EXPANDER AND FLEX HD (ACELLULAR HYDRATED DERMIS);  Surgeon: Elisabeth Craig RAMAN, MD;  Location: [facility] OR;  Service: Plastics;  Laterality:  Bilateral;   CARDIAC CATHETERIZATION     CATARACT EXTRACTION Left 10/23/2017   CATARACT EXTRACTION Right 11/06/2017   COLONOSCOPY  10/22/2009   Normal.  Repeat 5 years.  [name]   COLONOSCOPY WITH PROPOFOL  N/A 12/21/2022   Procedure: COLONOSCOPY WITH PROPOFOL ;  Surgeon: Rollin Dover, MD;  Location: [facility];  Service: Gastroenterology;  Laterality: N/A;   CYSTOSCOPY N/A 05/16/2017   Procedure: CYSTOSCOPY;  Surgeon: Henry Slough, MD;  Location: [facility] ORS;  Service: Gynecology;  Laterality: N/A;   DEBRIDEMENT AND CLOSURE WOUND Bilateral 10/03/2021   Procedure: Debridement bilateral mastectomy flaps;  Surgeon: Elisabeth Craig RAMAN, MD;  Location: [facility] OR;  Service: Plastics;  Laterality: Bilateral;  1.5 hour   EYE SURGERY     bilateral  cataracts   LEAD EXTRACTION N/A 11/07/2023   Procedure: LEAD EXTRACTION;  Surgeon: Waddell Danelle ORN, MD;  Location: [facility] INVASIVE CV LAB;  Service: Cardiovascular;  Laterality: N/A;   LEFT HEART CATH AND CORONARY ANGIOGRAPHY N/A 02/09/2020   Procedure: LEFT HEART CATH AND CORONARY ANGIOGRAPHY;  Surgeon: Elmira Newman PARAS, MD;  Location: [facility] INVASIVE CV LAB;  Service: Cardiovascular;  Laterality: N/A;   LEFT HEART CATH AND CORONARY ANGIOGRAPHY N/A 03/27/2022   Procedure: LEFT HEART CATH AND CORONARY ANGIOGRAPHY;  Surgeon: Ladona Heinz, MD;  Location: [facility] INVASIVE CV LAB;  Service: Cardiovascular;  Laterality: N/A;   MASS EXCISION Right 12/04/2022   Procedure: EXCISION OF RIGHT CHEST WALL MASS;  Surgeon: Aron Shoulders, MD;  Location: [facility] OR;  Service: General;  Laterality: Right;   MASTECTOMY W/ SENTINEL NODE BIOPSY Right 09/04/2021   Procedure: BILATERAL MASTECTOMIES WITH RIGHT SENTINEL LYMPH NODE BIOPSY;  Surgeon: Aron Shoulders, MD;  Location: [facility] OR;  Service: General;  Laterality: Right;   REMOVAL OF BILATERAL TISSUE EXPANDERS WITH PLACEMENT OF BILATERAL BREAST IMPLANTS Bilateral 10/03/2021   Procedure: REMOVAL OF BILATERAL TISSUE EXPANDERS WITH REPLACEMENT OF TISSUE EXPANDERS;  Surgeon: Elisabeth Craig RAMAN, MD;  Location: [facility] OR;  Service: Plastics;  Laterality: Bilateral;   REMOVAL OF BILATERAL TISSUE EXPANDERS WITH PLACEMENT OF BILATERAL BREAST IMPLANTS Bilateral 12/19/2021   Procedure: REMOVAL OF BILATERAL TISSUE EXPANDERS WITH PLACEMENT OF BILATERAL BREAST IMPLANTS;  Surgeon: Elisabeth Craig RAMAN, MD;  Location: [facility] OR;  Service: Plastics;  Laterality: Bilateral;   ROBOTIC ASSISTED TOTAL HYSTERECTOMY N/A 05/16/2017   Procedure: ROBOTIC ASSISTED TOTAL HYSTERECTOMY;  Surgeon: Darcel Pool, MD;  Location: [facility] ORS;  Service: Gynecology;  Laterality: N/A;   ROTATOR CUFF REPAIR Bilateral    SCAR REVISION N/A 12/04/2022   Procedure: REVISION OF MASTECTOMY SCAR;  Surgeon: Aron Shoulders, MD;  Location: [facility] OR;  Service:  General;  Laterality: N/A;   SHOULDER ARTHROSCOPY WITH ROTATOR CUFF REPAIR AND SUBACROMIAL DECOMPRESSION Right 09/03/2018   Procedure: Right shoulder manipulation under anesthesia, exam under anesthesia, mini open rotator cuff repair, subacromial decompression;  Surgeon: Duwayne Purchase, MD;  Location: [facility] ORS;  Service: Orthopedics;  Laterality: Right;  90 mins   TRANSESOPHAGEAL ECHOCARDIOGRAM (CATH LAB) N/A 11/07/2023   Procedure: TRANSESOPHAGEAL ECHOCARDIOGRAM;  Surgeon: Waddell Danelle ORN, MD;  Location: [facility] INVASIVE CV LAB;  Service: Cardiovascular;  Laterality: N/A;   TUBAL LIGATION     WISDOM TOOTH EXTRACTION     Patient Active Problem List   Diagnosis Date Noted   Chronic systolic heart failure (HCC) 11/07/2023   ICD (implantable cardioverter-defibrillator) in place 12/25/2022   Bradycardia 08/24/2022   BRCA2 gene mutation positive in female 07/30/2022   LBBB (left bundle branch block)    Chronic diastolic heart failure (  HCC)    NICM (nonischemic cardiomyopathy) (HCC)    Breast cancer of upper-outer quadrant of right female breast (HCC) 09/04/2021   Diabetic neuropathy (HCC) 02/08/2021   Angina pectoris (HCC) 02/09/2020   Abnormal stress test 02/09/2020   Complete rotator cuff tear 09/03/2018   Pelvic prolapse 05/16/2017   Intractable chronic post-traumatic headache 11/19/2016   Malignant neoplasm of upper-outer quadrant of left breast in female, estrogen receptor negative (HCC) 03/03/2012   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   Menopause 03/03/2012    PCP: Ryan Hives, MD  REFERRING PROVIDER: Mackey Chad, MD  REFERRING DIAG: s/p breast Cancer/ shoulder pain  THERAPY DIAG:  History of bilateral mastectomy  Malignant neoplasm of upper-outer quadrant of left breast in female, estrogen receptor negative (HCC)  Left shoulder pain, unspecified chronicity  Abnormal posture  Muscular chest pain  ONSET DATE: 11/07/2023  Rationale for Evaluation and  Treatment: Rehabilitation  SUBJECTIVE:                                                                                                                                                                                           SUBJECTIVE STATEMENT:  I am still hurting on the left side. I feel like there is something wrong on the left. It hurts all the time but with different degrees of pain. Overall pain has improved some. The right chest where the scar is hurts less,  and left UT is much better and the exercises take the tension out of the UT. Where the pacemaker is maybe has improved some but I still get the sharp pain once in a while at random times not necessarily when I am using it.My left arm has been doing better with no recent sharp pains. My shoulder ROM has improved a lot. I can reach the top shelf in the cabinet with no problem.   PERTINENT HISTORY:   Pt was diagnosed with right breast cancer dx 05/2021. She has BRCA2 genetic mutation and has had left breast cancer in 2006 with chemo/breast conservation and XRT. She then had right breast cancer (dcis)  in 2009 with lumpectomy and radiation only. In 2022 she presented  with a palpable painful mass in the upper outer right breast. Dx imaging showed a 1.2 cm mass at 10 o'clock. 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  Pt underwent bilateral mastectomies with right sentinel node biopsy and expander reconstruction (Pace) 09/04/21. She had 6 negative nodes and negative margins. Patient ended up having the expanders removed 01/26/2022 because of continued drainage and discomfort.  2.14.2024 : revision of left mastectomy scar  and removal of mass on right lateral mastectomy scar. Core needle biopsy was performed and this demonstrated invasive ductal carcinoma, grade 3. This was weakly ER positive, PR negative, HER2 positive, Ki-67 70%. She had 11 cycles of Breast Maintenane Trastuzumab    Pt also has a Freight forwarder.  She has a decrease ejection fraction of 20- 30% .   PAIN:  Are you having pain? Yes NPRS scale: 7/10 present on left and 4/10 on right Pain location: bilateral chest greater on left and left upper arm Pain orientation: Bilateral  PAIN TYPE: aching, dull, sharp, and tight Pain description: constant on the left chest, but can be sharp,  Aggravating factors: sleeping on left, reaching Relieving factors: massaging, topical, ibuprofen   PRECAUTIONS: at risk for lymphedema , pacemaker, defibrillator, DM, Right RTC repair 2019, cardiomyopathy, LV EF 20-25%  RED FLAGS: None   WEIGHT BEARING RESTRICTIONS: No  FALLS:  Has patient fallen in last 6 months? No  LIVING ENVIRONMENT: Lives with: pts mom lives with her and her daughter temporarily Lives in: House/apartment  OCCUPATION: does not work  LEISURE: walks 3 days /week, can walk up to 2 miles with breaks  HAND DOMINANCE: right   PRIOR LEVEL OF FUNCTION: Independent  PATIENT GOALS: Decrease pain, improve ROM   OBJECTIVE: Note: Objective measures were completed at Evaluation unless otherwise noted.  COGNITION: Overall cognitive status: Within functional limits for tasks assessed   PALPATION: Tender bilateral UT, pectorals, incision areas from pacemaker B  OBSERVATIONS / OTHER ASSESSMENTS: Bilateral mastectomy incisions healed but very restricted, keloid scar right chest from pacemaker removal, and new keloid scar on left chest from new pacemaker.defibrillator placement.  SENSATION: Light touch: Deficits     POSTURE: forward head rounded shoulders   UPPER EXTREMITY AROM/PROM:  A/PROM RIGHT   eval  RIGHT 04/20/2024  Shoulder extension 48 51  Shoulder flexion 127(pulls chest) 146  Shoulder abduction 110, pain pulling chest 146  Shoulder internal rotation    Shoulder external rotation      (Blank rows = not tested)  A/PROM LEFT   eval LEFT 04/20/2024  Shoulder extension 40, tight 46  Shoulder  flexion 65 (tight/pain chest) 102, chest pain  Shoulder abduction 70 ( tight chest) 115  chest pain  Shoulder internal rotation    Shoulder external rotation      (Blank rows = not tested)  CERVICAL AROM: All within functional limits: Limited 40-50% with Bilateral rotation and SB     UPPER EXTREMITY STRENGTH:   LYMPHEDEMA ASSESSMENTS:   SURGERY TYPE/DATE: Multiple surgeries 2006, 2013 2022, 2024 B.Mastectomies with reconstruction, Implants removed 2023 due to non healing wound,  NUMBER OF LYMPH NODES REMOVED: 6 right  CHEMOTHERAPY: YES, ongoing  RADIATION:YEs  HORMONE TREATMENT: Yes, Anastrozole   INFECTIONS: non healing wound requiring removal of implants    LYMPHEDEMA ASSESSMENTS:   LANDMARK RIGHT  eval  At axilla    15 cm proximal to olecranon process   10 cm proximal to olecranon process   Olecranon process   15 cm proximal to ulnar styloid process   10 cm proximal to ulnar styloid process   Just proximal to ulnar styloid process   Across hand at thumb web space   At base of 2nd digit   (Blank rows = not tested)  LANDMARK LEFT  eval  At axilla    15 cm proximal to olecranon process   10 cm proximal to olecranon process   Olecranon process   15 cm proximal to ulnar styloid  process   10 cm proximal to ulnar styloid process   Just proximal to ulnar styloid process   Across hand at thumb web space   At base of 2nd digit   (Blank rows = not tested)   FUNCTIONAL TESTS:    GAIT: WNL   QUICK DASH SURVEY: 36.36                                                                                                                            TREATMENT DATE:   04/20/2024 Supine wand flexion and scaption x 5, 5 sec hold with exhale at the end Star gazer x 5 Snow angels x 5 B, 5 sec hold STM to bilateral UT, Right pectorals and bilateral lateral trunk with cocoa butter, scar massage left mastectomy incision PROM bilateral shoulder flex, scaption, abd, IR and ER  with VC's prn to relax 3 D AROM Flexion, scaption, horizontal abd x 3 Measured AROM bilateral shoulders and checked STG's for pt. Progress. 04/16/2024 Supine wand flexion and scaption x 5, 5 sec hold with exhale at the end Star gazer x 5 Snow angels x 5 B, 5 sec hold 3D bilateral flexion, scaption, horizontal abd x 4 MFR to bilateral chest regions avoiding pacemaker on left,  STM to bilateral UT, Right pectorals and bilateral lateral trunk with cocoa butter, scar massage left mastectomy incision  04/13/2024 Supine wand flexion and scaption x 5, 5 sec hold with exhale at the end Star gazer x 5 Snow angels x 5 B, 5 sec hold MFR to bilateral chest regions avoiding pacemaker on left,  STM to bilateral UT, Right pectorals and bilateral lateral trunk with cocoa butter, scar massage left mastectomy incision PROM bilateral shoulder flex, scaption, abd, IR and ER with VC's prn to relax Showed SB stretch to stretch lateral trunk  04/08/2024 B Scapular retraction x 5 Bilateral shoulder abd x 4 with wall slides Supine wand flexion and scaption x 3, 5 sec hold with exhale at the end [facility] angels x 5 B, 5 sec hold MFR to bilateral chest regions avoiding pacemaker on left PROM bilateral shoulder flex, scaption, abd, IR and ER with VC's prn to relax 04/02/24: Therapeutic Exercises Pulleys into flex x 2 mins and abd x 3 mins with pt returning therapist demo and VC's during to relax shoulders. Pt reported starting to feel increased discomfort at pacemaker site Manual Therapy P/ROM in supine to bil shoulders into flex, abd and D2, carefully on Lt side due to pacemaker, scapular depression by therapist throughout STM over areas of mastectomy and Rt pacemaker scar tissue but avoided area of current pace maker placement on Lt  03/30/2024 Discussed POC, LOS, treatment interventions.. Instructed in supine wand exs for flexion and scaption x 5 ea and supine stargazer x 5. Updated HEP with illustrated and written  instructions, also showed scapular retraction, but forgot to give pic. Discussed new charging rule for DN.   PATIENT EDUCATION:  Education details: supine  wand exs for flexion and scaption x 5 ea and supine stargazer. Person educated: Patient Education method: Explanation, Demonstration, and Handouts Education comprehension: verbalized understanding and returned demonstration  HOME EXERCISE PROGRAM: supine wand exs for flexion and scaption x 5 ea and supine stargazer x 5 to perform 2x/day  ASSESSMENT:  CLINICAL IMPRESSION: Pt has made good improvement in Bilateral shoulder AROM, but continues to be most limited with left shoulder AROM due to pain in the left chest region. She has achieved STG's except decreased pain for left chest  region by 25%.  OBJECTIVE IMPAIRMENTS: decreased activity tolerance, decreased knowledge of condition, decreased ROM, decreased strength, increased fascial restrictions, impaired flexibility, impaired UE functional use, postural dysfunction, and pain.   ACTIVITY LIMITATIONS: carrying, sleeping, dressing, reach over head, and hygiene/grooming  PARTICIPATION LIMITATIONS: cleaning, laundry, yard work, and activities requiring significant use of arms  PERSONAL FACTORS: 3+ comorbidities: Multiple breast surgeries, chemo, radiation, pacemaker swithced right to left are also affecting patient's functional outcome.   REHAB POTENTIAL: Good  CLINICAL DECISION MAKING: Evolving/moderate complexity  EVALUATION COMPLEXITY: Moderate  GOALS: Goals reviewed with patient? Yes  SHORT TERM GOALS: Target date: 04/20/2024  Pt will be independent in a HEP for bilateral shoulder ROM/strength Baseline: Goal status: MET 04/20/2024  2.  Pt will have decreased chest/arm pain by 25% Baseline:  Goal status: MET 6/30 Left arm, Right chest, In progress for Left chest 3.  Pt will improve Shoulder AROM for Left flexion and  B abd  by 25-30 degrees Baseline:  Goal status: MET  04/20/2024  LONG TERM GOALS: Target date: 05/11/2024  Pt will have bilateral shoulder ROM for flexion and scaption to 125 degrees for improved reaching abiliy Baseline:  Goal status: INITIAL  2.  Pt will report decreased chest and left UE pain by 50% or greater Baseline:  Goal status: INITIAL  3.  Quick dash will improve to no greater than 20% to demonstrate improved function Baseline:  Goal status: INITIAL  4.  Pt will have improved sleep by 50% or more to be more rested during the day Baseline:  Goal status: INITIAL   PLAN:  PT FREQUENCY: 2x/week  PT DURATION: 6 weeks  PLANNED INTERVENTIONS: 97164- PT Re-evaluation, 97750- Physical Performance Testing, 97110-Therapeutic exercises, 97530- Therapeutic activity, [id number]- Neuromuscular re-education, 97535- Self Care, [id number]- Manual therapy, 20560 (1-2 muscles), 20561 (3+ muscles)- Dry Needling, and Patient/Family education  PLAN FOR NEXT SESSION: Review HEP, STM bilateral UT, axillary border of pecs, sternal border as appropriate,Careful on left due to Pacemaker. MFR to bilateral chest areas, Mastectomy scar massage,cupping,PROM, update HEP, Progress to band exercises.  Grayce JINNY Sheldon, PT 04/20/2024, 11:13 AM

## 2024-04-22 ENCOUNTER — Ambulatory Visit

## 2024-04-27 ENCOUNTER — Ambulatory Visit: Attending: Hematology and Oncology

## 2024-04-27 DIAGNOSIS — C50412 Malignant neoplasm of upper-outer quadrant of left female breast: Secondary | ICD-10-CM | POA: Diagnosis not present

## 2024-04-27 DIAGNOSIS — R293 Abnormal posture: Secondary | ICD-10-CM | POA: Insufficient documentation

## 2024-04-27 DIAGNOSIS — R0789 Other chest pain: Secondary | ICD-10-CM | POA: Insufficient documentation

## 2024-04-27 DIAGNOSIS — Z9013 Acquired absence of bilateral breasts and nipples: Secondary | ICD-10-CM | POA: Diagnosis not present

## 2024-04-27 DIAGNOSIS — Z171 Estrogen receptor negative status [ER-]: Secondary | ICD-10-CM | POA: Insufficient documentation

## 2024-04-27 DIAGNOSIS — M25512 Pain in left shoulder: Secondary | ICD-10-CM | POA: Insufficient documentation

## 2024-04-27 DIAGNOSIS — Z483 Aftercare following surgery for neoplasm: Secondary | ICD-10-CM | POA: Diagnosis not present

## 2024-04-27 NOTE — Therapy (Signed)
 OUTPATIENT PHYSICAL THERAPY  UPPER EXTREMITY ONCOLOGY TREATMENT  Patient Name: KATHERIN RAMEY MRN: [id number] DOB:[birth year], 73 y.o., female Today's Date: 04/27/2024  END OF SESSION:  PT End of Session - 04/27/24 1101     Visit Number 7    Number of Visits 12    Date for PT Re-Evaluation 05/11/24    Authorization Type none needed    PT Start Time 1102    PT Stop Time 1155    PT Time Calculation (min) 53 min    Activity Tolerance Patient tolerated treatment well    Behavior During Therapy WFL for tasks assessed/performed          Past Medical History:  Diagnosis Date   AICD (automatic cardioverter/defibrillator) present    Anemia    Anginal pain (HCC)    Anxiety    Arthritis    Lumbar spine DDD.   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   Diabetes mellitus without complication (HCC)    Type II   Dyspnea    When patient gets sick uses Pro-Air inhaler   Frequent sinus infections    GERD (gastroesophageal reflux disease)    occ   Goiter    History of cardiomegaly    History of cataract    Bilateral   History of kidney stones    08/28/2018 currently has a large kidney stone, asymptomatic at this time   History of migraine    History of uterine fibroid    History of uterine prolapse    Hyperlipidemia    Hypertension    LBBB (left bundle branch block)    Menopause 03/03/2012   Nasal turbinate hypertrophy 11/29/2016   Left inferior    Personal history of chemotherapy    Personal history of radiation therapy    Pneumonia    as a child   PONV (postoperative nausea and vomiting)    Sinusitis 2014   Sleep apnea    No CPAP   SUI (stress urinary incontinence, female)    SVD (spontaneous vaginal delivery)    x 2   Thyroid  nodule    Past Surgical History:  Procedure Laterality Date    ABDOMINAL HYSTERECTOMY     BILATERAL SALPINGOOPHORECTOMY Bilateral    BIV ICD INSERTION CRT-D N/A 08/24/2022   Procedure: BIV ICD INSERTION CRT-D;  Surgeon: Waddell Danelle ORN, MD;  Location: [facility] INVASIVE CV LAB;  Service: Cardiovascular;  Laterality: N/A;   BIV ICD INSERTION CRT-D N/A 11/07/2023   Procedure: BIV ICD INSERTION CRT-D;  Surgeon: Waddell Danelle ORN, MD;  Location: [facility] INVASIVE CV LAB;  Service: Cardiovascular;  Laterality: N/A;   BLADDER SUSPENSION N/A 05/16/2017   Procedure: TRANSVAGINAL TAPE (TVT) PROCEDURE;  Surgeon: Henry Slough, MD;  Location: [facility] ORS;  Service: Gynecology;  Laterality: N/A;   BREAST BIOPSY Right 2009   BREAST IMPLANT REMOVAL Bilateral 02/05/2022   Procedure: Removal bilateral breast implants;  Surgeon: Elisabeth Craig RAMAN, MD;  Location: [facility] OR;  Service: Plastics;  Laterality: Bilateral;   BREAST LUMPECTOMY Right 2009   BREAST LUMPECTOMY Left 2006   BREAST RECONSTRUCTION WITH PLACEMENT OF TISSUE EXPANDER AND FLEX HD (ACELLULAR HYDRATED DERMIS) Bilateral 09/04/2021   Procedure: BILATERAL BREAST RECONSTRUCTION WITH PLACEMENT OF TISSUE EXPANDER AND FLEX HD (ACELLULAR HYDRATED DERMIS);  Surgeon: Elisabeth Craig RAMAN, MD;  Location:  [facility] OR;  Service: Plastics;  Laterality: Bilateral;   CARDIAC CATHETERIZATION     CATARACT EXTRACTION Left 10/23/2017   CATARACT EXTRACTION Right 11/06/2017   COLONOSCOPY  10/22/2009   Normal.  Repeat 5 years.  [name]   COLONOSCOPY WITH PROPOFOL  N/A 12/21/2022   Procedure: COLONOSCOPY WITH PROPOFOL ;  Surgeon: Rollin Dover, MD;  Location: [facility]OS[facility];  Service: Gastroenterology;  Laterality: N/A;   CYSTOSCOPY N/A 05/16/2017   Procedure: CYSTOSCOPY;  Surgeon: Henry Slough, MD;  Location: [facility] ORS;  Service: Gynecology;  Laterality: N/A;   DEBRIDEMENT AND CLOSURE WOUND Bilateral 10/03/2021   Procedure: Debridement bilateral mastectomy flaps;  Surgeon: Elisabeth Craig RAMAN, MD;  Location: [facility] OR;  Service: Plastics;  Laterality: Bilateral;  1.5 hour   EYE  SURGERY     bilateral cataracts   LEAD EXTRACTION N/A 11/07/2023   Procedure: LEAD EXTRACTION;  Surgeon: Waddell Danelle ORN, MD;  Location: [facility] INVASIVE CV LAB;  Service: Cardiovascular;  Laterality: N/A;   LEFT HEART CATH AND CORONARY ANGIOGRAPHY N/A 02/09/2020   Procedure: LEFT HEART CATH AND CORONARY ANGIOGRAPHY;  Surgeon: Elmira Newman PARAS, MD;  Location: [facility] INVASIVE CV LAB;  Service: Cardiovascular;  Laterality: N/A;   LEFT HEART CATH AND CORONARY ANGIOGRAPHY N/A 03/27/2022   Procedure: LEFT HEART CATH AND CORONARY ANGIOGRAPHY;  Surgeon: Ladona Heinz, MD;  Location: [facility] INVASIVE CV LAB;  Service: Cardiovascular;  Laterality: N/A;   MASS EXCISION Right 12/04/2022   Procedure: EXCISION OF RIGHT CHEST WALL MASS;  Surgeon: Aron Shoulders, MD;  Location: [facility] OR;  Service: General;  Laterality: Right;   MASTECTOMY W/ SENTINEL NODE BIOPSY Right 09/04/2021   Procedure: BILATERAL MASTECTOMIES WITH RIGHT SENTINEL LYMPH NODE BIOPSY;  Surgeon: Aron Shoulders, MD;  Location: [facility] OR;  Service: General;  Laterality: Right;   REMOVAL OF BILATERAL TISSUE EXPANDERS WITH PLACEMENT OF BILATERAL BREAST IMPLANTS Bilateral 10/03/2021   Procedure: REMOVAL OF BILATERAL TISSUE EXPANDERS WITH REPLACEMENT OF TISSUE EXPANDERS;  Surgeon: Elisabeth Craig RAMAN, MD;  Location: [facility] OR;  Service: Plastics;  Laterality: Bilateral;   REMOVAL OF BILATERAL TISSUE EXPANDERS WITH PLACEMENT OF BILATERAL BREAST IMPLANTS Bilateral 12/19/2021   Procedure: REMOVAL OF BILATERAL TISSUE EXPANDERS WITH PLACEMENT OF BILATERAL BREAST IMPLANTS;  Surgeon: Elisabeth Craig RAMAN, MD;  Location: [facility] OR;  Service: Plastics;  Laterality: Bilateral;   ROBOTIC ASSISTED TOTAL HYSTERECTOMY N/A 05/16/2017   Procedure: ROBOTIC ASSISTED TOTAL HYSTERECTOMY;  Surgeon: Darcel Pool, MD;  Location: [facility] OR[facility];  Service: Gynecology;  Laterality: N/A;   ROTATOR CUFF REPAIR Bilateral    SCAR REVISION N/A 12/04/2022   Procedure: REVISION OF MASTECTOMY SCAR;  Surgeon: Aron Shoulders, MD;   Location: [facility] OR;  Service: General;  Laterality: N/A;   SHOULDER ARTHROSCOPY WITH ROTATOR CUFF REPAIR AND SUBACROMIAL DECOMPRESSION Right 09/03/2018   Procedure: Right shoulder manipulation under anesthesia, exam under anesthesia, mini open rotator cuff repair, subacromial decompression;  Surgeon: Duwayne Purchase, MD;  Location: [facility] ORS;  Service: Orthopedics;  Laterality: Right;  90 mins   TRANSESOPHAGEAL ECHOCARDIOGRAM (CATH LAB) N/A 11/07/2023   Procedure: TRANSESOPHAGEAL ECHOCARDIOGRAM;  Surgeon: Waddell Danelle ORN, MD;  Location: [facility] INVASIVE CV LAB;  Service: Cardiovascular;  Laterality: N/A;   TUBAL LIGATION     WISDOM TOOTH EXTRACTION     Patient Active Problem List   Diagnosis Date Noted   Chronic systolic heart failure (HCC) 11/07/2023   ICD (implantable cardioverter-defibrillator) in place 12/25/2022   Bradycardia 08/24/2022   BRCA2 gene mutation positive in female 07/30/2022   LBBB (left bundle branch block)  Chronic diastolic heart failure (HCC)    NICM (nonischemic cardiomyopathy) (HCC)    Breast cancer of upper-outer quadrant of right female breast (HCC) 09/04/2021   Diabetic neuropathy (HCC) 02/08/2021   Angina pectoris (HCC) 02/09/2020   Abnormal stress test 02/09/2020   Complete rotator cuff tear 09/03/2018   Pelvic prolapse 05/16/2017   Intractable chronic post-traumatic headache 11/19/2016   Malignant neoplasm of upper-outer quadrant of left breast in female, estrogen receptor negative (HCC) 03/03/2012   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   Menopause 03/03/2012    PCP: Ryan Hives, MD  REFERRING PROVIDER: Mackey Chad, MD  REFERRING DIAG: s/p breast Cancer/ shoulder pain  THERAPY DIAG:  History of bilateral mastectomy  Malignant neoplasm of upper-outer quadrant of left breast in female, estrogen receptor negative (HCC)  Left shoulder pain, unspecified chronicity  Abnormal posture  Muscular chest pain  Aftercare following surgery  for neoplasm  ONSET DATE: 11/07/2023  Rationale for Evaluation and Treatment: Rehabilitation  SUBJECTIVE:                                                                                                                                                                                           SUBJECTIVE STATEMENT:   Feeling better but not all the way yet. I'm having some spasm in my left outer lower leg that started this am. I rubbed some Hempvanna on it. Its not hurting as bad now.   PERTINENT HISTORY:   Pt was diagnosed with right breast cancer dx 05/2021. She has BRCA2 genetic mutation and has had left breast cancer in 2006 with chemo/breast conservation and XRT. She then had right breast cancer (dcis)  in 2009 with lumpectomy and radiation only. In 2022 she presented  with a palpable painful mass in the upper outer right breast. Dx imaging showed a 1.2 cm mass at 10 o'clock. 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  Pt underwent bilateral mastectomies with right sentinel node biopsy and expander reconstruction (Pace) 09/04/21. She had 6 negative nodes and negative margins. Patient ended up having the expanders removed 01/26/2022 because of continued drainage and discomfort.  2.14.2024 : revision of left mastectomy scar and removal of mass on right lateral mastectomy scar. Core needle biopsy was performed and this demonstrated invasive ductal carcinoma, grade 3. This was weakly ER positive, PR negative, HER2 positive, Ki-67 70%. She had 11 cycles of Breast Maintenane Trastuzumab    Pt also has a Investment banker, corporate.  She has a decrease ejection fraction of 20- 30% .   PAIN:  Are you having pain? Yes NPRS scale: 5/10 present on  left and 3/10 on right, left leg 7/10 Pain location: bilateral chest greater on left and left upper arm Pain orientation: Bilateral  PAIN TYPE: aching, dull, sharp, and tight Pain description: constant on the left chest, but can be  sharp,  Aggravating factors: sleeping on left, reaching Relieving factors: massaging, topical, ibuprofen   PRECAUTIONS: at risk for lymphedema , pacemaker, defibrillator, DM, Right RTC repair 2019, cardiomyopathy, LV EF 20-25%  RED FLAGS: None   WEIGHT BEARING RESTRICTIONS: No  FALLS:  Has patient fallen in last 6 months? No  LIVING ENVIRONMENT: Lives with: pts mom lives with her and her daughter temporarily Lives in: House/apartment  OCCUPATION: does not work  LEISURE: walks 3 days /week, can walk up to 2 miles with breaks  HAND DOMINANCE: right   PRIOR LEVEL OF FUNCTION: Independent  PATIENT GOALS: Decrease pain, improve ROM   OBJECTIVE: Note: Objective measures were completed at Evaluation unless otherwise noted.  COGNITION: Overall cognitive status: Within functional limits for tasks assessed   PALPATION: Tender bilateral UT, pectorals, incision areas from pacemaker B  OBSERVATIONS / OTHER ASSESSMENTS: Bilateral mastectomy incisions healed but very restricted, keloid scar right chest from pacemaker removal, and new keloid scar on left chest from new pacemaker.defibrillator placement.  SENSATION: Light touch: Deficits     POSTURE: forward head rounded shoulders   UPPER EXTREMITY AROM/PROM:  A/PROM RIGHT   eval  RIGHT 04/20/2024  Shoulder extension 48 51  Shoulder flexion 127(pulls chest) 146  Shoulder abduction 110, pain pulling chest 146  Shoulder internal rotation    Shoulder external rotation      (Blank rows = not tested)  A/PROM LEFT   eval LEFT 04/20/2024  Shoulder extension 40, tight 46  Shoulder flexion 65 (tight/pain chest) 102, chest pain  Shoulder abduction 70 ( tight chest) 115  chest pain  Shoulder internal rotation    Shoulder external rotation      (Blank rows = not tested)  CERVICAL AROM: All within functional limits: Limited 40-50% with Bilateral rotation and SB     UPPER EXTREMITY STRENGTH:   LYMPHEDEMA ASSESSMENTS:    SURGERY TYPE/DATE: Multiple surgeries 2006, 2013 2022, 2024 B.Mastectomies with reconstruction, Implants removed 2023 due to non healing wound,  NUMBER OF LYMPH NODES REMOVED: 6 right  CHEMOTHERAPY: YES, ongoing  RADIATION:YEs  HORMONE TREATMENT: Yes, Anastrozole   INFECTIONS: non healing wound requiring removal of implants    LYMPHEDEMA ASSESSMENTS:   LANDMARK RIGHT  eval  At axilla    15 cm proximal to olecranon process   10 cm proximal to olecranon process   Olecranon process   15 cm proximal to ulnar styloid process   10 cm proximal to ulnar styloid process   Just proximal to ulnar styloid process   Across hand at thumb web space   At base of 2nd digit   (Blank rows = not tested)  LANDMARK LEFT  eval  At axilla    15 cm proximal to olecranon process   10 cm proximal to olecranon process   Olecranon process   15 cm proximal to ulnar styloid process   10 cm proximal to ulnar styloid process   Just proximal to ulnar styloid process   Across hand at thumb web space   At base of 2nd digit   (Blank rows = not tested)   FUNCTIONAL TESTS:    GAIT: WNL   QUICK DASH SURVEY: 36.36  TREATMENT DATE:  04/27/2024 Instructed in standing postural exercises;scapular retraction, shoulder extension, and bilateral ER x 10 with yellow band Single arm pec stretch at doorway x 3 ea, 20 sec Supine wand flexion and scaption x 3, 5 sec hold with exhale at the end Star gazer x 5, 5 sec hold STM to bilateral UT, Right pectorals and bilateral lateral trunk with cocoa butter, scar massage left mastectomy incision PROM bilateral shoulder flex, scaption, abd, IR and ER with VC's prn to relax mainly on left Updated HEP with band exs and advised to do 3x/week and not to the exclusion of her stretches  04/20/2024 Supine wand flexion and scaption x 5, 5 sec hold with  exhale at the end Star gazer x 5 Snow angels x 5 B, 5 sec hold STM to bilateral UT, Right pectorals and bilateral lateral trunk with cocoa butter, scar massage left mastectomy incision PROM bilateral shoulder flex, scaption, abd, IR and ER with VC's prn to relax 3 D AROM Flexion, scaption, horizontal abd x 3 Measured AROM bilateral shoulders and checked STG's for pt. Progress. 04/16/2024 Supine wand flexion and scaption x 5, 5 sec hold with exhale at the end Star gazer x 5 Snow angels x 5 B, 5 sec hold 3D bilateral flexion, scaption, horizontal abd x 4 MFR to bilateral chest regions avoiding pacemaker on left,  STM to bilateral UT, Right pectorals and bilateral lateral trunk with cocoa butter, scar massage left mastectomy incision  04/13/2024 Supine wand flexion and scaption x 5, 5 sec hold with exhale at the end Star gazer x 5 Snow angels x 5 B, 5 sec hold MFR to bilateral chest regions avoiding pacemaker on left,  STM to bilateral UT, Right pectorals and bilateral lateral trunk with cocoa butter, scar massage left mastectomy incision PROM bilateral shoulder flex, scaption, abd, IR and ER with VC's prn to relax Showed SB stretch to stretch lateral trunk  04/08/2024 B Scapular retraction x 5 Bilateral shoulder abd x 4 with wall slides Supine wand flexion and scaption x 3, 5 sec hold with exhale at the end [facility] angels x 5 B, 5 sec hold MFR to bilateral chest regions avoiding pacemaker on left PROM bilateral shoulder flex, scaption, abd, IR and ER with VC's prn to relax 04/02/24: Therapeutic Exercises Pulleys into flex x 2 mins and abd x 3 mins with pt returning therapist demo and VC's during to relax shoulders. Pt reported starting to feel increased discomfort at pacemaker site Manual Therapy P/ROM in supine to bil shoulders into flex, abd and D2, carefully on Lt side due to pacemaker, scapular depression by therapist throughout STM over areas of mastectomy and Rt pacemaker scar tissue  but avoided area of current pace maker placement on Lt  03/30/2024 Discussed POC, LOS, treatment interventions.. Instructed in supine wand exs for flexion and scaption x 5 ea and supine stargazer x 5. Updated HEP with illustrated and written instructions, also showed scapular retraction, but forgot to give pic. Discussed new charging rule for DN.   PATIENT EDUCATION:  Education details: supine wand exs for flexion and scaption x 5 ea and supine stargazer. Person educated: Patient Education method: Explanation, Demonstration, and Handouts Education comprehension: verbalized understanding and returned demonstration  HOME EXERCISE PROGRAM: supine wand exs for flexion and scaption x 5 ea and supine stargazer x 5 to perform 2x/day Standing postural exs with band, SR, ext , ER with yelow  ASSESSMENT:  CLINICAL IMPRESSION: Pt did well with her ROM exercises and started  at a better place with left shoulder ROM. She felt looser after treaatment, and her leg pain was improved as well after lying with the bolster under her knees.  OBJECTIVE IMPAIRMENTS: decreased activity tolerance, decreased knowledge of condition, decreased ROM, decreased strength, increased fascial restrictions, impaired flexibility, impaired UE functional use, postural dysfunction, and pain.   ACTIVITY LIMITATIONS: carrying, sleeping, dressing, reach over head, and hygiene/grooming  PARTICIPATION LIMITATIONS: cleaning, laundry, yard work, and activities requiring significant use of arms  PERSONAL FACTORS: 3+ comorbidities: Multiple breast surgeries, chemo, radiation, pacemaker swithced right to left are also affecting patient's functional outcome.   REHAB POTENTIAL: Good  CLINICAL DECISION MAKING: Evolving/moderate complexity  EVALUATION COMPLEXITY: Moderate  GOALS: Goals reviewed with patient? Yes  SHORT TERM GOALS: Target date: 04/20/2024  Pt will be independent in a HEP for bilateral shoulder  ROM/strength Baseline: Goal status: MET 04/20/2024  2.  Pt will have decreased chest/arm pain by 25% Baseline:  Goal status: MET 6/30 Left arm, Right chest, In progress for Left chest 3.  Pt will improve Shoulder AROM for Left flexion and  B abd  by 25-30 degrees Baseline:  Goal status: MET 04/20/2024  LONG TERM GOALS: Target date: 05/11/2024  Pt will have bilateral shoulder ROM for flexion and scaption to 125 degrees for improved reaching abiliy Baseline:  Goal status: INITIAL  2.  Pt will report decreased chest and left UE pain by 50% or greater Baseline:  Goal status: INITIAL  3.  Quick dash will improve to no greater than 20% to demonstrate improved function Baseline:  Goal status: INITIAL  4.  Pt will have improved sleep by 50% or more to be more rested during the day Baseline:  Goal status: INITIAL   PLAN:  PT FREQUENCY: 2x/week  PT DURATION: 6 weeks  PLANNED INTERVENTIONS: 97164- PT Re-evaluation, 97750- Physical Performance Testing, 97110-Therapeutic exercises, 97530- Therapeutic activity, [id number]- Neuromuscular re-education, 97535- Self Care, [id number]- Manual therapy, 20560 (1-2 muscles), 20561 (3+ muscles)- Dry Needling, and Patient/Family education  PLAN FOR NEXT SESSION: Review bnad, STM bilateral UT, axillary border of pecs, sternal border as appropriate,Careful on left due to Pacemaker. MFR to bilateral chest areas, Mastectomy scar massage,cupping,PROM, update HEP, Progress to band exercises.  Grayce JINNY Sheldon, PT 04/27/2024, 11:56 AM

## 2024-05-01 ENCOUNTER — Ambulatory Visit: Admitting: Rehabilitation

## 2024-05-01 ENCOUNTER — Encounter: Payer: Self-pay | Admitting: Rehabilitation

## 2024-05-01 DIAGNOSIS — R0789 Other chest pain: Secondary | ICD-10-CM

## 2024-05-01 DIAGNOSIS — R293 Abnormal posture: Secondary | ICD-10-CM | POA: Diagnosis not present

## 2024-05-01 DIAGNOSIS — C50412 Malignant neoplasm of upper-outer quadrant of left female breast: Secondary | ICD-10-CM | POA: Diagnosis not present

## 2024-05-01 DIAGNOSIS — Z9013 Acquired absence of bilateral breasts and nipples: Secondary | ICD-10-CM

## 2024-05-01 DIAGNOSIS — Z483 Aftercare following surgery for neoplasm: Secondary | ICD-10-CM

## 2024-05-01 DIAGNOSIS — Z171 Estrogen receptor negative status [ER-]: Secondary | ICD-10-CM | POA: Diagnosis not present

## 2024-05-01 DIAGNOSIS — M25512 Pain in left shoulder: Secondary | ICD-10-CM

## 2024-05-01 NOTE — Therapy (Signed)
 OUTPATIENT PHYSICAL THERAPY  UPPER EXTREMITY ONCOLOGY TREATMENT  Patient Name: Jordan Willis MRN: [id number] DOB:1950-11-27, 73 y.o., female Today's Date: 05/01/2024  END OF SESSION:  PT End of Session - 05/01/24 1200     Visit Number 8    Number of Visits 12    Date for PT Re-Evaluation 05/11/24    PT Start Time 1107    PT Stop Time 1158    PT Time Calculation (min) 51 min    Activity Tolerance Patient tolerated treatment well    Behavior During Therapy WFL for tasks assessed/performed           Past Medical History:  Diagnosis Date   AICD (automatic cardioverter/defibrillator) present    Anemia    Anginal pain (HCC)    Anxiety    Arthritis    Lumbar spine DDD.   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   Diabetes mellitus without complication (HCC)    Type II   Dyspnea    When patient gets sick uses Pro-Air inhaler   Frequent sinus infections    GERD (gastroesophageal reflux disease)    occ   Goiter    History of cardiomegaly    History of cataract    Bilateral   History of kidney stones    08/28/2018 currently has a large kidney stone, asymptomatic at this time   History of migraine    History of uterine fibroid    History of uterine prolapse    Hyperlipidemia    Hypertension    LBBB (left bundle branch block)    Menopause 03/03/2012   Nasal turbinate hypertrophy 11/29/2016   Left inferior    Personal history of chemotherapy    Personal history of radiation therapy    Pneumonia    as a child   PONV (postoperative nausea and vomiting)    Sinusitis 2014   Sleep apnea    No CPAP   SUI (stress urinary incontinence, female)    SVD (spontaneous vaginal delivery)    x 2   Thyroid  nodule    Past Surgical History:  Procedure Laterality Date   ABDOMINAL HYSTERECTOMY      BILATERAL SALPINGOOPHORECTOMY Bilateral    BIV ICD INSERTION CRT-D N/A 08/24/2022   Procedure: BIV ICD INSERTION CRT-D;  Surgeon: Waddell Danelle ORN, MD;  Location: [facility] INVASIVE CV LAB;  Service: Cardiovascular;  Laterality: N/A;   BIV ICD INSERTION CRT-D N/A 11/07/2023   Procedure: BIV ICD INSERTION CRT-D;  Surgeon: Waddell Danelle ORN, MD;  Location: [facility] INVASIVE CV LAB;  Service: Cardiovascular;  Laterality: N/A;   BLADDER SUSPENSION N/A 05/16/2017   Procedure: TRANSVAGINAL TAPE (TVT) PROCEDURE;  Surgeon: Henry Slough, MD;  Location: [facility] ORS;  Service: Gynecology;  Laterality: N/A;   BREAST BIOPSY Right 2009   BREAST IMPLANT REMOVAL Bilateral 02/05/2022   Procedure: Removal bilateral breast implants;  Surgeon: Elisabeth Craig RAMAN, MD;  Location: [facility] OR;  Service: Plastics;  Laterality: Bilateral;   BREAST LUMPECTOMY Right 2009   BREAST LUMPECTOMY Left 2006   BREAST RECONSTRUCTION WITH PLACEMENT OF TISSUE EXPANDER AND FLEX HD (ACELLULAR HYDRATED DERMIS) Bilateral 09/04/2021   Procedure: BILATERAL BREAST RECONSTRUCTION WITH PLACEMENT OF TISSUE EXPANDER AND FLEX HD (ACELLULAR HYDRATED DERMIS);  Surgeon: Elisabeth Craig RAMAN, MD;  Location: [facility] OR;  Service: Plastics;  Laterality: Bilateral;   CARDIAC CATHETERIZATION     CATARACT EXTRACTION Left 10/23/2017   CATARACT EXTRACTION Right 11/06/2017   COLONOSCOPY  10/22/2009   Normal.  Repeat 5 years.  [name]   COLONOSCOPY WITH PROPOFOL  N/A 12/21/2022   Procedure: COLONOSCOPY WITH PROPOFOL ;  Surgeon: Rollin Dover, MD;  Location: [facility];  Service: Gastroenterology;  Laterality: N/A;   CYSTOSCOPY N/A 05/16/2017   Procedure: CYSTOSCOPY;  Surgeon: Henry Slough, MD;  Location: [facility] ORS;  Service: Gynecology;  Laterality: N/A;   DEBRIDEMENT AND CLOSURE WOUND Bilateral 10/03/2021   Procedure: Debridement bilateral mastectomy flaps;  Surgeon: Elisabeth Craig RAMAN, MD;  Location: [facility] OR;  Service: Plastics;  Laterality: Bilateral;  1.5 hour   EYE SURGERY     bilateral  cataracts   LEAD EXTRACTION N/A 11/07/2023   Procedure: LEAD EXTRACTION;  Surgeon: Waddell Danelle ORN, MD;  Location: [facility] INVASIVE CV LAB;  Service: Cardiovascular;  Laterality: N/A;   LEFT HEART CATH AND CORONARY ANGIOGRAPHY N/A 02/09/2020   Procedure: LEFT HEART CATH AND CORONARY ANGIOGRAPHY;  Surgeon: Elmira Newman PARAS, MD;  Location: [facility] INVASIVE CV LAB;  Service: Cardiovascular;  Laterality: N/A;   LEFT HEART CATH AND CORONARY ANGIOGRAPHY N/A 03/27/2022   Procedure: LEFT HEART CATH AND CORONARY ANGIOGRAPHY;  Surgeon: Ladona Heinz, MD;  Location: [facility] INVASIVE CV LAB;  Service: Cardiovascular;  Laterality: N/A;   MASS EXCISION Right 12/04/2022   Procedure: EXCISION OF RIGHT CHEST WALL MASS;  Surgeon: Aron Shoulders, MD;  Location: [facility] OR;  Service: General;  Laterality: Right;   MASTECTOMY W/ SENTINEL NODE BIOPSY Right 09/04/2021   Procedure: BILATERAL MASTECTOMIES WITH RIGHT SENTINEL LYMPH NODE BIOPSY;  Surgeon: Aron Shoulders, MD;  Location: [facility] OR;  Service: General;  Laterality: Right;   REMOVAL OF BILATERAL TISSUE EXPANDERS WITH PLACEMENT OF BILATERAL BREAST IMPLANTS Bilateral 10/03/2021   Procedure: REMOVAL OF BILATERAL TISSUE EXPANDERS WITH REPLACEMENT OF TISSUE EXPANDERS;  Surgeon: Elisabeth Craig RAMAN, MD;  Location: [facility] OR;  Service: Plastics;  Laterality: Bilateral;   REMOVAL OF BILATERAL TISSUE EXPANDERS WITH PLACEMENT OF BILATERAL BREAST IMPLANTS Bilateral 12/19/2021   Procedure: REMOVAL OF BILATERAL TISSUE EXPANDERS WITH PLACEMENT OF BILATERAL BREAST IMPLANTS;  Surgeon: Elisabeth Craig RAMAN, MD;  Location: [facility] OR;  Service: Plastics;  Laterality: Bilateral;   ROBOTIC ASSISTED TOTAL HYSTERECTOMY N/A 05/16/2017   Procedure: ROBOTIC ASSISTED TOTAL HYSTERECTOMY;  Surgeon: Darcel Pool, MD;  Location: [facility] ORS;  Service: Gynecology;  Laterality: N/A;   ROTATOR CUFF REPAIR Bilateral    SCAR REVISION N/A 12/04/2022   Procedure: REVISION OF MASTECTOMY SCAR;  Surgeon: Aron Shoulders, MD;  Location: [facility] OR;  Service:  General;  Laterality: N/A;   SHOULDER ARTHROSCOPY WITH ROTATOR CUFF REPAIR AND SUBACROMIAL DECOMPRESSION Right 09/03/2018   Procedure: Right shoulder manipulation under anesthesia, exam under anesthesia, mini open rotator cuff repair, subacromial decompression;  Surgeon: Duwayne Purchase, MD;  Location: [facility];  Service: Orthopedics;  Laterality: Right;  90 mins   TRANSESOPHAGEAL ECHOCARDIOGRAM (CATH LAB) N/A 11/07/2023   Procedure: TRANSESOPHAGEAL ECHOCARDIOGRAM;  Surgeon: Waddell Danelle ORN, MD;  Location: [facility] INVASIVE CV LAB;  Service: Cardiovascular;  Laterality: N/A;   TUBAL LIGATION     WISDOM TOOTH EXTRACTION     Patient Active Problem List   Diagnosis Date Noted   Chronic systolic heart failure (HCC) 11/07/2023   ICD (implantable cardioverter-defibrillator) in place 12/25/2022   Bradycardia 08/24/2022   BRCA2 gene mutation positive in female 07/30/2022   LBBB (left bundle branch block)    Chronic diastolic heart  failure [facility])    NICM (nonischemic cardiomyopathy) (HCC)    Breast cancer of upper-outer quadrant of right female breast (HCC) 09/04/2021   Diabetic neuropathy (HCC) 02/08/2021   Angina pectoris (HCC) 02/09/2020   Abnormal stress test 02/09/2020   Complete rotator cuff tear 09/03/2018   Pelvic prolapse 05/16/2017   Intractable chronic post-traumatic headache 11/19/2016   Malignant neoplasm of upper-outer quadrant of left breast in female, estrogen receptor negative (HCC) 03/03/2012   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   Menopause 03/03/2012    PCP: Ryan Hives, MD  REFERRING PROVIDER: Mackey Chad, MD  REFERRING DIAG: s/p breast Cancer/ shoulder pain  THERAPY DIAG:  History of bilateral mastectomy  Malignant neoplasm of upper-outer quadrant of left breast in female, estrogen receptor negative (HCC)  Left shoulder pain, unspecified chronicity  Abnormal posture  Muscular chest pain  Aftercare following surgery for neoplasm  ONSET DATE:  11/07/2023  Rationale for Evaluation and Treatment: Rehabilitation  SUBJECTIVE:                                                                                                                                                                                           SUBJECTIVE STATEMENT:  Terrible headache today.    PERTINENT HISTORY:   Pt was diagnosed with right breast cancer dx 05/2021. She has BRCA2 genetic mutation and has had left breast cancer in 2006 with chemo/breast conservation and XRT. She then had right breast cancer (dcis)  in 2009 with lumpectomy and radiation only. In 2022 she presented  with a palpable painful mass in the upper outer right breast. Dx imaging showed a 1.2 cm mass at 10 o'clock. 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  Pt underwent bilateral mastectomies with right sentinel node biopsy and expander reconstruction (Pace) 09/04/21. She had 6 negative nodes and negative margins. Patient ended up having the expanders removed 01/26/2022 because of continued drainage and discomfort.  2.14.2024 : revision of left mastectomy scar and removal of mass on right lateral mastectomy scar. Core needle biopsy was performed and this demonstrated invasive ductal carcinoma, grade 3. This was weakly ER positive, PR negative, HER2 positive, Ki-67 70%. She had 11 cycles of Breast Maintenane Trastuzumab    Pt also has a Investment banker, corporate.  She has a decrease ejection fraction of 20- 30% .   PAIN:  Are you having pain? Yes NPRS scale: 5/10 present on left and 3/10 on right Pain location: bilateral chest greater on left and left upper arm Pain orientation: Bilateral  PAIN TYPE: aching, dull, sharp, and tight Pain description: constant on the left chest,  but can be sharp,  Aggravating factors: sleeping on left, reaching Relieving factors: massaging, topical, ibuprofen   PRECAUTIONS: at risk for lymphedema , pacemaker, defibrillator, DM, Right RTC repair  2019, cardiomyopathy, LV EF 20-25%  RED FLAGS: None   WEIGHT BEARING RESTRICTIONS: No  FALLS:  Has patient fallen in last 6 months? No  LIVING ENVIRONMENT: Lives with: pts mom lives with her and her daughter temporarily Lives in: House/apartment  OCCUPATION: does not work  LEISURE: walks 3 days /week, can walk up to 2 miles with breaks  HAND DOMINANCE: right   PRIOR LEVEL OF FUNCTION: Independent  PATIENT GOALS: Decrease pain, improve ROM   OBJECTIVE: Note: Objective measures were completed at Evaluation unless otherwise noted.  COGNITION: Overall cognitive status: Within functional limits for tasks assessed   PALPATION: Tender bilateral UT, pectorals, incision areas from pacemaker B  OBSERVATIONS / OTHER ASSESSMENTS: Bilateral mastectomy incisions healed but very restricted, keloid scar right chest from pacemaker removal, and new keloid scar on left chest from new pacemaker.defibrillator placement.  SENSATION: Light touch: Deficits     POSTURE: forward head rounded shoulders   UPPER EXTREMITY AROM/PROM:  A/PROM RIGHT   eval  RIGHT 04/20/2024  Shoulder extension 48 51  Shoulder flexion 127(pulls chest) 146  Shoulder abduction 110, pain pulling chest 146  Shoulder internal rotation    Shoulder external rotation      (Blank rows = not tested)  A/PROM LEFT   eval LEFT 04/20/2024  Shoulder extension 40, tight 46  Shoulder flexion 65 (tight/pain chest) 102, chest pain  Shoulder abduction 70 ( tight chest) 115  chest pain  Shoulder internal rotation    Shoulder external rotation      (Blank rows = not tested)  CERVICAL AROM: All within functional limits: Limited 40-50% with Bilateral rotation and SB  UPPER EXTREMITY STRENGTH:   LYMPHEDEMA ASSESSMENTS:   SURGERY TYPE/DATE: Multiple surgeries 2006, 2013 2022, 2024 B.Mastectomies with reconstruction, Implants removed 2023 due to non healing wound,  NUMBER OF LYMPH NODES REMOVED: 6 right  CHEMOTHERAPY:  YES, ongoing  RADIATION:YEs  HORMONE TREATMENT: Yes, Anastrozole   INFECTIONS: non healing wound requiring removal of implants    LYMPHEDEMA ASSESSMENTS:   LANDMARK RIGHT  eval  At axilla    15 cm proximal to olecranon process   10 cm proximal to olecranon process   Olecranon process   15 cm proximal to ulnar styloid process   10 cm proximal to ulnar styloid process   Just proximal to ulnar styloid process   Across hand at thumb web space   At base of 2nd digit   (Blank rows = not tested)  LANDMARK LEFT  eval  At axilla    15 cm proximal to olecranon process   10 cm proximal to olecranon process   Olecranon process   15 cm proximal to ulnar styloid process   10 cm proximal to ulnar styloid process   Just proximal to ulnar styloid process   Across hand at thumb web space   At base of 2nd digit   (Blank rows = not tested)   FUNCTIONAL TESTS:    GAIT: WNL   QUICK DASH SURVEY: 36.36  TREATMENT DATE:  05/01/2024 STM to bilateral UT, Right pectorals and bilateral lateral trunk with cocoa butter, scar massage left mastectomy incision PROM bilateral shoulder flex, scaption, abd, IR and ER with VC's prn to relax mainly on left Supine wand flexion and scaption x 3, 5 sec hold with exhale at the end Star gazer x 5, 5 sec hold Single arm pec stretch at doorway x 3 ea, 20 sec scapular retraction, shoulder extension, and bilateral ER x 10 with yellow band  04/27/2024 Instructed in standing postural exercises;scapular retraction, shoulder extension, and bilateral ER x 10 with yellow band Single arm pec stretch at doorway x 3 ea, 20 sec Supine wand flexion and scaption x 3, 5 sec hold with exhale at the end Star gazer x 5, 5 sec hold STM to bilateral UT, Right pectorals and bilateral lateral trunk with cocoa butter, scar massage left mastectomy  incision PROM bilateral shoulder flex, scaption, abd, IR and ER with VC's prn to relax mainly on left Updated HEP with band exs and advised to do 3x/week and not to the exclusion of her stretches  04/20/2024 Supine wand flexion and scaption x 5, 5 sec hold with exhale at the end Star gazer x 5 Snow angels x 5 B, 5 sec hold STM to bilateral UT, Right pectorals and bilateral lateral trunk with cocoa butter, scar massage left mastectomy incision PROM bilateral shoulder flex, scaption, abd, IR and ER with VC's prn to relax 3 D AROM Flexion, scaption, horizontal abd x 3 Measured AROM bilateral shoulders and checked STG's for pt. Progress.  04/16/2024 Supine wand flexion and scaption x 5, 5 sec hold with exhale at the end Star gazer x 5 Snow angels x 5 B, 5 sec hold 3D bilateral flexion, scaption, horizontal abd x 4 MFR to bilateral chest regions avoiding pacemaker on left,  STM to bilateral UT, Right pectorals and bilateral lateral trunk with cocoa butter, scar massage left mastectomy incision  04/13/2024 Supine wand flexion and scaption x 5, 5 sec hold with exhale at the end Star gazer x 5 Snow angels x 5 B, 5 sec hold MFR to bilateral chest regions avoiding pacemaker on left,  STM to bilateral UT, Right pectorals and bilateral lateral trunk with cocoa butter, scar massage left mastectomy incision PROM bilateral shoulder flex, scaption, abd, IR and ER with VC's prn to relax Showed SB stretch to stretch lateral trunk  04/08/2024 B Scapular retraction x 5 Bilateral shoulder abd x 4 with wall slides Supine wand flexion and scaption x 3, 5 sec hold with exhale at the end [facility] angels x 5 B, 5 sec hold MFR to bilateral chest regions avoiding pacemaker on left PROM bilateral shoulder flex, scaption, abd, IR and ER with VC's prn to relax 04/02/24: Therapeutic Exercises Pulleys into flex x 2 mins and abd x 3 mins with pt returning therapist demo and VC's during to relax shoulders. Pt reported  starting to feel increased discomfort at pacemaker site Manual Therapy P/ROM in supine to bil shoulders into flex, abd and D2, carefully on Lt side due to pacemaker, scapular depression by therapist throughout STM over areas of mastectomy and Rt pacemaker scar tissue but avoided area of current pace maker placement on Lt  03/30/2024 Discussed POC, LOS, treatment interventions.. Instructed in supine wand exs for flexion and scaption x 5 ea and supine stargazer x 5. Updated HEP with illustrated and written instructions, also showed scapular retraction, but forgot to give pic. Discussed new charging rule for DN.  PATIENT EDUCATION:  Education details: supine wand exs for flexion and scaption x 5 ea and supine stargazer. Person educated: Patient Education method: Explanation, Demonstration, and Handouts Education comprehension: verbalized understanding and returned demonstration  HOME EXERCISE PROGRAM: supine wand exs for flexion and scaption x 5 ea and supine stargazer x 5 to perform 2x/day Standing postural exs with band, SR, ext , ER with yelow  ASSESSMENT:  CLINICAL IMPRESSION: Pt did well with all aspects of her POC.  No sig changes made.   OBJECTIVE IMPAIRMENTS: decreased activity tolerance, decreased knowledge of condition, decreased ROM, decreased strength, increased fascial restrictions, impaired flexibility, impaired UE functional use, postural dysfunction, and pain.   ACTIVITY LIMITATIONS: carrying, sleeping, dressing, reach over head, and hygiene/grooming  PARTICIPATION LIMITATIONS: cleaning, laundry, yard work, and activities requiring significant use of arms  PERSONAL FACTORS: 3+ comorbidities: Multiple breast surgeries, chemo, radiation, pacemaker swithced right to left are also affecting patient's functional outcome.   REHAB POTENTIAL: Good  CLINICAL DECISION MAKING: Evolving/moderate complexity  EVALUATION COMPLEXITY: Moderate  GOALS: Goals reviewed with patient?  Yes  SHORT TERM GOALS: Target date: 04/20/2024  Pt will be independent in a HEP for bilateral shoulder ROM/strength Baseline: Goal status: MET 04/20/2024  2.  Pt will have decreased chest/arm pain by 25% Baseline:  Goal status: MET 6/30 Left arm, Right chest, In progress for Left chest 3.  Pt will improve Shoulder AROM for Left flexion and  B abd  by 25-30 degrees Baseline:  Goal status: MET 04/20/2024  LONG TERM GOALS: Target date: 05/11/2024  Pt will have bilateral shoulder ROM for flexion and scaption to 125 degrees for improved reaching abiliy Baseline:  Goal status: INITIAL  2.  Pt will report decreased chest and left UE pain by 50% or greater Baseline:  Goal status: INITIAL  3.  Quick dash will improve to no greater than 20% to demonstrate improved function Baseline:  Goal status: INITIAL  4.  Pt will have improved sleep by 50% or more to be more rested during the day Baseline:  Goal status: INITIAL   PLAN:  PT FREQUENCY: 2x/week  PT DURATION: 6 weeks  PLANNED INTERVENTIONS: 97164- PT Re-evaluation, 97750- Physical Performance Testing, 97110-Therapeutic exercises, 97530- Therapeutic activity, [id number]- Neuromuscular re-education, 97535- Self Care, [id number]- Manual therapy, 20560 (1-2 muscles), 20561 (3+ muscles)- Dry Needling, and Patient/Family education  PLAN FOR NEXT SESSION: Review bnad, STM bilateral UT, axillary border of pecs, sternal border as appropriate,Careful on left due to Pacemaker. MFR to bilateral chest areas, Mastectomy scar massage,cupping,PROM, update HEP, Progress to band exercises.  Larue Saddie SAUNDERS, PT 05/01/2024, 12:01 PM

## 2024-05-12 ENCOUNTER — Ambulatory Visit

## 2024-05-12 DIAGNOSIS — I428 Other cardiomyopathies: Secondary | ICD-10-CM | POA: Diagnosis not present

## 2024-05-13 LAB — CUP PACEART REMOTE DEVICE CHECK
Battery Remaining Longevity: 64 mo
Battery Remaining Percentage: 73 %
Battery Voltage: 2.98 V
Brady Statistic AP VP Percent: 2.7 %
Brady Statistic AP VS Percent: 1 %
Brady Statistic AS VP Percent: 97 %
Brady Statistic AS VS Percent: 1 %
Brady Statistic RA Percent Paced: 2.6 %
Date Time Interrogation Session: 20250722020017
HighPow Impedance: 65 Ohm
HighPow Impedance: 65 Ohm
Implantable Lead Connection Status: 753985
Implantable Lead Connection Status: 753985
Implantable Lead Connection Status: 753985
Implantable Lead Implant Date: 20250116
Implantable Lead Implant Date: 20250116
Implantable Lead Implant Date: 20250116
Implantable Lead Location: 753859
Implantable Lead Location: 753860
Implantable Lead Location: 753860
Implantable Pulse Generator Implant Date: 20231103
Lead Channel Impedance Value: 230 Ohm
Lead Channel Impedance Value: 430 Ohm
Lead Channel Impedance Value: 510 Ohm
Lead Channel Pacing Threshold Amplitude: 0.25 V
Lead Channel Pacing Threshold Amplitude: 0.5 V
Lead Channel Pacing Threshold Amplitude: 0.75 V
Lead Channel Pacing Threshold Pulse Width: 0.05 ms
Lead Channel Pacing Threshold Pulse Width: 0.5 ms
Lead Channel Pacing Threshold Pulse Width: 0.5 ms
Lead Channel Sensing Intrinsic Amplitude: 11.9 mV
Lead Channel Sensing Intrinsic Amplitude: 3 mV
Lead Channel Setting Pacing Amplitude: 0.25 V
Lead Channel Setting Pacing Amplitude: 2 V
Lead Channel Setting Pacing Amplitude: 2 V
Lead Channel Setting Pacing Pulse Width: 0.05 ms
Lead Channel Setting Pacing Pulse Width: 0.5 ms
Lead Channel Setting Sensing Sensitivity: 0.5 mV
Pulse Gen Serial Number: 5548670
Zone Setting Status: 755011

## 2024-05-17 ENCOUNTER — Ambulatory Visit: Payer: Self-pay | Admitting: Internal Medicine

## 2024-05-20 ENCOUNTER — Ambulatory Visit

## 2024-05-20 DIAGNOSIS — M25512 Pain in left shoulder: Secondary | ICD-10-CM | POA: Diagnosis not present

## 2024-05-20 DIAGNOSIS — Z483 Aftercare following surgery for neoplasm: Secondary | ICD-10-CM

## 2024-05-20 DIAGNOSIS — R0789 Other chest pain: Secondary | ICD-10-CM

## 2024-05-20 DIAGNOSIS — C50412 Malignant neoplasm of upper-outer quadrant of left female breast: Secondary | ICD-10-CM | POA: Diagnosis not present

## 2024-05-20 DIAGNOSIS — Z9013 Acquired absence of bilateral breasts and nipples: Secondary | ICD-10-CM

## 2024-05-20 DIAGNOSIS — R293 Abnormal posture: Secondary | ICD-10-CM

## 2024-05-20 DIAGNOSIS — Z171 Estrogen receptor negative status [ER-]: Secondary | ICD-10-CM | POA: Diagnosis not present

## 2024-05-20 NOTE — Therapy (Signed)
 OUTPATIENT PHYSICAL THERAPY  UPPER EXTREMITY ONCOLOGY TREATMENT  Patient Name: Jordan Willis MRN: [id number] DOB:[birth year], 73 y.o., female Today's Date: 05/20/2024  END OF SESSION:  PT End of Session - 05/20/24 1107     Visit Number 9    Number of Visits 15    Date for PT Re-Evaluation 07/01/24    Authorization Type none needed    PT Start Time 1107    PT Stop Time 1155    PT Time Calculation (min) 48 min    Activity Tolerance Patient tolerated treatment well    Behavior During Therapy WFL for tasks assessed/performed           Past Medical History:  Diagnosis Date   AICD (automatic cardioverter/defibrillator) present    Anemia    Anginal pain (HCC)    Anxiety    Arthritis    Lumbar spine DDD.   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   Diabetes mellitus without complication (HCC)    Type II   Dyspnea    When patient gets sick uses Pro-Air inhaler   Frequent sinus infections    GERD (gastroesophageal reflux disease)    occ   Goiter    History of cardiomegaly    History of cataract    Bilateral   History of kidney stones    08/28/2018 currently has a large kidney stone, asymptomatic at this time   History of migraine    History of uterine fibroid    History of uterine prolapse    Hyperlipidemia    Hypertension    LBBB (left bundle branch block)    Menopause 03/03/2012   Nasal turbinate hypertrophy 11/29/2016   Left inferior    Personal history of chemotherapy    Personal history of radiation therapy    Pneumonia    as a child   PONV (postoperative nausea and vomiting)    Sinusitis 2014   Sleep apnea    No CPAP   SUI (stress urinary incontinence, female)    SVD (spontaneous vaginal delivery)    x 2   Thyroid  nodule    Past Surgical History:  Procedure Laterality Date    ABDOMINAL HYSTERECTOMY     BILATERAL SALPINGOOPHORECTOMY Bilateral    BIV ICD INSERTION CRT-D N/A 08/24/2022   Procedure: BIV ICD INSERTION CRT-D;  Surgeon: Waddell Danelle ORN, MD;  Location: [facility] INVASIVE CV LAB;  Service: Cardiovascular;  Laterality: N/A;   BIV ICD INSERTION CRT-D N/A 11/07/2023   Procedure: BIV ICD INSERTION CRT-D;  Surgeon: Waddell Danelle ORN, MD;  Location: [facility] INVASIVE CV LAB;  Service: Cardiovascular;  Laterality: N/A;   BLADDER SUSPENSION N/A 05/16/2017   Procedure: TRANSVAGINAL TAPE (TVT) PROCEDURE;  Surgeon: Henry Slough, MD;  Location: [facility] ORS;  Service: Gynecology;  Laterality: N/A;   BREAST BIOPSY Right 2009   BREAST IMPLANT REMOVAL Bilateral 02/05/2022   Procedure: Removal bilateral breast implants;  Surgeon: Elisabeth Craig RAMAN, MD;  Location: [facility] OR;  Service: Plastics;  Laterality: Bilateral;   BREAST LUMPECTOMY Right 2009   BREAST LUMPECTOMY Left 2006   BREAST RECONSTRUCTION WITH PLACEMENT OF TISSUE EXPANDER AND FLEX HD (ACELLULAR HYDRATED DERMIS) Bilateral 09/04/2021   Procedure: BILATERAL BREAST RECONSTRUCTION WITH PLACEMENT OF TISSUE EXPANDER AND FLEX HD (ACELLULAR HYDRATED DERMIS);  Surgeon: Elisabeth Craig RAMAN, MD;  Location: [facility] OR;  Service: Plastics;  Laterality: Bilateral;   CARDIAC CATHETERIZATION     CATARACT EXTRACTION Left 10/23/2017   CATARACT EXTRACTION Right 11/06/2017   COLONOSCOPY  10/22/2009   Normal.  Repeat 5 years.  [name]   COLONOSCOPY WITH PROPOFOL  N/A 12/21/2022   Procedure: COLONOSCOPY WITH PROPOFOL ;  Surgeon: Rollin Dover, MD;  Location: [facility];  Service: Gastroenterology;  Laterality: N/A;   CYSTOSCOPY N/A 05/16/2017   Procedure: CYSTOSCOPY;  Surgeon: Henry Slough, MD;  Location: [facility];  Service: Gynecology;  Laterality: N/A;   DEBRIDEMENT AND CLOSURE WOUND Bilateral 10/03/2021   Procedure: Debridement bilateral mastectomy flaps;  Surgeon: Elisabeth Craig RAMAN, MD;  Location: [facility] OR;  Service: Plastics;  Laterality: Bilateral;  1.5 hour    EYE SURGERY     bilateral cataracts   LEAD EXTRACTION N/A 11/07/2023   Procedure: LEAD EXTRACTION;  Surgeon: Waddell Danelle ORN, MD;  Location: [facility] INVASIVE CV LAB;  Service: Cardiovascular;  Laterality: N/A;   LEFT HEART CATH AND CORONARY ANGIOGRAPHY N/A 02/09/2020   Procedure: LEFT HEART CATH AND CORONARY ANGIOGRAPHY;  Surgeon: Elmira Newman PARAS, MD;  Location: [facility] INVASIVE CV LAB;  Service: Cardiovascular;  Laterality: N/A;   LEFT HEART CATH AND CORONARY ANGIOGRAPHY N/A 03/27/2022   Procedure: LEFT HEART CATH AND CORONARY ANGIOGRAPHY;  Surgeon: Ladona Heinz, MD;  Location: [facility] INVASIVE CV LAB;  Service: Cardiovascular;  Laterality: N/A;   MASS EXCISION Right 12/04/2022   Procedure: EXCISION OF RIGHT CHEST WALL MASS;  Surgeon: Aron Shoulders, MD;  Location: [facility] OR;  Service: General;  Laterality: Right;   MASTECTOMY W/ SENTINEL NODE BIOPSY Right 09/04/2021   Procedure: BILATERAL MASTECTOMIES WITH RIGHT SENTINEL LYMPH NODE BIOPSY;  Surgeon: Aron Shoulders, MD;  Location: [facility] OR;  Service: General;  Laterality: Right;   REMOVAL OF BILATERAL TISSUE EXPANDERS WITH PLACEMENT OF BILATERAL BREAST IMPLANTS Bilateral 10/03/2021   Procedure: REMOVAL OF BILATERAL TISSUE EXPANDERS WITH REPLACEMENT OF TISSUE EXPANDERS;  Surgeon: Elisabeth Craig RAMAN, MD;  Location: [facility] OR;  Service: Plastics;  Laterality: Bilateral;   REMOVAL OF BILATERAL TISSUE EXPANDERS WITH PLACEMENT OF BILATERAL BREAST IMPLANTS Bilateral 12/19/2021   Procedure: REMOVAL OF BILATERAL TISSUE EXPANDERS WITH PLACEMENT OF BILATERAL BREAST IMPLANTS;  Surgeon: Elisabeth Craig RAMAN, MD;  Location: [facility] OR;  Service: Plastics;  Laterality: Bilateral;   ROBOTIC ASSISTED TOTAL HYSTERECTOMY N/A 05/16/2017   Procedure: ROBOTIC ASSISTED TOTAL HYSTERECTOMY;  Surgeon: Darcel Pool, MD;  Location: [facility];  Service: Gynecology;  Laterality: N/A;   ROTATOR CUFF REPAIR Bilateral    SCAR REVISION N/A 12/04/2022   Procedure: REVISION OF MASTECTOMY SCAR;  Surgeon: Aron Shoulders, MD;   Location: [facility] OR;  Service: General;  Laterality: N/A;   SHOULDER ARTHROSCOPY WITH ROTATOR CUFF REPAIR AND SUBACROMIAL DECOMPRESSION Right 09/03/2018   Procedure: Right shoulder manipulation under anesthesia, exam under anesthesia, mini open rotator cuff repair, subacromial decompression;  Surgeon: Duwayne Purchase, MD;  Location: [facility];  Service: Orthopedics;  Laterality: Right;  90 mins   TRANSESOPHAGEAL ECHOCARDIOGRAM (CATH LAB) N/A 11/07/2023   Procedure: TRANSESOPHAGEAL ECHOCARDIOGRAM;  Surgeon: Waddell Danelle ORN, MD;  Location: [facility] INVASIVE CV LAB;  Service: Cardiovascular;  Laterality: N/A;   TUBAL LIGATION     WISDOM TOOTH EXTRACTION     Patient Active Problem List   Diagnosis Date Noted   Chronic systolic heart failure (HCC) 11/07/2023   ICD (implantable cardioverter-defibrillator) in place 12/25/2022   Bradycardia 08/24/2022   BRCA2 gene mutation positive in female 07/30/2022   LBBB (left bundle branch  block)    Chronic diastolic heart failure (HCC)    NICM (nonischemic cardiomyopathy) (HCC)    Breast cancer of upper-outer quadrant of right female breast (HCC) 09/04/2021   Diabetic neuropathy (HCC) 02/08/2021   Angina pectoris (HCC) 02/09/2020   Abnormal stress test 02/09/2020   Complete rotator cuff tear 09/03/2018   Pelvic prolapse 05/16/2017   Intractable chronic post-traumatic headache 11/19/2016   Malignant neoplasm of upper-outer quadrant of left breast in female, estrogen receptor negative (HCC) 03/03/2012   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   Menopause 03/03/2012    PCP: Ryan Hives, MD  REFERRING PROVIDER: Mackey Chad, MD  REFERRING DIAG: s/p breast Cancer/ shoulder pain  THERAPY DIAG:  History of bilateral mastectomy  Malignant neoplasm of upper-outer quadrant of left breast in female, estrogen receptor negative (HCC)  Left shoulder pain, unspecified chronicity  Abnormal posture  Muscular chest pain  Aftercare following surgery  for neoplasm  ONSET DATE: 11/07/2023  Rationale for Evaluation and Treatment: Rehabilitation  SUBJECTIVE:                                                                                                                                                                                           SUBJECTIVE STATEMENT:   I can reach better to my cabinets. I can stretch better overall. I can lay on my left side for a little while and can turn without waking up. Sleeping some better. I get up 6 times a night because they have me on 4 different fluid pills. My left chest is still tight and painful with intermittent shooting pains, but is better overall. My sister was in town for 2 weeks so I wasn't able to come.  PERTINENT HISTORY:   Pt was diagnosed with right breast cancer dx 05/2021. She has BRCA2 genetic mutation and has had left breast cancer in 2006 with chemo/breast conservation and XRT. She then had right breast cancer (dcis)  in 2009 with lumpectomy and radiation only. In 2022 she presented  with a palpable painful mass in the upper outer right breast. Dx imaging showed a 1.2 cm mass at 10 o'clock. 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  Pt underwent bilateral mastectomies with right sentinel node biopsy and expander reconstruction (Pace) 09/04/21. She had 6 negative nodes and negative margins. Patient ended up having the expanders removed 01/26/2022 because of continued drainage and discomfort.  2.14.2024 : revision of left mastectomy scar and removal of mass on right lateral mastectomy scar. Core needle biopsy was performed and this demonstrated invasive ductal carcinoma, grade 3. This was weakly ER positive,  PR negative, HER2 positive, Ki-67 70%. She had 11 cycles of Breast Maintenane Trastuzumab    Pt also has a Investment banker, corporate.  She has a decrease ejection fraction of 20- 30% .   PAIN:  Are you having pain? Yes NPRS scale: 5/10 present on left and 3/10 on  right Pain location: bilateral chest greater on left and left upper arm Pain orientation: Bilateral  PAIN TYPE: aching, dull, sharp, and tight Pain description: constant on the left chest, but can be sharp,  Aggravating factors: sleeping on left, reaching Relieving factors: massaging, topical, ibuprofen   PRECAUTIONS: at risk for lymphedema , pacemaker, defibrillator, DM, Right RTC repair 2019, cardiomyopathy, LV EF 20-25%  RED FLAGS: None   WEIGHT BEARING RESTRICTIONS: No  FALLS:  Has patient fallen in last 6 months? No  LIVING ENVIRONMENT: Lives with: pts mom lives with her and her daughter temporarily Lives in: House/apartment  OCCUPATION: does not work  LEISURE: walks 3 days /week, can walk up to 2 miles with breaks  HAND DOMINANCE: right   PRIOR LEVEL OF FUNCTION: Independent  PATIENT GOALS: Decrease pain, improve ROM   OBJECTIVE: Note: Objective measures were completed at Evaluation unless otherwise noted.  COGNITION: Overall cognitive status: Within functional limits for tasks assessed   PALPATION: Tender bilateral UT, pectorals, incision areas from pacemaker B  OBSERVATIONS / OTHER ASSESSMENTS: Bilateral mastectomy incisions healed but very restricted, keloid scar right chest from pacemaker removal, and new keloid scar on left chest from new pacemaker.defibrillator placement.  SENSATION: Light touch: Deficits     POSTURE: forward head rounded shoulders   UPPER EXTREMITY AROM/PROM:  A/PROM RIGHT   eval  RIGHT 04/20/2024 RIGHT 05/20/2024  Shoulder extension 48 51 53  Shoulder flexion 127(pulls chest) 146 146 tight   Shoulder abduction 110, pain pulling chest 146 150  Shoulder internal rotation     Shoulder external rotation       (Blank rows = not tested)  A/PROM LEFT   eval LEFT 04/20/2024 LEFT 05/20/2024  Shoulder extension 40, tight 46 46  Shoulder flexion 65 (tight/pain chest) 102, chest pain 140 tight, ant shoulder pain  Shoulder abduction 70  ( tight chest) 115  chest pain 130  Shoulder internal rotation     Shoulder external rotation       (Blank rows = not tested)  CERVICAL AROM: All within functional limits: Limited 40-50% with Bilateral rotation and SB  UPPER EXTREMITY STRENGTH:   LYMPHEDEMA ASSESSMENTS:   SURGERY TYPE/DATE: Multiple surgeries 2006, 2013 2022, 2024 B.Mastectomies with reconstruction, Implants removed 2023 due to non healing wound,  NUMBER OF LYMPH NODES REMOVED: 6 right  CHEMOTHERAPY: YES, ongoing  RADIATION:YEs  HORMONE TREATMENT: Yes, Anastrozole   INFECTIONS: non healing wound requiring removal of implants    LYMPHEDEMA ASSESSMENTS:   LANDMARK RIGHT  eval  At axilla    15 cm proximal to olecranon process   10 cm proximal to olecranon process   Olecranon process   15 cm proximal to ulnar styloid process   10 cm proximal to ulnar styloid process   Just proximal to ulnar styloid process   Across hand at thumb web space   At base of 2nd digit   (Blank rows = not tested)  LANDMARK LEFT  eval  At axilla    15 cm proximal to olecranon process   10 cm proximal to olecranon process   Olecranon process   15 cm proximal to ulnar styloid process   10 cm proximal to ulnar styloid process  Just proximal to ulnar styloid process   Across hand at thumb web space   At base of 2nd digit   (Blank rows = not tested)   FUNCTIONAL TESTS:    GAIT: WNL   QUICK DASH SURVEY: 36.36                                                                                                                            TREATMENT DATE:   05/20/2024 Discussed improvements and deficits for PN Supine wand flexion and scaption x 4 ea PROM bilateral shoulder flexion scaption, abduction, IR and ER 3 D bilateral AROM flexion, scaption horizontal abd x 5 Snow angels x 5 B   05/01/2024 STM to bilateral UT, Right pectorals and bilateral lateral trunk with cocoa butter, scar massage left mastectomy incision PROM  bilateral shoulder flex, scaption, abd, IR and ER with VC's prn to relax mainly on left Supine wand flexion and scaption x 3, 5 sec hold with exhale at the end Star gazer x 5, 5 sec hold Single arm pec stretch at doorway x 3 ea, 20 sec scapular retraction, shoulder extension, and bilateral ER x 10 with yellow band  04/27/2024 Instructed in standing postural exercises;scapular retraction, shoulder extension, and bilateral ER x 10 with yellow band Single arm pec stretch at doorway x 3 ea, 20 sec Supine wand flexion and scaption x 3, 5 sec hold with exhale at the end Star gazer x 5, 5 sec hold STM to bilateral UT, Right pectorals and bilateral lateral trunk with cocoa butter, scar massage left mastectomy incision PROM bilateral shoulder flex, scaption, abd, IR and ER with VC's prn to relax mainly on left Updated HEP with band exs and advised to do 3x/week and not to the exclusion of her stretches  04/20/2024 Supine wand flexion and scaption x 5, 5 sec hold with exhale at the end Star gazer x 5 Snow angels x 5 B, 5 sec hold STM to bilateral UT, Right pectorals and bilateral lateral trunk with cocoa butter, scar massage left mastectomy incision PROM bilateral shoulder flex, scaption, abd, IR and ER with VC's prn to relax 3 D AROM Flexion, scaption, horizontal abd x 3 Measured AROM bilateral shoulders and checked STG's for pt. Progress.  04/16/2024 Supine wand flexion and scaption x 5, 5 sec hold with exhale at the end Star gazer x 5 Snow angels x 5 B, 5 sec hold 3D bilateral flexion, scaption, horizontal abd x 4 MFR to bilateral chest regions avoiding pacemaker on left,  STM to bilateral UT, Right pectorals and bilateral lateral trunk with cocoa butter, scar massage left mastectomy incision  04/13/2024 Supine wand flexion and scaption x 5, 5 sec hold with exhale at the end Star gazer x 5 Snow angels x 5 B, 5 sec hold MFR to bilateral chest regions avoiding pacemaker on left,  STM to  bilateral UT, Right pectorals and bilateral lateral trunk with cocoa butter, scar massage left mastectomy incision  PROM bilateral shoulder flex, scaption, abd, IR and ER with VC's prn to relax Showed SB stretch to stretch lateral trunk  04/08/2024 B Scapular retraction x 5 Bilateral shoulder abd x 4 with wall slides Supine wand flexion and scaption x 3, 5 sec hold with exhale at the end [facility] angels x 5 B, 5 sec hold MFR to bilateral chest regions avoiding pacemaker on left PROM bilateral shoulder flex, scaption, abd, IR and ER with VC's prn to relax 04/02/24: Therapeutic Exercises Pulleys into flex x 2 mins and abd x 3 mins with pt returning therapist demo and VC's during to relax shoulders. Pt reported starting to feel increased discomfort at pacemaker site Manual Therapy P/ROM in supine to bil shoulders into flex, abd and D2, carefully on Lt side due to pacemaker, scapular depression by therapist throughout STM over areas of mastectomy and Rt pacemaker scar tissue but avoided area of current pace maker placement on Lt  03/30/2024 Discussed POC, LOS, treatment interventions.. Instructed in supine wand exs for flexion and scaption x 5 ea and supine stargazer x 5. Updated HEP with illustrated and written instructions, also showed scapular retraction, but forgot to give pic. Discussed new charging rule for DN.   PATIENT EDUCATION:  Education details: supine wand exs for flexion and scaption x 5 ea and supine stargazer. Person educated: Patient Education method: Explanation, Demonstration, and Handouts Education comprehension: verbalized understanding and returned demonstration  HOME EXERCISE PROGRAM: supine wand exs for flexion and scaption x 5 ea and supine stargazer x 5 to perform 2x/day Standing postural exs with band, SR, ext , ER with yelow  ASSESSMENT:  CLINICAL IMPRESSION:  Pt has achieved all STG's and 2/4 LTG's. Her chest pain is improved by 25%, but not yet by 50% and she  continues with limitations because of this. She has achieved her ROM goals and has had excellent decrease in left UE pain. She is improving with functional activities however, quick dash is still limited. She will benefit from continuing skilled therapy decreasing to 1x/week as she continues to work hard on her HEP.  OBJECTIVE IMPAIRMENTS: decreased activity tolerance, decreased knowledge of condition, decreased ROM, decreased strength, increased fascial restrictions, impaired flexibility, impaired UE functional use, postural dysfunction, and pain.   ACTIVITY LIMITATIONS: carrying, sleeping, dressing, reach over head, and hygiene/grooming  PARTICIPATION LIMITATIONS: cleaning, laundry, yard work, and activities requiring significant use of arms  PERSONAL FACTORS: 3+ comorbidities: Multiple breast surgeries, chemo, radiation, pacemaker swithced right to left are also affecting patient's functional outcome.   REHAB POTENTIAL: Good  CLINICAL DECISION MAKING: Evolving/moderate complexity  EVALUATION COMPLEXITY: Moderate  GOALS: Goals reviewed with patient? Yes  SHORT TERM GOALS: Target date: 04/20/2024  Pt will be independent in a HEP for bilateral shoulder ROM/strength Baseline: Goal status: MET 04/20/2024  2.  Pt will have decreased chest/arm pain by 25% Baseline:  Goal status: MET 6/30 Left arm, Right chest, MET  Left chest  05/20/2024 3.  Pt will improve Shoulder AROM for Left flexion and  B abd  by 25-30 degrees Baseline:  Goal status: MET 04/20/2024  LONG TERM GOALS: Target date: 05/11/2024  Pt will have bilateral shoulder ROM for flexion and scaption to 125 degrees for improved reaching abiliy Baseline:  Goal status: MET 05/20/2024 2.  Pt will report decreased chest and left UE pain by 50% or greater Baseline:  Goal status: MET for LEFT UE, In progress for left chest pain  3.  Quick dash will improve to no greater than  20% to demonstrate improved function Baseline:  Goal  status: In Progress 4.  Pt will have improved sleep by 50% or more to be more rested during the day Baseline:  Goal status: In Progress able to get slightly more comfortable , but takes multiple fluid pills   PLAN:  PT FREQUENCY: 1x/week  PT DURATION: 4-6 weeks  PLANNED INTERVENTIONS: 97164- PT Re-evaluation, 97750- Physical Performance Testing, 97110-Therapeutic exercises, 97530- Therapeutic activity, [id number]- Neuromuscular re-education, 97535- Self Care, [id number]- Manual therapy, 20560 (1-2 muscles), 20561 (3+ muscles)- Dry Needling, and Patient/Family education  PLAN FOR NEXT SESSION: Review bnad, STM bilateral UT, axillary border of pecs, sternal border as appropriate,Careful on left due to Pacemaker. MFR to bilateral chest areas, Mastectomy scar massage,cupping,PROM, update HEP, Progress to band exercises.  Grayce JINNY Sheldon, PT 05/20/2024, 1:13 PM

## 2024-05-26 ENCOUNTER — Ambulatory Visit: Payer: 59

## 2024-05-27 ENCOUNTER — Ambulatory Visit: Attending: Hematology and Oncology

## 2024-05-27 DIAGNOSIS — R293 Abnormal posture: Secondary | ICD-10-CM | POA: Insufficient documentation

## 2024-05-27 DIAGNOSIS — R0789 Other chest pain: Secondary | ICD-10-CM | POA: Insufficient documentation

## 2024-05-27 DIAGNOSIS — C50412 Malignant neoplasm of upper-outer quadrant of left female breast: Secondary | ICD-10-CM | POA: Diagnosis not present

## 2024-05-27 DIAGNOSIS — M25512 Pain in left shoulder: Secondary | ICD-10-CM | POA: Diagnosis not present

## 2024-05-27 DIAGNOSIS — Z171 Estrogen receptor negative status [ER-]: Secondary | ICD-10-CM | POA: Insufficient documentation

## 2024-05-27 DIAGNOSIS — Z9013 Acquired absence of bilateral breasts and nipples: Secondary | ICD-10-CM | POA: Diagnosis not present

## 2024-05-27 NOTE — Therapy (Signed)
 Progress Note Reporting Period 03/30/2024 to 05/27/2024  See note below for Objective Data and Assessment of Progress/Goals.     OUTPATIENT PHYSICAL THERAPY  UPPER EXTREMITY ONCOLOGY TREATMENT  Patient Name: Jordan Willis MRN: [id number] DOB:1951-07-16, 73 y.o., female Today's Date: 05/27/2024  END OF SESSION:  PT End of Session - 05/27/24 1203     Visit Number 10    Number of Visits 15    Date for PT Re-Evaluation 07/01/24    Authorization Type none needed    PT Start Time 1203    PT Stop Time 1258    PT Time Calculation (min) 55 min    Activity Tolerance Patient tolerated treatment well    Behavior During Therapy WFL for tasks assessed/performed           Past Medical History:  Diagnosis Date   AICD (automatic cardioverter/defibrillator) present    Anemia    Anginal pain (HCC)    Anxiety    Arthritis    Lumbar spine DDD.   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   Diabetes mellitus without complication (HCC)    Type II   Dyspnea    When patient gets sick uses Pro-Air inhaler   Frequent sinus infections    GERD (gastroesophageal reflux disease)    occ   Goiter    History of cardiomegaly    History of cataract    Bilateral   History of kidney stones    08/28/2018 currently has a large kidney stone, asymptomatic at this time   History of migraine    History of uterine fibroid    History of uterine prolapse    Hyperlipidemia    Hypertension    LBBB (left bundle branch block)    Menopause 03/03/2012   Nasal turbinate hypertrophy 11/29/2016   Left inferior    Personal history of chemotherapy    Personal history of radiation therapy    Pneumonia    as a child   PONV (postoperative nausea and vomiting)    Sinusitis 2014   Sleep apnea    No CPAP   SUI (stress urinary incontinence,  female)    SVD (spontaneous vaginal delivery)    x 2   Thyroid  nodule    Past Surgical History:  Procedure Laterality Date   ABDOMINAL HYSTERECTOMY     BILATERAL SALPINGOOPHORECTOMY Bilateral    BIV ICD INSERTION CRT-D N/A 08/24/2022   Procedure: BIV ICD INSERTION CRT-D;  Surgeon: Waddell Danelle ORN, MD;  Location: [facility] INVASIVE CV LAB;  Service: Cardiovascular;  Laterality: N/A;   BIV ICD INSERTION CRT-D N/A 11/07/2023   Procedure: BIV ICD INSERTION CRT-D;  Surgeon: Waddell Danelle ORN, MD;  Location: [facility] INVASIVE CV LAB;  Service: Cardiovascular;  Laterality: N/A;   BLADDER SUSPENSION N/A 05/16/2017   Procedure: TRANSVAGINAL TAPE (TVT) PROCEDURE;  Surgeon: Henry Slough, MD;  Location: [facility] ORS;  Service: Gynecology;  Laterality: N/A;   BREAST BIOPSY Right 2009   BREAST IMPLANT REMOVAL Bilateral 02/05/2022   Procedure: Removal bilateral breast implants;  Surgeon: Elisabeth Craig RAMAN, MD;  Location: [facility] OR;  Service: Plastics;  Laterality: Bilateral;   BREAST LUMPECTOMY Right 2009   BREAST LUMPECTOMY Left 2006   BREAST RECONSTRUCTION WITH PLACEMENT OF TISSUE EXPANDER AND FLEX HD (ACELLULAR HYDRATED DERMIS) Bilateral 09/04/2021  Procedure: BILATERAL BREAST RECONSTRUCTION WITH PLACEMENT OF TISSUE EXPANDER AND FLEX HD (ACELLULAR HYDRATED DERMIS);  Surgeon: Elisabeth Craig RAMAN, MD;  Location: [facility] OR;  Service: Plastics;  Laterality: Bilateral;   CARDIAC CATHETERIZATION     CATARACT EXTRACTION Left 10/23/2017   CATARACT EXTRACTION Right 11/06/2017   COLONOSCOPY  10/22/2009   Normal.  Repeat 5 years.  [name]   COLONOSCOPY WITH PROPOFOL  N/A 12/21/2022   Procedure: COLONOSCOPY WITH PROPOFOL ;  Surgeon: Rollin Dover, MD;  Location: [facility];  Service: Gastroenterology;  Laterality: N/A;   CYSTOSCOPY N/A 05/16/2017   Procedure: CYSTOSCOPY;  Surgeon: Henry Slough, MD;  Location: [facility] ORS;  Service: Gynecology;  Laterality: N/A;   DEBRIDEMENT AND CLOSURE WOUND Bilateral 10/03/2021   Procedure: Debridement  bilateral mastectomy flaps;  Surgeon: Elisabeth Craig RAMAN, MD;  Location: [facility] OR;  Service: Plastics;  Laterality: Bilateral;  1.5 hour   EYE SURGERY     bilateral cataracts   LEAD EXTRACTION N/A 11/07/2023   Procedure: LEAD EXTRACTION;  Surgeon: Waddell Danelle ORN, MD;  Location: [facility] INVASIVE CV LAB;  Service: Cardiovascular;  Laterality: N/A;   LEFT HEART CATH AND CORONARY ANGIOGRAPHY N/A 02/09/2020   Procedure: LEFT HEART CATH AND CORONARY ANGIOGRAPHY;  Surgeon: Elmira Newman PARAS, MD;  Location: [facility] INVASIVE CV LAB;  Service: Cardiovascular;  Laterality: N/A;   LEFT HEART CATH AND CORONARY ANGIOGRAPHY N/A 03/27/2022   Procedure: LEFT HEART CATH AND CORONARY ANGIOGRAPHY;  Surgeon: Ladona Heinz, MD;  Location: [facility] INVASIVE CV LAB;  Service: Cardiovascular;  Laterality: N/A;   MASS EXCISION Right 12/04/2022   Procedure: EXCISION OF RIGHT CHEST WALL MASS;  Surgeon: Aron Shoulders, MD;  Location: [facility] OR;  Service: General;  Laterality: Right;   MASTECTOMY W/ SENTINEL NODE BIOPSY Right 09/04/2021   Procedure: BILATERAL MASTECTOMIES WITH RIGHT SENTINEL LYMPH NODE BIOPSY;  Surgeon: Aron Shoulders, MD;  Location: [facility] OR;  Service: General;  Laterality: Right;   REMOVAL OF BILATERAL TISSUE EXPANDERS WITH PLACEMENT OF BILATERAL BREAST IMPLANTS Bilateral 10/03/2021   Procedure: REMOVAL OF BILATERAL TISSUE EXPANDERS WITH REPLACEMENT OF TISSUE EXPANDERS;  Surgeon: Elisabeth Craig RAMAN, MD;  Location: [facility] OR;  Service: Plastics;  Laterality: Bilateral;   REMOVAL OF BILATERAL TISSUE EXPANDERS WITH PLACEMENT OF BILATERAL BREAST IMPLANTS Bilateral 12/19/2021   Procedure: REMOVAL OF BILATERAL TISSUE EXPANDERS WITH PLACEMENT OF BILATERAL BREAST IMPLANTS;  Surgeon: Elisabeth Craig RAMAN, MD;  Location: [facility] OR;  Service: Plastics;  Laterality: Bilateral;   ROBOTIC ASSISTED TOTAL HYSTERECTOMY N/A 05/16/2017   Procedure: ROBOTIC ASSISTED TOTAL HYSTERECTOMY;  Surgeon: Darcel Pool, MD;  Location: [facility] ORS;  Service: Gynecology;  Laterality: N/A;    ROTATOR CUFF REPAIR Bilateral    SCAR REVISION N/A 12/04/2022   Procedure: REVISION OF MASTECTOMY SCAR;  Surgeon: Aron Shoulders, MD;  Location: [facility] OR;  Service: General;  Laterality: N/A;   SHOULDER ARTHROSCOPY WITH ROTATOR CUFF REPAIR AND SUBACROMIAL DECOMPRESSION Right 09/03/2018   Procedure: Right shoulder manipulation under anesthesia, exam under anesthesia, mini open rotator cuff repair, subacromial decompression;  Surgeon: Duwayne Purchase, MD;  Location: [facility] ORS;  Service: Orthopedics;  Laterality: Right;  90 mins   TRANSESOPHAGEAL ECHOCARDIOGRAM (CATH LAB) N/A 11/07/2023   Procedure: TRANSESOPHAGEAL ECHOCARDIOGRAM;  Surgeon: Waddell Danelle ORN, MD;  Location: [facility] INVASIVE CV LAB;  Service: Cardiovascular;  Laterality: N/A;   TUBAL LIGATION     WISDOM TOOTH EXTRACTION     Patient Active Problem List   Diagnosis Date Noted   Chronic systolic heart failure (HCC) 11/07/2023   ICD (implantable cardioverter-defibrillator)  in place 12/25/2022   Bradycardia 08/24/2022   BRCA2 gene mutation positive in female 07/30/2022   LBBB (left bundle branch block)    Chronic diastolic heart failure (HCC)    NICM (nonischemic cardiomyopathy) (HCC)    Breast cancer of upper-outer quadrant of right female breast (HCC) 09/04/2021   Diabetic neuropathy (HCC) 02/08/2021   Angina pectoris (HCC) 02/09/2020   Abnormal stress test 02/09/2020   Complete rotator cuff tear 09/03/2018   Pelvic prolapse 05/16/2017   Intractable chronic post-traumatic headache 11/19/2016   Malignant neoplasm of upper-outer quadrant of left breast in female, estrogen receptor negative (HCC) 03/03/2012   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   Menopause 03/03/2012    PCP: Ryan Hives, MD  REFERRING PROVIDER: Mackey Chad, MD  REFERRING DIAG: s/p breast Cancer/ shoulder pain  THERAPY DIAG:  History of bilateral mastectomy  Malignant neoplasm of upper-outer quadrant of left breast in female, estrogen receptor  negative (HCC)  Left shoulder pain, unspecified chronicity  Abnormal posture  Muscular chest pain  ONSET DATE: 11/07/2023  Rationale for Evaluation and Treatment: Rehabilitation  SUBJECTIVE:                                                                                                                                                                                           SUBJECTIVE STATEMENT:  My mom is taking up more of my time. Still exercising but not as much. Shoulders are hurting some, especially the left shoulder. I think I layed on my left shoulder wrong.   PERTINENT HISTORY:   Pt was diagnosed with right breast cancer dx 05/2021. She has BRCA2 genetic mutation and has had left breast cancer in 2006 with chemo/breast conservation and XRT. She then had right breast cancer (dcis)  in 2009 with lumpectomy and radiation only. In 2022 she presented  with a palpable painful mass in the upper outer right breast. Dx imaging showed a 1.2 cm mass at 10 o'clock. 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  Pt underwent bilateral mastectomies with right sentinel node biopsy and expander reconstruction (Pace) 09/04/21. She had 6 negative nodes and negative margins. Patient ended up having the expanders removed 01/26/2022 because of continued drainage and discomfort.  2.14.2024 : revision of left mastectomy scar and removal of mass on right lateral mastectomy scar. Core needle biopsy was performed and this demonstrated invasive ductal carcinoma, grade 3. This was weakly ER positive, PR negative, HER2 positive, Ki-67 70%. She had 11 cycles of Breast Maintenane Trastuzumab    Pt also has a Investment banker, corporate.  She has a decrease ejection fraction of  20- 30% .   PAIN:  Are you having pain? Yes NPRS scale: 7/10 present on left shoulder and chest and 2-3/10 on right Pain location: bilateral chest greater on left and left upper arm Pain orientation: Bilateral  PAIN  TYPE: aching, dull, sharp, and tight Pain description: constant on the left chest, but can be sharp,  Aggravating factors: sleeping on left, reaching Relieving factors: massaging, topical, ibuprofen   PRECAUTIONS: at risk for lymphedema , pacemaker, defibrillator, DM, Right RTC repair 2019, cardiomyopathy, LV EF 20-25%  RED FLAGS: None   WEIGHT BEARING RESTRICTIONS: No  FALLS:  Has patient fallen in last 6 months? No  LIVING ENVIRONMENT: Lives with: pts mom lives with her and her daughter temporarily Lives in: House/apartment  OCCUPATION: does not work  LEISURE: walks 3 days /week, can walk up to 2 miles with breaks  HAND DOMINANCE: right   PRIOR LEVEL OF FUNCTION: Independent  PATIENT GOALS: Decrease pain, improve ROM   OBJECTIVE: Note: Objective measures were completed at Evaluation unless otherwise noted.  COGNITION: Overall cognitive status: Within functional limits for tasks assessed   PALPATION: Tender bilateral UT, pectorals, incision areas from pacemaker B  OBSERVATIONS / OTHER ASSESSMENTS: Bilateral mastectomy incisions healed but very restricted, keloid scar right chest from pacemaker removal, and new keloid scar on left chest from new pacemaker.defibrillator placement.  SENSATION: Light touch: Deficits     POSTURE: forward head rounded shoulders   UPPER EXTREMITY AROM/PROM:  A/PROM RIGHT   eval  RIGHT 04/20/2024 RIGHT 05/20/2024  Shoulder extension 48 51 53  Shoulder flexion 127(pulls chest) 146 146 tight   Shoulder abduction 110, pain pulling chest 146 150  Shoulder internal rotation     Shoulder external rotation       (Blank rows = not tested)  A/PROM LEFT   eval LEFT 04/20/2024 LEFT 05/20/2024  Shoulder extension 40, tight 46 46  Shoulder flexion 65 (tight/pain chest) 102, chest pain 140 tight, ant shoulder pain  Shoulder abduction 70 ( tight chest) 115  chest pain 130  Shoulder internal rotation     Shoulder external rotation        (Blank rows = not tested)  CERVICAL AROM: All within functional limits: Limited 40-50% with Bilateral rotation and SB  UPPER EXTREMITY STRENGTH:   LYMPHEDEMA ASSESSMENTS:   SURGERY TYPE/DATE: Multiple surgeries 2006, 2013 2022, 2024 B.Mastectomies with reconstruction, Implants removed 2023 due to non healing wound,  NUMBER OF LYMPH NODES REMOVED: 6 right  CHEMOTHERAPY: YES, ongoing  RADIATION:YEs  HORMONE TREATMENT: Yes, Anastrozole   INFECTIONS: non healing wound requiring removal of implants    LYMPHEDEMA ASSESSMENTS:   LANDMARK RIGHT  eval  At axilla    15 cm proximal to olecranon process   10 cm proximal to olecranon process   Olecranon process   15 cm proximal to ulnar styloid process   10 cm proximal to ulnar styloid process   Just proximal to ulnar styloid process   Across hand at thumb web space   At base of 2nd digit   (Blank rows = not tested)  LANDMARK LEFT  eval  At axilla    15 cm proximal to olecranon process   10 cm proximal to olecranon process   Olecranon process   15 cm proximal to ulnar styloid process   10 cm proximal to ulnar styloid process   Just proximal to ulnar styloid process   Across hand at thumb web space   At base of 2nd digit   (Blank rows =  not tested)   FUNCTIONAL TESTS:    GAIT: WNL   QUICK DASH SURVEY: 36.36                                                                                                                            TREATMENT DATE:   05/27/2024  Scapular retraction,  shoulder extension and ER all bilateral with yellow x 12 ea Supine wand flexion and scaption x 3 ea Supine snow angel x 5  05/20/2024 Discussed improvements and deficits for PN Supine wand flexion and scaption x 4 ea PROM bilateral shoulder flexion scaption, abduction, IR and ER 3 D bilateral AROM flexion, scaption horizontal abd x 5 Snow angels x 5 B STM to bilateral UT, Right pectorals and bilateral lateral trunk with cocoa  butter, scar massage left mastectomy incision PROM bilateral shoulder flex, scaption, abd, IR and ER with VC's prn to relax mainly on left  05/01/2024 STM to bilateral UT, Right pectorals and bilateral lateral trunk with cocoa butter, scar massage left mastectomy incision PROM bilateral shoulder flex, scaption, abd, IR and ER with VC's prn to relax mainly on left Supine wand flexion and scaption x 3, 5 sec hold with exhale at the end Star gazer x 5, 5 sec hold Single arm pec stretch at doorway x 3 ea, 20 sec scapular retraction, shoulder extension, and bilateral ER x 10 with yellow band  04/27/2024 Instructed in standing postural exercises;scapular retraction, shoulder extension, and bilateral ER x 10 with yellow band Single arm pec stretch at doorway x 3 ea, 20 sec Supine wand flexion and scaption x 3, 5 sec hold with exhale at the end Star gazer x 5, 5 sec hold STM to bilateral UT, Right pectorals and bilateral lateral trunk with cocoa butter, scar massage left mastectomy incision PROM bilateral shoulder flex, scaption, abd, IR and ER with VC's prn to relax mainly on left Updated HEP with band exs and advised to do 3x/week and not to the exclusion of her stretches  04/20/2024 Supine wand flexion and scaption x 5, 5 sec hold with exhale at the end Star gazer x 5 Snow angels x 5 B, 5 sec hold STM to bilateral UT, Right pectorals and bilateral lateral trunk with cocoa butter, scar massage left mastectomy incision PROM bilateral shoulder flex, scaption, abd, IR and ER with VC's prn to relax 3 D AROM Flexion, scaption, horizontal abd x 3 Measured AROM bilateral shoulders and checked STG's for pt. Progress.  04/16/2024 Supine wand flexion and scaption x 5, 5 sec hold with exhale at the end Star gazer x 5 Snow angels x 5 B, 5 sec hold 3D bilateral flexion, scaption, horizontal abd x 4 MFR to bilateral chest regions avoiding pacemaker on left,  STM to bilateral UT, Right pectorals and  bilateral lateral trunk with cocoa butter, scar massage left mastectomy incision  04/13/2024 Supine wand flexion and scaption x 5, 5 sec hold with exhale at the end Star gazer  x 5 Snow angels x 5 B, 5 sec hold MFR to bilateral chest regions avoiding pacemaker on left,  STM to bilateral UT, Right pectorals and bilateral lateral trunk with cocoa butter, scar massage left mastectomy incision PROM bilateral shoulder flex, scaption, abd, IR and ER with VC's prn to relax Showed SB stretch to stretch lateral trunk  04/08/2024 B Scapular retraction x 5 Bilateral shoulder abd x 4 with wall slides Supine wand flexion and scaption x 3, 5 sec hold with exhale at the end [facility] angels x 5 B, 5 sec hold MFR to bilateral chest regions avoiding pacemaker on left PROM bilateral shoulder flex, scaption, abd, IR and ER with VC's prn to relax 04/02/24: Therapeutic Exercises Pulleys into flex x 2 mins and abd x 3 mins with pt returning therapist demo and VC's during to relax shoulders. Pt reported starting to feel increased discomfort at pacemaker site Manual Therapy P/ROM in supine to bil shoulders into flex, abd and D2, carefully on Lt side due to pacemaker, scapular depression by therapist throughout STM over areas of mastectomy and Rt pacemaker scar tissue but avoided area of current pace maker placement on Lt  03/30/2024 Discussed POC, LOS, treatment interventions.. Instructed in supine wand exs for flexion and scaption x 5 ea and supine stargazer x 5. Updated HEP with illustrated and written instructions, also showed scapular retraction, but forgot to give pic. Discussed new charging rule for DN.   PATIENT EDUCATION:  Education details: supine wand exs for flexion and scaption x 5 ea and supine stargazer. Person educated: Patient Education method: Explanation, Demonstration, and Handouts Education comprehension: verbalized understanding and returned demonstration  HOME EXERCISE PROGRAM: supine wand exs  for flexion and scaption x 5 ea and supine stargazer x 5 to perform 2x/day Standing postural exs with band, SR, ext , ER with yelow  ASSESSMENT:  CLINICAL IMPRESSION:  Pt has achieved all STG's and 2/4 LTG's. Her chest pain is improved by 25%, but not yet by 50% and she continues with limitations because of this. She has achieved her ROM goals and has had excellent decrease in left UE pain.  She continues with left shoulder/chest pain possibly related to portacath. She is improving with functional activities however, quick dash is still limited.  OBJECTIVE IMPAIRMENTS: decreased activity tolerance, decreased knowledge of condition, decreased ROM, decreased strength, increased fascial restrictions, impaired flexibility, impaired UE functional use, postural dysfunction, and pain.   ACTIVITY LIMITATIONS: carrying, sleeping, dressing, reach over head, and hygiene/grooming  PARTICIPATION LIMITATIONS: cleaning, laundry, yard work, and activities requiring significant use of arms  PERSONAL FACTORS: 3+ comorbidities: Multiple breast surgeries, chemo, radiation, pacemaker swithced right to left are also affecting patient's functional outcome.   REHAB POTENTIAL: Good  CLINICAL DECISION MAKING: Evolving/moderate complexity  EVALUATION COMPLEXITY: Moderate  GOALS: Goals reviewed with patient? Yes  SHORT TERM GOALS: Target date: 04/20/2024  Pt will be independent in a HEP for bilateral shoulder ROM/strength Baseline: Goal status: MET 04/20/2024  2.  Pt will have decreased chest/arm pain by 25% Baseline:  Goal status: MET 6/30 Left arm, Right chest, MET  Left chest  05/20/2024 3.  Pt will improve Shoulder AROM for Left flexion and  B abd  by 25-30 degrees Baseline:  Goal status: MET 04/20/2024  LONG TERM GOALS: Target date: 05/11/2024  Pt will have bilateral shoulder ROM for flexion and scaption to 125 degrees for improved reaching abiliy Baseline:  Goal status: MET 05/20/2024 2.  Pt will  report decreased chest and left  UE pain by 50% or greater Baseline:  Goal status: MET for LEFT UE, In progress for left chest pain  3.  Quick dash will improve to no greater than 20% to demonstrate improved function Baseline:  Goal status: In Progress 4.  Pt will have improved sleep by 50% or more to be more rested during the day Baseline:  Goal status: In Progress able to get slightly more comfortable , but takes multiple fluid pills   PLAN:  PT FREQUENCY: 1x/week  PT DURATION: 4-6 weeks  PLANNED INTERVENTIONS: 97164- PT Re-evaluation, 97750- Physical Performance Testing, 97110-Therapeutic exercises, 97530- Therapeutic activity, [id number]- Neuromuscular re-education, 97535- Self Care, [id number]- Manual therapy, 20560 (1-2 muscles), 20561 (3+ muscles)- Dry Needling, and Patient/Family education  PLAN FOR NEXT SESSION: Review band,  Jobes exs,STM bilateral UT, axillary border of pecs, sternal border as appropriate,Careful on left due to Pacemaker. MFR to bilateral chest areas, Mastectomy scar massage,cupping,PROM, update HEP, Progress to band exercises.  Grayce JINNY Sheldon, PT 05/27/2024, 1:00 PM

## 2024-06-01 DIAGNOSIS — E118 Type 2 diabetes mellitus with unspecified complications: Secondary | ICD-10-CM | POA: Diagnosis not present

## 2024-06-01 DIAGNOSIS — C50911 Malignant neoplasm of unspecified site of right female breast: Secondary | ICD-10-CM | POA: Diagnosis not present

## 2024-06-01 DIAGNOSIS — C50912 Malignant neoplasm of unspecified site of left female breast: Secondary | ICD-10-CM | POA: Diagnosis not present

## 2024-06-01 DIAGNOSIS — E059 Thyrotoxicosis, 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: Secondary | ICD-10-CM | POA: Diagnosis not present

## 2024-06-01 LAB — COMPREHENSIVE METABOLIC PANEL WITH GFR
Albumin: 5.3 — AB (ref 3.5–5.0)
Calcium: 10.7 (ref 8.7–10.7)
Globulin: 2.8
eGFR: 64

## 2024-06-01 LAB — BASIC METABOLIC PANEL WITH GFR
BUN: 18 (ref 4–21)
CO2: 23 — AB (ref 13–22)
Chloride: 94 — AB (ref 99–108)
Creatinine: 0.9 (ref 0.5–1.1)
Glucose: 90
Potassium: 3.8 meq/L (ref 3.5–5.1)
Sodium: 137 (ref 137–147)

## 2024-06-01 LAB — HEMOGLOBIN A1C: Hemoglobin A1C: 6.7

## 2024-06-01 LAB — PROTEIN / CREATININE RATIO, URINE
Albumin, U: 5.2
Creatinine, Urine: 19.3

## 2024-06-01 LAB — MICROALBUMIN / CREATININE URINE RATIO: Microalb Creat Ratio: 19.3

## 2024-06-01 LAB — HEPATIC FUNCTION PANEL
ALT: 24 U/L (ref 7–35)
AST: 24 (ref 13–35)
Alkaline Phosphatase: 116 (ref 25–125)

## 2024-06-02 ENCOUNTER — Ambulatory Visit

## 2024-06-03 DIAGNOSIS — C50912 Malignant neoplasm of unspecified site of left female breast: Secondary | ICD-10-CM | POA: Diagnosis not present

## 2024-06-08 DIAGNOSIS — E049 Nontoxic goiter, unspecified: Secondary | ICD-10-CM | POA: Diagnosis not present

## 2024-06-08 DIAGNOSIS — E1165 Type 2 diabetes mellitus with hyperglycemia: Secondary | ICD-10-CM | POA: Diagnosis not present

## 2024-06-08 DIAGNOSIS — I10 Essential (primary) hypertension: Secondary | ICD-10-CM | POA: Diagnosis not present

## 2024-06-08 DIAGNOSIS — E0510 Thyrotoxicosis with toxic single thyroid nodule without thyrotoxic crisis or storm: Secondary | ICD-10-CM | POA: Diagnosis not present

## 2024-06-09 ENCOUNTER — Ambulatory Visit

## 2024-06-09 DIAGNOSIS — R0789 Other chest pain: Secondary | ICD-10-CM

## 2024-06-09 DIAGNOSIS — C50412 Malignant neoplasm of upper-outer quadrant of left female breast: Secondary | ICD-10-CM

## 2024-06-09 DIAGNOSIS — Z9013 Acquired absence of bilateral breasts and nipples: Secondary | ICD-10-CM | POA: Diagnosis not present

## 2024-06-09 DIAGNOSIS — R293 Abnormal posture: Secondary | ICD-10-CM | POA: Diagnosis not present

## 2024-06-09 DIAGNOSIS — M25512 Pain in left shoulder: Secondary | ICD-10-CM | POA: Diagnosis not present

## 2024-06-09 DIAGNOSIS — Z171 Estrogen receptor negative status [ER-]: Secondary | ICD-10-CM | POA: Diagnosis not present

## 2024-06-09 NOTE — Therapy (Signed)
 OUTPATIENT PHYSICAL THERAPY  UPPER EXTREMITY ONCOLOGY TREATMENT  Patient Name: EVALISSE PRAJAPATI MRN: [id number] DOB:1951-09-14, 73 y.o., female Today's Date: 06/09/2024  END OF SESSION:  PT End of Session - 06/09/24 1101     Visit Number 11    Number of Visits 15    Date for PT Re-Evaluation 07/01/24    PT Start Time 1102    PT Stop Time 1154    PT Time Calculation (min) 52 min    Activity Tolerance Patient tolerated treatment well    Behavior During Therapy WFL for tasks assessed/performed           Past Medical History:  Diagnosis Date   AICD (automatic cardioverter/defibrillator) present    Anemia    Anginal pain (HCC)    Anxiety    Arthritis    Lumbar spine DDD.   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   Diabetes mellitus without complication (HCC)    Type II   Dyspnea    When patient gets sick uses Pro-Air inhaler   Frequent sinus infections    GERD (gastroesophageal reflux disease)    occ   Goiter    History of cardiomegaly    History of cataract    Bilateral   History of kidney stones    08/28/2018 currently has a large kidney stone, asymptomatic at this time   History of migraine    History of uterine fibroid    History of uterine prolapse    Hyperlipidemia    Hypertension    LBBB (left bundle branch block)    Menopause 03/03/2012   Nasal turbinate hypertrophy 11/29/2016   Left inferior    Personal history of chemotherapy    Personal history of radiation therapy    Pneumonia    as a child   PONV (postoperative nausea and vomiting)    Sinusitis 2014   Sleep apnea    No CPAP   SUI (stress urinary incontinence, female)    SVD (spontaneous vaginal delivery)    x 2   Thyroid  nodule    Past Surgical History:  Procedure Laterality Date   ABDOMINAL HYSTERECTOMY      BILATERAL SALPINGOOPHORECTOMY Bilateral    BIV ICD INSERTION CRT-D N/A 08/24/2022   Procedure: BIV ICD INSERTION CRT-D;  Surgeon: Waddell Danelle ORN, MD;  Location[facility] INVASIVE CV LAB;  Service: Cardiovascular;  Laterality: N/A;   BIV ICD INSERTION CRT-D N/A 11/07/2023   Procedure: BIV ICD INSERTION CRT-D;  Surgeon: Waddell Danelle ORN, MD;  Location: [facility] INVASIVE CV LAB;  Service: Cardiovascular;  Laterality: N/A;   BLADDER SUSPENSION N/A 05/16/2017   Procedure: TRANSVAGINAL TAPE (TVT) PROCEDURE;  Surgeon: Henry Slough, MD;  Location: [facility] ORS;  Service: Gynecology;  Laterality: N/A;   BREAST BIOPSY Right 2009   BREAST IMPLANT REMOVAL Bilateral 02/05/2022   Procedure: Removal bilateral breast implants;  Surgeon: Elisabeth Craig RAMAN, MD;  Location: [facility] OR;  Service: Plastics;  Laterality: Bilateral;   BREAST LUMPECTOMY Right 2009   BREAST LUMPECTOMY Left 2006   BREAST RECONSTRUCTION WITH PLACEMENT OF TISSUE EXPANDER AND FLEX HD (ACELLULAR HYDRATED DERMIS) Bilateral 09/04/2021   Procedure: BILATERAL BREAST RECONSTRUCTION WITH PLACEMENT OF TISSUE EXPANDER AND FLEX HD (ACELLULAR HYDRATED DERMIS);  Surgeon: Elisabeth Craig RAMAN, MD;  Location: [facility] OR;  Service:  Plastics;  Laterality: Bilateral;   CARDIAC CATHETERIZATION     CATARACT EXTRACTION Left 10/23/2017   CATARACT EXTRACTION Right 11/06/2017   COLONOSCOPY  10/22/2009   Normal.  Repeat 5 years.  [name]   COLONOSCOPY WITH PROPOFOL  N/A 12/21/2022   Procedure: COLONOSCOPY WITH PROPOFOL ;  Surgeon: Rollin Dover, MD;  Location: [facility];  Service: Gastroenterology;  Laterality: N/A;   CYSTOSCOPY N/A 05/16/2017   Procedure: CYSTOSCOPY;  Surgeon: Henry Slough, MD;  Location: [facility];  Service: Gynecology;  Laterality: N/A;   DEBRIDEMENT AND CLOSURE WOUND Bilateral 10/03/2021   Procedure: Debridement bilateral mastectomy flaps;  Surgeon: Elisabeth Craig RAMAN, MD;  Location: [facility] OR;  Service: Plastics;  Laterality: Bilateral;  1.5 hour   EYE SURGERY     bilateral  cataracts   LEAD EXTRACTION N/A 11/07/2023   Procedure: LEAD EXTRACTION;  Surgeon: Waddell Danelle ORN, MD;  Location: [facility] INVASIVE CV LAB;  Service: Cardiovascular;  Laterality: N/A;   LEFT HEART CATH AND CORONARY ANGIOGRAPHY N/A 02/09/2020   Procedure: LEFT HEART CATH AND CORONARY ANGIOGRAPHY;  Surgeon: Elmira Newman PARAS, MD;  Location: [facility] INVASIVE CV LAB;  Service: Cardiovascular;  Laterality: N/A;   LEFT HEART CATH AND CORONARY ANGIOGRAPHY N/A 03/27/2022   Procedure: LEFT HEART CATH AND CORONARY ANGIOGRAPHY;  Surgeon: Ladona Heinz, MD;  Location: [facility] INVASIVE CV LAB;  Service: Cardiovascular;  Laterality: N/A;   MASS EXCISION Right 12/04/2022   Procedure: EXCISION OF RIGHT CHEST WALL MASS;  Surgeon: Aron Shoulders, MD;  Location: [facility] OR;  Service: General;  Laterality: Right;   MASTECTOMY W/ SENTINEL NODE BIOPSY Right 09/04/2021   Procedure: BILATERAL MASTECTOMIES WITH RIGHT SENTINEL LYMPH NODE BIOPSY;  Surgeon: Aron Shoulders, MD;  Location: [facility] OR;  Service: General;  Laterality: Right;   REMOVAL OF BILATERAL TISSUE EXPANDERS WITH PLACEMENT OF BILATERAL BREAST IMPLANTS Bilateral 10/03/2021   Procedure: REMOVAL OF BILATERAL TISSUE EXPANDERS WITH REPLACEMENT OF TISSUE EXPANDERS;  Surgeon: Elisabeth Craig RAMAN, MD;  Location: [facility] OR;  Service: Plastics;  Laterality: Bilateral;   REMOVAL OF BILATERAL TISSUE EXPANDERS WITH PLACEMENT OF BILATERAL BREAST IMPLANTS Bilateral 12/19/2021   Procedure: REMOVAL OF BILATERAL TISSUE EXPANDERS WITH PLACEMENT OF BILATERAL BREAST IMPLANTS;  Surgeon: Elisabeth Craig RAMAN, MD;  Location: [facility] OR;  Service: Plastics;  Laterality: Bilateral;   ROBOTIC ASSISTED TOTAL HYSTERECTOMY N/A 05/16/2017   Procedure: ROBOTIC ASSISTED TOTAL HYSTERECTOMY;  Surgeon: Darcel Pool, MD;  Location: [facility];  Service: Gynecology;  Laterality: N/A;   ROTATOR CUFF REPAIR Bilateral    SCAR REVISION N/A 12/04/2022   Procedure: REVISION OF MASTECTOMY SCAR;  Surgeon: Aron Shoulders, MD;  Location: [facility] OR;  Service:  General;  Laterality: N/A;   SHOULDER ARTHROSCOPY WITH ROTATOR CUFF REPAIR AND SUBACROMIAL DECOMPRESSION Right 09/03/2018   Procedure: Right shoulder manipulation under anesthesia, exam under anesthesia, mini open rotator cuff repair, subacromial decompression;  Surgeon: Duwayne Purchase, MD;  Location: [facility];  Service: Orthopedics;  Laterality: Right;  90 mins   TRANSESOPHAGEAL ECHOCARDIOGRAM (CATH LAB) N/A 11/07/2023   Procedure: TRANSESOPHAGEAL ECHOCARDIOGRAM;  Surgeon: Waddell Danelle ORN, MD;  Location: [facility] INVASIVE CV LAB;  Service: Cardiovascular;  Laterality: N/A;   TUBAL LIGATION     WISDOM TOOTH EXTRACTION     Patient Active Problem List   Diagnosis Date Noted   Chronic systolic heart failure (HCC) 11/07/2023   ICD (implantable cardioverter-defibrillator) in place 12/25/2022   Bradycardia 08/24/2022   BRCA2 gene mutation positive in female 07/30/2022   LBBB (left bundle branch block)    Chronic  diastolic heart failure (HCC)    NICM (nonischemic cardiomyopathy) (HCC)    Breast cancer of upper-outer quadrant of right female breast (HCC) 09/04/2021   Diabetic neuropathy (HCC) 02/08/2021   Angina pectoris (HCC) 02/09/2020   Abnormal stress test 02/09/2020   Complete rotator cuff tear 09/03/2018   Pelvic prolapse 05/16/2017   Intractable chronic post-traumatic headache 11/19/2016   Malignant neoplasm of upper-outer quadrant of left breast in female, estrogen receptor negative (HCC) 03/03/2012   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   Menopause 03/03/2012    PCP: Ryan Hives, MD  REFERRING PROVIDER: Mackey Chad, MD  REFERRING DIAG: s/p breast Cancer/ shoulder pain  THERAPY DIAG:  History of bilateral mastectomy  Malignant neoplasm of upper-outer quadrant of left breast in female, estrogen receptor negative (HCC)  Left shoulder pain, unspecified chronicity  Abnormal posture  Muscular chest pain  ONSET DATE: 11/07/2023  Rationale for Evaluation and  Treatment: Rehabilitation  SUBJECTIVE:                                                                                                                                                                                           SUBJECTIVE STATEMENT:  I had to miss my last appt because my cousin was killed in a car accident. I have been doing my exercises. I started at  [employer] [facility] class in [place] that a friend encouraged her to go to.   PERTINENT HISTORY:   Pt was diagnosed with right breast cancer dx 05/2021. She has BRCA2 genetic mutation and has had left breast cancer in 2006 with chemo/breast conservation and XRT. She then had right breast cancer (dcis)  in 2009 with lumpectomy and radiation only. In 2022 she presented  with a palpable painful mass in the upper outer right breast. Dx imaging showed a 1.2 cm mass at 10 o'clock. 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  Pt underwent bilateral mastectomies with right sentinel node biopsy and expander reconstruction (Pace) 09/04/21. She had 6 negative nodes and negative margins. Patient ended up having the expanders removed 01/26/2022 because of continued drainage and discomfort.  2.14.2024 : revision of left mastectomy scar and removal of mass on right lateral mastectomy scar. Core needle biopsy was performed and this demonstrated invasive ductal carcinoma, grade 3. This was weakly ER positive, PR negative, HER2 positive, Ki-67 70%. She had 11 cycles of Breast Maintenane Trastuzumab    Pt also has a Investment banker, corporate.  She has a decrease ejection fraction of 20- 30% .   PAIN:  Are you having pain? Yes NPRS scale: 7/10 present on left shoulder and  chest and 2-3/10 on right Pain location: bilateral chest greater on left and left upper arm Pain orientation: Bilateral  PAIN TYPE: aching, dull, sharp, and tight Pain description: constant on the left chest, but can be sharp,  Aggravating factors: sleeping on left,  reaching Relieving factors: massaging, topical, ibuprofen   PRECAUTIONS: at risk for lymphedema , pacemaker, defibrillator, DM, Right RTC repair 2019, cardiomyopathy, LV EF 20-25%  RED FLAGS: None   WEIGHT BEARING RESTRICTIONS: No  FALLS:  Has patient fallen in last 6 months? No  LIVING ENVIRONMENT: Lives with: pts mom lives with her and her daughter temporarily Lives in: House/apartment  OCCUPATION: does not work  LEISURE: walks 3 days /week, can walk up to 2 miles with breaks  HAND DOMINANCE: right   PRIOR LEVEL OF FUNCTION: Independent  PATIENT GOALS: Decrease pain, improve ROM   OBJECTIVE: Note: Objective measures were completed at Evaluation unless otherwise noted.  COGNITION: Overall cognitive status: Within functional limits for tasks assessed   PALPATION: Tender bilateral UT, pectorals, incision areas from pacemaker B  OBSERVATIONS / OTHER ASSESSMENTS: Bilateral mastectomy incisions healed but very restricted, keloid scar right chest from pacemaker removal, and new keloid scar on left chest from new pacemaker.defibrillator placement.  SENSATION: Light touch: Deficits     POSTURE: forward head rounded shoulders   UPPER EXTREMITY AROM/PROM:  A/PROM RIGHT   eval  RIGHT 04/20/2024 RIGHT 05/20/2024  Shoulder extension 48 51 53  Shoulder flexion 127(pulls chest) 146 146 tight   Shoulder abduction 110, pain pulling chest 146 150  Shoulder internal rotation     Shoulder external rotation       (Blank rows = not tested)  A/PROM LEFT   eval LEFT 04/20/2024 LEFT 05/20/2024  Shoulder extension 40, tight 46 46  Shoulder flexion 65 (tight/pain chest) 102, chest pain 140 tight, ant shoulder pain  Shoulder abduction 70 ( tight chest) 115  chest pain 130  Shoulder internal rotation     Shoulder external rotation       (Blank rows = not tested)  CERVICAL AROM: All within functional limits: Limited 40-50% with Bilateral rotation and SB  UPPER EXTREMITY  STRENGTH:   LYMPHEDEMA ASSESSMENTS:   SURGERY TYPE/DATE: Multiple surgeries 2006, 2013 2022, 2024 B.Mastectomies with reconstruction, Implants removed 2023 due to non healing wound,  NUMBER OF LYMPH NODES REMOVED: 6 right  CHEMOTHERAPY: YES, ongoing  RADIATION:YEs  HORMONE TREATMENT: Yes, Anastrozole   INFECTIONS: non healing wound requiring removal of implants    LYMPHEDEMA ASSESSMENTS:   LANDMARK RIGHT  eval  At axilla    15 cm proximal to olecranon process   10 cm proximal to olecranon process   Olecranon process   15 cm proximal to ulnar styloid process   10 cm proximal to ulnar styloid process   Just proximal to ulnar styloid process   Across hand at thumb web space   At base of 2nd digit   (Blank rows = not tested)  LANDMARK LEFT  eval  At axilla    15 cm proximal to olecranon process   10 cm proximal to olecranon process   Olecranon process   15 cm proximal to ulnar styloid process   10 cm proximal to ulnar styloid process   Just proximal to ulnar styloid process   Across hand at thumb web space   At base of 2nd digit   (Blank rows = not tested)   FUNCTIONAL TESTS:    GAIT: WNL   QUICK DASH SURVEY: 36.36  TREATMENT DATE: 06/09/2024  Ball rolls on wall x 5 flex and bilateral abd x 3 ea Jobes flexion and scaption x 5 ea Shoulder Ranger flexion x 10 ea Snow angels x 8 STM to bilateral UT, Right pectorals and bilateral lateral trunk with cocoa butter, scar massage left mastectomy incision PROM bilateral shoulder flex, scaption, abd, IR and ER with VC's prn to relax 05/27/2024 Scapular retraction,  shoulder extension and ER all bilateral with yellow x 12 ea Supine wand flexion and scaption x 3 ea Supine snow angel x 5 STM to bilateral UT, Right pectorals and bilateral lateral trunk with cocoa butter, scar massage left mastectomy  incision PROM bilateral shoulder flex, scaption, abd, IR and ER with VC's prn to relax mainly on left 05/20/2024 Discussed improvements and deficits for PN Supine wand flexion and scaption x 4 ea PROM bilateral shoulder flexion scaption, abduction, IR and ER 3 D bilateral AROM flexion, scaption horizontal abd x 5 Snow angels x 5 B STM to bilateral UT, Right pectorals and bilateral lateral trunk with cocoa butter, scar massage left mastectomy incision PROM bilateral shoulder flex, scaption, abd, IR and ER with VC's prn to relax mainly on left  05/01/2024 STM to bilateral UT, Right pectorals and bilateral lateral trunk with cocoa butter, scar massage left mastectomy incision PROM bilateral shoulder flex, scaption, abd, IR and ER with VC's prn to relax mainly on left Supine wand flexion and scaption x 3, 5 sec hold with exhale at the end Star gazer x 5, 5 sec hold Single arm pec stretch at doorway x 3 ea, 20 sec scapular retraction, shoulder extension, and bilateral ER x 10 with yellow band  04/27/2024 Instructed in standing postural exercises;scapular retraction, shoulder extension, and bilateral ER x 10 with yellow band Single arm pec stretch at doorway x 3 ea, 20 sec Supine wand flexion and scaption x 3, 5 sec hold with exhale at the end Star gazer x 5, 5 sec hold STM to bilateral UT, Right pectorals and bilateral lateral trunk with cocoa butter, scar massage left mastectomy incision PROM bilateral shoulder flex, scaption, abd, IR and ER with VC's prn to relax mainly on left Updated HEP with band exs and advised to do 3x/week and not to the exclusion of her stretches  04/20/2024 Supine wand flexion and scaption x 5, 5 sec hold with exhale at the end Star gazer x 5 Snow angels x 5 B, 5 sec hold STM to bilateral UT, Right pectorals and bilateral lateral trunk with cocoa butter, scar massage left mastectomy incision PROM bilateral shoulder flex, scaption, abd, IR and ER with VC's prn to  relax 3 D AROM Flexion, scaption, horizontal abd x 3 Measured AROM bilateral shoulders and checked STG's for pt. Progress.  04/16/2024 Supine wand flexion and scaption x 5, 5 sec hold with exhale at the end Star gazer x 5 Snow angels x 5 B, 5 sec hold 3D bilateral flexion, scaption, horizontal abd x 4 MFR to bilateral chest regions avoiding pacemaker on left,  STM to bilateral UT, Right pectorals and bilateral lateral trunk with cocoa butter, scar massage left mastectomy incision  04/13/2024 Supine wand flexion and scaption x 5, 5 sec hold with exhale at the end Star gazer x 5 Snow angels x 5 B, 5 sec hold MFR to bilateral chest regions avoiding pacemaker on left,  STM to bilateral UT, Right pectorals and bilateral lateral trunk with cocoa butter, scar massage left mastectomy incision PROM bilateral shoulder flex, scaption, abd, IR  and ER with VC's prn to relax Showed SB stretch to stretch lateral trunk  04/08/2024 B Scapular retraction x 5 Bilateral shoulder abd x 4 with wall slides Supine wand flexion and scaption x 3, 5 sec hold with exhale at the end [facility] angels x 5 B, 5 sec hold MFR to bilateral chest regions avoiding pacemaker on left PROM bilateral shoulder flex, scaption, abd, IR and ER with VC's prn to relax 04/02/24: Therapeutic Exercises Pulleys into flex x 2 mins and abd x 3 mins with pt returning therapist demo and VC's during to relax shoulders. Pt reported starting to feel increased discomfort at pacemaker site Manual Therapy P/ROM in supine to bil shoulders into flex, abd and D2, carefully on Lt side due to pacemaker, scapular depression by therapist throughout STM over areas of mastectomy and Rt pacemaker scar tissue but avoided area of current pace maker placement on Lt  03/30/2024 Discussed POC, LOS, treatment interventions.. Instructed in supine wand exs for flexion and scaption x 5 ea and supine stargazer x 5. Updated HEP with illustrated and written instructions,  also showed scapular retraction, but forgot to give pic. Discussed new charging rule for DN.   PATIENT EDUCATION:  Education details: supine wand exs for flexion and scaption x 5 ea and supine stargazer. Person educated: Patient Education method: Explanation, Demonstration, and Handouts Education comprehension: verbalized understanding and returned demonstration  HOME EXERCISE PROGRAM: supine wand exs for flexion and scaption x 5 ea and supine stargazer x 5 to perform 2x/day Standing postural exs with band, SR, ext , ER with yelow  ASSESSMENT:  CLINICAL IMPRESSION:  AROM appears slightly limited today with jobes flexion and scaption, Right axillary cording present and tightness noted in pectorals, with multiple shortened areas noted  OBJECTIVE IMPAIRMENTS: decreased activity tolerance, decreased knowledge of condition, decreased ROM, decreased strength, increased fascial restrictions, impaired flexibility, impaired UE functional use, postural dysfunction, and pain.   ACTIVITY LIMITATIONS: carrying, sleeping, dressing, reach over head, and hygiene/grooming  PARTICIPATION LIMITATIONS: cleaning, laundry, yard work, and activities requiring significant use of arms  PERSONAL FACTORS: 3+ comorbidities: Multiple breast surgeries, chemo, radiation, pacemaker swithced right to left are also affecting patient's functional outcome.   REHAB POTENTIAL: Good  CLINICAL DECISION MAKING: Evolving/moderate complexity  EVALUATION COMPLEXITY: Moderate  GOALS: Goals reviewed with patient? Yes  SHORT TERM GOALS: Target date: 04/20/2024  Pt will be independent in a HEP for bilateral shoulder ROM/strength Baseline: Goal status: MET 04/20/2024  2.  Pt will have decreased chest/arm pain by 25% Baseline:  Goal status: MET 6/30 Left arm, Right chest, MET  Left chest  05/20/2024 3.  Pt will improve Shoulder AROM for Left flexion and  B abd  by 25-30 degrees Baseline:  Goal status: MET 04/20/2024  LONG  TERM GOALS: Target date: 05/11/2024  Pt will have bilateral shoulder ROM for flexion and scaption to 125 degrees for improved reaching abiliy Baseline:  Goal status: MET 05/20/2024 2.  Pt will report decreased chest and left UE pain by 50% or greater Baseline:  Goal status: MET for LEFT UE, In progress for left chest pain  3.  Quick dash will improve to no greater than 20% to demonstrate improved function Baseline:  Goal status: In Progress 4.  Pt will have improved sleep by 50% or more to be more rested during the day Baseline:  Goal status: In Progress able to get slightly more comfortable , but takes multiple fluid pills   PLAN:  PT FREQUENCY: 1x/week  PT  DURATION: 4-6 weeks  PLANNED INTERVENTIONS: 02835- PT Re-evaluation, 97750- Physical Performance Testing, 97110-Therapeutic exercises, 97530- Therapeutic activity, [id number]- Neuromuscular re-education, 97535- Self Care, 02859- Manual therapy, 20560 (1-2 muscles), 20561 (3+ muscles)- Dry Needling, and Patient/Family education  PLAN FOR NEXT SESSION: Review band,  Jobes exs,STM bilateral UT, axillary border of pecs, sternal border as appropriate,Careful on left due to Pacemaker. MFR to bilateral chest areas, Mastectomy scar massage,cupping,PROM, update HEP, Progress to band exercises.  Grayce JINNY Sheldon, PT 06/09/2024, 12:01 PM

## 2024-06-11 ENCOUNTER — Other Ambulatory Visit (HOSPITAL_COMMUNITY): Payer: Self-pay | Admitting: Endocrinology

## 2024-06-11 DIAGNOSIS — E059 Thyrotoxicosis, 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: Secondary | ICD-10-CM

## 2024-06-16 ENCOUNTER — Ambulatory Visit

## 2024-06-16 DIAGNOSIS — C50412 Malignant neoplasm of upper-outer quadrant of left female breast: Secondary | ICD-10-CM | POA: Diagnosis not present

## 2024-06-16 DIAGNOSIS — R0789 Other chest pain: Secondary | ICD-10-CM | POA: Diagnosis not present

## 2024-06-16 DIAGNOSIS — Z9013 Acquired absence of bilateral breasts and nipples: Secondary | ICD-10-CM | POA: Diagnosis not present

## 2024-06-16 DIAGNOSIS — Z171 Estrogen receptor negative status [ER-]: Secondary | ICD-10-CM | POA: Diagnosis not present

## 2024-06-16 DIAGNOSIS — M25512 Pain in left shoulder: Secondary | ICD-10-CM

## 2024-06-16 DIAGNOSIS — R293 Abnormal posture: Secondary | ICD-10-CM | POA: Diagnosis not present

## 2024-06-16 NOTE — Therapy (Signed)
 OUTPATIENT PHYSICAL THERAPY  UPPER EXTREMITY ONCOLOGY TREATMENT  Patient Name: Jordan Willis MRN: [id number] DOB:Nov 30, 1950, 73 y.o., female Today's Date: 06/16/2024  END OF SESSION:  PT End of Session - 06/16/24 1109     Visit Number 12    Number of Visits 15    Date for PT Re-Evaluation 07/01/24    Authorization Type none needed    PT Start Time 1109    PT Stop Time 1155    PT Time Calculation (min) 46 min    Activity Tolerance Patient tolerated treatment well    Behavior During Therapy WFL for tasks assessed/performed           Past Medical History:  Diagnosis Date   AICD (automatic cardioverter/defibrillator) present    Anemia    Anginal pain (HCC)    Anxiety    Arthritis    Lumbar spine DDD.   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   Diabetes mellitus without complication (HCC)    Type II   Dyspnea    When patient gets sick uses Pro-Air inhaler   Frequent sinus infections    GERD (gastroesophageal reflux disease)    occ   Goiter    History of cardiomegaly    History of cataract    Bilateral   History of kidney stones    08/28/2018 currently has a large kidney stone, asymptomatic at this time   History of migraine    History of uterine fibroid    History of uterine prolapse    Hyperlipidemia    Hypertension    LBBB (left bundle branch block)    Menopause 03/03/2012   Nasal turbinate hypertrophy 11/29/2016   Left inferior    Personal history of chemotherapy    Personal history of radiation therapy    Pneumonia    as a child   PONV (postoperative nausea and vomiting)    Sinusitis 2014   Sleep apnea    No CPAP   SUI (stress urinary incontinence, female)    SVD (spontaneous vaginal delivery)    x 2   Thyroid  nodule    Past Surgical History:  Procedure Laterality  Date   ABDOMINAL HYSTERECTOMY     BILATERAL SALPINGOOPHORECTOMY Bilateral    BIV ICD INSERTION CRT-D N/A 08/24/2022   Procedure: BIV ICD INSERTION CRT-D;  Surgeon: Waddell Danelle ORN, MD;  Location: [facility] INVASIVE CV LAB;  Service: Cardiovascular;  Laterality: N/A;   BIV ICD INSERTION CRT-D N/A 11/07/2023   Procedure: BIV ICD INSERTION CRT-D;  Surgeon: Waddell Danelle ORN, MD;  Location: [facility] INVASIVE CV LAB;  Service: Cardiovascular;  Laterality: N/A;   BLADDER SUSPENSION N/A 05/16/2017   Procedure: TRANSVAGINAL TAPE (TVT) PROCEDURE;  Surgeon: Henry Slough, MD;  Location: [facility] ORS;  Service: Gynecology;  Laterality: N/A;   BREAST BIOPSY Right 2009   BREAST IMPLANT REMOVAL Bilateral 02/05/2022   Procedure: Removal bilateral breast implants;  Surgeon: Elisabeth Craig RAMAN, MD;  Location: [facility] OR;  Service: Plastics;  Laterality: Bilateral;   BREAST LUMPECTOMY Right 2009   BREAST LUMPECTOMY Left 2006   BREAST RECONSTRUCTION WITH PLACEMENT OF TISSUE EXPANDER AND FLEX HD (ACELLULAR HYDRATED DERMIS) Bilateral 09/04/2021   Procedure: BILATERAL BREAST RECONSTRUCTION WITH PLACEMENT OF TISSUE EXPANDER AND FLEX HD (ACELLULAR HYDRATED DERMIS);  Surgeon: Elisabeth Craig RAMAN,  MD;  Location: [facility] OR;  Service: Plastics;  Laterality: Bilateral;   CARDIAC CATHETERIZATION     CATARACT EXTRACTION Left 10/23/2017   CATARACT EXTRACTION Right 11/06/2017   COLONOSCOPY  10/22/2009   Normal.  Repeat 5 years.  [name]   COLONOSCOPY WITH PROPOFOL  N/A 12/21/2022   Procedure: COLONOSCOPY WITH PROPOFOL ;  Surgeon: Rollin Dover, MD;  Location: [facility];  Service: Gastroenterology;  Laterality: N/A;   CYSTOSCOPY N/A 05/16/2017   Procedure: CYSTOSCOPY;  Surgeon: Henry Slough, MD;  Location: [facility];  Service: Gynecology;  Laterality: N/A;   DEBRIDEMENT AND CLOSURE WOUND Bilateral 10/03/2021   Procedure: Debridement bilateral mastectomy flaps;  Surgeon: Elisabeth Craig RAMAN, MD;  Location: [facility] OR;  Service: Plastics;  Laterality: Bilateral;  1.5 hour    EYE SURGERY     bilateral cataracts   LEAD EXTRACTION N/A 11/07/2023   Procedure: LEAD EXTRACTION;  Surgeon: Waddell Danelle ORN, MD;  Location: [facility] INVASIVE CV LAB;  Service: Cardiovascular;  Laterality: N/A;   LEFT HEART CATH AND CORONARY ANGIOGRAPHY N/A 02/09/2020   Procedure: LEFT HEART CATH AND CORONARY ANGIOGRAPHY;  Surgeon: Elmira Newman PARAS, MD;  Location: [facility] INVASIVE CV LAB;  Service: Cardiovascular;  Laterality: N/A;   LEFT HEART CATH AND CORONARY ANGIOGRAPHY N/A 03/27/2022   Procedure: LEFT HEART CATH AND CORONARY ANGIOGRAPHY;  Surgeon: Ladona Heinz, MD;  Location: [facility] INVASIVE CV LAB;  Service: Cardiovascular;  Laterality: N/A;   MASS EXCISION Right 12/04/2022   Procedure: EXCISION OF RIGHT CHEST WALL MASS;  Surgeon: Aron Shoulders, MD;  Location: [facility] OR;  Service: General;  Laterality: Right;   MASTECTOMY W/ SENTINEL NODE BIOPSY Right 09/04/2021   Procedure: BILATERAL MASTECTOMIES WITH RIGHT SENTINEL LYMPH NODE BIOPSY;  Surgeon: Aron Shoulders, MD;  Location: [facility] OR;  Service: General;  Laterality: Right;   REMOVAL OF BILATERAL TISSUE EXPANDERS WITH PLACEMENT OF BILATERAL BREAST IMPLANTS Bilateral 10/03/2021   Procedure: REMOVAL OF BILATERAL TISSUE EXPANDERS WITH REPLACEMENT OF TISSUE EXPANDERS;  Surgeon: Elisabeth Craig RAMAN, MD;  Location: [facility] OR;  Service: Plastics;  Laterality: Bilateral;   REMOVAL OF BILATERAL TISSUE EXPANDERS WITH PLACEMENT OF BILATERAL BREAST IMPLANTS Bilateral 12/19/2021   Procedure: REMOVAL OF BILATERAL TISSUE EXPANDERS WITH PLACEMENT OF BILATERAL BREAST IMPLANTS;  Surgeon: Elisabeth Craig RAMAN, MD;  Location: [facility] OR;  Service: Plastics;  Laterality: Bilateral;   ROBOTIC ASSISTED TOTAL HYSTERECTOMY N/A 05/16/2017   Procedure: ROBOTIC ASSISTED TOTAL HYSTERECTOMY;  Surgeon: Darcel Pool, MD;  Location: [facility];  Service: Gynecology;  Laterality: N/A;   ROTATOR CUFF REPAIR Bilateral    SCAR REVISION N/A 12/04/2022   Procedure: REVISION OF MASTECTOMY SCAR;  Surgeon: Aron Shoulders, MD;   Location: [facility] OR;  Service: General;  Laterality: N/A;   SHOULDER ARTHROSCOPY WITH ROTATOR CUFF REPAIR AND SUBACROMIAL DECOMPRESSION Right 09/03/2018   Procedure: Right shoulder manipulation under anesthesia, exam under anesthesia, mini open rotator cuff repair, subacromial decompression;  Surgeon: Duwayne Purchase, MD;  Location: [facility];  Service: Orthopedics;  Laterality: Right;  90 mins   TRANSESOPHAGEAL ECHOCARDIOGRAM (CATH LAB) N/A 11/07/2023   Procedure: TRANSESOPHAGEAL ECHOCARDIOGRAM;  Surgeon: Waddell Danelle ORN, MD;  Location: [facility] INVASIVE CV LAB;  Service: Cardiovascular;  Laterality: N/A;   TUBAL LIGATION     WISDOM TOOTH EXTRACTION     Patient Active Problem List   Diagnosis Date Noted   Chronic systolic heart failure (HCC) 11/07/2023   ICD (implantable cardioverter-defibrillator) in place 12/25/2022   Bradycardia 08/24/2022   BRCA2 gene mutation positive in female 07/30/2022   LBBB (left  bundle branch block)    Chronic diastolic heart failure (HCC)    NICM (nonischemic cardiomyopathy) (HCC)    Breast cancer of upper-outer quadrant of right female breast (HCC) 09/04/2021   Diabetic neuropathy (HCC) 02/08/2021   Angina pectoris (HCC) 02/09/2020   Abnormal stress test 02/09/2020   Complete rotator cuff tear 09/03/2018   Pelvic prolapse 05/16/2017   Intractable chronic post-traumatic headache 11/19/2016   Malignant neoplasm of upper-outer quadrant of left breast in female, estrogen receptor negative (HCC) 03/03/2012   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   Menopause 03/03/2012    PCP: Ryan Hives, MD  REFERRING PROVIDER: Mackey Chad, MD  REFERRING DIAG: s/p breast Cancer/ shoulder pain  THERAPY DIAG:  History of bilateral mastectomy  Malignant neoplasm of upper-outer quadrant of left breast in female, estrogen receptor negative (HCC)  Left shoulder pain, unspecified chronicity  Abnormal posture  Muscular chest pain  ONSET DATE:  11/07/2023  Rationale for Evaluation and Treatment: Rehabilitation  SUBJECTIVE:                                                                                                                                                                                           SUBJECTIVE STATEMENT:  My mom had to go to the hospital over the weekend; That was very stressful for me. She is better now. I had a few days with pain in both arms and across my chest.  I took some spasm medicine for it and it is better now.   PERTINENT HISTORY:   Pt was diagnosed with right breast cancer dx 05/2021. She has BRCA2 genetic mutation and has had left breast cancer in 2006 with chemo/breast conservation and XRT. She then had right breast cancer (dcis)  in 2009 with lumpectomy and radiation only. In 2022 she presented  with a palpable painful mass in the upper outer right breast. Dx imaging showed a 1.2 cm mass at 10 o'clock. 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  Pt underwent bilateral mastectomies with right sentinel node biopsy and expander reconstruction (Pace) 09/04/21. She had 6 negative nodes and negative margins. Patient ended up having the expanders removed 01/26/2022 because of continued drainage and discomfort.  2.14.2024 : revision of left mastectomy scar and removal of mass on right lateral mastectomy scar. Core needle biopsy was performed and this demonstrated invasive ductal carcinoma, grade 3. This was weakly ER positive, PR negative, HER2 positive, Ki-67 70%. She had 11 cycles of Breast Maintenane Trastuzumab    Pt also has a Investment banker, corporate.  She has a decrease ejection fraction of 20- 30% .  PAIN:  Are you having pain? Yes NPRS scale: 7/10 present on left shoulder and chest and 2-3/10 on right Pain location: bilateral chest greater on left and left upper arm Pain orientation: Bilateral  PAIN TYPE: aching, dull, sharp, and tight Pain description: constant on the  left chest, but can be sharp,  Aggravating factors: sleeping on left, reaching Relieving factors: massaging, topical, ibuprofen   PRECAUTIONS: at risk for lymphedema , pacemaker, defibrillator, DM, Right RTC repair 2019, cardiomyopathy, LV EF 20-25%  RED FLAGS: None   WEIGHT BEARING RESTRICTIONS: No  FALLS:  Has patient fallen in last 6 months? No  LIVING ENVIRONMENT: Lives with: pts mom lives with her and her daughter temporarily Lives in: House/apartment  OCCUPATION: does not work  LEISURE: walks 3 days /week, can walk up to 2 miles with breaks  HAND DOMINANCE: right   PRIOR LEVEL OF FUNCTION: Independent  PATIENT GOALS: Decrease pain, improve ROM   OBJECTIVE: Note: Objective measures were completed at Evaluation unless otherwise noted.  COGNITION: Overall cognitive status: Within functional limits for tasks assessed   PALPATION: Tender bilateral UT, pectorals, incision areas from pacemaker B  OBSERVATIONS / OTHER ASSESSMENTS: Bilateral mastectomy incisions healed but very restricted, keloid scar right chest from pacemaker removal, and new keloid scar on left chest from new pacemaker.defibrillator placement.  SENSATION: Light touch: Deficits     POSTURE: forward head rounded shoulders   UPPER EXTREMITY AROM/PROM:  A/PROM RIGHT   eval  RIGHT 04/20/2024 RIGHT 05/20/2024  Shoulder extension 48 51 53  Shoulder flexion 127(pulls chest) 146 146 tight   Shoulder abduction 110, pain pulling chest 146 150  Shoulder internal rotation     Shoulder external rotation       (Blank rows = not tested)  A/PROM LEFT   eval LEFT 04/20/2024 LEFT 05/20/2024  Shoulder extension 40, tight 46 46  Shoulder flexion 65 (tight/pain chest) 102, chest pain 140 tight, ant shoulder pain  Shoulder abduction 70 ( tight chest) 115  chest pain 130  Shoulder internal rotation     Shoulder external rotation       (Blank rows = not tested)  CERVICAL AROM: All within functional limits:  Limited 40-50% with Bilateral rotation and SB  UPPER EXTREMITY STRENGTH:   LYMPHEDEMA ASSESSMENTS:   SURGERY TYPE/DATE: Multiple surgeries 2006, 2013 2022, 2024 B.Mastectomies with reconstruction, Implants removed 2023 due to non healing wound,  NUMBER OF LYMPH NODES REMOVED: 6 right  CHEMOTHERAPY: YES, ongoing  RADIATION:YEs  HORMONE TREATMENT: Yes, Anastrozole   INFECTIONS: non healing wound requiring removal of implants    LYMPHEDEMA ASSESSMENTS:   LANDMARK RIGHT  eval  At axilla    15 cm proximal to olecranon process   10 cm proximal to olecranon process   Olecranon process   15 cm proximal to ulnar styloid process   10 cm proximal to ulnar styloid process   Just proximal to ulnar styloid process   Across hand at thumb web space   At base of 2nd digit   (Blank rows = not tested)  LANDMARK LEFT  eval  At axilla    15 cm proximal to olecranon process   10 cm proximal to olecranon process   Olecranon process   15 cm proximal to ulnar styloid process   10 cm proximal to ulnar styloid process   Just proximal to ulnar styloid process   Across hand at thumb web space   At base of 2nd digit   (Blank rows = not tested)  FUNCTIONAL TESTS:    GAIT: WNL   QUICK DASH SURVEY: 36.36                                                                                                                            TREATMENT DATE:  06/16/2024 Shoulder ranger x 10 B Jobes flexion and scaption x 10 with back against wall Reach to top shelf with left hand x 7 Snow angels x 8 STM to bilateral UT, Right pectorals and bilateral lateral trunk with cocoa butter, scar massage left mastectomy incision PROM bilateral shoulder flex, scaption, abd, IR and ER with VC's prn to relax  06/09/2024  Ball rolls on wall x 5 flex and bilateral abd x 3 ea Jobes flexion and scaption x 5 ea Shoulder Ranger flexion x 10 ea Snow angels x 8 STM to bilateral UT, Right pectorals and bilateral  lateral trunk with cocoa butter, scar massage left mastectomy incision PROM bilateral shoulder flex, scaption, abd, IR and ER with VC's prn to relax 05/27/2024 Scapular retraction,  shoulder extension and ER all bilateral with yellow x 12 ea Supine wand flexion and scaption x 3 ea Supine snow angel x 5 STM to bilateral UT, Right pectorals and bilateral lateral trunk with cocoa butter, scar massage left mastectomy incision PROM bilateral shoulder flex, scaption, abd, IR and ER with VC's prn to relax mainly on left 05/20/2024 Discussed improvements and deficits for PN Supine wand flexion and scaption x 4 ea PROM bilateral shoulder flexion scaption, abduction, IR and ER 3 D bilateral AROM flexion, scaption horizontal abd x 5 Snow angels x 5 B STM to bilateral UT, Right pectorals and bilateral lateral trunk with cocoa butter, scar massage left mastectomy incision PROM bilateral shoulder flex, scaption, abd, IR and ER with VC's prn to relax mainly on left  05/01/2024 STM to bilateral UT, Right pectorals and bilateral lateral trunk with cocoa butter, scar massage left mastectomy incision PROM bilateral shoulder flex, scaption, abd, IR and ER with VC's prn to relax mainly on left Supine wand flexion and scaption x 3, 5 sec hold with exhale at the end Star gazer x 5, 5 sec hold Single arm pec stretch at doorway x 3 ea, 20 sec scapular retraction, shoulder extension, and bilateral ER x 10 with yellow band  04/27/2024 Instructed in standing postural exercises;scapular retraction, shoulder extension, and bilateral ER x 10 with yellow band Single arm pec stretch at doorway x 3 ea, 20 sec Supine wand flexion and scaption x 3, 5 sec hold with exhale at the end Star gazer x 5, 5 sec hold STM to bilateral UT, Right pectorals and bilateral lateral trunk with cocoa butter, scar massage left mastectomy incision PROM bilateral shoulder flex, scaption, abd, IR and ER with VC's prn to relax mainly on  left Updated HEP with band exs and advised to do 3x/week and not to the exclusion of her stretches  04/20/2024 Supine wand flexion and scaption x 5, 5  sec hold with exhale at the end Star gazer x 5 Snow angels x 5 B, 5 sec hold STM to bilateral UT, Right pectorals and bilateral lateral trunk with cocoa butter, scar massage left mastectomy incision PROM bilateral shoulder flex, scaption, abd, IR and ER with VC's prn to relax 3 D AROM Flexion, scaption, horizontal abd x 3 Measured AROM bilateral shoulders and checked STG's for pt. Progress.  04/16/2024 Supine wand flexion and scaption x 5, 5 sec hold with exhale at the end Star gazer x 5 Snow angels x 5 B, 5 sec hold 3D bilateral flexion, scaption, horizontal abd x 4 MFR to bilateral chest regions avoiding pacemaker on left,  STM to bilateral UT, Right pectorals and bilateral lateral trunk with cocoa butter, scar massage left mastectomy incision  04/13/2024 Supine wand flexion and scaption x 5, 5 sec hold with exhale at the end Star gazer x 5 Snow angels x 5 B, 5 sec hold MFR to bilateral chest regions avoiding pacemaker on left,  STM to bilateral UT, Right pectorals and bilateral lateral trunk with cocoa butter, scar massage left mastectomy incision PROM bilateral shoulder flex, scaption, abd, IR and ER with VC's prn to relax Showed SB stretch to stretch lateral trunk  04/08/2024 B Scapular retraction x 5 Bilateral shoulder abd x 4 with wall slides Supine wand flexion and scaption x 3, 5 sec hold with exhale at the end [facility] angels x 5 B, 5 sec hold MFR to bilateral chest regions avoiding pacemaker on left PROM bilateral shoulder flex, scaption, abd, IR and ER with VC's prn to relax 04/02/24: Therapeutic Exercises Pulleys into flex x 2 mins and abd x 3 mins with pt returning therapist demo and VC's during to relax shoulders. Pt reported starting to feel increased discomfort at pacemaker site Manual Therapy P/ROM in supine to bil  shoulders into flex, abd and D2, carefully on Lt side due to pacemaker, scapular depression by therapist throughout STM over areas of mastectomy and Rt pacemaker scar tissue but avoided area of current pace maker placement on Lt  03/30/2024 Discussed POC, LOS, treatment interventions.. Instructed in supine wand exs for flexion and scaption x 5 ea and supine stargazer x 5. Updated HEP with illustrated and written instructions, also showed scapular retraction, but forgot to give pic. Discussed new charging rule for DN.   PATIENT EDUCATION:  Education details: supine wand exs for flexion and scaption x 5 ea and supine stargazer. Person educated: Patient Education method: Explanation, Demonstration, and Handouts Education comprehension: verbalized understanding and returned demonstration  HOME EXERCISE PROGRAM: supine wand exs for flexion and scaption x 5 ea and supine stargazer x 5 to perform 2x/day Standing postural exs with band, SR, ext , ER with yelow  ASSESSMENT:  CLINICAL IMPRESSION:   Pt had some increased right arm and bilateral chest pain for several days while mom was in hospital that improved with spasm medication. She did well with exs but left arm does fatigue more easily than right with activity. OBJECTIVE IMPAIRMENTS: decreased activity tolerance, decreased knowledge of condition, decreased ROM, decreased strength, increased fascial restrictions, impaired flexibility, impaired UE functional use, postural dysfunction, and pain.   ACTIVITY LIMITATIONS: carrying, sleeping, dressing, reach over head, and hygiene/grooming  PARTICIPATION LIMITATIONS: cleaning, laundry, yard work, and activities requiring significant use of arms  PERSONAL FACTORS: 3+ comorbidities: Multiple breast surgeries, chemo, radiation, pacemaker swithced right to left are also affecting patient's functional outcome.   REHAB POTENTIAL: Good  CLINICAL DECISION MAKING: Evolving/moderate complexity  EVALUATION  COMPLEXITY: Moderate  GOALS: Goals reviewed with patient? Yes  SHORT TERM GOALS: Target date: 04/20/2024  Pt will be independent in a HEP for bilateral shoulder ROM/strength Baseline: Goal status: MET 04/20/2024  2.  Pt will have decreased chest/arm pain by 25% Baseline:  Goal status: MET 6/30 Left arm, Right chest, MET  Left chest  05/20/2024 3.  Pt will improve Shoulder AROM for Left flexion and  B abd  by 25-30 degrees Baseline:  Goal status: MET 04/20/2024  LONG TERM GOALS: Target date: 05/11/2024  Pt will have bilateral shoulder ROM for flexion and scaption to 125 degrees for improved reaching abiliy Baseline:  Goal status: MET 05/20/2024 2.  Pt will report decreased chest and left UE pain by 50% or greater Baseline:  Goal status: MET for LEFT UE, In progress for left chest pain  3.  Quick dash will improve to no greater than 20% to demonstrate improved function Baseline:  Goal status: In Progress 4.  Pt will have improved sleep by 50% or more to be more rested during the day Baseline:  Goal status: In Progress able to get slightly more comfortable , but takes multiple fluid pills   PLAN:  PT FREQUENCY: 1x/week  PT DURATION: 4-6 weeks  PLANNED INTERVENTIONS: 97164- PT Re-evaluation, 97750- Physical Performance Testing, 97110-Therapeutic exercises, 97530- Therapeutic activity, [id number]- Neuromuscular re-education, 97535- Self Care, [id number]- Manual therapy, 20560 (1-2 muscles), 20561 (3+ muscles)- Dry Needling, and Patient/Family education  PLAN FOR NEXT SESSION: Review band,  Jobes exs,STM bilateral UT, axillary border of pecs, sternal border as appropriate,Careful on left due to Pacemaker. MFR to bilateral chest areas, Mastectomy scar massage,cupping,PROM, update HEP, Progress to band exercises.  Grayce JINNY Sheldon, PT 06/16/2024, 12:54 PM

## 2024-06-23 ENCOUNTER — Other Ambulatory Visit: Payer: Self-pay | Admitting: *Deleted

## 2024-06-23 DIAGNOSIS — Z171 Estrogen receptor negative status [ER-]: Secondary | ICD-10-CM

## 2024-06-23 NOTE — Progress Notes (Signed)
 [employer] renewal orders placed.

## 2024-06-24 ENCOUNTER — Ambulatory Visit: Payer: Self-pay | Attending: Hematology and Oncology

## 2024-06-24 DIAGNOSIS — M25612 Stiffness of left shoulder, not elsewhere classified: Secondary | ICD-10-CM | POA: Insufficient documentation

## 2024-06-24 DIAGNOSIS — Z171 Estrogen receptor negative status [ER-]: Secondary | ICD-10-CM | POA: Insufficient documentation

## 2024-06-24 DIAGNOSIS — M25611 Stiffness of right shoulder, not elsewhere classified: Secondary | ICD-10-CM | POA: Insufficient documentation

## 2024-06-24 DIAGNOSIS — Z483 Aftercare following surgery for neoplasm: Secondary | ICD-10-CM | POA: Insufficient documentation

## 2024-06-24 DIAGNOSIS — C50412 Malignant neoplasm of upper-outer quadrant of left female breast: Secondary | ICD-10-CM | POA: Insufficient documentation

## 2024-06-24 DIAGNOSIS — R293 Abnormal posture: Secondary | ICD-10-CM | POA: Diagnosis not present

## 2024-06-24 DIAGNOSIS — M25512 Pain in left shoulder: Secondary | ICD-10-CM | POA: Diagnosis not present

## 2024-06-24 DIAGNOSIS — R0789 Other chest pain: Secondary | ICD-10-CM | POA: Insufficient documentation

## 2024-06-24 DIAGNOSIS — Z9013 Acquired absence of bilateral breasts and nipples: Secondary | ICD-10-CM | POA: Insufficient documentation

## 2024-06-24 NOTE — Therapy (Signed)
 OUTPATIENT PHYSICAL THERAPY  UPPER EXTREMITY ONCOLOGY TREATMENT  Patient Name: Jordan Willis MRN: [id number] DOB:August 17, 1951, 73 y.o., female Today's Date: 06/24/2024  END OF SESSION:  PT End of Session - 06/24/24 1106     Visit Number 13    Number of Visits 15    Date for PT Re-Evaluation 07/01/24    Authorization Type none needed    PT Start Time 1106    PT Stop Time 1150    PT Time Calculation (min) 44 min    Activity Tolerance Patient tolerated treatment well    Behavior During Therapy WFL for tasks assessed/performed           Past Medical History:  Diagnosis Date   AICD (automatic cardioverter/defibrillator) present    Anemia    Anginal pain (HCC)    Anxiety    Arthritis    Lumbar spine DDD.   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   Diabetes mellitus without complication (HCC)    Type II   Dyspnea    When patient gets sick uses Pro-Air inhaler   Frequent sinus infections    GERD (gastroesophageal reflux disease)    occ   Goiter    History of cardiomegaly    History of cataract    Bilateral   History of kidney stones    08/28/2018 currently has a large kidney stone, asymptomatic at this time   History of migraine    History of uterine fibroid    History of uterine prolapse    Hyperlipidemia    Hypertension    LBBB (left bundle branch block)    Menopause 03/03/2012   Nasal turbinate hypertrophy 11/29/2016   Left inferior    Personal history of chemotherapy    Personal history of radiation therapy    Pneumonia    as a child   PONV (postoperative nausea and vomiting)    Sinusitis 2014   Sleep apnea    No CPAP   SUI (stress urinary incontinence, female)    SVD (spontaneous vaginal delivery)    x 2   Thyroid  nodule    Past Surgical History:  Procedure Laterality  Date   ABDOMINAL HYSTERECTOMY     BILATERAL SALPINGOOPHORECTOMY Bilateral    BIV ICD INSERTION CRT-D N/A 08/24/2022   Procedure: BIV ICD INSERTION CRT-D;  Surgeon: Waddell Danelle ORN, MD;  Location: [facility] INVASIVE CV LAB;  Service: Cardiovascular;  Laterality: N/A;   BIV ICD INSERTION CRT-D N/A 11/07/2023   Procedure: BIV ICD INSERTION CRT-D;  Surgeon: Waddell Danelle ORN, MD;  Location: [facility] INVASIVE CV LAB;  Service: Cardiovascular;  Laterality: N/A;   BLADDER SUSPENSION N/A 05/16/2017   Procedure: TRANSVAGINAL TAPE (TVT) PROCEDURE;  Surgeon: Henry Slough, MD;  Location: [facility] ORS;  Service: Gynecology;  Laterality: N/A;   BREAST BIOPSY Right 2009   BREAST IMPLANT REMOVAL Bilateral 02/05/2022   Procedure: Removal bilateral breast implants;  Surgeon: Elisabeth Craig RAMAN, MD;  Location: [facility] OR;  Service: Plastics;  Laterality: Bilateral;   BREAST LUMPECTOMY Right 2009   BREAST LUMPECTOMY Left 2006   BREAST RECONSTRUCTION WITH PLACEMENT OF TISSUE EXPANDER AND FLEX HD (ACELLULAR HYDRATED DERMIS) Bilateral 09/04/2021   Procedure: BILATERAL BREAST RECONSTRUCTION WITH PLACEMENT OF TISSUE EXPANDER AND FLEX HD (ACELLULAR HYDRATED DERMIS);  Surgeon: Elisabeth Craig RAMAN,  MD;  Location: [facility] OR;  Service: Plastics;  Laterality: Bilateral;   CARDIAC CATHETERIZATION     CATARACT EXTRACTION Left 10/23/2017   CATARACT EXTRACTION Right 11/06/2017   COLONOSCOPY  10/22/2009   Normal.  Repeat 5 years.  [name]   COLONOSCOPY WITH PROPOFOL  N/A 12/21/2022   Procedure: COLONOSCOPY WITH PROPOFOL ;  Surgeon: Rollin Dover, MD;  Location: [facility]OSCOPY;  Service: Gastroenterology;  Laterality: N/A;   CYSTOSCOPY N/A 05/16/2017   Procedure: CYSTOSCOPY;  Surgeon: Henry Slough, MD;  Location: [facility] ORS;  Service: Gynecology;  Laterality: N/A;   DEBRIDEMENT AND CLOSURE WOUND Bilateral 10/03/2021   Procedure: Debridement bilateral mastectomy flaps;  Surgeon: Elisabeth Craig RAMAN, MD;  Location: [facility] OR;  Service: Plastics;  Laterality: Bilateral;  1.5 hour    EYE SURGERY     bilateral cataracts   LEAD EXTRACTION N/A 11/07/2023   Procedure: LEAD EXTRACTION;  Surgeon: Waddell Danelle ORN, MD;  Location: [facility] INVASIVE CV LAB;  Service: Cardiovascular;  Laterality: N/A;   LEFT HEART CATH AND CORONARY ANGIOGRAPHY N/A 02/09/2020   Procedure: LEFT HEART CATH AND CORONARY ANGIOGRAPHY;  Surgeon: Elmira Newman PARAS, MD;  Location: [facility] INVASIVE CV LAB;  Service: Cardiovascular;  Laterality: N/A;   LEFT HEART CATH AND CORONARY ANGIOGRAPHY N/A 03/27/2022   Procedure: LEFT HEART CATH AND CORONARY ANGIOGRAPHY;  Surgeon: Ladona Heinz, MD;  Location: [facility] INVASIVE CV LAB;  Service: Cardiovascular;  Laterality: N/A;   MASS EXCISION Right 12/04/2022   Procedure: EXCISION OF RIGHT CHEST WALL MASS;  Surgeon: Aron Shoulders, MD;  Location: [facility] OR;  Service: General;  Laterality: Right;   MASTECTOMY W/ SENTINEL NODE BIOPSY Right 09/04/2021   Procedure: BILATERAL MASTECTOMIES WITH RIGHT SENTINEL LYMPH NODE BIOPSY;  Surgeon: Aron Shoulders, MD;  Location: [facility] OR;  Service: General;  Laterality: Right;   REMOVAL OF BILATERAL TISSUE EXPANDERS WITH PLACEMENT OF BILATERAL BREAST IMPLANTS Bilateral 10/03/2021   Procedure: REMOVAL OF BILATERAL TISSUE EXPANDERS WITH REPLACEMENT OF TISSUE EXPANDERS;  Surgeon: Elisabeth Craig RAMAN, MD;  Location: [facility] OR;  Service: Plastics;  Laterality: Bilateral;   REMOVAL OF BILATERAL TISSUE EXPANDERS WITH PLACEMENT OF BILATERAL BREAST IMPLANTS Bilateral 12/19/2021   Procedure: REMOVAL OF BILATERAL TISSUE EXPANDERS WITH PLACEMENT OF BILATERAL BREAST IMPLANTS;  Surgeon: Elisabeth Craig RAMAN, MD;  Location: [facility] OR;  Service: Plastics;  Laterality: Bilateral;   ROBOTIC ASSISTED TOTAL HYSTERECTOMY N/A 05/16/2017   Procedure: ROBOTIC ASSISTED TOTAL HYSTERECTOMY;  Surgeon: Darcel Pool, MD;  Location: [facility] ORS;  Service: Gynecology;  Laterality: N/A;   ROTATOR CUFF REPAIR Bilateral    SCAR REVISION N/A 12/04/2022   Procedure: REVISION OF MASTECTOMY SCAR;  Surgeon: Aron Shoulders, MD;   Location: [facility] OR;  Service: General;  Laterality: N/A;   SHOULDER ARTHROSCOPY WITH ROTATOR CUFF REPAIR AND SUBACROMIAL DECOMPRESSION Right 09/03/2018   Procedure: Right shoulder manipulation under anesthesia, exam under anesthesia, mini open rotator cuff repair, subacromial decompression;  Surgeon: Duwayne Purchase, MD;  Location: [facility] ORS;  Service: Orthopedics;  Laterality: Right;  90 mins   TRANSESOPHAGEAL ECHOCARDIOGRAM (CATH LAB) N/A 11/07/2023   Procedure: TRANSESOPHAGEAL ECHOCARDIOGRAM;  Surgeon: Waddell Danelle ORN, MD;  Location: [facility] INVASIVE CV LAB;  Service: Cardiovascular;  Laterality: N/A;   TUBAL LIGATION     WISDOM TOOTH EXTRACTION     Patient Active Problem List   Diagnosis Date Noted   Chronic systolic heart failure (HCC) 11/07/2023   ICD (implantable cardioverter-defibrillator) in place 12/25/2022   Bradycardia 08/24/2022   BRCA2 gene mutation positive in female 07/30/2022   LBBB (left  bundle branch block)    Chronic diastolic heart failure (HCC)    NICM (nonischemic cardiomyopathy) (HCC)    Breast cancer of upper-outer quadrant of right female breast (HCC) 09/04/2021   Diabetic neuropathy (HCC) 02/08/2021   Angina pectoris (HCC) 02/09/2020   Abnormal stress test 02/09/2020   Complete rotator cuff tear 09/03/2018   Pelvic prolapse 05/16/2017   Intractable chronic post-traumatic headache 11/19/2016   Malignant neoplasm of upper-outer quadrant of left breast in female, estrogen receptor negative (HCC) 03/03/2012   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   Menopause 03/03/2012    PCP: Ryan Hives, MD  REFERRING PROVIDER: Mackey Chad, MD  REFERRING DIAG: s/p breast Cancer/ shoulder pain  THERAPY DIAG:  History of bilateral mastectomy  Malignant neoplasm of upper-outer quadrant of left breast in female, estrogen receptor negative (HCC)  Left shoulder pain, unspecified chronicity  Abnormal posture  Muscular chest pain  Aftercare following surgery  for neoplasm  Stiffness of left shoulder, not elsewhere classified  Stiffness of right shoulder, not elsewhere classified  ONSET DATE: 11/07/2023  Rationale for Evaluation and Treatment: Rehabilitation  SUBJECTIVE:                                                                                                                                                                                           SUBJECTIVE STATEMENT:  I had 1 real bad spasm last week, but it went away pretty quickly. I was watching TV laying in the bed. I have been trimming with hedge trimmers so I am sore today.  PERTINENT HISTORY:   Pt was diagnosed with right breast cancer dx 05/2021. She has BRCA2 genetic mutation and has had left breast cancer in 2006 with chemo/breast conservation and XRT. She then had right breast cancer (dcis)  in 2009 with lumpectomy and radiation only. In 2022 she presented  with a palpable painful mass in the upper outer right breast. Dx imaging showed a 1.2 cm mass at 10 o'clock. 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  Pt underwent bilateral mastectomies with right sentinel node biopsy and expander reconstruction (Pace) 09/04/21. She had 6 negative nodes and negative margins. Patient ended up having the expanders removed 01/26/2022 because of continued drainage and discomfort.  2.14.2024 : revision of left mastectomy scar and removal of mass on right lateral mastectomy scar. Core needle biopsy was performed and this demonstrated invasive ductal carcinoma, grade 3. This was weakly ER positive, PR negative, HER2 positive, Ki-67 70%. She had 11 cycles of Breast Maintenane Trastuzumab    Pt also has a Investment banker, corporate.  She has a decrease  ejection fraction of 20- 30% .   PAIN:  Are you having pain? Yes NPRS scale: 3-4/10 present on left shoulder and chest and 4-5/10 on right Pain location: bilateral chest greater on left and left upper arm Pain orientation:  Bilateral  PAIN TYPE: aching, dull, sharp, and tight Pain description: constant on the left chest, but can be sharp,  Aggravating factors: sleeping on left, reaching Relieving factors: massaging, topical, ibuprofen   PRECAUTIONS: at risk for lymphedema , pacemaker, defibrillator, DM, Right RTC repair 2019, cardiomyopathy, LV EF 20-25%  RED FLAGS: None   WEIGHT BEARING RESTRICTIONS: No  FALLS:  Has patient fallen in last 6 months? No  LIVING ENVIRONMENT: Lives with: pts mom lives with her and her daughter temporarily Lives in: House/apartment  OCCUPATION: does not work  LEISURE: walks 3 days /week, can walk up to 2 miles with breaks  HAND DOMINANCE: right   PRIOR LEVEL OF FUNCTION: Independent  PATIENT GOALS: Decrease pain, improve ROM   OBJECTIVE: Note: Objective measures were completed at Evaluation unless otherwise noted.  COGNITION: Overall cognitive status: Within functional limits for tasks assessed   PALPATION: Tender bilateral UT, pectorals, incision areas from pacemaker B  OBSERVATIONS / OTHER ASSESSMENTS: Bilateral mastectomy incisions healed but very restricted, keloid scar right chest from pacemaker removal, and new keloid scar on left chest from new pacemaker.defibrillator placement.  SENSATION: Light touch: Deficits     POSTURE: forward head rounded shoulders   UPPER EXTREMITY AROM/PROM:  A/PROM RIGHT   eval  RIGHT 04/20/2024 RIGHT 05/20/2024  Shoulder extension 48 51 53  Shoulder flexion 127(pulls chest) 146 146 tight   Shoulder abduction 110, pain pulling chest 146 150  Shoulder internal rotation     Shoulder external rotation       (Blank rows = not tested)  A/PROM LEFT   eval LEFT 04/20/2024 LEFT 05/20/2024  Shoulder extension 40, tight 46 46  Shoulder flexion 65 (tight/pain chest) 102, chest pain 140 tight, ant shoulder pain  Shoulder abduction 70 ( tight chest) 115  chest pain 130  Shoulder internal rotation     Shoulder external  rotation       (Blank rows = not tested)  CERVICAL AROM: All within functional limits: Limited 40-50% with Bilateral rotation and SB  UPPER EXTREMITY STRENGTH:   LYMPHEDEMA ASSESSMENTS:   SURGERY TYPE/DATE: Multiple surgeries 2006, 2013 2022, 2024 B.Mastectomies with reconstruction, Implants removed 2023 due to non healing wound,  NUMBER OF LYMPH NODES REMOVED: 6 right  CHEMOTHERAPY: YES, ongoing  RADIATION:YEs  HORMONE TREATMENT: Yes, Anastrozole   INFECTIONS: non healing wound requiring removal of implants    LYMPHEDEMA ASSESSMENTS:   LANDMARK RIGHT  eval  At axilla    15 cm proximal to olecranon process   10 cm proximal to olecranon process   Olecranon process   15 cm proximal to ulnar styloid process   10 cm proximal to ulnar styloid process   Just proximal to ulnar styloid process   Across hand at thumb web space   At base of 2nd digit   (Blank rows = not tested)  LANDMARK LEFT  eval  At axilla    15 cm proximal to olecranon process   10 cm proximal to olecranon process   Olecranon process   15 cm proximal to ulnar styloid process   10 cm proximal to ulnar styloid process   Just proximal to ulnar styloid process   Across hand at thumb web space   At base of 2nd digit   (  Blank rows = not tested)   FUNCTIONAL TESTS:    GAIT: WNL   QUICK DASH SURVEY: 36.36                                                                                                                            TREATMENT DATE: 06/24/2024 Ball rolls on wall x 5 Shoulder ranger x 1O B Jobes flexion and scaption  x 5 ea Supine wand flexion and scaption x 5 ea Snow angels x 5 STM to bilateral UT, Right pectorals and bilateral lateral trunk with cocoa butter, scar massage left mastectomy incision PROM bilateral shoulder flex, scaption, abd, IR and ER with VC's prn to relax   06/16/2024 Shoulder ranger x 10 B Jobes flexion and scaption x 10 with back against wall Reach to top shelf  with left hand x 7 Snow angels x 8 STM to bilateral UT, Right pectorals and bilateral lateral trunk with cocoa butter, scar massage left mastectomy incision PROM bilateral shoulder flex, scaption, abd, IR and ER with VC's prn to relax  06/09/2024  Ball rolls on wall x 5 flex and bilateral abd x 3 ea Jobes flexion and scaption x 5 ea Shoulder Ranger flexion x 10 ea Snow angels x 8 STM to bilateral UT, Right pectorals and bilateral lateral trunk with cocoa butter, scar massage left mastectomy incision PROM bilateral shoulder flex, scaption, abd, IR and ER with VC's prn to relax 05/27/2024 Scapular retraction,  shoulder extension and ER all bilateral with yellow x 12 ea Supine wand flexion and scaption x 3 ea Supine snow angel x 5 STM to bilateral UT, Right pectorals and bilateral lateral trunk with cocoa butter, scar massage left mastectomy incision PROM bilateral shoulder flex, scaption, abd, IR and ER with VC's prn to relax mainly on left 05/20/2024 Discussed improvements and deficits for PN Supine wand flexion and scaption x 4 ea PROM bilateral shoulder flexion scaption, abduction, IR and ER 3 D bilateral AROM flexion, scaption horizontal abd x 5 Snow angels x 5 B STM to bilateral UT, Right pectorals and bilateral lateral trunk with cocoa butter, scar massage left mastectomy incision PROM bilateral shoulder flex, scaption, abd, IR and ER with VC's prn to relax mainly on left  05/01/2024 STM to bilateral UT, Right pectorals and bilateral lateral trunk with cocoa butter, scar massage left mastectomy incision PROM bilateral shoulder flex, scaption, abd, IR and ER with VC's prn to relax mainly on left Supine wand flexion and scaption x 3, 5 sec hold with exhale at the end Star gazer x 5, 5 sec hold Single arm pec stretch at doorway x 3 ea, 20 sec scapular retraction, shoulder extension, and bilateral ER x 10 with yellow band  04/27/2024 Instructed in standing postural exercises;scapular  retraction, shoulder extension, and bilateral ER x 10 with yellow band Single arm pec stretch at doorway x 3 ea, 20 sec Supine wand flexion and scaption x 3, 5 sec hold with exhale at  the end Star gazer x 5, 5 sec hold STM to bilateral UT, Right pectorals and bilateral lateral trunk with cocoa butter, scar massage left mastectomy incision PROM bilateral shoulder flex, scaption, abd, IR and ER with VC's prn to relax mainly on left Updated HEP with band exs and advised to do 3x/week and not to the exclusion of her stretches   PATIENT EDUCATION:  Education details: supine wand exs for flexion and scaption x 5 ea and supine stargazer. Person educated: Patient Education method: Explanation, Demonstration, and Handouts Education comprehension: verbalized understanding and returned demonstration  HOME EXERCISE PROGRAM: supine wand exs for flexion and scaption x 5 ea and supine stargazer x 5 to perform 2x/day Standing postural exs with band, SR, ext , ER with yelow  ASSESSMENT:  CLINICAL IMPRESSION:  Fewer chest spasms occurring since episode last week. Pt visually maintaining good ROM. Should be ready for DC next week  OBJECTIVE IMPAIRMENTS: decreased activity tolerance, decreased knowledge of condition, decreased ROM, decreased strength, increased fascial restrictions, impaired flexibility, impaired UE functional use, postural dysfunction, and pain.   ACTIVITY LIMITATIONS: carrying, sleeping, dressing, reach over head, and hygiene/grooming  PARTICIPATION LIMITATIONS: cleaning, laundry, yard work, and activities requiring significant use of arms  PERSONAL FACTORS: 3+ comorbidities: Multiple breast surgeries, chemo, radiation, pacemaker swithced right to left are also affecting patient's functional outcome.   REHAB POTENTIAL: Good  CLINICAL DECISION MAKING: Evolving/moderate complexity  EVALUATION COMPLEXITY: Moderate  GOALS: Goals reviewed with patient? Yes  SHORT TERM GOALS: Target  date: 04/20/2024  Pt will be independent in a HEP for bilateral shoulder ROM/strength Baseline: Goal status: MET 04/20/2024  2.  Pt will have decreased chest/arm pain by 25% Baseline:  Goal status: MET 6/30 Left arm, Right chest, MET  Left chest  05/20/2024 3.  Pt will improve Shoulder AROM for Left flexion and  B abd  by 25-30 degrees Baseline:  Goal status: MET 04/20/2024  LONG TERM GOALS: Target date: 05/11/2024  Pt will have bilateral shoulder ROM for flexion and scaption to 125 degrees for improved reaching abiliy Baseline:  Goal status: MET 05/20/2024 2.  Pt will report decreased chest and left UE pain by 50% or greater Baseline:  Goal status: MET for LEFT UE, In progress for left chest pain  3.  Quick dash will improve to no greater than 20% to demonstrate improved function Baseline:  Goal status: In Progress 4.  Pt will have improved sleep by 50% or more to be more rested during the day Baseline:  Goal status: In Progress able to get slightly more comfortable , but takes multiple fluid pills   PLAN:  PT FREQUENCY: 1x/week  PT DURATION: 4-6 weeks  PLANNED INTERVENTIONS: 97164- PT Re-evaluation, 97750- Physical Performance Testing, 97110-Therapeutic exercises, 97530- Therapeutic activity, [id number]- Neuromuscular re-education, 97535- Self Care, [id number]- Manual therapy, 20560 (1-2 muscles), 20561 (3+ muscles)- Dry Needling, and Patient/Family education  PLAN FOR NEXT SESSION: DC next?,Review band,  Jobes exs,STM bilateral UT, axillary border of pecs, sternal border as appropriate,Careful on left due to Pacemaker. MFR to bilateral chest areas, Mastectomy scar massage,cupping,PROM, update HEP, Progress to band exercises.  Grayce JINNY Sheldon, PT 06/24/2024, 11:56 AM

## 2024-06-29 ENCOUNTER — Ambulatory Visit

## 2024-06-29 DIAGNOSIS — Z171 Estrogen receptor negative status [ER-]: Secondary | ICD-10-CM

## 2024-06-29 DIAGNOSIS — Z9013 Acquired absence of bilateral breasts and nipples: Secondary | ICD-10-CM

## 2024-06-29 DIAGNOSIS — M25612 Stiffness of left shoulder, not elsewhere classified: Secondary | ICD-10-CM

## 2024-06-29 DIAGNOSIS — R0789 Other chest pain: Secondary | ICD-10-CM

## 2024-06-29 DIAGNOSIS — R293 Abnormal posture: Secondary | ICD-10-CM

## 2024-06-29 DIAGNOSIS — M25512 Pain in left shoulder: Secondary | ICD-10-CM

## 2024-06-29 DIAGNOSIS — Z483 Aftercare following surgery for neoplasm: Secondary | ICD-10-CM

## 2024-06-29 DIAGNOSIS — M25611 Stiffness of right shoulder, not elsewhere classified: Secondary | ICD-10-CM

## 2024-06-29 NOTE — Therapy (Signed)
 OUTPATIENT PHYSICAL THERAPY  UPPER EXTREMITY ONCOLOGY TREATMENT  Patient Name: Jordan Willis MRN: [id number] DOB:08/28/1951, 73 y.o., female Today's Date: 06/29/2024  END OF SESSION:  PT End of Session - 06/29/24 0906     Visit Number 14    Number of Visits 15    Date for PT Re-Evaluation 07/01/24    Authorization Type none needed    PT Start Time 0906    PT Stop Time 0953    PT Time Calculation (min) 47 min    Activity Tolerance Patient tolerated treatment well    Behavior During Therapy [facility] for tasks assessed/performed           Past Medical History:  Diagnosis Date   AICD (automatic cardioverter/defibrillator) present    Anemia    Anginal pain (HCC)    Anxiety    Arthritis    Lumbar spine DDD.   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   Diabetes mellitus without complication (HCC)    Type II   Dyspnea    When patient gets sick uses Pro-Air inhaler   Frequent sinus infections    GERD (gastroesophageal reflux disease)    occ   Goiter    History of cardiomegaly    History of cataract    Bilateral   History of kidney stones    08/28/2018 currently has a large kidney stone, asymptomatic at this time   History of migraine    History of uterine fibroid    History of uterine prolapse    Hyperlipidemia    Hypertension    LBBB (left bundle branch block)    Menopause 03/03/2012   Nasal turbinate hypertrophy 11/29/2016   Left inferior    Personal history of chemotherapy    Personal history of radiation therapy    Pneumonia    as a child   PONV (postoperative nausea and vomiting)    Sinusitis 2014   Sleep apnea    No CPAP   SUI (stress urinary incontinence, female)    SVD (spontaneous vaginal delivery)    x 2   Thyroid  nodule    Past Surgical History:  Procedure Laterality  Date   ABDOMINAL HYSTERECTOMY     BILATERAL SALPINGOOPHORECTOMY Bilateral    BIV ICD INSERTION CRT-D N/A 08/24/2022   Procedure: BIV ICD INSERTION CRT-D;  Surgeon: Waddell Danelle ORN, MD;  Location: [facility] INVASIVE CV LAB;  Service: Cardiovascular;  Laterality: N/A;   BIV ICD INSERTION CRT-D N/A 11/07/2023   Procedure: BIV ICD INSERTION CRT-D;  Surgeon: Waddell Danelle ORN, MD;  Location: [facility] INVASIVE CV LAB;  Service: Cardiovascular;  Laterality: N/A;   BLADDER SUSPENSION N/A 05/16/2017   Procedure: TRANSVAGINAL TAPE (TVT) PROCEDURE;  Surgeon: Henry Slough, MD;  Location: [facility] ORS;  Service: Gynecology;  Laterality: N/A;   BREAST BIOPSY Right 2009   BREAST IMPLANT REMOVAL Bilateral 02/05/2022   Procedure: Removal bilateral breast implants;  Surgeon: Elisabeth Craig RAMAN, MD;  Location: [facility] OR;  Service: Plastics;  Laterality: Bilateral;   BREAST LUMPECTOMY Right 2009   BREAST LUMPECTOMY Left 2006   BREAST RECONSTRUCTION WITH PLACEMENT OF TISSUE EXPANDER AND FLEX HD (ACELLULAR HYDRATED DERMIS) Bilateral 09/04/2021   Procedure: BILATERAL BREAST RECONSTRUCTION WITH PLACEMENT OF TISSUE EXPANDER AND FLEX HD (ACELLULAR HYDRATED DERMIS);  Surgeon: Elisabeth Craig RAMAN,  MD;  Location: [facility] OR;  Service: Plastics;  Laterality: Bilateral;   CARDIAC CATHETERIZATION     CATARACT EXTRACTION Left 10/23/2017   CATARACT EXTRACTION Right 11/06/2017   COLONOSCOPY  10/22/2009   Normal.  Repeat 5 years.  [name]   COLONOSCOPY WITH PROPOFOL  N/A 12/21/2022   Procedure: COLONOSCOPY WITH PROPOFOL ;  Surgeon: Rollin Dover, MD;  Location: [facility]OSCOPY;  Service: Gastroenterology;  Laterality: N/A;   CYSTOSCOPY N/A 05/16/2017   Procedure: CYSTOSCOPY;  Surgeon: Henry Slough, MD;  Location: [facility] ORS;  Service: Gynecology;  Laterality: N/A;   DEBRIDEMENT AND CLOSURE WOUND Bilateral 10/03/2021   Procedure: Debridement bilateral mastectomy flaps;  Surgeon: Elisabeth Craig RAMAN, MD;  Location: [facility] OR;  Service: Plastics;  Laterality: Bilateral;  1.5 hour    EYE SURGERY     bilateral cataracts   LEAD EXTRACTION N/A 11/07/2023   Procedure: LEAD EXTRACTION;  Surgeon: Waddell Danelle ORN, MD;  Location: [facility] INVASIVE CV LAB;  Service: Cardiovascular;  Laterality: N/A;   LEFT HEART CATH AND CORONARY ANGIOGRAPHY N/A 02/09/2020   Procedure: LEFT HEART CATH AND CORONARY ANGIOGRAPHY;  Surgeon: Elmira Newman PARAS, MD;  Location: [facility] INVASIVE CV LAB;  Service: Cardiovascular;  Laterality: N/A;   LEFT HEART CATH AND CORONARY ANGIOGRAPHY N/A 03/27/2022   Procedure: LEFT HEART CATH AND CORONARY ANGIOGRAPHY;  Surgeon: Ladona Heinz, MD;  Location: [facility] INVASIVE CV LAB;  Service: Cardiovascular;  Laterality: N/A;   MASS EXCISION Right 12/04/2022   Procedure: EXCISION OF RIGHT CHEST WALL MASS;  Surgeon: Aron Shoulders, MD;  Location: [facility] OR;  Service: General;  Laterality: Right;   MASTECTOMY W/ SENTINEL NODE BIOPSY Right 09/04/2021   Procedure: BILATERAL MASTECTOMIES WITH RIGHT SENTINEL LYMPH NODE BIOPSY;  Surgeon: Aron Shoulders, MD;  Location: [facility] OR;  Service: General;  Laterality: Right;   REMOVAL OF BILATERAL TISSUE EXPANDERS WITH PLACEMENT OF BILATERAL BREAST IMPLANTS Bilateral 10/03/2021   Procedure: REMOVAL OF BILATERAL TISSUE EXPANDERS WITH REPLACEMENT OF TISSUE EXPANDERS;  Surgeon: Elisabeth Craig RAMAN, MD;  Location: [facility] OR;  Service: Plastics;  Laterality: Bilateral;   REMOVAL OF BILATERAL TISSUE EXPANDERS WITH PLACEMENT OF BILATERAL BREAST IMPLANTS Bilateral 12/19/2021   Procedure: REMOVAL OF BILATERAL TISSUE EXPANDERS WITH PLACEMENT OF BILATERAL BREAST IMPLANTS;  Surgeon: Elisabeth Craig RAMAN, MD;  Location: [facility] OR;  Service: Plastics;  Laterality: Bilateral;   ROBOTIC ASSISTED TOTAL HYSTERECTOMY N/A 05/16/2017   Procedure: ROBOTIC ASSISTED TOTAL HYSTERECTOMY;  Surgeon: Darcel Pool, MD;  Location: [facility] OR[facility];  Service: Gynecology;  Laterality: N/A;   ROTATOR CUFF REPAIR Bilateral    SCAR REVISION N/A 12/04/2022   Procedure: REVISION OF MASTECTOMY SCAR;  Surgeon: Aron Shoulders, MD;   Location: [facility] OR;  Service: General;  Laterality: N/A;   SHOULDER ARTHROSCOPY WITH ROTATOR CUFF REPAIR AND SUBACROMIAL DECOMPRESSION Right 09/03/2018   Procedure: Right shoulder manipulation under anesthesia, exam under anesthesia, mini open rotator cuff repair, subacromial decompression;  Surgeon: Duwayne Purchase, MD;  Location: [facility];  Service: Orthopedics;  Laterality: Right;  90 mins   TRANSESOPHAGEAL ECHOCARDIOGRAM (CATH LAB) N/A 11/07/2023   Procedure: TRANSESOPHAGEAL ECHOCARDIOGRAM;  Surgeon: Waddell Danelle ORN, MD;  Location: [facility] INVASIVE CV LAB;  Service: Cardiovascular;  Laterality: N/A;   TUBAL LIGATION     WISDOM TOOTH EXTRACTION     Patient Active Problem List   Diagnosis Date Noted   Chronic systolic heart failure (HCC) 11/07/2023   ICD (implantable cardioverter-defibrillator) in place 12/25/2022   Bradycardia 08/24/2022   BRCA2 gene mutation positive in female 07/30/2022   LBBB (left  bundle branch block)    Chronic diastolic heart failure (HCC)    NICM (nonischemic cardiomyopathy) (HCC)    Breast cancer of upper-outer quadrant of right female breast (HCC) 09/04/2021   Diabetic neuropathy (HCC) 02/08/2021   Angina pectoris (HCC) 02/09/2020   Abnormal stress test 02/09/2020   Complete rotator cuff tear 09/03/2018   Pelvic prolapse 05/16/2017   Intractable chronic post-traumatic headache 11/19/2016   Malignant neoplasm of upper-outer quadrant of left breast in female, estrogen receptor negative (HCC) 03/03/2012   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   Menopause 03/03/2012    PCP: Ryan Hives, MD  REFERRING PROVIDER: Mackey Chad, MD  REFERRING DIAG: s/p breast Cancer/ shoulder pain  THERAPY DIAG:  History of bilateral mastectomy  Malignant neoplasm of upper-outer quadrant of left breast in female, estrogen receptor negative (HCC)  Left shoulder pain, unspecified chronicity  Abnormal posture  Muscular chest pain  Aftercare following surgery  for neoplasm  Stiffness of left shoulder, not elsewhere classified  Stiffness of right shoulder, not elsewhere classified  ONSET DATE: 11/07/2023  Rationale for Evaluation and Treatment: Rehabilitation  SUBJECTIVE:                                                                                                                                                                                           SUBJECTIVE STATEMENT:  I can reach on top of my dresser and into cabinets. I can't reach above the mirror shelf with the left hand, but I can with the right hand. I can move in most directions now, and I can wash my back better.  Overall pain is 50% better, and I don't have the spasms. I am sleeping better overall.   PERTINENT HISTORY:   Pt was diagnosed with right breast cancer dx 05/2021. She has BRCA2 genetic mutation and has had left breast cancer in 2006 with chemo/breast conservation and XRT. She then had right breast cancer (dcis)  in 2009 with lumpectomy and radiation only. In 2022 she presented  with a palpable painful mass in the upper outer right breast. Dx imaging showed a 1.2 cm mass at 10 o'clock. 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  Pt underwent bilateral mastectomies with right sentinel node biopsy and expander reconstruction (Pace) 09/04/21. She had 6 negative nodes and negative margins. Patient ended up having the expanders removed 01/26/2022 because of continued drainage and discomfort.  2.14.2024 : revision of left mastectomy scar and removal of mass on right lateral mastectomy scar. Core needle biopsy was performed and this demonstrated invasive ductal carcinoma, grade 3. This was weakly ER positive,  PR negative, HER2 positive, Ki-67 70%. She had 11 cycles of Breast Maintenane Trastuzumab    Pt also has a Investment banker, corporate.  She has a decrease ejection fraction of 20- 30% .   PAIN:  Are you having pain? Yes NPRS scale: 4/10 present on left  chest and chest and  3/10 on right Pain location: bilateral chest greater on left and left upper arm Pain orientation: Bilateral  PAIN TYPE: aching, dull, sharp, and tight Pain description: constant on the left chest, but can be sharp,  Aggravating factors: sleeping on left, reaching Relieving factors: massaging, topical, ibuprofen   PRECAUTIONS: at risk for lymphedema , pacemaker, defibrillator, DM, Right RTC repair 2019, cardiomyopathy, LV EF 20-25%  RED FLAGS: None   WEIGHT BEARING RESTRICTIONS: No  FALLS:  Has patient fallen in last 6 months? No  LIVING ENVIRONMENT: Lives with: pts mom lives with her and her daughter temporarily Lives in: House/apartment  OCCUPATION: does not work  LEISURE: walks 3 days /week, can walk up to 2 miles with breaks  HAND DOMINANCE: right   PRIOR LEVEL OF FUNCTION: Independent  PATIENT GOALS: Decrease pain, improve ROM   OBJECTIVE: Note: Objective measures were completed at Evaluation unless otherwise noted.  COGNITION: Overall cognitive status: Within functional limits for tasks assessed   PALPATION: Tender bilateral UT, pectorals, incision areas from pacemaker B  OBSERVATIONS / OTHER ASSESSMENTS: Bilateral mastectomy incisions healed but very restricted, keloid scar right chest from pacemaker removal, and new keloid scar on left chest from new pacemaker.defibrillator placement.  SENSATION: Light touch: Deficits     POSTURE: forward head rounded shoulders   UPPER EXTREMITY AROM/PROM:  A/PROM RIGHT   eval  RIGHT 04/20/2024 RIGHT 05/20/2024 06/29/2024  Shoulder extension 48 51 53 63  Shoulder flexion 127(pulls chest) 146 146 tight  150  Shoulder abduction 110, pain pulling chest 146 150 157  Shoulder internal rotation      Shoulder external rotation        (Blank rows = not tested)  A/PROM LEFT   eval LEFT 04/20/2024 LEFT 05/20/2024 06/29/2024  Shoulder extension 40, tight 46 46 50  Shoulder flexion 65 (tight/pain chest) 102,  chest pain 140 tight, ant shoulder pain 147  Shoulder abduction 70 ( tight chest) 115  chest pain 130 147  Shoulder internal rotation      Shoulder external rotation        (Blank rows = not tested)  CERVICAL AROM: All within functional limits: Limited 40-50% with Bilateral rotation and SB  UPPER EXTREMITY STRENGTH:   LYMPHEDEMA ASSESSMENTS:   SURGERY TYPE/DATE: Multiple surgeries 2006, 2013 2022, 2024 B.Mastectomies with reconstruction, Implants removed 2023 due to non healing wound,  NUMBER OF LYMPH NODES REMOVED: 6 right  CHEMOTHERAPY: YES, ongoing  RADIATION:YEs  HORMONE TREATMENT: Yes, Anastrozole   INFECTIONS: non healing wound requiring removal of implants    LYMPHEDEMA ASSESSMENTS:   LANDMARK RIGHT  eval  At axilla    15 cm proximal to olecranon process   10 cm proximal to olecranon process   Olecranon process   15 cm proximal to ulnar styloid process   10 cm proximal to ulnar styloid process   Just proximal to ulnar styloid process   Across hand at thumb web space   At base of 2nd digit   (Blank rows = not tested)  LANDMARK LEFT  eval  At axilla    15 cm proximal to olecranon process   10 cm proximal to olecranon process   Olecranon process  15 cm proximal to ulnar styloid process   10 cm proximal to ulnar styloid process   Just proximal to ulnar styloid process   Across hand at thumb web space   At base of 2nd digit   (Blank rows = not tested)   FUNCTIONAL TESTS:    GAIT: WNL   QUICK DASH SURVEY: 36.36                                                                                                                            TREATMENT DATE: 06/29/2024 Discussed progress toward goals and quick dash Ball rolls x 5 forward, abd x 5 ea arm Shoulder ranger x 10 flexion B Supine wand flexion and scaption x 5 STM to bilateral UT, Right pectorals and bilateral lateral trunk with cocoa butter, scar massage left mastectomy incision PROM bilateral  shoulder flex, scaption, abd, IR and ER with VC's prn to relax Measured AROM and checked goals Discussed importance of pt continuing with her HEP/stretches to maintain good ROM and decreased pain  06/24/2024 Ball rolls on wall x 5 Shoulder ranger x 1O B Jobes flexion and scaption  x 5 ea Supine wand flexion and scaption x 5 ea Snow angels x 5 STM to bilateral UT, Right pectorals and bilateral lateral trunk with cocoa butter, scar massage left mastectomy incision PROM bilateral shoulder flex, scaption, abd, IR and ER with VC's prn to relax   06/16/2024 Shoulder ranger x 10 B Jobes flexion and scaption x 10 with back against wall Reach to top shelf with left hand x 7 Snow angels x 8 STM to bilateral UT, Right pectorals and bilateral lateral trunk with cocoa butter, scar massage left mastectomy incision PROM bilateral shoulder flex, scaption, abd, IR and ER with VC's prn to relax  06/09/2024  Ball rolls on wall x 5 flex and bilateral abd x 3 ea Jobes flexion and scaption x 5 ea Shoulder Ranger flexion x 10 ea Snow angels x 8 STM to bilateral UT, Right pectorals and bilateral lateral trunk with cocoa butter, scar massage left mastectomy incision PROM bilateral shoulder flex, scaption, abd, IR and ER with VC's prn to relax 05/27/2024 Scapular retraction,  shoulder extension and ER all bilateral with yellow x 12 ea Supine wand flexion and scaption x 3 ea Supine snow angel x 5 STM to bilateral UT, Right pectorals and bilateral lateral trunk with cocoa butter, scar massage left mastectomy incision PROM bilateral shoulder flex, scaption, abd, IR and ER with VC's prn to relax mainly on left  PATIENT EDUCATION:  Education details: supine wand exs for flexion and scaption x 5 ea and supine stargazer. Person educated: Patient Education method: Explanation, Demonstration, and Handouts Education comprehension: verbalized understanding and returned demonstration  HOME EXERCISE PROGRAM: supine  wand exs for flexion and scaption x 5 ea and supine stargazer x 5 to perform 2x/day Standing postural exs with band, SR, ext , ER with yelow  ASSESSMENT:  CLINICAL IMPRESSION:  Pt has achieved all short and long term goals established except for quick dash goal nearly achieved. She had excellent improvement in shoulder AROM, Decreased overall pain/spasm, and improved functional ability for all home activities. She has been educated to continue with her HEP to maintain her ROM and strength for her usual activities.She feels ready to be discharged to independent self management  OBJECTIVE IMPAIRMENTS: decreased activity tolerance, decreased knowledge of condition, decreased ROM, decreased strength, increased fascial restrictions, impaired flexibility, impaired UE functional use, postural dysfunction, and pain.   ACTIVITY LIMITATIONS: carrying, sleeping, dressing, reach over head, and hygiene/grooming  PARTICIPATION LIMITATIONS: cleaning, laundry, yard work, and activities requiring significant use of arms  PERSONAL FACTORS: 3+ comorbidities: Multiple breast surgeries, chemo, radiation, pacemaker swithced right to left are also affecting patient's functional outcome.   REHAB POTENTIAL: Good  CLINICAL DECISION MAKING: Evolving/moderate complexity  EVALUATION COMPLEXITY: Moderate  GOALS: Goals reviewed with patient? Yes  SHORT TERM GOALS: Target date: 04/20/2024  Pt will be independent in a HEP for bilateral shoulder ROM/strength Baseline: Goal status: MET 04/20/2024  2.  Pt will have decreased chest/arm pain by 25% Baseline:  Goal status: MET 6/30 Left arm, Right chest, MET  Left chest  05/20/2024 3.  Pt will improve Shoulder AROM for Left flexion and  B abd  by 25-30 degrees Baseline:  Goal status: MET 04/20/2024  LONG TERM GOALS: Target date: 05/11/2024  Pt will have bilateral shoulder ROM for flexion and scaption to 125 degrees for improved reaching abiliy Baseline:  Goal status:  MET 05/20/2024 2.  Pt will report decreased chest and left UE pain by 50% or greater Baseline:  Goal status: MET for LEFT UE, In progress for left chest pain  3.  Quick dash will improve to no greater than 20% to demonstrate improved function Baseline:  Goal status: NOT MET, but nearly 22.7% 4.  Pt will have improved sleep by 50% or more to be more rested during the day Baseline:  Goal status: MET 06/29/2024, but still has to get up to use the bathroom due to fluid pills  PLAN:  PT FREQUENCY: 1x/week  PT DURATION: 4-6 weeks  PLANNED INTERVENTIONS: 02835- PT Re-evaluation, 97750- Physical Performance Testing, 97110-Therapeutic exercises, 97530- Therapeutic activity, 97112- Neuromuscular re-education, 97535- Self Care, 02859- Manual therapy, 20560 (1-2 muscles), 20561 (3+ muscles)- Dry Needling, and Patient/Family education  PLAN FOR NEXT SESSION: DC to independent self managment PHYSICAL THERAPY DISCHARGE SUMMARY  Visits from Start of Care: 14  Current functional level related to goals / functional outcomes: Achieved most goals established   Remaining deficits: Did not achieve quick dash goal of 20 %, was 22.75% today, continues with some mild pain that is intermittent   Education / Equipment: HEP, theraband   Patient agrees to discharge. Patient goals were Met except for quick dash. Patient is being discharged due to being pleased with the current functional level.   Grayce JINNY Sheldon, PT 06/29/2024, 9:57 AM

## 2024-06-30 DIAGNOSIS — C50412 Malignant neoplasm of upper-outer quadrant of left female breast: Secondary | ICD-10-CM | POA: Diagnosis not present

## 2024-06-30 DIAGNOSIS — Z171 Estrogen receptor negative status [ER-]: Secondary | ICD-10-CM | POA: Diagnosis not present

## 2024-07-01 DIAGNOSIS — R0981 Nasal congestion: Secondary | ICD-10-CM | POA: Diagnosis not present

## 2024-07-01 DIAGNOSIS — R051 Acute cough: Secondary | ICD-10-CM | POA: Diagnosis not present

## 2024-07-01 DIAGNOSIS — J019 Acute sinusitis, unspecified: Secondary | ICD-10-CM | POA: Diagnosis not present

## 2024-07-06 ENCOUNTER — Inpatient Hospital Stay: Attending: Hematology and Oncology | Admitting: Hematology and Oncology

## 2024-07-06 DIAGNOSIS — Z171 Estrogen receptor negative status [ER-]: Secondary | ICD-10-CM

## 2024-07-06 DIAGNOSIS — C50412 Malignant neoplasm of upper-outer quadrant of left female breast: Secondary | ICD-10-CM | POA: Diagnosis not present

## 2024-07-06 MED ORDER — ANASTROZOLE 1 MG PO TABS: 1.0000 mg | ORAL_TABLET | Freq: Every day | ORAL | 3 refills | Status: DC

## 2024-07-06 NOTE — Progress Notes (Signed)
 HEMATOLOGY-ONCOLOGY TELEPHONE VISIT PROGRESS NOTE  I connected with our patient on 07/06/24 at 11:45 AM EDT by telephone and verified that I am speaking with the correct person using two identifiers.  I discussed the limitations, risks, security and privacy concerns of performing an evaluation and management service by telephone and the availability of in person appointments.  I also discussed with the patient that there may be a patient responsible charge related to this service. The patient expressed understanding and agreed to proceed.   History of Present Illness: Follow-up to discuss antiestrogen treatment plan  History of Present Illness Jordan Willis is a 73 year old female with breast cancer who presents for follow-up regarding anti-estrogen therapy.  She has discontinued anti-estrogen medication for three months, resulting in significant improvement in migraine headaches. Muscle aches and pains have also improved, which she attributes to physical therapy. Her medication history includes exemestane , letrozole , tamoxifen, and anastrozole , with difficulty recalling all medications due to being on and off various treatments. She awaits the results of a recent Signatera lab test drawn on September 2nd, which is still pending.    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%, PR 0%, HER-2 negative, Ki-67 31%   09/26/2005 - 11/13/2005 Radiation Therapy   Adjuvant radiation therapy    03/18/2008 Initial Biopsy   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   03/22/2008 Surgery   Right breast lumpectomy 0.9 cm DCIS ER 90% PR 98% stage 0   04/11/2008 Procedure   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   05/10/2008 - 07/02/2008 Radiation Therapy   Adjuvant radiation therapy   07/17/2008 - 04/16/2009 Anti-estrogen oral therapy   Femara  then switched to tamoxifen which was discontinued in 2010 due to concern about uterine cancer   05/31/2021 Cancer Staging   Staging form: Breast, AJCC 7th Edition - Clinical: Stage IA (rT1c, N0, M0) - Signed by Odean Potts, MD on 05/31/2021 Stage prefix: Recurrence Laterality: Right Biopsy of metastatic site performed: No Estrogen receptor status: Positive Progesterone receptor status: Negative HER2 status: Positive   09/12/2021 Cancer Staging   Staging form: Breast, AJCC 7th Edition - Pathologic: Stage IIA (T2, N0, cM0) - Signed by Odean Potts, MD on 09/12/2021 Estrogen receptor status: Positive Progesterone receptor status: Positive HER2 status: Negative   10/05/2022 Relapse/Recurrence   Palpable lump in the right mastectomy scar: 1.4 x 1.3 x 1.3 cm and a 0.4 cm mass (presence of small residual fluid): Biopsy revealed grade 3 IDC with necrosis ER 90% PR 0% Ki-67 70%, HER2 3+ positive   11/06/2022 Imaging   CT chest/abdomen/pelvis Developing small nodule along the right chest wall lateral to the pectoralis muscle inferior to the battery pack of the patient's defibrillator. A malignant lesion is possible. Recommend further workup such as ultrasound.   No developing other new mass lesion, fluid collection or lymph node enlargement.   Persistent sclerosis diffusely along the sternum.  Fatty liver infiltration.   Left-sided renal stone   Aortic atherosclerosis.  ICD 10 code-I70.0    11/06/2022 Imaging   Intense sternal/manubrial radiotracer uptake  corresponding with the expansile lucency and sclerosis seen on CT is compatible with osseous metastatic disease.   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 ER 90%, PR 0%, Ki-67 70%, HER2 3+ positive    02/06/2023 -  Chemotherapy   Patient is on Treatment Plan : BREAST MAINTENANCE Trastuzumab  IV (6) or SQ (600) D1 q21d X 11 Cycles     Breast cancer of upper-outer quadrant of right female breast (HCC)  05/23/2021 Relapse/Recurrence   Screening detected right breast mass at 10 o'clock position measuring 1.2 cm, ultrasound-guided biopsy revealed grade 3 IDC ER 20%, PR 0%, HER2 positive 3+, Ki-67 50%   09/04/2021 Surgery   Bilateral mastectomies Left mastectomy: Benign Right mastectomy: Grade 3 IDC with DCIS 3.6 cm, 0/6 lymph nodes negative, ER 20%, PR 0%, HER2 positive by IHC, Ki-67 50%   11/21/2021 - 01/23/2022 Chemotherapy   Patient is on Treatment Plan : BREAST Trastuzumab  q21d     04/2022 -  Anti-estrogen oral therapy   Anastrozole  daily; changed to Exemestane      REVIEW OF SYSTEMS:   Constitutional: Denies fevers, chills or abnormal weight loss All other systems were reviewed with the patient and are negative. Observations/Objective:     Assessment Plan:  Malignant neoplasm of upper-outer quadrant of left breast in female, estrogen receptor negative  (HCC) 12/04/2022: Right breast mastectomy scar with chest wall mass 3 exertion: Grade 3 poorly differentiated ductal adenocarcinoma with extensive tumor necrosis, margins negative involving subcutis and deep dermis, vascular invasion present.  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  Cycle 8 Herceptin  (because of cardiomyopathy, Dr. Cherrie is watching very closely) completed 07/04/2023 (stopped because of cardiac dysfunction EF 20 to 25%)   Signatera: 01/02/2023: Positive 0.07 MTM/ml Signatera 04/17/2023: Negative Signatera 07/06/2023: Negative Signatera March 2025: Negative Signatera September 2025: Pending   Bone scan 08/06/2023: Increased uptake within the sternum and manubrium similar to before.   Diffuse muscle aches and pains: Marked improvement with physical therapy Migraines: Marked improvement since exemestane  was discontinued  Recommended the patient take anastrozole .  I sent in new prescription. Telephone visit in 1 month to discuss tolerance to anastrozole  Assessment & Plan Estrogen receptor  negative malignant neoplasm of upper-outer quadrant of left breast She  discontinued exemestane . Anastrozole  suggested for anti-estrogen therapy. - Prescribe anastrozole  and start immediately. - Call in one month to assess tolerance and side effects. - Send prescription to [facility] on [address] [place].      I discussed the assessment and treatment plan with the patient. The patient was provided an opportunity to ask questions and all were answered. The patient agreed with the plan and demonstrated an understanding of the instructions. The patient was advised to call back or seek an in-person evaluation if the symptoms worsen or if the condition fails to improve as anticipated.   I provided 20 minutes of non-face-to-face time during this encounter.  This includes time for charting and coordination of care   Naomi MARLA Chad, MD

## 2024-07-06 NOTE — Assessment & Plan Note (Signed)
 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  Cycle 8 Herceptin  (because of cardiomyopathy, Dr. Cherrie is watching very closely) completed 07/04/2023 (stopped because of cardiac dysfunction EF 20 to 25%)   Signatera: 01/02/2023: Positive 0.07 MTM/ml Signatera 04/17/2023: Negative Signatera 07/06/2023: Negative Signatera March 2025: Negative   Bone scan 08/06/2023: Increased uptake within the sternum and manubrium similar to before.   Diffuse muscle aches and pains: I recommended that she discontinue anastrozole  for 3 months.  If her symptoms resolve then we can talk about switching her to letrozole .  If her symptoms do not resolve then she will restart it. I sent a referral to physical therapy for dry needling.

## 2024-07-08 DIAGNOSIS — C50911 Malignant neoplasm of unspecified site of right female breast: Secondary | ICD-10-CM | POA: Diagnosis not present

## 2024-07-08 DIAGNOSIS — C50912 Malignant neoplasm of unspecified site of left female breast: Secondary | ICD-10-CM | POA: Diagnosis not present

## 2024-07-09 LAB — SIGNATERA ONLY (NATERA MANAGED)
SIGNATERA MTM READOUT: 0 MTM/ml
SIGNATERA TEST RESULT: NEGATIVE

## 2024-07-16 ENCOUNTER — Other Ambulatory Visit: Payer: Self-pay | Admitting: Hematology and Oncology

## 2024-07-17 ENCOUNTER — Encounter: Payer: Self-pay | Admitting: Hematology and Oncology

## 2024-07-24 ENCOUNTER — Ambulatory Visit: Attending: Cardiology | Admitting: Cardiology

## 2024-07-24 ENCOUNTER — Encounter: Payer: Self-pay | Admitting: Cardiology

## 2024-07-24 ENCOUNTER — Telehealth: Payer: Self-pay | Admitting: *Deleted

## 2024-07-24 VITALS — BP 132/60 | HR 83 | Resp 17 | Ht 67.0 in | Wt 174.0 lb

## 2024-07-24 DIAGNOSIS — E782 Mixed hyperlipidemia: Secondary | ICD-10-CM | POA: Diagnosis not present

## 2024-07-24 DIAGNOSIS — I428 Other cardiomyopathies: Secondary | ICD-10-CM

## 2024-07-24 DIAGNOSIS — Z171 Estrogen receptor negative status [ER-]: Secondary | ICD-10-CM

## 2024-07-24 DIAGNOSIS — E119 Type 2 diabetes mellitus without complications: Secondary | ICD-10-CM

## 2024-07-24 DIAGNOSIS — I10 Essential (primary) hypertension: Secondary | ICD-10-CM

## 2024-07-24 DIAGNOSIS — Z95810 Presence of automatic (implantable) cardiac defibrillator: Secondary | ICD-10-CM | POA: Diagnosis not present

## 2024-07-24 DIAGNOSIS — C50412 Malignant neoplasm of upper-outer quadrant of left female breast: Secondary | ICD-10-CM

## 2024-07-24 DIAGNOSIS — I5022 Chronic systolic (congestive) heart failure: Secondary | ICD-10-CM

## 2024-07-24 DIAGNOSIS — I447 Left bundle-branch block, unspecified: Secondary | ICD-10-CM | POA: Diagnosis not present

## 2024-07-24 NOTE — Patient Instructions (Signed)
 Medication Instructions:  Your physician recommends that you continue on your current medications as directed. Please refer to the Current Medication list given to you today.  *If you need a refill on your cardiac medications before your next appointment, please call your pharmacy*  Lab Work: None ordered  If you have labs (blood work) drawn today and your tests are completely normal, you will receive your results only by: MyChart Message (if you have MyChart) OR A paper copy in the mail If you have any lab test that is abnormal or we need to change your treatment, we will call you to review the results.  Testing/Procedures: Your physician has requested that you have an echocardiogram. Echocardiography is a painless test that uses sound waves to create images of your heart. It provides your doctor with information about the size and shape of your heart and how well your heart's chambers and valves are working. This procedure takes approximately one hour. There are no restrictions for this procedure. Please do NOT wear cologne, perfume, aftershave, or lotions (deodorant is allowed). Please arrive 15 minutes prior to your appointment time.  Please note: We ask at that you not bring children with you during ultrasound (echo/ vascular) testing. Due to room size and safety concerns, children are not allowed in the ultrasound rooms during exams. Our front office staff cannot provide observation of children in our lobby area while testing is being conducted. An adult accompanying a patient to their appointment will only be allowed in the ultrasound room at the discretion of the ultrasound technician under special circumstances. We apologize for any inconvenience.   Follow-Up: At [facility], you and your health needs are our priority.  As part of our continuing mission to provide you with exceptional heart care, our providers are all part of one team.  This team includes your primary  Cardiologist (physician) and Advanced Practice Providers or APPs (Physician Assistants and Nurse Practitioners) who all work together to provide you with the care you need, when you need it.  Your next appointment:   6 month(s)  Provider:   Madonna Large, DO

## 2024-07-24 NOTE — Progress Notes (Signed)
 Cardiology Office Note:  .   Date:  07/24/2024  ID:  Jordan Willis, DOB [birth year], MRN [id number] PCP:  Clarice Nottingham, MD  Former Cardiology Providers: Emmalene Thelbert Lawrence, MSN, APRN, FNP-C  [facility] Providers Cardiologist:  Madonna Large, DO, [facility] (established care 12/2019)  Electrophysiologist:  Danelle Birmingham, MD  Advanced Heart Failure:  Toribio Fuel, MD  Electrophysiologist:  Danelle Birmingham, MD  Click to update primary MD,subspecialty MD or APP then REFRESH:1}    Chief Complaint  Patient presents with   Chronic systolic heart failure [facility])   Follow-up    6 month    History of Present Illness: .   Jordan Willis is a 73 y.o. African-American female whose past medical history and cardiovascular risk factors includes: Left bundle branch block, type 2 diabetes mellitus, hyperlipidemia, nonischemic cardiomyopathy, chronic combined systolic/diastolic heart failure, recurrent breast cancer status post bilateral mastectomies & herceptin , postmenopausal female, advanced age.   Patient has known history of nonischemic cardiomyopathy.  Her GDMT has been uptitrated to the maximally tolerated doses.  She is also been evaluated by EP and underwent BiV ICD implant in November 2023.  Her coronary sinus did not have any acceptable vein for BiV pacing and her left bundle area pacing was initially good but currently was only capturing the septum and not effectively providing BiV pacing.    Since the last visit she did undergo upgrade of the BiV ICD (left sided) on 11/07/2023 and QRS decreased from 170 msec to 130 msec w/ left bundle pacing.   Patient denies anginal chest pain or heart failure symptoms. She endorses symptoms consistent with NYHA class II symptoms on current medical therapy. Remains on GDMT as well as diuretics.  She is euvolemic on physical examination. Patient wishes to come off of some of her GDMT medications due to overall pill burden. She has healed well up after her  left-sided BiV ICD implant but at times still appreciates discomfort at the incision site.  Review of Systems: .   Review of Systems  Cardiovascular:  Negative for chest pain, claudication, irregular heartbeat, leg swelling, near-syncope, orthopnea, palpitations, paroxysmal nocturnal dyspnea and syncope.  Respiratory:  Negative for shortness of breath.   Hematologic/Lymphatic: Negative for bleeding problem.    Studies Reviewed:   EKG: April 2025: Atrial sensed ventricular paced rhythm.  QRS duration has decreased when compared to prior tracings  Echocardiogram: 03/29/2021:  50-55%, G1DD, moderate MR, Moderate TR, PASP .    06/15/2021: LVEF 30-35%, severe hypokinesis of the anteroseptal and apex, moderate to severe LV dysfunction, grade 1 diastolic dysfunction, elevated LAP, global longitudinal strain -16.7%, mild to moderate Mr.    10/09/2021: 45-50%, no regional wall motion abnormalities, global longitudinal strain -13.2%, mild MR.    01/29/2022: 35-40%, average GLS -10.3%, grade 1 diastolic impairment, severe hypokinesis of the left ventricle with regional wall motion abnormalities, moderately dilated left atrium, posterior pericardial effusion without tamponade, moderate to severe MR, estimated RAP 8 mmHg.   10/29/2022 EF 30-35% RV low normal  September 2024: LVEF 20-25%, septal motion consistent with LBBB, right ventricular systolic function normal & size normal estimated RAP 3 mmHg, mild to moderate MR   Stress Testing: 02/14/2018 exercise nuclear stress test at [facility]: Achieved 7 METS, normal blood pressure response to exercise, had exertional chest pain that improved/resolved during recovery, normal myocardial perfusion study no EKG abnormalities per report.   Heart Catheterization: Left Heart Catheterization 03/27/22: No CAD EF 30-35%   MUGA scan 05/10/2022:  1. Left ventricular ejection fraction is equal to 35.3%. 2. Moderate to marked septal  hypokinesis. 3. Left ventricular dilatation.  RADIOLOGY: NA  Risk Assessment/Calculations:   NA  Labs:       Latest Ref Rng & Units 03/25/2024    1:46 PM 11/07/2023    8:14 AM 11/07/2023    8:11 AM  CBC  WBC 4.0 - 10.5 K/uL 5.7     Hemoglobin 12.0 - 15.0 g/dL 88.4  87.3  87.7   Hematocrit 36.0 - 46.0 % 34.2  37.0  36.0   Platelets 150 - 400 K/uL 277          Latest Ref Rng & Units 03/25/2024    1:46 PM 11/29/2023    2:52 PM 11/07/2023    8:14 AM  BMP  Glucose 70 - 99 mg/dL 91  852    BUN 8 - 23 mg/dL 18  39    Creatinine 9.55 - 1.00 mg/dL 9.11  8.92    BUN/Creat Ratio 12 - 28  36    Sodium 135 - 145 mmol/L 135  136  140   Potassium 3.5 - 5.1 mmol/L 3.6  3.3  3.9   Chloride 98 - 111 mmol/L 95  92    CO2 22 - 32 mmol/L 31  24    Calcium  8.9 - 10.3 mg/dL 89.0  89.4        Latest Ref Rng & Units 03/25/2024    1:46 PM 11/29/2023    2:52 PM 11/07/2023    8:14 AM  CMP  Glucose 70 - 99 mg/dL 91  852    BUN 8 - 23 mg/dL 18  39    Creatinine 9.55 - 1.00 mg/dL 9.11  8.92    Sodium 864 - 145 mmol/L 135  136  140   Potassium 3.5 - 5.1 mmol/L 3.6  3.3  3.9   Chloride 98 - 111 mmol/L 95  92    CO2 22 - 32 mmol/L 31  24    Calcium  8.9 - 10.3 mg/dL 89.0  89.4    Total Protein 6.5 - 8.1 g/dL 8.7     Total Bilirubin 0.0 - 1.2 mg/dL 0.5     Alkaline Phos 38 - 126 U/L 93     AST 15 - 41 U/L 23     ALT 0 - 44 U/L 22       Lab Results  Component Value Date   CHOL 158 10/13/2020   HDL 44 10/13/2020   LDLCALC 94 10/13/2020   TRIG 107 10/13/2020   CHOLHDL 6.2 10/11/2013   No results for input(s): LIPOA in the last 8760 hours. No components found for: NTPROBNP Recent Labs    11/29/23 1451  PROBNP 194   Recent Labs    02/26/24 0000  TSH 0.80   External Labs: Collected:02/26/2024 TSH 0.8 A1c 7.2 BUN 21, creatinine 0.92. eGFR 66. Potassium 3.6 AST, ALT, alkaline phosphatase within normal limits Hemoglobin 12.7 Total cholesterol 174, triglycerides 88, HDL 48, LDL  calculated 110  Physical Exam:    Today's Vitals   07/24/24 1135  BP: 132/60  Pulse: 83  Resp: 17  SpO2: 99%  Weight: 174 lb (78.9 kg)  Height: 5' 7 (1.702 m)   Body mass index is 27.25 kg/m. Wt Readings from Last 3 Encounters:  07/24/24 174 lb (78.9 kg)  03/25/24 176 lb 4.8 oz (80 kg)  02/11/24 180 lb 6.4 oz (81.8 kg)    Physical Exam  Constitutional: No  distress.  Age appropriate, hemodynamically stable.   Neck: No JVD present.  Cardiovascular: Normal rate, regular rhythm, S1 normal, S2 normal, intact distal pulses and normal pulses. Exam reveals no gallop, no S3 and no S4.  No murmur heard. Pulmonary/Chest: Effort normal and breath sounds normal. No stridor. She has no wheezes. She has no rales.  Bilateral mastectomies.  Left-sided cardiac device implant site is clean dry and intact.  Abdominal: Soft. Bowel sounds are normal. She exhibits no distension. There is no abdominal tenderness.  Musculoskeletal:        General: No edema.     Cervical back: Neck supple.  Neurological: She is alert and oriented to person, place, and time. She has intact cranial nerves (2-12).  Skin: Skin is warm and moist.     Impression & Recommendation(s):  Impression:   ICD-10-CM   1. NICM (nonischemic cardiomyopathy) (HCC)  I42.8 ECHOCARDIOGRAM COMPLETE    2. Chronic systolic congestive heart failure (HCC)  I50.22 ECHOCARDIOGRAM COMPLETE    3. Biventricular implantable cardioverter-defibrillator in situ  Z95.810     4. LBBB (left bundle branch block)  I44.7     5. Malignant neoplasm of upper-outer quadrant of left breast in female, estrogen receptor negative (HCC)  C50.412    Z17.1     6. Essential hypertension  I10     7. Mixed hyperlipidemia  E78.2     8. Non-insulin  dependent type 2 diabetes mellitus (HCC)  E11.9         Recommendation(s):  Chronic systolic heart failure (HCC) NICM (nonischemic cardiomyopathy) (HCC) LBBB (left bundle branch block) ICD (implantable  cardioverter-defibrillator) in place Stage B, NYHA class II. Echocardiogram September 2024: LVEF 20-25%, see report for details Status post left-sided [employer] Jude BiV ICD-with reduction in QRS duration Most recent device interrogation report reviewed.  No reoccurrence of A-fib for now.  Will continue to monitor.  Device site has healed well. Euvolemic on physical examination. Continue Entresto  24/26 mg p.o. twice daily. Continue spironolactone  25 mg p.o. daily. Continue Zaroxolyn  2.5 mg p.o. q. other day Continue Toprol -XL25 mg p.o. daily. Continue Farxiga  10 mg p.o. daily. Continue Bumex1 mg p.o. daily Currently on K-Lor 20 mEq 2 tabs daily Clinically she has improved significantly since last office visit, maintaining a nice blood pressure, and her QRS remains to be narrowed.  I am very hopeful that her LVEF is improved.  Will update her echo. Still recommended uptitration of GDMT since her blood pressure is now able to tolerate it; however, patient remains hesitant as she is trying to come off of medications.  Shared decision was to await results of the echocardiogram and if there is room for EF to improve patient is willing to increase medical therapy otherwise she would like to continue the same. Independently reviewed labs from June 2025  Malignant neoplasm of upper-outer quadrant of left breast in female, estrogen receptor negative (HCC) Follows with Dr. Odean. Follows with Dr. Currently on anastrozole  and exemestane   Essential hypertension Office blood pressures are very well-controlled. Medications as discussed above  Hyperlipidemia: Continue Crestor 20 mg p.o. daily. LDL 110 mg/dL, May 2025 Recommend a goal LDL <70 mg/dL. Cardiology following peripherally, managed by primary care provider.  Discussed management of at least 2 chronic comorbid conditions. Reviewed her most recent BiV ICD implant op report as well as recent transmission reports. Outside labs from 02/2024  independently reviewed, provided by PCP. Discussed uptitration of GDMT. Echocardiogram to reevaluate LVEF. Prescription drug management. Plan of care discussed with  patient and daughter at today's visit, questions  Orders Placed:  Orders Placed This Encounter  Procedures   ECHOCARDIOGRAM COMPLETE    Standing Status:   Future    Expiration Date:   07/24/2025    Where [facility] performed[facility]    Does the patient weigh less than or greater than 250 lbs?:   Patient weighs less than 250 lbs    Perflutren  DEFINITY  (image enhancing agent) should be administered unless hypersensitivity or allergy exist:   Administer Perflutren     Reason for exam-Echo:   Other-Full Diagnosis List    Full ICD-10/Reason for Exam:   Non-ischemic cardiomyopathy (HCC) [650707]    Full ICD-10/Reason for Exam:   Heart failure [facility]) [[id number]]    Final Medication List:   No orders of the defined types were placed in this encounter.   There are no discontinued medications.   Current Outpatient Medications:    anastrozole  (ARIMIDEX ) 1 MG tablet, Take 1 tablet (1 mg total) by mouth daily., Disp: 90 tablet, Rfl: 3   baclofen (LIORESAL) 10 MG tablet, Take 10 mg by mouth daily as needed for muscle spasms., Disp: , Rfl:    bumetanide  (BUMEX ) 1 MG tablet, TAKE 1 TABLET BY MOUTH EVERY DAY, Disp: 90 tablet, Rfl: 2   cetirizine (ZYRTEC) 10 MG tablet, Take 10 mg by mouth in the morning., Disp: , Rfl:    Cholecalciferol (VITAMIN D3) 25 MCG (1000 UT) CAPS, Take 1,000 Units by mouth in the morning., Disp: , Rfl:    cyanocobalamin (VITAMIN B12) 1000 MCG tablet, Take 1,000 mcg by mouth daily., Disp: , Rfl:    dapagliflozin  propanediol (FARXIGA ) 10 MG TABS tablet, TAKE 1 TABLET BY MOUTH EVERY DAY BEFORE BREAKFAST, Disp: 90 tablet, Rfl: 3   diclofenac Sodium (VOLTAREN) 1 % GEL, Apply 1 Application topically 4 (four) times daily as needed (pain)., Disp: , Rfl:    exemestane  (AROMASIN ) 25 MG tablet, TAKE 1  TABLET (25 MG TOTAL) BY MOUTH DAILY AFTER BREAKFAST., Disp: 90 tablet, Rfl: 3   ezetimibe (ZETIA) 10 MG tablet, Take 10 mg by mouth daily., Disp: , Rfl:    fluticasone  (FLONASE ) 50 MCG/ACT nasal spray, Place 1 spray into both nostrils in the morning., Disp: , Rfl:    GLUCOSAMINE-CHONDROITIN PO, Take 1 tablet by mouth every evening. , Disp: , Rfl:    ibuprofen  (ADVIL ) 200 MG tablet, Take 400 mg by mouth every 6 (six) hours as needed for mild pain or moderate pain., Disp: , Rfl:    metFORMIN  (GLUCOPHAGE -XR) 500 MG 24 hr tablet, Take 500 mg by mouth 2 (two) times daily. (Patient taking differently: Take 500 mg by mouth daily with breakfast.), Disp: , Rfl:    metolazone  (ZAROXOLYN ) 2.5 MG tablet, Take 1 tablet (2.5 mg total) by mouth every other day. 30 minutes prior to taking your Bumex , Disp: 90 tablet, Rfl: 3   metoprolol  succinate (TOPROL -XL) 25 MG 24 hr tablet, TAKE 1 TABLET (25 MG TOTAL) BY MOUTH IN THE MORNING. HOLD IF SYSTOLIC BLOOD PRESSURE (TOP BLOOD PRESSURE NUMBER) LESS THAN 100 MMHG OR HEART RATE LESS THAN 60 BPM (PULSE)., Disp: 90 tablet, Rfl: 1   Multiple Vitamin (MULTIVITAMIN WITH MINERALS) TABS tablet, Take 1 tablet by mouth daily. Complete, Disp: , Rfl:    nitroGLYCERIN  (NITROSTAT ) 0.4 MG SL tablet, Place 0.4 mg under the tongue every 5 (five) minutes as needed for chest pain., Disp: , Rfl:    Olopatadine  HCl 0.2 % SOLN,  Place 1 drop into both eyes daily as needed (allergy). [place] , Disp: , Rfl:    ondansetron  (ZOFRAN ) 4 MG tablet, Take 1 tablet (4 mg total) by mouth every 6 (six) hours as needed for nausea or vomiting., Disp: 20 tablet, Rfl: 0   pantoprazole (PROTONIX) 40 MG tablet, Take 40 mg by mouth daily as needed (Stomach problem)., Disp: , Rfl:    potassium chloride  SA (KLOR-CON  M) 20 MEQ tablet, TAKE 2 TABLETS BY MOUTH DAILY, Disp: 180 tablet, Rfl: 2   Propylene Glycol (SYSTANE BALANCE OP), Place 1 drop into both eyes 2 (two) times daily., Disp: , Rfl:    rosuvastatin (CRESTOR)  20 MG tablet, Take 20 mg by mouth at bedtime., Disp: , Rfl:    sacubitril -valsartan  (ENTRESTO ) 24-26 MG, Take 1 tablet by mouth 2 (two) times daily., Disp: 60 tablet, Rfl: 11   sertraline  (ZOLOFT ) 50 MG tablet, Take 50 mg by mouth in the morning., Disp: , Rfl:    spironolactone  (ALDACTONE ) 25 MG tablet, TAKE 1 TABLET BY MOUTH EVERY DAY IN THE MORNING, Disp: 90 tablet, Rfl: 3   ZINC OXIDE-VITAMIN C PO, Take 1 tablet by mouth daily. With vit D, Disp: , Rfl:    albuterol  (VENTOLIN  HFA) 108 (90 Base) MCG/ACT inhaler, Inhale 2 puffs into the lungs every 6 (six) hours as needed for wheezing or shortness of breath., Disp: , Rfl:    ALOMIDE 0.1 % ophthalmic solution, SMARTSIG:1-2 Drop(s) In Eye(s) 4 Times Daily, Disp: , Rfl:    glucose blood (ONETOUCH VERIO) test strip, TEST ONCE DAILY AS DIRECTED 90, Disp: , Rfl:    OneTouch Delica Lancets 33G MISC, Apply topically., Disp: , Rfl:   Consent:   None  Disposition:   6 months sooner if needed.   Her questions and concerns were addressed to her satisfaction. She voices understanding of the recommendations provided during this encounter.    Signed, Madonna Michele HAS, [facility]  A Division of [facility] [address] [place], [place] [address]

## 2024-07-24 NOTE — Progress Notes (Signed)
 Remote ICD Transmission

## 2024-07-24 NOTE — Telephone Encounter (Signed)
 Received call from pt with complaint of severe nausea and several episodes of vomiting since starting Anastrozole .  Per MD pt needing to hold Anastrozole  and f/u in office in 1 week.  Pt states due to transportation issues, appt will need to be a telephone visit.  Appt scheduled, pt verbalized understanding.

## 2024-07-30 ENCOUNTER — Inpatient Hospital Stay: Attending: Hematology and Oncology | Admitting: Hematology and Oncology

## 2024-07-30 DIAGNOSIS — Z171 Estrogen receptor negative status [ER-]: Secondary | ICD-10-CM | POA: Diagnosis not present

## 2024-07-30 DIAGNOSIS — C50412 Malignant neoplasm of upper-outer quadrant of left female breast: Secondary | ICD-10-CM | POA: Diagnosis not present

## 2024-07-30 MED ORDER — LETROZOLE 2.5 MG PO TABS: 2.5000 mg | ORAL_TABLET | Freq: Every day | ORAL | 0 refills | Status: DC

## 2024-07-30 NOTE — Progress Notes (Signed)
 HEMATOLOGY-ONCOLOGY TELEPHONE VISIT PROGRESS NOTE  I connected with our patient on 07/30/24 at  3:15 PM EDT by telephone and verified that I am speaking with the correct person using two identifiers.  I discussed the limitations, risks, security and privacy concerns of performing an evaluation and management service by telephone and the availability of in person appointments.  I also discussed with the patient that there may be a patient responsible charge related to this service. The patient expressed understanding and agreed to proceed.   History of Present Illness: Follow-up on anastrozole  therapy  History of Present Illness Jordan Willis is a 73 year old female who presents with gastrointestinal side effects from anastrozole .  She experiences nausea, vomiting, and diarrhea since starting anastrozole . These symptoms are particularly disruptive during social activities, leading to vomiting and urgent bathroom trips. She tolerates diarrhea due to potential weight loss but finds nausea intolerable. Previously, she discontinued exemestane  due to migraines.    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%, PR 0%, HER-2 negative, Ki-67 31%   09/26/2005 - 11/13/2005 Radiation Therapy   Adjuvant radiation therapy   03/18/2008 Initial Biopsy   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   03/22/2008 Surgery   Right breast lumpectomy 0.9 cm DCIS ER 90% PR 98%  stage 0   04/11/2008 Procedure   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   05/10/2008 - 07/02/2008 Radiation Therapy   Adjuvant radiation therapy   07/17/2008 - 04/16/2009 Anti-estrogen oral therapy   Femara  then switched to tamoxifen which was discontinued in 2010 due to concern about uterine cancer   05/31/2021 Cancer Staging   Staging form: Breast, AJCC 7th Edition - Clinical: Stage IA (rT1c, N0, M0) - Signed by Odean Potts, MD on 05/31/2021 Stage prefix: Recurrence Laterality: Right Biopsy of metastatic site performed: No Estrogen receptor status: Positive Progesterone receptor status: Negative HER2 status: Positive   09/12/2021 Cancer Staging   Staging form: Breast, AJCC 7th Edition - Pathologic: Stage IIA (T2, N0, cM0) - Signed by Odean Potts, MD on 09/12/2021 Estrogen receptor status: Positive Progesterone receptor status: Positive HER2 status: Negative   10/05/2022 Relapse/Recurrence   Palpable lump in the right mastectomy scar: 1.4 x 1.3 x 1.3 cm and a 0.4 cm mass (presence of small residual fluid): Biopsy revealed grade 3 IDC with necrosis ER 90% PR 0% Ki-67 70%, HER2 3+ positive   11/06/2022 Imaging   CT chest/abdomen/pelvis Developing small nodule along the right chest wall lateral to the pectoralis muscle inferior to the battery pack of the patient's defibrillator. A malignant lesion is possible. Recommend further workup such as ultrasound.   No developing other new mass lesion, fluid collection or lymph node enlargement.   Persistent sclerosis diffusely along the sternum.   Fatty liver infiltration.   Left-sided renal stone   Aortic atherosclerosis.  ICD 10 code-I70.0    11/06/2022 Imaging   Intense sternal/manubrial radiotracer uptake  corresponding with the expansile lucency and sclerosis seen on CT is compatible with osseous metastatic disease.   No other convincing scintigraphic evidence of osteoblastic metastatic  disease   12/04/2022 Surgery   Right breast mastectomy scar with chest wall mass 3 exertion: Grade 3 poorly differentiated ductal adenocarcinoma with extensive tumor necrosis, margins negative involving subcutis and deep dermis, vascular invasion present.  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 ER 90%, PR 0%, Ki-67 70%, HER2 3+ positive    02/06/2023 -  Chemotherapy   Patient is on Treatment Plan : BREAST MAINTENANCE Trastuzumab  IV (6) or SQ (600) D1 q21d X 11 Cycles     Breast cancer of upper-outer quadrant of right female breast (HCC)  05/23/2021 Relapse/Recurrence   Screening detected right breast mass at 10 o'clock position measuring 1.2 cm, ultrasound-guided biopsy revealed grade 3 IDC ER 20%, PR 0%, HER2 positive 3+, Ki-67 50%   09/04/2021 Surgery   Bilateral mastectomies Left mastectomy: Benign Right mastectomy: Grade 3 IDC with DCIS 3.6 cm, 0/6 lymph nodes negative, ER 20%, PR 0%, HER2 positive by IHC, Ki-67 50%   11/21/2021 - 01/23/2022 Chemotherapy   Patient is on Treatment Plan : BREAST Trastuzumab  q21d     04/2022 -  Anti-estrogen oral therapy   Anastrozole  daily; changed to Exemestane      REVIEW OF SYSTEMS:   Constitutional: Denies fevers, chills or abnormal weight loss All other systems were reviewed with the patient and are negative. Observations/Objective:     Assessment Plan:  Malignant neoplasm of upper-outer quadrant of left breast in female, estrogen receptor negative (HCC) 12/04/2022: Right breast mastectomy scar with chest wall mass 3 exertion: Grade 3 poorly differentiated ductal adenocarcinoma with extensive tumor necrosis, margins negative involving  subcutis and deep dermis, vascular invasion present.  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  Cycle 8 Herceptin  (because of cardiomyopathy, Dr. Cherrie is watching very closely) completed 07/04/2023 (stopped because of cardiac dysfunction EF 20 to 25%)   Signatera: 01/02/2023: Positive 0.07 MTM/ml Signatera 04/17/2023: Negative Signatera 07/06/2023: Negative Signatera March 2025: Negative Signatera September 2025: Negative   Bone scan 08/06/2023: Increased uptake within the sternum and manubrium similar to before.   Diffuse muscle aches and pains: Marked improvement with physical therapy Migraines: Marked improvement since exemestane  was discontinued  Current treatment: Anastrozole  started 07/06/2024 Anastrozole  toxicities: Diarrhea Nausea Adequately discontinuation of anastrozole  therapy and switching to letrozole  I will call her in 1 month to discuss side effects to letrozole  treatment.     I discussed the assessment and treatment plan with the patient. The patient was provided an opportunity to  ask questions and all were answered. The patient agreed with the plan and  demonstrated an understanding of the instructions. The patient was advised to call back or seek an in-person evaluation if the symptoms worsen or if the condition fails to improve as anticipated.   I provided 20 minutes of non-face-to-face time during this encounter.  This includes time for charting and coordination of care   Naomi MARLA Chad, MD

## 2024-07-30 NOTE — Assessment & Plan Note (Signed)
 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  Cycle 8 Herceptin  (because of cardiomyopathy, Dr. Cherrie is watching very closely) completed 07/04/2023 (stopped because of cardiac dysfunction EF 20 to 25%)   Signatera: 01/02/2023: Positive 0.07 MTM/ml Signatera 04/17/2023: Negative Signatera 07/06/2023: Negative Signatera March 2025: Negative Signatera September 2025: Negative   Bone scan 08/06/2023: Increased uptake within the sternum and manubrium similar to before.   Diffuse muscle aches and pains: Marked improvement with physical therapy Migraines: Marked improvement since exemestane  was discontinued  Current treatment: Anastrozole  started 07/06/2024 Anastrozole  toxicities:

## 2024-08-11 ENCOUNTER — Ambulatory Visit

## 2024-08-11 DIAGNOSIS — I428 Other cardiomyopathies: Secondary | ICD-10-CM

## 2024-08-12 DIAGNOSIS — E7800 Pure hypercholesterolemia, unspecified: Secondary | ICD-10-CM | POA: Diagnosis not present

## 2024-08-12 LAB — CUP PACEART REMOTE DEVICE CHECK
Battery Remaining Longevity: 64 mo
Battery Remaining Percentage: 71 %
Battery Voltage: 2.98 V
Brady Statistic AP VP Percent: 3.5 %
Brady Statistic AP VS Percent: 1 %
Brady Statistic AS VP Percent: 96 %
Brady Statistic AS VS Percent: 1 %
Brady Statistic RA Percent Paced: 3.4 %
Date Time Interrogation Session: 20251021000001
HighPow Impedance: 65 Ohm
HighPow Impedance: 65 Ohm
Implantable Lead Connection Status: 753985
Implantable Lead Connection Status: 753985
Implantable Lead Connection Status: 753985
Implantable Lead Implant Date: 20250116
Implantable Lead Implant Date: 20250116
Implantable Lead Implant Date: 20250116
Implantable Lead Location: 753859
Implantable Lead Location: 753860
Implantable Lead Location: 753860
Implantable Pulse Generator Implant Date: 20231103
Lead Channel Impedance Value: 260 Ohm
Lead Channel Impedance Value: 430 Ohm
Lead Channel Impedance Value: 550 Ohm
Lead Channel Pacing Threshold Amplitude: 0.25 V
Lead Channel Pacing Threshold Amplitude: 0.5 V
Lead Channel Pacing Threshold Amplitude: 0.75 V
Lead Channel Pacing Threshold Pulse Width: 0.05 ms
Lead Channel Pacing Threshold Pulse Width: 0.5 ms
Lead Channel Pacing Threshold Pulse Width: 0.5 ms
Lead Channel Sensing Intrinsic Amplitude: 11.9 mV
Lead Channel Sensing Intrinsic Amplitude: 3.3 mV
Lead Channel Setting Pacing Amplitude: 0.25 V
Lead Channel Setting Pacing Amplitude: 2 V
Lead Channel Setting Pacing Amplitude: 2 V
Lead Channel Setting Pacing Pulse Width: 0.05 ms
Lead Channel Setting Pacing Pulse Width: 0.5 ms
Lead Channel Setting Sensing Sensitivity: 0.5 mV
Pulse Gen Serial Number: 5548670
Zone Setting Status: 755011

## 2024-08-13 ENCOUNTER — Ambulatory Visit: Payer: Self-pay | Admitting: Internal Medicine

## 2024-08-14 NOTE — Progress Notes (Signed)
 Remote ICD Transmission

## 2024-08-17 ENCOUNTER — Other Ambulatory Visit: Payer: Self-pay

## 2024-08-19 ENCOUNTER — Telehealth: Payer: Self-pay | Admitting: Cardiology

## 2024-08-19 MED ORDER — POTASSIUM CHLORIDE CRYS ER 20 MEQ PO TBCR: 40.0000 meq | EXTENDED_RELEASE_TABLET | Freq: Every day | ORAL | 3 refills | Status: AC

## 2024-08-19 NOTE — Telephone Encounter (Signed)
 Contacted Insurance Risk Surveyor ( clinical pharmacist at [facility]) . Stacy wanted Dr Elaine  to be aware the patient has been complaining of dizziness and  lightheaded especially getting up from a sitting to standing. This occurs at least weekly for the last 4 months or so.   Per Glade, patient stated she did not informed Dr Michele at last visit 07/24/24. Stacy patient is taking Metolazone   and Bumex  ( which is for patient's heart failure) . Glade was concerned.  Stacy like to know if Dr Michele  would be okay if see  adjust  these  medications.   She states she only works on Wednesdays, we can leave a message  on her voice mail  of Dr Michele response . Glade is aware Dr Elaine is not in the office this week    Patient has not been to [facility] since 2024

## 2024-08-19 NOTE — Telephone Encounter (Signed)
 Thanks for addressing her concerns.  Please stop Metolazone .  Continue Bumex  for now if symptoms continue call us  back for further guidance.  Please update Stacy from [facility] associates as well - as part of close loop communication.   Connell Bognar [place], DO, FACC

## 2024-08-19 NOTE — Telephone Encounter (Signed)
 Pt c/o medication issue:  1. Name of Medication:  Diuretic medication  2. How are you currently taking this medication (dosage and times per day)?   3. Are you having a reaction (difficulty breathing--STAT)?   4. What is your medication issue?    Caller Dorma) stated she wants to adjust some of patient's medication and want a call back to discuss next steps.

## 2024-08-19 NOTE — Telephone Encounter (Signed)
 Call to [facility] at [facility], reviewed that Dr. Michele advises patient to stop metolazone  and continue bumex  only.   Also called patient to discuss, no answer, left detailed message per DPR with this information. MC also sent. Glade states she will also try to contact patient.

## 2024-08-23 ENCOUNTER — Other Ambulatory Visit: Payer: Self-pay | Admitting: Hematology and Oncology

## 2024-08-25 ENCOUNTER — Ambulatory Visit: Payer: 59

## 2024-08-28 ENCOUNTER — Encounter: Payer: Self-pay | Admitting: Hematology and Oncology

## 2024-08-28 ENCOUNTER — Inpatient Hospital Stay: Attending: Hematology and Oncology

## 2024-08-28 NOTE — Progress Notes (Signed)
 [facility] CSW Counseling Note  Patient was referred by self. Treatment type: Individual  Presenting Concerns: Patient and/or family reports the following symptoms/concerns: anxiety and stress Duration of problem: last few years; Severity of problem: mild   Orientation:oriented to person, place, and time/date.   Affect: Congruent Risk of harm to self or others: No plan to harm self or others  Patient and/or Family's Strengths/Protective Factors: Social connections, Social and Emotional competence, Concrete supports in place (healthy food, safe environments, etc.), Sense of purpose, and Physical Health (exercise, healthy diet, medication compliance, etc.)Ability for insight  Active sense of humor  Average or above average intelligence  Capable of independent living  Religious Affiliation      Goals Addressed: Patient will:  Reduce symptoms of: anxiety and stress Increase knowledge and/or ability of: coping skills and stress reduction  Increase healthy adjustment to current life circumstances, Increase adequate support systems for patient/family, and Begin healthy grieving over loss   Progress towards Goals: Initial   Interventions: Interventions utilized:  Mindfulness or Relaxation Training, CBT Cognitive Behavioral Therapy, and Supportive Counseling  GAD 7 Will perform PHQ-9 at next session     03/25/2018    2:09 PM 03/18/2018    4:56 PM 03/11/2018   10:00 PM  PHQ9 SCORE ONLY  PHQ-9 Total Score 0  0  0      Data saved with a previous flowsheet row definition       08/28/2024    3:37 PM  GAD 7 : Generalized Anxiety Score  Nervous, Anxious, on Edge 1  Control/stop worrying 2  Worry too much - different things 2  Trouble relaxing 1  Restless 1  Easily annoyed or irritable 1  Afraid - awful might happen 0  Total GAD 7 Score 8  Anxiety Difficulty Somewhat difficult       Assessment: Patient currently experiencing increased stress due to caregiving responsibilities for her  3 year old mother who lives with her and has dementia, pt's own ongoing health concerns/ chronic pain, and grief for the life she had before cancer, stating cancer has taken so much from me. Patient expressed that the accumulation of challenging life circumstances over many years since first diagnosis in 2008 have negatively impacted her self-esteem and caused difficulties with maintaining healthy boundaries.  Patient spoke of a community program she attends called [employer] that focuses on strategies for increased wellbeing. Intern validated pt's current strategies, such as box breathing, EFT, and taking 15-minute breaks when feeling stressed. Pt and intern explored incorporating time in nature into current coping skills to increase benefits and address attention fatigue. Intern provided active listening and reframed patient's feeling that she should be grateful to include accepting and releasing difficult emotions into gratitude practice, to honor the very real challenges pt faces. Intern affirmed pt's decision to focus on her own healing right now, rather than trying to give from an empty cup, and challenged her to continue setting needed boundaries with family.      Plan: Follow up with CSW: In 2 weeks Behavioral recommendations: Incorporate time in nature into grounding practices; honor difficult emotions, allowing them to flow when they arise, knowing they will pass; practice saying No or asking for help when needed. Referral(s): None at this time.      Thersia KATHEE Daring Clinical Social Work Intern [employer]   Patient is participating in a Managed Medicaid Plan:  Yes

## 2024-08-31 ENCOUNTER — Inpatient Hospital Stay: Admitting: Hematology and Oncology

## 2024-08-31 DIAGNOSIS — Z171 Estrogen receptor negative status [ER-]: Secondary | ICD-10-CM | POA: Diagnosis not present

## 2024-08-31 DIAGNOSIS — C50412 Malignant neoplasm of upper-outer quadrant of left female breast: Secondary | ICD-10-CM

## 2024-08-31 NOTE — Progress Notes (Signed)
 HEMATOLOGY-ONCOLOGY TELEPHONE VISIT PROGRESS NOTE  I connected with our patient on 08/31/24 at  9:30 AM EST by telephone and verified that I am speaking with the correct person using two identifiers.  I discussed the limitations, risks, security and privacy concerns of performing an evaluation and management service by telephone and the availability of in person appointments.  I also discussed with the patient that there may be a patient responsible charge related to this service. The patient expressed understanding and agreed to proceed.   History of Present Illness:  History of Present Illness Jordan Willis is a 73 year old female who presents for follow-up regarding her medication use and post-surgical symptoms.  She experiences no significant side effects from letrozole . She has constant pain in the area under her arm where she had surgery, previously numb, managed with ibuprofen , Tylenol , and occasionally Voltaren gel. Muscle spasms occur, treated with medication when severe. She experienced nausea for the first time yesterday, not believed to be related to her current medication regimen.    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%, PR 0%, HER-2 negative, Ki-67 31%   09/26/2005 - 11/13/2005 Radiation Therapy   Adjuvant radiation therapy   03/18/2008 Initial Biopsy   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   03/22/2008 Surgery   Right breast  lumpectomy 0.9 cm DCIS ER 90% PR 98% stage 0   04/11/2008 Procedure   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   05/10/2008 - 07/02/2008 Radiation Therapy   Adjuvant radiation therapy   07/17/2008 - 04/16/2009 Anti-estrogen oral therapy   Femara  then switched to tamoxifen which was discontinued in 2010 due to concern about uterine cancer   05/31/2021 Cancer Staging   Staging form: Breast, AJCC 7th Edition - Clinical: Stage IA (rT1c, N0, M0) - Signed by Odean Potts, MD on 05/31/2021 Stage prefix: Recurrence Laterality: Right Biopsy of metastatic site performed: No Estrogen receptor status: Positive Progesterone receptor status: Negative HER2 status: Positive   09/12/2021 Cancer Staging   Staging form: Breast, AJCC 7th Edition - Pathologic: Stage IIA (T2, N0, cM0) - Signed by Odean Potts, MD on 09/12/2021 Estrogen receptor status: Positive Progesterone receptor status: Positive HER2 status: Negative   10/05/2022 Relapse/Recurrence   Palpable lump in the right mastectomy scar: 1.4 x 1.3 x 1.3 cm and a 0.4 cm mass (presence of small residual fluid): Biopsy revealed grade 3 IDC with necrosis ER 90% PR 0% Ki-67 70%, HER2 3+ positive   11/06/2022 Imaging   CT chest/abdomen/pelvis Developing small nodule along the right chest wall lateral to the pectoralis muscle inferior to the battery pack of the patient's defibrillator. A malignant lesion is possible. Recommend further workup such as ultrasound.   No developing other new mass lesion, fluid collection or lymph node enlargement.   Persistent sclerosis diffusely along the sternum.   Fatty liver infiltration.   Left-sided renal stone   Aortic atherosclerosis.  ICD 10 code-I70.0    11/06/2022 Imaging   Intense sternal/manubrial radiotracer uptake corresponding with the expansile lucency and sclerosis seen on CT is compatible with osseous metastatic disease.   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 ER 90%, PR 0%, Ki-67 70%, HER2 3+ positive    02/06/2023 -  Chemotherapy   Patient is on Treatment Plan : BREAST MAINTENANCE Trastuzumab  IV (6) or SQ (600) D1 q21d X 11 Cycles     Breast cancer of upper-outer quadrant of right female breast (HCC)  05/23/2021 Relapse/Recurrence   Screening detected right breast mass at 10 o'clock position measuring 1.2 cm, ultrasound-guided biopsy revealed grade 3 IDC ER 20%, PR 0%, HER2 positive 3+, Ki-67 50%   09/04/2021 Surgery   Bilateral mastectomies Left mastectomy: Benign Right mastectomy: Grade 3 IDC with DCIS 3.6 cm, 0/6 lymph nodes negative, ER 20%, PR 0%, HER2 positive by IHC, Ki-67 50%   11/21/2021 - 01/23/2022 Chemotherapy   Patient is on Treatment Plan : BREAST Trastuzumab  q21d     04/2022 -  Anti-estrogen oral therapy   Anastrozole  daily; changed to Exemestane      REVIEW OF SYSTEMS:   Constitutional: Denies fevers, chills or abnormal weight loss All other systems were reviewed with the patient and are negative. Observations/Objective:     Assessment Plan:  Malignant neoplasm of upper-outer quadrant of left breast in female, estrogen receptor negative (HCC) 12/04/2022: Right breast mastectomy scar with chest wall mass 3 exertion: Grade 3 poorly differentiated ductal adenocarcinoma with extensive tumor  necrosis, margins negative involving subcutis and deep dermis, vascular invasion present.  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  Cycle 8 Herceptin  (because of cardiomyopathy, Dr. Cherrie is watching very closely) completed 07/04/2023 (stopped because of cardiac dysfunction EF 20 to 25%)   Signatera: 01/02/2023: Positive 0.07 MTM/ml Signatera 04/17/2023: Negative Signatera 07/06/2023: Negative Signatera March 2025: Negative Signatera September 2025: Negative   Bone scan 08/06/2023: Increased uptake within the sternum and manubrium similar to before.   Diffuse muscle aches and pains: Marked improvement with physical therapy Migraines: no worsening since we switched to Letrozole    Current treatment: Anastrozole  started 07/06/2024 switched to letrozole  07/30/2024 due to diarrhea and nausea  Letrozole  toxicities:  Tolerating this much better without any nausea or diarrhea. Tenderness in the left axilla: Possibly scar tissue related.  I encouraged her to use Voltaren gel or take Tylenol .   She will keep her follow-up appointment in June 2026.  I discussed the assessment and  treatment plan with the patient. The patient was provided an opportunity to ask questions and all were answered. The patient agreed with the plan and demonstrated an understanding of the instructions. The patient was advised to call back or seek an in-person evaluation if the symptoms worsen or if the condition fails to improve as anticipated.   I provided 20 minutes of non-face-to-face time during this encounter.  This includes time for charting and coordination of care   Jordan MARLA Chad, MD

## 2024-08-31 NOTE — Progress Notes (Signed)
 I reviewed patient visit by social work Tax inspector. I concur with the treatment plan as documented in the SW intern's note.   Nazareth Norenberg E Lylianna Fraiser, LCSW Clinical Child psychotherapist

## 2024-08-31 NOTE — Assessment & Plan Note (Signed)
 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  Cycle 8 Herceptin  (because of cardiomyopathy, Dr. Cherrie is watching very closely) completed 07/04/2023 (stopped because of cardiac dysfunction EF 20 to 25%)   Signatera: 01/02/2023: Positive 0.07 MTM/ml Signatera 04/17/2023: Negative Signatera 07/06/2023: Negative Signatera March 2025: Negative Signatera September 2025: Negative   Bone scan 08/06/2023: Increased uptake within the sternum and manubrium similar to before.   Diffuse muscle aches and pains: Marked improvement with physical therapy Migraines: Marked improvement since exemestane  was discontinued   Current treatment: Anastrozole  started 07/06/2024 switched to letrozole  07/30/2024 due to diarrhea and nausea  Letrozole  toxicities:  I will call her in 1 month to discuss side effects to letrozole  treatment.

## 2024-09-08 ENCOUNTER — Ambulatory Visit (HOSPITAL_COMMUNITY)
Admission: RE | Admit: 2024-09-08 | Discharge: 2024-09-08 | Disposition: A | Discharge status: Home / Self Care | Source: Ambulatory Visit | Attending: Cardiology | Admitting: Cardiology

## 2024-09-08 DIAGNOSIS — I5022 Chronic systolic (congestive) heart failure: Secondary | ICD-10-CM | POA: Diagnosis present

## 2024-09-08 DIAGNOSIS — I428 Other cardiomyopathies: Secondary | ICD-10-CM | POA: Diagnosis present

## 2024-09-08 LAB — ECHOCARDIOGRAM COMPLETE
Area-P 1/2: 3.07 cm2
Calc EF: 52.9 %
S' Lateral: 3.33 cm
Single Plane A2C EF: 50.7 %
Single Plane A4C EF: 53.7 %

## 2024-09-09 ENCOUNTER — Inpatient Hospital Stay

## 2024-09-09 NOTE — Progress Notes (Signed)
 [facility] CSW Counseling Note  Patient was referred by self. Treatment type: Individual  Presenting Concerns: Patient and/or family reports the following symptoms/concerns: anxiety and stress Duration of problem: last few years; Severity of problem: mild   Orientation:oriented to person, place, and time/date.   Affect: Congruent Risk of harm to self or others: No plan to harm self or others  Patient and/or Family's Strengths/Protective Factors: Social connections, Social and Emotional competence, Concrete supports in place (healthy food, safe environments, etc.), Sense of purpose, and Physical Health (exercise, healthy diet, medication compliance, etc.)Ability for insight  Active sense of humor  Average or above average intelligence  Capable of independent living  Religious Affiliation      Goals Addressed: Patient will:  Reduce symptoms of: anxiety and stress Increase knowledge and/or ability of: coping skills and stress reduction  Increase healthy adjustment to current life circumstances, Increase adequate support systems for patient/family, and Begin healthy grieving over loss   Progress towards Goals: Progressing   Interventions: Interventions utilized:  Solution-Focused Strategies, Behavioral Activation, CBT Cognitive Behavioral Therapy, and Supportive Counseling      03/25/2018    2:09 PM 03/18/2018    4:56 PM 03/11/2018   10:00 PM  PHQ9 SCORE ONLY  PHQ-9 Total Score 0  0  0      Data saved with a previous flowsheet row definition       08/28/2024    3:37 PM  GAD 7 : Generalized Anxiety Score  Nervous, Anxious, on Edge 1  Control/stop worrying 2  Worry too much - different things 2  Trouble relaxing 1  Restless 1  Easily annoyed or irritable 1  Afraid - awful might happen 0  Total GAD 7 Score 8  Anxiety Difficulty Somewhat difficult       Assessment: Patient reports challenging interpersonal dynamics which revolve around pleasing others at her own expense. CSW  Intern helped patient process what it feels like to say 'no' even when it disappoints others, and where the need to please others come from. Intern reminded pt of the goal stated in previous session of prioritizing her needs, wants, and healing, and encouraged pt to try setting specific boundaries this week that patient named in session. Patient and Intern discussed how saying no to someone may feel bad in the moment, but can ultimately have a positive impact on relationship with self and others.  Patient continued to reflect on the many challenges brought on by her illness/ multiple recurrences and surgeries since 2008, stating that she has learned and found the good in all of her experiences, including cancer. Intern affirmed pt's lifelong resilience and ability to use the strength she has gained to continue healing.     Plan: Follow up with CSW: In 2 weeks Behavioral recommendations: Address specific goals created in session to set boundaries and ask for help. Referral(s): Next breast cancer support group 12/2.      Jordan Willis Clinical Social Work Intern [employer]   Patient is participating in a Managed Medicaid Plan:  Yes

## 2024-09-09 NOTE — Progress Notes (Signed)
 I reviewed patient visit by social work Tax inspector. I concur with the treatment plan as documented in the SW intern's note.   Nazareth Norenberg E Lylianna Fraiser, LCSW Clinical Child psychotherapist

## 2024-09-18 ENCOUNTER — Ambulatory Visit: Payer: Self-pay | Admitting: Cardiology

## 2024-09-23 ENCOUNTER — Inpatient Hospital Stay: Attending: Hematology and Oncology

## 2024-09-23 NOTE — Progress Notes (Signed)
 [facility] CSW Counseling Note  Patient was referred by self. Treatment type: Individual  Presenting Concerns: Patient and/or family reports the following symptoms/concerns: anxiety and stress Duration of problem: last few years; Severity of problem: mild   Orientation:oriented to person, place, and time/date.   Affect: Congruent Risk of harm to self or others: No plan to harm self or others  Patient and/or Family's Strengths/Protective Factors: Social connections, Social and Emotional competence, Concrete supports in place (healthy food, safe environments, etc.), Sense of purpose, and Physical Health (exercise, healthy diet, medication compliance, etc.)Ability for insight  Active sense of humor  Average or above average intelligence  Capable of independent living  Religious Affiliation      Goals Addressed: Patient will:  Reduce symptoms of: anxiety and stress Increase knowledge and/or ability of: coping skills and stress reduction  Increase healthy adjustment to current life circumstances, Increase adequate support systems for patient/family, and Begin healthy grieving over loss   Progress towards Goals: Progressing   Interventions: Interventions utilized:  Solution-Focused Strategies, Behavioral Activation, CBT Cognitive Behavioral Therapy, and Supportive Counseling      03/25/2018    2:09 PM 03/18/2018    4:56 PM 03/11/2018   10:00 PM  PHQ9 SCORE ONLY  PHQ-9 Total Score 0  0  0      Data saved with a previous flowsheet row definition       08/28/2024    3:37 PM  GAD 7 : Generalized Anxiety Score  Nervous, Anxious, on Edge 1  Control/stop worrying 2  Worry too much - different things 2  Trouble relaxing 1  Restless 1  Easily annoyed or irritable 1  Afraid - awful might happen 0  Total GAD 7 Score 8  Anxiety Difficulty Somewhat difficult       Assessment:  Patient shared 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 CSW Intern offered empathetic listening and validated the difficulty of juggling so much, noting pt's progress in this area within a short time. Patient and Intern explored pt's values and how her actions are/ can be in alignment. Intern assisted pt in processing complex grief with how caregiving relates to childhood experiences.   Patient has gained many new self-care tools through [employer] program and Intern assisted in identifying practical ways they can be implemented into daily life.     Plan: Follow up with CSW: In 2 weeks Behavioral recommendations: Write down values to refer to when in need of grounding; continue to practice coping skills daily: box breathing, exercise/ time in nature, boundary setting. Referral(s): Next breast cancer support group.      Thersia KATHEE Daring Clinical Social Work Intern [employer]   Patient is participating in a Managed Medicaid Plan:  Yes

## 2024-09-24 ENCOUNTER — Other Ambulatory Visit: Payer: Self-pay | Admitting: Cardiology

## 2024-09-24 NOTE — Progress Notes (Signed)
 I reviewed patient visit by social work Tax inspector. I concur with the treatment plan as documented in the SW intern's note.   Nazareth Norenberg E Lylianna Fraiser, LCSW Clinical Child psychotherapist

## 2024-09-30 MED ORDER — BUMETANIDE 1 MG PO TABS: 1.0000 mg | ORAL_TABLET | Freq: Every day | ORAL | 2 refills | Status: AC

## 2024-10-01 ENCOUNTER — Inpatient Hospital Stay

## 2024-10-01 NOTE — Progress Notes (Signed)
 [facility] CSW Progress Note  Clinical Social Work Intern contacted patient by phone to follow-up on emotional support and to schedule counseling.    Interventions: Provided brief mental health counseling with regard to stresses of caregiving for her mother and seeking balance with own activities and self-care.  Invited patient to attend next [employer] (group therapeutic horticulture class) at [employer] on 12/12.      Follow Up Plan:  CSW will see patient on 12/17 for next counseling appointment.    Thersia KATHEE Daring Clinical Social Work Intern [employer]    Patient is participating in a Managed Medicaid Plan:  Yes

## 2024-10-06 ENCOUNTER — Other Ambulatory Visit: Payer: Self-pay | Admitting: Cardiology

## 2024-10-06 DIAGNOSIS — I10 Essential (primary) hypertension: Secondary | ICD-10-CM

## 2024-10-06 DIAGNOSIS — I429 Cardiomyopathy, unspecified: Secondary | ICD-10-CM

## 2024-10-06 DIAGNOSIS — I428 Other cardiomyopathies: Secondary | ICD-10-CM

## 2024-10-06 DIAGNOSIS — I5042 Chronic combined systolic (congestive) and diastolic (congestive) heart failure: Secondary | ICD-10-CM

## 2024-10-06 MED ORDER — SPIRONOLACTONE 25 MG PO TABS: 25.0000 mg | ORAL_TABLET | Freq: Every day | ORAL | 3 refills | Status: AC

## 2024-10-07 ENCOUNTER — Inpatient Hospital Stay

## 2024-10-20 ENCOUNTER — Other Ambulatory Visit: Payer: Self-pay

## 2024-10-20 DIAGNOSIS — I428 Other cardiomyopathies: Secondary | ICD-10-CM

## 2024-10-21 MED ORDER — METOPROLOL SUCCINATE ER 25 MG PO TB24: 25.0000 mg | ORAL_TABLET | Freq: Every day | ORAL | 3 refills | Status: AC

## 2024-11-06 ENCOUNTER — Inpatient Hospital Stay: Attending: Hematology and Oncology

## 2024-11-06 NOTE — Progress Notes (Signed)
 [facility] CSW Counseling Note  Patient was referred by self. Treatment type: Individual  Presenting Concerns: Patient and/or family reports the following symptoms/concerns: anxiety and stress Duration of problem: last few years; Severity of problem: mild   Orientation:oriented to person, place, and time/date.   Affect: Congruent Risk of harm to self or others: No plan to harm self or others  Patient and/or Family's Strengths/Protective Factors: Social connections, Social and Emotional competence, Concrete supports in place (healthy food, safe environments, etc.), Sense of purpose, and Physical Health (exercise, healthy diet, medication compliance, etc.)Ability for insight  Active sense of humor  Average or above average intelligence  Capable of independent living  Religious Affiliation      Goals Addressed: Patient will:  Reduce symptoms of: anxiety and stress Increase knowledge and/or ability of: coping skills and stress reduction  Increase healthy adjustment to current life circumstances, Increase adequate support systems for patient/family, and Begin healthy grieving over loss   Progress towards Goals: Progressing   Interventions: Interventions utilized:  Solution-Focused Strategies, Supportive Counseling, and ACT (Acceptance and Commitment Therapy)      03/25/2018    2:09 PM 03/18/2018    4:56 PM 03/11/2018   10:00 PM  PHQ9 SCORE ONLY  PHQ-9 Total Score 0  0  0      Data saved with a previous flowsheet row definition       08/28/2024    3:37 PM  GAD 7 : Generalized Anxiety Score  Nervous, Anxious, on Edge 1  Control/stop worrying 2  Worry too much - different things 2  Trouble relaxing 1  Restless 1  Easily annoyed or irritable 1  Afraid - awful might happen 0  Total GAD 7 Score 8  Anxiety Difficulty Somewhat difficult       Assessment:  Patient continues to struggle with 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 CSW Intern provided supportive listening and affirmation of patient's strengths. Patient named goals she has but feels stuck in accomplishing due to own health challenges and caregiving responsibilities. Patient and intern broke down goals into small action steps, using the analogy of planting a seed which starts out small but grows over time.    Plan: Follow up with CSW: In 2 weeks Behavioral recommendations: Daily writing practice for 10-15 minutes; plan overnight trip with friend, and ask family for support caring for mother during this time. Referral(s): Sports Coach training       Thersia KATHEE Daring Clinical Social Work Intern [employer]   Patient is participating in a Managed Medicaid Plan:  Yes

## 2024-11-06 NOTE — Progress Notes (Signed)
 I reviewed patient visit by social work Tax inspector. I concur with the treatment plan as documented in the SW intern's note.   Nazareth Norenberg E Lylianna Fraiser, LCSW Clinical Child psychotherapist

## 2024-11-10 ENCOUNTER — Ambulatory Visit

## 2024-11-10 DIAGNOSIS — I428 Other cardiomyopathies: Secondary | ICD-10-CM | POA: Diagnosis not present

## 2024-11-11 LAB — CUP PACEART REMOTE DEVICE CHECK
Battery Remaining Longevity: 60 mo
Battery Remaining Percentage: 68 %
Battery Voltage: 2.96 V
Brady Statistic AP VP Percent: 4.6 %
Brady Statistic AP VS Percent: 1 %
Brady Statistic AS VP Percent: 95 %
Brady Statistic AS VS Percent: 1 %
Brady Statistic RA Percent Paced: 4.5 %
Date Time Interrogation Session: 20260120020017
HighPow Impedance: 62 Ohm
HighPow Impedance: 62 Ohm
Implantable Lead Connection Status: 753985
Implantable Lead Connection Status: 753985
Implantable Lead Connection Status: 753985
Implantable Lead Implant Date: 20250116
Implantable Lead Implant Date: 20250116
Implantable Lead Implant Date: 20250116
Implantable Lead Location: 753859
Implantable Lead Location: 753860
Implantable Lead Location: 753860
Implantable Pulse Generator Implant Date: 20231103
Lead Channel Impedance Value: 250 Ohm
Lead Channel Impedance Value: 380 Ohm
Lead Channel Impedance Value: 510 Ohm
Lead Channel Pacing Threshold Amplitude: 0.25 V
Lead Channel Pacing Threshold Amplitude: 0.5 V
Lead Channel Pacing Threshold Amplitude: 0.75 V
Lead Channel Pacing Threshold Pulse Width: 0.05 ms
Lead Channel Pacing Threshold Pulse Width: 0.5 ms
Lead Channel Pacing Threshold Pulse Width: 0.5 ms
Lead Channel Sensing Intrinsic Amplitude: 11.9 mV
Lead Channel Sensing Intrinsic Amplitude: 3.5 mV
Lead Channel Setting Pacing Amplitude: 0.25 V
Lead Channel Setting Pacing Amplitude: 2 V
Lead Channel Setting Pacing Amplitude: 2 V
Lead Channel Setting Pacing Pulse Width: 0.05 ms
Lead Channel Setting Pacing Pulse Width: 0.5 ms
Lead Channel Setting Sensing Sensitivity: 0.5 mV
Pulse Gen Serial Number: 5548670
Zone Setting Status: 755011

## 2024-11-13 NOTE — Progress Notes (Signed)
 Remote ICD Transmission

## 2024-11-15 ENCOUNTER — Ambulatory Visit: Payer: Self-pay | Admitting: Cardiology

## 2024-11-24 ENCOUNTER — Ambulatory Visit: Payer: 59

## 2024-11-27 ENCOUNTER — Inpatient Hospital Stay: Attending: Hematology and Oncology

## 2024-11-27 NOTE — Progress Notes (Signed)
 [facility] CSW Counseling Note  Patient was referred by self. Treatment type: Individual  Presenting Concerns: Patient and/or family reports the following symptoms/concerns: anxiety and stress Duration of problem: last few years; Severity of problem: mild   Orientation:oriented to person, place, and time/date.   Affect: Congruent Risk of harm to self or others: No plan to harm self or others  Patient and/or Family's Strengths/Protective Factors: Social connections, Social and Emotional competence, Concrete supports in place (healthy food, safe environments, etc.), Sense of purpose, and Physical Health (exercise, healthy diet, medication compliance, etc.)Ability for insight  Active sense of humor  Average or above average intelligence  Capable of independent living  Religious Affiliation      Goals Addressed: Patient will:  Reduce symptoms of: anxiety and stress Increase knowledge and/or ability of: coping skills and stress reduction  Increase healthy adjustment to current life circumstances, Increase adequate support systems for patient/family, and Begin healthy grieving over loss   Progress towards Goals: Progressing   Interventions: Interventions utilized:  Solution-Focused Strategies, CBT Cognitive Behavioral Therapy, and ACT (Acceptance and Commitment Therapy)      03/25/2018    2:09 PM 03/18/2018    4:56 PM 03/11/2018   10:00 PM  PHQ9 SCORE ONLY  PHQ-9 Total Score 0  0  0      Data saved with a previous flowsheet row definition       08/28/2024    3:37 PM  GAD 7 : Generalized Anxiety Score  Nervous, Anxious, on Edge 1   Control/stop worrying 2   Worry too much - different things 2   Trouble relaxing 1   Restless 1   Easily annoyed or irritable 1   Afraid - awful might happen 0   Total GAD 7 Score 8  Anxiety Difficulty Somewhat difficult     Data saved with a previous flowsheet row definition       Assessment:  Patient is experiencing ongoing elevated stress  related to caring full time for her 80 year old mother who has dementia, while also dealing with her own health challenges. Patient and Intern used cognitive restructuring with the thought This is hard, so I must not be doing it right and accompanying feelings of guilt, and found that evidence does not support accuracy of the thought. Replaced with more accurate thought: Even though this is hard, I am doing my best and brainstomed ways for patient to reset in high stress moments.  Patient has been using journaling, music, and reading as positive coping skills, and setting boundaries to protect the little time she does have free from care giving. Patient and Intern problem solved ways patient can tap into additional supports to provide her with more time to care for herself.     Plan: Follow up with CSW: In 2 weeks Behavioral recommendations: Follow up with mother's PCP regarding referral to Hospice for in-home support/ respite care; use healthy coping tools to release anger/ frustration through physical activity; continue use of existing coping skills. Referral(s): [employer]- Caregiver support groups      Thersia KATHEE Daring Clinical Social Work Intern [employer]   Patient is participating in a Managed Medicaid Plan:  Yes

## 2024-12-11 ENCOUNTER — Inpatient Hospital Stay

## 2025-02-09 ENCOUNTER — Encounter

## 2025-02-23 ENCOUNTER — Ambulatory Visit: Payer: 59

## 2025-03-29 ENCOUNTER — Inpatient Hospital Stay: Attending: Hematology and Oncology | Admitting: Hematology and Oncology

## 2025-05-11 ENCOUNTER — Encounter

## 2025-05-25 ENCOUNTER — Ambulatory Visit: Payer: 59

## 2025-08-10 ENCOUNTER — Encounter

## 2025-08-24 ENCOUNTER — Ambulatory Visit: Payer: 59

## 2025-11-09 ENCOUNTER — Encounter

## 2025-11-23 ENCOUNTER — Ambulatory Visit: Payer: 59

## 2026-02-08 ENCOUNTER — Encounter

## 2026-02-22 ENCOUNTER — Ambulatory Visit: Payer: 59

## 2026-05-24 ENCOUNTER — Ambulatory Visit: Payer: 59
